# Patient Record
Sex: Male | Born: 1945
Health system: Southern US, Community
[De-identification: ages and names within clinical notes are randomized; demographics above are authoritative.]

## PROBLEM LIST (undated history)

## (undated) DIAGNOSIS — IMO0001 Reserved for inherently not codable concepts without codable children: Secondary | ICD-10-CM

## (undated) DIAGNOSIS — R740 Nonspecific elevation of levels of transaminase and lactic acid dehydrogenase [LDH]: Secondary | ICD-10-CM

## (undated) DIAGNOSIS — E119 Type 2 diabetes mellitus without complications: Secondary | ICD-10-CM

## (undated) DIAGNOSIS — N184 Chronic kidney disease, stage 4 (severe): Secondary | ICD-10-CM

## (undated) DIAGNOSIS — Z794 Long term (current) use of insulin: Secondary | ICD-10-CM

## (undated) DIAGNOSIS — E871 Hypo-osmolality and hyponatremia: Secondary | ICD-10-CM

## (undated) DIAGNOSIS — N179 Acute kidney failure, unspecified: Secondary | ICD-10-CM

## (undated) DIAGNOSIS — S42293A Other displaced fracture of upper end of unspecified humerus, initial encounter for closed fracture: Secondary | ICD-10-CM

## (undated) DIAGNOSIS — R4182 Altered mental status, unspecified: Secondary | ICD-10-CM

## (undated) DIAGNOSIS — I219 Acute myocardial infarction, unspecified: Secondary | ICD-10-CM

## (undated) DIAGNOSIS — E278 Other specified disorders of adrenal gland: Secondary | ICD-10-CM

## (undated) DIAGNOSIS — I255 Ischemic cardiomyopathy: Secondary | ICD-10-CM

## (undated) DIAGNOSIS — N189 Chronic kidney disease, unspecified: Secondary | ICD-10-CM

## (undated) DIAGNOSIS — L0390 Cellulitis, unspecified: Secondary | ICD-10-CM

## (undated) DIAGNOSIS — E1165 Type 2 diabetes mellitus with hyperglycemia: Secondary | ICD-10-CM

## (undated) DIAGNOSIS — Z9861 Coronary angioplasty status: Secondary | ICD-10-CM

## (undated) DIAGNOSIS — I2510 Atherosclerotic heart disease of native coronary artery without angina pectoris: Secondary | ICD-10-CM

## (undated) DIAGNOSIS — I10 Essential (primary) hypertension: Secondary | ICD-10-CM

## (undated) DIAGNOSIS — E1129 Type 2 diabetes mellitus with other diabetic kidney complication: Secondary | ICD-10-CM

## (undated) DIAGNOSIS — E785 Hyperlipidemia, unspecified: Secondary | ICD-10-CM

## (undated) DIAGNOSIS — C801 Malignant (primary) neoplasm, unspecified: Secondary | ICD-10-CM

## (undated) DIAGNOSIS — D638 Anemia in other chronic diseases classified elsewhere: Secondary | ICD-10-CM

## (undated) DIAGNOSIS — L03115 Cellulitis of right lower limb: Secondary | ICD-10-CM

## (undated) DIAGNOSIS — I639 Cerebral infarction, unspecified: Secondary | ICD-10-CM

## (undated) DIAGNOSIS — I5042 Chronic combined systolic (congestive) and diastolic (congestive) heart failure: Secondary | ICD-10-CM

## (undated) HISTORY — DX: Hypo-osmolality and hyponatremia: E87.1

## (undated) HISTORY — DX: Chronic kidney disease, stage 4 (severe): N18.4

## (undated) HISTORY — DX: Acute kidney failure, unspecified: N17.9

## (undated) HISTORY — DX: Acute myocardial infarction, unspecified: I21.9

## (undated) HISTORY — DX: Other displaced fracture of upper end of unspecified humerus, initial encounter for closed fracture: S42.293A

## (undated) HISTORY — DX: Ischemic cardiomyopathy: I25.5

## (undated) HISTORY — DX: Chronic combined systolic (congestive) and diastolic (congestive) heart failure: I50.42

## (undated) HISTORY — DX: Atherosclerotic heart disease of native coronary artery without angina pectoris: I25.10

## (undated) HISTORY — DX: Nonspecific elevation of levels of transaminase and lactic acid dehydrogenase (ldh): R74.0

## (undated) HISTORY — DX: Anemia in other chronic diseases classified elsewhere: D63.8

## (undated) HISTORY — DX: Coronary angioplasty status: Z98.61

## (undated) HISTORY — PX: SKIN CANCER EXCISION: SHX779

## (undated) HISTORY — DX: Type 2 diabetes mellitus with hyperglycemia: E11.65

## (undated) HISTORY — DX: Altered mental status, unspecified: R41.82

## (undated) HISTORY — DX: Chronic kidney disease, unspecified: N18.9

## (undated) HISTORY — DX: Essential (primary) hypertension: I10

---

## 2013-01-21 ENCOUNTER — Encounter (HOSPITAL_COMMUNITY): Payer: Self-pay | Admitting: Emergency Medicine

## 2013-01-21 ENCOUNTER — Emergency Department (HOSPITAL_COMMUNITY): Payer: Medicare Other

## 2013-01-21 ENCOUNTER — Emergency Department (HOSPITAL_COMMUNITY)
Admission: EM | Admit: 2013-01-21 | Discharge: 2013-01-21 | Disposition: A | Discharge status: Home / Self Care | Payer: Medicare Other | Attending: Emergency Medicine | Admitting: Emergency Medicine

## 2013-01-21 DIAGNOSIS — Z794 Long term (current) use of insulin: Secondary | ICD-10-CM | POA: Insufficient documentation

## 2013-01-21 DIAGNOSIS — R296 Repeated falls: Secondary | ICD-10-CM | POA: Insufficient documentation

## 2013-01-21 DIAGNOSIS — S42201A Unspecified fracture of upper end of right humerus, initial encounter for closed fracture: Secondary | ICD-10-CM

## 2013-01-21 DIAGNOSIS — Y929 Unspecified place or not applicable: Secondary | ICD-10-CM | POA: Insufficient documentation

## 2013-01-21 DIAGNOSIS — Z79899 Other long term (current) drug therapy: Secondary | ICD-10-CM | POA: Insufficient documentation

## 2013-01-21 DIAGNOSIS — I10 Essential (primary) hypertension: Secondary | ICD-10-CM | POA: Insufficient documentation

## 2013-01-21 DIAGNOSIS — I252 Old myocardial infarction: Secondary | ICD-10-CM | POA: Insufficient documentation

## 2013-01-21 DIAGNOSIS — Z859 Personal history of malignant neoplasm, unspecified: Secondary | ICD-10-CM | POA: Insufficient documentation

## 2013-01-21 DIAGNOSIS — Y939 Activity, unspecified: Secondary | ICD-10-CM | POA: Insufficient documentation

## 2013-01-21 DIAGNOSIS — Z7982 Long term (current) use of aspirin: Secondary | ICD-10-CM | POA: Insufficient documentation

## 2013-01-21 DIAGNOSIS — E119 Type 2 diabetes mellitus without complications: Secondary | ICD-10-CM | POA: Insufficient documentation

## 2013-01-21 DIAGNOSIS — Z8673 Personal history of transient ischemic attack (TIA), and cerebral infarction without residual deficits: Secondary | ICD-10-CM | POA: Insufficient documentation

## 2013-01-21 DIAGNOSIS — S42209A Unspecified fracture of upper end of unspecified humerus, initial encounter for closed fracture: Secondary | ICD-10-CM | POA: Insufficient documentation

## 2013-01-21 HISTORY — DX: Malignant (primary) neoplasm, unspecified: C80.1

## 2013-01-21 HISTORY — DX: Cerebral infarction, unspecified: I63.9

## 2013-01-21 LAB — GLUCOSE, CAPILLARY: Glucose-Capillary: 172 mg/dL — ABNORMAL HIGH (ref 70–99)

## 2013-01-21 MED ORDER — OXYCODONE-ACETAMINOPHEN 5-325 MG PO TABS: 1.0000 | ORAL_TABLET | Freq: Four times a day (QID) | ORAL | Status: DC | PRN

## 2013-01-21 MED ORDER — OXYCODONE-ACETAMINOPHEN 5-325 MG PO TABS
1.0000 | ORAL_TABLET | Freq: Once | ORAL | Status: AC
  Administered 2013-01-21: 1 via ORAL
  Filled 2013-01-21: qty 1

## 2013-01-21 NOTE — ED Notes (Signed)
Per pt, got knocked off porch by dog-landed on right side-right arm, shoulder pain

## 2013-01-21 NOTE — ED Provider Notes (Signed)
CSN: [id number]     Arrival date & time 01/21/13  4696 History   First MD Initiated Contact with Patient 01/21/13 0802     Chief Complaint  Patient presents with  . right shoulder pain    (Consider location/radiation/quality/duration/timing/severity/associated sxs/prior Treatment) HPI Pt presents with c/o right shoulder pain after fall.  He states he was outside on his porch, his dog ran up and was playing and knocked him over. He landed on the right side and now has pain with movement and palpation of anterior shoulder.  Pt states he has prior stroke on right sided and no new weakness.  Denies striking his head.  Denies neck or back pain.  Has had no treatment prior to arrival.  There are no other associated systemic symptoms, there are no other alleviating or modifying factors.   Past Medical History  Diagnosis Date  . Diabetes mellitus without complication   . Hypertension   . Stroke   . MI, old     years ago  . Cancer    History reviewed. No pertinent past surgical history. No family history on file. History  Substance Use Topics  . Smoking status: Never Smoker   . Smokeless tobacco: Not on file  . Alcohol Use: No    Review of Systems ROS reviewed and all otherwise negative except for mentioned in HPI  Allergies  Review of patient's allergies indicates no known allergies.  Home Medications   Current Outpatient Rx  Name  Route  Sig  Dispense  Refill  . ALPRAZolam (XANAX) 0.5 MG tablet   Oral   Take 0.5 mg by mouth 3 (three) times daily as needed for anxiety.         [name] amLODipine (NORVASC) 10 MG tablet   Oral   Take 10 mg by mouth daily.         [name] aspirin EC 81 MG tablet   Oral   Take 81 mg by mouth daily.         [name] atorvastatin (LIPITOR) 80 MG tablet   Oral   Take 80 mg by mouth at bedtime.         . gabapentin (NEURONTIN) 300 MG capsule   Oral   Take 300 mg by mouth 2 (two) times daily.         . hydrochlorothiazide (HYDRODIURIL) 25 MG  tablet   Oral   Take 25 mg by mouth daily.         . insulin aspart (NOVOLOG) 100 UNIT/ML injection   Subcutaneous   Inject 12-20 Units into the skin 3 (three) times daily before meals.         . insulin glargine (LANTUS) 100 UNIT/ML injection   Subcutaneous   Inject 50 Units into the skin 2 (two) times daily.         [name] lisinopril (PRINIVIL,ZESTRIL) 40 MG tablet   Oral   Take 40 mg by mouth daily.         . primidone (MYSOLINE) 50 MG tablet   Oral   Take 100 mg by mouth daily.         [name] oxyCODONE-acetaminophen (PERCOCET/ROXICET) 5-325 MG per tablet   Oral   Take 1-2 tablets by mouth every 6 (six) hours as needed for severe pain.   20 tablet   0    BP 139/53  Pulse 55  Temp(Src) 98.3 F (36.8 C) (Oral)  Resp 16  SpO2 95% Physical Exam Physical Examination: General appearance - alert,  well appearing, and in no distress Mental status - alert, oriented to person, place, and time Eyes - no conjunctival injection, no scleral icterus Mouth - mucous membranes moist, pharynx normal without lesions Chest - clear to auscultation, no wheezes, rales or rhonchi, symmetric air entry Heart - normal rate, regular rhythm, normal S1, S2, no murmurs, rubs, clicks or gallops Abdomen - soft, nontender, nondistended, no masses or organomegaly Neurological - alert, oriented x 3, cranial nerves grossly intact, strength 4/5 in right upper extremity- limited by pain Musculoskeletal - ttp over anterior right shoulder, pain with minimal range of motion, otherwise no joint tenderness, deformity or swelling Extremities - peripheral pulses normal, no pedal edema, no clubbing or cyanosis, right Upper extremity distally NVI Skin - normal coloration and turgor, no rashes  ED Course  Procedures (including critical care time) Labs Review Labs Reviewed  GLUCOSE, CAPILLARY - Abnormal; Notable for the following:    Glucose-Capillary 172 (*)    All other components within normal limits    Imaging Review Dg Shoulder Right  01/21/2013   CLINICAL DATA:  Fall.  Pain.  EXAM: RIGHT SHOULDER - 2+ VIEW  COMPARISON:  None.  FINDINGS: There is a comminuted fracture of the humeral head and neck. There are intra-articular fragments. No fracture of the glenoid, scapula or clavicle.  IMPRESSION: Comminuted fracture of the humeral head and neck with intra-articular fragments.   Electronically Signed   By: Paulina Fusi M.D.   On: 01/21/2013 09:00   Dg Elbow Complete Right  01/21/2013   CLINICAL DATA:  Fall.  Pain.  EXAM: RIGHT ELBOW - COMPLETE 3+ VIEW  COMPARISON:  None.  FINDINGS: No fluid in the joint. No evidence of fracture or other focal lesion.  IMPRESSION: Negative   Electronically Signed   By: Paulina Fusi M.D.   On: 01/21/2013 09:00    EKG Interpretation   None       MDM   1. Proximal humerus fracture, right, closed, initial encounter    Pt presenting with pain in right shoulder after mechanical fall today.  Xray shows proximal humerus fracture- xray images reviewed and interpreted by me as well.  Pt placed in sling, given po pain meds.  Given information for orthopedic f/u.  Discharged with strict return precautions.  Pt agreeable with plan.    Ethelda Chick, MD 01/21/13 [phone]

## 2013-01-24 ENCOUNTER — Inpatient Hospital Stay (HOSPITAL_COMMUNITY)
Admission: EM | Admit: 2013-01-24 | Discharge: 2013-02-01 | DRG: 682 | Disposition: A | Discharge status: Skilled Nursing Facility | Payer: Medicare Other | Attending: Internal Medicine | Admitting: Internal Medicine

## 2013-01-24 ENCOUNTER — Emergency Department (HOSPITAL_COMMUNITY): Payer: Medicare Other

## 2013-01-24 ENCOUNTER — Encounter (HOSPITAL_COMMUNITY): Payer: Self-pay | Admitting: Emergency Medicine

## 2013-01-24 DIAGNOSIS — I129 Hypertensive chronic kidney disease with stage 1 through stage 4 chronic kidney disease, or unspecified chronic kidney disease: Secondary | ICD-10-CM | POA: Diagnosis present

## 2013-01-24 DIAGNOSIS — R7402 Elevation of levels of lactic acid dehydrogenase (LDH): Secondary | ICD-10-CM | POA: Diagnosis present

## 2013-01-24 DIAGNOSIS — E1142 Type 2 diabetes mellitus with diabetic polyneuropathy: Secondary | ICD-10-CM | POA: Diagnosis present

## 2013-01-24 DIAGNOSIS — Z79899 Other long term (current) drug therapy: Secondary | ICD-10-CM

## 2013-01-24 DIAGNOSIS — R7401 Elevation of levels of liver transaminase levels: Secondary | ICD-10-CM | POA: Diagnosis present

## 2013-01-24 DIAGNOSIS — E871 Hypo-osmolality and hyponatremia: Secondary | ICD-10-CM | POA: Diagnosis present

## 2013-01-24 DIAGNOSIS — Z8673 Personal history of transient ischemic attack (TIA), and cerebral infarction without residual deficits: Secondary | ICD-10-CM

## 2013-01-24 DIAGNOSIS — K5900 Constipation, unspecified: Secondary | ICD-10-CM | POA: Diagnosis not present

## 2013-01-24 DIAGNOSIS — E1149 Type 2 diabetes mellitus with other diabetic neurological complication: Secondary | ICD-10-CM | POA: Diagnosis present

## 2013-01-24 DIAGNOSIS — I509 Heart failure, unspecified: Secondary | ICD-10-CM | POA: Diagnosis present

## 2013-01-24 DIAGNOSIS — S42293A Other displaced fracture of upper end of unspecified humerus, initial encounter for closed fracture: Secondary | ICD-10-CM

## 2013-01-24 DIAGNOSIS — I252 Old myocardial infarction: Secondary | ICD-10-CM

## 2013-01-24 DIAGNOSIS — I5033 Acute on chronic diastolic (congestive) heart failure: Secondary | ICD-10-CM | POA: Diagnosis present

## 2013-01-24 DIAGNOSIS — J9600 Acute respiratory failure, unspecified whether with hypoxia or hypercapnia: Secondary | ICD-10-CM | POA: Diagnosis present

## 2013-01-24 DIAGNOSIS — R748 Abnormal levels of other serum enzymes: Secondary | ICD-10-CM

## 2013-01-24 DIAGNOSIS — N184 Chronic kidney disease, stage 4 (severe): Secondary | ICD-10-CM | POA: Diagnosis present

## 2013-01-24 DIAGNOSIS — E669 Obesity, unspecified: Secondary | ICD-10-CM | POA: Diagnosis present

## 2013-01-24 DIAGNOSIS — D631 Anemia in chronic kidney disease: Secondary | ICD-10-CM | POA: Diagnosis present

## 2013-01-24 DIAGNOSIS — E875 Hyperkalemia: Secondary | ICD-10-CM | POA: Diagnosis not present

## 2013-01-24 DIAGNOSIS — R4182 Altered mental status, unspecified: Secondary | ICD-10-CM | POA: Diagnosis present

## 2013-01-24 DIAGNOSIS — D72829 Elevated white blood cell count, unspecified: Secondary | ICD-10-CM | POA: Diagnosis present

## 2013-01-24 DIAGNOSIS — N19 Unspecified kidney failure: Secondary | ICD-10-CM

## 2013-01-24 DIAGNOSIS — R739 Hyperglycemia, unspecified: Secondary | ICD-10-CM

## 2013-01-24 DIAGNOSIS — T4275XA Adverse effect of unspecified antiepileptic and sedative-hypnotic drugs, initial encounter: Secondary | ICD-10-CM | POA: Diagnosis present

## 2013-01-24 DIAGNOSIS — N179 Acute kidney failure, unspecified: Principal | ICD-10-CM | POA: Diagnosis present

## 2013-01-24 DIAGNOSIS — Z794 Long term (current) use of insulin: Secondary | ICD-10-CM

## 2013-01-24 DIAGNOSIS — IMO0001 Reserved for inherently not codable concepts without codable children: Secondary | ICD-10-CM

## 2013-01-24 DIAGNOSIS — D638 Anemia in other chronic diseases classified elsewhere: Secondary | ICD-10-CM

## 2013-01-24 HISTORY — DX: Type 2 diabetes mellitus with other diabetic kidney complication: E11.29

## 2013-01-24 MED ORDER — SODIUM CHLORIDE 0.9 % IV BOLUS (SEPSIS)
1000.0000 mL | Freq: Once | INTRAVENOUS | Status: AC
  Administered 2013-01-25: 1000 mL via INTRAVENOUS

## 2013-01-24 NOTE — ED Notes (Signed)
Pt is alert however pleasantly confused.  Per granddaughter pt is A&O x3 at baseline, at this time pt is disoriented x 3.  Pt has one had 1 5 mg percocet at 10:15an and slept until 1900 when granddaughter woke him up.  Per granddaughter pt was hard to arouse, lethargic and confused.

## 2013-01-24 NOTE — ED Notes (Signed)
Bed: WG95 Expected date: 01/24/13 Expected time: 8:43 PM Means of arrival: Ambulance Comments: Sick/s/p broken humerus

## 2013-01-24 NOTE — ED Notes (Signed)
Pt was treated here Sunday for a broken humerus, he was placed on percocet and family states that he's been lethargic and some altered mental status, He also has some congestion and possibly CHF, O2 sats in the high 80's, placed on 3liters [place] by EMS, sats increased to 96%

## 2013-01-25 ENCOUNTER — Emergency Department (HOSPITAL_COMMUNITY): Payer: Medicare Other

## 2013-01-25 ENCOUNTER — Encounter (HOSPITAL_COMMUNITY): Payer: Self-pay | Admitting: Internal Medicine

## 2013-01-25 ENCOUNTER — Inpatient Hospital Stay (HOSPITAL_COMMUNITY): Payer: Medicare Other

## 2013-01-25 DIAGNOSIS — N179 Acute kidney failure, unspecified: Principal | ICD-10-CM

## 2013-01-25 DIAGNOSIS — R740 Nonspecific elevation of levels of transaminase and lactic acid dehydrogenase [LDH]: Secondary | ICD-10-CM

## 2013-01-25 DIAGNOSIS — R609 Edema, unspecified: Secondary | ICD-10-CM

## 2013-01-25 DIAGNOSIS — R4182 Altered mental status, unspecified: Secondary | ICD-10-CM

## 2013-01-25 DIAGNOSIS — D638 Anemia in other chronic diseases classified elsewhere: Secondary | ICD-10-CM

## 2013-01-25 DIAGNOSIS — R7401 Elevation of levels of liver transaminase levels: Secondary | ICD-10-CM

## 2013-01-25 DIAGNOSIS — N189 Chronic kidney disease, unspecified: Secondary | ICD-10-CM

## 2013-01-25 DIAGNOSIS — R739 Hyperglycemia, unspecified: Secondary | ICD-10-CM | POA: Diagnosis present

## 2013-01-25 DIAGNOSIS — E871 Hypo-osmolality and hyponatremia: Secondary | ICD-10-CM | POA: Diagnosis present

## 2013-01-25 DIAGNOSIS — D72829 Elevated white blood cell count, unspecified: Secondary | ICD-10-CM | POA: Diagnosis present

## 2013-01-25 HISTORY — DX: Elevation of levels of liver transaminase levels: R74.01

## 2013-01-25 HISTORY — DX: Hypo-osmolality and hyponatremia: E87.1

## 2013-01-25 HISTORY — DX: Anemia in other chronic diseases classified elsewhere: D63.8

## 2013-01-25 HISTORY — DX: Altered mental status, unspecified: R41.82

## 2013-01-25 LAB — LIPID PANEL
CHOL/HDL RATIO: 2.8 ratio
Cholesterol: 123 mg/dL (ref 0–200)
HDL: 44 mg/dL (ref >39)
LDL Cholesterol: 63 mg/dL (ref 0–99)
Triglycerides: 80 mg/dL (ref <150)
VLDL: 16 mg/dL (ref 0–40)

## 2013-01-25 LAB — COMPREHENSIVE METABOLIC PANEL
ALBUMIN: 2.8 g/dL — AB (ref 3.5–5.2)
ALBUMIN: 2.8 g/dL — AB (ref 3.5–5.2)
ALK PHOS: 176 U/L — AB (ref 39–117)
ALT: 59 U/L — ABNORMAL HIGH (ref 0–53)
ALT: 81 U/L — AB (ref 0–53)
ALT: 81 U/L — ABNORMAL HIGH (ref 0–53)
AST: 66 U/L — ABNORMAL HIGH (ref 0–37)
AST: 84 U/L — AB (ref 0–37)
AST: 84 U/L — ABNORMAL HIGH (ref 0–37)
Albumin: 2.9 g/dL — ABNORMAL LOW (ref 3.5–5.2)
Alkaline Phosphatase: 145 U/L — ABNORMAL HIGH (ref 39–117)
Alkaline Phosphatase: 170 U/L — ABNORMAL HIGH (ref 39–117)
BILIRUBIN TOTAL: 1.2 mg/dL (ref 0.3–1.2)
BUN: 83 mg/dL — AB (ref 6–23)
BUN: 84 mg/dL — ABNORMAL HIGH (ref 6–23)
BUN: 85 mg/dL — ABNORMAL HIGH (ref 6–23)
CHLORIDE: 96 meq/L (ref 96–112)
CO2: 20 mEq/L (ref 19–32)
CO2: 20 meq/L (ref 19–32)
CO2: 21 mEq/L (ref 19–32)
CREATININE: 3.9 mg/dL — AB (ref 0.50–1.35)
CREATININE: 3.96 mg/dL — AB (ref 0.50–1.35)
Calcium: 8.4 mg/dL (ref 8.4–10.5)
Calcium: 8.6 mg/dL (ref 8.4–10.5)
Calcium: 8.6 mg/dL (ref 8.4–10.5)
Chloride: 91 mEq/L — ABNORMAL LOW (ref 96–112)
Chloride: 97 mEq/L (ref 96–112)
Creatinine, Ser: 4.31 mg/dL — ABNORMAL HIGH (ref 0.50–1.35)
GFR calc Af Amer: 15 mL/min — ABNORMAL LOW (ref >90)
GFR calc Af Amer: 16 mL/min — ABNORMAL LOW (ref >90)
GFR calc Af Amer: 17 mL/min — ABNORMAL LOW (ref >90)
GFR calc non Af Amer: 13 mL/min — ABNORMAL LOW (ref >90)
GFR calc non Af Amer: 14 mL/min — ABNORMAL LOW (ref >90)
GFR calc non Af Amer: 14 mL/min — ABNORMAL LOW (ref >90)
Glucose, Bld: 114 mg/dL — ABNORMAL HIGH (ref 70–99)
Glucose, Bld: 115 mg/dL — ABNORMAL HIGH (ref 70–99)
Glucose, Bld: 157 mg/dL — ABNORMAL HIGH (ref 70–99)
POTASSIUM: 5.1 meq/L (ref 3.7–5.3)
Potassium: 5.2 mEq/L (ref 3.7–5.3)
Potassium: 5.2 mEq/L (ref 3.7–5.3)
SODIUM: 131 meq/L — AB (ref 137–147)
Sodium: 127 mEq/L — ABNORMAL LOW (ref 137–147)
Sodium: 134 mEq/L — ABNORMAL LOW (ref 137–147)
TOTAL PROTEIN: 6.2 g/dL (ref 6.0–8.3)
Total Bilirubin: 0.9 mg/dL (ref 0.3–1.2)
Total Bilirubin: 1.2 mg/dL (ref 0.3–1.2)
Total Protein: 6.3 g/dL (ref 6.0–8.3)
Total Protein: 6.5 g/dL (ref 6.0–8.3)

## 2013-01-25 LAB — CBC
HCT: 28.5 % — ABNORMAL LOW (ref 39.0–52.0)
HCT: 31.9 % — ABNORMAL LOW (ref 39.0–52.0)
Hemoglobin: 10.4 g/dL — ABNORMAL LOW (ref 13.0–17.0)
Hemoglobin: 9.4 g/dL — ABNORMAL LOW (ref 13.0–17.0)
MCH: 28.9 pg (ref 26.0–34.0)
MCH: 29.3 pg (ref 26.0–34.0)
MCHC: 32.6 g/dL (ref 30.0–36.0)
MCHC: 33 g/dL (ref 30.0–36.0)
MCV: 88.6 fL (ref 78.0–100.0)
MCV: 88.8 fL (ref 78.0–100.0)
PLATELETS: 277 10*3/uL (ref 150–400)
Platelets: 272 10*3/uL (ref 150–400)
RBC: 3.21 MIL/uL — AB (ref 4.22–5.81)
RBC: 3.6 MIL/uL — ABNORMAL LOW (ref 4.22–5.81)
RDW: 13.9 % (ref 11.5–15.5)
RDW: 14 % (ref 11.5–15.5)
WBC: 12.3 10*3/uL — ABNORMAL HIGH (ref 4.0–10.5)
WBC: 12.7 10*3/uL — ABNORMAL HIGH (ref 4.0–10.5)

## 2013-01-25 LAB — HEMOGLOBIN A1C
Hgb A1c MFr Bld: 8.9 % — ABNORMAL HIGH (ref <5.7)
Mean Plasma Glucose: 209 mg/dL — ABNORMAL HIGH (ref <117)

## 2013-01-25 LAB — GLUCOSE, CAPILLARY
GLUCOSE-CAPILLARY: 106 mg/dL — AB (ref 70–99)
GLUCOSE-CAPILLARY: 265 mg/dL — AB (ref 70–99)
Glucose-Capillary: 156 mg/dL — ABNORMAL HIGH (ref 70–99)
Glucose-Capillary: 214 mg/dL — ABNORMAL HIGH (ref 70–99)

## 2013-01-25 LAB — URINALYSIS, ROUTINE W REFLEX MICROSCOPIC
Bilirubin Urine: NEGATIVE
Glucose, UA: NEGATIVE mg/dL
Ketones, ur: NEGATIVE mg/dL
Leukocytes, UA: NEGATIVE
Nitrite: NEGATIVE
Protein, ur: 100 mg/dL — AB
Specific Gravity, Urine: 1.011 (ref 1.005–1.030)
Urobilinogen, UA: 1 mg/dL (ref 0.0–1.0)
pH: 5 (ref 5.0–8.0)

## 2013-01-25 LAB — TROPONIN I

## 2013-01-25 LAB — PROTEIN / CREATININE RATIO, URINE
Creatinine, Urine: 74.95 mg/dL
Protein Creatinine Ratio: 1.05 — ABNORMAL HIGH (ref 0.00–0.15)
Total Protein, Urine: 78.4 mg/dL

## 2013-01-25 LAB — RAPID URINE DRUG SCREEN, HOSP PERFORMED
Amphetamines: NOT DETECTED
BENZODIAZEPINES: NOT DETECTED
Barbiturates: POSITIVE — AB
Cocaine: NOT DETECTED
OPIATES: NOT DETECTED
Tetrahydrocannabinol: NOT DETECTED

## 2013-01-25 LAB — T3, FREE: T3 FREE: 2 pg/mL — AB (ref 2.3–4.2)

## 2013-01-25 LAB — URINE MICROSCOPIC-ADD ON

## 2013-01-25 LAB — PRO B NATRIURETIC PEPTIDE
Pro B Natriuretic peptide (BNP): 655.6 pg/mL — ABNORMAL HIGH (ref 0–125)
Pro B Natriuretic peptide (BNP): 785 pg/mL — ABNORMAL HIGH (ref 0–125)

## 2013-01-25 LAB — SODIUM, URINE, RANDOM: SODIUM UR: 26 meq/L

## 2013-01-25 LAB — MAGNESIUM: MAGNESIUM: 2.4 mg/dL (ref 1.5–2.5)

## 2013-01-25 LAB — TSH: TSH: 0.305 u[IU]/mL — AB (ref 0.350–4.500)

## 2013-01-25 LAB — PHOSPHORUS: PHOSPHORUS: 6.7 mg/dL — AB (ref 2.3–4.6)

## 2013-01-25 LAB — CREATININE, URINE, RANDOM: CREATININE, URINE: 103.6 mg/dL

## 2013-01-25 LAB — ETHANOL

## 2013-01-25 LAB — T4, FREE: Free T4: 1.06 ng/dL (ref 0.80–1.80)

## 2013-01-25 LAB — D-DIMER, QUANTITATIVE (NOT AT ARMC): D-Dimer, Quant: 0.93 ug/mL-FEU — ABNORMAL HIGH (ref 0.00–0.48)

## 2013-01-25 MED ORDER — SODIUM CHLORIDE 0.9 % IV SOLN
INTRAVENOUS | Status: DC
Start: 2013-01-25 — End: 2013-01-31
  Administered 2013-01-31: 16:00:00 20 mL/h via INTRAVENOUS

## 2013-01-25 MED ORDER — AMLODIPINE BESYLATE 10 MG PO TABS
10.0000 mg | ORAL_TABLET | Freq: Every day | ORAL | Status: DC
  Administered 2013-01-25 – 2013-02-01 (×8): 10 mg via ORAL
  Filled 2013-01-25 (×8): qty 1

## 2013-01-25 MED ORDER — INSULIN ASPART 100 UNIT/ML ~~LOC~~ SOLN: 0.0000 [IU] | Freq: Three times a day (TID) | SUBCUTANEOUS | Status: DC

## 2013-01-25 MED ORDER — SODIUM CHLORIDE 0.9 % IJ SOLN
3.0000 mL | Freq: Two times a day (BID) | INTRAMUSCULAR | Status: DC
  Administered 2013-01-26 – 2013-02-01 (×8): 3 mL via INTRAVENOUS

## 2013-01-25 MED ORDER — SEVELAMER CARBONATE 800 MG PO TABS
800.0000 mg | ORAL_TABLET | Freq: Three times a day (TID) | ORAL | Status: DC
  Administered 2013-01-25 – 2013-02-01 (×20): 800 mg via ORAL
  Filled 2013-01-25 (×25): qty 1

## 2013-01-25 MED ORDER — ONDANSETRON HCL 4 MG PO TABS
4.0000 mg | ORAL_TABLET | Freq: Four times a day (QID) | ORAL | Status: DC | PRN
  Administered 2013-01-28 – 2013-02-01 (×2): 4 mg via ORAL
  Filled 2013-01-25 (×2): qty 1

## 2013-01-25 MED ORDER — TRAMADOL HCL 50 MG PO TABS: 100.0000 mg | ORAL_TABLET | Freq: Four times a day (QID) | ORAL | Status: DC | PRN

## 2013-01-25 MED ORDER — ASPIRIN EC 81 MG PO TBEC
81.0000 mg | DELAYED_RELEASE_TABLET | Freq: Every day | ORAL | Status: DC
  Administered 2013-01-25: 81 mg via ORAL
  Filled 2013-01-25: qty 1

## 2013-01-25 MED ORDER — ACETAMINOPHEN 325 MG PO TABS
650.0000 mg | ORAL_TABLET | Freq: Four times a day (QID) | ORAL | Status: DC | PRN
  Administered 2013-01-25 – 2013-02-01 (×6): 650 mg via ORAL
  Filled 2013-01-25 (×6): qty 2

## 2013-01-25 MED ORDER — INSULIN ASPART 100 UNIT/ML ~~LOC~~ SOLN
0.0000 [IU] | Freq: Every day | SUBCUTANEOUS | Status: DC
Start: 2013-01-25 — End: 2013-02-01
  Administered 2013-01-25 – 2013-01-29 (×2): 3 [IU] via SUBCUTANEOUS

## 2013-01-25 MED ORDER — INSULIN ASPART 100 UNIT/ML ~~LOC~~ SOLN: 12.0000 [IU] | Freq: Three times a day (TID) | SUBCUTANEOUS | Status: DC

## 2013-01-25 MED ORDER — HYDROCODONE-ACETAMINOPHEN 5-325 MG PO TABS: 1.0000 | ORAL_TABLET | ORAL | Status: DC | PRN

## 2013-01-25 MED ORDER — INSULIN GLARGINE 100 UNIT/ML ~~LOC~~ SOLN
50.0000 [IU] | Freq: Two times a day (BID) | SUBCUTANEOUS | Status: DC
  Filled 2013-01-25: qty 0.5

## 2013-01-25 MED ORDER — HEPARIN SODIUM (PORCINE) 5000 UNIT/ML IJ SOLN
5000.0000 [IU] | Freq: Three times a day (TID) | INTRAMUSCULAR | Status: DC
Start: 2013-01-25 — End: 2013-02-01
  Administered 2013-01-25 – 2013-02-01 (×22): 5000 [IU] via SUBCUTANEOUS
  Filled 2013-01-25 (×25): qty 1

## 2013-01-25 MED ORDER — ASPIRIN EC 325 MG PO TBEC
325.0000 mg | DELAYED_RELEASE_TABLET | Freq: Every day | ORAL | Status: DC
  Administered 2013-01-26 – 2013-02-01 (×7): 325 mg via ORAL
  Filled 2013-01-25 (×7): qty 1

## 2013-01-25 MED ORDER — TRAMADOL HCL 50 MG PO TABS
50.0000 mg | ORAL_TABLET | Freq: Two times a day (BID) | ORAL | Status: DC | PRN
  Administered 2013-01-26: 50 mg via ORAL
  Filled 2013-01-25: qty 1

## 2013-01-25 MED ORDER — SODIUM CHLORIDE 0.9 % IV SOLN
INTRAVENOUS | Status: DC
  Administered 2013-01-25: 06:00:00 via INTRAVENOUS

## 2013-01-25 MED ORDER — FUROSEMIDE 10 MG/ML IJ SOLN
100.0000 mg | Freq: Three times a day (TID) | INTRAVENOUS | Status: DC
  Administered 2013-01-25 – 2013-01-27 (×6): 100 mg via INTRAVENOUS
  Filled 2013-01-25 (×8): qty 10

## 2013-01-25 MED ORDER — INSULIN ASPART 100 UNIT/ML ~~LOC~~ SOLN
0.0000 [IU] | Freq: Three times a day (TID) | SUBCUTANEOUS | Status: DC
  Administered 2013-01-25: 5 [IU] via SUBCUTANEOUS
  Administered 2013-01-25 – 2013-01-27 (×6): 3 [IU] via SUBCUTANEOUS
  Administered 2013-01-27: 13:00:00 5 [IU] via SUBCUTANEOUS
  Administered 2013-01-28 (×2): 3 [IU] via SUBCUTANEOUS
  Administered 2013-01-28 – 2013-01-29 (×2): 5 [IU] via SUBCUTANEOUS
  Administered 2013-01-29: 10:00:00 3 [IU] via SUBCUTANEOUS
  Administered 2013-01-29: 17:00:00 5 [IU] via SUBCUTANEOUS
  Administered 2013-01-30 (×2): 3 [IU] via SUBCUTANEOUS
  Administered 2013-01-30: 08:00:00 2 [IU] via SUBCUTANEOUS
  Administered 2013-01-31 – 2013-02-01 (×2): 3 [IU] via SUBCUTANEOUS

## 2013-01-25 MED ORDER — PRIMIDONE 50 MG PO TABS
100.0000 mg | ORAL_TABLET | Freq: Every day | ORAL | Status: DC
  Administered 2013-01-25 – 2013-02-01 (×8): 100 mg via ORAL
  Filled 2013-01-25 (×9): qty 2

## 2013-01-25 MED ORDER — ONDANSETRON HCL 4 MG/2ML IJ SOLN
4.0000 mg | Freq: Four times a day (QID) | INTRAMUSCULAR | Status: DC | PRN
  Administered 2013-01-30: 09:00:00 4 mg via INTRAVENOUS
  Filled 2013-01-25 (×2): qty 2

## 2013-01-25 MED ORDER — LISINOPRIL 10 MG PO TABS
10.0000 mg | ORAL_TABLET | Freq: Every day | ORAL | Status: DC
  Administered 2013-01-25 – 2013-01-28 (×4): 10 mg via ORAL
  Filled 2013-01-25 (×5): qty 1

## 2013-01-25 MED ORDER — INSULIN GLARGINE 100 UNIT/ML ~~LOC~~ SOLN
35.0000 [IU] | Freq: Two times a day (BID) | SUBCUTANEOUS | Status: DC
  Administered 2013-01-25 (×2): 35 [IU] via SUBCUTANEOUS
  Filled 2013-01-25 (×4): qty 0.35

## 2013-01-25 MED ORDER — ATORVASTATIN CALCIUM 80 MG PO TABS
80.0000 mg | ORAL_TABLET | Freq: Every day | ORAL | Status: DC
  Administered 2013-01-25 – 2013-02-01 (×7): 80 mg via ORAL
  Filled 2013-01-25 (×10): qty 1

## 2013-01-25 NOTE — Progress Notes (Signed)
[facility] PROGRESS NOTE  Mitchell Garcia PZW:[id number] DOB: Jun 01, 1945 DOA: 01/24/2013 PCP: No primary provider on file.  Assessment/Plan  Altered mental status, likely secondary to lingering effects of narcotics and gabapentin and mostly resolved this morning.   -  UDS positive for barbiturates due to primidone -  EtOH neg -  TSH 0.305, adding fT4 and fT3 -  D/c narcotics -  Start renally dosed ultram for severe pain   Acute hypoxic respiratory failure, may be due to hypervolemia from CKD, however, CXR not interpreted as consistent with pulmonary edema or vascular congestion.  Per family, he has history of CHF also -  D-dimer elevated, VQ pending -  Pro-BNP mildly elevated (may be falsely low in this obese male) -  D/c IVF now that patient awake -  IS -  OOB -  Wean oxygen as tolerated -  Attempt to get ECHO and cardiology reports from [place] on chronic kidney disease with unknown baseline, but per family, has been told he may eventually need HD.  Likely secondary to diabetes.   -  Fax to get records from [place] about nephrology appointments, ultrasounds, and electrolytes from last year -  Nephrology consult given rising potassium and hypervolemic state -  Will hold on further fluids as patient has lower extremity edema already -  FENa:  0.75% -  RUS:  normal  CHF with lower extremity edema and elevated BNP -  ECHO -  Records from [place] -  Continue CCB -  Holding ACEI due AKI  Increased bilateral lower extremity edema, ddx includes CHF, AoCKD, DVT particularly after being sedentary last few days -  BLE venous Duplex   HTN, previously on norvasc, HCTZ, lisinopril -  Agree with holding ACEI due to CKD -  HCTZ not effective given CrCl -  Consider lasix -  Continue norvasc  Diabetes mellitus, uncontrolled with renal manifestations -  A1c  -  Reduce long acting insulin this AM due to low normal CBG and unclear if he will eat well today -  If CBG  rising, will resume home dose tomorrow -  D/c standing meal insulin for same reason and use SSI today  Hx of CVA -  Increase to FD ASA  -  Risk factor modification   Leukocytosis without fever.  UA neg, CXR without acute disease.  May be mildly elevated from recent humerus fracture. -  Trend WBC  Normocytic anemia, likely secondary to renal parenchymal disease -  Add iron panel, B12, folate, SPEP/UPEP/IFE -  F/u TFTs  Hyponatremia may be due to fluid retention  -  Consider trial of lasix but will defer to nephrology  Transaminitis, may be due to vascular congestion/hypervolemia or to NASH.  No history of EtOH abuse per family.  Generalized weakness in setting of right humerus fracture -  PT/OT consults  Diet:  renal Access:  PIV IVF:  off Proph:  heparin  Code Status: full Family Communication: patient and his wife and daughter Disposition Plan: possible transfer to [facility] vs. Ongoing care at [place], family discussing.     Consultants:  Nephrology, Dr. Jonnie Finner  Procedures:  RUS  VQ pending  CXR   CT head  Antibiotics:  none   HPI/Subjective:  Feels more alert today.  Right arm painful.  Does not remember the last several days.  States his wife and daughter were bringing him food and water and his medications.    Objective: Filed Vitals:   01/24/13 2106  01/25/13 0216 01/25/13 0400 01/25/13 0450  BP: 107/53 127/51 117/51 152/64  Pulse: 92 91 75 89  Temp: 99.6 F (37.6 C)   98.9 F (37.2 C)  TempSrc: Oral   Oral  Resp: 18 20 18 16   Height:    6' (1.829 m)  Weight:    124.8 kg (275 lb 2.2 oz)  SpO2: 85% 100% 99% 98%    Intake/Output Summary (Last 24 hours) at 01/25/13 1244 Last data filed at 01/25/13 0900  Gross per 24 hour  Intake    360 ml  Output    550 ml  Net   -190 ml   Filed Weights   01/25/13 0450  Weight: 124.8 kg (275 lb 2.2 oz)    Exam:   General:  Obese CM, No acute distress  HEENT:  NCAT, MMM  Cardiovascular:  Distant  heart sounds regular rhythm, no obvious murmurs, 2+ pulses, warm extremities  Respiratory:  Diminished bilateral breath sounds with some rales at deep bases that cleared somewhat, no wheezes, no increased WOB  Abdomen:   NABS, soft, NT/ND  MSK:   Normal tone and bulk, right arm ecchymotic from neck to hand, in sling, grip 5-/5, 2+ pulse, 1+ soft pitting bilateral LEE  Neuro:  Grossly intact  Data Reviewed: Basic Metabolic Panel:  Recent Labs Lab 01/24/13 2350 01/25/13 0615  NA 127* 134*  131*  K 5.2 5.2  5.1  CL 91* 97  96  CO2 20 21  20   GLUCOSE 157* 114*  115*  BUN 84* 83*  85*  CREATININE 4.31* 3.90*  3.96*  CALCIUM 8.6 8.6  8.4  MG  --  2.4  PHOS  --  6.7*   Liver Function Tests:  Recent Labs Lab 01/24/13 2350 01/25/13 0615  AST 66* 84*  84*  ALT 59* 81*  81*  ALKPHOS 145* 170*  176*  BILITOT 0.9 1.2  1.2  PROT 6.5 6.2  6.3  ALBUMIN 2.9* 2.8*  2.8*   No results found for this basename: LIPASE, AMYLASE,  in the last 168 hours No results found for this basename: AMMONIA,  in the last 168 hours CBC:  Recent Labs Lab 01/24/13 2350 01/25/13 0615  WBC 12.3* 12.7*  HGB 10.4* 9.4*  HCT 31.9* 28.5*  MCV 88.6 88.8  PLT 272 277   Cardiac Enzymes:  Recent Labs Lab 01/25/13 0615  TROPONINI <0.30   BNP (last 3 results)  Recent Labs  01/24/13 2350 01/25/13 0615  PROBNP 655.6* 785.0*   CBG:  Recent Labs Lab 01/21/13 0806 01/25/13 0742 01/25/13 1220  GLUCAP 172* 106* 156*    No results found for this or any previous visit (from the past 240 hour(s)).   Studies: Dg Chest 2 View  01/25/2013   CLINICAL DATA:  Altered mental status.  Shortness of breath.  EXAM: CHEST  2 VIEW  COMPARISON:  None.  FINDINGS: Shallow inspiration. The heart size and mediastinal contours are within normal limits. Both lungs are clear. The visualized skeletal structures are unremarkable.  IMPRESSION: No active cardiopulmonary disease.   Electronically Signed    By: Lucienne Capers M.D.   On: 01/25/2013 00:19   Ct Head Wo Contrast  01/25/2013   CLINICAL DATA:  Lethargic and confused. History of CVA. Hypertension and diabetes. Altered mental status.  EXAM: CT HEAD WITHOUT CONTRAST  TECHNIQUE: Contiguous axial images were obtained from the base of the skull through the vertex without intravenous contrast.  COMPARISON:  None.  FINDINGS: Sinuses/Soft  tissues: Mucosal thickening of the left maxillary sinus. Near complete opacification of the right maxillary sinus. Clear mastoid air cells.  Intracranial: No mass lesion, hemorrhage, hydrocephalus, acute infarct, intra-axial, or extra-axial fluid collection.  IMPRESSION: 1.  No acute intracranial abnormality. 2. Sinus disease.   Electronically Signed   By: Abigail Miyamoto M.D.   On: 01/25/2013 02:02   US Renal  01/25/2013   CLINICAL DATA:  Acute on chronic kidney failure  EXAM: RENAL/URINARY TRACT ULTRASOUND COMPLETE  COMPARISON:  None.  FINDINGS: Right Kidney:  Length: 12 cm. Echogenicity within normal limits. No mass or hydronephrosis visualized.  Left Kidney:  Length: 12.1 cm. Echogenicity within normal limits. No mass or hydronephrosis visualized.  Bladder:  Appears normal for degree of bladder distention.  IMPRESSION: Normal renal ultrasound.   Electronically Signed   By: Kathreen Devoid   On: 01/25/2013 10:19    Scheduled Meds: . amLODipine  10 mg Oral Daily  . aspirin EC  81 mg Oral Daily  . atorvastatin  80 mg Oral QHS  . heparin  5,000 Units Subcutaneous Q8H  . insulin aspart  0-15 Units Subcutaneous TID WC  . insulin aspart  0-5 Units Subcutaneous QHS  . insulin glargine  35 Units Subcutaneous BID  . primidone  100 mg Oral Daily  . sodium chloride  3 mL Intravenous Q12H   Continuous Infusions: . sodium chloride      Principal Problem:   Altered mental state Active Problems:   Acute on chronic kidney failure   Transaminitis   Leukocytosis   Anemia of chronic disease   Hyponatremia    Hyperglycemia    Time spent: 30 min    Aastha Dayley, [facility]  [facility] Pager [phone]. If 7PM-7AM, please contact night-coverage at [email or web address], password [facility] 01/25/2013, 12:44 PM  LOS: 1 day

## 2013-01-25 NOTE — ED Provider Notes (Signed)
CSN: [id number]     Arrival date & time 01/24/13  2057 History   First MD Initiated Contact with Patient 01/25/13 0007     Chief Complaint  Patient presents with  . Altered Mental Status   (Consider location/radiation/quality/duration/timing/severity/associated sxs/prior Treatment) Patient is a 68 y.o. male presenting with altered mental status. The history is provided by the patient and a relative.  Altered Mental Status He was in the ED 4 days ago following a fall with a.m. fracture of the right humerus and was discharged home with prescription for oxycodone-acetaminophen. Today, he was noted to be lethargic and confused. He has only been taking a few oxycodone acetaminophen tablets a day. There's been no fever or chills. There's been no vomiting or diarrhea. He has not been on any other new medications.  Past Medical History  Diagnosis Date  . Diabetes mellitus without complication   . Hypertension   . Stroke   . MI, old     years ago  . Cancer    History reviewed. No pertinent past surgical history. History reviewed. No pertinent family history. History  Substance Use Topics  . Smoking status: Never Smoker   . Smokeless tobacco: Not on file  . Alcohol Use: No    Review of Systems  All other systems reviewed and are negative.    Allergies  Review of patient's allergies indicates no known allergies.  Home Medications   Current Outpatient Rx  Name  Route  Sig  Dispense  Refill  . ALPRAZolam (XANAX) 0.5 MG tablet   Oral   Take 0.5 mg by mouth at bedtime as needed for anxiety.          [name] amLODipine (NORVASC) 10 MG tablet   Oral   Take 10 mg by mouth daily.         [name] aspirin EC 81 MG tablet   Oral   Take 81 mg by mouth daily.         [name] atorvastatin (LIPITOR) 80 MG tablet   Oral   Take 80 mg by mouth at bedtime.         . gabapentin (NEURONTIN) 300 MG capsule   Oral   Take 300 mg by mouth 2 (two) times daily.         . hydrochlorothiazide  (HYDRODIURIL) 25 MG tablet   Oral   Take 25 mg by mouth daily.         . insulin aspart (NOVOLOG) 100 UNIT/ML injection   Subcutaneous   Inject 12-20 Units into the skin 3 (three) times daily before meals. Patient used 4 units this morning         . insulin glargine (LANTUS) 100 UNIT/ML injection   Subcutaneous   Inject 50 Units into the skin 2 (two) times daily.         [name] lisinopril (PRINIVIL,ZESTRIL) 40 MG tablet   Oral   Take 40 mg by mouth daily.         [name] oxyCODONE-acetaminophen (PERCOCET/ROXICET) 5-325 MG per tablet   Oral   Take 1-2 tablets by mouth every 6 (six) hours as needed for severe pain.   20 tablet   0   . primidone (MYSOLINE) 50 MG tablet   Oral   Take 100 mg by mouth daily.          BP 107/53  Pulse 92  Temp(Src) 99.6 F (37.6 C) (Oral)  Resp 18  SpO2 85% Physical Exam  Nursing note and vitals reviewed.  68 year old male, resting comfortably and in no acute distress. Vital signs are normal. Oxygen saturation is 85%, which is hypoxic. Head is normocephalic and atraumatic. PERRLA, EOMI. Oropharynx is clear. Neck is nontender and supple without adenopathy or JVD. Back is nontender and there is no CVA tenderness. Lungs are clear without rales, wheezes, or rhonchi. Chest is nontender. Heart has regular rate and rhythm without murmur. Abdomen is soft, flat, nontender without masses or hepatosplenomegaly and peristalsis is normoactive. Extremities: Right arm is in a sling and there is ecchymosis and soft tissue swelling with a right upper arm extending to the right side of the chest. There is pain with any movement or palpation of the right upper arm. He as to 3+ pitting pretibial edema bilaterally.. Skin is warm and dry without rash. Neurologic: He is awake and alert. He is slow to answer questions but actually answers questions appropriately and is oriented x3, cranial nerves are intact, there are no motor or sensory deficits.  ED Course   Procedures (including critical care time) Labs Review Results for orders placed during the hospital encounter of 01/24/13  CBC      Result Value Range   WBC 12.3 (*) 4.0 - 10.5 K/uL   RBC 3.60 (*) 4.22 - 5.81 MIL/uL   Hemoglobin 10.4 (*) 13.0 - 17.0 g/dL   HCT 31.9 (*) 39.0 - 52.0 %   MCV 88.6  78.0 - 100.0 fL   MCH 28.9  26.0 - 34.0 pg   MCHC 32.6  30.0 - 36.0 g/dL   RDW 14.0  11.5 - 15.5 %   Platelets 272  150 - 400 K/uL  COMPREHENSIVE METABOLIC PANEL      Result Value Range   Sodium 127 (*) 137 - 147 mEq/L   Potassium 5.2  3.7 - 5.3 mEq/L   Chloride 91 (*) 96 - 112 mEq/L   CO2 20  19 - 32 mEq/L   Glucose, Bld 157 (*) 70 - 99 mg/dL   BUN 84 (*) 6 - 23 mg/dL   Creatinine, Ser 4.31 (*) 0.50 - 1.35 mg/dL   Calcium 8.6  8.4 - 10.5 mg/dL   Total Protein 6.5  6.0 - 8.3 g/dL   Albumin 2.9 (*) 3.5 - 5.2 g/dL   AST 66 (*) 0 - 37 U/L   ALT 59 (*) 0 - 53 U/L   Alkaline Phosphatase 145 (*) 39 - 117 U/L   Total Bilirubin 0.9  0.3 - 1.2 mg/dL   GFR calc non Af Amer 13 (*) >90 mL/min   GFR calc Af Amer 15 (*) >90 mL/min  PRO B NATRIURETIC PEPTIDE      Result Value Range   Pro B Natriuretic peptide (BNP) 655.6 (*) 0 - 125 pg/mL  D-DIMER, QUANTITATIVE      Result Value Range   D-Dimer, Quant 0.93 (*) 0.00 - 0.48 ug/mL-FEU   Imaging Review Dg Chest 2 View  01/25/2013   CLINICAL DATA:  Altered mental status.  Shortness of breath.  EXAM: CHEST  2 VIEW  COMPARISON:  None.  FINDINGS: Shallow inspiration. The heart size and mediastinal contours are within normal limits. Both lungs are clear. The visualized skeletal structures are unremarkable.  IMPRESSION: No active cardiopulmonary disease.   Electronically Signed   By: Lucienne Capers M.D.   On: 01/25/2013 00:19   Ct Head Wo Contrast  01/25/2013   CLINICAL DATA:  Lethargic and confused. History of CVA. Hypertension and diabetes. Altered mental status.  EXAM: CT  HEAD WITHOUT CONTRAST  TECHNIQUE: Contiguous axial images were obtained from the  base of the skull through the vertex without intravenous contrast.  COMPARISON:  None.  FINDINGS: Sinuses/Soft tissues: Mucosal thickening of the left maxillary sinus. Near complete opacification of the right maxillary sinus. Clear mastoid air cells.  Intracranial: No mass lesion, hemorrhage, hydrocephalus, acute infarct, intra-axial, or extra-axial fluid collection.  IMPRESSION: 1.  No acute intracranial abnormality. 2. Sinus disease.   Electronically Signed   By: Abigail Miyamoto M.D.   On: 01/25/2013 02:02     Date: 01/25/2013  Rate: 88  Rhythm: normal sinus rhythm  QRS Axis: normal  Intervals: normal  ST/T Wave abnormalities: normal  Conduction Disutrbances:right bundle branch block  Narrative Interpretation: Right bundle branch block. No previous ECG available for comparison.  Old EKG Reviewed: none available   MDM   1. Altered mental status   2. Renal failure   3. Hyponatremia   4. Elevated liver enzymes   5. Acute on chronic kidney failure   6. Altered mental state    Altered mentation. His daughter, and this seems to be improving over what it was at the home. She states that he did hit his head when he fell so he will be sent for CT of his head. Chest x-ray shows no acute process. Because of hypoxia, screening will be obtained with BNP and d-dimer. Old records are reviewed and he was seen 4 days ago for a fall with right humerus fracture.  Laboratory results show renal failure with BUN 84 creatinine of 4.3. I've asked the patient he has ever been treated for renal failure and he denies this. He also has hyponatremia with sodium of 127 and elevated liver enzymes with AST of 66, ALT of 59, and alkaline phosphatase of 145. Mild anemia is present with hemoglobin 10.4. RBC indices are normal and this may be indicative of underlying renal insufficiency but with no known prior history of renal failure, he will need to be admitted.  CT head is negative. BNP is slightly elevated as is a d-dimer  and this is probably related to his renal failure. His daughter does say that he has some degree of renal insufficiency but she does not know how bad it is but he has never been told that he is close to dialysis so it seems likely that there is a significant worsening of his renal failure. Case is discussed with Dr. Doyle Askew of [facility] who agrees to admit the patient.  Delora Fuel, MD 53/29/92 [id number]

## 2013-01-25 NOTE — H&P (Signed)
[facility] History and Physical  Mitchell Garcia LKG:[id number] DOB: 1945-02-17 DOA: 01/24/2013  Referring physician: ED physician PCP: No primary provider on file.   Chief Complaint: altered mental status   HPI:  Patient is a 68 y.o. male presenting with altered mental status.  He was in the ED 4 days ago following a fall with a.m. fracture of the right humerus and was discharged home with prescription for oxycodone-acetaminophen. Today, he was noted to be lethargic and confused. He has only been taking a few oxycodone acetaminophen tablets a day. There's been no fever or chills. There's been no vomiting or diarrhea. He has not been on any other new medications.   Assessment and Plan:  Principal Problem:   Altered mental state - most likely secondary to use of narcotics with questionable dehydration, ? Alcohol use, poor oral intake - will admit to telemetry bed - check 12 lead EKG, CE;s, TSH, UDS, alcohol level - d-dimer elevated on admission, V/Q scan pending  - holding any sedating medicines including his Neurontin and Xanax he takes at home  Active Problems:   Acute on chronic kidney failure - unclear baseline Cr - will place on IVF, check urine sodium and urine creatinine, renal US - consider nephrology consultation in AM   Transaminitis - unclear etiology - ? Alcohol use - will check UDS and alcohol level - place on CIWA in the meantime if needed    Leukocytosis - no clear infectious etiology noted - UA unremarkable and CXR with no specific cardiopulmonary etiology - will monitor closely, CBC in AM   Anemia of chronic disease - likely from DM, unclear baseline Hg - CBC in AM   Hyponatremia - likely pre renal in etiology - will place on IVF - BMP in AM   Diabetes mellitus  - check A1C, continue home medical regimen - add SSI for now    Code Status: Full Family Communication: Pt at bedside Disposition Plan: Admit to telemetry bed   Review of Systems:   Unable to obtain due to AMS.      Past Medical History  Diagnosis Date  . Diabetes mellitus without complication   . Hypertension   . Stroke   . MI, old     years ago  . Cancer     History reviewed. No pertinent past surgical history.  Social History:  reports that he has never smoked. He does not have any smokeless tobacco history on file. He reports that he does not drink alcohol or use illicit drugs.  No Known Allergies  Unable to obtain family history due to AMS   Prior to Admission medications   Medication Sig Start Date End Date Taking? Authorizing Provider  ALPRAZolam Duanne Moron) 0.5 MG tablet Take 0.5 mg by mouth at bedtime as needed for anxiety.    Yes Historical Provider, MD  amLODipine (NORVASC) 10 MG tablet Take 10 mg by mouth daily.   Yes Historical Provider, MD  aspirin EC 81 MG tablet Take 81 mg by mouth daily.   Yes Historical Provider, MD  atorvastatin (LIPITOR) 80 MG tablet Take 80 mg by mouth at bedtime.   Yes Historical Provider, MD  gabapentin (NEURONTIN) 300 MG capsule Take 300 mg by mouth 2 (two) times daily.   Yes Historical Provider, MD  hydrochlorothiazide (HYDRODIURIL) 25 MG tablet Take 25 mg by mouth daily.   Yes Historical Provider, MD  insulin aspart (NOVOLOG) 100 UNIT/ML injection Inject 12-20 Units into the skin 3 (three) times daily  before meals. Patient used 4 units this morning   Yes Historical Provider, MD  insulin glargine (LANTUS) 100 UNIT/ML injection Inject 50 Units into the skin 2 (two) times daily.   Yes Historical Provider, MD  lisinopril (PRINIVIL,ZESTRIL) 40 MG tablet Take 40 mg by mouth daily.   Yes Historical Provider, MD  oxyCODONE-acetaminophen (PERCOCET/ROXICET) 5-325 MG per tablet Take 1-2 tablets by mouth every 6 (six) hours as needed for severe pain. 01/21/13  Yes Threasa Beards, MD  primidone (MYSOLINE) 50 MG tablet Take 100 mg by mouth daily.   Yes Historical Provider, MD    Physical Exam: Filed Vitals:   01/24/13 2106  01/25/13 0216  BP: 107/53 127/51  Pulse: 92 91  Temp: 99.6 F (37.6 C)   TempSrc: Oral   Resp: 18 20  SpO2: 85% 100%    Physical Exam  Constitutional: Appears stable, confused but NAD HENT: Normocephalic. External right and left ear normal. Dry MM Eyes: Conjunctivae and EOM are normal. PERRLA, no scleral icterus.  Neck: Normal ROM. Neck supple. No JVD. No tracheal deviation. No thyromegaly.  CVS: RRR, S1/S2 +, no murmurs, no gallops, no carotid bruit.  Pulmonary: Effort and breath sounds normal, no stridor, rhonchi, wheezes, rales.  Abdominal: Soft. BS +,  no distension, tenderness, rebound or guarding.  Musculoskeletal: Normal range of motion. No edema and no tenderness.  Lymphadenopathy: No lymphadenopathy noted, cervical, inguinal. Neuro: Alert. Confused, oriented to name only, follows some commands  Skin: Skin is warm and dry. No rash noted. Not diaphoretic. No erythema. No pallor.  Psychiatric: Normal mood and affect. Behavior, judgment, thought content normal.   Labs on Admission:  Basic Metabolic Panel:  Recent Labs Lab 01/24/13 2350  NA 127*  K 5.2  CL 91*  CO2 20  GLUCOSE 157*  BUN 84*  CREATININE 4.31*  CALCIUM 8.6   Liver Function Tests:  Recent Labs Lab 01/24/13 2350  AST 66*  ALT 59*  ALKPHOS 145*  BILITOT 0.9  PROT 6.5  ALBUMIN 2.9*   CBC:  Recent Labs Lab 01/24/13 2350  WBC 12.3*  HGB 10.4*  HCT 31.9*  MCV 88.6  PLT 272   CBG:  Recent Labs Lab 01/21/13 0806  GLUCAP 172*    Radiological Exams on Admission: Dg Chest 2 View  01/25/2013    1.  No active cardiopulmonary disease.     Ct Head Wo Contrast  01/25/2013  1.  No acute intracranial abnormality.  2. Sinus disease.      EKG: Normal sinus rhythm, no ST/T wave changes  Faye Ramsay, MD  [facility] Pager [phone]  If 7PM-7AM, please contact night-coverage [email or web address] Password [email or web address] 01/25/2013, 3:15 AM

## 2013-01-25 NOTE — Progress Notes (Signed)
VASCULAR LAB PRELIMINARY  PRELIMINARY  PRELIMINARY  PRELIMINARY  Bilateral lower extremity venous Dopplers completed.    Preliminary report:  There is no DVT or SVT noted in the bilateral lower extremities.   Bryttany Tortorelli, RVT 01/25/2013, 1:30 PM

## 2013-01-25 NOTE — Consult Note (Signed)
Renal Service Consult Note [name]  Mitchell Garcia 01/25/2013 Roney Jaffe D Requesting Physician: Dr. Janece Canterbury  Reason for Consult:  Renal failure HPI: The patient is a 68 y.o. year-old with hx of DM, HTN, CVA, MI and CKD who recently had a fall with R humerus fracture seen in ED 4-5 days ago and d/c'd home on pain medications.  Pt presented last night with AMS with lethargy and confusion.  O2 sat was in the high 80's and oxygen was started.  Today he is more clear, still a little confused.    Family provides most of hx.  He has been seeing a kidney doctor at [facility] for a couple years.  They had told him he is close to needing dialysis. No access has been placed.    His family's main concerns are the fluid build up which was attributed to "CHF" by the [facility] doctors, and they would like to have CKD management with [facility] if possible, with the understanding that when he has to go on dialysis it will be through the [place] most likely.      ROS  no cough, fever, no sob  +leg swellling  no nsaids  no HA   no n/v/d, good appetite at home  no problems with ADL;'s he drives , etc  does not exercise due to neuropathy a/c to wife  Past Medical History  Past Medical History  Diagnosis Date  . Diabetes mellitus with renal complications     uncontrolled  . Hypertension   . Stroke   . MI, old     years ago  . Cancer   . CHF (congestive heart failure)   . Chronic kidney disease    Past Surgical History History reviewed. No pertinent past surgical history. Family History History reviewed. No pertinent family history. Social History  reports that he has never smoked. He does not have any smokeless tobacco history on file. He reports that he does not drink alcohol or use illicit drugs. Allergies No Known Allergies Home medications Prior to Admission medications   Medication Sig Start Date End Date Taking? Authorizing Provider  ALPRAZolam Duanne Moron) 0.5 MG tablet  Take 0.5 mg by mouth at bedtime as needed for anxiety.    Yes Historical Provider, MD  amLODipine (NORVASC) 10 MG tablet Take 10 mg by mouth daily.   Yes Historical Provider, MD  aspirin EC 81 MG tablet Take 81 mg by mouth daily.   Yes Historical Provider, MD  atorvastatin (LIPITOR) 80 MG tablet Take 80 mg by mouth at bedtime.   Yes Historical Provider, MD  gabapentin (NEURONTIN) 300 MG capsule Take 300 mg by mouth 2 (two) times daily.   Yes Historical Provider, MD  hydrochlorothiazide (HYDRODIURIL) 25 MG tablet Take 25 mg by mouth daily.   Yes Historical Provider, MD  insulin aspart (NOVOLOG) 100 UNIT/ML injection Inject 12-20 Units into the skin 3 (three) times daily before meals. Patient used 4 units this morning   Yes Historical Provider, MD  insulin glargine (LANTUS) 100 UNIT/ML injection Inject 50 Units into the skin 2 (two) times daily.   Yes Historical Provider, MD  lisinopril (PRINIVIL,ZESTRIL) 40 MG tablet Take 40 mg by mouth daily.   Yes Historical Provider, MD  oxyCODONE-acetaminophen (PERCOCET/ROXICET) 5-325 MG per tablet Take 1-2 tablets by mouth every 6 (six) hours as needed for severe pain. 01/21/13  Yes Threasa Beards, MD  primidone (MYSOLINE) 50 MG tablet Take 100 mg by mouth daily.   Yes  Historical Provider, MD   Liver Function Tests  Recent Labs Lab 01/24/13 2350 01/25/13 0615  AST 66* 84*  84*  ALT 59* 81*  81*  ALKPHOS 145* 170*  176*  BILITOT 0.9 1.2  1.2  PROT 6.5 6.2  6.3  ALBUMIN 2.9* 2.8*  2.8*   No results found for this basename: LIPASE, AMYLASE,  in the last 168 hours CBC  Recent Labs Lab 01/24/13 2350 01/25/13 0615  WBC 12.3* 12.7*  HGB 10.4* 9.4*  HCT 31.9* 28.5*  MCV 88.6 88.8  PLT 272 401   Basic Metabolic Panel  Recent Labs Lab 01/24/13 2350 01/25/13 0615  NA 127* 134*  131*  K 5.2 5.2  5.1  CL 91* 97  96  CO2 20 21  20   GLUCOSE 157* 114*  115*  BUN 84* 83*  85*  CREATININE 4.31* 3.90*  3.96*  CALCIUM 8.6 8.6  8.4   PHOS  --  6.7*    Exam  Blood pressure 152/64, pulse 89, temperature 98.9 F (37.2 C), temperature source Oral, resp. rate 16, height 6' (1.829 m), weight 124.8 kg (275 lb 2.2 oz), SpO2 98.00%. Obese older adult WM in no distress PERRL, EOMI, sclera anicteric No skin rash or cyanosis Throat clear JVP 12cm Crackles R > L bases bilat RRR no rub or gallop, no M Abd obese, distended, possible ascites, liver down 3 cm Condom cath in place 2+ pitting diffuse LE edema bilat, some edema of lower abd wall Neuro nonfocal no asterixis  UA 100 protein, 0-2 wbc and rbc Renal US 12 cm kidneys, no hydro, nl echo  Assessment/Plan: 1. CKD likely stage IVb- est GFR only 14-16 ml/min.  Needs diuresis primarily, does not need HD at this time.  Due to DM and HTN.  UA benign, [place] no hydro.  Needs a stronger diuretic than HCTZ on d/c.  Will start IV lasix , diurese in hospital for now.  Will arrange for him to f/u with [facility] also after d/c.  Save L arm. Vein mapping.  2. HTN/volume excess: will resume ACEi (lisinopril) at home dose, goal BP 130/80 or less, diuresis.  3. Anemia CKD: Hb 9.4, check fe tibc, ferritin 4. MBD/CKD: phos up 6.7, adjCa ok at 9.4, check PTH, start renvela as binder 5. DM with neuropathy 6. Hx CVA   Kelly Splinter MD (pgr) [phone]    [phone] 01/25/2013, 2:57 PM

## 2013-01-25 NOTE — Progress Notes (Signed)
MD on call texted regarding temp of 101.4. Awaiting response.

## 2013-01-25 NOTE — Progress Notes (Signed)
Pt is unable to answer all the questions for admission because he does not remember and he is hard of hearing and does not wear his hearing aid. Will have to talk with family to finish his admission.

## 2013-01-26 ENCOUNTER — Inpatient Hospital Stay (HOSPITAL_COMMUNITY): Payer: Medicare Other

## 2013-01-26 DIAGNOSIS — I517 Cardiomegaly: Secondary | ICD-10-CM

## 2013-01-26 DIAGNOSIS — E871 Hypo-osmolality and hyponatremia: Secondary | ICD-10-CM

## 2013-01-26 DIAGNOSIS — Z01810 Encounter for preprocedural cardiovascular examination: Secondary | ICD-10-CM

## 2013-01-26 LAB — URINE CULTURE: Colony Count: 3000

## 2013-01-26 LAB — IRON AND TIBC
IRON: 18 ug/dL — AB (ref 42–135)
Saturation Ratios: 11 % — ABNORMAL LOW (ref 20–55)
TIBC: 166 ug/dL — ABNORMAL LOW (ref 215–435)
UIBC: 148 ug/dL (ref 125–400)

## 2013-01-26 LAB — BASIC METABOLIC PANEL
BUN: 90 mg/dL — ABNORMAL HIGH (ref 6–23)
CALCIUM: 8.7 mg/dL (ref 8.4–10.5)
CO2: 21 mEq/L (ref 19–32)
Chloride: 97 mEq/L (ref 96–112)
Creatinine, Ser: 3.29 mg/dL — ABNORMAL HIGH (ref 0.50–1.35)
GFR calc Af Amer: 21 mL/min — ABNORMAL LOW (ref >90)
GFR, EST NON AFRICAN AMERICAN: 18 mL/min — AB (ref >90)
Glucose, Bld: 187 mg/dL — ABNORMAL HIGH (ref 70–99)
Potassium: 4.7 mEq/L (ref 3.7–5.3)
Sodium: 133 mEq/L — ABNORMAL LOW (ref 137–147)

## 2013-01-26 LAB — CBC
HCT: 27.2 % — ABNORMAL LOW (ref 39.0–52.0)
Hemoglobin: 9.2 g/dL — ABNORMAL LOW (ref 13.0–17.0)
MCH: 29.5 pg (ref 26.0–34.0)
MCHC: 33.8 g/dL (ref 30.0–36.0)
MCV: 87.2 fL (ref 78.0–100.0)
PLATELETS: 326 10*3/uL (ref 150–400)
RBC: 3.12 MIL/uL — ABNORMAL LOW (ref 4.22–5.81)
RDW: 13.8 % (ref 11.5–15.5)
WBC: 11 10*3/uL — AB (ref 4.0–10.5)

## 2013-01-26 LAB — FERRITIN: Ferritin: 168 ng/mL (ref 22–322)

## 2013-01-26 LAB — GLUCOSE, CAPILLARY
GLUCOSE-CAPILLARY: 193 mg/dL — AB (ref 70–99)
Glucose-Capillary: 170 mg/dL — ABNORMAL HIGH (ref 70–99)
Glucose-Capillary: 184 mg/dL — ABNORMAL HIGH (ref 70–99)
Glucose-Capillary: 140 mg/dL — ABNORMAL HIGH (ref 70–99)

## 2013-01-26 LAB — VITAMIN D 25 HYDROXY (VIT D DEFICIENCY, FRACTURES): VIT D 25 HYDROXY: 22 ng/mL — AB (ref 30–89)

## 2013-01-26 LAB — INFLUENZA PANEL BY PCR (TYPE A & B)
H1N1 flu by pcr: NOT DETECTED
Influenza A By PCR: NEGATIVE
Influenza B By PCR: NEGATIVE

## 2013-01-26 LAB — TRANSFERRIN: Transferrin: 140 mg/dL — ABNORMAL LOW (ref 200–360)

## 2013-01-26 LAB — VITAMIN B12: VITAMIN B 12: 347 pg/mL (ref 211–911)

## 2013-01-26 LAB — FOLATE RBC: RBC Folate: 734 ng/mL — ABNORMAL HIGH (ref >280)

## 2013-01-26 MED ORDER — MUPIROCIN CALCIUM 2 % EX CREA
TOPICAL_CREAM | Freq: Two times a day (BID) | CUTANEOUS | Status: DC
  Administered 2013-01-26 – 2013-01-27 (×2): 1 via TOPICAL
  Administered 2013-01-27 – 2013-02-01 (×10): via TOPICAL
  Filled 2013-01-26: qty 15

## 2013-01-26 MED ORDER — INSULIN GLARGINE 100 UNIT/ML ~~LOC~~ SOLN
42.0000 [IU] | Freq: Two times a day (BID) | SUBCUTANEOUS | Status: DC
  Administered 2013-01-26 – 2013-01-29 (×7): 42 [IU] via SUBCUTANEOUS
  Filled 2013-01-26 (×9): qty 0.42

## 2013-01-26 MED ORDER — HYDROMORPHONE HCL PF 1 MG/ML IJ SOLN
1.0000 mg | Freq: Once | INTRAMUSCULAR | Status: AC
  Administered 2013-01-26: 13:00:00 1 mg via INTRAVENOUS
  Filled 2013-01-26: qty 1

## 2013-01-26 MED ORDER — DOCUSATE SODIUM 100 MG PO CAPS
100.0000 mg | ORAL_CAPSULE | Freq: Two times a day (BID) | ORAL | Status: DC
  Administered 2013-01-26 – 2013-02-01 (×12): 100 mg via ORAL
  Filled 2013-01-26 (×13): qty 1

## 2013-01-26 MED ORDER — TECHNETIUM TO 99M ALBUMIN AGGREGATED
4.0000 | Freq: Once | INTRAVENOUS | Status: AC | PRN
  Administered 2013-01-26: 14:00:00 4 via INTRAVENOUS

## 2013-01-26 MED ORDER — VITAMINS A & D EX OINT
TOPICAL_OINTMENT | CUTANEOUS | Status: AC
  Administered 2013-01-26: 5
  Filled 2013-01-26: qty 5

## 2013-01-26 MED ORDER — TECHNETIUM TC 99M DIETHYLENETRIAME-PENTAACETIC ACID: 40.0000 | Freq: Once | INTRAVENOUS | Status: AC | PRN

## 2013-01-26 MED ORDER — INSULIN ASPART 100 UNIT/ML ~~LOC~~ SOLN
5.0000 [IU] | Freq: Three times a day (TID) | SUBCUTANEOUS | Status: DC
  Administered 2013-01-26 – 2013-01-28 (×9): 5 [IU] via SUBCUTANEOUS

## 2013-01-26 MED ORDER — OXYCODONE HCL 5 MG PO TABS
5.0000 mg | ORAL_TABLET | Freq: Four times a day (QID) | ORAL | Status: DC | PRN
  Administered 2013-01-26: 5 mg via ORAL
  Filled 2013-01-26: qty 1

## 2013-01-26 MED ORDER — PERFLUTREN LIPID MICROSPHERE
1.0000 mL | INTRAVENOUS | Status: AC | PRN
  Administered 2013-01-26: 13:00:00 2 mL via INTRAVENOUS
  Filled 2013-01-26: qty 10

## 2013-01-26 MED ORDER — OXYCODONE HCL 5 MG PO TABS
5.0000 mg | ORAL_TABLET | Freq: Four times a day (QID) | ORAL | Status: AC
  Administered 2013-01-26 – 2013-01-28 (×8): 5 mg via ORAL
  Filled 2013-01-26 (×8): qty 1

## 2013-01-26 MED ORDER — BISACODYL 10 MG RE SUPP: 10.0000 mg | Freq: Every day | RECTAL | Status: DC | PRN

## 2013-01-26 MED ORDER — SENNA 8.6 MG PO TABS
2.0000 | ORAL_TABLET | Freq: Every day | ORAL | Status: DC
  Administered 2013-01-26 – 2013-02-01 (×6): 17.2 mg via ORAL
  Filled 2013-01-26 (×6): qty 2

## 2013-01-26 MED ORDER — POLYETHYLENE GLYCOL 3350 17 G PO PACK
17.0000 g | PACK | Freq: Every day | ORAL | Status: DC
  Administered 2013-01-26 – 2013-02-01 (×6): 17 g via ORAL
  Filled 2013-01-26 (×7): qty 1

## 2013-01-26 NOTE — Progress Notes (Signed)
Echocardiogram 2D Echocardiogram with Definity has been performed.  Kaius Daino 01/26/2013, 2:23 PM

## 2013-01-26 NOTE — Progress Notes (Signed)
Orders received for blood cultures, lab here to draw. Will cont to monitor.

## 2013-01-26 NOTE — Progress Notes (Addendum)
Upper extremity vein mapping of the left arm has been completed. Could not map right arm due to patient's extreme pain to move arm and arm sling due to injuries.  Right cephalic vein Axilla -2.44WN Mid upper arm -5.74mm Above AC -5.74mm AC- 7.10mm Below AC -4.34mm Mid forearm -4.79mm Wrist-3.9mm      Landry Mellow, RDMS, RVT 01/26/2013

## 2013-01-26 NOTE — Progress Notes (Signed)
Clinical Social Work Department CLINICAL SOCIAL WORK PLACEMENT NOTE 01/26/2013  Patient:  Mitchell Garcia, Mitchell Garcia  Account Number:  [id number] Admit date:  01/24/2013  Clinical Social Worker:  Renold Genta  Date/time:  01/26/2013 03:46 PM  Clinical Social Work is seeking post-discharge placement for this patient at the following level of care:   SKILLED NURSING   (*CSW will update this form in Epic as items are completed)   01/26/2013  Patient/family provided with [place] Department of Clinical Social Work's list of facilities offering this level of care within the geographic area requested by the patient (or if unable, by the patient's family).  01/26/2013  Patient/family informed of their freedom to choose among providers that offer the needed level of care, that participate in Medicare, Medicaid or managed care program needed by the patient, have an available bed and are willing to accept the patient.  01/26/2013  Patient/family informed of [facility]' ownership interest in [facility], as well as of the fact that they are under no obligation to receive care at this facility.  PASARR submitted to EDS on 01/26/2013 PASARR number received from EDS on 01/26/2013  FL2 transmitted to all facilities in geographic area requested by pt/family on  01/26/2013 FL2 transmitted to all facilities within larger geographic area on   Patient informed that his/her managed care company has contracts with or will negotiate with  certain facilities, including the following:     Patient/family informed of bed offers received:   Patient chooses bed at  Physician recommends and patient chooses bed at    Patient to be transferred to  on   Patient to be transferred to facility by   The following physician request were entered in Epic:   Additional Comments:   Winfred Leeds, [place] Worker cell #: [phone]

## 2013-01-26 NOTE — Progress Notes (Signed)
[facility] PROGRESS NOTE  DRAGON THRUSH OFB:[id number] DOB: 02-18-1945 DOA: 01/24/2013 PCP: No primary provider on file.  Assessment/Plan  Altered mental status, likely secondary to lingering effects of narcotics and gabapentin, resolved. -  UDS positive for barbiturates due to primidone -  EtOH neg -  TSH 0.305, fT4 1.06 and fT3 2.  May be sick-euthyroid or mild central hypothyroidism (latter seems less likely)  Acute hypoxic respiratory failure, may be due to hypervolemia from CKD and acute diastolic heart failure.  Resolved with diuresis. -  D-dimer elevated, but VQ neg -  Pro-BNP mildly elevated (may be falsely low in this obese male) -  IS -  OOB  Acute on chronic kidney disease with unknown baseline, but per family, has been told he may eventually need HD.  Likely secondary to diabetes.  Creatinine trending down with diuresis -  Appreciate Nephrology assistance -  FENa:  0.75% -  RUS:  Normal -  Continue diuresis today and reevaluate in AM -  Vein mapping complete of left arm  CHF with lower extremity edema and elevated BNP -  ECHO with grade 2 diastolic heart failure, preserved EF -  Continue CCB -  Holding ACEI due AKI  Increased bilateral lower extremity edema, ddx includes CHF, AoCKD, DVT particularly after being sedentary last few days -  BLE venous Duplex neg for DVT  HTN, previously on norvasc, HCTZ, lisinopril.  BP wnl -  ACE restarted by nephrology -  HCTZ not effective given CrCl -  Continue lasix -  Continue norvasc  Diabetes mellitus, uncontrolled with renal manifestations -  A1c 8.9 -  Increase to 42 units lantus bid -  Add standing aspart with meals to SSI  Hx of CVA -  Increase to FD ASA  -  Risk factor modification   Leukocytosis without fever.  UA neg, CXR without acute disease.  May be mildly elevated from recent humerus fracture. -  Trend WBC  Normocytic anemia, likely secondary to renal parenchymal disease -  iron studies  consistent with chronic disease -  B12 < 400 so will start low dose B12 -  Folate wnl  -  SPEP/UPEP/IFE pending  Hyponatremia may be due to fluid retention, stable.    Transaminitis, may be due to vascular congestion/hypervolemia or to NASH.  No history of EtOH abuse per family.  Generalized weakness -  PT/OT consults  Right humerus fracture and family concerned that patient's pain is not controlled with ultram.  Family aware that he will likely have increased confusion on narcotic medications. -  D/c ultram -  Oxycodone 5mg  q6h, patient may refuse -  XR right shoulder repeated -  Orthopedics consultation  Diet:  renal Access:  PIV IVF:  off Proph:  heparin  Code Status: full Family Communication: patient and his wife and daughter Disposition Plan:  To SNF for rehab when medically stable   Consultants:  Nephrology, Dr. Jonnie Finner  Procedures:  RUS  VQ   CXR   CT head  Antibiotics:  none   HPI/Subjective:  Right arm painful and swollen.  Denies SOB, nausea, vomiting, diarrhea, constipation    Objective: Filed Vitals:   01/25/13 1522 01/25/13 2150 01/26/13 0100 01/26/13 0626  BP: 145/62 140/65  123/50  Pulse: 89 88  68  Temp: 99.5 F (37.5 C) 101.4 F (38.6 C) 98.7 F (37.1 C) 98.7 F (37.1 C)  TempSrc: Oral Oral Oral Oral  Resp: 16 18  18   Height:  Weight:    123.4 kg (272 lb 0.8 oz)  SpO2: 92% 93%  97%    Intake/Output Summary (Last 24 hours) at 01/26/13 2013 Last data filed at 01/26/13 1700  Gross per 24 hour  Intake    720 ml  Output   5050 ml  Net  -4330 ml   Filed Weights   01/25/13 0450 01/26/13 0626  Weight: 124.8 kg (275 lb 2.2 oz) 123.4 kg (272 lb 0.8 oz)    Exam:   General:  Obese CM, No acute distress  HEENT:  NCAT, MMM  Cardiovascular:  Distant heart sounds regular rhythm, no obvious murmurs, 2+ pulses, warm extremities  Respiratory:  CTAB, no increased WOB  Abdomen:   NABS, soft, NT/ND  MSK:   Normal tone and  bulk, right arm ecchymotic from neck to hand, in sling, grip 5-/5, 2+ pulse, 1+ soft pitting bilateral LEE  Neuro:  Grossly intact  Data Reviewed: Basic Metabolic Panel:  Recent Labs Lab 01/24/13 2350 01/25/13 0615 01/26/13 0506  NA 127* 134*  131* 133*  K 5.2 5.2  5.1 4.7  CL 91* 97  96 97  CO2 20 21  20 21   GLUCOSE 157* 114*  115* 187*  BUN 84* 83*  85* 90*  CREATININE 4.31* 3.90*  3.96* 3.29*  CALCIUM 8.6 8.6  8.4 8.7  MG  --  2.4  --   PHOS  --  6.7*  --    Liver Function Tests:  Recent Labs Lab 01/24/13 2350 01/25/13 0615  AST 66* 84*  84*  ALT 59* 81*  81*  ALKPHOS 145* 170*  176*  BILITOT 0.9 1.2  1.2  PROT 6.5 6.2  6.3  ALBUMIN 2.9* 2.8*  2.8*   No results found for this basename: LIPASE, AMYLASE,  in the last 168 hours No results found for this basename: AMMONIA,  in the last 168 hours CBC:  Recent Labs Lab 01/24/13 2350 01/25/13 0615 01/26/13 0506  WBC 12.3* 12.7* 11.0*  HGB 10.4* 9.4* 9.2*  HCT 31.9* 28.5* 27.2*  MCV 88.6 88.8 87.2  PLT 272 277 326   Cardiac Enzymes:  Recent Labs Lab 01/25/13 0615 01/25/13 1247 01/25/13 1815  TROPONINI <0.30 <0.30 <0.30   BNP (last 3 results)  Recent Labs  01/24/13 2350 01/25/13 0615  PROBNP 655.6* 785.0*   CBG:  Recent Labs Lab 01/25/13 1709 01/25/13 2151 01/26/13 0728 01/26/13 1139 01/26/13 1716  GLUCAP 214* 265* 170* 193* 184*    Recent Results (from the past 240 hour(s))  URINE CULTURE     Status: None   Collection Time    01/25/13  5:38 AM      Result Value Range Status   Specimen Description URINE, RANDOM   Final   Special Requests NONE   Final   Culture  Setup Time     Final   Value: 01/25/2013 12:04     Performed at [place]     Final   Value: 3,000 COLONIES/ML     Performed at [facility]   Culture     Final   Value: INSIGNIFICANT GROWTH     Performed at [facility]   Report Status 01/26/2013 FINAL   Final      Studies: Dg Chest 2 View  01/25/2013   CLINICAL DATA:  Altered mental status.  Shortness of breath.  EXAM: CHEST  2 VIEW  COMPARISON:  None.  FINDINGS: Shallow inspiration. The heart size  and mediastinal contours are within normal limits. Both lungs are clear. The visualized skeletal structures are unremarkable.  IMPRESSION: No active cardiopulmonary disease.   Electronically Signed   By: Lucienne Capers M.D.   On: 01/25/2013 00:19   Ct Head Wo Contrast  01/25/2013   CLINICAL DATA:  Lethargic and confused. History of CVA. Hypertension and diabetes. Altered mental status.  EXAM: CT HEAD WITHOUT CONTRAST  TECHNIQUE: Contiguous axial images were obtained from the base of the skull through the vertex without intravenous contrast.  COMPARISON:  None.  FINDINGS: Sinuses/Soft tissues: Mucosal thickening of the left maxillary sinus. Near complete opacification of the right maxillary sinus. Clear mastoid air cells.  Intracranial: No mass lesion, hemorrhage, hydrocephalus, acute infarct, intra-axial, or extra-axial fluid collection.  IMPRESSION: 1.  No acute intracranial abnormality. 2. Sinus disease.   Electronically Signed   By: Abigail Miyamoto M.D.   On: 01/25/2013 02:02   US Renal  01/25/2013   CLINICAL DATA:  Acute on chronic kidney failure  EXAM: RENAL/URINARY TRACT ULTRASOUND COMPLETE  COMPARISON:  None.  FINDINGS: Right Kidney:  Length: 12 cm. Echogenicity within normal limits. No mass or hydronephrosis visualized.  Left Kidney:  Length: 12.1 cm. Echogenicity within normal limits. No mass or hydronephrosis visualized.  Bladder:  Appears normal for degree of bladder distention.  IMPRESSION: Normal renal ultrasound.   Electronically Signed   By: Kathreen Devoid   On: 01/25/2013 10:19   Nm Pulmonary Perf And Vent  01/26/2013   CLINICAL DATA:  Elevated D-dimer, evaluate for pulmonary embolism  EXAM: NUCLEAR MEDICINE VENTILATION - PERFUSION LUNG SCAN  TECHNIQUE: Ventilation images were obtained in multiple  projections using inhaled aerosol technetium 99 M DTPA. Perfusion images were obtained in multiple projections after intravenous injection of Tc-35m MAA.  COMPARISON:  Chest radiograph -01/25/2013  RADIOPHARMACEUTICALS:  40 mCi Tc-39m DTPA aerosol and 4 mCi Tc-3m MAA  FINDINGS: Review of chest radiograph performed 01/25/2013 demonstrates a large patient body habitus. Enlarged cardiac silhouette and mediastinal contours. There is mild diffuse slightly nodular thickening of the pulmonary interstitium. No focal airspace opacities. No pleural effusion or pneumothorax. No definite evidence of edema.  Ventilation: There is clumping of inhaled radiotracer about the bilateral pulmonary hila, right greater than left. There is otherwise adequate ventilation of the pulmonary parenchyma bilaterally. Ingested radiotracer is seen within the hypopharynx, esophagus and stomach.  Perfusion: There is homogeneous distribution of injected radiotracer throughout the bilateral pulmonary parenchyma. No discrete mismatched segmental or subsegmental filling defects to suggest pulmonary embolism.  IMPRESSION: Pulmonary embolus absent (very low probability for pulmonary embolism).   Electronically Signed   By: Sandi Mariscal M.D.   On: 01/26/2013 15:26   Dg Shoulder Right Port  01/26/2013   CLINICAL DATA:  Increasing pain  EXAM: PORTABLE RIGHT SHOULDER - 2+ VIEW  COMPARISON:  01/21/2013  FINDINGS: Single view of the right shoulder reveals comminuted right humeral head/ neck fracture with slight separation of fracture fragments. Radiopaque material surrounding the fracture site may indicate early callus formation. Humeral head remains aligned with the glenoid on this single projection.  Visualized lungs clear.  Moderate acromioclavicular joint degenerative changes.  IMPRESSION: Single view of the right shoulder reveals comminuted right humeral head/ neck fracture with slight separation of fracture fragments. Radiopaque material surrounding the  fracture site may indicate early callus formation. Humeral head remains aligned with the glenoid on this single projection  Moderate acromioclavicular joint degenerative changes.   Electronically Signed   By: Mignon Pine.D.  On: 01/26/2013 16:47    Scheduled Meds: . amLODipine  10 mg Oral Daily  . aspirin EC  325 mg Oral Daily  . atorvastatin  80 mg Oral QHS  . furosemide  100 mg Intravenous Q8H  . heparin  5,000 Units Subcutaneous Q8H  . insulin aspart  0-15 Units Subcutaneous TID WC  . insulin aspart  0-5 Units Subcutaneous QHS  . insulin aspart  5 Units Subcutaneous TID WC  . insulin glargine  42 Units Subcutaneous BID  . lisinopril  10 mg Oral Daily  . mupirocin cream   Topical BID  . primidone  100 mg Oral Daily  . sevelamer carbonate  800 mg Oral TID WC  . sodium chloride  3 mL Intravenous Q12H   Continuous Infusions: . sodium chloride      Principal Problem:   Altered mental state Active Problems:   Acute on chronic kidney failure   Transaminitis   Leukocytosis   Anemia of chronic disease   Hyponatremia   Hyperglycemia    Time spent: 30 min    Marylene Masek, [facility]  [facility] Pager [phone]. If 7PM-7AM, please contact night-coverage at [email or web address], password [facility] 01/26/2013, 8:13 PM  LOS: 2 days

## 2013-01-26 NOTE — Progress Notes (Deleted)
Echocardiogram 2D Echocardiogram has been performed.  Mitchell Garcia 01/26/2013, 2:22 PM

## 2013-01-26 NOTE — Evaluation (Signed)
Physical Therapy Evaluation Patient Details Name: Mitchell Garcia MRN: [id number] DOB: May 13, 1945 Today's Date: 01/26/2013 Time: 1191-4782 PT Time Calculation (min): 32 min  PT Assessment / Plan / Recommendation History of Present Illness  pt was admitted with AMS.  He has a R humerus fx from fall, which is immobilized in a sling.  He has a h/o CVA, DM, HTN, MI  Clinical Impression  On eval, pt required +2 assist for bed mobility. Attempted to stand but pt was unable. Recommend ST rehab at SNF.     PT Assessment  Patient needs continued PT services    Follow Up Recommendations  SNF    Does the patient have the potential to tolerate intense rehabilitation      Barriers to Discharge        Equipment Recommendations   (to be determined)    Recommendations for Other Services OT consult   Frequency Min 3X/week    Precautions / Restrictions Precautions Precautions: Fall Required Braces or Orthoses: Sling Restrictions Weight Bearing Restrictions: Yes RUE Weight Bearing: Non weight bearing   Pertinent Vitals/Pain R UE-unrated. Made RN aware of need for pain meds      Mobility  Bed Mobility Bed Mobility: Supine to Sit;Sit to Supine;Scooting to HOB Supine to Sit: 1: +2 Total assist;HOB elevated Supine to Sit: Patient Percentage: 50% Sit to Supine: HOB elevated;3: Mod assist Scooting to [facility]: 4: Min assist Details for Bed Mobility Assistance: Assist for LEs and trunk.  Transfers Details for Transfer Assistance: unable to stand due to hip pain:  lifted hips off bed about 4 inches when scooting to [facility] Ambulation/Gait Ambulation/Gait Assistance: Not tested (comment)    Exercises     PT Diagnosis: Difficulty walking;Abnormality of gait;Generalized weakness;Acute pain  PT Problem List: Decreased strength;Decreased range of motion;Decreased activity tolerance;Decreased balance;Decreased mobility;Obesity;Decreased knowledge of use of DME PT Treatment Interventions: DME  instruction;Gait training;Functional mobility training;Therapeutic activities;Therapeutic exercise;Balance training;Patient/family education     PT Goals(Current goals can be found in the care plan section) Acute Rehab PT Goals Patient Stated Goal: home PT Goal Formulation: With patient Time For Goal Achievement: 02/09/13 Potential to Achieve Goals: Good  Visit Information  Last PT Received On: 01/26/13 Assistance Needed: +2 PT/OT/SLP Co-Evaluation/Treatment: Yes Reason for Co-Treatment: For patient/therapist safety PT goals addressed during session: Mobility/safety with mobility OT goals addressed during session: Strengthening/ROM;ADL's and self-care History of Present Illness: pt was admitted with AMS.  He has a R humerus fx from fall, which is immobilized in a sling.  He has a h/o CVA, DM, HTN, MI       Prior Functioning  Home Living Family/patient expects to be discharged to:: Private residence Living Arrangements: Spouse/significant other Type of Home: House Home Access: Stairs to enter [employer] of Steps: 2 Entrance Stairs-Rails: None Home Layout: One level Home Equipment: [place] - single point Additional Comments: has chair in building he can use for shower Prior Function Level of Independence: Independent Communication Communication: HOH    Cognition  Cognition Arousal/Alertness: Awake/alert Behavior During Therapy: WFL for tasks assessed/performed Overall Cognitive Status: Within Functional Limits for tasks assessed    Extremity/Trunk Assessment Upper Extremity Assessment Upper Extremity Assessment: Defer to OT evaluation RUE Deficits / Details: immobilzed; stroke on this side 20 years ago Lower Extremity Assessment Lower Extremity Assessment: Generalized weakness Cervical / Trunk Assessment Cervical / Trunk Assessment: Normal   Balance Balance Balance Assessed: Yes Static Sitting Balance Static Sitting - Balance Support: Left upper extremity  supported;Feet supported Static Sitting -  Level of Assistance: 5: Stand by assistance Dynamic Sitting Balance Dynamic Sitting - Balance Support: Left upper extremity supported;Feet supported Dynamic Sitting - Level of Assistance: 4: Min assist  End of Session PT - End of Session Equipment Utilized During Treatment: Gait belt Activity Tolerance: Patient limited by pain Patient left: in bed;with call bell/phone within reach  GP     Weston Anna, MPT Pager: [phone]

## 2013-01-26 NOTE — Progress Notes (Signed)
Clinical Social Work Department BRIEF PSYCHOSOCIAL ASSESSMENT 01/26/2013  Patient:  Mitchell Garcia, Mitchell Garcia     Account Number:  [id number]     Admit date:  01/24/2013  Clinical Social Worker:  Renold Genta  Date/Time:  01/26/2013 03:42 PM  Referred by:  Physician  Date Referred:  01/26/2013 Referred for  SNF Placement   Other Referral:   Interview type:  Family Other interview type:   patient's wife, [name] & grandaughter, Estill Bamberg at bedside    PSYCHOSOCIAL DATA Living Status:  WIFE Admitted from facility:   Level of care:   Primary support name:  Evertte Sones (wife) h#: [phone] Primary support relationship to patient:  SPOUSE Degree of support available:   good    CURRENT CONCERNS Current Concerns  Post-Acute Placement   Other Concerns:    SOCIAL WORK ASSESSMENT / PLAN CSW received call from RN that patient's family had questions re: SNF benefits. Note PT recommended SNF for patient at discharge.   Assessment/plan status:  Information/Referral to [employer] Other assessment/ plan:   Information/referral to community resources:   CSW completed FL2 and faxed information out to [facility] - provided list of facilities to wife/grandaughter. CSW began referral process to [place].    PATIENT'S/FAMILY'S RESPONSE TO PLAN OF CARE: Patient's wife & grandaughter were very knowledgable about patient's condition and have been preparing for SNF should he need it. Patient has been going to the [place] for medical services and grandaughter has touched base with transfer coordinator should patient need SNF at [facility].    Patient/family still unsure about decision for dialysis.       Winfred Leeds, [facility] Clinical Social Worker cell #: [phone]

## 2013-01-26 NOTE — Evaluation (Signed)
Occupational Therapy Evaluation Patient Details Name: Mitchell Garcia MRN: [id number] DOB: 03-11-1945 Today's Date: 01/26/2013 Time: 7829-5621 OT Time Calculation (min): 32 min  OT Assessment / Plan / Recommendation History of present illness pt was admitted with AMS.  He has a R humerus fx from fall, which is immobilized in a sling.  He has a h/o CVA, DM, HTN, MI   Clinical Impression   Pt was admitted for the above.  At baseline, he is independent with adls.  Pt's cognition was WFLs at time of eval, but he was limited by pain.  He will benefit from skilled OT to increase safety and independence with adls.  Goals in acute are for overall mod A level.      OT Assessment  Patient needs continued OT Services    Follow Up Recommendations  SNF    Barriers to Discharge      Equipment Recommendations  3 in 1 bedside comode    Recommendations for Other Services    Frequency  Min 2X/week    Precautions / Restrictions Precautions Precautions: Fall Restrictions Weight Bearing Restrictions: No   Pertinent Vitals/Pain Pain in RUE and hip:  Not rated but stated it hurt a lot.  Repositioned and ice applied to shoulder. Requested pain meds    ADL  Eating/Feeding: Set up Where Assessed - Eating/Feeding: Bed level Grooming:  (occasional min A for teeth/managing supplies) Where Assessed - Grooming: Unsupported sitting Upper Body Bathing: Moderate assistance Where Assessed - Upper Body Bathing: Unsupported sitting Lower Body Bathing: +2 Total assistance Lower Body Bathing: Patient Percentage: 30% Where Assessed - Lower Body Bathing: Lean right and/or left;Unsupported sitting Upper Body Dressing: Maximal assistance Where Assessed - Upper Body Dressing: Unsupported sitting Lower Body Dressing: +2 Total assistance Lower Body Dressing: Patient Percentage: 0% Where Assessed - Lower Body Dressing: Unsupported sitting;Lean right and/or left Transfers/Ambulation Related to ADLs: pt got to eob  with A x 2 , pt 50% for mobility with HOB raised.  He was able to scoot up towards [facility] with min A:  unable to stand due to pain in both hip and arm ADL Comments: pt limited with adls due to pain and immobilized RUE    OT Diagnosis: Generalized weakness  OT Problem List: Decreased strength;Decreased activity tolerance;Pain;Decreased knowledge of use of DME or AE (unable to assess standing balance) OT Treatment Interventions: Self-care/ADL training;DME and/or AE instruction;Patient/family education;Therapeutic activities   OT Goals(Current goals can be found in the care plan section) Acute Rehab OT Goals Patient Stated Goal: home OT Goal Formulation: With patient Time For Goal Achievement: 02/09/13 Potential to Achieve Goals: Good ADL Goals Pt Will Perform Lower Body Bathing: with mod assist;with adaptive equipment;sit to/from stand Pt Will Transfer to Toilet: with mod assist;stand pivot transfer;bedside commode Additional ADL Goal #1: pt will stand with quad cane/hemiwalker for 2 minutes with min guard for LB adls Additional ADL Goal #2: pt will go from supine to sit with min A from hospital bed in preparation for adls  Visit Information  Last OT Received On: 01/26/13 PT/OT/SLP Co-Evaluation/Treatment: Yes Reason for Co-Treatment: For patient/therapist safety OT goals addressed during session: Strengthening/ROM;ADL's and self-care History of Present Illness: pt was admitted with AMS.  He has a R humerus fx from fall, which is immobilized in a sling.  He has a h/o CVA, DM, HTN, MI       Prior Functioning     Home Living Family/patient expects to be discharged to:: Private residence Living Arrangements: Spouse/significant other  Type of Home: House Home Access: Stairs to enter [employer] of Steps: 2 Entrance Stairs-Rails: None Home Layout: One level Home Equipment: Cane - single point Additional Comments: has chair in building he can use for shower Prior  Function Level of Independence: Independent Communication Communication: HOH         Vision/Perception     Cognition       Extremity/Trunk Assessment Upper Extremity Assessment Upper Extremity Assessment: RUE deficits/detail RUE Deficits / Details: immobilzed; stroke on this side 20 years ago     Mobility Bed Mobility Bed Mobility: Supine to Sit;Sit to Supine Supine to Sit: 1: +2 Total assist;HOB elevated Supine to Sit: Patient Percentage: 50% Details for Bed Mobility Assistance: assist for RLE and trunk Transfers Details for Transfer Assistance: unable to stand due to hip pain:  lifted hips off bed about 4 inches when scooting to [facility]     Exercise     Balance     End of Session OT - End of Session Activity Tolerance: Patient limited by pain Patient left: in bed;with call bell/phone within reach;with bed alarm set;with family/visitor present  GO     Taleigha Pinson 01/26/2013, 12:04 PM Lesle Chris, OTR/L [phone] 01/26/2013

## 2013-01-27 DIAGNOSIS — S42293A Other displaced fracture of upper end of unspecified humerus, initial encounter for closed fracture: Secondary | ICD-10-CM

## 2013-01-27 DIAGNOSIS — D638 Anemia in other chronic diseases classified elsewhere: Secondary | ICD-10-CM

## 2013-01-27 DIAGNOSIS — I5033 Acute on chronic diastolic (congestive) heart failure: Secondary | ICD-10-CM

## 2013-01-27 HISTORY — DX: Other displaced fracture of upper end of unspecified humerus, initial encounter for closed fracture: S42.293A

## 2013-01-27 LAB — CBC
HCT: 28 % — ABNORMAL LOW (ref 39.0–52.0)
Hemoglobin: 9.4 g/dL — ABNORMAL LOW (ref 13.0–17.0)
MCH: 29.4 pg (ref 26.0–34.0)
MCHC: 33.6 g/dL (ref 30.0–36.0)
MCV: 87.5 fL (ref 78.0–100.0)
Platelets: 348 10*3/uL (ref 150–400)
RBC: 3.2 MIL/uL — AB (ref 4.22–5.81)
RDW: 13.8 % (ref 11.5–15.5)
WBC: 12.7 10*3/uL — AB (ref 4.0–10.5)

## 2013-01-27 LAB — BASIC METABOLIC PANEL
BUN: 100 mg/dL — ABNORMAL HIGH (ref 6–23)
CHLORIDE: 93 meq/L — AB (ref 96–112)
CO2: 21 meq/L (ref 19–32)
CREATININE: 3.04 mg/dL — AB (ref 0.50–1.35)
Calcium: 8.9 mg/dL (ref 8.4–10.5)
GFR calc Af Amer: 23 mL/min — ABNORMAL LOW (ref >90)
GFR calc non Af Amer: 19 mL/min — ABNORMAL LOW (ref >90)
GLUCOSE: 158 mg/dL — AB (ref 70–99)
Potassium: 4.8 mEq/L (ref 3.7–5.3)
Sodium: 131 mEq/L — ABNORMAL LOW (ref 137–147)

## 2013-01-27 LAB — GLUCOSE, CAPILLARY
GLUCOSE-CAPILLARY: 227 mg/dL — AB (ref 70–99)
GLUCOSE-CAPILLARY: 173 mg/dL — AB (ref 70–99)
Glucose-Capillary: 157 mg/dL — ABNORMAL HIGH (ref 70–99)

## 2013-01-27 MED ORDER — FUROSEMIDE 80 MG PO TABS
80.0000 mg | ORAL_TABLET | Freq: Two times a day (BID) | ORAL | Status: DC
  Administered 2013-01-27 – 2013-01-28 (×4): 80 mg via ORAL
  Filled 2013-01-27 (×8): qty 1

## 2013-01-27 NOTE — Progress Notes (Addendum)
[facility] Progress Note    Subjective: Down 4-5 kg with diuresis, no new problems   Filed Vitals:   01/26/13 0626 01/26/13 2148 01/27/13 0556 01/27/13 0622  BP: 123/50 111/58 126/60   Pulse: 68 68 76   Temp: 98.7 F (37.1 C) 98.4 F (36.9 C) 98 F (36.7 C)   TempSrc: Oral Oral Oral   Resp: 18 18 18    Height:      Weight: 123.4 kg (272 lb 0.8 oz)   120.1 kg (264 lb 12.4 oz)  SpO2: 97% 95% 92%   Exam Alert, no distress No JVD Clear bilat RRR no rub or M Abd soft, nt/nd LE edema improved, mostly resolved Alert, no asterixis  UA 100 protein, 0-2 wbc and rbc  Renal US 12 cm kidneys, no hydro, nl echo  Assessment/Plan:  1. CKD stage 4b: [facility], however family wants to f/u with [facility] for CKD management; will arrange appt and contact pt/family next week with details. Will sign off. Please call as needed. Will ask dietician to see for renal diet also.  2. HTN/volume excess: edema mostly resolved, will need relatively high dose po lasix at home, will start with 80 mg bid 3. Anemia CKD: Hb 9.4, no Rx at this time 4. MBD/CKD: phos up 6.7, adjCa ok at 9.4, check PTH, started renvela as binder 5. DM with neuropathy 6. Hx CVA 7. Diastolic dysfunction by echo, grade 2   Kelly Splinter MD  pager [phone]    cell [phone]  01/27/2013, 7:21 AM   Recent Labs Lab 01/24/13 2350 01/25/13 0615 01/26/13 0506 01/27/13 0540  NA 127* 134*  131* 133* 131*  K 5.2 5.2  5.1 4.7 4.8  CL 91* 97  96 97 93*  CO2 20 21  20 21 21   GLUCOSE 157* 114*  115* 187* 158*  BUN 84* 83*  85* 90* 100*  CREATININE 4.31* 3.90*  3.96* 3.29* 3.04*  CALCIUM 8.6 8.6  8.4 8.7 8.9  PHOS  --  6.7*  --   --     Recent Labs Lab 01/24/13 2350 01/25/13 0615  AST 66* 84*  84*  ALT 59* 81*  81*  ALKPHOS 145* 170*  176*  BILITOT 0.9 1.2  1.2  PROT 6.5 6.2  6.3  ALBUMIN 2.9* 2.8*  2.8*    Recent Labs Lab 01/25/13 0615 01/26/13 0506 01/27/13 0540  WBC 12.7* 11.0*  12.7*  HGB 9.4* 9.2* 9.4*  HCT 28.5* 27.2* 28.0*  MCV 88.8 87.2 87.5  PLT 277 326 348   . amLODipine  10 mg Oral Daily  . aspirin EC  325 mg Oral Daily  . atorvastatin  80 mg Oral QHS  . docusate sodium  100 mg Oral BID  . furosemide  100 mg Intravenous Q8H  . heparin  5,000 Units Subcutaneous Q8H  . insulin aspart  0-15 Units Subcutaneous TID WC  . insulin aspart  0-5 Units Subcutaneous QHS  . insulin aspart  5 Units Subcutaneous TID WC  . insulin glargine  42 Units Subcutaneous BID  . lisinopril  10 mg Oral Daily  . mupirocin cream   Topical BID  . oxyCODONE  5 mg Oral Q6H  . polyethylene glycol  17 g Oral Daily  . primidone  100 mg Oral Daily  . senna  2 tablet Oral QHS  . sevelamer carbonate  800 mg Oral TID WC  . sodium chloride  3 mL Intravenous Q12H   .  sodium chloride     acetaminophen, bisacodyl, ondansetron (ZOFRAN) IV, ondansetron

## 2013-01-27 NOTE — Progress Notes (Signed)
Orthopedics Progress Note  Subjective: My shoulder hurts  Objective:  Filed Vitals:   01/27/13 1434  BP: 124/82  Pulse: 64  Temp: 98.4 F (36.9 C)  Resp: 18    General: Awake and alert  Musculoskeletal: right shoulder bruised and swollen, unable to move it due to pain Neurovascularly intact  Lab Results  Component Value Date   WBC 12.7* 01/27/2013   HGB 9.4* 01/27/2013   HCT 28.0* 01/27/2013   MCV 87.5 01/27/2013   PLT 348 01/27/2013       Component Value Date/Time   NA 131* 01/27/2013 0540   K 4.8 01/27/2013 0540   CL 93* 01/27/2013 0540   CO2 21 01/27/2013 0540   GLUCOSE 158* 01/27/2013 0540   BUN 100* 01/27/2013 0540   CREATININE 3.04* 01/27/2013 0540   CALCIUM 8.9 01/27/2013 0540   GFRNONAA 19* 01/27/2013 0540   GFRAA 23* 01/27/2013 0540    No results found for this basename: INR, PROTIME    Assessment/Plan:  s/p Right proximal humerus fracture I discussed with Mr Lezcano that this fracture does not need surgery but will heal on its own.  He must keep the arm across his waist in an arm sling.  Also needs to keep a pillow or blankets propped behind the right elbow to keep the arm in proper position.  Follow up in ortho in two weeks. [phone]  Thanks!  [phone]  Doran Heater Veverly Fells, MD 01/27/2013 3:27 PM

## 2013-01-27 NOTE — Consult Note (Signed)
Reason for Consult:Right broken shoulder Referring Physician:   [name] is an 68 y.o. male.  HPI: Dr Sheran Fava  Past Medical History  Diagnosis Date  . Diabetes mellitus with renal complications     uncontrolled  . Hypertension   . Stroke   . MI, old     years ago  . Cancer   . CHF (congestive heart failure)   . Chronic kidney disease     History reviewed. No pertinent past surgical history.  History reviewed. No pertinent family history.  Social History:  reports that he has never smoked. He does not have any smokeless tobacco history on file. He reports that he does not drink alcohol or use illicit drugs.  Allergies: No Known Allergies  Medications: I have reviewed the patient's current medications.  Results for orders placed during the hospital encounter of 01/24/13 (from the past 48 hour(s))  GLUCOSE, CAPILLARY     Status: Abnormal   Collection Time    01/25/13  5:09 PM      Result Value Range   Glucose-Capillary 214 (*) 70 - 99 mg/dL   Comment 1 Notify RN    TROPONIN I     Status: None   Collection Time    01/25/13  6:15 PM      Result Value Range   Troponin I <0.30  <0.30 ng/mL   Comment:            Due to the release kinetics of cTnI,     a negative result within the first hours     of the onset of symptoms does not rule out     myocardial infarction with certainty.     If myocardial infarction is still suspected,     repeat the test at appropriate intervals.  GLUCOSE, CAPILLARY     Status: Abnormal   Collection Time    01/25/13  9:51 PM      Result Value Range   Glucose-Capillary 265 (*) 70 - 99 mg/dL   Comment 1 Documented in Chart     Comment 2 Notify RN    CULTURE, BLOOD (ROUTINE X 2)     Status: None   Collection Time    01/25/13 11:02 PM      Result Value Range   Specimen Description BLOOD LEFT HAND     Special Requests BOTTLES DRAWN AEROBIC AND ANAEROBIC 10CC     Culture  Setup Time       Value: 01/26/2013 09:03     Performed at  [facility]   Culture       Value:        BLOOD CULTURE RECEIVED NO GROWTH TO DATE CULTURE WILL BE HELD FOR 5 DAYS BEFORE ISSUING A FINAL NEGATIVE REPORT     Performed at [facility]   Report Status PENDING    CULTURE, BLOOD (ROUTINE X 2)     Status: None   Collection Time    01/25/13 11:06 PM      Result Value Range   Specimen Description BLOOD LEFT ARM     Special Requests BOTTLES DRAWN AEROBIC ONLY 4CC     Culture  Setup Time       Value: 01/26/2013 09:03     Performed at [facility]   Culture       Value:        BLOOD CULTURE RECEIVED NO GROWTH TO DATE CULTURE WILL BE HELD FOR 5 DAYS BEFORE ISSUING A FINAL  NEGATIVE REPORT     Performed at [facility]   Report Status PENDING    CBC     Status: Abnormal   Collection Time    01/26/13  5:06 AM      Result Value Range   WBC 11.0 (*) 4.0 - 10.5 K/uL   RBC 3.12 (*) 4.22 - 5.81 MIL/uL   Hemoglobin 9.2 (*) 13.0 - 17.0 g/dL   HCT 27.2 (*) 39.0 - 52.0 %   MCV 87.2  78.0 - 100.0 fL   MCH 29.5  26.0 - 34.0 pg   MCHC 33.8  30.0 - 36.0 g/dL   RDW 13.8  11.5 - 15.5 %   Platelets 326  150 - 400 K/uL  BASIC METABOLIC PANEL     Status: Abnormal   Collection Time    01/26/13  5:06 AM      Result Value Range   Sodium 133 (*) 137 - 147 mEq/L   Comment: Please note change in reference range.   Potassium 4.7  3.7 - 5.3 mEq/L   Comment: Please note change in reference range.   Chloride 97  96 - 112 mEq/L   CO2 21  19 - 32 mEq/L   Glucose, Bld 187 (*) 70 - 99 mg/dL   BUN 90 (*) 6 - 23 mg/dL   Creatinine, Ser 3.29 (*) 0.50 - 1.35 mg/dL   Calcium 8.7  8.4 - 10.5 mg/dL   GFR calc non Af Amer 18 (*) >90 mL/min   GFR calc Af Amer 21 (*) >90 mL/min   Comment: (NOTE)     The eGFR has been calculated using the CKD EPI equation.     This calculation has not been validated in all clinical situations.     eGFR's persistently <90 mL/min signify possible Chronic Kidney     Disease.  IRON AND TIBC     Status:  Abnormal   Collection Time    01/26/13  5:06 AM      Result Value Range   Iron 18 (*) 42 - 135 ug/dL   TIBC 166 (*) 215 - 435 ug/dL   Saturation Ratios 11 (*) 20 - 55 %   UIBC 148  125 - 400 ug/dL   Comment: Performed at [place]     Status: None   Collection Time    01/26/13  5:06 AM      Result Value Range   Ferritin 168  22 - 322 ng/mL   Comment: Performed at [place]     Status: Abnormal   Collection Time    01/26/13  5:06 AM      Result Value Range   Transferrin 140 (*) 200 - 360 mg/dL   Comment: Performed at [place] [facility]     Status: Abnormal   Collection Time    01/26/13  5:06 AM      Result Value Range   RBC Folate 734 (*) >280 ng/mL   Comment: Reference range not established for pediatric patients.     Performed at [place]     Status: None   Collection Time    01/26/13  5:06 AM      Result Value Range   Vitamin B-12 347  211 - 911 pg/mL   Comment: Performed at [place] D 25 HYDROXY     Status: Abnormal   Collection Time    01/26/13  5:06 AM      Result Value Range   Vit D, 25-Hydroxy 22 (*) 30 - 89 ng/mL   Comment: (NOTE)     This assay accurately quantifies Vitamin D, which is the sum of the     25-Hydroxy forms of Vitamin D2 and D3.  Studies have shown that the     optimum concentration of 25-Hydroxy Vitamin D is 30 ng/mL or higher.      Concentrations of Vitamin D between 20 and 29 ng/mL are considered to     be insufficient and concentrations less than 20 ng/mL are considered     to be deficient for Vitamin D.     Performed at [place], CAP[facility]     Status: Abnormal   Collection Time    01/26/13  7:28 AM      Result Value Range   Glucose-Capillary 170 (*) 70 - 99 mg/dL  INFLUENZA PANEL BY PCR     Status: None   Collection Time    01/26/13  9:08 AM      Result Value Range   Influenza A By PCR NEGATIVE  NEGATIVE    Influenza B By PCR NEGATIVE  NEGATIVE   H1N1 flu by pcr NOT DETECTED  NOT DETECTED   Comment:            The Xpert Flu assay (FDA approved for     nasal aspirates or washes and     nasopharyngeal swab specimens), is     intended as an aid in the diagnosis of     influenza and should not be used as     a sole basis for treatment.     Performed at [facility], [facility]     Status: Abnormal   Collection Time    01/26/13 11:39 AM      Result Value Range   Glucose-Capillary 193 (*) 70 - 99 mg/dL  GLUCOSE, CAPILLARY     Status: Abnormal   Collection Time    01/26/13  5:16 PM      Result Value Range   Glucose-Capillary 184 (*) 70 - 99 mg/dL  GLUCOSE, CAPILLARY     Status: Abnormal   Collection Time    01/26/13  9:47 PM      Result Value Range   Glucose-Capillary 140 (*) 70 - 99 mg/dL  CBC     Status: Abnormal   Collection Time    01/27/13  5:40 AM      Result Value Range   WBC 12.7 (*) 4.0 - 10.5 K/uL   RBC 3.20 (*) 4.22 - 5.81 MIL/uL   Hemoglobin 9.4 (*) 13.0 - 17.0 g/dL   HCT 28.0 (*) 39.0 - 52.0 %   MCV 87.5  78.0 - 100.0 fL   MCH 29.4  26.0 - 34.0 pg   MCHC 33.6  30.0 - 36.0 g/dL   RDW 13.8  11.5 - 15.5 %   Platelets 348  150 - 400 K/uL  BASIC METABOLIC PANEL     Status: Abnormal   Collection Time    01/27/13  5:40 AM      Result Value Range   Sodium 131 (*) 137 - 147 mEq/L   Comment: Please note change in reference range.   Potassium 4.8  3.7 - 5.3 mEq/L   Comment: Please note change in reference range.   Chloride 93 (*) 96 - 112 mEq/L   CO2 21  19 - 32 mEq/L  Glucose, Bld 158 (*) 70 - 99 mg/dL   BUN 100 (*) 6 - 23 mg/dL   Creatinine, Ser 3.04 (*) 0.50 - 1.35 mg/dL   Calcium 8.9  8.4 - 10.5 mg/dL   GFR calc non Af Amer 19 (*) >90 mL/min   GFR calc Af Amer 23 (*) >90 mL/min   Comment: (NOTE)     The eGFR has been calculated using the CKD EPI equation.     This calculation has not been validated in all clinical situations.     eGFR's  persistently <90 mL/min signify possible Chronic Kidney     Disease.  GLUCOSE, CAPILLARY     Status: Abnormal   Collection Time    01/27/13  7:23 AM      Result Value Range   Glucose-Capillary 157 (*) 70 - 99 mg/dL  GLUCOSE, CAPILLARY     Status: Abnormal   Collection Time    01/27/13 12:01 PM      Result Value Range   Glucose-Capillary 227 (*) 70 - 99 mg/dL    Nm Pulmonary Perf And Vent  01/26/2013   CLINICAL DATA:  Elevated D-dimer, evaluate for pulmonary embolism  EXAM: NUCLEAR MEDICINE VENTILATION - PERFUSION LUNG SCAN  TECHNIQUE: Ventilation images were obtained in multiple projections using inhaled aerosol technetium 99 M DTPA. Perfusion images were obtained in multiple projections after intravenous injection of Tc-62m MAA.  COMPARISON:  Chest radiograph -01/25/2013  RADIOPHARMACEUTICALS:  40 mCi Tc-16m DTPA aerosol and 4 mCi Tc-97m MAA  FINDINGS: Review of chest radiograph performed 01/25/2013 demonstrates a large patient body habitus. Enlarged cardiac silhouette and mediastinal contours. There is mild diffuse slightly nodular thickening of the pulmonary interstitium. No focal airspace opacities. No pleural effusion or pneumothorax. No definite evidence of edema.  Ventilation: There is clumping of inhaled radiotracer about the bilateral pulmonary hila, right greater than left. There is otherwise adequate ventilation of the pulmonary parenchyma bilaterally. Ingested radiotracer is seen within the hypopharynx, esophagus and stomach.  Perfusion: There is homogeneous distribution of injected radiotracer throughout the bilateral pulmonary parenchyma. No discrete mismatched segmental or subsegmental filling defects to suggest pulmonary embolism.  IMPRESSION: Pulmonary embolus absent (very low probability for pulmonary embolism).   Electronically Signed   By: Sandi Mariscal M.D.   On: 01/26/2013 15:26   Dg Shoulder Right Port  01/26/2013   CLINICAL DATA:  Increasing pain  EXAM: PORTABLE RIGHT SHOULDER -  2+ VIEW  COMPARISON:  01/21/2013  FINDINGS: Single view of the right shoulder reveals comminuted right humeral head/ neck fracture with slight separation of fracture fragments. Radiopaque material surrounding the fracture site may indicate early callus formation. Humeral head remains aligned with the glenoid on this single projection.  Visualized lungs clear.  Moderate acromioclavicular joint degenerative changes.  IMPRESSION: Single view of the right shoulder reveals comminuted right humeral head/ neck fracture with slight separation of fracture fragments. Radiopaque material surrounding the fracture site may indicate early callus formation. Humeral head remains aligned with the glenoid on this single projection  Moderate acromioclavicular joint degenerative changes.   Electronically Signed   By: Chauncey Cruel M.D.   On: 01/26/2013 16:47    ROS Blood pressure 124/82, pulse 64, temperature 98.4 F (36.9 C), temperature source Oral, resp. rate 18, height 6' (1.829 m), weight 120.1 kg (264 lb 12.4 oz), SpO2 95.00%. Physical Exam  Patient laying in bed with the right shoulder in a sling.  Bruising and swelling noted in the right shoulder area.  Skin otherwise intact,  pain free hand and wrist ROM. Shoulder ROM diminished due to pain. Left UE no pain with full AROM, Bilateral LEs with no pain with AROM.  Assessment/Plan: Right proximal humerus fracture with acceptable alignment.  Plan closed management with sling and positioning of the arm "hugging self - forearm across the waist". Ortho follow up with [facility] in two weeks, [phone] Analgesia.  No therapy for four weeks for the shoulder.  Aubriel Khanna,STEVEN R 01/27/2013, 3:30 PM

## 2013-01-27 NOTE — Progress Notes (Signed)
[facility] PROGRESS NOTE  KYI ROMANELLO IRS:[id number] DOB: [birth year] DOA: 01/24/2013 PCP: No primary provider on file.  Assessment/Plan  Altered mental status, likely secondary to lingering effects of narcotics and gabapentin, resolved. -  UDS positive for barbiturates due to primidone -  EtOH neg -  TSH 0.305, fT4 1.06 and fT3 2.  May be sick-euthyroid or mild central hypothyroidism (latter seems less likely)  Acute hypoxic respiratory failure, may be due to hypervolemia from CKD and acute diastolic heart failure.  Resolved with diuresis. -  D-dimer elevated, but VQ neg -  Pro-BNP mildly elevated (may be falsely low in this obese male) -  IS -  OOB  Acute on chronic kidney disease with unknown baseline, but per family, has been told he may eventually need HD.  Likely secondary to diabetes.  Creatinine trending down with diuresis, BUN rising -  Appreciate Nephrology assistance -  FENa:  0.75% -  RUS:  Normal -  Transitioned to oral lasix today -  Vein mapping complete of left arm  CHF with lower extremity edema and elevated BNP -  ECHO with grade 2 diastolic heart failure, preserved EF -  Continue CCB and ACEI  Increased bilateral lower extremity edema, ddx includes CHF, AoCKD, DVT particularly after being sedentary last few days -  BLE venous Duplex neg for DVT  HTN, previously on norvasc, HCTZ, lisinopril.  BP wnl -  ACE restarted by nephrology -  HCTZ not effective given CrCl -  Continue lasix -  Continue norvasc  Diabetes mellitus, uncontrolled with renal manifestations -  A1c 8.9 -  Increase to 42 units lantus bid -  Add standing aspart with meals to SSI  Hx of CVA -  Increased to FD ASA  -  Risk factor modification   Leukocytosis without fever.  UA neg, CXR without acute disease.  May be mildly elevated from recent humerus fracture. -  Trend WBC  Normocytic anemia, likely secondary to renal parenchymal disease -  iron studies consistent with chronic  disease -  B12 < 400 so will start low dose B12 -  Folate wnl  -  SPEP/UPEP/IFE pending  Hyponatremia may be due to fluid retention, stable.    Transaminitis, may be due to vascular congestion/hypervolemia or to NASH.  No history of EtOH abuse per family.  Generalized weakness -  PT/OT consults rec SNF  Right humerus fracture and family concerned that patient's pain is not controlled with ultram.  Family aware that he will likely have increased confusion on narcotic medications. -  Continue Oxycodone 5mg  q6h, patient may refuse -  Appreciate Orthopedics assistance -  Plan for follow up in 4-5 weeks.  Family to call for appointment  Diet:  renal Access:  PIV IVF:  off Proph:  heparin  Code Status: full Family Communication: patient and his wife and daughter Disposition Plan:  To SNF for rehab when medically stable   Consultants:  Nephrology, Dr. Jonnie Finner  Orthopedics, Dr. Veverly Fells  Procedures:  RUS  VQ   CXR   CT head  Antibiotics:  none   HPI/Subjective:  Right arm painful and swollen.  Denies SOB, nausea, vomiting, diarrhea, constipation    Objective: Filed Vitals:   01/26/13 2148 01/27/13 0556 01/27/13 0622 01/27/13 1434  BP: 111/58 126/60  124/82  Pulse: 68 76  64  Temp: 98.4 F (36.9 C) 98 F (36.7 C)  98.4 F (36.9 C)  TempSrc: Oral Oral  Oral  Resp: 18 18  18  Height:      Weight:   120.1 kg (264 lb 12.4 oz)   SpO2: 95% 92%  95%    Intake/Output Summary (Last 24 hours) at 01/27/13 1726 Last data filed at 01/27/13 1300  Gross per 24 hour  Intake    480 ml  Output   2400 ml  Net  -1920 ml   Filed Weights   01/25/13 0450 01/26/13 0626 01/27/13 0622  Weight: 124.8 kg (275 lb 2.2 oz) 123.4 kg (272 lb 0.8 oz) 120.1 kg (264 lb 12.4 oz)    Exam:   General:  Obese CM, No acute distress  HEENT:  NCAT, MMM  Cardiovascular:  Distant heart sounds regular rhythm, no obvious murmurs, 2+ pulses, warm extremities  Respiratory:  CTAB, no  increased WOB  Abdomen:   NABS, soft, NT/ND  MSK:   Normal tone and bulk, right arm ecchymotic from neck to elbow, in sling, grip 5-/5, 2+ pulse, trace pitting bilateral LEE  Neuro:  Grossly intact  Data Reviewed: Basic Metabolic Panel:  Recent Labs Lab 01/24/13 2350 01/25/13 0615 01/26/13 0506 01/27/13 0540  NA 127* 134*  131* 133* 131*  K 5.2 5.2  5.1 4.7 4.8  CL 91* 97  96 97 93*  CO2 20 21  20 21 21   GLUCOSE 157* 114*  115* 187* 158*  BUN 84* 83*  85* 90* 100*  CREATININE 4.31* 3.90*  3.96* 3.29* 3.04*  CALCIUM 8.6 8.6  8.4 8.7 8.9  MG  --  2.4  --   --   PHOS  --  6.7*  --   --    Liver Function Tests:  Recent Labs Lab 01/24/13 2350 01/25/13 0615  AST 66* 84*  84*  ALT 59* 81*  81*  ALKPHOS 145* 170*  176*  BILITOT 0.9 1.2  1.2  PROT 6.5 6.2  6.3  ALBUMIN 2.9* 2.8*  2.8*   No results found for this basename: LIPASE, AMYLASE,  in the last 168 hours No results found for this basename: AMMONIA,  in the last 168 hours CBC:  Recent Labs Lab 01/24/13 2350 01/25/13 0615 01/26/13 0506 01/27/13 0540  WBC 12.3* 12.7* 11.0* 12.7*  HGB 10.4* 9.4* 9.2* 9.4*  HCT 31.9* 28.5* 27.2* 28.0*  MCV 88.6 88.8 87.2 87.5  PLT 272 277 326 348   Cardiac Enzymes:  Recent Labs Lab 01/25/13 0615 01/25/13 1247 01/25/13 1815  TROPONINI <0.30 <0.30 <0.30   BNP (last 3 results)  Recent Labs  01/24/13 2350 01/25/13 0615  PROBNP 655.6* 785.0*   CBG:  Recent Labs Lab 01/26/13 1139 01/26/13 1716 01/26/13 2147 01/27/13 0723 01/27/13 1201  GLUCAP 193* 184* 140* 157* 227*    Recent Results (from the past 240 hour(s))  URINE CULTURE     Status: None   Collection Time    01/25/13  5:38 AM      Result Value Range Status   Specimen Description URINE, RANDOM   Final   Special Requests NONE   Final   Culture  Setup Time     Final   Value: 01/25/2013 12:04     Performed at [facility]     Final   Value: 3,000 COLONIES/ML      Performed at [facility]   Culture     Final   Value: INSIGNIFICANT GROWTH     Performed at [facility]   Report Status 01/26/2013 FINAL   Final  CULTURE, BLOOD (ROUTINE  X 2)     Status: None   Collection Time    01/25/13 11:02 PM      Result Value Range Status   Specimen Description BLOOD LEFT HAND   Final   Special Requests BOTTLES DRAWN AEROBIC AND ANAEROBIC 10CC   Final   Culture  Setup Time     Final   Value: 01/26/2013 09:03     Performed at [facility]   Culture     Final   Value:        BLOOD CULTURE RECEIVED NO GROWTH TO DATE CULTURE WILL BE HELD FOR 5 DAYS BEFORE ISSUING A FINAL NEGATIVE REPORT     Performed at [facility]   Report Status PENDING   Incomplete  CULTURE, BLOOD (ROUTINE X 2)     Status: None   Collection Time    01/25/13 11:06 PM      Result Value Range Status   Specimen Description BLOOD LEFT ARM   Final   Special Requests BOTTLES DRAWN AEROBIC ONLY 4CC   Final   Culture  Setup Time     Final   Value: 01/26/2013 09:03     Performed at [facility]   Culture     Final   Value:        BLOOD CULTURE RECEIVED NO GROWTH TO DATE CULTURE WILL BE HELD FOR 5 DAYS BEFORE ISSUING A FINAL NEGATIVE REPORT     Performed at [facility]   Report Status PENDING   Incomplete     Studies: Nm Pulmonary Perf And Vent  01/26/2013   CLINICAL DATA:  Elevated D-dimer, evaluate for pulmonary embolism  EXAM: NUCLEAR MEDICINE VENTILATION - PERFUSION LUNG SCAN  TECHNIQUE: Ventilation images were obtained in multiple projections using inhaled aerosol technetium 99 M DTPA. Perfusion images were obtained in multiple projections after intravenous injection of Tc-44m MAA.  COMPARISON:  Chest radiograph -01/25/2013  RADIOPHARMACEUTICALS:  40 mCi Tc-67m DTPA aerosol and 4 mCi Tc-79m MAA  FINDINGS: Review of chest radiograph performed 01/25/2013 demonstrates a large patient body habitus. Enlarged cardiac silhouette and mediastinal contours.  There is mild diffuse slightly nodular thickening of the pulmonary interstitium. No focal airspace opacities. No pleural effusion or pneumothorax. No definite evidence of edema.  Ventilation: There is clumping of inhaled radiotracer about the bilateral pulmonary hila, right greater than left. There is otherwise adequate ventilation of the pulmonary parenchyma bilaterally. Ingested radiotracer is seen within the hypopharynx, esophagus and stomach.  Perfusion: There is homogeneous distribution of injected radiotracer throughout the bilateral pulmonary parenchyma. No discrete mismatched segmental or subsegmental filling defects to suggest pulmonary embolism.  IMPRESSION: Pulmonary embolus absent (very low probability for pulmonary embolism).   Electronically Signed   By: Sandi Mariscal M.D.   On: 01/26/2013 15:26   Dg Shoulder Right Port  01/26/2013   CLINICAL DATA:  Increasing pain  EXAM: PORTABLE RIGHT SHOULDER - 2+ VIEW  COMPARISON:  01/21/2013  FINDINGS: Single view of the right shoulder reveals comminuted right humeral head/ neck fracture with slight separation of fracture fragments. Radiopaque material surrounding the fracture site may indicate early callus formation. Humeral head remains aligned with the glenoid on this single projection.  Visualized lungs clear.  Moderate acromioclavicular joint degenerative changes.  IMPRESSION: Single view of the right shoulder reveals comminuted right humeral head/ neck fracture with slight separation of fracture fragments. Radiopaque material surrounding the fracture site may indicate early callus formation. Humeral head remains aligned with the glenoid on this  single projection  Moderate acromioclavicular joint degenerative changes.   Electronically Signed   By: Chauncey Cruel M.D.   On: 01/26/2013 16:47    Scheduled Meds: . amLODipine  10 mg Oral Daily  . aspirin EC  325 mg Oral Daily  . atorvastatin  80 mg Oral QHS  . docusate sodium  100 mg Oral BID  . furosemide   80 mg Oral BID  . heparin  5,000 Units Subcutaneous Q8H  . insulin aspart  0-15 Units Subcutaneous TID WC  . insulin aspart  0-5 Units Subcutaneous QHS  . insulin aspart  5 Units Subcutaneous TID WC  . insulin glargine  42 Units Subcutaneous BID  . lisinopril  10 mg Oral Daily  . mupirocin cream   Topical BID  . oxyCODONE  5 mg Oral Q6H  . polyethylene glycol  17 g Oral Daily  . primidone  100 mg Oral Daily  . senna  2 tablet Oral QHS  . sevelamer carbonate  800 mg Oral TID WC  . sodium chloride  3 mL Intravenous Q12H   Continuous Infusions: . sodium chloride      Principal Problem:   Altered mental state Active Problems:   Acute on chronic kidney failure   Transaminitis   Leukocytosis   Anemia of chronic disease   Hyponatremia   Hyperglycemia    Time spent: 30 min    Avry Roedl, [facility]  [facility] Pager [phone]. If 7PM-7AM, please contact night-coverage at [email or web address], password [facility] 01/27/2013, 5:26 PM  LOS: 3 days

## 2013-01-28 DIAGNOSIS — D72829 Elevated white blood cell count, unspecified: Secondary | ICD-10-CM

## 2013-01-28 DIAGNOSIS — I5033 Acute on chronic diastolic (congestive) heart failure: Secondary | ICD-10-CM

## 2013-01-28 LAB — BASIC METABOLIC PANEL
BUN: 113 mg/dL — ABNORMAL HIGH (ref 6–23)
CO2: 21 meq/L (ref 19–32)
Calcium: 9.1 mg/dL (ref 8.4–10.5)
Chloride: 94 mEq/L — ABNORMAL LOW (ref 96–112)
Creatinine, Ser: 2.91 mg/dL — ABNORMAL HIGH (ref 0.50–1.35)
GFR calc Af Amer: 24 mL/min — ABNORMAL LOW (ref >90)
GFR, EST NON AFRICAN AMERICAN: 21 mL/min — AB (ref >90)
GLUCOSE: 234 mg/dL — AB (ref 70–99)
POTASSIUM: 4.8 meq/L (ref 3.7–5.3)
Sodium: 133 mEq/L — ABNORMAL LOW (ref 137–147)

## 2013-01-28 LAB — GLUCOSE, CAPILLARY
GLUCOSE-CAPILLARY: 232 mg/dL — AB (ref 70–99)
Glucose-Capillary: 137 mg/dL — ABNORMAL HIGH (ref 70–99)
Glucose-Capillary: 199 mg/dL — ABNORMAL HIGH (ref 70–99)
Glucose-Capillary: 165 mg/dL — ABNORMAL HIGH (ref 70–99)
Glucose-Capillary: 175 mg/dL — ABNORMAL HIGH (ref 70–99)

## 2013-01-28 MED ORDER — TRAMADOL HCL 50 MG PO TABS
100.0000 mg | ORAL_TABLET | Freq: Two times a day (BID) | ORAL | Status: DC
  Administered 2013-01-28 – 2013-01-31 (×6): 100 mg via ORAL
  Filled 2013-01-28 (×7): qty 2

## 2013-01-28 MED ORDER — DOCUSATE SODIUM 283 MG RE ENEM
1.0000 | ENEMA | Freq: Once | RECTAL | Status: AC
  Administered 2013-01-28: 283 mg via RECTAL
  Filled 2013-01-28: qty 1

## 2013-01-28 NOTE — Progress Notes (Signed)
[facility] PROGRESS NOTE  Mitchell Garcia [id number] DOB: 11-Oct-1945 DOA: 01/24/2013 PCP: No primary provider on file.  Assessment/Plan  Altered mental status, initially due to lingering effects of narcotics and gabapentin.  Improved, but now having increasing confusion, possibly because of narcotics or worsening uremia. -  UDS positive for barbiturates due to primidone -  EtOH neg -  TSH 0.305, fT4 1.06 and fT3 2.  May be sick-euthyroid or mild central hypothyroidism (latter seems less likely) -  Stop oxycodone and try tramadol again -  If BUN still rising and confusion not improved by holding narcotics, will reconsult nephrology for possible dialysis  Acute hypoxic respiratory failure, may be due to hypervolemia from CKD and acute diastolic heart failure.  Resolved with diuresis. -  D-dimer elevated, but VQ neg -  Pro-BNP mildly elevated (may be falsely low in this obese male) -  IS -  OOB  Acute on chronic kidney disease with unknown baseline, but per family, has been told he may eventually need HD.  Likely secondary to diabetes.  Creatinine trending down with diuresis, BUN rising -  Appreciate Nephrology assistance -  FENa:  0.75% -  RUS:  Normal -  Continue oral lasix  -  Vein mapping complete of left arm -  Concerned that he may need dialysis during this admission given rise in BUN.    CHF with lower extremity edema and elevated BNP -  ECHO with grade 2 diastolic heart failure, preserved EF -  Continue CCB and ACEI  Increased bilateral lower extremity edema, ddx includes CHF, AoCKD, DVT particularly after being sedentary last few days -  BLE venous Duplex neg for DVT  HTN, previously on norvasc, HCTZ, lisinopril.  BP wnl -  ACE restarted by nephrology -  HCTZ not effective given CrCl -  Continue lasix -  Continue norvasc  Diabetes mellitus, uncontrolled with renal manifestations -  A1c 8.9 -  Increase to 42 units lantus bid -  Continue standing aspart with  meals to SSI  Hx of CVA -  Increased to FD ASA  -  Risk factor modification   Leukocytosis without fever.  UA neg, CXR without acute disease.  May be mildly elevated from recent humerus fracture. -  Trend WBC  Normocytic anemia, likely secondary to renal parenchymal disease -  iron studies consistent with chronic disease -  B12 < 400 so will start low dose B12 -  Folate wnl  -  SPEP/UPEP/IFE pending  Hyponatremia may be due to fluid retention, stable.    Transaminitis, may be due to vascular congestion/hypervolemia or to NASH.  No history of EtOH abuse per family.  Generalized weakness -  PT/OT consults rec SNF  Right humerus fracture and family concerned that patient's pain is not controlled with ultram.  Family aware that he will likely have increased confusion on narcotic medications. -  Continue Oxycodone 5mg  q6h, patient may refuse -  Appreciate Orthopedics assistance -  Plan for follow up in 4-5 weeks.  Family to call for appointment  Diet:  renal Access:  PIV IVF:  off Proph:  heparin  Code Status: full Family Communication: patient and his wife and daughter Disposition Plan:  To SNF for rehab when medically stable   Consultants:  Nephrology, Dr. Jonnie Finner  Orthopedics, Dr. Veverly Fells  Procedures:  RUS  VQ   CXR   CT head  Antibiotics:  none   HPI/Subjective:  Right arm painful and swollen.  Denies SOB, nausea, vomiting, diarrhea, constipation.  More confused this morning.   Objective: Filed Vitals:   01/27/13 0622 01/27/13 1434 01/27/13 2046 01/28/13 0512  BP:  124/82 134/60 141/76  Pulse:  64 72 68  Temp:  98.4 F (36.9 C) 98.7 F (37.1 C) 98 F (36.7 C)  TempSrc:  Oral Oral Oral  Resp:  18 16 18   Height:      Weight: 120.1 kg (264 lb 12.4 oz)   118 kg (260 lb 2.3 oz)  SpO2:  95% 92% 91%    Intake/Output Summary (Last 24 hours) at 01/28/13 1448 Last data filed at 01/28/13 0830  Gross per 24 hour  Intake    720 ml  Output   1150 ml   Net   -430 ml   Filed Weights   01/26/13 0626 01/27/13 0622 01/28/13 0512  Weight: 123.4 kg (272 lb 0.8 oz) 120.1 kg (264 lb 12.4 oz) 118 kg (260 lb 2.3 oz)    Exam:   General:  Obese CM, No acute distress, increased confusion this morning.  Cannot hold train of thought  HEENT:  NCAT, MMM  Cardiovascular:  Distant heart sounds regular rhythm, no obvious murmurs, 2+ pulses, warm extremities  Respiratory:  CTAB, no increased WOB  Abdomen:   NABS, soft, NT/ND  MSK:   Normal tone and bulk, right arm ecchymotic from neck to elbow, in sling, grip 5-/5, 2+ pulse, trace pitting bilateral LEE  Neuro:  Grossly intact  Data Reviewed: Basic Metabolic Panel:  Recent Labs Lab 01/24/13 2350 01/25/13 0615 01/26/13 0506 01/27/13 0540 01/28/13 0524  NA 127* 134*  131* 133* 131* 133*  K 5.2 5.2  5.1 4.7 4.8 4.8  CL 91* 97  96 97 93* 94*  CO2 20 21  20 21 21 21   GLUCOSE 157* 114*  115* 187* 158* 234*  BUN 84* 83*  85* 90* 100* 113*  CREATININE 4.31* 3.90*  3.96* 3.29* 3.04* 2.91*  CALCIUM 8.6 8.6  8.4 8.7 8.9 9.1  MG  --  2.4  --   --   --   PHOS  --  6.7*  --   --   --    Liver Function Tests:  Recent Labs Lab 01/24/13 2350 01/25/13 0615  AST 66* 84*  84*  ALT 59* 81*  81*  ALKPHOS 145* 170*  176*  BILITOT 0.9 1.2  1.2  PROT 6.5 6.2  6.3  ALBUMIN 2.9* 2.8*  2.8*   No results found for this basename: LIPASE, AMYLASE,  in the last 168 hours No results found for this basename: AMMONIA,  in the last 168 hours CBC:  Recent Labs Lab 01/24/13 2350 01/25/13 0615 01/26/13 0506 01/27/13 0540  WBC 12.3* 12.7* 11.0* 12.7*  HGB 10.4* 9.4* 9.2* 9.4*  HCT 31.9* 28.5* 27.2* 28.0*  MCV 88.6 88.8 87.2 87.5  PLT 272 277 326 348   Cardiac Enzymes:  Recent Labs Lab 01/25/13 0615 01/25/13 1247 01/25/13 1815  TROPONINI <0.30 <0.30 <0.30   BNP (last 3 results)  Recent Labs  01/24/13 2350 01/25/13 0615  PROBNP 655.6* 785.0*   CBG:  Recent Labs Lab  01/27/13 1201 01/27/13 1748 01/27/13 2122 01/28/13 0734 01/28/13 1218  GLUCAP 227* 173* 137* 199* 232*    Recent Results (from the past 240 hour(s))  URINE CULTURE     Status: None   Collection Time    01/25/13  5:38 AM      Result Value Range Status   Specimen Description URINE, RANDOM   Final  Special Requests NONE   Final   Culture  Setup Time     Final   Value: 01/25/2013 12:04     Performed at [facility]     Final   Value: 3,000 COLONIES/ML     Performed at [facility]   Culture     Final   Value: INSIGNIFICANT GROWTH     Performed at [facility]   Report Status 01/26/2013 FINAL   Final  CULTURE, BLOOD (ROUTINE X 2)     Status: None   Collection Time    01/25/13 11:02 PM      Result Value Range Status   Specimen Description BLOOD LEFT HAND   Final   Special Requests BOTTLES DRAWN AEROBIC AND ANAEROBIC 10CC   Final   Culture  Setup Time     Final   Value: 01/26/2013 09:03     Performed at [facility]   Culture     Final   Value:        BLOOD CULTURE RECEIVED NO GROWTH TO DATE CULTURE WILL BE HELD FOR 5 DAYS BEFORE ISSUING A FINAL NEGATIVE REPORT     Performed at [facility]   Report Status PENDING   Incomplete  CULTURE, BLOOD (ROUTINE X 2)     Status: None   Collection Time    01/25/13 11:06 PM      Result Value Range Status   Specimen Description BLOOD LEFT ARM   Final   Special Requests BOTTLES DRAWN AEROBIC ONLY 4CC   Final   Culture  Setup Time     Final   Value: 01/26/2013 09:03     Performed at [facility]   Culture     Final   Value:        BLOOD CULTURE RECEIVED NO GROWTH TO DATE CULTURE WILL BE HELD FOR 5 DAYS BEFORE ISSUING A FINAL NEGATIVE REPORT     Performed at [facility]   Report Status PENDING   Incomplete     Studies: Nm Pulmonary Perf And Vent  01/26/2013   CLINICAL DATA:  Elevated D-dimer, evaluate for pulmonary embolism  EXAM: NUCLEAR MEDICINE VENTILATION -  PERFUSION LUNG SCAN  TECHNIQUE: Ventilation images were obtained in multiple projections using inhaled aerosol technetium 99 M DTPA. Perfusion images were obtained in multiple projections after intravenous injection of Tc-10m MAA.  COMPARISON:  Chest radiograph -01/25/2013  RADIOPHARMACEUTICALS:  40 mCi Tc-57m DTPA aerosol and 4 mCi Tc-42m MAA  FINDINGS: Review of chest radiograph performed 01/25/2013 demonstrates a large patient body habitus. Enlarged cardiac silhouette and mediastinal contours. There is mild diffuse slightly nodular thickening of the pulmonary interstitium. No focal airspace opacities. No pleural effusion or pneumothorax. No definite evidence of edema.  Ventilation: There is clumping of inhaled radiotracer about the bilateral pulmonary hila, right greater than left. There is otherwise adequate ventilation of the pulmonary parenchyma bilaterally. Ingested radiotracer is seen within the hypopharynx, esophagus and stomach.  Perfusion: There is homogeneous distribution of injected radiotracer throughout the bilateral pulmonary parenchyma. No discrete mismatched segmental or subsegmental filling defects to suggest pulmonary embolism.  IMPRESSION: Pulmonary embolus absent (very low probability for pulmonary embolism).   Electronically Signed   By: Sandi Mariscal M.D.   On: 01/26/2013 15:26   Dg Shoulder Right Port  01/26/2013   CLINICAL DATA:  Increasing pain  EXAM: PORTABLE RIGHT SHOULDER - 2+ VIEW  COMPARISON:  01/21/2013  FINDINGS: Single view of  the right shoulder reveals comminuted right humeral head/ neck fracture with slight separation of fracture fragments. Radiopaque material surrounding the fracture site may indicate early callus formation. Humeral head remains aligned with the glenoid on this single projection.  Visualized lungs clear.  Moderate acromioclavicular joint degenerative changes.  IMPRESSION: Single view of the right shoulder reveals comminuted right humeral head/ neck fracture with  slight separation of fracture fragments. Radiopaque material surrounding the fracture site may indicate early callus formation. Humeral head remains aligned with the glenoid on this single projection  Moderate acromioclavicular joint degenerative changes.   Electronically Signed   By: Chauncey Cruel M.D.   On: 01/26/2013 16:47    Scheduled Meds: . amLODipine  10 mg Oral Daily  . aspirin EC  325 mg Oral Daily  . atorvastatin  80 mg Oral QHS  . docusate sodium  100 mg Oral BID  . furosemide  80 mg Oral BID  . heparin  5,000 Units Subcutaneous Q8H  . insulin aspart  0-15 Units Subcutaneous TID WC  . insulin aspart  0-5 Units Subcutaneous QHS  . insulin aspart  5 Units Subcutaneous TID WC  . insulin glargine  42 Units Subcutaneous BID  . lisinopril  10 mg Oral Daily  . mupirocin cream   Topical BID  . oxyCODONE  5 mg Oral Q6H  . polyethylene glycol  17 g Oral Daily  . primidone  100 mg Oral Daily  . senna  2 tablet Oral QHS  . sevelamer carbonate  800 mg Oral TID WC  . sodium chloride  3 mL Intravenous Q12H   Continuous Infusions: . sodium chloride      Principal Problem:   Altered mental state Active Problems:   Acute on chronic kidney failure   Transaminitis   Leukocytosis   Anemia of chronic disease   Hyponatremia   Hyperglycemia   Acute on chronic diastolic heart failure   Humerus head fracture    Time spent: 30 min    Shannia Jacuinde, [facility]  [facility] Pager [phone]. If 7PM-7AM, please contact night-coverage at [email or web address], password [facility] 01/28/2013, 2:48 PM  LOS: 4 days

## 2013-01-28 NOTE — Plan of Care (Signed)
Problem: Food- and Nutrition-Related Knowledge Deficit (NB-1.1) Goal: Nutrition education Formal process to instruct or train a patient/client in a skill or to impart knowledge to help patients/clients voluntarily manage or modify food choices and eating behavior to maintain or improve health. Outcome: Completed/Met Date Met:  01/28/13 Nutrition Education Note  RD consulted for Renal Education. Provided Choose-A-Meal Booklet to patient/family. Reviewed food groups and provided written recommended serving sizes specifically determined for patient's current nutritional status.   Patient with CKD stage 4, elevated phosphorus. Potassium WNL. Patient has not received education prior to this.   Explained why diet restrictions are needed and provided lists of foods to limit/avoid that are high phosphorus, sodium, and potassium. Provided specific recommendations on safer alternatives of these foods. Strongly encouraged compliance of this diet.   Discussed importance of moderate portions of high quality protein intake of at each meal and snack. Discussed need for fluid restriction and strategies to limit fluid intake/control thirst..  Teach back method used.  Expect good compliance.  Body mass index is 35.27 kg/(m^2). Pt meets criteria for Obesity, Class 2 based on current BMI.  Current diet order is Renal 60/70, patient is consuming approximately 100% of meals at this time. Labs and medications reviewed. No further nutrition interventions warranted at this time. RD contact information provided. If additional nutrition issues arise, please re-consult RD.  Larey Seat, RD, LDN Pager #: [phone] After-Hours Pager #: [phone]

## 2013-01-29 LAB — BASIC METABOLIC PANEL
BUN: 132 mg/dL — AB (ref 6–23)
CO2: 20 mEq/L (ref 19–32)
CREATININE: 2.92 mg/dL — AB (ref 0.50–1.35)
Calcium: 9.3 mg/dL (ref 8.4–10.5)
Chloride: 93 mEq/L — ABNORMAL LOW (ref 96–112)
GFR calc non Af Amer: 20 mL/min — ABNORMAL LOW (ref >90)
GFR, EST AFRICAN AMERICAN: 24 mL/min — AB (ref >90)
Glucose, Bld: 184 mg/dL — ABNORMAL HIGH (ref 70–99)
Potassium: 4.8 mEq/L (ref 3.7–5.3)
Sodium: 132 mEq/L — ABNORMAL LOW (ref 137–147)

## 2013-01-29 LAB — UIFE/LIGHT CHAINS/TP QN, 24-HR UR
ALBUMIN, U: DETECTED
Alpha 1, Urine: DETECTED — AB
Alpha 2, Urine: DETECTED — AB
Beta, Urine: DETECTED — AB
FREE KAPPA/LAMBDA RATIO: 5.85 ratio (ref 2.04–10.37)
FREE LAMBDA LT CHAINS, UR: 2.41 mg/dL — AB (ref 0.02–0.67)
Free Kappa Lt Chains,Ur: 14.1 mg/dL — ABNORMAL HIGH (ref 0.14–2.42)
Gamma Globulin, Urine: DETECTED — AB
TOTAL PROTEIN, URINE-UPE24: 65.3 mg/dL

## 2013-01-29 LAB — CBC
HCT: 29.2 % — ABNORMAL LOW (ref 39.0–52.0)
HEMOGLOBIN: 9.8 g/dL — AB (ref 13.0–17.0)
MCH: 29.2 pg (ref 26.0–34.0)
MCHC: 33.6 g/dL (ref 30.0–36.0)
MCV: 86.9 fL (ref 78.0–100.0)
Platelets: 386 10*3/uL (ref 150–400)
RBC: 3.36 MIL/uL — ABNORMAL LOW (ref 4.22–5.81)
RDW: 13.4 % (ref 11.5–15.5)
WBC: 12.2 10*3/uL — ABNORMAL HIGH (ref 4.0–10.5)

## 2013-01-29 LAB — GLUCOSE, CAPILLARY
GLUCOSE-CAPILLARY: 217 mg/dL — AB (ref 70–99)
GLUCOSE-CAPILLARY: 266 mg/dL — AB (ref 70–99)
Glucose-Capillary: 184 mg/dL — ABNORMAL HIGH (ref 70–99)
Glucose-Capillary: 208 mg/dL — ABNORMAL HIGH (ref 70–99)

## 2013-01-29 LAB — PARATHYROID HORMONE, INTACT (NO CA): PTH: 219.8 pg/mL — AB (ref 14.0–72.0)

## 2013-01-29 MED ORDER — INSULIN GLARGINE 100 UNIT/ML ~~LOC~~ SOLN
45.0000 [IU] | Freq: Two times a day (BID) | SUBCUTANEOUS | Status: DC
  Administered 2013-01-29 – 2013-02-01 (×5): 45 [IU] via SUBCUTANEOUS
  Filled 2013-01-29 (×7): qty 0.45

## 2013-01-29 MED ORDER — LACTULOSE 10 GM/15ML PO SOLN
30.0000 g | Freq: Three times a day (TID) | ORAL | Status: DC
  Administered 2013-01-29 (×3): 30 g via ORAL
  Filled 2013-01-29 (×6): qty 45

## 2013-01-29 MED ORDER — CALCITRIOL 0.25 MCG PO CAPS
0.2500 ug | ORAL_CAPSULE | Freq: Every day | ORAL | Status: DC
  Administered 2013-01-29 – 2013-02-01 (×4): 0.25 ug via ORAL
  Filled 2013-01-29 (×4): qty 1

## 2013-01-29 MED ORDER — INSULIN ASPART 100 UNIT/ML ~~LOC~~ SOLN
7.0000 [IU] | Freq: Three times a day (TID) | SUBCUTANEOUS | Status: DC
  Administered 2013-01-31 – 2013-02-01 (×4): 7 [IU] via SUBCUTANEOUS

## 2013-01-29 MED ORDER — BISACODYL 5 MG PO TBEC
10.0000 mg | DELAYED_RELEASE_TABLET | Freq: Once | ORAL | Status: AC
  Administered 2013-01-29: 12:00:00 10 mg via ORAL
  Filled 2013-01-29: qty 2

## 2013-01-29 MED ORDER — DOCUSATE SODIUM 283 MG RE ENEM
1.0000 | ENEMA | Freq: Once | RECTAL | Status: AC
  Administered 2013-01-29: 283 mg via RECTAL
  Filled 2013-01-29: qty 1

## 2013-01-29 MED ORDER — SODIUM CHLORIDE 0.9 % IV SOLN
INTRAVENOUS | Status: AC
  Administered 2013-01-29 – 2013-01-31 (×4): via INTRAVENOUS

## 2013-01-29 NOTE — Progress Notes (Signed)
[facility] PROGRESS NOTE  Mitchell Garcia VOH:[id number] DOB: July 13, 1945 DOA: 01/24/2013 PCP: No primary provider on file.  Assessment/Plan  Altered mental status, initially due to lingering effects of narcotics and gabapentin which resolved.  Mentation improved today.    -  UDS positive for barbiturates due to primidone -  EtOH neg -  TSH 0.305, fT4 1.06 and fT3 2.  May be sick-euthyroid or mild central hypothyroidism (latter seems less likely) -  Continue ultram as needed for pain  -  BCx NGTD  Acute on chronic kidney disease stage IV.  Likely secondary to diabetes.  Vein mapping complete of left arm.  Creatinine trending down with diuresis but BUN rising with persistent confusion.  -  Appreciate Nephrology assistance -  FENa:  0.75% -  RUS:  Normal -  Hold lasix and ACEI and gentle hydration today -  Repeat BUN:Cr in AM -  Continue to follow mentation  Acute hypoxic respiratory failure, may be due to hypervolemia from CKD and acute diastolic heart failure.  Resolved with diuresis. -  D-dimer elevated, but VQ neg -  Pro-BNP mildly elevated (may be falsely low in this obese male) -  IS -  OOB  CHF with lower extremity edema and elevated BNP -  ECHO with grade 2 diastolic heart failure, preserved EF -  Continue CCB and ACEI  Bilateral lower extremity edema due to CKD and acute diastolic heart failure, resolved with diuresis  HTN, previously on norvasc, HCTZ, lisinopril.  BP wnl -  HCTZ not effective given CrCl -  Continue norvasc  Diabetes mellitus, uncontrolled with renal manifestations -  A1c 8.9 -  Increase to lantus 45 units bid -  Increase standing aspart with meals to 7 units and continue SSI  Hx of CVA -  Increased to FD ASA  -  Risk factor modification   Leukocytosis without fever.  UA neg, CXR without acute disease.  May be mildly elevated from recent humerus fracture. -  Trend WBC  Normocytic anemia, likely secondary to renal parenchymal disease -   iron studies consistent with chronic disease -  B12 < 400 so will start low dose B12 -  Folate wnl  -  SPEP pending.  IFE neg for monoclonal protein.    Hyponatremia may be due to fluid retention, stable.    Transaminitis, may be due to vascular congestion/hypervolemia or to NASH.  No history of EtOH abuse per family.  Generalized weakness -  PT/OT consults rec SNF  Right humerus fracture, improving -  Appreciate Orthopedics assistance -  Plan for follow up in 4-5 weeks.  Family to call for appointment  Constipation -  Repeat colace enema -  Continue colace, senna, miralax -  Add lactulose  Diet:  renal Access:  PIV IVF:  off Proph:  heparin  Code Status: full Family Communication: patient and his wife and daughter Disposition Plan:  To SNF for rehab when medically stable   Consultants:  Nephrology, Dr. Jonnie Finner  Orthopedics, Dr. Veverly Fells  Procedures:  RUS  VQ   CXR   CT head  Antibiotics:  none   HPI/Subjective:  Denies SOB, nausea, vomiting, diarrhea.  Constipated still and states his appetite is poor  Objective: Filed Vitals:   01/28/13 1455 01/28/13 2132 01/29/13 0600 01/29/13 1401  BP: 138/56 130/59 122/55 141/56  Pulse: 52 60 63 55  Temp: 98.6 F (37 C) 98.4 F (36.9 C) 98.4 F (36.9 C) 97.5 F (36.4 C)  TempSrc: Oral Oral Oral  Oral  Resp: 18 18 20 20   Height:      Weight:   117.8 kg (259 lb 11.2 oz)   SpO2: 100% 93% 91% 94%    Intake/Output Summary (Last 24 hours) at 01/29/13 1911 Last data filed at 01/29/13 1820  Gross per 24 hour  Intake 1017.5 ml  Output   2250 ml  Net -1232.5 ml   Filed Weights   01/27/13 0622 01/28/13 0512 01/29/13 0600  Weight: 120.1 kg (264 lb 12.4 oz) 118 kg (260 lb 2.3 oz) 117.8 kg (259 lb 11.2 oz)    Exam:   General:  Obese CM, No acute distress  HEENT:  NCAT, MMM  Cardiovascular:  Distant heart sounds regular rhythm, no obvious murmurs, 2+ pulses, warm extremities  Respiratory:  CTAB, no  increased WOB  Abdomen:   NABS, soft, NT/ND  MSK:   Normal tone and bulk, right arm ecchymotic from neck to elbow, in sling, grip 5-/5, 2+ pulse, no LEE   Neuro:  Grossly intact  Data Reviewed: Basic Metabolic Panel:  Recent Labs Lab 01/24/13 2350 01/25/13 0615 01/26/13 0506 01/27/13 0540 01/28/13 0524 01/29/13 0532  NA 127* 134*  131* 133* 131* 133* 132*  K 5.2 5.2  5.1 4.7 4.8 4.8 4.8  CL 91* 97  96 97 93* 94* 93*  CO2 20 21  20 21 21 21 20   GLUCOSE 157* 114*  115* 187* 158* 234* 184*  BUN 84* 83*  85* 90* 100* 113* 132*  CREATININE 4.31* 3.90*  3.96* 3.29* 3.04* 2.91* 2.92*  CALCIUM 8.6 8.6  8.4 8.7 8.9 9.1 9.3  MG  --  2.4  --   --   --   --   PHOS  --  6.7*  --   --   --   --    Liver Function Tests:  Recent Labs Lab 01/24/13 2350 01/25/13 0615  AST 66* 84*  84*  ALT 59* 81*  81*  ALKPHOS 145* 170*  176*  BILITOT 0.9 1.2  1.2  PROT 6.5 6.2  6.3  ALBUMIN 2.9* 2.8*  2.8*   No results found for this basename: LIPASE, AMYLASE,  in the last 168 hours No results found for this basename: AMMONIA,  in the last 168 hours CBC:  Recent Labs Lab 01/24/13 2350 01/25/13 0615 01/26/13 0506 01/27/13 0540 01/29/13 0532  WBC 12.3* 12.7* 11.0* 12.7* 12.2*  HGB 10.4* 9.4* 9.2* 9.4* 9.8*  HCT 31.9* 28.5* 27.2* 28.0* 29.2*  MCV 88.6 88.8 87.2 87.5 86.9  PLT 272 277 326 348 386   Cardiac Enzymes:  Recent Labs Lab 01/25/13 0615 01/25/13 1247 01/25/13 1815  TROPONINI <0.30 <0.30 <0.30   BNP (last 3 results)  Recent Labs  01/24/13 2350 01/25/13 0615  PROBNP 655.6* 785.0*   CBG:  Recent Labs Lab 01/28/13 1648 01/28/13 2143 01/29/13 0739 01/29/13 1222 01/29/13 1717  GLUCAP 165* 175* 184* 217* 208*    Recent Results (from the past 240 hour(s))  URINE CULTURE     Status: None   Collection Time    01/25/13  5:38 AM      Result Value Range Status   Specimen Description URINE, RANDOM   Final   Special Requests NONE   Final   Culture   Setup Time     Final   Value: 01/25/2013 12:04     Performed at [facility]     Final   Value: 3,000 COLONIES/ML  Performed at [facility]     Final   Value: INSIGNIFICANT GROWTH     Performed at [facility]   Report Status 01/26/2013 FINAL   Final  CULTURE, BLOOD (ROUTINE X 2)     Status: None   Collection Time    01/25/13 11:02 PM      Result Value Range Status   Specimen Description BLOOD LEFT HAND   Final   Special Requests BOTTLES DRAWN AEROBIC AND ANAEROBIC 10CC   Final   Culture  Setup Time     Final   Value: 01/26/2013 09:03     Performed at [facility]   Culture     Final   Value:        BLOOD CULTURE RECEIVED NO GROWTH TO DATE CULTURE WILL BE HELD FOR 5 DAYS BEFORE ISSUING A FINAL NEGATIVE REPORT     Performed at [facility]   Report Status PENDING   Incomplete  CULTURE, BLOOD (ROUTINE X 2)     Status: None   Collection Time    01/25/13 11:06 PM      Result Value Range Status   Specimen Description BLOOD LEFT ARM   Final   Special Requests BOTTLES DRAWN AEROBIC ONLY 4CC   Final   Culture  Setup Time     Final   Value: 01/26/2013 09:03     Performed at [facility]   Culture     Final   Value:        BLOOD CULTURE RECEIVED NO GROWTH TO DATE CULTURE WILL BE HELD FOR 5 DAYS BEFORE ISSUING A FINAL NEGATIVE REPORT     Performed at [facility]   Report Status PENDING   Incomplete     Studies: No results found.  Scheduled Meds: . amLODipine  10 mg Oral Daily  . aspirin EC  325 mg Oral Daily  . atorvastatin  80 mg Oral QHS  . calcitRIOL  0.25 mcg Oral Daily  . docusate sodium  100 mg Oral BID  . heparin  5,000 Units Subcutaneous Q8H  . insulin aspart  0-15 Units Subcutaneous TID WC  . insulin aspart  0-5 Units Subcutaneous QHS  . insulin aspart  5 Units Subcutaneous TID WC  . insulin glargine  42 Units Subcutaneous BID  . lactulose  30 g Oral TID  . mupirocin cream    Topical BID  . polyethylene glycol  17 g Oral Daily  . primidone  100 mg Oral Daily  . senna  2 tablet Oral QHS  . sevelamer carbonate  800 mg Oral TID WC  . sodium chloride  3 mL Intravenous Q12H  . traMADol  100 mg Oral BID   Continuous Infusions: . sodium chloride    . sodium chloride 75 mL/hr at 01/29/13 1238    Principal Problem:   Altered mental state Active Problems:   Acute on chronic kidney failure   Transaminitis   Leukocytosis   Anemia of chronic disease   Hyponatremia   Hyperglycemia   Acute on chronic diastolic heart failure   Humerus head fracture    Time spent: 30 min    Deionna Marcantonio, [facility]  [facility] Pager [phone]. If 7PM-7AM, please contact night-coverage at [email or web address], password [facility] 01/29/2013, 7:11 PM  LOS: 5 days

## 2013-01-29 NOTE — Progress Notes (Addendum)
[facility] Progress Note    Subjective:  Dr. Jonnie Finner signed off on 1/3. Asked to see pt again re possible dialysis indications Chart reviewed, pt seen and examined.  More azotemic but creatinine actually stable. My first time seeing him. He is awake, and knows where he is, oriented to situation. Wants to watch a ball game on TV. Denies nausea/vomiting or SOB Biggest complaint is shoulder pain. Wearing condom cath. Says urinating freely.    Filed Vitals:   01/28/13 1455 01/28/13 2132 01/29/13 0600 01/29/13 1401  BP: 138/56 130/59 122/55 141/56  Pulse: 52 60 63 55  Temp: 98.6 F (37 C) 98.4 F (36.9 C) 98.4 F (36.9 C) 97.5 F (36.4 C)  TempSrc: Oral Oral Oral Oral  Resp: _0 Height:      Weight:   117.8 kg (259 lb 11.2 oz)   SpO2: 100% 93% 91% 94%   Weight trending: 01/29/13 0600 117.8 kg (259 lb 11.2 oz) 01/28/13 0512 118 kg (260 lb 2.3 oz) 01/27/13 0622 120.1 kg (264 lb 12.4 oz) 01/26/13 0626 123.4 kg (272 lb 0.8 oz)  01/25/13 0450 124.8 kg (275 lb 2.2 oz)  Exam Large framed older WM Alert, no distress.  Speaks slowly and deliberately but not inappropriate at this time No JVD Clear bilat RRR no rub or M Large amt of ecchymotic change right shoulder/upper arm; in sling Abd soft, nt/nd Condom cath in place No LE edema No asterixis noted  UA 100 protein, 0-2 wbc and rbc  Renal US 12 cm kidneys, no hydro, nl echo  Assessment/Plan:  1.  CKD stage 4b: creatinine has improved from 4.3 to 2.9 and is stable there X 3 days.  BUN has risen - a likely effect of diuresis (weight down a total of 7 kg since 1/1).  I am not convinced however that this is symptomatic azotemia (My assessment is limited in that this is the first time I have met him)   As dicussed this AM with Dr. Sheran Fava, agree with holding diuretics, giving at least 24 hours of gentle IVF, and continue to reassess daily.  Would also hold lisinopril again, and see if further improvement in  renal function.  (BP looks OK at this point).  I do not see any hard indications for dialysis.  Will f/u tomorrow. 2.  Anemia CKD: Hb 9.4, no Rx at this time 3.  MBD/CKD: phos up 6.7, adjCa ok at 9.4, , started renvela as binder; start calcitriol 0.25 mcg/day for PTH of 219  4.  DM with neuropathy 5.  Hx CVA 6.  Diastolic dysfunction by echo, grade 2 7.  Right proximal humerus fracture d/t fall - plan is for "closed management"; narcotics being used sparingly with MS issues   Jaicion Laurie B, MD  Additional objective data:  Recent Labs Lab 01/24/13 2350 01/25/13 0615  01/27/13 0540 01/28/13 0524 01/29/13 0532  NA 127* 134*  131*  < > 131* 133* 132*  K 5.2 5.2  5.1  < > 4.8 4.8 4.8  CL 91* 97  96  < > 93* 94* 93*  CO2 _1 < > _2 GLUCOSE 157* 114*  115*  < > 158* 234* 184*  BUN 84* 83*  85*  < > 100* 113* 132*  CREATININE 4.31* 3.90*  3.96*  < > 3.04* 2.91* 2.92*  CALCIUM 8.6 8.6  8.4  < > 8.9 9.1 9.3  PHOS  --  6.7*  --   --   --   --   < > =  values in this interval not displayed.  Recent Labs Lab 01/24/13 2350 01/25/13 0615  AST 66* 84*  84*  ALT 59* 81*  81*  ALKPHOS 145* 170*  176*  BILITOT 0.9 1.2  1.2  PROT 6.5 6.2  6.3  ALBUMIN 2.9* 2.8*  2.8*    Results for CALIX, HEINBAUGH (MRN [id number]) as of 01/29/2013 17:29  Ref. Range 01/26/2013 05:06  PTH Latest Range: 14.0-72.0 pg/mL 219.8 (H)    Recent Labs Lab 01/26/13 0506 01/27/13 0540 01/29/13 0532  WBC 11.0* 12.7* 12.2*  HGB 9.2* 9.4* 9.8*  HCT 27.2* 28.0* 29.2*  MCV 87.2 87.5 86.9  PLT 326 348 386   Current Medications: . amLODipine  10 mg Oral Daily  . aspirin EC  325 mg Oral Daily  . atorvastatin  80 mg Oral QHS  . docusate sodium  100 mg Oral BID  . heparin  5,000 Units Subcutaneous Q8H  . insulin aspart  0-15 Units Subcutaneous TID WC  . insulin aspart  0-5 Units Subcutaneous QHS  . insulin aspart  5 Units Subcutaneous TID WC  . insulin glargine  42 Units  Subcutaneous BID  . lactulose  30 g Oral TID  . mupirocin cream   Topical BID  . polyethylene glycol  17 g Oral Daily  . primidone  100 mg Oral Daily  . senna  2 tablet Oral QHS  . sevelamer carbonate  800 mg Oral TID WC  . sodium chloride  3 mL Intravenous Q12H  . traMADol  100 mg Oral BID   . sodium chloride    . sodium chloride 75 mL/hr at 01/29/13 1238   acetaminophen, ondansetron (ZOFRAN) IV, ondansetron

## 2013-01-30 LAB — GLUCOSE, CAPILLARY
GLUCOSE-CAPILLARY: 162 mg/dL — AB (ref 70–99)
Glucose-Capillary: 149 mg/dL — ABNORMAL HIGH (ref 70–99)
Glucose-Capillary: 172 mg/dL — ABNORMAL HIGH (ref 70–99)
Glucose-Capillary: 200 mg/dL — ABNORMAL HIGH (ref 70–99)

## 2013-01-30 LAB — PROTEIN ELECTROPHORESIS, SERUM
ALPHA-2-GLOBULIN: 19.1 % — AB (ref 7.1–11.8)
Albumin ELP: 44.1 % — ABNORMAL LOW (ref 55.8–66.1)
Alpha-1-Globulin: 11.4 % — ABNORMAL HIGH (ref 2.9–4.9)
Beta 2: 7.8 % — ABNORMAL HIGH (ref 3.2–6.5)
Beta Globulin: 7.2 % (ref 4.7–7.2)
Gamma Globulin: 10.4 % — ABNORMAL LOW (ref 11.1–18.8)
M-Spike, %: NOT DETECTED g/dL
TOTAL PROTEIN ELP: 5.5 g/dL — AB (ref 6.0–8.3)

## 2013-01-30 LAB — RENAL FUNCTION PANEL
Albumin: 2.5 g/dL — ABNORMAL LOW (ref 3.5–5.2)
BUN: 134 mg/dL — AB (ref 6–23)
CHLORIDE: 95 meq/L — AB (ref 96–112)
CO2: 20 mEq/L (ref 19–32)
Calcium: 9.4 mg/dL (ref 8.4–10.5)
Creatinine, Ser: 2.57 mg/dL — ABNORMAL HIGH (ref 0.50–1.35)
GFR calc Af Amer: 28 mL/min — ABNORMAL LOW (ref >90)
GFR calc non Af Amer: 24 mL/min — ABNORMAL LOW (ref >90)
Glucose, Bld: 167 mg/dL — ABNORMAL HIGH (ref 70–99)
POTASSIUM: 5.7 meq/L — AB (ref 3.7–5.3)
Phosphorus: 6.2 mg/dL — ABNORMAL HIGH (ref 2.3–4.6)
Sodium: 132 mEq/L — ABNORMAL LOW (ref 137–147)

## 2013-01-30 LAB — POTASSIUM: POTASSIUM: 4.7 meq/L (ref 3.7–5.3)

## 2013-01-30 MED ORDER — LACTULOSE 10 GM/15ML PO SOLN
30.0000 g | Freq: Three times a day (TID) | ORAL | Status: DC
  Filled 2013-01-30 (×3): qty 45

## 2013-01-30 MED ORDER — SODIUM POLYSTYRENE SULFONATE 15 GM/60ML PO SUSP
45.0000 g | Freq: Once | ORAL | Status: AC
  Administered 2013-01-30: 45 g via ORAL
  Filled 2013-01-30: qty 180

## 2013-01-30 MED ORDER — BOOST / RESOURCE BREEZE PO LIQD
1.0000 | Freq: Every day | ORAL | Status: DC
  Administered 2013-01-30 – 2013-02-01 (×3): 1 via ORAL

## 2013-01-30 MED ORDER — LACTULOSE 10 GM/15ML PO SOLN
45.0000 g | Freq: Once | ORAL | Status: DC
  Filled 2013-01-30: qty 90

## 2013-01-30 NOTE — Progress Notes (Signed)
CSW spoke with patient's wife & granddaughter at bedside re: discharge planning. CSW had faxed referral to [facility], awaiting call back re: bed offer/availability. CSW also faxed patient out to [facility] - patient's family is leaning towards [facility] or a [place] contracted facility ([place] or [place]) as backups incase the [place] in [place] accept patient.   CSW will follow-up tomorrow.   Clinical Social Work Department CLINICAL SOCIAL WORK PLACEMENT NOTE 01/30/2013  Patient:  Mitchell Garcia, Mitchell Garcia  Account Number:  [id number] Admit date:  01/24/2013  Clinical Social Worker:  Renold Genta  Date/time:  01/26/2013 03:46 PM  Clinical Social Work is seeking post-discharge placement for this patient at the following level of care:   SKILLED NURSING   (*CSW will update this form in Epic as items are completed)   01/26/2013  Patient/family provided with [place] Department of Clinical Social Work's list of facilities offering this level of care within the geographic area requested by the patient (or if unable, by the patient's family).  01/26/2013  Patient/family informed of their freedom to choose among providers that offer the needed level of care, that participate in Medicare, Medicaid or managed care program needed by the patient, have an available bed and are willing to accept the patient.  01/26/2013  Patient/family informed of [facility]' ownership interest in [facility], as well as of the fact that they are under no obligation to receive care at this facility.  PASARR submitted to EDS on 01/26/2013 PASARR number received from EDS on 01/26/2013  FL2 transmitted to all facilities in geographic area requested by pt/family on  01/26/2013 FL2 transmitted to all facilities within larger geographic area on   Patient informed that his/her managed care company has contracts with or will negotiate with  certain facilities, including  the following:     Patient/family informed of bed offers received:  01/30/2013 Patient chooses bed at  Physician recommends and patient chooses bed at    Patient to be transferred to  on   Patient to be transferred to facility by   The following physician request were entered in Epic:   Additional Comments:   Winfred Leeds, [place] Worker cell #: [phone]

## 2013-01-30 NOTE — Progress Notes (Signed)
PT Cancellation Note  Patient Details Name: Mitchell Garcia MRN: [id number] DOB: 06/21/1945   Cancelled Treatment:    Reason Eval/Treat Not Completed: Medical issues which prohibited therapy Lab values exceeding levels safe for exertion with therapy. Will check back after 2:00 to see new lab values for this afternoon and hopefully be able to work with patient at that time.     Alta Corning, [place] Pager: [phone] 01/30/2013  01/30/2013, 11:28 AM

## 2013-01-30 NOTE — Progress Notes (Signed)
OT Cancellation Note  Patient Details Name: Mitchell Garcia MRN: [id number] DOB: 15-Mar-1945   Cancelled Treatment:    Reason Eval/Treat Not Completed: Medical issues which prohibited therapy.  Noted K+ is 5.7.  Will check back this afternoon if schedule permits.    Latasha Buczkowski \ 01/30/2013, 11:48 AM Lesle Chris, OTR/L [phone] 01/30/2013

## 2013-01-30 NOTE — Progress Notes (Signed)
Noted K+level this morning 5.7 from 4.8 yesterday. On call NP K schorr notified and new order received lactulose 45 gm po this am and to recheck  K+ level at 1500. Will carry out MD order.

## 2013-01-30 NOTE — Progress Notes (Signed)
PT Cancellation Note  Patient Details Name: Mitchell Garcia MRN: [id number] DOB: Nov 26, 1945   Cancelled Treatment:      Checked on pt again this afternoon. Potassium back to normal level, however pt has been trying to eat and finally working on dinner at this time. Will check on pt tomorrow. Sat at bedside and encouraged pt to work with PT and need for increasing his mobility. Pt agreed to work tomorrow.     Clide Dales 01/30/2013, 5:12 PM

## 2013-01-30 NOTE — Progress Notes (Addendum)
[facility] Progress Note    Subjective:  K was up this AM requiring kayexelate (said it made him sick) Wasn't nauseous until he took it Denies SOB Says shoulder is "rough"   Filed Vitals:   01/29/13 0600 01/29/13 1401 01/29/13 2059 01/30/13 0625  BP: 122/55 141/56 131/55 139/51  Pulse: 63 55 65 62  Temp: 98.4 F (36.9 C) 97.5 F (36.4 C) 97.9 F (36.6 C) 97.3 F (36.3 C)  TempSrc: Oral Oral Oral Oral  Resp: 20 20 19 18   Height:      Weight: 117.8 kg (259 lb 11.2 oz)   115.3 kg (254 lb 3.1 oz)  SpO2: 91% 94% 95% 96%   Weight trending: 01/30/13 0625 115.3 kg (254 lb 3.1 oz) 01/29/13 0600 117.8 kg (259 lb 11.2 oz) 01/28/13 0512 118 kg (260 lb 2.3 oz) 01/27/13 0622 120.1 kg (264 lb 12.4 oz) 01/26/13 0626 123.4 kg (272 lb 0.8 oz)  01/25/13 0450 124.8 kg (275 lb 2.2 oz)  Exam Large framed older WM Alert, no distress. Doesn't know what potassium is but doesn't want "any more of that medicine" No JVD Clear bilat RRR no rub or M Large amt of ecchymotic change right shoulder/upper arm; in sling Abd soft, nt/nd Condom cath in place No LE edema No asterixis   Additional objective data:  Recent Labs Lab 01/24/13 2350 01/25/13 0615  01/28/13 0524 01/29/13 0532 01/30/13 0530  NA 127* 134*  131*  < > 133* 132* 132*  K 5.2 5.2  5.1  < > 4.8 4.8 5.7*  CL 91* 97  96  < > 94* 93* 95*  CO2 20 21  20   < > 21 20 20   GLUCOSE 157* 114*  115*  < > 234* 184* 167*  BUN 84* 83*  85*  < > 113* 132* 134*  CREATININE 4.31* 3.90*  3.96*  < > 2.91* 2.92* 2.57*  CALCIUM 8.6 8.6  8.4  < > 9.1 9.3 9.4  PHOS  --  6.7*  --   --   --  6.2*  < > = values in this interval not displayed.  Recent Labs Lab 01/24/13 2350 01/25/13 0615 01/30/13 0530  AST 66* 84*  84*  --   ALT 59* 81*  81*  --   ALKPHOS 145* 170*  176*  --   BILITOT 0.9 1.2  1.2  --   PROT 6.5 6.2  6.3  --   ALBUMIN 2.9* 2.8*  2.8* 2.5*    Results for UDAY, JANTZ (MRN [id number]) as  of 01/29/2013 17:29  Ref. Range 01/26/2013 05:06  PTH Latest Range: 14.0-72.0 pg/mL 219.8 (H)    Recent Labs Lab 01/26/13 0506 01/27/13 0540 01/29/13 0532  WBC 11.0* 12.7* 12.2*  HGB 9.2* 9.4* 9.8*  HCT 27.2* 28.0* 29.2*  MCV 87.2 87.5 86.9  PLT 326 348 386   Current Medications: . amLODipine  10 mg Oral Daily  . aspirin EC  325 mg Oral Daily  . atorvastatin  80 mg Oral QHS  . calcitRIOL  0.25 mcg Oral Daily  . docusate sodium  100 mg Oral BID  . feeding supplement (RESOURCE BREEZE)  1 Container Oral Daily  . heparin  5,000 Units Subcutaneous Q8H  . insulin aspart  0-15 Units Subcutaneous TID WC  . insulin aspart  0-5 Units Subcutaneous QHS  . insulin aspart  7 Units Subcutaneous TID WC  . insulin glargine  45 Units Subcutaneous BID  . lactulose  30  g Oral TID  . mupirocin cream   Topical BID  . polyethylene glycol  17 g Oral Daily  . primidone  100 mg Oral Daily  . senna  2 tablet Oral QHS  . sevelamer carbonate  800 mg Oral TID WC  . sodium chloride  3 mL Intravenous Q12H  . traMADol  100 mg Oral BID   . sodium chloride    . sodium chloride 75 mL/hr at 01/30/13 1347   acetaminophen, ondansetron (ZOFRAN) IV, ondansetron   Assessment/Plan:  1.  CKD stage 4b: creatinine has improved from 4.3 to 2.57 and is improved past 24 hours.  BUN stable high 130's.  K up to 5.7 today - rec'd kayexelate - needs followup.  Weight has continued to drop (if weight correct) despite holding furosemide and giving IVF at 75/hour. Does not look overloaded at this time.  Mentally seems brighter today.  Remains without dialysis indications.  Should be able to manage K issues conservatively.  (Hyperkalemia would in my mind be contraindication to any future use of ACE or ARB) . Continue IVF through tomorrow AM then discontinue.  Probably restart lasix at a lower dose of 80 QD at that point (that will help with urinary K excretion).  Followup renal fx in AM 2.  Anemia CKD: Hb 9.8  no Rx at this  time 3.  MBD/CKD: , started renvela as binder; started calcitriol 0.25 mcg/day for PTH of 219  4.  DM with neuropathy 5.  Hx CVA 6.  Diastolic dysfunction by echo, grade 2 - no clinical CHF at this time 7.  Right proximal humerus fracture d/t fall - plan is for "closed management"; narcotics being used sparingly with MS issues 8.  Decreased health literacy - poor illness understanding; and says "not much education"  May affect in some ways assessment of mental status   Shourya Macpherson B, MD

## 2013-01-30 NOTE — Progress Notes (Signed)
INITIAL NUTRITION ASSESSMENT  DOCUMENTATION CODES Per approved criteria  -Obesity Unspecified   INTERVENTION: Recommend Resource Breeze once daily Renal diet education -1/04  NUTRITION DIAGNOSIS: Inadequate oral intake related to AMS/renal diet as evidenced by PO intake <75% est nutritional needs.   Goal: Pt to meet >/= 90% of their estimated nutrition needs   Monitor:  Total protein/energy intake, renal profile, glucose profile, labs, weights  Reason for Assessment: RD Discretion/OT Consult  68 y.o. male  Admitting Dx: Altered mental state  ASSESSMENT: Patient is a 68 y.o. male presenting with altered mental status. He was in the ED 4 days ago following a fall with a.m. fracture of the right humerus and was discharged home with prescription for oxycodone-acetaminophen. Today, he was noted to be lethargic and confused. He has only been taking a few oxycodone acetaminophen tablets a day. There's been no fever or chills. There's been no vomiting or diarrhea. He has not been on any other new medications.   -Pt has been educated on renal diet on 1/04 -Was consulted by OT for diet education/assessment d/t family's concern for pt's declining PO intake -Denied any changes in appetite or weight prior to admission. Family believes he has lost some weight, unable to quantify amount, but has evident loss around jaw and facial region. -Appetite significantly declining during 7 days of admission, eating approx 25%. MD requested family not bring in outside food sources d/t abnormal renal profile -Phos and K elevated -Pt may benefit from [employer] as this is a lower K/phos supplement alternative. Pt is DM with elevated blood glucose, will recommend supplement once daily   Height: Ht Readings from Last 1 Encounters:  01/25/13 6' (1.829 m)    Weight: Wt Readings from Last 1 Encounters:  01/30/13 254 lb 3.1 oz (115.3 kg)    Ideal Body Weight: 178 lbs  % Ideal Body Weight:  144%  Wt Readings from Last 10 Encounters:  01/30/13 254 lb 3.1 oz (115.3 kg)    Usual Body Weight: unable to determine  % Usual Body Weight: unable to determine  BMI:  Body mass index is 34.47 kg/(m^2). Obesity II  Estimated Nutritional Needs: Kcal: 2400-2600 Protein: 65-80 gram Fluid:1200 ml fluid restriction per MD  Skin: WDL  Diet Order: Renal  EDUCATION NEEDS: -Education needs addressed    Intake/Output Summary (Last 24 hours) at 01/30/13 1355 Last data filed at 01/30/13 0900  Gross per 24 hour  Intake 1811.25 ml  Output   2000 ml  Net -188.75 ml    Last BM: 1/05   Labs:   Recent Labs Lab 01/24/13 2350 01/25/13 0615  01/28/13 0524 01/29/13 0532 01/30/13 0530  NA 127* 134*  131*  < > 133* 132* 132*  K 5.2 5.2  5.1  < > 4.8 4.8 5.7*  CL 91* 97  96  < > 94* 93* 95*  CO2 20 21  20   < > 21 20 20   BUN 84* 83*  85*  < > 113* 132* 134*  CREATININE 4.31* 3.90*  3.96*  < > 2.91* 2.92* 2.57*  CALCIUM 8.6 8.6  8.4  < > 9.1 9.3 9.4  MG  --  2.4  --   --   --   --   PHOS  --  6.7*  --   --   --  6.2*  GLUCOSE 157* 114*  115*  < > 234* 184* 167*  < > = values in this interval not displayed.  CBG (last 3)  Recent Labs  01/29/13 2132 01/30/13 0750 01/30/13 1206  GLUCAP 266* 149* 162*    Scheduled Meds: . amLODipine  10 mg Oral Daily  . aspirin EC  325 mg Oral Daily  . atorvastatin  80 mg Oral QHS  . calcitRIOL  0.25 mcg Oral Daily  . docusate sodium  100 mg Oral BID  . heparin  5,000 Units Subcutaneous Q8H  . insulin aspart  0-15 Units Subcutaneous TID WC  . insulin aspart  0-5 Units Subcutaneous QHS  . insulin aspart  7 Units Subcutaneous TID WC  . insulin glargine  45 Units Subcutaneous BID  . lactulose  30 g Oral TID  . mupirocin cream   Topical BID  . polyethylene glycol  17 g Oral Daily  . primidone  100 mg Oral Daily  . senna  2 tablet Oral QHS  . sevelamer carbonate  800 mg Oral TID WC  . sodium chloride  3 mL Intravenous Q12H   . traMADol  100 mg Oral BID    Continuous Infusions: . sodium chloride    . sodium chloride 75 mL/hr at 01/30/13 1347    Past Medical History  Diagnosis Date  . Diabetes mellitus with renal complications     uncontrolled  . Hypertension   . Stroke   . MI, old     years ago  . Cancer   . CHF (congestive heart failure)   . Chronic kidney disease     History reviewed. No pertinent past surgical history.  Atlee Abide MS RD LDN Clinical Dietitian [phone]

## 2013-01-30 NOTE — Progress Notes (Addendum)
[facility] PROGRESS NOTE  Mitchell Garcia [id number] DOB: 1945-04-28 DOA: 01/24/2013 PCP: No primary provider on file.  68 yo M with diabetes, CKD, diastolic heart failure who had a recent right humeral head fracture presented to with lethargy.  He was found to have acute diastolic heart failure and diuresed, however, his BUN has trended up.  Nephrology is consulting to determine if he will need hemodialysis during this admission.  Mentation improving, but appetite decreasing and starting to have nausea.    Assessment/Plan  Altered mental status, initially due to lingering effects of narcotics and gabapentin which resolved.  Mentation improving.  -  UDS positive for barbiturates due to primidone -  EtOH neg -  TSH 0.305, fT4 1.06 and fT3 2.  May be sick-euthyroid or mild central hypothyroidism (latter seems less likely) -  Continue ultram as needed for pain  -  BCx NGTD  Acute on chronic kidney disease stage IV.  Likely secondary to diabetes.  Vein mapping complete of left arm.  Creatinine trending down with diuresis but BUN rising with persistent confusion.  -  Appreciate Nephrology assistance -  FENa:  0.75% -  RUS:  Normal -  Hold lasix and ACEI  -  Continue gentle hydration today -  Repeat BUN:Cr in AM  Hyperkalemia today -  Added ACEI/ARB to intolerance list -  Kayexalate made him vomit, but potassium trended down -  Repeat in AM  Acute hypoxic respiratory failure, may be due to hypervolemia from CKD and acute diastolic heart failure.  Resolved with diuresis. -  D-dimer elevated, but VQ neg -  Pro-BNP mildly elevated (may be falsely low in this obese male) -  IS and OOB  CHF with lower extremity edema and elevated BNP -  ECHO with grade 2 diastolic heart failure, preserved EF -  Continue CCB and ACEI  Bilateral lower extremity edema due to CKD and acute diastolic heart failure, resolved with diuresis  HTN, previously on norvasc, HCTZ, lisinopril.  BP wnl -   HCTZ not effective given CrCl -  Continue norvasc  Diabetes mellitus, uncontrolled with renal manifestations -  A1c 8.9 -  Increase to lantus 45 units bid -  Increase standing aspart with meals to 7 units and continue SSI  Hx of CVA -  Increased to FD ASA  -  Risk factor modification   Leukocytosis without fever.  UA neg, CXR without acute disease.  May be mildly elevated from recent humerus fracture. -  Trend WBC  Normocytic anemia, likely secondary to renal parenchymal disease -  iron studies consistent with chronic disease -  B12 < 400 so will start low dose B12 -  Folate wnl  -  SPEP pending.  IFE neg for monoclonal protein.    Hyponatremia may be due to fluid retention, stable.    Transaminitis, may be due to vascular congestion/hypervolemia or to NASH.  No history of EtOH abuse per family.  Generalized weakness -  PT/OT consults rec SNF  Right humerus fracture, improving -  Appreciate Orthopedics assistance -  Plan for follow up in 4-5 weeks.  Family to call for appointment  Constipation, resolving  -  Repeat colace enema -  Continue colace, senna, miralax -  D/c lactulose  Diet:  renal Access:  PIV IVF:  off Proph:  heparin  Code Status: full Family Communication: patient and his wife and daughter Disposition Plan:  To SNF for rehab when medically stable   Consultants:  Nephrology, Dr. Jonnie Finner  Orthopedics, Dr. Veverly Fells  Procedures:  RUS  VQ   CXR   CT head  Antibiotics:  none   HPI/Subjective:  Had two BMs overnight.  Vomited with kayexelate this AM.    Objective: Filed Vitals:   01/29/13 2059 01/30/13 0625 01/30/13 1457 01/30/13 2017  BP: 131/55 139/51 134/60 150/62  Pulse: 65 62 51 60  Temp: 97.9 F (36.6 C) 97.3 F (36.3 C) 97.9 F (36.6 C) 98.4 F (36.9 C)  TempSrc: Oral Oral Oral Oral  Resp: 19 18 18 18   Height:      Weight:  115.3 kg (254 lb 3.1 oz)    SpO2: 95% 96% 95% 97%    Intake/Output Summary (Last 24 hours) at  01/30/13 2048 Last data filed at 01/30/13 2019  Gross per 24 hour  Intake   2100 ml  Output   2300 ml  Net   -200 ml   Filed Weights   01/28/13 0512 01/29/13 0600 01/30/13 0625  Weight: 118 kg (260 lb 2.3 oz) 117.8 kg (259 lb 11.2 oz) 115.3 kg (254 lb 3.1 oz)    Exam:   General:  Obese CM, No acute distress, more alert today  HEENT:  NCAT, MMM  Cardiovascular:  Distant heart sounds regular rhythm, no obvious murmurs, 2+ pulses, warm extremities  Respiratory:  CTAB, no increased WOB  Abdomen:   NABS, soft, NT/ND  MSK:   Normal tone and bulk, right arm ecchymotic from neck to elbow, in sling, grip 5-/5, 2+ pulse, no LEE   Neuro:  Grossly intact  Data Reviewed: Basic Metabolic Panel:  Recent Labs Lab 01/24/13 2350 01/25/13 0615 01/26/13 0506 01/27/13 0540 01/28/13 0524 01/29/13 0532 01/30/13 0530 01/30/13 1444  NA 127* 134*  131* 133* 131* 133* 132* 132*  --   K 5.2 5.2  5.1 4.7 4.8 4.8 4.8 5.7* 4.7  CL 91* 97  96 97 93* 94* 93* 95*  --   CO2 20 21  20 21 21 21 20 20   --   GLUCOSE 157* 114*  115* 187* 158* 234* 184* 167*  --   BUN 84* 83*  85* 90* 100* 113* 132* 134*  --   CREATININE 4.31* 3.90*  3.96* 3.29* 3.04* 2.91* 2.92* 2.57*  --   CALCIUM 8.6 8.6  8.4 8.7 8.9 9.1 9.3 9.4  --   MG  --  2.4  --   --   --   --   --   --   PHOS  --  6.7*  --   --   --   --  6.2*  --    Liver Function Tests:  Recent Labs Lab 01/24/13 2350 01/25/13 0615 01/30/13 0530  AST 66* 84*  84*  --   ALT 59* 81*  81*  --   ALKPHOS 145* 170*  176*  --   BILITOT 0.9 1.2  1.2  --   PROT 6.5 6.2  6.3  --   ALBUMIN 2.9* 2.8*  2.8* 2.5*   No results found for this basename: LIPASE, AMYLASE,  in the last 168 hours No results found for this basename: AMMONIA,  in the last 168 hours CBC:  Recent Labs Lab 01/24/13 2350 01/25/13 0615 01/26/13 0506 01/27/13 0540 01/29/13 0532  WBC 12.3* 12.7* 11.0* 12.7* 12.2*  HGB 10.4* 9.4* 9.2* 9.4* 9.8*  HCT 31.9* 28.5* 27.2*  28.0* 29.2*  MCV 88.6 88.8 87.2 87.5 86.9  PLT 272 277 326 348 386   Cardiac Enzymes:  Recent Labs Lab 01/25/13 0615 01/25/13 1247 01/25/13 1815  TROPONINI <0.30 <0.30 <0.30   BNP (last 3 results)  Recent Labs  01/24/13 2350 01/25/13 0615  PROBNP 655.6* 785.0*   CBG:  Recent Labs Lab 01/29/13 1717 01/29/13 2132 01/30/13 0750 01/30/13 1206 01/30/13 1708  GLUCAP 208* 266* 149* 162* 172*    Recent Results (from the past 240 hour(s))  URINE CULTURE     Status: None   Collection Time    01/25/13  5:38 AM      Result Value Range Status   Specimen Description URINE, RANDOM   Final   Special Requests NONE   Final   Culture  Setup Time     Final   Value: 01/25/2013 12:04     Performed at [facility]     Final   Value: 3,000 COLONIES/ML     Performed at [facility]   Culture     Final   Value: INSIGNIFICANT GROWTH     Performed at [facility]   Report Status 01/26/2013 FINAL   Final  CULTURE, BLOOD (ROUTINE X 2)     Status: None   Collection Time    01/25/13 11:02 PM      Result Value Range Status   Specimen Description BLOOD LEFT HAND   Final   Special Requests BOTTLES DRAWN AEROBIC AND ANAEROBIC 10CC   Final   Culture  Setup Time     Final   Value: 01/26/2013 09:03     Performed at [facility]   Culture     Final   Value:        BLOOD CULTURE RECEIVED NO GROWTH TO DATE CULTURE WILL BE HELD FOR 5 DAYS BEFORE ISSUING A FINAL NEGATIVE REPORT     Performed at [facility]   Report Status PENDING   Incomplete  CULTURE, BLOOD (ROUTINE X 2)     Status: None   Collection Time    01/25/13 11:06 PM      Result Value Range Status   Specimen Description BLOOD LEFT ARM   Final   Special Requests BOTTLES DRAWN AEROBIC ONLY 4CC   Final   Culture  Setup Time     Final   Value: 01/26/2013 09:03     Performed at [facility]   Culture     Final   Value:        BLOOD CULTURE RECEIVED NO GROWTH TO DATE  CULTURE WILL BE HELD FOR 5 DAYS BEFORE ISSUING A FINAL NEGATIVE REPORT     Performed at [facility]   Report Status PENDING   Incomplete     Studies: No results found.  Scheduled Meds: . amLODipine  10 mg Oral Daily  . aspirin EC  325 mg Oral Daily  . atorvastatin  80 mg Oral QHS  . calcitRIOL  0.25 mcg Oral Daily  . docusate sodium  100 mg Oral BID  . feeding supplement (RESOURCE BREEZE)  1 Container Oral Daily  . heparin  5,000 Units Subcutaneous Q8H  . insulin aspart  0-15 Units Subcutaneous TID WC  . insulin aspart  0-5 Units Subcutaneous QHS  . insulin aspart  7 Units Subcutaneous TID WC  . insulin glargine  45 Units Subcutaneous BID  . lactulose  30 g Oral TID  . mupirocin cream   Topical BID  . polyethylene glycol  17 g Oral Daily  . primidone  100 mg Oral  Daily  . senna  2 tablet Oral QHS  . sevelamer carbonate  800 mg Oral TID WC  . sodium chloride  3 mL Intravenous Q12H  . traMADol  100 mg Oral BID   Continuous Infusions: . sodium chloride    . sodium chloride 75 mL/hr at 01/30/13 1347    Principal Problem:   Altered mental state Active Problems:   Acute on chronic kidney failure   Transaminitis   Leukocytosis   Anemia of chronic disease   Hyponatremia   Hyperglycemia   Acute on chronic diastolic heart failure   Humerus head fracture    Time spent: 30 min    Tulsi Crossett, [facility]  [facility] Pager [phone]. If 7PM-7AM, please contact night-coverage at [email or web address], password [facility] 01/30/2013, 8:48 PM  LOS: 6 days

## 2013-01-31 LAB — RENAL FUNCTION PANEL
ALBUMIN: 2.3 g/dL — AB (ref 3.5–5.2)
BUN: 117 mg/dL — ABNORMAL HIGH (ref 6–23)
CALCIUM: 9 mg/dL (ref 8.4–10.5)
CO2: 23 mEq/L (ref 19–32)
CREATININE: 2.17 mg/dL — AB (ref 0.50–1.35)
Chloride: 98 mEq/L (ref 96–112)
GFR calc non Af Amer: 29 mL/min — ABNORMAL LOW (ref >90)
GFR, EST AFRICAN AMERICAN: 34 mL/min — AB (ref >90)
Glucose, Bld: 133 mg/dL — ABNORMAL HIGH (ref 70–99)
PHOSPHORUS: 4.5 mg/dL (ref 2.3–4.6)
Potassium: 4.3 mEq/L (ref 3.7–5.3)
Sodium: 136 mEq/L — ABNORMAL LOW (ref 137–147)

## 2013-01-31 LAB — GLUCOSE, CAPILLARY
GLUCOSE-CAPILLARY: 82 mg/dL (ref 70–99)
Glucose-Capillary: 120 mg/dL — ABNORMAL HIGH (ref 70–99)
Glucose-Capillary: 164 mg/dL — ABNORMAL HIGH (ref 70–99)
Glucose-Capillary: 90 mg/dL (ref 70–99)

## 2013-01-31 MED ORDER — FUROSEMIDE 80 MG PO TABS
80.0000 mg | ORAL_TABLET | Freq: Every day | ORAL | Status: DC
  Administered 2013-02-01 (×2): 80 mg via ORAL
  Filled 2013-01-31 (×2): qty 1

## 2013-01-31 MED ORDER — PROMETHAZINE HCL 25 MG/ML IJ SOLN
12.5000 mg | Freq: Four times a day (QID) | INTRAMUSCULAR | Status: DC | PRN
  Administered 2013-01-31: 12.5 mg via INTRAVENOUS
  Filled 2013-01-31: qty 1

## 2013-01-31 NOTE — Progress Notes (Signed)
01/31/13 1611  OT Visit Information  Last OT Received On 01/31/13  Assistance Needed +2  History of Present Illness pt was admitted with AMS.  He has a R humerus fx from fall, which is immobilized in a sling.  He has a h/o CVA, DM, HTN, MI  OT Time Calculation  OT Start Time 1510  OT Stop Time 1542  OT Time Calculation (min) 32 min  Precautions  Precautions Fall  Required Braces or Orthoses Sling  Restrictions  RUE Weight Bearing NWB  ADL  Toilet Transfer Performed;Moderate assistance  Toilet Transfer Method Stand pivot  Toilet Transfer Equipment Bedside commode  Transfers/Ambulation Related to ADLs SPT to 3:1 commode  ADL Comments earlier this am, I placed sling back on pt when he was in bed.  When therapy got him out of bed, adjusted for better support.  Pt is unable to tolerate arm at 90 degree angle. at approximately 120 flexion.  Added pad for neck to decrease pressure, trimmed down waist extension strap and made an extension for thumb loop.  Educated pt/NT to remove if it hurts and to check skin.  Also added sign to white board to keep small pillow positioned under shoulder to elbow when in bed for more support.  Educated pt on directing others to wash under arm with seesaw movement as sling is on at all times (non-surgical management).    Cognition  Arousal/Alertness Awake/alert  Behavior During Therapy WFL for tasks assessed/performed  Overall Cognitive Status Within Functional Limits for tasks assessed  Transfers  Sit to Stand 3: Mod assist  Details for Transfer Assistance cues for hand placement and LE placement  OT - End of Session  Activity Tolerance Patient tolerated treatment well  Patient left (on commode with call light; RN/NT aware; pt will call)  Nurse Communication Mobility status (pt on commode)  OT Assessment/Plan  OT Frequency Min 2X/week  Follow Up Recommendations SNF  OT Equipment 3 in 1 bedside comode  OT Goal Progression  Progress towards OT goals  Progressing toward goals  OT Treatments  $Self Care/Home Management  8-22 mins  $Therapeutic Activity 8-22 mins  [place], OTR/L [phone] 01/31/2013

## 2013-01-31 NOTE — Progress Notes (Signed)
Physical Therapy Treatment Patient Details Name: Mitchell Garcia MRN: [id number] DOB: May 30, 1945 Today's Date: 01/31/2013 Time: 4098-1191 PT Time Calculation (min): 36 min  PT Assessment / Plan / Recommendation  History of Present Illness  68 yo male admitted with AMS, fall, R shoulder fx.    PT Comments   Progressing with mobility. Able to take a few steps in room today.   Follow Up Recommendations  SNF     Does the patient have the potential to tolerate intense rehabilitation     Barriers to Discharge        Equipment Recommendations   (to be determined)    Recommendations for Other Services OT consult  Frequency Min 3X/week   Progress towards PT Goals Progress towards PT goals: Progressing toward goals (slowly)  Plan Current plan remains appropriate    Precautions / Restrictions Precautions Precautions: Fall Required Braces or Orthoses: Sling (R UE) Restrictions Weight Bearing Restrictions: Yes RUE Weight Bearing: Non weight bearing   Pertinent Vitals/Pain R UE 5/10    Mobility  Bed Mobility Overal bed mobility: Needs Assistance Bed Mobility: Supine to Sit Supine to sit: Mod assist;+2 for safety/equipment;+2 for physical assistance General bed mobility comments: Assist for trunk to upright. Increased time. Utilized bedpad for scooting, positioning. Multimodal cues for safety, technique, hand placement.  Transfers Overall transfer level: Needs assistance Transfers: Sit to/from Stand Sit to Stand: Mod assist;+2 physical assistance;+2 safety/equipment General transfer comment: x 2. Assist to rise, stabilize, control descent. Multimodal cues for safety, technique, hand placement Ambulation/Gait Ambulation/Gait assistance: Mod assist;+2 physical assistance;+2 safety/equipment Assistive device: Quad cane;Hemi-walker Gait Pattern/deviations: Step-to pattern;Step-through pattern;Decreased stride length;Wide base of support General Gait Details: Assist to stabilize and  maneuver with devices. Used hemi-walker with 1st walk ~5 feet. Progressed to quad cane with 2nd walk ~10 feet. Still may be able to progress to ambulating with [facility] next visit. Seated rest break between walks.     Exercises     PT Diagnosis:    PT Problem List:   PT Treatment Interventions:     PT Goals (current goals can now be found in the care plan section)    Visit Information  Last PT Received On: 01/31/13 Assistance Needed: +2 PT/OT/SLP Co-Evaluation/Treatment: Yes Reason for Co-Treatment: For patient/therapist safety PT goals addressed during session: Mobility/safety with mobility;Balance;Proper use of DME    Subjective Data      Cognition  Cognition Arousal/Alertness: Awake/alert Behavior During Therapy: WFL for tasks assessed/performed Overall Cognitive Status: Within Functional Limits for tasks assessed    Balance  Balance Overall balance assessment: History of Falls;Needs assistance Sitting-balance support: Single extremity supported;Feet supported Sitting balance-Leahy Scale: Good Standing balance support: Single extremity supported;During functional activity Standing balance-Leahy Scale: Poor  End of Session PT - End of Session Equipment Utilized During Treatment: Gait belt Activity Tolerance: Patient tolerated treatment well Patient left: in chair;with call bell/phone within reach;with chair alarm set (with OT in room)   GP     Weston Anna, MPT Pager: [phone]

## 2013-01-31 NOTE — Progress Notes (Signed)
[name], male   DOB: 1945-09-10, 68 y.o.   MRN: [id number]  [facility] PROGRESS NOTE  Mitchell Garcia [id number] DOB: Dec 06, 1945 DOA: 01/24/2013 PCP: No primary provider on file.  Brief narrative: 68 yo male with diabetes, CKD stage IV, diastolic heart failure, recent right humeral head fracture, who presented to [facility] ED with altered mental status. He was found to have acute diastolic heart failure and has required diureses. Nephrology team was consulted.   Assessment/Plan  Altered mental status - initially due to lingering effects of narcotics and gabapentin - pt more alert this am and follows commands appropriately  - UDS positive for barbiturates due to primidone  - EtOH neg  - Continue ultram as needed for pain  - BC x 2 NGTD  Acute on chronic kidney disease stage IV - was on IVF over the past 48 hours and Cr trending down - d/c IVF now and resume home dose of Lasix, continue to hold ACEI due to hyperkalemia  - obtain BMP in AM Hyperkalemia today  - Added ACEI/ARB to intolerance list  - K is WNL this AM Acute hypoxic respiratory failure - secondary to acute on chronic diastolic heart failure, stage II (per 2 D ECHO) - clinically compensated at this time, weight this AM 258 lbs - place on home dose Lasix and monitor response  HTN - previously on norvasc, HCTZ, lisinopril - HCTZ not effective given CrCl and lisinopril contraindicated due to hyperkalemia - reasonable inpatient control on Norvasc  Diabetes mellitus, uncontrolled with renal manifestations  - A1c 8.9  - Increased to lantus 45 units bid with addition on insulin with meal coverage Leukocytosis  - without fever. UA neg, CXR without acute disease.  - May be mildly elevated from recent humerus fracture.  - Trend WBC  Normocytic anemia - likely secondary to renal parenchymal disease  - iron studies consistent with chronic disease  - B12 < 400 so will start low dose B12  - Folate wnl   Hyponatremia - may be due to fluid retention, stable.  Transaminitis - may be due to vascular congestion/hypervolemia or to NASH - No history of EtOH abuse per family.  Generalized weakness  - PT/OT consults rec SNF  - d/c once bed available  Right humerus fracture - Appreciate Orthopedics assistance  - Plan for follow up in 4-5 weeks. Family to call for appointment   Diet: renal  Proph: heparin   Code Status: full  Family Communication: patient, his wife and daughter  Disposition Plan: To SNF when bed available   Consultants:  Nephrology, Dr. Jonnie Finner  Orthopedics, Dr. Veverly Fells Procedures:  RUS  VQ  CXR  CT head Antibiotics:  None   HPI/Subjective: No events overnight.   Objective: Filed Vitals:   01/30/13 2017 01/31/13 0520 01/31/13 1017 01/31/13 1432  BP: 150/62 149/56 145/56 142/63  Pulse: 60 60  52  Temp: 98.4 F (36.9 C) 98.5 F (36.9 C)  98 F (36.7 C)  TempSrc: Oral Oral  Oral  Resp: 18 18  20   Height:      Weight:  117.2 kg (258 lb 6.1 oz)    SpO2: 97% 95%  96%    Intake/Output Summary (Last 24 hours) at 01/31/13 1836 Last data filed at 01/31/13 1737  Gross per 24 hour  Intake   1365 ml  Output   3125 ml  Net  -1760 ml    Exam:   General:  Pt is alert, follows commands appropriately,  not in acute distress  Cardiovascular: Regular rate and rhythm, S1/S2, no murmurs, no rubs, no gallops  Respiratory: Clear to auscultation bilaterally, no wheezing, no crackles, no rhonchi  Abdomen: Soft, non tender, non distended, bowel sounds present, no guarding  Neuro: Grossly nonfocal  Data Reviewed: Basic Metabolic Panel:  Recent Labs Lab 01/24/13 2350 01/25/13 0615 01/27/13 0540 01/28/13 0524 01/29/13 0532 01/30/13 0530 01/31/13 0400  NA 127* 134*  131* 131* 133* 132* 132* 136*  K 5.2 5.2  5.1 4.8 4.8 4.8 5.7* 4.3  CL 91* 97  96 93* 94* 93* 95* 98  CO2 20 21  20 21 21 20 20 23   GLUCOSE 157* 114*  115* 158* 234* 184* 167* 133*  BUN  84* 83*  85* 100* 113* 132* 134* 117*  CREATININE 4.31* 3.9*  3.96* 3.04* 2.91* 2.92* 2.57* 2.17*  CALCIUM 8.6 8.6  8.4 8.9 9.1 9.3 9.4 9.0  MG  --  2.4  --   --   --   --   --   PHOS  --  6.7*  --   --   --  6.2* 4.5  < > = values in this interval not displayed. Liver Function Tests:  Recent Labs Lab 01/24/13 2350 01/25/13 0615 01/30/13 0530 01/31/13 0400  AST 66* 84*  84*  --   --   ALT 59* 81*  81*  --   --   ALKPHOS 145* 170*  176*  --   --   BILITOT 0.9 1.2  1.2  --   --   PROT 6.5 6.2  6.3  --   --   ALBUMIN 2.9* 2.8*  2.8* 2.5* 2.3*   CBC:  Recent Labs Lab 01/24/13 2350 01/25/13 0615 01/26/13 0506 01/27/13 0540 01/29/13 0532  WBC 12.3* 12.7* 11.0* 12.7* 12.2*  HGB 10.4* 9.4* 9.2* 9.4* 9.8*  HCT 31.9* 28.5* 27.2* 28.0* 29.2*  MCV 88.6 88.8 87.2 87.5 86.9  PLT 272 277 326 348 386   Cardiac Enzymes:  Recent Labs Lab 01/25/13 0615 01/25/13 1247 01/25/13 1815  TROPONINI <0.30 <0.30 <0.30   CBG:  Recent Labs Lab 01/30/13 1708 01/30/13 2255 01/31/13 0739 01/31/13 1205 01/31/13 1707  GLUCAP 172* 200* 120* 164* 90    Recent Results (from the past 240 hour(s))  URINE CULTURE     Status: None   Collection Time    01/25/13  5:38 AM      Result Value Range Status   Specimen Description URINE, RANDOM   Final   Special Requests NONE   Final   Culture  Setup Time     Final   Value: 01/25/2013 12:04     Performed at [facility]     Final   Value: 3,000 COLONIES/ML     Performed at [facility]   Culture     Final   Value: INSIGNIFICANT GROWTH     Performed at [facility]   Report Status 01/26/2013 FINAL   Final  CULTURE, BLOOD (ROUTINE X 2)     Status: None   Collection Time    01/25/13 11:02 PM      Result Value Range Status   Specimen Description BLOOD LEFT HAND   Final   Special Requests BOTTLES DRAWN AEROBIC AND ANAEROBIC 10CC   Final   Culture  Setup Time     Final   Value: 01/26/2013 09:03      Performed at [facility]  Culture     Final   Value:        BLOOD CULTURE RECEIVED NO GROWTH TO DATE CULTURE WILL BE HELD FOR 5 DAYS BEFORE ISSUING A FINAL NEGATIVE REPORT     Performed at [facility]   Report Status PENDING   Incomplete  CULTURE, BLOOD (ROUTINE X 2)     Status: None   Collection Time    01/25/13 11:06 PM      Result Value Range Status   Specimen Description BLOOD LEFT ARM   Final   Special Requests BOTTLES DRAWN AEROBIC ONLY 4CC   Final   Culture  Setup Time     Final   Value: 01/26/2013 09:03     Performed at [facility]   Culture     Final   Value:        BLOOD CULTURE RECEIVED NO GROWTH TO DATE CULTURE WILL BE HELD FOR 5 DAYS BEFORE ISSUING A FINAL NEGATIVE REPORT     Performed at [facility]   Report Status PENDING   Incomplete     Scheduled Meds: . amLODipine  10 mg Oral Daily  . aspirin EC  325 mg Oral Daily  . atorvastatin  80 mg Oral QHS  . calcitRIOL  0.25 mcg Oral Daily  . docusate sodium  100 mg Oral BID  . feeding supplement (RESOURCE BREEZE)  1 Container Oral Daily  . furosemide  80 mg Oral Daily  . heparin  5,000 Units Subcutaneous Q8H  . insulin aspart  0-15 Units Subcutaneous TID WC  . insulin aspart  0-5 Units Subcutaneous QHS  . insulin aspart  7 Units Subcutaneous TID WC  . insulin glargine  45 Units Subcutaneous BID  . mupirocin cream   Topical BID  . polyethylene glycol  17 g Oral Daily  . primidone  100 mg Oral Daily  . senna  2 tablet Oral QHS  . sevelamer carbonate  800 mg Oral TID WC  . sodium chloride  3 mL Intravenous Q12H  . traMADol  100 mg Oral BID   Continuous Infusions:    Faye Ramsay, MD  [facility] Pager [phone]  If 7PM-7AM, please contact night-coverage [email or web address] Password [facility] 01/31/2013, 6:36 PM   LOS: 7 days

## 2013-01-31 NOTE — Progress Notes (Signed)
Pt very sleepy after IV Phenergan. Arousable. Will cont to monitor.

## 2013-01-31 NOTE — Progress Notes (Signed)
[facility] Progress Note   Subjective:  Looks even more alert today Ate better Still getting IVF at 87 (were to have stopped at 7AM)   Filed Vitals:   01/30/13 2017 01/31/13 0520 01/31/13 1017 01/31/13 1432  BP: 150/62 149/56 145/56 142/63  Pulse: 60 60  52  Temp: 98.4 F (36.9 C) 98.5 F (36.9 C)  98 F (36.7 C)  TempSrc: Oral Oral  Oral  Resp: 18 18  20   Height:      Weight:  117.2 kg (258 lb 6.1 oz)    SpO2: 97% 95%  96%   Weight trending: 01/31/13 0520 117.2 kg  01/30/13 0625 115.3 kg (254 lb 3.1 oz) 01/29/13 0600 117.8 kg (259 lb 11.2 oz) 01/28/13 0512 118 kg (260 lb 2.3 oz) 01/27/13 0622 120.1 kg (264 lb 12.4 oz) 01/26/13 0626 123.4 kg (272 lb 0.8 oz)  01/25/13 0450 124.8 kg (275 lb 2.2 oz) I/O last 3 completed shifts: In: 2625 [P.O.:600; I.V.:2025] Out: 3725 [[id number]] Total I/O In: 600 [P.O.:600] Out: 1100 [Urine:1100]  Exam Large framed older WM Alert, no distress. More animated than I have seen on previous days No JVD Clear bilat RRR no rub or M Large amt of ecchymotic change right shoulder/upper arm; in sling Abd soft, nt/nd Condom cath in place  Clear urine in bag No LE edema No asterixis   Additional objective data:  Recent Labs Lab 01/25/13 0615  01/29/13 0532 01/30/13 0530 01/30/13 1444 01/31/13 0400  NA 134*  131*  < > 132* 132*  --  136*  K 5.2  5.1  < > 4.8 5.7* 4.7 4.3  CL 97  96  < > 93* 95*  --  98  CO2 21  20  < > 20 20  --  23  GLUCOSE 114*  115*  < > 184* 167*  --  133*  BUN 83*  85*  < > 132* 134*  --  117*  CREATININE 3.90*  3.96*  < > 2.92* 2.57*  --  2.17*  CALCIUM 8.6  8.4  < > 9.3 9.4  --  9.0  PHOS 6.7*  --   --  6.2*  --  4.5  < > = values in this interval not displayed.  Recent Labs Lab 01/24/13 2350 01/25/13 0615 01/30/13 0530 01/31/13 0400  AST 66* 84*  84*  --   --   ALT 59* 81*  81*  --   --   ALKPHOS 145* 170*  176*  --   --   BILITOT 0.9 1.2  1.2  --   --   PROT 6.5  6.2  6.3  --   --   ALBUMIN 2.9* 2.8*  2.8* 2.5* 2.3*    Results for Mitchell Garcia, Mitchell Garcia (MRN [id number]) as of 01/29/2013 17:29  Ref. Range 01/26/2013 05:06  PTH Latest Range: 14.0-72.0 pg/mL 219.8 (H)    Recent Labs Lab 01/26/13 0506 01/27/13 0540 01/29/13 0532  WBC 11.0* 12.7* 12.2*  HGB 9.2* 9.4* 9.8*  HCT 27.2* 28.0* 29.2*  MCV 87.2 87.5 86.9  PLT 326 348 386   Current Medications: . amLODipine  10 mg Oral Daily  . aspirin EC  325 mg Oral Daily  . atorvastatin  80 mg Oral QHS  . calcitRIOL  0.25 mcg Oral Daily  . docusate sodium  100 mg Oral BID  . feeding supplement (RESOURCE BREEZE)  1 Container Oral Daily  . heparin  5,000 Units Subcutaneous Q8H  .  insulin aspart  0-15 Units Subcutaneous TID WC  . insulin aspart  0-5 Units Subcutaneous QHS  . insulin aspart  7 Units Subcutaneous TID WC  . insulin glargine  45 Units Subcutaneous BID  . mupirocin cream   Topical BID  . polyethylene glycol  17 g Oral Daily  . primidone  100 mg Oral Daily  . senna  2 tablet Oral QHS  . sevelamer carbonate  800 mg Oral TID WC  . sodium chloride  3 mL Intravenous Q12H  . traMADol  100 mg Oral BID   . sodium chloride 20 mL/hr (01/31/13 1611)   acetaminophen, ondansetron (ZOFRAN) IV, ondansetron   Assessment/Plan:  1.  CKD: s/p AKI on CKD in the setting of heart failure, aggressive diuresis, ACEi.  Creatinine has improved from 4.3 to 2.19 today and has shown continued improvement over the past 3 days,  BUN also dropping.  Stage 3 by current GFR estimates.  Lasix has been on hold X 3 days. IVF at 95 should have been stopped at 7AM but are still running.  Will D/C.  Then restart lasix at a dose of 80 mg/day tomorrow. (Was on HCTZ on admission - needs a stronger diuretic with current GFR of around 35))  Potassium fine today.  Tendency toward hyperkalemia this admission would in my mind be contraindication to any future use of ACE or ARB) . Not much else for me to add at this time.  Family  expressed interest to Dr. Jonnie Finner in having pt f/u with [facility] for renal issues - but is [facility] and I am unsure if [facility] has to authorize non-VA outpt subspecialty F/U since they provide Nephrology services in [place] and he has been seen there. 2.  Anemia CKD: Hb 9.8  no Rx at this time 3.  MBD/CKD: , started renvela as binder with meals; started calcitriol 0.25 mcg/day for PTH of 219. Continue both. 4.  DM with neuropathy 5.  Hx CVA 6.  Diastolic dysfunction by echo, grade 2 - no clinical CHF at this time 7.  Right proximal humerus fracture d/t fall - plan is for "closed management"; narcotics being used sparingly with MS issues 8.  Decreased health literacy - poor illness understanding; and says "not much education"  May cloud in some ways assessment of mental status but he seems quite clear to me today.    My recommendations and thoughts are outlined above.  Will sign off. Please call if you have additional management questions.  Lucrezia Starch, MD

## 2013-01-31 NOTE — Progress Notes (Signed)
Occupational Therapy Treatment Patient Details Name: Mitchell Garcia MRN: [id number] DOB: 1945/11/08 Today's Date: 01/31/2013 Time: 0175-1025 OT Time Calculation (min): 29 min  OT Assessment / Plan / Recommendation  History of present illness pt was admitted with AMS.  He has a R humerus fx from fall, which is immobilized in a sling.  He has a h/o CVA, DM, HTN, MI   OT comments  Pt is making good progress with OT  Follow Up Recommendations  SNF    Barriers to Discharge       Equipment Recommendations  3 in 1 bedside comode    Recommendations for Other Services    Frequency Min 2X/week   Progress towards OT Goals Progress towards OT goals: Progressing toward goals  Plan      Precautions / Restrictions Precautions Precautions: Fall Required Braces or Orthoses: Sling Restrictions Weight Bearing Restrictions: Yes RUE Weight Bearing: Non weight bearing   Pertinent Vitals/Pain Pain in R shoulder, not rated    ADL  Toilet Transfer: Simulated;+2 Total assistance Toilet Transfer: Patient Percentage: 70% Toilet Transfer Method: Sit to stand (ambulated to chair about 10 feet) Transfers/Ambulation Related to ADLs: Pt was able to maintain static standing for 2 minutes with min A for LB adls.  He needs mod A for bed mobility    OT Diagnosis:    OT Problem List:   OT Treatment Interventions:     OT Goals(current goals can now be found in the care plan section) Acute Rehab OT Goals OT Goal Formulation: With patient Time For Goal Achievement: 02/09/13 Potential to Achieve Goals: Good  Visit Information  Last OT Received On: 01/31/13 Assistance Needed: +2 PT/OT/SLP Co-Evaluation/Treatment: Yes Reason for Co-Treatment: For patient/therapist safety PT goals addressed during session: Mobility/safety with mobility;Balance;Proper use of DME OT goals addressed during session: ADL's and self-care History of Present Illness: pt was admitted with AMS.  He has a R humerus fx from fall,  which is immobilized in a sling.  He has a h/o CVA, DM, HTN, MI    Subjective Data      Prior Functioning       Cognition  Cognition Arousal/Alertness: Awake/alert Behavior During Therapy: WFL for tasks assessed/performed Overall Cognitive Status: Within Functional Limits for tasks assessed    Mobility  Bed Mobility Overal bed mobility: Needs Assistance Bed Mobility: Supine to Sit Supine to sit: Mod assist;+2 for safety/equipment;+2 for physical assistance General bed mobility comments: Assist for trunk to upright. Increased time. Utilized bedpad for scooting, positioning. Multimodal cues for safety, technique, hand placement.  Transfers Overall transfer level: Needs assistance Transfers: Sit to/from Stand Sit to Stand: Mod assist;+2 physical assistance;+2 safety/equipment General transfer comment: x 2. Assist to rise, stabilize, control descent. Multimodal cues for safety, technique, hand placement    Exercises      Balance Balance Overall balance assessment: History of Falls;Needs assistance Sitting-balance support: Single extremity supported;Feet supported Sitting balance-Leahy Scale: Good Standing balance support: Single extremity supported;During functional activity Standing balance-Leahy Scale: Poor  End of Session OT - End of Session Activity Tolerance: Patient tolerated treatment well Patient left: in chair;with call bell/phone within reach;with chair alarm set  [place] 01/31/2013, 4:08 PM Lesle Chris, OTR/L [phone] 01/31/2013

## 2013-01-31 NOTE — Progress Notes (Signed)
PT Cancellation Note  Patient Details Name: Mitchell Garcia MRN: [id number] DOB: 1946-01-26   Cancelled Treatment:    Reason Eval/Treat Not Completed: attempted PT tx-family/pt declined participation at this time. Requested PT check back later. Will check back as schedule permits.    Weston Anna, MPT Pager: [phone]

## 2013-01-31 NOTE — Progress Notes (Signed)
OT Cancellation Note  Patient Details Name: MICKAEL MCNUTT MRN: [id number] DOB: [birth year]   Cancelled Treatment:    Reason Eval/Treat Not Completed: Other (comment)  Pt did not sleep well/  When OT arrived, sling was not on elbow completely.  Placed back on elbow and pt needs further adjustment, however pain was too great.  Also could not tolerate slight roll to place small pillow under arm--which he needs.  Will try to check back later today.    Dawnell Bryant 01/31/2013, 11:07 AM Lesle Chris, OTR/L [phone] 01/31/2013

## 2013-02-01 LAB — RENAL FUNCTION PANEL
Albumin: 2.4 g/dL — ABNORMAL LOW (ref 3.5–5.2)
BUN: 96 mg/dL — AB (ref 6–23)
CALCIUM: 9.4 mg/dL (ref 8.4–10.5)
CHLORIDE: 101 meq/L (ref 96–112)
CO2: 23 meq/L (ref 19–32)
CREATININE: 1.96 mg/dL — AB (ref 0.50–1.35)
GFR calc Af Amer: 38 mL/min — ABNORMAL LOW (ref >90)
GFR calc non Af Amer: 33 mL/min — ABNORMAL LOW (ref >90)
GLUCOSE: 107 mg/dL — AB (ref 70–99)
Phosphorus: 3.9 mg/dL (ref 2.3–4.6)
Potassium: 4.4 mEq/L (ref 3.7–5.3)
Sodium: 139 mEq/L (ref 137–147)

## 2013-02-01 LAB — CULTURE, BLOOD (ROUTINE X 2)
Culture: NO GROWTH
Culture: NO GROWTH

## 2013-02-01 LAB — GLUCOSE, CAPILLARY
GLUCOSE-CAPILLARY: 123 mg/dL — AB (ref 70–99)
GLUCOSE-CAPILLARY: 73 mg/dL (ref 70–99)
GLUCOSE-CAPILLARY: 170 mg/dL — AB (ref 70–99)
GLUCOSE-CAPILLARY: 113 mg/dL — AB (ref 70–99)

## 2013-02-01 MED ORDER — TRAMADOL HCL 50 MG PO TABS: 100.0000 mg | ORAL_TABLET | Freq: Two times a day (BID) | ORAL | Status: DC

## 2013-02-01 MED ORDER — SEVELAMER CARBONATE 800 MG PO TABS: 800.0000 mg | ORAL_TABLET | Freq: Three times a day (TID) | ORAL | Status: DC

## 2013-02-01 MED ORDER — OXYCODONE-ACETAMINOPHEN 5-325 MG PO TABS: 1.0000 | ORAL_TABLET | Freq: Four times a day (QID) | ORAL | Status: DC | PRN

## 2013-02-01 MED ORDER — DSS 100 MG PO CAPS: 100.0000 mg | ORAL_CAPSULE | Freq: Two times a day (BID) | ORAL | Status: AC

## 2013-02-01 MED ORDER — FUROSEMIDE 80 MG PO TABS: 80.0000 mg | ORAL_TABLET | Freq: Every day | ORAL | Status: DC

## 2013-02-01 MED ORDER — ALPRAZOLAM 0.5 MG PO TABS: 0.5000 mg | ORAL_TABLET | Freq: Every evening | ORAL | Status: DC | PRN

## 2013-02-01 MED ORDER — CALCITRIOL 0.25 MCG PO CAPS: 0.2500 ug | ORAL_CAPSULE | Freq: Every day | ORAL | Status: DC

## 2013-02-01 NOTE — Progress Notes (Signed)
Patient is set to discharge to [facility] today. Patient, wife & granddaughter aware. Discharge packet given to RN, Marissa. [facility] scheduled for transport pickup (Service Request Id: [phone]).   Clinical Social Work Department CLINICAL SOCIAL WORK PLACEMENT NOTE 02/01/2013  Patient:  GLEN, KESINGER  Account Number:  [id number] Admit date:  01/24/2013  Clinical Social Worker:  Renold Genta  Date/time:  01/26/2013 03:46 PM  Clinical Social Work is seeking post-discharge placement for this patient at the following level of care:   SKILLED NURSING   (*CSW will update this form in Epic as items are completed)   01/26/2013  Patient/family provided with [place] Department of Clinical Social Work's list of facilities offering this level of care within the geographic area requested by the patient (or if unable, by the patient's family).  01/26/2013  Patient/family informed of their freedom to choose among providers that offer the needed level of care, that participate in Medicare, Medicaid or managed care program needed by the patient, have an available bed and are willing to accept the patient.  01/26/2013  Patient/family informed of [facility]' ownership interest in [facility], as well as of the fact that they are under no obligation to receive care at this facility.  PASARR submitted to EDS on 01/26/2013 PASARR number received from EDS on 01/26/2013  FL2 transmitted to all facilities in geographic area requested by pt/family on  01/26/2013 FL2 transmitted to all facilities within larger geographic area on   Patient informed that his/her managed care company has contracts with or will negotiate with  certain facilities, including the following:     Patient/family informed of bed offers received:  01/30/2013 Patient chooses bed at [place], [facility] Physician recommends and patient chooses bed at    Patient to be transferred to [place],  [facility] on  02/01/2013 Patient to be transferred to facility by [facility]  The following physician request were entered in Epic:   Additional Comments:   Winfred Leeds, [place] Worker cell #: [phone]

## 2013-02-01 NOTE — Discharge Summary (Signed)
Physician Discharge Summary  Mitchell Garcia [id number] DOB: Nov 11, 1945 DOA: 01/24/2013  PCP: No primary provider on file.  Admit date: 01/24/2013 Discharge date: 02/01/2013  Recommendations for Outpatient Follow-up:  1. Pt will need to follow up with PCP in 2-3 weeks post discharge 2. Please obtain BMP to evaluate electrolytes and kidney function, check potassium level and supplement if indicated 3. Please also check CBC to evaluate Hg and Hct levels 4. Please note that patient was advised to call [facility], patient was to followup with Dr. Jonnie Finner, appointment will be scheduled for the patient 5. Please note the following changes were made to medical regimen: Lisinopril and HCTZ were discontinued due to hyperkalemia and low GFR respectively 6. Patient was also started on Lasix 80 mg tablet by mouth daily, awake upon discharge 257 pounds 7. Please note that patient was not discharge on potassium supplements but this may be needed since patient is now on Lasix 80 mg tablet by mouth daily  Discharge Diagnoses: Altered mental status secondary to narcotic effect, acute on chronic renal failure  Principal Problem:   Altered mental state Active Problems:   Acute on chronic kidney failure   Transaminitis   Leukocytosis   Anemia of chronic disease   Hyponatremia   Hyperglycemia   Acute on chronic diastolic heart failure   Humerus head fracture   Discharge Condition: Stable  Diet recommendation: Heart healthy diet discussed in details   Brief narrative:  68 yo male with diabetes, CKD stage IV, diastolic heart failure, recent right humeral head fracture, who presented to [facility] ED with altered mental status. He was found to have acute diastolic heart failure and has required diureses. Nephrology team was consulted.   Assessment/Plan  Altered mental status  - initially due to lingering effects of narcotics and gabapentin  - pt more alert this am and follows commands  appropriately  - UDS positive for barbiturates due to primidone  - EtOH neg  - Continue ultram as needed for pain  - BC x 2 NGTD  Acute on chronic kidney disease stage IV  - was on IVF and has responded well, IV fluids discontinued 01/31/2013 - Patient was started on Lasix 80 mg tablet by mouth daily, creatinine continues trending down Hyperkalemia today  - Added ACEI/ARB to intolerance list  - Please note that lisinopril and HCTZ were both discontinued during this hospital stay - Potassium is within normal limits this morning Acute hypoxic respiratory failure  - secondary to acute on chronic diastolic heart failure, stage II (per 2 D ECHO)  - clinically compensated at this time, weight this AM 258 lbs  - place on home dose Lasix and monitor response  HTN  - previously on norvasc, HCTZ, lisinopril  - HCTZ not effective given CrCl and lisinopril contraindicated due to hyperkalemia  - reasonable inpatient control on Norvasc  Diabetes mellitus, uncontrolled with renal manifestations  - A1c 8.9  - Increased to lantus 45 units bid with addition on insulin with meal coverage  Leukocytosis  - without fever. UA neg, CXR without acute disease.  - May be mildly elevated from recent humerus fracture.  Normocytic anemia  - likely secondary to renal parenchymal disease  - iron studies consistent with chronic disease  - B12 < 400 so will start low dose B12  - Folate wnl  Hyponatremia  - may be due to fluid retention, stable.  Transaminitis  - may be due to vascular congestion/hypervolemia or to NASH  - No history  of EtOH abuse per family.  Generalized weakness  - PT/OT consults rec SNF  - d/c once bed available  Right humerus fracture  - Appreciate Orthopedics assistance  - Plan for follow up in 4-5 weeks. Family to call for appointment   Code Status: full  Family Communication: patient, his wife and daughter at bedside Disposition Plan: To SNF when bed available, possibly discharge  later this evening   Consultants:  Nephrology, Dr. Jonnie Finner  Orthopedics, Dr. Veverly Fells Procedures:  RUS  VQ  CXR  CT head Antibiotics:  None   Discharge Exam: Filed Vitals:   02/01/13 0537  BP: 141/48  Pulse: 63  Temp: 98.2 F (36.8 C)  Resp: 18   Filed Vitals:   01/31/13 1432 01/31/13 2155 01/31/13 2234 02/01/13 0537  BP: 142/63 133/51  141/48  Pulse: 52 45 56 63  Temp: 98 F (36.7 C) 98.2 F (36.8 C)  98.2 F (36.8 C)  TempSrc: Oral Axillary  Oral  Resp: 20 18  18   Height:      Weight:    116.8 kg (257 lb 8 oz)  SpO2: 96% 94%  95%    General: Pt is alert, follows commands appropriately, not in acute distress Cardiovascular: Regular rate and rhythm, S1/S2 +, no murmurs, no rubs, no gallops Respiratory: Clear to auscultation bilaterally, no wheezing, no crackles, no rhonchi Abdominal: Soft, non tender, non distended, bowel sounds +, no guarding Extremities: no edema, no cyanosis, pulses palpable bilaterally DP and PT Neuro: Grossly nonfocal  Discharge Instructions  Discharge Orders   Future Orders Complete By Expires   Diet - low sodium heart healthy  As directed    Increase activity slowly  As directed        Medication List    STOP taking these medications       hydrochlorothiazide 25 MG tablet  Commonly known as:  HYDRODIURIL     lisinopril 40 MG tablet  Commonly known as:  PRINIVIL,ZESTRIL      TAKE these medications       ALPRAZolam 0.5 MG tablet  Commonly known as:  XANAX  Take 1 tablet (0.5 mg total) by mouth at bedtime as needed for anxiety.     amLODipine 10 MG tablet  Commonly known as:  NORVASC  Take 10 mg by mouth daily.     aspirin EC 81 MG tablet  Take 81 mg by mouth daily.     atorvastatin 80 MG tablet  Commonly known as:  LIPITOR  Take 80 mg by mouth at bedtime.     calcitRIOL 0.25 MCG capsule  Commonly known as:  ROCALTROL  Take 1 capsule (0.25 mcg total) by mouth daily.     DSS 100 MG Caps  Take 100 mg by mouth 2  (two) times daily.     furosemide 80 MG tablet  Commonly known as:  LASIX  Take 1 tablet (80 mg total) by mouth daily.     gabapentin 300 MG capsule  Commonly known as:  NEURONTIN  Take 300 mg by mouth 2 (two) times daily.     insulin aspart 100 UNIT/ML injection  Commonly known as:  novoLOG  Inject 12-20 Units into the skin 3 (three) times daily before meals. Patient used 4 units this morning     insulin glargine 100 UNIT/ML injection  Commonly known as:  LANTUS  Inject 50 Units into the skin 2 (two) times daily.     oxyCODONE-acetaminophen 5-325 MG per tablet  Commonly known as:  PERCOCET/ROXICET  Take 1-2 tablets by mouth every 6 (six) hours as needed for severe pain.     primidone 50 MG tablet  Commonly known as:  MYSOLINE  Take 100 mg by mouth daily.     sevelamer carbonate 800 MG tablet  Commonly known as:  RENVELA  Take 1 tablet (800 mg total) by mouth 3 (three) times daily with meals.     traMADol 50 MG tablet  Commonly known as:  ULTRAM  Take 2 tablets (100 mg total) by mouth 2 (two) times daily.           Follow-up Information   Follow up with Faye Ramsay, MD. (As neededca ll my cell phone [phone])    Specialty:  Internal Medicine   Contact information:   [address] [place] [address] [phone]        The results of significant diagnostics from this hospitalization (including imaging, microbiology, ancillary and laboratory) are listed below for reference.     Microbiology: Recent Results (from the past 240 hour(s))  URINE CULTURE     Status: None   Collection Time    01/25/13  5:38 AM      Result Value Range Status   Specimen Description URINE, RANDOM   Final   Special Requests NONE   Final   Culture  Setup Time     Final   Value: 01/25/2013 12:04     Performed at [place] [facility]     Final   Value: 3,000 COLONIES/ML     Performed at [facility]   Culture     Final   Value:  INSIGNIFICANT GROWTH     Performed at [facility]   Report Status 01/26/2013 FINAL   Final  CULTURE, BLOOD (ROUTINE X 2)     Status: None   Collection Time    01/25/13 11:02 PM      Result Value Range Status   Specimen Description BLOOD LEFT HAND   Final   Special Requests BOTTLES DRAWN AEROBIC AND ANAEROBIC 10CC   Final   Culture  Setup Time     Final   Value: 01/26/2013 09:03     Performed at [facility]   Culture     Final   Value: NO GROWTH 5 DAYS     Performed at [facility]   Report Status 02/01/2013 FINAL   Final  CULTURE, BLOOD (ROUTINE X 2)     Status: None   Collection Time    01/25/13 11:06 PM      Result Value Range Status   Specimen Description BLOOD LEFT ARM   Final   Special Requests BOTTLES DRAWN AEROBIC ONLY 4CC   Final   Culture  Setup Time     Final   Value: 01/26/2013 09:03     Performed at [facility]   Culture     Final   Value: NO GROWTH 5 DAYS     Performed at [facility]   Report Status 02/01/2013 FINAL   Final     Labs: Basic Metabolic Panel:  Recent Labs Lab 01/28/13 0524 01/29/13 0532 01/30/13 0530 01/30/13 1444 01/31/13 0400 02/01/13 0500  NA 133* 132* 132*  --  136* 139  K 4.8 4.8 5.7* 4.7 4.3 4.4  CL 94* 93* 95*  --  98 101  CO2 21 20 20   --  23 23  GLUCOSE 234* 184* 167*  --  133*  107*  BUN 113* 132* 134*  --  117* 96*  CREATININE 2.91* 2.92* 2.57*  --  2.17* 1.96*  CALCIUM 9.1 9.3 9.4  --  9.0 9.4  PHOS  --   --  6.2*  --  4.5 3.9   Liver Function Tests:  Recent Labs Lab 01/30/13 0530 01/31/13 0400 02/01/13 0500  ALBUMIN 2.5* 2.3* 2.4*   CBC:  Recent Labs Lab 01/26/13 0506 01/27/13 0540 01/29/13 0532  WBC 11.0* 12.7* 12.2*  HGB 9.2* 9.4* 9.8*  HCT 27.2* 28.0* 29.2*  MCV 87.2 87.5 86.9  PLT 326 348 386   Cardiac Enzymes:  Recent Labs Lab 01/25/13 1247 01/25/13 1815  TROPONINI <0.30 <0.30   BNP: BNP (last 3 results)  Recent Labs  01/24/13 2350  01/25/13 0615  PROBNP 655.6* 785.0*   CBG:  Recent Labs Lab 01/31/13 1707 01/31/13 2153 02/01/13 0252 02/01/13 0754 02/01/13 1148  GLUCAP 90 82 123* 73 170*     SIGNED: Time coordinating discharge: Over 30 minutes  Faye Ramsay, MD  [facility] 02/01/2013, 12:26 PM Pager [phone]   If 7PM-7AM, please contact night-coverage [email or web address] Password [email or web address]

## 2013-02-01 NOTE — Progress Notes (Signed)
More awake, able to eat and take hs meds. Will cont to monitor.

## 2013-02-01 NOTE — Progress Notes (Signed)
CBG at 3am was 123, cont to hold Lantus. Pt without distress.

## 2013-02-01 NOTE — Progress Notes (Signed)
PT Cancellation Note  Patient Details Name: Mitchell Garcia MRN: [id number] DOB: 1945-08-17   Cancelled Treatment:    Reason Eval/Treat Not Completed: pt declined to participate at this time. Will check back another day if pt does not d/c later.    Weston Anna, MPT Pager: [phone]

## 2013-02-01 NOTE — Progress Notes (Signed)
Spoke to [employer] at [facility] and gave handoff report. Kelcy Baeten A

## 2013-02-01 NOTE — Discharge Instructions (Signed)
Kidney Disease, Adult The kidneys are two organs that lie on either side of the spine between the middle of the back and the front of the abdomen. The kidneys:   Remove wastes and extra water from the blood.   Produce important hormones. These regulate blood pressure, help keep bones strong, and help create red blood cells.   Balance the fluids and chemicals in the blood and tissues. Kidney disease occurs when the kidneys are damaged. Kidney damage may be sudden (acute) or develop over a long period (chronic). A small amount of damage may not cause problems, but a large amount of damage may make it difficult or impossible for the kidneys to work the way they should. Early detection and treatment of kidney disease may prevent kidney damage from becoming permanent or getting worse. Some kidney diseases are curable, but most are not. Many people with kidney disease are able to control the disease and live a normal life.  TYPES OF KIDNEY DISEASE  Acute kidney injury.Acute kidney injury occurs when there is sudden damage to the kidneys.  Chronic kidney disease. Chronic kidney disease occurs when the kidneys are damaged over a long period.  End-stage kidney disease. End-stage kidney disease occurs when the kidneys are so damaged that they stop working. In end-stage kidney disease, the kidneys cannot get better. CAUSES Any condition, disease, or event that damages the kidneys may cause kidney disease. Acute kidney injury.  A problem with blood flow to the kidneys. This may be caused by:   Blood loss.   Heart disease.   Severe burns.   Liver disease.  Direct damage to the kidneys. This may be caused by:  Some medicines.   A kidney infection.   Poisoning or consuming toxic substances.   A surgical wound.   A blow to the kidney area.   A problem with urine flow. This may be caused by:   Cancer.   Kidney stones.   An enlarged prostate. Chronic kidney disease. The  most common causes of chronic kidney disease are diabetes and high blood pressure (hypertension). Chronic kidney disease may also be caused by:   Diseases that cause the filtering units of the kidneys to become inflamed.   Diseases that affect the immune system.   Genetic diseases.   Medicines that damage the kidneys, such as anti-inflammatory medicines.  Poisoning or exposure to toxic substances.   A reoccurring kidney or urinary infection.   A problem with urine flow. This may be caused by:  Cancer.   Kidney stones.   An enlarged prostate in males. End-stage kidney disease. This kidney disease usually occurs when a chronic kidney disease gets worse. It may also occur after acute kidney injury.  SYMPTOMS   Swelling (edema) of the legs, ankles, or feet.   Tiredness (lethargy).   Nausea or vomiting.   Confusion.   Problems with urination, such as:   Painful or burning feeling during urination.   Decreased urine production.  Bloody urine.   Frequent urination, especially at night.  Hypertension.  Muscle twitches and cramps.   Shortness of breath.   Persistent itchiness.   Loss of appetite.  Metallic taste in the mouth.   Weakness.   Seizures.   Chest pain or pressure.   Trouble sleeping.   Headaches.   Abnormally dark or light skin.   Numbness in the hands or feet.   Easy bruising.   Frequent hiccups.   Menstruation stops. Sometimes, no symptoms are present. DIAGNOSIS  Kidney  mouth.    Weakness.    Seizures.    Chest pain or pressure.    Trouble sleeping.    Headaches.    Abnormally dark or light skin.    Numbness in the hands or feet.    Easy bruising.    Frequent hiccups.    Menstruation stops.  Sometimes, no symptoms are present.  DIAGNOSIS   Kidney disease may be detected and diagnosed by tests, including blood, urine, imaging, or kidney biopsy tests.   TREATMENT   Acute kidney injury. Treatment of acute kidney injury varies depending on the cause and severity of the kidney damage. In mild cases, no treatment may be needed. The kidneys may heal on their own. If acute kidney injury is more severe, your caregiver will treat the cause of the kidney damage, help the kidneys heal, and  prevent complications from occurring. Severe cases may require a procedure to remove toxic wastes from the body (dialysis) or surgery to repair kidney damage. Surgery may involve:    Repair of a torn kidney.    Removal of an obstruction.   Most of the time, you will need to stay overnight at the hospital.   Chronic kidney disease. Most chronic kidney diseases cannot be cured. Treatment usually involves relieving symptoms and preventing or slowing the progression of the disease. Treatment may include:    A special diet. You may need to avoid alcohol and foods that:    Have added salt.    Are high in potassium.    Are high in protein.    Medicines. These may:    Lower blood pressure.    Relieve anemia.    Relieve swelling.    Protect the bones.   End-stage kidney disease. End-stage kidney disease is life-threatening and must be treated immediately. There are two treatments for end-stage kidney disease:    Dialysis.    Receiving a new kidney (kidney transplant).  Both of these treatments have serious risks and consequences. In addition to having dialysis or a kidney transplant, you may need to take medicines to control hypertension and cholesterol and to decrease phosphorus levels in your blood.  LENGTH OF ILLNESS   Acute kidney injury.The length of this disease varies greatly from person to person. Exactly how long it lasts depends on the cause of the kidney damage. Acute kidney injury may develop into chronic kidney disease or end-stage kidney disease.   Chronic kidney disease. This disease usually lasts a lifetime. Chronic kidney disease may worsen over time to become end-stage kidney disease. The time it takes for end-stage kidney disease to develop varies from person to person.   End-stage kidney disease. This disease lasts until a kidney transplant is performed.  PREVENTION   Kidney disease can sometimes be prevented. If you have diabetes, hypertension, or any other condition that may  lead to kidney disease, you should try to prevent kidney disease with:    An appropriate diet.   Medicine.   Lifestyle changes.  FOR MORE INFORMATION   American Association of Kidney Patients: www.aakp.org   National Kidney Foundation: www.kidney.org   American Kidney Fund: www.akfinc.org   Life Options Rehabilitation Program: www.lifeoptions.org and www.kidneyschool.org   Document Released: 01/11/2005 Document Revised: 12/29/2011 Document Reviewed: 09/10/2011  [employer] Patient Information 2014 [employer].

## 2016-10-04 ENCOUNTER — Inpatient Hospital Stay (HOSPITAL_COMMUNITY): Payer: Non-veteran care

## 2016-10-04 ENCOUNTER — Inpatient Hospital Stay (HOSPITAL_COMMUNITY)
Admission: EM | Admit: 2016-10-04 | Discharge: 2016-10-18 | DRG: 246 | Disposition: A | Discharge status: Home / Self Care | Payer: Non-veteran care | Attending: Cardiovascular Disease | Admitting: Cardiovascular Disease

## 2016-10-04 ENCOUNTER — Encounter (HOSPITAL_COMMUNITY): Payer: Self-pay | Admitting: *Deleted

## 2016-10-04 DIAGNOSIS — I509 Heart failure, unspecified: Secondary | ICD-10-CM

## 2016-10-04 DIAGNOSIS — N184 Chronic kidney disease, stage 4 (severe): Secondary | ICD-10-CM | POA: Diagnosis present

## 2016-10-04 DIAGNOSIS — I5031 Acute diastolic (congestive) heart failure: Secondary | ICD-10-CM

## 2016-10-04 DIAGNOSIS — Z7982 Long term (current) use of aspirin: Secondary | ICD-10-CM

## 2016-10-04 DIAGNOSIS — T8089XA Other complications following infusion, transfusion and therapeutic injection, initial encounter: Secondary | ICD-10-CM | POA: Diagnosis not present

## 2016-10-04 DIAGNOSIS — I471 Supraventricular tachycardia: Secondary | ICD-10-CM | POA: Diagnosis not present

## 2016-10-04 DIAGNOSIS — I2510 Atherosclerotic heart disease of native coronary artery without angina pectoris: Secondary | ICD-10-CM

## 2016-10-04 DIAGNOSIS — I5023 Acute on chronic systolic (congestive) heart failure: Secondary | ICD-10-CM | POA: Diagnosis not present

## 2016-10-04 DIAGNOSIS — I10 Essential (primary) hypertension: Secondary | ICD-10-CM

## 2016-10-04 DIAGNOSIS — Y92239 Unspecified place in hospital as the place of occurrence of the external cause: Secondary | ICD-10-CM | POA: Diagnosis not present

## 2016-10-04 DIAGNOSIS — I5041 Acute combined systolic (congestive) and diastolic (congestive) heart failure: Secondary | ICD-10-CM | POA: Diagnosis not present

## 2016-10-04 DIAGNOSIS — E1129 Type 2 diabetes mellitus with other diabetic kidney complication: Secondary | ICD-10-CM

## 2016-10-04 DIAGNOSIS — I252 Old myocardial infarction: Secondary | ICD-10-CM | POA: Diagnosis not present

## 2016-10-04 DIAGNOSIS — I132 Hypertensive heart and chronic kidney disease with heart failure and with stage 5 chronic kidney disease, or end stage renal disease: Secondary | ICD-10-CM | POA: Diagnosis not present

## 2016-10-04 DIAGNOSIS — Z794 Long term (current) use of insulin: Secondary | ICD-10-CM | POA: Diagnosis not present

## 2016-10-04 DIAGNOSIS — N189 Chronic kidney disease, unspecified: Secondary | ICD-10-CM

## 2016-10-04 DIAGNOSIS — R001 Bradycardia, unspecified: Secondary | ICD-10-CM | POA: Diagnosis not present

## 2016-10-04 DIAGNOSIS — I25119 Atherosclerotic heart disease of native coronary artery with unspecified angina pectoris: Secondary | ICD-10-CM | POA: Diagnosis not present

## 2016-10-04 DIAGNOSIS — I214 Non-ST elevation (NSTEMI) myocardial infarction: Principal | ICD-10-CM | POA: Diagnosis present

## 2016-10-04 DIAGNOSIS — E669 Obesity, unspecified: Secondary | ICD-10-CM | POA: Diagnosis not present

## 2016-10-04 DIAGNOSIS — Z6836 Body mass index (BMI) 36.0-36.9, adult: Secondary | ICD-10-CM | POA: Diagnosis not present

## 2016-10-04 DIAGNOSIS — E1122 Type 2 diabetes mellitus with diabetic chronic kidney disease: Secondary | ICD-10-CM | POA: Diagnosis not present

## 2016-10-04 DIAGNOSIS — I69351 Hemiplegia and hemiparesis following cerebral infarction affecting right dominant side: Secondary | ICD-10-CM | POA: Diagnosis not present

## 2016-10-04 DIAGNOSIS — Z85828 Personal history of other malignant neoplasm of skin: Secondary | ICD-10-CM

## 2016-10-04 DIAGNOSIS — I5021 Acute systolic (congestive) heart failure: Secondary | ICD-10-CM | POA: Diagnosis not present

## 2016-10-04 DIAGNOSIS — I110 Hypertensive heart disease with heart failure: Secondary | ICD-10-CM | POA: Diagnosis not present

## 2016-10-04 DIAGNOSIS — I440 Atrioventricular block, first degree: Secondary | ICD-10-CM | POA: Diagnosis not present

## 2016-10-04 DIAGNOSIS — D638 Anemia in other chronic diseases classified elsewhere: Secondary | ICD-10-CM | POA: Diagnosis not present

## 2016-10-04 DIAGNOSIS — Z9861 Coronary angioplasty status: Secondary | ICD-10-CM

## 2016-10-04 DIAGNOSIS — E1165 Type 2 diabetes mellitus with hyperglycemia: Secondary | ICD-10-CM | POA: Diagnosis present

## 2016-10-04 DIAGNOSIS — I255 Ischemic cardiomyopathy: Secondary | ICD-10-CM | POA: Diagnosis not present

## 2016-10-04 DIAGNOSIS — I4510 Unspecified right bundle-branch block: Secondary | ICD-10-CM | POA: Diagnosis not present

## 2016-10-04 DIAGNOSIS — M7989 Other specified soft tissue disorders: Secondary | ICD-10-CM | POA: Diagnosis present

## 2016-10-04 DIAGNOSIS — D631 Anemia in chronic kidney disease: Secondary | ICD-10-CM | POA: Diagnosis present

## 2016-10-04 DIAGNOSIS — Z8349 Family history of other endocrine, nutritional and metabolic diseases: Secondary | ICD-10-CM

## 2016-10-04 DIAGNOSIS — I5033 Acute on chronic diastolic (congestive) heart failure: Secondary | ICD-10-CM | POA: Diagnosis present

## 2016-10-04 DIAGNOSIS — Z955 Presence of coronary angioplasty implant and graft: Secondary | ICD-10-CM

## 2016-10-04 DIAGNOSIS — N179 Acute kidney failure, unspecified: Secondary | ICD-10-CM

## 2016-10-04 DIAGNOSIS — Y848 Other medical procedures as the cause of abnormal reaction of the patient, or of later complication, without mention of misadventure at the time of the procedure: Secondary | ICD-10-CM | POA: Diagnosis not present

## 2016-10-04 DIAGNOSIS — R0600 Dyspnea, unspecified: Secondary | ICD-10-CM | POA: Diagnosis not present

## 2016-10-04 DIAGNOSIS — Z79899 Other long term (current) drug therapy: Secondary | ICD-10-CM | POA: Diagnosis not present

## 2016-10-04 DIAGNOSIS — R0602 Shortness of breath: Secondary | ICD-10-CM | POA: Diagnosis not present

## 2016-10-04 DIAGNOSIS — Z23 Encounter for immunization: Secondary | ICD-10-CM | POA: Diagnosis not present

## 2016-10-04 DIAGNOSIS — I219 Acute myocardial infarction, unspecified: Secondary | ICD-10-CM | POA: Insufficient documentation

## 2016-10-04 DIAGNOSIS — N183 Chronic kidney disease, stage 3 (moderate): Secondary | ICD-10-CM | POA: Diagnosis not present

## 2016-10-04 DIAGNOSIS — I5043 Acute on chronic combined systolic (congestive) and diastolic (congestive) heart failure: Secondary | ICD-10-CM | POA: Diagnosis not present

## 2016-10-04 DIAGNOSIS — I25110 Atherosclerotic heart disease of native coronary artery with unstable angina pectoris: Secondary | ICD-10-CM | POA: Diagnosis not present

## 2016-10-04 HISTORY — DX: Type 2 diabetes mellitus without complications: E11.9

## 2016-10-04 HISTORY — DX: Hyperlipidemia, unspecified: E78.5

## 2016-10-04 HISTORY — DX: Reserved for inherently not codable concepts without codable children: IMO0001

## 2016-10-04 HISTORY — DX: Long term (current) use of insulin: Z79.4

## 2016-10-04 LAB — TROPONIN I
TROPONIN I: 22.55 ng/mL — AB (ref <0.03)
Troponin I: 30.58 ng/mL (ref <0.03)

## 2016-10-04 LAB — CBC
HEMATOCRIT: 36 % — AB (ref 39.0–52.0)
HEMOGLOBIN: 11.3 g/dL — AB (ref 13.0–17.0)
MCH: 28.8 pg (ref 26.0–34.0)
MCHC: 31.4 g/dL (ref 30.0–36.0)
MCV: 91.8 fL (ref 78.0–100.0)
Platelets: 294 10*3/uL (ref 150–400)
RBC: 3.92 MIL/uL — ABNORMAL LOW (ref 4.22–5.81)
RDW: 14.2 % (ref 11.5–15.5)
WBC: 12.5 10*3/uL — ABNORMAL HIGH (ref 4.0–10.5)

## 2016-10-04 LAB — BASIC METABOLIC PANEL
ANION GAP: 8 (ref 5–15)
BUN: 50 mg/dL — ABNORMAL HIGH (ref 6–20)
CO2: 23 mmol/L (ref 22–32)
Calcium: 8.9 mg/dL (ref 8.9–10.3)
Chloride: 108 mmol/L (ref 101–111)
Creatinine, Ser: 2.93 mg/dL — ABNORMAL HIGH (ref 0.61–1.24)
GFR calc Af Amer: 23 mL/min — ABNORMAL LOW (ref >60)
GFR, EST NON AFRICAN AMERICAN: 20 mL/min — AB (ref >60)
Glucose, Bld: 140 mg/dL — ABNORMAL HIGH (ref 65–99)
Potassium: 3.9 mmol/L (ref 3.5–5.1)
SODIUM: 139 mmol/L (ref 135–145)

## 2016-10-04 LAB — I-STAT TROPONIN, ED: Troponin i, poc: 13.35 ng/mL (ref 0.00–0.08)

## 2016-10-04 LAB — HEPARIN LEVEL (UNFRACTIONATED): HEPARIN UNFRACTIONATED: 1.58 [IU]/mL — AB (ref 0.30–0.70)

## 2016-10-04 LAB — ECHOCARDIOGRAM COMPLETE
HEIGHTINCHES: 71 in
Weight: 4000 oz

## 2016-10-04 LAB — GLUCOSE, CAPILLARY: Glucose-Capillary: 250 mg/dL — ABNORMAL HIGH (ref 65–99)

## 2016-10-04 LAB — BRAIN NATRIURETIC PEPTIDE: B Natriuretic Peptide: 943.6 pg/mL — ABNORMAL HIGH (ref 0.0–100.0)

## 2016-10-04 MED ORDER — NITROGLYCERIN 2 % TD OINT
1.0000 [in_us] | TOPICAL_OINTMENT | Freq: Four times a day (QID) | TRANSDERMAL | Status: DC
  Administered 2016-10-04: 1 [in_us] via TOPICAL
  Filled 2016-10-04: qty 1

## 2016-10-04 MED ORDER — PRIMIDONE 50 MG PO TABS
100.0000 mg | ORAL_TABLET | Freq: Every day | ORAL | Status: DC
  Administered 2016-10-05 – 2016-10-18 (×14): 100 mg via ORAL
  Filled 2016-10-04 (×14): qty 2

## 2016-10-04 MED ORDER — INSULIN ASPART 100 UNIT/ML ~~LOC~~ SOLN
0.0000 [IU] | Freq: Every day | SUBCUTANEOUS | Status: DC
  Administered 2016-10-05: 2 [IU] via SUBCUTANEOUS
  Administered 2016-10-05: 5 [IU] via SUBCUTANEOUS
  Administered 2016-10-06 – 2016-10-07 (×2): 3 [IU] via SUBCUTANEOUS
  Administered 2016-10-08 – 2016-10-09 (×2): 4 [IU] via SUBCUTANEOUS
  Administered 2016-10-11 (×2): 5 [IU] via SUBCUTANEOUS
  Administered 2016-10-12: 4 [IU] via SUBCUTANEOUS
  Administered 2016-10-13: 3 [IU] via SUBCUTANEOUS
  Administered 2016-10-17: 4 [IU] via SUBCUTANEOUS

## 2016-10-04 MED ORDER — INSULIN ASPART 100 UNIT/ML ~~LOC~~ SOLN
0.0000 [IU] | Freq: Three times a day (TID) | SUBCUTANEOUS | Status: DC
  Administered 2016-10-05: 2 [IU] via SUBCUTANEOUS
  Administered 2016-10-05: 7 [IU] via SUBCUTANEOUS
  Administered 2016-10-05: 5 [IU] via SUBCUTANEOUS
  Administered 2016-10-06: 9 [IU] via SUBCUTANEOUS
  Administered 2016-10-06: 7 [IU] via SUBCUTANEOUS
  Administered 2016-10-06 (×2): 3 [IU] via SUBCUTANEOUS
  Administered 2016-10-07: 7 [IU] via SUBCUTANEOUS
  Administered 2016-10-07: 5 [IU] via SUBCUTANEOUS
  Administered 2016-10-07: 3 [IU] via SUBCUTANEOUS
  Administered 2016-10-08: 1 [IU] via SUBCUTANEOUS
  Administered 2016-10-08: 2 [IU] via SUBCUTANEOUS
  Administered 2016-10-08: 3 [IU] via SUBCUTANEOUS
  Administered 2016-10-09: 7 [IU] via SUBCUTANEOUS
  Administered 2016-10-09: 3 [IU] via SUBCUTANEOUS
  Administered 2016-10-10: 5 [IU] via SUBCUTANEOUS
  Administered 2016-10-10: 3 [IU] via SUBCUTANEOUS
  Administered 2016-10-10: 5 [IU] via SUBCUTANEOUS
  Administered 2016-10-11 (×2): 9 [IU] via SUBCUTANEOUS
  Administered 2016-10-12 (×2): 5 [IU] via SUBCUTANEOUS
  Administered 2016-10-12: 3 [IU] via SUBCUTANEOUS
  Administered 2016-10-13 – 2016-10-14 (×2): 2 [IU] via SUBCUTANEOUS
  Administered 2016-10-14: 1 [IU] via SUBCUTANEOUS
  Administered 2016-10-14 – 2016-10-15 (×2): 2 [IU] via SUBCUTANEOUS
  Administered 2016-10-15: 1 [IU] via SUBCUTANEOUS
  Administered 2016-10-15: 3 [IU] via SUBCUTANEOUS
  Administered 2016-10-16: 2 [IU] via SUBCUTANEOUS
  Administered 2016-10-16 – 2016-10-17 (×2): 1 [IU] via SUBCUTANEOUS
  Administered 2016-10-17: 5 [IU] via SUBCUTANEOUS
  Administered 2016-10-18: 2 [IU] via SUBCUTANEOUS

## 2016-10-04 MED ORDER — ASPIRIN 81 MG PO CHEW
324.0000 mg | CHEWABLE_TABLET | Freq: Once | ORAL | Status: AC
  Administered 2016-10-04: 324 mg via ORAL
  Filled 2016-10-04: qty 4

## 2016-10-04 MED ORDER — GABAPENTIN 300 MG PO CAPS
300.0000 mg | ORAL_CAPSULE | Freq: Two times a day (BID) | ORAL | Status: DC
  Administered 2016-10-05 – 2016-10-18 (×28): 300 mg via ORAL
  Filled 2016-10-04 (×28): qty 1

## 2016-10-04 MED ORDER — NITROGLYCERIN 0.4 MG SL SUBL: 0.4000 mg | SUBLINGUAL_TABLET | SUBLINGUAL | Status: DC | PRN

## 2016-10-04 MED ORDER — HEPARIN (PORCINE) IN NACL 100-0.45 UNIT/ML-% IJ SOLN
1200.0000 [IU]/h | INTRAMUSCULAR | Status: DC
  Administered 2016-10-05: 1200 [IU]/h via INTRAVENOUS
  Administered 2016-10-05: 1000 [IU]/h via INTRAVENOUS
  Filled 2016-10-04: qty 250

## 2016-10-04 MED ORDER — ACETAMINOPHEN 325 MG PO TABS
650.0000 mg | ORAL_TABLET | ORAL | Status: DC | PRN
  Administered 2016-10-17: 650 mg via ORAL
  Filled 2016-10-04: qty 2

## 2016-10-04 MED ORDER — HEPARIN BOLUS VIA INFUSION
4000.0000 [IU] | Freq: Once | INTRAVENOUS | Status: AC
  Administered 2016-10-04: 4000 [IU] via INTRAVENOUS
  Filled 2016-10-04: qty 4000

## 2016-10-04 MED ORDER — INSULIN GLARGINE 100 UNIT/ML ~~LOC~~ SOLN: 50.0000 [IU] | Freq: Two times a day (BID) | SUBCUTANEOUS | Status: DC

## 2016-10-04 MED ORDER — HEPARIN (PORCINE) IN NACL 100-0.45 UNIT/ML-% IJ SOLN
1200.0000 [IU]/h | INTRAMUSCULAR | Status: DC
  Administered 2016-10-04: 1200 [IU]/h via INTRAVENOUS
  Filled 2016-10-04: qty 250

## 2016-10-04 MED ORDER — FUROSEMIDE 10 MG/ML IJ SOLN
80.0000 mg | INTRAMUSCULAR | Status: AC
  Administered 2016-10-04: 80 mg via INTRAVENOUS
  Filled 2016-10-04: qty 8

## 2016-10-04 MED ORDER — ONDANSETRON HCL 4 MG/2ML IJ SOLN: 4.0000 mg | Freq: Four times a day (QID) | INTRAMUSCULAR | Status: DC | PRN

## 2016-10-04 MED ORDER — ASPIRIN 81 MG PO CHEW: 324.0000 mg | CHEWABLE_TABLET | ORAL | Status: AC

## 2016-10-04 MED ORDER — PERFLUTREN LIPID MICROSPHERE
1.0000 mL | INTRAVENOUS | Status: AC | PRN
  Administered 2016-10-04: 2 mL via INTRAVENOUS
  Filled 2016-10-04: qty 10

## 2016-10-04 MED ORDER — ASPIRIN 300 MG RE SUPP: 300.0000 mg | RECTAL | Status: AC

## 2016-10-04 MED ORDER — ASPIRIN EC 81 MG PO TBEC
81.0000 mg | DELAYED_RELEASE_TABLET | Freq: Every day | ORAL | Status: DC
  Administered 2016-10-05 – 2016-10-18 (×14): 81 mg via ORAL
  Filled 2016-10-04 (×13): qty 1

## 2016-10-04 MED ORDER — ATORVASTATIN CALCIUM 80 MG PO TABS
80.0000 mg | ORAL_TABLET | Freq: Every day | ORAL | Status: DC
  Administered 2016-10-05 – 2016-10-17 (×14): 80 mg via ORAL
  Filled 2016-10-04 (×14): qty 1

## 2016-10-04 MED ORDER — NITROGLYCERIN IN D5W 200-5 MCG/ML-% IV SOLN
10.0000 ug/min | INTRAVENOUS | Status: DC
  Administered 2016-10-04: 10 ug/min via INTRAVENOUS
  Administered 2016-10-05: 35 ug/min via INTRAVENOUS
  Administered 2016-10-05: 10 ug/min via INTRAVENOUS
  Administered 2016-10-06 – 2016-10-08 (×4): 35 ug/min via INTRAVENOUS
  Administered 2016-10-09 – 2016-10-13 (×3): 10 ug/min via INTRAVENOUS
  Filled 2016-10-04 (×7): qty 250

## 2016-10-04 MED ORDER — FUROSEMIDE 10 MG/ML IJ SOLN
80.0000 mg | Freq: Every day | INTRAMUSCULAR | Status: DC
  Administered 2016-10-05: 80 mg via INTRAVENOUS
  Filled 2016-10-04: qty 8

## 2016-10-04 NOTE — ED Notes (Signed)
Paged admitting to [place], [place]

## 2016-10-04 NOTE — Progress Notes (Addendum)
ANTICOAGULATION CONSULT NOTE - F/u Consult  Pharmacy Consult for heparin Indication: chest pain/ACS  Allergies  Allergen Reactions  . Lisinopril     Hyperkalemia   . Losartan     Hyperkalemia     Patient Measurements: Height: 5\' 11"  (180.3 cm) Weight: 259 lb 8 oz (117.7 kg) IBW/kg (Calculated) : 75.3 Heparin Dosing Weight: 100kg  Vital Signs: Temp: 97.8 F (36.6 C) (09/10 2259) Temp Source: Oral (09/10 2259) BP: 168/79 (09/10 2300) Pulse Rate: 86 (09/10 2215)  Labs:  Recent Labs  10/04/16 1210 10/04/16 1625 10/04/16 2155  HGB 11.3*  --   --   HCT 36.0*  --   --   PLT 294  --   --   HEPARINUNFRC  --   --  1.58*  CREATININE 2.93*  --   --   TROPONINI  --  22.55* 30.58*    Estimated Creatinine Clearance: 29.3 mL/min (A) (by C-G formula based on SCr of 2.93 mg/dL (H)).   Medical History: Past Medical History:  Diagnosis Date  . Cancer ([place])   . CHF (congestive heart failure) ([place])   . Chronic kidney disease   . Diabetes mellitus with renal complications ([facility])    uncontrolled  . Hypertension   . MI, old    years ago  . Stroke [facility])     Medications:  Infusions:  . heparin 1,200 Units/hr (10/04/16 1409)  . nitroGLYCERIN 15 mcg/min (10/04/16 2221)    Assessment: 48 yom presented to the ED with CP x 1 week and SOB. Troponin significantly elevated so now starting IV heparin.   Heparin level supratherapeutic at 1.58 - RN unable to tell where heparin was drawn from and lab was drawn in ED prior to transfer. CBC stable and no bleeding noted per RN.   Goal of Therapy:  Heparin level 0.3-0.7 units/ml Monitor platelets by anticoagulation protocol: Yes   Plan:  Hold heparin gtt x1 hr, then resume heparin gtt at 1000 units/hr Heparin level in 8 hrs after resumed Daily heparin level and CBC Monitor for s/s bleeding   ADDENDUM:  Heparin level drawn ~4 hrs since last, is low as expected, at 0.13 Drastic drop from 1.58>0.13 from previous level suggests  previous level likely drawn too close to the gtt In setting of marked elevated troponin, will increase heparin gtt back to original rate of 1200 units/hr Heparin level in 8 hrs and daily with CBC  Monitor for s/s bleeding  Argie Ramming, PharmD Clinical Pharmacist 10/04/16 11:26 PM

## 2016-10-04 NOTE — H&P (Signed)
Cardiology Admission History and Physical:   Patient ID: RENALD HAITHCOCK; MRN: [id number]; DOB: [birth year]   Admission date: 10/04/2016  Primary Care Provider: No primary care provider on file. Primary Cardiologist: New Primary Electrophysiologist:  N/a  Chief Complaint:  Dyspnea  Patient Profile:   Mitchell Garcia is a 71 y.o. male with a history of NIDDM, HTN, Stroke, CKD, HTN, chronic diastolic CHF who presented from the [facility] office with progressive dyspnea.   History of Present Illness:   Mitchell Garcia is a 71 yo male with PMH of NIDDM, HTN, Stroke, CKD, HTN, chronic diastolic CHF who is followed by the [place] in [place]. Denies any known history of CAD and [facility] Never had a cath or stress test to his knowledge. Had an echo in 1/15 that showed normal EF with G2DD.   Reports he was in his usual state of health up until about 3 weeks ago. Began to experience orthopnea and PND with abdominal and lower extremity edema. Also reports a nonproductive cough. States he was attempting to treat with over-the-counter cough medicine and cough drops without much relief. Also reports intermittent episodes of chest burning not associated with rest or activity. Presented to the [facility] for an appointment today, and was transferred to [facility] for further workup.   In the ED his labs showed stable electrolytes, creatinine 2.9 (baseline appears 2.0~2.9), hemoglobin 11.3, POC troponin 13.35. EKG showed possible atrial fibrillation, but P waves are apparent in several leads. Chest x-ray, and BNP are pending at the time of evaluation. Patient currently reports no chest pain/burning. Breathing comfortably on room air at this time.  Past Medical History:  Diagnosis Date  . Cancer ([place])   . CHF (congestive heart failure) ([place])   . Chronic kidney disease   . Diabetes mellitus with renal complications (HCC)    uncontrolled  . Hypertension    . MI, old    years ago  . Stroke [facility])     Past Surgical History:  Procedure Laterality Date  . SKIN CANCER EXCISION       Medications Prior to Admission: Prior to Admission medications   Medication Sig Start Date End Date Taking? Authorizing Provider  ALPRAZolam Duanne Moron) 0.5 MG tablet Take 1 tablet (0.5 mg total) by mouth at bedtime as needed for anxiety. 02/01/13   Theodis Blaze, MD  amLODipine (NORVASC) 10 MG tablet Take 10 mg by mouth daily.    [provider]  aspirin EC 81 MG tablet Take 81 mg by mouth daily.    [provider]  atorvastatin (LIPITOR) 80 MG tablet Take 80 mg by mouth at bedtime.    [provider]  calcitRIOL (ROCALTROL) 0.25 MCG capsule Take 1 capsule (0.25 mcg total) by mouth daily. 02/01/13   Theodis Blaze, MD  docusate sodium 100 MG CAPS Take 100 mg by mouth 2 (two) times daily. 02/01/13   Theodis Blaze, MD  furosemide (LASIX) 80 MG tablet Take 1 tablet (80 mg total) by mouth daily. 02/01/13   Theodis Blaze, MD  gabapentin (NEURONTIN) 300 MG capsule Take 300 mg by mouth 2 (two) times daily.    [provider]  insulin aspart (NOVOLOG) 100 UNIT/ML injection Inject 12-20 Units into the skin 3 (three) times daily before meals. Patient used 4 units this morning    [provider]  insulin glargine (LANTUS) 100 UNIT/ML injection Inject 50 Units into the skin 2 (  two) times daily.    [provider]  oxyCODONE-acetaminophen (PERCOCET/ROXICET) 5-325 MG per tablet Take 1-2 tablets by mouth every 6 (six) hours as needed for severe pain. 02/01/13   Theodis Blaze, MD  primidone (MYSOLINE) 50 MG tablet Take 100 mg by mouth daily.    [provider]  sevelamer carbonate (RENVELA) 800 MG tablet Take 1 tablet (800 mg total) by mouth 3 (three) times daily with meals. 02/01/13   Theodis Blaze, MD  traMADol (ULTRAM) 50 MG tablet Take 2 tablets (100 mg total) by mouth 2 (two) times daily. 02/01/13   Theodis Blaze, MD      Allergies:    Allergies  Allergen Reactions  . Lisinopril     Hyperkalemia   . Losartan     Hyperkalemia     Social History:   Social History   Social History  . Marital status: Married    Spouse name: N/A  . Number of children: N/A  . Years of education: N/A   Occupational History  . Not on file.   Social History Main Topics  . Smoking status: Never Smoker  . Smokeless tobacco: Never Used  . Alcohol use No  . Drug use: No  . Sexual activity: Not on file   Other Topics Concern  . Not on file   Social History Narrative  . No narrative on file    Family History:   The patient's family history includes Hyperlipidemia in his father.    ROS:  Please see the history of present illness.  All other ROS reviewed and negative.     Physical Exam/Data:   Vitals:   10/04/16 1206 10/04/16 1300  BP: 140/64   Pulse: (!) 50   Resp: 16   Temp: 98.2 F (36.8 C)   TempSrc: Oral   SpO2: 97%   Weight:  250 lb (113.4 kg)  Height:  5\' 11"  (1.803 m)   No intake or output data in the 24 hours ending 10/04/16 1358 Filed Weights   10/04/16 1300  Weight: 250 lb (113.4 kg)   Body mass index is 34.87 kg/m.  General:  Well nourished, well developed, in no acute distress HEENT: normal Lymph: no adenopathy Neck: + JVD Endocrine:  No thryomegaly Vascular: No carotid bruits; FA pulses 2+ bilaterally without bruits  Cardiac:  normal S1, S2; RRR; no murmur  Lungs:  clear to auscultation bilaterally, no wheezing, rhonchi or rales  Abd: soft, nontender, no hepatomegaly  Ext: 2+ Pitting edema to bilateral LE Musculoskeletal:  No deformities, BUE and BLE strength normal and equal Skin: warm and dry  Neuro:  CNs 2-12 intact, no focal abnormalities noted Psych:  Normal affect    EKG:  The ECG that was done was personally reviewed and demonstrates possible atrial fibrillation, but p waves are noted in several leads   Relevant CV Studies: Echo: 1/15  Study Conclusions  -  Left ventricle: The cavity size was normal. There was mild concentric hypertrophy. Systolic function was normal. The estimated ejection fraction was in the range of 55% to 60%. Wall motion was normal; there were no regional wall motion abnormalities. Features are consistent with a pseudonormal left ventricular filling pattern, with concomitant abnormal relaxation and increased filling pressure (grade 2 diastolic dysfunction). Doppler parameters are consistent with elevated ventricular end-diastolic filling pressure. - Aortic valve: Mildly to moderately calcified annulus. Trileaflet; mildly thickened, mildly calcified leaflets. - Right ventricle: The cavity size was mildly dilated. Wall thickness was normal.  Laboratory Data:  Chemistry  Recent Labs Lab 10/04/16 1210  NA 139  K 3.9  CL 108  CO2 23  GLUCOSE 140*  BUN 50*  CREATININE 2.93*  CALCIUM 8.9  GFRNONAA 20*  GFRAA 23*  ANIONGAP 8    No results for input(s): PROT, ALBUMIN, AST, ALT, ALKPHOS, BILITOT in the last 168 hours. Hematology  Recent Labs Lab 10/04/16 1210  WBC 12.5*  RBC 3.92*  HGB 11.3*  HCT 36.0*  MCV 91.8  MCH 28.8  MCHC 31.4  RDW 14.2  PLT 294   Cardiac EnzymesNo results for input(s): TROPONINI in the last 168 hours.   Recent Labs Lab 10/04/16 1229  TROPIPOC 13.35*    BNPNo results for input(s): BNP, PROBNP in the last 168 hours.  DDimer No results for input(s): DDIMER in the last 168 hours.  Radiology/Studies:  No results found.  Assessment and Plan:   1. NSTEMI: Patient reports intermittent episodes of chest burning over the past several days. Denies any known history of CAD, or previous cardiac testing. Of note does have an echo on file from several years ago that shows normal EF with grade 2 diastolic dysfunction, and no wall motion abnormality. In the ED initial POC troponin was 13. Will ultimately need further ischemic workup, but creatinine 2.9 on  admission, and significantly volume overloaded.  -- start IV heparin, check echo, cycle troponins 2. Acute systolic HF: Suspect systolic HF with reported symptoms and findings on physical exam. Given 80mg  IV lasix in the ED. BNP and CXR pending in the ED.       -- follow UOP today. Plan for 80mg  daily with close monitoring of renal function       -- echo 3.    CKD: appears baseline renal function is around 2.5 in the past. 2.9 on admission. Will need to             follow closely with diuresis.      -- Daily BMET 4.    HTN: Will hold on adding BB at this time. No ACEi/ARB given CKD. Will add IV nitro given new               onset HF to afterload reduction.  5.   DM: Check Hgb A1c       -- SSI while inpatient 6.   Hx of Stroke: on asa and statin  Severity of Illness: The appropriate patient status for this patient is INPATIENT. Inpatient status is judged to be reasonable and necessary in order to provide the required intensity of service to ensure the patient's safety. The patient's presenting symptoms, physical exam findings, and initial radiographic and laboratory data in the context of their chronic comorbidities is felt to place them at high risk for further clinical deterioration. Furthermore, it is not anticipated that the patient will be medically stable for discharge from the hospital within 2 midnights of admission. The following factors support the patient status of inpatient.   " The patient's presenting symptoms include Dyspnea, chest pain, orthopnea, PND. " The worrisome physical exam findings include LE edema. " The initial radiographic and laboratory data are worrisome because of elevated Trop. " The chronic co-morbidities include HTN, DM, CHF.   * I certify that at the point of admission it is my clinical judgment that the patient will require inpatient hospital care spanning beyond 2 midnights from the point of admission due to high intensity of service, high risk for further  deterioration and high  frequency of surveillance required.*    For questions or updates, please contact [place] Please consult [email or web address] for contact info under Cardiology/STEMI. Daytime calls, contact the Day Call APP (6a-8a) or assigned team (Teams A-D) provider (7:30a - 5p). All other daytime calls (7:30-5p), contact the Card Master @ [phone].   Nighttime calls, contact the assigned APP (5p-8p) or MD (6:30p-8p). Overnight calls (8p-6a), contact the on call Fellow @ [phone].    Signed, [name], NP  10/04/2016 1:58 PM   I have personally seen and examined this patient with [name], NP. I agree with the assessment and plan as outlined above. He is a pleasant 71 yo male veteran with h/o DM, HTN, stage 3 CKD, prior CVA and chronic diastolic CHF who is admitted with dyspnea and chest pain. He has had no previous invasive cardiac workup. He is followed by cardiology at the [facility] for diastolic CHF. Last echo in 2015 in our system showed normal LV systolic function with grade 2 diastolic dysfunction. He reports worsening dyspnea over the last several weeks along with burning across his chest. He has orthopnea and LE edema as well as weight gain. He thought he may have lung cancer so he sought help today at the [place] and was promptly sent to our ED without any records. My exam shows an elderly male, dyspneic with talking. CV:RRR. No loud murmurs. Pulm: basilar crackles. Abd: distended, NT. Ext: 2-3+ pitting bilateral LE edema.  Labs reviewed by me and shows creatinine 2.93 (last creatinine in our system 1.96 three years ago). Troponin 13.  EKG shows sinus rhythm with 1st degree AV block, RBBB with short run of atrial tachycardia. No ischemic changes.  1. NSTEMI: He has elevated troponin. This may be due to his acute CHF exacerbation but given risk factors for CAD, will have to treat as ACS. Will start IV heparin, IV NTG. Will cycle troponin. Since his EKG shows no injury  current, will delay cath. Given his renal insufficiency, will have to follow renal function closely before planning cath. Continue ASA and statin.  2. Acute CHF: He is known to have diastolic dysfunction. Will begin diuresis with IV Lasix. Follow renal function closely. Echo today to assess LVEF.  Admit to stepdown ICU.  Attempt to get records from [place] 10/04/2016 2:38 PM

## 2016-10-04 NOTE — ED Triage Notes (Signed)
Patient was brought to the ED from [facility] c/o chest pain x 1 week with increased sob at night. States his ankles have been swelling, Patient states he wants to be checked for lung cancer.

## 2016-10-04 NOTE — ED Notes (Signed)
Ordered pt a dinner tray 

## 2016-10-04 NOTE — Progress Notes (Signed)
ANTICOAGULATION CONSULT NOTE - Initial Consult  Pharmacy Consult for heparin Indication: chest pain/ACS  Allergies  Allergen Reactions  . Lisinopril     Hyperkalemia   . Losartan     Hyperkalemia     Patient Measurements: Height: 5\' 11"  (180.3 cm) Weight: 250 lb (113.4 kg) (patient reported) IBW/kg (Calculated) : 75.3 Heparin Dosing Weight: 100kg  Vital Signs: Temp: 98.2 F (36.8 C) (09/10 1206) Temp Source: Oral (09/10 1206) BP: 140/64 (09/10 1206) Pulse Rate: 50 (09/10 1206)  Labs:  Recent Labs  10/04/16 1210  HGB 11.3*  HCT 36.0*  PLT 294  CREATININE 2.93*    Estimated Creatinine Clearance: 28.7 mL/min (A) (by C-G formula based on SCr of 2.93 mg/dL (H)).   Medical History: Past Medical History:  Diagnosis Date  . Cancer ([place])   . CHF (congestive heart failure) ([place])   . Chronic kidney disease   . Diabetes mellitus with renal complications (HCC)    uncontrolled  . Hypertension   . MI, old    years ago  . Stroke [facility])     Medications:  Infusions:  . heparin      Assessment: 27 yom presented to the ED with CP x 1 week and SOB. Troponin significantly elevated so now starting IV heparin. Baseline H/H is slightly low and platelets are WNL. He is not on anticoagulation PTA.   Goal of Therapy:  Heparin level 0.3-0.7 units/ml Monitor platelets by anticoagulation protocol: Yes   Plan:  Heparin bolus 4000 units IV x 1 Heparin gtt 1200 units/hr Check an 8 hr heparin level Daily heparin level and CBC  Margie Brink, Rande Lawman 10/04/2016,1:45 PM

## 2016-10-04 NOTE — ED Notes (Signed)
Heparin verified with Loma Sousa RN

## 2016-10-04 NOTE — ED Notes (Signed)
Spoke with cardiology on call to report trop 22.55

## 2016-10-04 NOTE — ED Notes (Signed)
Paged admitting to [place], [place]

## 2016-10-04 NOTE — ED Notes (Signed)
Pt's granddaughter asking multiple questions at different times.  Paged cardiology several times to answer granddaughters questions. Michela Pitcher granddaughter is now requesting to speak with cardiology.  Cardiology aware.

## 2016-10-04 NOTE — ED Provider Notes (Signed)
[facility] DEPT Provider Note   CSN: [id number] Arrival date & time: 10/04/16  1139     History   Chief Complaint Chief Complaint  Patient presents with  . Chest Pain  . Shortness of Breath    HPI Mitchell Garcia is a 71 y.o. male.  71yo M w/ PMH including CHF, HTN, T2DM, CVA, CKD who p/w chest pain. The patient reports a one-week history of intermittent chest pain associated with shortness of breath that is worse at night, worse when he tries to lay flat. He has had associated nausea. He does report a cough productive of phlegm. He has had bilateral ankle swelling and a 10 pound weight gain recently; family members also noticed that his abdomen is more distended. He has tried Vicks vapor rub on his chest without relief. Initially the pain has been in his upper belly radiating to his upper chest and he thought that it was indigestion.   The history is provided by the patient.  Chest Pain   Associated symptoms include shortness of breath.  Shortness of Breath  Associated symptoms include chest pain.    Past Medical History:  Diagnosis Date  . Cancer ([place])   . CHF (congestive heart failure) ([place])   . Chronic kidney disease   . Diabetes mellitus with renal complications (HCC)    uncontrolled  . Hypertension   . MI, old    years ago  . Stroke [facility])     Patient Active Problem List   Diagnosis Date Noted  . NSTEMI (non-ST elevated myocardial infarction) ([place]) 10/04/2016  . Acute on chronic diastolic heart failure ([place]) 01/27/2013  . Humerus head fracture 01/27/2013  . Altered mental state 01/25/2013  . Acute on chronic kidney failure ([place]) 01/25/2013  . Transaminitis 01/25/2013  . Leukocytosis 01/25/2013  . Anemia of chronic disease 01/25/2013  . Hyponatremia 01/25/2013  . Hyperglycemia 01/25/2013    Past Surgical History:  Procedure Laterality Date  . SKIN CANCER EXCISION         Home Medications    Prior to Admission medications   Medication Sig  Start Date End Date Taking? Authorizing Provider  ALPRAZolam Duanne Moron) 0.5 MG tablet Take 1 tablet (0.5 mg total) by mouth at bedtime as needed for anxiety. 02/01/13   Theodis Blaze, MD  amLODipine (NORVASC) 10 MG tablet Take 10 mg by mouth daily.    [provider]  aspirin EC 81 MG tablet Take 81 mg by mouth daily.    [provider]  atorvastatin (LIPITOR) 80 MG tablet Take 80 mg by mouth at bedtime.    [provider]  calcitRIOL (ROCALTROL) 0.25 MCG capsule Take 1 capsule (0.25 mcg total) by mouth daily. 02/01/13   Theodis Blaze, MD  docusate sodium 100 MG CAPS Take 100 mg by mouth 2 (two) times daily. 02/01/13   Theodis Blaze, MD  furosemide (LASIX) 80 MG tablet Take 1 tablet (80 mg total) by mouth daily. 02/01/13   Theodis Blaze, MD  gabapentin (NEURONTIN) 300 MG capsule Take 300 mg by mouth 2 (two) times daily.    [provider]  insulin aspart (NOVOLOG) 100 UNIT/ML injection Inject 12-20 Units into the skin 3 (three) times daily before meals. Patient used 4 units this morning    [provider]  insulin glargine (LANTUS) 100 UNIT/ML injection Inject 50 Units into the skin 2 (two) times daily.    [provider]  oxyCODONE-acetaminophen (PERCOCET/ROXICET) 5-325 MG per tablet Take 1-2  tablets by mouth every 6 (six) hours as needed for severe pain. 02/01/13   Theodis Blaze, MD  primidone (MYSOLINE) 50 MG tablet Take 100 mg by mouth daily.    [provider]  sevelamer carbonate (RENVELA) 800 MG tablet Take 1 tablet (800 mg total) by mouth 3 (three) times daily with meals. 02/01/13   Theodis Blaze, MD  traMADol (ULTRAM) 50 MG tablet Take 2 tablets (100 mg total) by mouth 2 (two) times daily. 02/01/13   Theodis Blaze, MD    Family History Family History  Problem Relation Age of Onset  . Hyperlipidemia Father     Social History Social History  Substance Use Topics  . Smoking status: Never Smoker  . Smokeless tobacco: Never Used  .  Alcohol use No     Allergies   Lisinopril and Losartan   Review of Systems Review of Systems  Respiratory: Positive for shortness of breath.   Cardiovascular: Positive for chest pain.   All other systems reviewed and are negative except that which was mentioned in HPI   Physical Exam Updated Vital Signs BP (!) 142/78   Pulse 64   Temp 98.2 F (36.8 C) (Oral)   Resp (!) 27   Ht 5\' 11"  (1.803 m)   Wt 113.4 kg (250 lb) Comment: patient reported  SpO2 96%   BMI 34.87 kg/m   Physical Exam  Constitutional: He is oriented to person, place, and time. He appears well-developed and well-nourished. No distress.  HENT:  Head: Normocephalic and atraumatic.  Eyes: Pupils are equal, round, and reactive to light. Conjunctivae are normal.  Neck: Neck supple.  Cardiovascular: Normal rate and regular rhythm.   Murmur heard. Pulmonary/Chest:  Dyspnea with mildly increased work of breathing, no respiratory distress; diminished breath sounds bilaterally  Abdominal: Soft. Bowel sounds are normal. He exhibits distension (mild). There is no tenderness.  Musculoskeletal: He exhibits edema (2+ pitting to knees).  Neurological: He is alert and oriented to person, place, and time.  Fluent speech  Skin: Skin is warm and dry.  Psychiatric: He has a normal mood and affect. Judgment normal.  Nursing note and vitals reviewed.    ED Treatments / Results  Labs (all labs ordered are listed, but only abnormal results are displayed) Labs Reviewed  BASIC METABOLIC PANEL - Abnormal; Notable for the following:       Result Value   Glucose, Bld 140 (*)    BUN 50 (*)    Creatinine, Ser 2.93 (*)    GFR calc non Af Amer 20 (*)    GFR calc Af Amer 23 (*)    All other components within normal limits  CBC - Abnormal; Notable for the following:    WBC 12.5 (*)    RBC 3.92 (*)    Hemoglobin 11.3 (*)    HCT 36.0 (*)    All other components within normal limits  I-STAT TROPONIN, ED - Abnormal; Notable  for the following:    Troponin i, poc 13.35 (*)    All other components within normal limits  BRAIN NATRIURETIC PEPTIDE  HEPARIN LEVEL (UNFRACTIONATED)  TROPONIN I  TROPONIN I  TROPONIN I    EKG  EKG Interpretation  Date/Time:  Monday October 04 2016 12:01:13 EDT Ventricular Rate:  71 PR Interval:    QRS Duration: 144 QT Interval:  426 QTC Calculation: 462 R Axis:   48 Text Interpretation:  Atrial fibrillation with a competing junctional pacemaker with premature ventricular or aberrantly  conducted complexes Right bundle branch block Abnormal ECG No previous ECGs available Confirmed by Theotis Burrow [phone]) on 10/04/2016 12:56:22 PM       Radiology No results found.  Procedures .Critical Care Performed by: Sharlett Iles Authorized by: Sharlett Iles   Critical care provider statement:    Critical care time (minutes):  40   Critical care time was exclusive of:  Separately billable procedures and treating other patients   Critical care was necessary to treat or prevent imminent or life-threatening deterioration of the following conditions:  Cardiac failure   Critical care was time spent personally by me on the following activities:  Development of treatment plan with patient or surrogate, examination of patient, obtaining history from patient or surrogate, ordering and performing treatments and interventions, ordering and review of laboratory studies, ordering and review of radiographic studies and re-evaluation of patient's condition   (including critical care time)  Medications Ordered in ED Medications  heparin ADULT infusion 100 units/mL (25000 units/244mL sodium chloride 0.45%) (1,200 Units/hr Intravenous New Bag/Given 10/04/16 1409)  nitroGLYCERIN 50 mg in dextrose 5 % 250 mL (0.2 mg/mL) infusion (not administered)  furosemide (LASIX) injection 80 mg (80 mg Intravenous Given 10/04/16 1406)  aspirin chewable tablet 324 mg (324 mg Oral Given 10/04/16 1406)   heparin bolus via infusion 4,000 Units (4,000 Units Intravenous Bolus from Bag 10/04/16 1410)     Initial Impression / Assessment and Plan / ED Course  I have reviewed the triage vital signs and the nursing notes.  Pertinent labs & imaging results that were available during my care of the patient were reviewed by me and considered in my medical decision making (see chart for details).     PT w/ 1 week intermittent chest pain associated w/ SOB, leg swelling, weight gain, orthopnea. No respiratory distress on exam, vital signs stable. He did have evidence of volume overload. His EKG did have a right bundle-branch block without any recent to compare to, he gets his care usually from the [facility]. His initial troponin came back elevated at 13.his creatinine is elevated at 2.93 with BNP 943. Chest x-ray shows left pleural effusion and vascular congestion. I suspect that he is having an acute CHF exacerbation and thus have given him Lasix as well as nitroglycerin. Because of the extremely elevated troponin and his ongoing intermittent symptoms, I did also start a nitroglycerin drip. I considered PE but feel this is less likely given his evidence of volume overload. Contacted cardiology service and they will admit for further care.  Final Clinical Impressions(s) / ED Diagnoses   Final diagnoses:  None    New Prescriptions New Prescriptions   No medications on file     Siddalee Vanderheiden, Wenda Overland, MD 10/04/16 1723

## 2016-10-04 NOTE — Progress Notes (Signed)
  Echocardiogram 2D Echocardiogram with Definity has been performed.  Tresa Res 10/04/2016, 4:09 PM

## 2016-10-05 ENCOUNTER — Encounter (HOSPITAL_COMMUNITY): Payer: Self-pay | Admitting: General Practice

## 2016-10-05 DIAGNOSIS — N179 Acute kidney failure, unspecified: Secondary | ICD-10-CM

## 2016-10-05 DIAGNOSIS — N183 Chronic kidney disease, stage 3 (moderate): Secondary | ICD-10-CM

## 2016-10-05 DIAGNOSIS — I255 Ischemic cardiomyopathy: Secondary | ICD-10-CM

## 2016-10-05 DIAGNOSIS — I5021 Acute systolic (congestive) heart failure: Secondary | ICD-10-CM

## 2016-10-05 LAB — GLUCOSE, CAPILLARY
GLUCOSE-CAPILLARY: 166 mg/dL — AB (ref 65–99)
GLUCOSE-CAPILLARY: 354 mg/dL — AB (ref 65–99)
Glucose-Capillary: 296 mg/dL — ABNORMAL HIGH (ref 65–99)
Glucose-Capillary: 347 mg/dL — ABNORMAL HIGH (ref 65–99)

## 2016-10-05 LAB — BASIC METABOLIC PANEL
Anion gap: 8 (ref 5–15)
BUN: 58 mg/dL — AB (ref 6–20)
CHLORIDE: 106 mmol/L (ref 101–111)
CO2: 23 mmol/L (ref 22–32)
Calcium: 8.6 mg/dL — ABNORMAL LOW (ref 8.9–10.3)
Creatinine, Ser: 3.06 mg/dL — ABNORMAL HIGH (ref 0.61–1.24)
GFR calc Af Amer: 22 mL/min — ABNORMAL LOW (ref >60)
GFR calc non Af Amer: 19 mL/min — ABNORMAL LOW (ref >60)
GLUCOSE: 194 mg/dL — AB (ref 65–99)
Potassium: 3.7 mmol/L (ref 3.5–5.1)
Sodium: 137 mmol/L (ref 135–145)

## 2016-10-05 LAB — TROPONIN I: Troponin I: 21.47 ng/mL (ref <0.03)

## 2016-10-05 LAB — CBC
HCT: 32.6 % — ABNORMAL LOW (ref 39.0–52.0)
HEMOGLOBIN: 10.2 g/dL — AB (ref 13.0–17.0)
MCH: 28.7 pg (ref 26.0–34.0)
MCHC: 31.3 g/dL (ref 30.0–36.0)
MCV: 91.6 fL (ref 78.0–100.0)
Platelets: 265 10*3/uL (ref 150–400)
RBC: 3.56 MIL/uL — ABNORMAL LOW (ref 4.22–5.81)
RDW: 14.1 % (ref 11.5–15.5)
WBC: 10.4 10*3/uL (ref 4.0–10.5)

## 2016-10-05 LAB — HEMOGLOBIN A1C
Hgb A1c MFr Bld: 7.6 % — ABNORMAL HIGH (ref 4.8–5.6)
Mean Plasma Glucose: 171.42 mg/dL

## 2016-10-05 LAB — HEPARIN LEVEL (UNFRACTIONATED)
HEPARIN UNFRACTIONATED: 0.13 [IU]/mL — AB (ref 0.30–0.70)
HEPARIN UNFRACTIONATED: 0.32 [IU]/mL (ref 0.30–0.70)

## 2016-10-05 LAB — LIPID PANEL
CHOL/HDL RATIO: 3 ratio
Cholesterol: 144 mg/dL (ref 0–200)
HDL: 48 mg/dL (ref >40)
LDL CALC: 73 mg/dL (ref 0–99)
Triglycerides: 117 mg/dL (ref <150)
VLDL: 23 mg/dL (ref 0–40)

## 2016-10-05 MED ORDER — CARVEDILOL 3.125 MG PO TABS
3.1250 mg | ORAL_TABLET | Freq: Two times a day (BID) | ORAL | Status: DC
  Administered 2016-10-05 – 2016-10-18 (×25): 3.125 mg via ORAL
  Filled 2016-10-05 (×25): qty 1

## 2016-10-05 MED ORDER — FUROSEMIDE 10 MG/ML IJ SOLN
80.0000 mg | Freq: Three times a day (TID) | INTRAMUSCULAR | Status: DC
  Administered 2016-10-05 – 2016-10-06 (×5): 80 mg via INTRAVENOUS
  Filled 2016-10-05 (×5): qty 8

## 2016-10-05 NOTE — Progress Notes (Signed)
ANTICOAGULATION CONSULT NOTE - Follow Up Consult  Pharmacy Consult for Heparin Indication: chest pain/ACS  Allergies  Allergen Reactions  . Lisinopril     Hyperkalemia   . Losartan     Hyperkalemia     Patient Measurements: Height: 5\' 11"  (180.3 cm) Weight: 259 lb 8 oz (117.7 kg) IBW/kg (Calculated) : 75.3 Heparin Dosing Weight: 100kg  Vital Signs: Temp: 98.2 F (36.8 C) (09/11 0800) Temp Source: Oral (09/11 0800) BP: 134/83 (09/11 0800) Pulse Rate: 75 (09/11 0800)  Labs:  Recent Labs  10/04/16 1210 10/04/16 1625 10/04/16 2155 10/05/16 0218 10/05/16 1121  HGB 11.3*  --   --  10.2*  --   HCT 36.0*  --   --  32.6*  --   PLT 294  --   --  265  --   HEPARINUNFRC  --   --  1.58* 0.13* 0.32  CREATININE 2.93*  --   --  3.06*  --   TROPONINI  --  22.55* 30.58* 21.47*  --     Estimated Creatinine Clearance: 28.1 mL/min (A) (by C-G formula based on SCr of 3.06 mg/dL (H)).   Medications:  Heparin @ 1200 units/hr  Assessment: 71yom continues on heparin for NSTEMI. Heparin level is therapeutic at 0.32. CBC stable. No bleeding.  Goal of Therapy:  Heparin level 0.3-0.7 units/ml Monitor platelets by anticoagulation protocol: Yes   Plan:  1) Continue heparin at 1200 units/hr 2) Daily heparin level and CBC  Deboraha Sprang 10/05/2016,12:23 PM

## 2016-10-05 NOTE — Consult Note (Signed)
[facility] CM Primary Care Navigator  10/05/2016  Mitchell Garcia [birth year] [id number]   Met with patient and wife [name]) at the bedsideto identify possible discharge needs.  Wife reports that patient was sluggish, weak with complaints of shortness of breath (especially when lying down) and has generalized edema that had led to this admission.  Patient's wife confirmed thathis primary care provider is Dr. Romero Liner with [facility]- which is not under [facility] network. Wife states that patient has not seen Dr. Bluford Kaufmann with [place] [facility] [place] for several years now.   Anticipated discharge plan is home according to patient and wife. Still awaiting PT/ OT recommendations and physician order.  Patient was encouraged to follow-up with primary care providerafter hospital discharge or when he returns home and he was agreeable with it.   For questions, please contact:  Dannielle Huh, BSN, RN- [facility] Primary Care Navigator  Telephone: [phone] [place]

## 2016-10-05 NOTE — Progress Notes (Signed)
Progress Note  Patient Name: Mitchell Garcia Date of Encounter: 10/05/2016  Primary Cardiologist: Angelena Form  Subjective   No chest pain. Dyspnea improved.   Inpatient Medications    Scheduled Meds: . aspirin  324 mg Oral NOW   Or  . aspirin  300 mg Rectal NOW  . aspirin EC  81 mg Oral Daily  . atorvastatin  80 mg Oral QHS  . furosemide  80 mg Intravenous Daily  . gabapentin  300 mg Oral BID  . insulin aspart  0-5 Units Subcutaneous QHS  . insulin aspart  0-9 Units Subcutaneous TID WC  . primidone  100 mg Oral Daily   Continuous Infusions: . heparin 1,200 Units/hr (10/05/16 1035)  . nitroGLYCERIN 35 mcg/min (10/05/16 1035)   PRN Meds: acetaminophen, nitroGLYCERIN, ondansetron (ZOFRAN) IV   Vital Signs    Vitals:   10/05/16 0451 10/05/16 0600 10/05/16 0800 10/05/16 1230  BP:  (!) 113/55 134/83 (!) 151/72  Pulse:  (!) 59 75 90  Resp:  (!) 22 (!) 25 (!) 22  Temp: 98.3 F (36.8 C)  98.2 F (36.8 C) 97.9 F (36.6 C)  TempSrc:   Oral Oral  SpO2:  93% 94% 96%  Weight:      Height:        Intake/Output Summary (Last 24 hours) at 10/05/16 1422 Last data filed at 10/05/16 1306  Gross per 24 hour  Intake              600 ml  Output             1200 ml  Net             -600 ml   Filed Weights   10/04/16 1300 10/04/16 2259  Weight: 250 lb (113.4 kg) 259 lb 8 oz (117.7 kg)    Telemetry    sinus - Personally Reviewed  ECG      Physical Exam    General: Well developed, well nourished, NAD  HEENT: OP clear, mucus membranes moist  SKIN: warm, dry. No rashes. Neuro: No focal deficits  Musculoskeletal: Muscle strength 5/5 all ext  Psychiatric: Mood and affect normal  Neck: No JVD, no carotid bruits, no thyromegaly, no lymphadenopathy.  Lungs:basilar crackles,  Cardiovascular: Regular rate and rhythm. No murmurs, gallops or rubs. Abdomen:Soft. Bowel sounds present. Non-tender.  Extremities: 2+ bilateral lower extremity edema.     Labs     Chemistry Recent Labs Lab 10/04/16 1210 10/05/16 0218  NA 139 137  K 3.9 3.7  CL 108 106  CO2 23 23  GLUCOSE 140* 194*  BUN 50* 58*  CREATININE 2.93* 3.06*  CALCIUM 8.9 8.6*  GFRNONAA 20* 19*  GFRAA 23* 22*  ANIONGAP 8 8     Hematology Recent Labs Lab 10/04/16 1210 10/05/16 0218  WBC 12.5* 10.4  RBC 3.92* 3.56*  HGB 11.3* 10.2*  HCT 36.0* 32.6*  MCV 91.8 91.6  MCH 28.8 28.7  MCHC 31.4 31.3  RDW 14.2 14.1  PLT 294 265    Cardiac Enzymes Recent Labs Lab 10/04/16 1625 10/04/16 2155 10/05/16 0218  TROPONINI 22.55* 30.58* 21.47*    Recent Labs Lab 10/04/16 1229  TROPIPOC 13.35*     BNP Recent Labs Lab 10/04/16 1210  BNP 943.6*     DDimer No results for input(s): DDIMER in the last 168 hours.   Radiology    Dg Chest 2 View  Result Date: 10/04/2016 CLINICAL DATA:  Shortness of Breath EXAM: CHEST  2  VIEW COMPARISON:  January 25, 2013 FINDINGS: There is a small left pleural effusion. There is no edema or consolidation. Heart is borderline enlarged with pulmonary venous hypertension. No adenopathy. There is aortic atherosclerosis. No bone lesions. IMPRESSION: Pulmonary vascular congestion. Small left pleural effusion. No edema or consolidation. There is aortic atherosclerosis. Aortic Atherosclerosis (ICD10-I70.0). Electronically Signed   By: Lowella Grip III M.D.   On: 10/04/2016 15:16    Cardiac Studies   Echo 10/04/16: ventricle: Poor acoustic windows limit studly Defiinity   used. LVEF is approximately 35% with diffuse hypokinesis. The   cavity size was mildly dilated. Wall thickness was increased in a   pattern of mild LVH. Doppler parameters are consistent with   abnormal left ventricular relaxation (grade 1 diastolic   dysfunction). Doppler parameters are consistent with high   ventricular filling pressure.  Patient Profile     71 y.o. male with history of DM, HTN, CVA, HTN, chronic diastolic CHF admitted with dyspnea, CHF.   Assessment  & Plan    1. NSTEMI: Pt admitted with mild chest pain in setting of massive volume overload. Troponin peak 30.58. LV systolic function is reduced. Cardiac cath is indicated but I have delayed given his renal insufficiency. The family is aware of this. He is currently on IV heparin, IV NTG and ASA and is comfortable.   2. Ischemic cardiomyopathy: LVEF=35% by echo 10/04/16. Will add low dose beta blocker. Will continue IV NTG. Will not start ARB/Ace-inh given renal insufficiency.   3. Acute systolic CHF: He is not diuresing well with the current dose of IV Lasix. Will increase Lasix to 80 mg IV TID. I am hopeful that his creatinine will improve some with diuresis. Baseline creatinine was around 2.0 last year (records in paper chart). May need to involved Nephrology tomorrow to assist with renal failure in this patient who will need a cardiac cath.   4. Acute on chronic renal failure: Will continue diuresis today and follow renal function closely.   For questions or updates, please contact [place] Please consult [email or web address] for contact info under Cardiology/STEMI.      Signed, Lauree Chandler, MD  10/05/2016, 2:22 PM

## 2016-10-05 NOTE — Progress Notes (Signed)
Inpatient Diabetes Program Recommendations  AACE/ADA: New Consensus Statement on Inpatient Glycemic Control (2015)  Target Ranges:  Prepandial:   less than 140 mg/dL      Peak postprandial:   less than 180 mg/dL (1-2 hours)      Critically ill patients:  140 - 180 mg/dL   Lab Results  Component Value Date   GLUCAP 296 (H) 10/05/2016   HGBA1C 7.6 (H) 10/05/2016   Review of Glycemic Control  Diabetes history: DM 2 Outpatient Diabetes medications: 70/30 95 units BID Current orders for Inpatient glycemic control: Novolog Sensitive 0-9 units tid + Novolog HS scale  A1c 7.6% on 9/11  Inpatient Diabetes Program Recommendations:    Patient has basal component in insulin at home. Glucose trends elevated. If glucose remains elevated may consider low dose basal insulin while inpatient, Lantus 12 units.  Thanks,  Tama Headings RN, MSN, [facility] Inpatient Diabetes Coordinator Team Pager [phone] (8a-5p)

## 2016-10-05 NOTE — Care Management Note (Signed)
Case Management Note [name] RN, BSN Unit 4E-Case Manager [phone]  Patient Details  Name: Mitchell Garcia MRN: [id number] Date of Birth: [birth year]  Subjective/Objective:    Pt admitted with NSTEMI                Action/Plan: PTA pt lived at home- is followed by the [place] [facility] in [place].- CM will follow for d/c needs- pt pending cardiac cath  Expected Discharge Date:                  Expected Discharge Plan:     In-House Referral:     Discharge planning Services  CM Consult  Post Acute Care Choice:    Choice offered to:     DME Arranged:    DME Agency:     HH Arranged:    HH Agency:     Status of Service:  In process, will continue to follow  If discussed at Long Length of Stay Meetings, dates discussed:    Discharge Disposition:   Additional Comments:  Dawayne Patricia, RN 10/05/2016, 4:02 PM

## 2016-10-06 LAB — CBC
HCT: 31.3 % — ABNORMAL LOW (ref 39.0–52.0)
HCT: 30.5 % — ABNORMAL LOW (ref 39.0–52.0)
HEMOGLOBIN: 9.3 g/dL — AB (ref 13.0–17.0)
Hemoglobin: 10 g/dL — ABNORMAL LOW (ref 13.0–17.0)
MCH: 29.2 pg (ref 26.0–34.0)
MCH: 27.9 pg (ref 26.0–34.0)
MCHC: 31.9 g/dL (ref 30.0–36.0)
MCHC: 30.5 g/dL (ref 30.0–36.0)
MCV: 91.6 fL (ref 78.0–100.0)
MCV: 91.3 fL (ref 78.0–100.0)
PLATELETS: 270 10*3/uL (ref 150–400)
PLATELETS: 259 10*3/uL (ref 150–400)
RBC: 3.43 MIL/uL — ABNORMAL LOW (ref 4.22–5.81)
RBC: 3.33 MIL/uL — ABNORMAL LOW (ref 4.22–5.81)
RDW: 14.2 % (ref 11.5–15.5)
RDW: 14.1 % (ref 11.5–15.5)
WBC: 9 10*3/uL (ref 4.0–10.5)
WBC: 9.3 10*3/uL (ref 4.0–10.5)

## 2016-10-06 LAB — BASIC METABOLIC PANEL
Anion gap: 9 (ref 5–15)
BUN: 61 mg/dL — ABNORMAL HIGH (ref 6–20)
CALCIUM: 8.2 mg/dL — AB (ref 8.9–10.3)
CO2: 20 mmol/L — ABNORMAL LOW (ref 22–32)
CREATININE: 3.15 mg/dL — AB (ref 0.61–1.24)
Chloride: 106 mmol/L (ref 101–111)
GFR calc Af Amer: 21 mL/min — ABNORMAL LOW (ref >60)
GFR, EST NON AFRICAN AMERICAN: 18 mL/min — AB (ref >60)
Glucose, Bld: 211 mg/dL — ABNORMAL HIGH (ref 65–99)
POTASSIUM: 4.1 mmol/L (ref 3.5–5.1)
SODIUM: 135 mmol/L (ref 135–145)

## 2016-10-06 LAB — HEPARIN LEVEL (UNFRACTIONATED)
HEPARIN UNFRACTIONATED: 0.29 [IU]/mL — AB (ref 0.30–0.70)
HEPARIN UNFRACTIONATED: 0.26 [IU]/mL — AB (ref 0.30–0.70)
HEPARIN UNFRACTIONATED: 0.25 [IU]/mL — AB (ref 0.30–0.70)

## 2016-10-06 LAB — GLUCOSE, CAPILLARY: Glucose-Capillary: 358 mg/dL — ABNORMAL HIGH (ref 65–99)

## 2016-10-06 MED ORDER — HEPARIN (PORCINE) IN NACL 100-0.45 UNIT/ML-% IJ SOLN
1350.0000 [IU]/h | INTRAMUSCULAR | Status: DC
  Filled 2016-10-06: qty 250

## 2016-10-06 MED ORDER — HEPARIN (PORCINE) IN NACL 100-0.45 UNIT/ML-% IJ SOLN
1450.0000 [IU]/h | INTRAMUSCULAR | Status: DC
  Administered 2016-10-06 – 2016-10-08 (×2): 1450 [IU]/h via INTRAVENOUS
  Filled 2016-10-06 (×2): qty 250

## 2016-10-06 MED ORDER — INFLUENZA VAC SPLIT HIGH-DOSE 0.5 ML IM SUSY
0.5000 mL | PREFILLED_SYRINGE | INTRAMUSCULAR | Status: AC
  Administered 2016-10-09: 0.5 mL via INTRAMUSCULAR
  Filled 2016-10-06: qty 0.5

## 2016-10-06 MED ORDER — INSULIN GLARGINE 100 UNIT/ML ~~LOC~~ SOLN
12.0000 [IU] | Freq: Every day | SUBCUTANEOUS | Status: DC
  Administered 2016-10-06 – 2016-10-07 (×2): 12 [IU] via SUBCUTANEOUS
  Filled 2016-10-06 (×2): qty 0.12

## 2016-10-06 NOTE — Progress Notes (Addendum)
Progress Note  Patient Name: Mitchell Garcia Date of Encounter: 10/06/2016  Primary Cardiologist: Angelena Form  Subjective   Feeling much better. Breathing significantly improved.   Inpatient Medications    Scheduled Meds: . aspirin EC  81 mg Oral Daily  . atorvastatin  80 mg Oral QHS  . carvedilol  3.125 mg Oral BID WC  . furosemide  80 mg Intravenous TID  . gabapentin  300 mg Oral BID  . insulin aspart  0-5 Units Subcutaneous QHS  . insulin aspart  0-9 Units Subcutaneous TID WC  . primidone  100 mg Oral Daily   Continuous Infusions: . heparin 1,350 Units/hr (10/06/16 0405)  . nitroGLYCERIN 10 mcg/min (10/05/16 1718)   PRN Meds: acetaminophen, nitroGLYCERIN, ondansetron (ZOFRAN) IV   Vital Signs    Vitals:   10/05/16 2004 10/06/16 0017 10/06/16 0442 10/06/16 0753  BP:  130/62 (!) 112/59 129/69  Pulse: 73 (!) 58 67 80  Resp: (!) 21 (!) 25 20 20   Temp:  97.8 F (36.6 C) 97.9 F (36.6 C) 98.1 F (36.7 C)  TempSrc:  Oral Oral Oral  SpO2: 95% 99% 95% 98%  Weight:    258 lb 11.2 oz (117.3 kg)  Height:        Intake/Output Summary (Last 24 hours) at 10/06/16 0820 Last data filed at 10/06/16 0530  Gross per 24 hour  Intake              960 ml  Output             2100 ml  Net            -1140 ml   Filed Weights   10/04/16 1300 10/04/16 2259 10/06/16 0753  Weight: 250 lb (113.4 kg) 259 lb 8 oz (117.7 kg) 258 lb 11.2 oz (117.3 kg)    Telemetry    SR with PVCs - Personally Reviewed  ECG    SR, with 1st degree AVB, RBBB - Personally Reviewed  Physical Exam   General: Well developed, well nourished, male appearing in no acute distress. Head: Normocephalic, atraumatic.  Neck: Supple without bruits, JVD. Lungs:  Resp regular and unlabored, Diminished with expiratory wheezing. Heart: RRR, S1, S2, no S3, S4, or murmur; no rub. Abdomen: Soft, non-tender, non-distended with normoactive bowel sounds. No hepatomegaly. No rebound/guarding. No obvious abdominal  masses. Extremities: No clubbing, cyanosis, 2+ pitting LE edema (improving). Distal pedal pulses are 2+ bilaterally. Neuro: Alert and oriented X 3. Moves all extremities spontaneously. Psych: Normal affect.  Labs    Chemistry Recent Labs Lab 10/04/16 1210 10/05/16 0218 10/06/16 0242  NA 139 137 135  K 3.9 3.7 4.1  CL 108 106 106  CO2 23 23 20*  GLUCOSE 140* 194* 211*  BUN 50* 58* 61*  CREATININE 2.93* 3.06* 3.15*  CALCIUM 8.9 8.6* 8.2*  GFRNONAA 20* 19* 18*  GFRAA 23* 22* 21*  ANIONGAP 8 8 9      Hematology Recent Labs Lab 10/05/16 0218 10/06/16 0025 10/06/16 0242  WBC 10.4 9.3 9.0  RBC 3.56* 3.43* 3.33*  HGB 10.2* 10.0* 9.3*  HCT 32.6* 31.3* 30.5*  MCV 91.6 91.3 91.6  MCH 28.7 29.2 27.9  MCHC 31.3 31.9 30.5  RDW 14.1 14.1 14.2  PLT 265 270 259    Cardiac Enzymes Recent Labs Lab 10/04/16 1625 10/04/16 2155 10/05/16 0218  TROPONINI 22.55* 30.58* 21.47*    Recent Labs Lab 10/04/16 1229  TROPIPOC 13.35*     BNP Recent Labs Lab 10/04/16  1210  BNP 943.6*     DDimer No results for input(s): DDIMER in the last 168 hours.    Radiology    Dg Chest 2 View  Result Date: 10/04/2016 CLINICAL DATA:  Shortness of Breath EXAM: CHEST  2 VIEW COMPARISON:  January 25, 2013 FINDINGS: There is a small left pleural effusion. There is no edema or consolidation. Heart is borderline enlarged with pulmonary venous hypertension. No adenopathy. There is aortic atherosclerosis. No bone lesions. IMPRESSION: Pulmonary vascular congestion. Small left pleural effusion. No edema or consolidation. There is aortic atherosclerosis. Aortic Atherosclerosis (ICD10-I70.0). Electronically Signed   By: Lowella Grip III M.D.   On: 10/04/2016 15:16    Cardiac Studies   Echo: 10/04/16  Study Conclusions  - Left ventricle: Poor acoustic windows limit studly Defiinity   used. LVEF is approximately 35% with diffuse hypokinesis. The   cavity size was mildly dilated. Wall thickness  was increased in a   pattern of mild LVH. Doppler parameters are consistent with   abnormal left ventricular relaxation (grade 1 diastolic   dysfunction). Doppler parameters are consistent with high   ventricular filling pressure.  Patient Profile     71 y.o. male with history of DM, HTN, CVA, HTN, chronic diastolic CHF admitted with dyspnea, CHF.    Assessment & Plan    1. NSTEMI: Pt admitted with mild chest pain in setting of massive volume overload. Troponin peak 30.58. LV systolic function is reduced at 35%. Cardiac cath is indicated but deferred at this time given his renal insufficiency.  -- remains on IV heparin, IV NTG and ASA. No further chest pain this admission.   2. Ischemic cardiomyopathy: LVEF=35% by echo 10/04/16. Low dose beta blocker added yesterday and tolerating. -- Will continue IV NTG. No ARB/Ace-inh given renal insufficiency.   3. Acute systolic CHF: Improved diuresis with increase in lasix yesterday. Noted increase in Cr 3.0>>3.15 as expected. Baseline creatinine was around 2.0 last year (records in paper chart).  -- follow BMET -- consider nephrology consult  4. Acute on chronic renal failure: Improved UOP yesterday. Net neg 1L, with weight beginning to trend down.   5. Chronic Anemia: Unknown etiology. This may be due to his renal insufficency. Hgb 10>>9.3. No reports of bleeding -- follow CBC as he is on IV heparin  6. DM: Hgb A1c 7.6 -- SSI   Signed, [name], NP  10/06/2016, 8:20 AM  Pager # [phone]   For questions or updates, please contact [place] Please consult [email or web address] for contact info under Cardiology/STEMI. Daytime calls, contact the Day Call APP (6a-8a) or assigned team (Teams A-D) provider (7:30a - 5p). All other daytime calls (7:30-5p), contact the Card Master @ [phone].   Nighttime calls, contact the assigned APP (5p-8p) or MD (6:30p-8p). Overnight calls (8p-6a), contact the on call Fellow @ [phone].  I  have personally seen and examined this patient with [name], NP. I agree with the assessment and plan as outlined above. He is admitted with acute CHF, NSTEMI with chest pain and dyspnea. Dyspnea resolved with diuresis. He still has volume overload. Troponin peak of 30 and trending down. No further chest pain. Cardiac cath is indicated but delayed due to renal dysfunction.  My exam shows a WDWN male in NAD. CV:RRR. Lungs: clear bilaterally. Ext: 1 + bilateral LE edema.  Labs reviewed. Creatinine is trending up slightly but overall stable.  Cardiac cath is indicated given finding of new LV systolic dysfunction in the setting of elevated  troponin and acute CHF. He is feeling better symptomatically with mild diuresis. I think he is still volume overloaded. Will continue IV Lasix. Will ask Nephrology to see him today to help guide management of his CKD with need for diuresis and cardiac cath.   Lauree Chandler 10/06/2016 10:16 AM

## 2016-10-06 NOTE — Progress Notes (Addendum)
ANTICOAGULATION CONSULT NOTE - FOLLOW UP    HL = 0.29 (goal 0.3 - 0.7 units/mL) Heparin dosing weight = 100 kg   Assessment: 71 YOF continues on IV heparin for NSTEMI.  Heparin level slightly sub-therapeutic.  No issue with heparin infusion nor bleeding per RN.   Plan: Increase heparin gtt to 1350 units/hr Check confirmatory heparin level    Mitchell Garcia D. Mina Marble, PharmD, BCPS 10/06/2016, 3:55 AM

## 2016-10-06 NOTE — Progress Notes (Addendum)
ANTICOAGULATION CONSULT NOTE - FOLLOW UP    HL = 0.25 (goal 0.3 - 0.7 units/mL) Heparin dosing weight = 100 kg   Assessment: 71 YOM on IV heparin for NSTEMI.  Heparin was off around 1845 d/t loss of IV access and heparin was then resumed at 1927 per charting.  Heparin level is sub-therapeutic as expected given interruption.   Plan: Continue heparin gtt at 1450 units/hr F/U AM labs   Mitchell Garcia D. Mina Marble, PharmD, BCPS 10/06/2016, 11:10 PM    ============================   Addendum: AM labs:  HL = 0.35, therapeutic No change to heparin rate    Mitchell Garcia D. Mina Marble, PharmD, BCPS Pager:  [phone] 10/07/2016, 4:38 AM

## 2016-10-06 NOTE — Progress Notes (Signed)
ANTICOAGULATION CONSULT NOTE - Follow Up Consult  Pharmacy Consult for Heparin Indication: chest pain/ACS  Allergies  Allergen Reactions  . Lisinopril     Hyperkalemia   . Losartan     Hyperkalemia     Patient Measurements: Height: 5\' 11"  (180.3 cm) Weight: 258 lb 11.2 oz (117.3 kg) IBW/kg (Calculated) : 75.3 Heparin Dosing Weight: 100kg  Vital Signs: Temp: 97.8 F (36.6 C) (09/12 1112) Temp Source: Oral (09/12 1112) BP: 132/62 (09/12 1112) Pulse Rate: 56 (09/12 1112)  Labs:  Recent Labs  10/04/16 1210 10/04/16 1625  10/04/16 2155 10/05/16 0218 10/05/16 1121 10/06/16 0025 10/06/16 0242 10/06/16 1141  HGB 11.3*  --   --   --  10.2*  --  10.0* 9.3*  --   HCT 36.0*  --   --   --  32.6*  --  31.3* 30.5*  --   PLT 294  --   --   --  265  --  270 259  --   HEPARINUNFRC  --   --   < > 1.58* 0.13* 0.32  --  0.29* 0.26*  CREATININE 2.93*  --   --   --  3.06*  --   --  3.15*  --   TROPONINI  --  22.55*  --  30.58* 21.47*  --   --   --   --   < > = values in this interval not displayed.  Estimated Creatinine Clearance: 27.2 mL/min (A) (by C-G formula based on SCr of 3.15 mg/dL (H)).   Medications:  Heparin @ 1350 units/hr  Assessment: Mitchell Garcia continues on heparin for NSTEMI with plan for cath once renal function improved. Heparin level is slightly below goal at 0.26. RN says that the IV may have been disconnected for a few minutes which probably explains the low level despite rate increase earlier today. CBC remains stable. No bleeding.   Goal of Therapy:  Heparin level 0.3-0.7 units/ml Monitor platelets by anticoagulation protocol: Yes   Plan:  1) Increase heparin to 1450 units/hr 2) Check 8 hour heparin level  Deboraha Sprang 10/06/2016,1:54 PM

## 2016-10-06 NOTE — Consult Note (Signed)
[facility] Consult Note     Date: 10/06/2016                  Patient Name:  Mitchell Garcia  MRN: [id number]  DOB: 1945-09-02  Age / Sex: 71 y.o., male         PCP: Clinic, Mitchell [facility]                 Service Requesting Consult: Cardiology, Dr. Angelena Garcia                 Reason for Consult: Acute on chronic kidney disease             Chief Complaint: SOB  HPI: Pt is a 43M with a PMH significant for HTN, HLD, DM II, CKD, and dCHF who presented as a transfer from [facility] for evaluation of progressive dyspnea.  Briefly, pt was experiencing progressive dyspnea for 3 weeks which didn't respond to OTC treatments for colds.  He went to his PCP who transferred him to [facility] for workup.    He has been found to have new systolic dysfunction with EF 35% with diffuse hypokinesis.  Never has had a cath or stress test.    He is being diuresed with Lasix.  Baseline creatinine appears to be somewhere around 2.  PCP is Dr. Romero Garcia at [facility].  Pt thinks he is being followed by a nephrologist but can't recall name  Cr has trended upwards since admission with diuresis and is now 3.06.  Making good urine.  Plans for cath.  C/s for minimizing the risk of CIN for cath.  Past Medical History:  Diagnosis Date  . Cancer ([place])   . CHF (congestive heart failure) ([place])   . Chronic kidney disease   . Diabetes mellitus with renal complications (HCC)    uncontrolled  . Hypertension   . MI, old    years ago  . Stroke [facility])     Past Surgical History:  Procedure Laterality Date  . SKIN CANCER EXCISION      Family History  Problem Relation Age of Onset  . Hyperlipidemia Father    Social History:  reports that he has never smoked. He has never used smokeless tobacco. He reports that he does not drink alcohol or use drugs.  Allergies:  Allergies  Allergen Reactions  . Lisinopril     Hyperkalemia   . Losartan     Hyperkalemia     Medications Prior  to Admission  Medication Sig Dispense Refill  . ALPRAZolam (XANAX) 0.5 MG tablet Take 1 tablet (0.5 mg total) by mouth at bedtime as needed for anxiety. 30 tablet 0  . amLODipine (NORVASC) 10 MG tablet Take 10 mg by mouth daily.    [name] aspirin EC 81 MG tablet Take 81 mg by mouth daily.    [name] atorvastatin (LIPITOR) 80 MG tablet Take 80 mg by mouth at bedtime.    . docusate sodium 100 MG CAPS Take 100 mg by mouth 2 (two) times daily. 10 capsule 0  . furosemide (LASIX) 80 MG tablet Take 1 tablet (80 mg total) by mouth daily. 30 tablet 1  . gabapentin (NEURONTIN) 300 MG capsule Take 300 mg by mouth 2 (two) times daily.    . insulin aspart protamine - aspart (NOVOLOG 70/30 MIX) (70-30) 100 UNIT/ML FlexPen Inject 95 Units into the skin 2 (two) times daily.    [name] lisinopril (PRINIVIL,ZESTRIL) 40 MG tablet Take 40 mg by mouth  daily.     . primidone (MYSOLINE) 50 MG tablet Take 100 mg by mouth daily.    . vitamin B-12 (CYANOCOBALAMIN) 500 MCG tablet Take 1,000 mcg by mouth daily.    . calcitRIOL (ROCALTROL) 0.25 MCG capsule Take 1 capsule (0.25 mcg total) by mouth daily. (Patient not taking: Reported on 10/04/2016) 30 capsule 1  . oxyCODONE-acetaminophen (PERCOCET/ROXICET) 5-325 MG per tablet Take 1-2 tablets by mouth every 6 (six) hours as needed for severe pain. (Patient not taking: Reported on 10/04/2016) 65 tablet 0  . sevelamer carbonate (RENVELA) 800 MG tablet Take 1 tablet (800 mg total) by mouth 3 (three) times daily with meals. (Patient not taking: Reported on 10/04/2016) 90 tablet 1  . traMADol (ULTRAM) 50 MG tablet Take 2 tablets (100 mg total) by mouth 2 (two) times daily. (Patient not taking: Reported on 10/04/2016) 30 tablet 1    Results for orders placed or performed during the hospital encounter of 10/04/16 (from the past 48 hour(s))  Basic metabolic panel     Status: Abnormal   Collection Time: 10/04/16 12:10 PM  Result Value Ref Range   Sodium 139 135 - 145 mmol/L   Potassium 3.9 3.5 - 5.1  mmol/L   Chloride 108 101 - 111 mmol/L   CO2 23 22 - 32 mmol/L   Glucose, Bld 140 (H) 65 - 99 mg/dL   BUN 50 (H) 6 - 20 mg/dL   Creatinine, Ser 2.93 (H) 0.61 - 1.24 mg/dL   Calcium 8.9 8.9 - 10.3 mg/dL   GFR calc non Af Amer 20 (L) >60 mL/min   GFR calc Af Amer 23 (L) >60 mL/min    Comment: (NOTE) The eGFR has been calculated using the CKD EPI equation. This calculation has not been validated in all clinical situations. eGFR's persistently <60 mL/min signify possible Chronic Kidney Disease.    Anion gap 8 5 - 15  CBC     Status: Abnormal   Collection Time: 10/04/16 12:10 PM  Result Value Ref Range   WBC 12.5 (H) 4.0 - 10.5 K/uL   RBC 3.92 (L) 4.22 - 5.81 MIL/uL   Hemoglobin 11.3 (L) 13.0 - 17.0 g/dL   HCT 36.0 (L) 39.0 - 52.0 %   MCV 91.8 78.0 - 100.0 fL   MCH 28.8 26.0 - 34.0 pg   MCHC 31.4 30.0 - 36.0 g/dL   RDW 14.2 11.5 - 15.5 %   Platelets 294 150 - 400 K/uL  Brain natriuretic peptide     Status: Abnormal   Collection Time: 10/04/16 12:10 PM  Result Value Ref Range   B Natriuretic Peptide 943.6 (H) 0.0 - 100.0 pg/mL  I-stat troponin, ED     Status: Abnormal   Collection Time: 10/04/16 12:29 PM  Result Value Ref Range   Troponin i, poc 13.35 (HH) 0.00 - 0.08 ng/mL   Comment NOTIFIED PHYSICIAN    Comment 3            Comment: Due to the release kinetics of cTnI, a negative result within the first hours of the onset of symptoms does not rule out myocardial infarction with certainty. If myocardial infarction is still suspected, repeat the test at appropriate intervals.   Troponin I     Status: Abnormal   Collection Time: 10/04/16  4:25 PM  Result Value Ref Range   Troponin I 22.55 (HH) <0.03 ng/mL    Comment: CRITICAL RESULT CALLED TO, READ BACK BY AND VERIFIED WITH: Berlinda Last RN [phone]  R   Heparin level (unfractionated)     Status: Abnormal   Collection Time: 10/04/16  9:55 PM  Result Value Ref Range   Heparin Unfractionated 1.58 (H) 0.30 - 0.70  IU/mL    Comment: RESULTS CONFIRMED BY MANUAL DILUTION        IF HEPARIN RESULTS ARE BELOW EXPECTED VALUES, AND PATIENT DOSAGE HAS BEEN CONFIRMED, SUGGEST FOLLOW UP TESTING OF ANTITHROMBIN III LEVELS.   Troponin I     Status: Abnormal   Collection Time: 10/04/16  9:55 PM  Result Value Ref Range   Troponin I 30.58 (HH) <0.03 ng/mL    Comment: CRITICAL VALUE NOTED.  VALUE IS CONSISTENT WITH PREVIOUSLY REPORTED AND CALLED VALUE.  Glucose, capillary     Status: Abnormal   Collection Time: 10/04/16 10:50 PM  Result Value Ref Range   Glucose-Capillary 250 (H) 65 - 99 mg/dL   Comment 1 Notify RN    Comment 2 Document in Chart   Troponin I     Status: Abnormal   Collection Time: 10/05/16  2:18 AM  Result Value Ref Range   Troponin I 21.47 (HH) <0.03 ng/mL    Comment: CRITICAL VALUE NOTED.  VALUE IS CONSISTENT WITH PREVIOUSLY REPORTED AND CALLED VALUE.  CBC     Status: Abnormal   Collection Time: 10/05/16  2:18 AM  Result Value Ref Range   WBC 10.4 4.0 - 10.5 K/uL   RBC 3.56 (L) 4.22 - 5.81 MIL/uL   Hemoglobin 10.2 (L) 13.0 - 17.0 g/dL   HCT 85.4 (L) 18.2 - 58.2 %   MCV 91.6 78.0 - 100.0 fL   MCH 28.7 26.0 - 34.0 pg   MCHC 31.3 30.0 - 36.0 g/dL   RDW 71.4 71.5 - 78.7 %   Platelets 265 150 - 400 K/uL  Heparin level (unfractionated)     Status: Abnormal   Collection Time: 10/05/16  2:18 AM  Result Value Ref Range   Heparin Unfractionated 0.13 (L) 0.30 - 0.70 IU/mL    Comment:        IF HEPARIN RESULTS ARE BELOW EXPECTED VALUES, AND PATIENT DOSAGE HAS BEEN CONFIRMED, SUGGEST FOLLOW UP TESTING OF ANTITHROMBIN III LEVELS.   Basic metabolic panel     Status: Abnormal   Collection Time: 10/05/16  2:18 AM  Result Value Ref Range   Sodium 137 135 - 145 mmol/L   Potassium 3.7 3.5 - 5.1 mmol/L   Chloride 106 101 - 111 mmol/L   CO2 23 22 - 32 mmol/L   Glucose, Bld 194 (H) 65 - 99 mg/dL   BUN 58 (H) 6 - 20 mg/dL   Creatinine, Ser 3.94 (H) 0.61 - 1.24 mg/dL   Calcium 8.6 (L) 8.9 -  10.3 mg/dL   GFR calc non Af Amer 19 (L) >60 mL/min   GFR calc Af Amer 22 (L) >60 mL/min    Comment: (NOTE) The eGFR has been calculated using the CKD EPI equation. This calculation has not been validated in all clinical situations. eGFR's persistently <60 mL/min signify possible Chronic Kidney Disease.    Anion gap 8 5 - 15  Hemoglobin A1c     Status: Abnormal   Collection Time: 10/05/16  2:18 AM  Result Value Ref Range   Hgb A1c MFr Bld 7.6 (H) 4.8 - 5.6 %    Comment: (NOTE) Pre diabetes:          5.7%-6.4% Diabetes:              >6.4% Glycemic  control for   <7.0% adults with diabetes    Mean Plasma Glucose 171.42 mg/dL  Lipid panel     Status: None   Collection Time: 10/05/16  2:18 AM  Result Value Ref Range   Cholesterol 144 0 - 200 mg/dL   Triglycerides 088 <685 mg/dL   HDL 48 >52 mg/dL   Total CHOL/HDL Ratio 3.0 RATIO   VLDL 23 0 - 40 mg/dL   LDL Cholesterol 73 0 - 99 mg/dL    Comment:        Total Cholesterol/HDL:CHD Risk Coronary Heart Disease Risk Table                     Men   Women  1/2 Average Risk   3.4   3.3  Average Risk       5.0   4.4  2 X Average Risk   9.6   7.1  3 X Average Risk  23.4   11.0        Use the calculated Patient Ratio above and the CHD Risk Table to determine the patient's CHD Risk.        ATP III CLASSIFICATION (LDL):  <100     mg/dL   Optimal  506-049  mg/dL   Near or Above                    Optimal  130-159  mg/dL   Borderline  331-991  mg/dL   High  >906     mg/dL   Very High   Glucose, capillary     Status: Abnormal   Collection Time: 10/05/16  6:25 AM  Result Value Ref Range   Glucose-Capillary 166 (H) 65 - 99 mg/dL   Comment 1 Notify RN    Comment 2 Document in Chart   Heparin level (unfractionated)     Status: None   Collection Time: 10/05/16 11:21 AM  Result Value Ref Range   Heparin Unfractionated 0.32 0.30 - 0.70 IU/mL    Comment:        IF HEPARIN RESULTS ARE BELOW EXPECTED VALUES, AND PATIENT DOSAGE HAS  BEEN CONFIRMED, SUGGEST FOLLOW UP TESTING OF ANTITHROMBIN III LEVELS.   Glucose, capillary     Status: Abnormal   Collection Time: 10/05/16 11:54 AM  Result Value Ref Range   Glucose-Capillary 296 (H) 65 - 99 mg/dL   Comment 1 Notify RN    Comment 2 Document in Chart   Glucose, capillary     Status: Abnormal   Collection Time: 10/05/16  5:02 PM  Result Value Ref Range   Glucose-Capillary 347 (H) 65 - 99 mg/dL   Comment 1 Notify RN   Glucose, capillary     Status: Abnormal   Collection Time: 10/05/16  8:42 PM  Result Value Ref Range   Glucose-Capillary 354 (H) 65 - 99 mg/dL  CBC     Status: Abnormal   Collection Time: 10/06/16 12:25 AM  Result Value Ref Range   WBC 9.3 4.0 - 10.5 K/uL   RBC 3.43 (L) 4.22 - 5.81 MIL/uL   Hemoglobin 10.0 (L) 13.0 - 17.0 g/dL   HCT 07.7 (L) 91.4 - 49.7 %   MCV 91.3 78.0 - 100.0 fL   MCH 29.2 26.0 - 34.0 pg   MCHC 31.9 30.0 - 36.0 g/dL   RDW 30.2 88.5 - 56.3 %   Platelets 270 150 - 400 K/uL  CBC     Status: Abnormal   Collection  Time: 10/06/16  2:42 AM  Result Value Ref Range   WBC 9.0 4.0 - 10.5 K/uL   RBC 3.33 (L) 4.22 - 5.81 MIL/uL   Hemoglobin 9.3 (L) 13.0 - 17.0 g/dL   HCT 24.6 (L) 99.7 - 80.2 %   MCV 91.6 78.0 - 100.0 fL   MCH 27.9 26.0 - 34.0 pg   MCHC 30.5 30.0 - 36.0 g/dL   RDW 08.9 10.0 - 26.2 %   Platelets 259 150 - 400 K/uL  Heparin level (unfractionated)     Status: Abnormal   Collection Time: 10/06/16  2:42 AM  Result Value Ref Range   Heparin Unfractionated 0.29 (L) 0.30 - 0.70 IU/mL    Comment:        IF HEPARIN RESULTS ARE BELOW EXPECTED VALUES, AND PATIENT DOSAGE HAS BEEN CONFIRMED, SUGGEST FOLLOW UP TESTING OF ANTITHROMBIN III LEVELS.   Basic metabolic panel     Status: Abnormal   Collection Time: 10/06/16  2:42 AM  Result Value Ref Range   Sodium 135 135 - 145 mmol/L   Potassium 4.1 3.5 - 5.1 mmol/L   Chloride 106 101 - 111 mmol/L   CO2 20 (L) 22 - 32 mmol/L   Glucose, Bld 211 (H) 65 - 99 mg/dL   BUN 61 (H)  6 - 20 mg/dL   Creatinine, Ser 8.54 (H) 0.61 - 1.24 mg/dL   Calcium 8.2 (L) 8.9 - 10.3 mg/dL   GFR calc non Af Amer 18 (L) >60 mL/min   GFR calc Af Amer 21 (L) >60 mL/min    Comment: (NOTE) The eGFR has been calculated using the CKD EPI equation. This calculation has not been validated in all clinical situations. eGFR's persistently <60 mL/min signify possible Chronic Kidney Disease.    Anion gap 9 5 - 15   Dg Chest 2 View  Result Date: 10/04/2016 CLINICAL DATA:  Shortness of Breath EXAM: CHEST  2 VIEW COMPARISON:  January 25, 2013 FINDINGS: There is a small left pleural effusion. There is no edema or consolidation. Heart is borderline enlarged with pulmonary venous hypertension. No adenopathy. There is aortic atherosclerosis. No bone lesions. IMPRESSION: Pulmonary vascular congestion. Small left pleural effusion. No edema or consolidation. There is aortic atherosclerosis. Aortic Atherosclerosis (ICD10-I70.0). Electronically Signed   By: Bretta Bang III M.D.   On: 10/04/2016 15:16    ROS: all other systems reviewed and are negative  Blood pressure 129/69, pulse 68, temperature 98.1 F (36.7 C), temperature source Oral, resp. rate 20, height 5\' 11"  (1.803 m), weight 117.3 kg (258 lb 11.2 oz), SpO2 98 %. Physical Exam   GEN obese, no distress HEENT EOMI, PERRL NECK + JVD PULM distant lung sounds, bibasilar crackles CV RRR no m/r/g ABD large pannus EXT 2-3+ pitting edema bilaterally NEURO AAO x 3, R-sided deficits  Assessment/Plan  1. Acute kidney injury superimposed on CKD Stage 3b-4: hemodynamically mediated in the setting of new sCHF and NSTEMI likely from coronary ischemia.  He needs a cath.  Pt, wife, and I discussed risks of CIN and worsening of AKI from contrast for heart cath.  We also discussed possibly needing dialysis, either temporarily or permanently, from the procedure.  Pt has accepted these risks and is willing to proceed.  Therefore, I recommend holding lasix the  evening before and the day of the heart cath.  He would likely not be able to tolerate 12 hours of pre and 6 hours of post-cath hydration so would recommend the 6 hours of post-cath  hydration (gently at no more than 75 mL/ hr) if possible.    2. Acute systolic CHF/NSTEMI: troponins peaked in the 30s.  On hep gtt and nitro gtt.  Needs cath, discussion as above in #1  3. DM II: A1c 7.6  4.  Ischemic cardiomyopathy: on BB, no ACEi/ARB due to h/o hyperkalemia and renal insufficiency.  Madelon Lips MD [facility] pgr [phone] 10/06/2016, 11:04 AM

## 2016-10-07 LAB — CBC
HEMATOCRIT: 29.3 % — AB (ref 39.0–52.0)
HEMOGLOBIN: 9.3 g/dL — AB (ref 13.0–17.0)
MCH: 28.9 pg (ref 26.0–34.0)
MCHC: 31.7 g/dL (ref 30.0–36.0)
MCV: 91 fL (ref 78.0–100.0)
Platelets: 261 10*3/uL (ref 150–400)
RBC: 3.22 MIL/uL — AB (ref 4.22–5.81)
RDW: 14 % (ref 11.5–15.5)
WBC: 10.7 10*3/uL — AB (ref 4.0–10.5)

## 2016-10-07 LAB — GLUCOSE, CAPILLARY
GLUCOSE-CAPILLARY: 241 mg/dL — AB (ref 65–99)
Glucose-Capillary: 347 mg/dL — ABNORMAL HIGH (ref 65–99)
Glucose-Capillary: 287 mg/dL — ABNORMAL HIGH (ref 65–99)

## 2016-10-07 LAB — BASIC METABOLIC PANEL
ANION GAP: 8 (ref 5–15)
BUN: 67 mg/dL — ABNORMAL HIGH (ref 6–20)
CO2: 23 mmol/L (ref 22–32)
CREATININE: 3.01 mg/dL — AB (ref 0.61–1.24)
Calcium: 8.3 mg/dL — ABNORMAL LOW (ref 8.9–10.3)
Chloride: 102 mmol/L (ref 101–111)
GFR calc non Af Amer: 19 mL/min — ABNORMAL LOW (ref >60)
GFR, EST AFRICAN AMERICAN: 22 mL/min — AB (ref >60)
Glucose, Bld: 238 mg/dL — ABNORMAL HIGH (ref 65–99)
Potassium: 4.2 mmol/L (ref 3.5–5.1)
SODIUM: 133 mmol/L — AB (ref 135–145)

## 2016-10-07 LAB — HEPARIN LEVEL (UNFRACTIONATED): Heparin Unfractionated: 0.35 IU/mL (ref 0.30–0.70)

## 2016-10-07 LAB — PROTIME-INR
INR: 1.03
PROTHROMBIN TIME: 13.4 s (ref 11.4–15.2)

## 2016-10-07 MED ORDER — FUROSEMIDE 10 MG/ML IJ SOLN
80.0000 mg | Freq: Once | INTRAMUSCULAR | Status: AC
  Administered 2016-10-07: 80 mg via INTRAVENOUS
  Filled 2016-10-07: qty 8

## 2016-10-07 MED ORDER — SODIUM CHLORIDE 0.9 % IV SOLN: 250.0000 mL | INTRAVENOUS | Status: DC | PRN

## 2016-10-07 MED ORDER — SODIUM CHLORIDE 0.9 % IV SOLN
INTRAVENOUS | Status: DC
  Administered 2016-10-08: 07:00:00 via INTRAVENOUS

## 2016-10-07 MED ORDER — SODIUM CHLORIDE 0.9% FLUSH: 3.0000 mL | INTRAVENOUS | Status: DC | PRN

## 2016-10-07 MED ORDER — SODIUM CHLORIDE 0.9% FLUSH
3.0000 mL | Freq: Two times a day (BID) | INTRAVENOUS | Status: DC
  Administered 2016-10-08: 3 mL via INTRAVENOUS

## 2016-10-07 NOTE — Progress Notes (Signed)
  [facility] Progress Note    Assessment/ Plan:   1. Acute kidney injury superimposed on CKD Stage 3b-4: hemodynamically mediated in the setting of new sCHF and NSTEMI likely from coronary ischemia.  He needs a cath.  Pt, wife, and I discussed risks of CIN and worsening of AKI from contrast for heart cath.  We also discussed possibly needing dialysis, either temporarily or permanently, from the procedure.  Pt has accepted these risks and is willing to proceed.  Therefore, I recommend holding lasix the evening before and the day of the heart cath.  He would likely not be able to tolerate 12 hours of pre and 6 hours of post-cath hydration so would recommend the 6 hours of post-cath hydration (gently at no more than 75 mL/ hr) if vol status allows.  We will follow closely.       2. Acute systolic CHF/NSTEMI: troponins peaked in the 30s.  On hep gtt and nitro gtt.  Needs cath, discussion as above in #1  3. DM II: A1c 7.6  4.  Ischemic cardiomyopathy: on BB, no ACEi/ARB due to h/o hyperkalemia and renal insufficiency.  Subjective:    Breathing improved.  For cath tomorrow.     Objective:   BP 137/66   Pulse 78   Temp 98.1 F (36.7 C) (Oral)   Resp (!) 24   Ht 5\' 11"  (1.803 m)   Wt 116.8 kg (257 lb 9.6 oz)   SpO2 97%   BMI 35.93 kg/m   Intake/Output Summary (Last 24 hours) at 10/07/16 1132 Last data filed at 10/07/16 0900  Gross per 24 hour  Intake           1032.5 ml  Output             1890 ml  Net           -857.5 ml   Weight change:   Physical Exam: GEN obese, no distress HEENT EOMI, PERRL NECK + JVD PULM distant lung sounds, bibasilar crackles still present, somewhat improved CV RRR no m/r/g ABD large pannus EXT 1-2+ pitting edema bilaterally, improved NEURO AAO x 3, R-sided deficits  Imaging: No results found.  Labs: BMET  Recent Labs Lab 10/04/16 1210 10/05/16 0218 10/06/16 0242 10/07/16 0350  NA 139 137 135 133*  K 3.9 3.7 4.1 4.2  CL  108 106 106 102  CO2 23 23 20* 23  GLUCOSE 140* 194* 211* 238*  BUN 50* 58* 61* 67*  CREATININE 2.93* 3.06* 3.15* 3.01*  CALCIUM 8.9 8.6* 8.2* 8.3*   CBC  Recent Labs Lab 10/05/16 0218 10/06/16 0025 10/06/16 0242 10/07/16 0350  WBC 10.4 9.3 9.0 10.7*  HGB 10.2* 10.0* 9.3* 9.3*  HCT 32.6* 31.3* 30.5* 29.3*  MCV 91.6 91.3 91.6 91.0  PLT 265 270 259 261    Medications:    . aspirin EC  81 mg Oral Daily  . atorvastatin  80 mg Oral QHS  . carvedilol  3.125 mg Oral BID WC  . gabapentin  300 mg Oral BID  . Influenza vac split quadrivalent PF  0.5 mL Intramuscular Tomorrow-1000  . insulin aspart  0-5 Units Subcutaneous QHS  . insulin aspart  0-9 Units Subcutaneous TID WC  . insulin glargine  12 Units Subcutaneous QHS  . primidone  100 mg Oral Daily      Madelon Lips MD [facility] pgr [phone] 10/07/2016, 11:32 AM

## 2016-10-07 NOTE — Progress Notes (Signed)
Inpatient Diabetes Program Recommendations  AACE/ADA: New Consensus Statement on Inpatient Glycemic Control (2015)  Target Ranges:  Prepandial:   less than 140 mg/dL      Peak postprandial:   less than 180 mg/dL (1-2 hours)      Critically ill patients:  140 - 180 mg/dL   Lab Results  Component Value Date   GLUCAP 241 (H) 10/07/2016   HGBA1C 7.6 (H) 10/05/2016   Review of Glycemic Control  Diabetes history: DM 2 Outpatient Diabetes medications: 70/30 95 units BID Current orders for Inpatient glycemic control: Lantus 12 units, Novolog Sensitive 0-9 units tid + Novolog HS scale  Inpatient Diabetes Program Recommendations:    Glucose trends elevated still after Lantus 12 units. May consider increasing Lantus to 20 units.  Thanks,  Tama Headings RN, MSN, [facility] Inpatient Diabetes Coordinator Team Pager [phone] (8a-5p)

## 2016-10-07 NOTE — Progress Notes (Signed)
Progress Note  Patient Name: Mitchell Garcia Date of Encounter: 10/07/2016  Primary Cardiologist: Angelena Form  Subjective   No chest pain or dyspnea.   Inpatient Medications    Scheduled Meds: . aspirin EC  81 mg Oral Daily  . atorvastatin  80 mg Oral QHS  . carvedilol  3.125 mg Oral BID WC  . furosemide  80 mg Intravenous TID  . gabapentin  300 mg Oral BID  . Influenza vac split quadrivalent PF  0.5 mL Intramuscular Tomorrow-1000  . insulin aspart  0-5 Units Subcutaneous QHS  . insulin aspart  0-9 Units Subcutaneous TID WC  . insulin glargine  12 Units Subcutaneous QHS  . primidone  100 mg Oral Daily   Continuous Infusions: . heparin 1,450 Units/hr (10/06/16 1927)  . nitroGLYCERIN 35 mcg/min (10/06/16 2239)   PRN Meds: acetaminophen, nitroGLYCERIN, ondansetron (ZOFRAN) IV   Vital Signs    Vitals:   10/06/16 2014 10/06/16 2356 10/07/16 0500 10/07/16 0506  BP: 138/69 (!) 150/77 128/61   Pulse: 70 74 (!) 53   Resp: 20 (!) 28 17   Temp: 98.7 F (37.1 C) 98.2 F (36.8 C) 98.2 F (36.8 C)   TempSrc: Oral Oral Oral   SpO2: 97% 93% 95%   Weight:    257 lb 9.6 oz (116.8 kg)  Height:        Intake/Output Summary (Last 24 hours) at 10/07/16 0734 Last data filed at 10/07/16 0500  Gross per 24 hour  Intake           1032.5 ml  Output             1890 ml  Net           -857.5 ml   Filed Weights   10/04/16 2259 10/06/16 0753 10/07/16 0506  Weight: 259 lb 8 oz (117.7 kg) 258 lb 11.2 oz (117.3 kg) 257 lb 9.6 oz (116.8 kg)    Telemetry    Sinus- Personally Reviewed  ECG      Physical Exam    General: Well developed, well nourished, NAD  HEENT: OP clear, mucus membranes moist  SKIN: warm, dry. No rashes. Neuro: No focal deficits  Musculoskeletal: Muscle strength 5/5 all ext  Psychiatric: Mood and affect normal  Neck: No JVD, no carotid bruits, no thyromegaly, no lymphadenopathy.  Lungs:Clear bilaterally, no wheezes, rhonci, crackles Cardiovascular:  Regular rate and rhythm. No murmurs, gallops or rubs. Abdomen:Soft. Bowel sounds present. Non-tender.  Extremities: 1+ pitting bilateral lower extremity edema.    Labs    Chemistry  Recent Labs Lab 10/05/16 0218 10/06/16 0242 10/07/16 0350  NA 137 135 133*  K 3.7 4.1 4.2  CL 106 106 102  CO2 23 20* 23  GLUCOSE 194* 211* 238*  BUN 58* 61* 67*  CREATININE 3.06* 3.15* 3.01*  CALCIUM 8.6* 8.2* 8.3*  GFRNONAA 19* 18* 19*  GFRAA 22* 21* 22*  ANIONGAP 8 9 8      Hematology  Recent Labs Lab 10/06/16 0025 10/06/16 0242 10/07/16 0350  WBC 9.3 9.0 10.7*  RBC 3.43* 3.33* 3.22*  HGB 10.0* 9.3* 9.3*  HCT 31.3* 30.5* 29.3*  MCV 91.3 91.6 91.0  MCH 29.2 27.9 28.9  MCHC 31.9 30.5 31.7  RDW 14.1 14.2 14.0  PLT 270 259 261    Cardiac Enzymes  Recent Labs Lab 10/04/16 1625 10/04/16 2155 10/05/16 0218  TROPONINI 22.55* 30.58* 21.47*     Recent Labs Lab 10/04/16 1229  TROPIPOC 13.35*  BNP  Recent Labs Lab 10/04/16 1210  BNP 943.6*     DDimer No results for input(s): DDIMER in the last 168 hours.    Radiology    No results found.  Cardiac Studies   Echo: 10/04/16  Study Conclusions  - Left ventricle: Poor acoustic windows limit studly Defiinity   used. LVEF is approximately 35% with diffuse hypokinesis. The   cavity size was mildly dilated. Wall thickness was increased in a   pattern of mild LVH. Doppler parameters are consistent with   abnormal left ventricular relaxation (grade 1 diastolic   dysfunction). Doppler parameters are consistent with high   ventricular filling pressure.  Patient Profile     71 y.o. male with history of DM, HTN, CVA, HTN, chronic diastolic CHF admitted with dyspnea, CHF.    Assessment & Plan    1. NSTEMI: Admitted with chest pain and dyspnea in setting of volume overload with peak troponin of 30.58. LVEF=35% on echo. I think cardiac cath is indicated. This has been delayed due to volume overload and renal  insufficiency. He has been symptom free on IV NTG and IV heparin. Nephrology has seen him and recommended that we proceed with cardiac cath knowing the risk of worsened renal function post cath is a real possibility. Will hold Lasix tonight and in am and proceed with cath tomorrow. Continue ASA, heparin, IV NTG, statin and beta blocker.    2. Ischemic cardiomyopathy: LEVF 35% by echo. He is on low dose beta blocker. No Ace-inh/ARB with CKD.   3. Acute systolic CHF: Volume status much improved with diuresis. IV Lasix this am then hold dose tonight for cath.   4. Acute on chronic renal failure: Appreciated Nephrology input. Will proceed with cardiac cath tomorrow.   5. Chronic Anemia: Unknown etiology. No active bleeding.   6. DM:  Continue SSI  Signed, Lauree Chandler, MD  10/07/2016, 7:34 AM

## 2016-10-08 ENCOUNTER — Encounter (HOSPITAL_COMMUNITY)
Admission: EM | Disposition: A | Discharge status: Home / Self Care | Payer: Self-pay | Source: Home / Self Care | Attending: Cardiovascular Disease

## 2016-10-08 ENCOUNTER — Encounter (HOSPITAL_COMMUNITY): Payer: Self-pay | Admitting: Internal Medicine

## 2016-10-08 ENCOUNTER — Other Ambulatory Visit: Payer: Self-pay | Admitting: *Deleted

## 2016-10-08 DIAGNOSIS — I2510 Atherosclerotic heart disease of native coronary artery without angina pectoris: Secondary | ICD-10-CM

## 2016-10-08 DIAGNOSIS — I25110 Atherosclerotic heart disease of native coronary artery with unstable angina pectoris: Secondary | ICD-10-CM

## 2016-10-08 DIAGNOSIS — I5041 Acute combined systolic (congestive) and diastolic (congestive) heart failure: Secondary | ICD-10-CM | POA: Insufficient documentation

## 2016-10-08 HISTORY — PX: LEFT HEART CATH AND CORONARY ANGIOGRAPHY: CATH118249

## 2016-10-08 LAB — BASIC METABOLIC PANEL
Anion gap: 7 (ref 5–15)
BUN: 69 mg/dL — ABNORMAL HIGH (ref 6–20)
CO2: 24 mmol/L (ref 22–32)
CREATININE: 2.97 mg/dL — AB (ref 0.61–1.24)
Calcium: 8.4 mg/dL — ABNORMAL LOW (ref 8.9–10.3)
Chloride: 102 mmol/L (ref 101–111)
GFR calc non Af Amer: 19 mL/min — ABNORMAL LOW (ref >60)
GFR, EST AFRICAN AMERICAN: 23 mL/min — AB (ref >60)
Glucose, Bld: 219 mg/dL — ABNORMAL HIGH (ref 65–99)
Potassium: 4.3 mmol/L (ref 3.5–5.1)
SODIUM: 133 mmol/L — AB (ref 135–145)

## 2016-10-08 LAB — GLUCOSE, CAPILLARY
Glucose-Capillary: 203 mg/dL — ABNORMAL HIGH (ref 65–99)
Glucose-Capillary: 167 mg/dL — ABNORMAL HIGH (ref 65–99)
Glucose-Capillary: 126 mg/dL — ABNORMAL HIGH (ref 65–99)
Glucose-Capillary: 307 mg/dL — ABNORMAL HIGH (ref 65–99)

## 2016-10-08 LAB — CBC
HEMATOCRIT: 28.6 % — AB (ref 39.0–52.0)
HEMOGLOBIN: 9.1 g/dL — AB (ref 13.0–17.0)
MCH: 28.7 pg (ref 26.0–34.0)
MCHC: 31.8 g/dL (ref 30.0–36.0)
MCV: 90.2 fL (ref 78.0–100.0)
Platelets: 278 10*3/uL (ref 150–400)
RBC: 3.17 MIL/uL — AB (ref 4.22–5.81)
RDW: 13.9 % (ref 11.5–15.5)
WBC: 10.1 10*3/uL (ref 4.0–10.5)

## 2016-10-08 LAB — HEPARIN LEVEL (UNFRACTIONATED): Heparin Unfractionated: 0.51 IU/mL (ref 0.30–0.70)

## 2016-10-08 SURGERY — LEFT HEART CATH AND CORONARY ANGIOGRAPHY
Anesthesia: LOCAL

## 2016-10-08 MED ORDER — LIDOCAINE HCL (PF) 1 % IJ SOLN
INTRAMUSCULAR | Status: AC
  Filled 2016-10-08: qty 30

## 2016-10-08 MED ORDER — FENTANYL CITRATE (PF) 100 MCG/2ML IJ SOLN
INTRAMUSCULAR | Status: DC | PRN
  Administered 2016-10-08: 25 ug via INTRAVENOUS

## 2016-10-08 MED ORDER — FENTANYL CITRATE (PF) 100 MCG/2ML IJ SOLN
INTRAMUSCULAR | Status: AC
  Filled 2016-10-08: qty 2

## 2016-10-08 MED ORDER — SODIUM CHLORIDE 0.9% FLUSH
3.0000 mL | Freq: Two times a day (BID) | INTRAVENOUS | Status: DC
  Administered 2016-10-08 – 2016-10-17 (×10): 3 mL via INTRAVENOUS

## 2016-10-08 MED ORDER — VERAPAMIL HCL 2.5 MG/ML IV SOLN
INTRAVENOUS | Status: DC | PRN
  Administered 2016-10-08: 10 mL via INTRA_ARTERIAL

## 2016-10-08 MED ORDER — MIDAZOLAM HCL 2 MG/2ML IJ SOLN
INTRAMUSCULAR | Status: DC | PRN
  Administered 2016-10-08: 1 mg via INTRAVENOUS

## 2016-10-08 MED ORDER — SODIUM CHLORIDE 0.9 % IV SOLN: 250.0000 mL | INTRAVENOUS | Status: DC | PRN

## 2016-10-08 MED ORDER — HEPARIN SODIUM (PORCINE) 1000 UNIT/ML IJ SOLN
INTRAMUSCULAR | Status: DC | PRN
  Administered 2016-10-08: 5000 [IU] via INTRAVENOUS

## 2016-10-08 MED ORDER — HEPARIN (PORCINE) IN NACL 2-0.9 UNIT/ML-% IJ SOLN
INTRAMUSCULAR | Status: AC
Start: 2016-10-08 — End: ?
  Filled 2016-10-08: qty 1000

## 2016-10-08 MED ORDER — IOPAMIDOL (ISOVUE-370) INJECTION 76%
INTRAVENOUS | Status: AC
  Filled 2016-10-08: qty 100

## 2016-10-08 MED ORDER — HEPARIN SODIUM (PORCINE) 1000 UNIT/ML IJ SOLN
INTRAMUSCULAR | Status: AC
  Filled 2016-10-08: qty 1

## 2016-10-08 MED ORDER — IOPAMIDOL (ISOVUE-370) INJECTION 76%
INTRAVENOUS | Status: DC | PRN
  Administered 2016-10-08: 40 mL via INTRA_ARTERIAL

## 2016-10-08 MED ORDER — LIDOCAINE HCL (PF) 1 % IJ SOLN
INTRAMUSCULAR | Status: DC | PRN
  Administered 2016-10-08: 2 mL

## 2016-10-08 MED ORDER — MIDAZOLAM HCL 2 MG/2ML IJ SOLN
INTRAMUSCULAR | Status: AC
  Filled 2016-10-08: qty 2

## 2016-10-08 MED ORDER — HEPARIN (PORCINE) IN NACL 100-0.45 UNIT/ML-% IJ SOLN
1450.0000 [IU]/h | INTRAMUSCULAR | Status: DC
  Administered 2016-10-08 – 2016-10-13 (×6): 1450 [IU]/h via INTRAVENOUS
  Filled 2016-10-08 (×6): qty 250

## 2016-10-08 MED ORDER — INSULIN GLARGINE 100 UNIT/ML ~~LOC~~ SOLN
20.0000 [IU] | Freq: Every day | SUBCUTANEOUS | Status: DC
  Administered 2016-10-08 – 2016-10-10 (×3): 20 [IU] via SUBCUTANEOUS
  Filled 2016-10-08 (×3): qty 0.2

## 2016-10-08 MED ORDER — HEPARIN (PORCINE) IN NACL 2-0.9 UNIT/ML-% IJ SOLN
INTRAMUSCULAR | Status: AC | PRN
  Administered 2016-10-08: 1000 mL

## 2016-10-08 MED ORDER — VERAPAMIL HCL 2.5 MG/ML IV SOLN
INTRAVENOUS | Status: AC
  Filled 2016-10-08: qty 2

## 2016-10-08 MED ORDER — SODIUM CHLORIDE 0.9% FLUSH
3.0000 mL | INTRAVENOUS | Status: DC | PRN
  Administered 2016-10-10: 3 mL via INTRAVENOUS
  Filled 2016-10-08: qty 3

## 2016-10-08 MED ORDER — FUROSEMIDE 10 MG/ML IJ SOLN
80.0000 mg | Freq: Three times a day (TID) | INTRAMUSCULAR | Status: AC
  Administered 2016-10-08 – 2016-10-12 (×14): 80 mg via INTRAVENOUS
  Filled 2016-10-08 (×15): qty 8

## 2016-10-08 SURGICAL SUPPLY — 10 items
CATH INFINITI 5FR MULTPACK ANG (CATHETERS) ×1 IMPLANT
DEVICE RAD COMP TR BAND LRG (VASCULAR PRODUCTS) ×1 IMPLANT
GLIDESHEATH SLEND SS 6F .021 (SHEATH) ×1 IMPLANT
GUIDEWIRE INQWIRE 1.5J.035X260 (WIRE) IMPLANT
HOVERMATT SINGLE USE (MISCELLANEOUS) ×1 IMPLANT
INQWIRE 1.5J .035X260CM (WIRE) ×2
KIT HEART LEFT (KITS) ×2 IMPLANT
PACK CARDIAC CATHETERIZATION (CUSTOM PROCEDURE TRAY) ×2 IMPLANT
TRANSDUCER W/STOPCOCK (MISCELLANEOUS) ×2 IMPLANT
TUBING CIL FLEX 10 FLL-RA (TUBING) ×2 IMPLANT

## 2016-10-08 NOTE — Progress Notes (Signed)
[place] for Heparin Indication: chest pain/ACS  Allergies  Allergen Reactions  . Lisinopril     Hyperkalemia   . Losartan     Hyperkalemia     Patient Measurements: Height: 5\' 11"  (180.3 cm) Weight: 256 lb 12.8 oz (116.5 kg) IBW/kg (Calculated) : 75.3 Heparin Dosing Weight: 100kg  Vital Signs: Temp Source: Oral (09/14 1245) BP: 137/66 (09/14 1630) Pulse Rate: 54 (09/14 1630)  Labs:  Recent Labs  10/06/16 0242  10/06/16 2203 10/07/16 0350 10/07/16 1635 10/08/16 0259  HGB 9.3*  --   --  9.3*  --  9.1*  HCT 30.5*  --   --  29.3*  --  28.6*  PLT 259  --   --  261  --  278  LABPROT  --   --   --   --  13.4  --   INR  --   --   --   --  1.03  --   HEPARINUNFRC 0.29*  < > 0.25* 0.35  --  0.51  CREATININE 3.15*  --   --  3.01*  --  2.97*  < > = values in this interval not displayed.  Estimated Creatinine Clearance: 28.8 mL/min (A) (by C-G formula based on SCr of 2.97 mg/dL (H)).   Medications:  Heparin @ 1450 units/hr  Assessment: 71 yo male to continue heparin s/p cath today. Cath showed multivessel disease, cardiology planning to get CABG consult. TR band was off today at 1700, will resume heparin at 1900.    Goal of Therapy:  Heparin level 0.3-0.7 units/ml Monitor platelets by anticoagulation protocol: Yes   Plan:  1) Resume heparin at 1450 units/hr at 1900 2) Check daily CBC and heparin levels while on heparin infusion    Hughes Better, PharmD, BCPS Clinical Pharmacist 10/08/2016 6:32 PM

## 2016-10-08 NOTE — Progress Notes (Signed)
[facility] Progress Note    Assessment/ Plan:   1. Acute kidney injury superimposed on CKD Stage 3b-4: hemodynamically mediated in the setting of new sCHF and NSTEMI likely from coronary ischemia.  He needs a cath.  Pt, wife, and I discussed risks of CIN and worsening of AKI from contrast for heart cath.  We also discussed possibly needing dialysis, either temporarily or permanently, from the procedure.  Pt has accepted these risks and is willing to proceed.  Lasix has been held since yesterday afternoon.  He will likely not be able to tolerate 12 hours of pre and 6 hours of post-cath hydration so would recommend the 6 hours of post-cath hydration (gently at no more than 75 mL/ hr) which I think his vol status would allow today as wt is stable.     2. Acute systolic CHF/NSTEMI: troponins peaked in the 30s.  On hep gtt and nitro gtt.  Cath today.  3. DM II: A1c 7.6  4.  Ischemic cardiomyopathy: on BB, no ACEi/ARB due to h/o hyperkalemia and renal insufficiency.  Subjective:    NPO for cath today.  Reports pain in L arm at site of heparin infiltration.   Objective:   BP 126/82 (BP Location: Right Arm)   Pulse 65   Temp 98.6 F (37 C) (Oral)   Resp (!) 22   Ht 5\' 11"  (1.803 m)   Wt 116.5 kg (256 lb 12.8 oz)   SpO2 93%   BMI 35.82 kg/m   Intake/Output Summary (Last 24 hours) at 10/08/16 1029 Last data filed at 10/08/16 0658  Gross per 24 hour  Intake          1450.85 ml  Output             1901 ml  Net          -450.15 ml   Weight change: -0.862 kg (-1 lb 14.4 oz)  Physical Exam: GEN obese, no distress HEENT EOMI, PERRL NECK JVD improved PULM distant lung sounds, bibasilar crackles still present, somewhat improved CV RRR no m/r/g ABD large pannus EXT 1-2+ pitting edema bilaterally, improved NEURO AAO x 3, R arm deficits MSK: L arm with antecubital infiltration extending up the medial aspect of the arm- no frank hematoma  Imaging: No results  found.  Labs: BMET  Recent Labs Lab 10/04/16 1210 10/05/16 0218 10/06/16 0242 10/07/16 0350 10/08/16 0259  NA 139 137 135 133* 133*  K 3.9 3.7 4.1 4.2 4.3  CL 108 106 106 102 102  CO2 23 23 20* 23 24  GLUCOSE 140* 194* 211* 238* 219*  BUN 50* 58* 61* 67* 69*  CREATININE 2.93* 3.06* 3.15* 3.01* 2.97*  CALCIUM 8.9 8.6* 8.2* 8.3* 8.4*   CBC  Recent Labs Lab 10/06/16 0025 10/06/16 0242 10/07/16 0350 10/08/16 0259  WBC 9.3 9.0 10.7* 10.1  HGB 10.0* 9.3* 9.3* 9.1*  HCT 31.3* 30.5* 29.3* 28.6*  MCV 91.3 91.6 91.0 90.2  PLT 270 259 261 278    Medications:    . aspirin EC  81 mg Oral Daily  . atorvastatin  80 mg Oral QHS  . carvedilol  3.125 mg Oral BID WC  . gabapentin  300 mg Oral BID  . Influenza vac split quadrivalent PF  0.5 mL Intramuscular Tomorrow-1000  . insulin aspart  0-5 Units Subcutaneous QHS  . insulin aspart  0-9 Units Subcutaneous TID WC  . insulin glargine  20 Units Subcutaneous QHS  . primidone  100  mg Oral Daily  . sodium chloride flush  3 mL Intravenous Q12H      Madelon Lips MD [facility] pgr [phone] 10/08/2016, 10:29 AM

## 2016-10-08 NOTE — Progress Notes (Signed)
Day of Surgery Procedure(s) (LRB): LEFT HEART CATH AND CORONARY ANGIOGRAPHY (N/A) Subjective: Patient examined, coronary angiogram and echocardiogram images personally reviewed 71 year old obese diabetic male chronically ill-appearing and fragile admitted with symptoms of CHF, troponin 35.2, non-ST elevation MI. He has noncritical LAD disease, chronic occlusion of the distal circumflex and high-grade ostial RCA stenosis. LVEDP is 34. Echo shows LVH, moderate to severe LV dysfunction, LV end-diastolic diameter 5.6 cm, no significant MR.  The patient has had a stroke with right-sided weakness. The patient has chronic renal failure with a creatinine 3. 7-4 0.2 over the past 3 years.  Objective: Vital signs in last 24 hours: Temp:  [97.8 F (36.6 C)-98.6 F (37 C)] 98.6 F (37 C) (09/14 0400) Pulse Rate:  [0-66] 54 (09/14 1630) Cardiac Rhythm: Heart block;Sinus bradycardia (09/14 0700) Resp:  [7-22] 16 (09/14 1500) BP: (101-162)/(58-82) 137/66 (09/14 1630) SpO2:  [0 %-100 %] 93 % (09/14 1630) Weight:  [256 lb 12.8 oz (116.5 kg)] 256 lb 12.8 oz (116.5 kg) (09/14 0500)  Hemodynamic parameters for last 24 hours:  sinus rhythm  Intake/Output from previous day: 09/13 0701 - 09/14 0700 In: 1690.9 [P.O.:720; I.V.:970.9] Out: 2151 [Urine:2150; Stool:1] Intake/Output this shift: No intake/output data recorded.      Physical Exam  General: Elderly obese Caucasian male short of breath at rest with some wheezing HEENT: Normocephalic pupils equal , dentition adequate Neck: Supple without JVD, adenopathy, or bruit Chest: Diminished breath sounds bilaterally with scattered rhonchi, no tenderness             or deformity Cardiovascular: Regular rate and rhythm, no murmur, no gallop, peripheral pulses             palpable in all extremities Abdomen:  Soft, obese, nontender, no palpable mass or organomegaly Extremities: Warm, well-perfused, no clubbing cyanosis  or tenderness, 2+ edema      2+ venous stasis changes of the legs Rectal/GU: Deferred Neuro: Grossly non--focal and symmetrical throughout Skin: Clean and dry without rash or ulceration   Lab Results:  Recent Labs  10/07/16 0350 10/08/16 0259  WBC 10.7* 10.1  HGB 9.3* 9.1*  HCT 29.3* 28.6*  PLT 261 278   BMET:  Recent Labs  10/07/16 0350 10/08/16 0259  NA 133* 133*  K 4.2 4.3  CL 102 102  CO2 23 24  GLUCOSE 238* 219*  BUN 67* 69*  CREATININE 3.01* 2.97*  CALCIUM 8.3* 8.4*    PT/INR:  Recent Labs  10/07/16 1635  LABPROT 13.4  INR 1.03   ABG No results found for: PHART, HCO3, TCO2, ACIDBASEDEF, O2SAT CBG (last 3)   Recent Labs  10/08/16 0646 10/08/16 1210 10/08/16 1617  GLUCAP 203* 167* 126*    Assessment/Plan: S/P Procedure(s) (LRB): LEFT HEART CATH AND CORONARY ANGIOGRAPHY (N/A) Fragile 71 year old obese male presents with symptoms of CHF after non-STEMI. He has 2 vessel CAD with moderate-severe LV dysfunction He has chronic renal failure and I am concerned regarding his functional status inability to tolerate and benefit from CABG.  We'll check ABG, PFTs and have cardiac rehabilitation phase 1 assess his ability to ambulate. Patient would probably benefit more from PCI.   LOS: 4 days    Tharon Aquas Trigt III 10/08/2016

## 2016-10-08 NOTE — Progress Notes (Signed)
Inpatient Diabetes Program Recommendations  AACE/ADA: New Consensus Statement on Inpatient Glycemic Control (2015)  Target Ranges:  Prepandial:   less than 140 mg/dL      Peak postprandial:   less than 180 mg/dL (1-2 hours)      Critically ill patients:  140 - 180 mg/dL   Lab Results  Component Value Date   GLUCAP 203 (H) 10/08/2016   HGBA1C 7.6 (H) 10/05/2016    Review of Glycemic Control Results for Mitchell Garcia, Mitchell Garcia (MRN [id number]) as of 10/08/2016 11:59  Ref. Range 10/06/2016 11:16 10/07/2016 06:22 10/07/2016 16:25 10/07/2016 20:34 10/08/2016 06:46  Glucose-Capillary Latest Ref Range: 65 - 99 mg/dL 358 (H) 241 (H) 347 (H) 287 (H) 203 (H)   Diabetes history: Type 2 diabetes Outpatient Diabetes medications: 70/30 95 units bid Current orders for Inpatient glycemic control:  Novolog sensitive tid with meals and HS, Lantus 20 units q HS  Inpatient Diabetes Program Recommendations:    Note patient NPO for cath.  Once eating, please consider adding Novolog meal coverage 4 units tid with meals (hold if patient eats less than 50%). Lantus increased to 20 units q HS.  If fasting CBG's continue to be greater than 150 mg/dL, consider further increase of Lantus to 30 units q HS.   Thanks,  Adah Perl, RN, BC-ADM Inpatient Diabetes Coordinator Pager [phone] (8a-5p)

## 2016-10-08 NOTE — Interval H&P Note (Signed)
History and Physical Interval Note:  10/08/2016 10:36 AM  Mitchell Garcia  has presented today for cardiac catheterization, with the diagnosis of NSTEMI and acute systolic heart failure. The various methods of treatment have been discussed with the patient and family. After consideration of risks, benefits and other options for treatment, the patient has consented to  Procedure(s): LEFT HEART CATH AND CORONARY ANGIOGRAPHY (N/A) as a surgical intervention .  The patient's history has been reviewed, patient examined, no change in status, stable for surgery.  I have reviewed the patient's chart and labs.  Questions were answered to the patient's satisfaction.    Cath Lab Visit (complete for each Cath Lab visit)  Clinical Evaluation Leading to the Procedure:   ACS: Yes.    Non-ACS: N/A   [name]

## 2016-10-08 NOTE — Progress Notes (Signed)
.cdm   Progress Note  Patient Name: Mitchell Garcia Date of Encounter: 10/08/2016  Primary Cardiologist: Angelena Form  Subjective   No chest pain or dyspnea   Inpatient Medications    Scheduled Meds: . aspirin EC  81 mg Oral Daily  . atorvastatin  80 mg Oral QHS  . carvedilol  3.125 mg Oral BID WC  . gabapentin  300 mg Oral BID  . Influenza vac split quadrivalent PF  0.5 mL Intramuscular Tomorrow-1000  . insulin aspart  0-5 Units Subcutaneous QHS  . insulin aspart  0-9 Units Subcutaneous TID WC  . insulin glargine  20 Units Subcutaneous QHS  . primidone  100 mg Oral Daily  . sodium chloride flush  3 mL Intravenous Q12H   Continuous Infusions: . sodium chloride    . sodium chloride 10 mL/hr at 10/08/16 0639  . heparin 1,450 Units/hr (10/08/16 0345)  . nitroGLYCERIN 35 mcg/min (10/07/16 1950)   PRN Meds: sodium chloride, acetaminophen, nitroGLYCERIN, ondansetron (ZOFRAN) IV, sodium chloride flush   Vital Signs    Vitals:   10/07/16 1955 10/08/16 0043 10/08/16 0400 10/08/16 0500  BP: (!) 162/75 (!) 145/70 126/82   Pulse: (!) 59 (!) 55 65   Resp: (!) 22  (!) 22   Temp: 97.8 F (36.6 C) 98.6 F (37 C) 98.6 F (37 C)   TempSrc: Oral Oral Oral   SpO2: 96% 94% 93%   Weight:    256 lb 12.8 oz (116.5 kg)  Height:        Intake/Output Summary (Last 24 hours) at 10/08/16 0733 Last data filed at 10/08/16 0658  Gross per 24 hour  Intake          1690.85 ml  Output             2151 ml  Net          -460.15 ml   Filed Weights   10/06/16 0753 10/07/16 0506 10/08/16 0500  Weight: 258 lb 11.2 oz (117.3 kg) 257 lb 9.6 oz (116.8 kg) 256 lb 12.8 oz (116.5 kg)    Telemetry    Sinus with 1st degree AV block, PVCs - Personally Reviewed  ECG      Physical Exam   General: Well developed, well nourished, NAD  HEENT: OP clear, mucus membranes moist  SKIN: warm, dry. No rashes. Neuro: No focal deficits  Musculoskeletal: Muscle strength 5/5 all ext  Psychiatric: Mood and  affect normal  Neck: No JVD, no carotid bruits, no thyromegaly, no lymphadenopathy.  Lungs:Clear bilaterally, no wheezes, rhonci, crackles Cardiovascular: Regular rate and rhythm. No murmurs, gallops or rubs. Abdomen:Soft. Bowel sounds present. Non-tender.  Extremities: 1+ bilaterral lower extremity edema. Pulses are 2 + in the bilateral DP/PT.   Labs    Chemistry  Recent Labs Lab 10/06/16 0242 10/07/16 0350 10/08/16 0259  NA 135 133* 133*  K 4.1 4.2 4.3  CL 106 102 102  CO2 20* 23 24  GLUCOSE 211* 238* 219*  BUN 61* 67* 69*  CREATININE 3.15* 3.01* 2.97*  CALCIUM 8.2* 8.3* 8.4*  GFRNONAA 18* 19* 19*  GFRAA 21* 22* 23*  ANIONGAP 9 8 7      Hematology  Recent Labs Lab 10/06/16 0242 10/07/16 0350 10/08/16 0259  WBC 9.0 10.7* 10.1  RBC 3.33* 3.22* 3.17*  HGB 9.3* 9.3* 9.1*  HCT 30.5* 29.3* 28.6*  MCV 91.6 91.0 90.2  MCH 27.9 28.9 28.7  MCHC 30.5 31.7 31.8  RDW 14.2 14.0 13.9  PLT 259 261  278    Cardiac Enzymes  Recent Labs Lab 10/04/16 1625 10/04/16 2155 10/05/16 0218  TROPONINI 22.55* 30.58* 21.47*     Recent Labs Lab 10/04/16 1229  TROPIPOC 13.35*     BNP  Recent Labs Lab 10/04/16 1210  BNP 943.6*     DDimer No results for input(s): DDIMER in the last 168 hours.    Radiology    No results found.  Cardiac Studies   Echo: 10/04/16  Study Conclusions  - Left ventricle: Poor acoustic windows limit studly Defiinity   used. LVEF is approximately 35% with diffuse hypokinesis. The   cavity size was mildly dilated. Wall thickness was increased in a   pattern of mild LVH. Doppler parameters are consistent with   abnormal left ventricular relaxation (grade 1 diastolic   dysfunction). Doppler parameters are consistent with high   ventricular filling pressure.  Patient Profile     71 y.o. male with history of DM, HTN, CVA, HTN, chronic diastolic CHF admitted with dyspnea, CHF.    Assessment & Plan    1. NSTEMI: Admitted with chest  pain and dyspnea in setting of volume overload with peak troponin of 30.58. LVEF=35% on echo. I think cardiac cath is indicated. This has been delayed due to volume overload and renal insufficiency. He has been symptom free on IV NTG and IV heparin. Nephrology has seen him and recommended that we proceed with cardiac cath knowing the risk of worsened renal function post cath is a real possibility.  -Lasix held last night.  -Cath today. If complex disease, will stage PCI due to renal insufficiency -Continue ASA, IV heparin, IV NTG, statin and beta blocker.    2. Ischemic cardiomyopathy: LEVF 35% by echo. He is on low dose beta blocker. No Ace-inh/ARB with CKD.  -Continue beta blocker. No other changes today  3. Acute systolic CHF: Volume status is much better after diuresis. He is negative 1.9 liters since admission. He may need more diuresis following cath, before discharge.   4. Acute on chronic renal failure: Appreciate Nephrology input.  -Creatinine is reviewed this am. Stable. Proceed with cath.   5. Chronic Anemia: H/H stable. Not clear why he is anemic. No bleeding. Likely chronic due to iron deficiency and chronic renal disease.   6. DM:  Increasing Lantus  Signed, Lauree Chandler, MD  10/08/2016, 7:33 AM

## 2016-10-08 NOTE — Progress Notes (Addendum)
ANTICOAGULATION CONSULT NOTE - Follow Up Consult  Pharmacy Consult for Heparin Indication: chest pain/ACS  Allergies  Allergen Reactions  . Lisinopril     Hyperkalemia   . Losartan     Hyperkalemia     Patient Measurements: Height: 5\' 11"  (180.3 cm) Weight: 256 lb 12.8 oz (116.5 kg) IBW/kg (Calculated) : 75.3 Heparin Dosing Weight: 100kg  Vital Signs: Temp: 98.6 F (37 C) (09/14 0400) Temp Source: Oral (09/14 0400) BP: 126/82 (09/14 0400) Pulse Rate: 65 (09/14 0400)  Labs:  Recent Labs  10/06/16 0242  10/06/16 2203 10/07/16 0350 10/07/16 1635 10/08/16 0259  HGB 9.3*  --   --  9.3*  --  9.1*  HCT 30.5*  --   --  29.3*  --  28.6*  PLT 259  --   --  261  --  278  LABPROT  --   --   --   --  13.4  --   INR  --   --   --   --  1.03  --   HEPARINUNFRC 0.29*  < > 0.25* 0.35  --  0.51  CREATININE 3.15*  --   --  3.01*  --  2.97*  < > = values in this interval not displayed.  Estimated Creatinine Clearance: 28.8 mL/min (A) (by C-G formula based on SCr of 2.97 mg/dL (H)).   Medications:  Heparin @ 1450 units/hr  Assessment: 71yom continues on heparin for NSTEMI with plan for cath today.   Heparin level is within goal range at 0.51 today after rate increase to 1450 units/hr. H/H and platelets remain stable. No signs/symptoms of bleeding noted in the chart.    Goal of Therapy:  Heparin level 0.3-0.7 units/ml Monitor platelets by anticoagulation protocol: Yes   Plan:  1) Continue heparin at 1450 units/hr 2) Check daily CBC and heparin levels while on heparin infusion 3) Follow up with heparin plans following cath  Doylene Canard, PharmD Clinical Pharmacist  Phone: [phone] 10/08/2016,9:45 AM

## 2016-10-08 NOTE — H&P (View-Only) (Signed)
.cdm   Progress Note  Patient Name: Mitchell Garcia Date of Encounter: 10/08/2016  Primary Cardiologist: Angelena Form  Subjective   No chest pain or dyspnea   Inpatient Medications    Scheduled Meds: . aspirin EC  81 mg Oral Daily  . atorvastatin  80 mg Oral QHS  . carvedilol  3.125 mg Oral BID WC  . gabapentin  300 mg Oral BID  . Influenza vac split quadrivalent PF  0.5 mL Intramuscular Tomorrow-1000  . insulin aspart  0-5 Units Subcutaneous QHS  . insulin aspart  0-9 Units Subcutaneous TID WC  . insulin glargine  20 Units Subcutaneous QHS  . primidone  100 mg Oral Daily  . sodium chloride flush  3 mL Intravenous Q12H   Continuous Infusions: . sodium chloride    . sodium chloride 10 mL/hr at 10/08/16 0639  . heparin 1,450 Units/hr (10/08/16 0345)  . nitroGLYCERIN 35 mcg/min (10/07/16 1950)   PRN Meds: sodium chloride, acetaminophen, nitroGLYCERIN, ondansetron (ZOFRAN) IV, sodium chloride flush   Vital Signs    Vitals:   10/07/16 1955 10/08/16 0043 10/08/16 0400 10/08/16 0500  BP: (!) 162/75 (!) 145/70 126/82   Pulse: (!) 59 (!) 55 65   Resp: (!) 22  (!) 22   Temp: 97.8 F (36.6 C) 98.6 F (37 C) 98.6 F (37 C)   TempSrc: Oral Oral Oral   SpO2: 96% 94% 93%   Weight:    256 lb 12.8 oz (116.5 kg)  Height:        Intake/Output Summary (Last 24 hours) at 10/08/16 0733 Last data filed at 10/08/16 0658  Gross per 24 hour  Intake          1690.85 ml  Output             2151 ml  Net          -460.15 ml   Filed Weights   10/06/16 0753 10/07/16 0506 10/08/16 0500  Weight: 258 lb 11.2 oz (117.3 kg) 257 lb 9.6 oz (116.8 kg) 256 lb 12.8 oz (116.5 kg)    Telemetry    Sinus with 1st degree AV block, PVCs - Personally Reviewed  ECG      Physical Exam   General: Well developed, well nourished, NAD  HEENT: OP clear, mucus membranes moist  SKIN: warm, dry. No rashes. Neuro: No focal deficits  Musculoskeletal: Muscle strength 5/5 all ext  Psychiatric: Mood and  affect normal  Neck: No JVD, no carotid bruits, no thyromegaly, no lymphadenopathy.  Lungs:Clear bilaterally, no wheezes, rhonci, crackles Cardiovascular: Regular rate and rhythm. No murmurs, gallops or rubs. Abdomen:Soft. Bowel sounds present. Non-tender.  Extremities: 1+ bilaterral lower extremity edema. Pulses are 2 + in the bilateral DP/PT.   Labs    Chemistry  Recent Labs Lab 10/06/16 0242 10/07/16 0350 10/08/16 0259  NA 135 133* 133*  K 4.1 4.2 4.3  CL 106 102 102  CO2 20* 23 24  GLUCOSE 211* 238* 219*  BUN 61* 67* 69*  CREATININE 3.15* 3.01* 2.97*  CALCIUM 8.2* 8.3* 8.4*  GFRNONAA 18* 19* 19*  GFRAA 21* 22* 23*  ANIONGAP 9 8 7      Hematology  Recent Labs Lab 10/06/16 0242 10/07/16 0350 10/08/16 0259  WBC 9.0 10.7* 10.1  RBC 3.33* 3.22* 3.17*  HGB 9.3* 9.3* 9.1*  HCT 30.5* 29.3* 28.6*  MCV 91.6 91.0 90.2  MCH 27.9 28.9 28.7  MCHC 30.5 31.7 31.8  RDW 14.2 14.0 13.9  PLT 259 261  278    Cardiac Enzymes  Recent Labs Lab 10/04/16 1625 10/04/16 2155 10/05/16 0218  TROPONINI 22.55* 30.58* 21.47*     Recent Labs Lab 10/04/16 1229  TROPIPOC 13.35*     BNP  Recent Labs Lab 10/04/16 1210  BNP 943.6*     DDimer No results for input(s): DDIMER in the last 168 hours.    Radiology    No results found.  Cardiac Studies   Echo: 10/04/16  Study Conclusions  - Left ventricle: Poor acoustic windows limit studly Defiinity   used. LVEF is approximately 35% with diffuse hypokinesis. The   cavity size was mildly dilated. Wall thickness was increased in a   pattern of mild LVH. Doppler parameters are consistent with   abnormal left ventricular relaxation (grade 1 diastolic   dysfunction). Doppler parameters are consistent with high   ventricular filling pressure.  Patient Profile     71 y.o. male with history of DM, HTN, CVA, HTN, chronic diastolic CHF admitted with dyspnea, CHF.    Assessment & Plan    1. NSTEMI: Admitted with chest  pain and dyspnea in setting of volume overload with peak troponin of 30.58. LVEF=35% on echo. I think cardiac cath is indicated. This has been delayed due to volume overload and renal insufficiency. He has been symptom free on IV NTG and IV heparin. Nephrology has seen him and recommended that we proceed with cardiac cath knowing the risk of worsened renal function post cath is a real possibility.  -Lasix held last night.  -Cath today. If complex disease, will stage PCI due to renal insufficiency -Continue ASA, IV heparin, IV NTG, statin and beta blocker.    2. Ischemic cardiomyopathy: LEVF 35% by echo. He is on low dose beta blocker. No Ace-inh/ARB with CKD.  -Continue beta blocker. No other changes today  3. Acute systolic CHF: Volume status is much better after diuresis. He is negative 1.9 liters since admission. He may need more diuresis following cath, before discharge.   4. Acute on chronic renal failure: Appreciate Nephrology input.  -Creatinine is reviewed this am. Stable. Proceed with cath.   5. Chronic Anemia: H/H stable. Not clear why he is anemic. No bleeding. Likely chronic due to iron deficiency and chronic renal disease.   6. DM:  Increasing Lantus  Signed, Lauree Chandler, MD  10/08/2016, 7:33 AM

## 2016-10-08 NOTE — Brief Op Note (Signed)
Repeat Cardiac Catheterization Note (Full report to follow)  Date: 10/08/2016  11:41 AM  PATIENT:  Mitchell Garcia  71 y.o. male  PRE-OPERATIVE DIAGNOSIS:   NSTEMI and acute systolic heart failure   POST-OPERATIVE DIAGNOSIS:  same  PROCEDURE:  Procedure(s): LEFT HEART CATH AND CORONARY ANGIOGRAPHY (N/A)  SURGEON:  Surgeon(s) and Role:    Nelva Bush, MD - Primary  FINDINGS: 1. Significant multivessel coronary artery disease, including 50% mid LAD stenosis, 70% proximal D1 lesion, chronic total occlusion of mid LCx, and sequential 80-90 and 95% ostial and mid/distal RCA stenoses. 2. Moderate to severely elevated left ventricular filling pressure.   RECOMMENDATIONS: 1. Cardiac surgery consultation for CABG, given complex multivessel coronary artery disease, diabetes mellitus, and reduced LVEF. The patient is deemed a poor surgical candidate, consider PCI to RCA (this would likely require atherectomy of the ostial lesion). 2. Restart heparin infusion for hours following a TR band deflation.  3. Continue diuresis and optimization of heart failure therapy.   Nelva Bush, MD [facility] Pager: [phone]

## 2016-10-08 NOTE — Progress Notes (Signed)
Patient had 2.10 second pause on tele. PA notified and aware. Patient resting comfortably. Nursing will continue to monitor.

## 2016-10-09 DIAGNOSIS — I214 Non-ST elevation (NSTEMI) myocardial infarction: Secondary | ICD-10-CM | POA: Diagnosis not present

## 2016-10-09 LAB — BASIC METABOLIC PANEL
ANION GAP: 8 (ref 5–15)
BUN: 69 mg/dL — ABNORMAL HIGH (ref 6–20)
CALCIUM: 8.5 mg/dL — AB (ref 8.9–10.3)
CO2: 23 mmol/L (ref 22–32)
CREATININE: 2.92 mg/dL — AB (ref 0.61–1.24)
Chloride: 103 mmol/L (ref 101–111)
GFR, EST AFRICAN AMERICAN: 23 mL/min — AB (ref >60)
GFR, EST NON AFRICAN AMERICAN: 20 mL/min — AB (ref >60)
Glucose, Bld: 203 mg/dL — ABNORMAL HIGH (ref 65–99)
Potassium: 4.3 mmol/L (ref 3.5–5.1)
SODIUM: 134 mmol/L — AB (ref 135–145)

## 2016-10-09 LAB — BLOOD GAS, ARTERIAL
Acid-base deficit: 1.3 mmol/L (ref 0.0–2.0)
Bicarbonate: 23.9 mmol/L (ref 20.0–28.0)
Drawn by: 418751
O2 Content: 21 L/min
O2 Saturation: 90.3 %
Patient temperature: 98.6
pCO2 arterial: 47.2 mmHg (ref 32.0–48.0)
pH, Arterial: 7.324 — ABNORMAL LOW (ref 7.350–7.450)
pO2, Arterial: 61.8 mmHg — ABNORMAL LOW (ref 83.0–108.0)

## 2016-10-09 LAB — CBC
HCT: 29.1 % — ABNORMAL LOW (ref 39.0–52.0)
Hemoglobin: 9.3 g/dL — ABNORMAL LOW (ref 13.0–17.0)
MCH: 29 pg (ref 26.0–34.0)
MCHC: 32 g/dL (ref 30.0–36.0)
MCV: 90.7 fL (ref 78.0–100.0)
PLATELETS: 279 10*3/uL (ref 150–400)
RBC: 3.21 MIL/uL — AB (ref 4.22–5.81)
RDW: 14 % (ref 11.5–15.5)
WBC: 9 10*3/uL (ref 4.0–10.5)

## 2016-10-09 LAB — HEPARIN LEVEL (UNFRACTIONATED): HEPARIN UNFRACTIONATED: 0.33 [IU]/mL (ref 0.30–0.70)

## 2016-10-09 LAB — GLUCOSE, CAPILLARY
GLUCOSE-CAPILLARY: 273 mg/dL — AB (ref 65–99)
GLUCOSE-CAPILLARY: 310 mg/dL — AB (ref 65–99)
Glucose-Capillary: 217 mg/dL — ABNORMAL HIGH (ref 65–99)
Glucose-Capillary: 306 mg/dL — ABNORMAL HIGH (ref 65–99)

## 2016-10-09 LAB — BRAIN NATRIURETIC PEPTIDE: B Natriuretic Peptide: 546.7 pg/mL — ABNORMAL HIGH (ref 0.0–100.0)

## 2016-10-09 NOTE — Progress Notes (Signed)
[facility] Progress Note    Assessment/ Plan:   1. Acute kidney injury superimposed on CKD Stage 3b-4: hemodynamically mediated in the setting of new sCHF and NSTEMI likely from coronary ischemia.  He needs a cath.  Pt, wife, and I discussed risks of CIN and worsening of AKI from contrast for heart cath.  We also discussed possibly needing dialysis, either temporarily or permanently, from the procedure.  Pt has accepted these risks and is willing to proceed. Cr is a little better; however watch for CIN 48-72 hr after procedure  2. Acute systolic CHF/NSTEMI: troponins peaked in the 30s.  On hep gtt and nitro gtt.  Cath with 3 vessel disease, being worked up for CABG.  Lasix 80 IV TID  3. DM II: A1c 7.6  4.  Ischemic cardiomyopathy: on BB, no ACEi/ARB due to h/o hyperkalemia and renal insufficiency.  Subjective:    S/p CABG with 3 vessel disease, being worked up for CABG.  Diuresis resumed    Objective:   BP 129/66 (BP Location: Right Arm)   Pulse 72   Temp 97.9 F (36.6 C) (Oral)   Resp (!) 21   Ht 5\' 11"  (1.803 m)   Wt 117.2 kg (258 lb 6.1 oz)   SpO2 97%   BMI 36.04 kg/m   Intake/Output Summary (Last 24 hours) at 10/09/16 1118 Last data filed at 10/09/16 0827  Gross per 24 hour  Intake           1127.5 ml  Output             2121 ml  Net           -993.5 ml   Weight change: 0.716 kg (1 lb 9.3 oz)  Physical Exam: GEN obese, sleeping HEENT EOMI, PERRL NECK JVD improved PULM distant lung sounds, bibasilar crackles still present, somewhat improved CV RRR no m/r/g ABD large pannus EXT 1-2+ pitting edema bilaterally NEURO AAO x 3, R arm deficits MSK: L arm with antecubital infiltration extending up the medial aspect of the arm- no frank hematoma  Imaging: No results found.  Labs: BMET  Recent Labs Lab 10/04/16 1210 10/05/16 0218 10/06/16 0242 10/07/16 0350 10/08/16 0259 10/09/16 0346  NA 139 137 135 133* 133* 134*  K 3.9 3.7 4.1 4.2 4.3  4.3  CL 108 106 106 102 102 103  CO2 23 23 20* 23 24 23   GLUCOSE 140* 194* 211* 238* 219* 203*  BUN 50* 58* 61* 67* 69* 69*  CREATININE 2.93* 3.06* 3.15* 3.01* 2.97* 2.92*  CALCIUM 8.9 8.6* 8.2* 8.3* 8.4* 8.5*   CBC  Recent Labs Lab 10/06/16 0242 10/07/16 0350 10/08/16 0259 10/09/16 0346  WBC 9.0 10.7* 10.1 9.0  HGB 9.3* 9.3* 9.1* 9.3*  HCT 30.5* 29.3* 28.6* 29.1*  MCV 91.6 91.0 90.2 90.7  PLT 259 261 278 279    Medications:    . aspirin EC  81 mg Oral Daily  . atorvastatin  80 mg Oral QHS  . carvedilol  3.125 mg Oral BID WC  . furosemide  80 mg Intravenous TID  . gabapentin  300 mg Oral BID  . insulin aspart  0-5 Units Subcutaneous QHS  . insulin aspart  0-9 Units Subcutaneous TID WC  . insulin glargine  20 Units Subcutaneous QHS  . primidone  100 mg Oral Daily  . sodium chloride flush  3 mL Intravenous Q12H      Madelon Lips MD [facility] pgr [phone] 10/09/2016, 11:18  AM

## 2016-10-09 NOTE — Progress Notes (Signed)
Progress Note  Patient Name: Mitchell Garcia Date of Encounter: 10/09/2016    Subjective   No chest pain, no SOB  Inpatient Medications    Scheduled Meds: . aspirin EC  81 mg Oral Daily  . atorvastatin  80 mg Oral QHS  . carvedilol  3.125 mg Oral BID WC  . furosemide  80 mg Intravenous TID  . gabapentin  300 mg Oral BID  . insulin aspart  0-5 Units Subcutaneous QHS  . insulin aspart  0-9 Units Subcutaneous TID WC  . insulin glargine  20 Units Subcutaneous QHS  . primidone  100 mg Oral Daily  . sodium chloride flush  3 mL Intravenous Q12H   Continuous Infusions: . sodium chloride    . heparin 1,450 Units/hr (10/09/16 0910)  . nitroGLYCERIN 35 mcg/min (10/09/16 0600)   PRN Meds: sodium chloride, acetaminophen, nitroGLYCERIN, ondansetron (ZOFRAN) IV, sodium chloride flush   Vital Signs    Vitals:   10/08/16 2000 10/09/16 0000 10/09/16 0400 10/09/16 0820  BP: (!) 144/69 (!) 150/61 135/68 129/66  Pulse: (!) 57 (!) 55 66 72  Resp: 17 (!) 21 15 (!) 21  Temp: 97.7 F (36.5 C) 98.2 F (36.8 C) 98.3 F (36.8 C) 97.9 F (36.6 C)  TempSrc: Oral Oral Oral Oral  SpO2: 98% 94% 93% 97%  Weight:   258 lb 6.1 oz (117.2 kg)   Height:        Intake/Output Summary (Last 24 hours) at 10/09/16 1055 Last data filed at 10/09/16 0827  Gross per 24 hour  Intake           1127.5 ml  Output             2121 ml  Net           -993.5 ml   Filed Weights   10/07/16 0506 10/08/16 0500 10/09/16 0400  Weight: 257 lb 9.6 oz (116.8 kg) 256 lb 12.8 oz (116.5 kg) 258 lb 6.1 oz (117.2 kg)    Telemetry    SR and PACs- Personally Reviewed  ECG    n/a  Physical Exam   GEN: No acute distress.   Neck: No JVD Cardiac: RRR, no murmurs, rubs, or gallops.  Respiratory: Clear to auscultation bilaterally. GI: Soft, nontender, non-distended  MS: 1+ bilateral LE edema. Neuro:  Nonfocal  Psych: Normal affect   Labs    Chemistry Recent Labs Lab 10/07/16 0350 10/08/16 0259  10/09/16 0346  NA 133* 133* 134*  K 4.2 4.3 4.3  CL 102 102 103  CO2 23 24 23   GLUCOSE 238* 219* 203*  BUN 67* 69* 69*  CREATININE 3.01* 2.97* 2.92*  CALCIUM 8.3* 8.4* 8.5*  GFRNONAA 19* 19* 20*  GFRAA 22* 23* 23*  ANIONGAP 8 7 8      Hematology Recent Labs Lab 10/07/16 0350 10/08/16 0259 10/09/16 0346  WBC 10.7* 10.1 9.0  RBC 3.22* 3.17* 3.21*  HGB 9.3* 9.1* 9.3*  HCT 29.3* 28.6* 29.1*  MCV 91.0 90.2 90.7  MCH 28.9 28.7 29.0  MCHC 31.7 31.8 32.0  RDW 14.0 13.9 14.0  PLT 261 278 279    Cardiac Enzymes Recent Labs Lab 10/04/16 1625 10/04/16 2155 10/05/16 0218  TROPONINI 22.55* 30.58* 21.47*    Recent Labs Lab 10/04/16 1229  TROPIPOC 13.35*     BNP Recent Labs Lab 10/04/16 1210 10/09/16 0346  BNP 943.6* 546.7*     DDimer No results for input(s): DDIMER in the last 168 hours.   Radiology  No results found.  Cardiac Studies     Patient Profile     71 y.o. male with history of DM, HTN, CVA, HTN, chronic diastolic CHF admitted with dyspnea, CHF.    Assessment & Plan    1. NSTEMI - Admitted with chest pain and dyspnea in setting of volume overload with peak troponin of 30.58. LVEF=35% on echo which is new drop in LVEF - cath yesterday with multivessel CAD with 50% prox LAD, 70% D1, CTO LCX, 80% ostial RCA and 95% distal RCA - CT surgery consulted, if not canidate consider PCI to RCA - LVEDP was 33, will need continued diuresis - on ASA, atorva, coreg, hep gtt, nitro gtt. No ACE/ARB due to poor renal function  - CT surgery evaluating, final decision on surgical options pending. If not consider PCI once volume status better optimized.   2. Acute systolic HF - negative 1.5 liters yesterday, negative 4.1 liters since admission. Mild downtrend in Cr with diuresis, continue lasix 80mg  IV tid - medical therapy with coreg. No ACE-I/ARB/ARNI/aldactone given poor renal function. May consider hydral/nitrates in the near future.   3. AKI on CKD -  followed with assistance of nephrology - fairly stable after cath  4. Left arm swelling - small hematoma at site of left brachial IV that infiltrated, continue to monitor   For questions or updates, please contact [place] Please consult [email or web address] for contact info under Cardiology/STEMI.      Merrily Pew, MD  10/09/2016, 10:55 AM

## 2016-10-09 NOTE — Progress Notes (Signed)
[place] for Heparin Indication: chest pain/ACS  Allergies  Allergen Reactions  . Lisinopril     Hyperkalemia   . Losartan     Hyperkalemia     Patient Measurements: Height: 5\' 11"  (180.3 cm) Weight: 258 lb 6.1 oz (117.2 kg) IBW/kg (Calculated) : 75.3 Heparin Dosing Weight: 100kg  Vital Signs: Temp: 97.9 F (36.6 C) (09/15 0820) Temp Source: Oral (09/15 0820) BP: 129/66 (09/15 0820) Pulse Rate: 72 (09/15 0820)  Labs:  Recent Labs  10/07/16 0350 10/07/16 1635 10/08/16 0259 10/09/16 0346  HGB 9.3*  --  9.1* 9.3*  HCT 29.3*  --  28.6* 29.1*  PLT 261  --  278 279  LABPROT  --  13.4  --   --   INR  --  1.03  --   --   HEPARINUNFRC 0.35  --  0.51 0.33  CREATININE 3.01*  --  2.97* 2.92*    Estimated Creatinine Clearance: 29.4 mL/min (A) (by C-G formula based on SCr of 2.92 mg/dL (H)).   Medications:  Heparin @ 1450 units/hr  Assessment: 71 yo male to continue heparin s/p cath today. Cath showed multivessel disease, cardiology planning to get CABG consult. Was previously therapeutic on 1450 units/hr. Infusion was restarted 2 hours after TR band was off yesterday at previous rate.  Heparin level came back within goal range at 0.33. H/H and platelets remain stable. No signs/symptoms of bleeding noted.     Goal of Therapy:  Heparin level 0.3-0.7 units/ml Monitor platelets by anticoagulation protocol: Yes   Plan:  1) Continue heparin at 1450 units/hr 2) Check daily CBC and heparin levels while on heparin infusion  Doylene Canard, PharmD Clinical Pharmacist  Phone: [phone]

## 2016-10-10 DIAGNOSIS — I5043 Acute on chronic combined systolic (congestive) and diastolic (congestive) heart failure: Secondary | ICD-10-CM

## 2016-10-10 LAB — BASIC METABOLIC PANEL
ANION GAP: 10 (ref 5–15)
BUN: 70 mg/dL — AB (ref 6–20)
CALCIUM: 8.8 mg/dL — AB (ref 8.9–10.3)
CO2: 24 mmol/L (ref 22–32)
Chloride: 101 mmol/L (ref 101–111)
Creatinine, Ser: 2.94 mg/dL — ABNORMAL HIGH (ref 0.61–1.24)
GFR calc Af Amer: 23 mL/min — ABNORMAL LOW (ref >60)
GFR, EST NON AFRICAN AMERICAN: 20 mL/min — AB (ref >60)
GLUCOSE: 243 mg/dL — AB (ref 65–99)
Potassium: 4.4 mmol/L (ref 3.5–5.1)
Sodium: 135 mmol/L (ref 135–145)

## 2016-10-10 LAB — CBC
HEMATOCRIT: 32.1 % — AB (ref 39.0–52.0)
Hemoglobin: 10.2 g/dL — ABNORMAL LOW (ref 13.0–17.0)
MCH: 29 pg (ref 26.0–34.0)
MCHC: 31.8 g/dL (ref 30.0–36.0)
MCV: 91.2 fL (ref 78.0–100.0)
PLATELETS: 300 10*3/uL (ref 150–400)
RBC: 3.52 MIL/uL — ABNORMAL LOW (ref 4.22–5.81)
RDW: 14 % (ref 11.5–15.5)
WBC: 9.9 10*3/uL (ref 4.0–10.5)

## 2016-10-10 LAB — GLUCOSE, CAPILLARY
Glucose-Capillary: 205 mg/dL — ABNORMAL HIGH (ref 65–99)
Glucose-Capillary: 296 mg/dL — ABNORMAL HIGH (ref 65–99)

## 2016-10-10 LAB — HEPARIN LEVEL (UNFRACTIONATED): Heparin Unfractionated: 0.5 IU/mL (ref 0.30–0.70)

## 2016-10-10 LAB — GLUCOSE, RANDOM: GLUCOSE: 400 mg/dL — AB (ref 65–99)

## 2016-10-10 MED ORDER — ISOSORB DINITRATE-HYDRALAZINE 20-37.5 MG PO TABS
1.0000 | ORAL_TABLET | Freq: Two times a day (BID) | ORAL | Status: DC
  Administered 2016-10-10 – 2016-10-18 (×17): 1 via ORAL
  Filled 2016-10-10 (×18): qty 1

## 2016-10-10 MED ORDER — ONDANSETRON HCL 4 MG/2ML IJ SOLN: 4.0000 mg | Freq: Four times a day (QID) | INTRAMUSCULAR | Status: DC | PRN

## 2016-10-10 NOTE — Progress Notes (Signed)
Progress Note  Patient Name: Mitchell Garcia Date of Encounter: 10/10/2016    Subjective   Some mild SOB this AM. Feels leg swelling has increased some.   Inpatient Medications    Scheduled Meds: . aspirin EC  81 mg Oral Daily  . atorvastatin  80 mg Oral QHS  . carvedilol  3.125 mg Oral BID WC  . furosemide  80 mg Intravenous TID  . gabapentin  300 mg Oral BID  . insulin aspart  0-5 Units Subcutaneous QHS  . insulin aspart  0-9 Units Subcutaneous TID WC  . insulin glargine  20 Units Subcutaneous QHS  . primidone  100 mg Oral Daily  . sodium chloride flush  3 mL Intravenous Q12H   Continuous Infusions: . sodium chloride    . heparin 1,450 Units/hr (10/10/16 0559)  . nitroGLYCERIN 10 mcg/min (10/09/16 2013)   PRN Meds: sodium chloride, acetaminophen, nitroGLYCERIN, ondansetron (ZOFRAN) IV, sodium chloride flush   Vital Signs    Vitals:   10/09/16 1953 10/10/16 0006 10/10/16 0415 10/10/16 0835  BP: (!) 120/57 126/64 (!) 115/56 (!) 133/58  Pulse:   (!) 55 78  Resp: 18 18 16 18   Temp:  98 F (36.7 C) 98.1 F (36.7 C) 98 F (36.7 C)  TempSrc: Oral Oral Oral Oral  SpO2:  96% 93% 95%  Weight:   256 lb (116.1 kg)   Height:        Intake/Output Summary (Last 24 hours) at 10/10/16 1113 Last data filed at 10/10/16 0830  Gross per 24 hour  Intake             1440 ml  Output             2550 ml  Net            -1110 ml   Filed Weights   10/08/16 0500 10/09/16 0400 10/10/16 0415  Weight: 256 lb 12.8 oz (116.5 kg) 258 lb 6.1 oz (117.2 kg) 256 lb (116.1 kg)    Telemetry    SR and sinus tach - Personally Reviewed  ECG     Physical Exam   GEN: No acute distress.   Neck: No JVD Cardiac: RRR, no murmurs, rubs, or gallops.  Respiratory: decreased breath sounds bilateral bases GI: Soft, nontender, non-distended  MS: 1+ bilateral LE edema No deformity. Neuro:  Nonfocal  Psych: Normal affect   Labs    Chemistry Recent Labs Lab 10/08/16 0259  10/09/16 0346 10/10/16 0241  NA 133* 134* 135  K 4.3 4.3 4.4  CL 102 103 101  CO2 24 23 24   GLUCOSE 219* 203* 243*  BUN 69* 69* 70*  CREATININE 2.97* 2.92* 2.94*  CALCIUM 8.4* 8.5* 8.8*  GFRNONAA 19* 20* 20*  GFRAA 23* 23* 23*  ANIONGAP 7 8 10      Hematology Recent Labs Lab 10/08/16 0259 10/09/16 0346 10/10/16 0241  WBC 10.1 9.0 9.9  RBC 3.17* 3.21* 3.52*  HGB 9.1* 9.3* 10.2*  HCT 28.6* 29.1* 32.1*  MCV 90.2 90.7 91.2  MCH 28.7 29.0 29.0  MCHC 31.8 32.0 31.8  RDW 13.9 14.0 14.0  PLT 278 279 300    Cardiac Enzymes Recent Labs Lab 10/04/16 1625 10/04/16 2155 10/05/16 0218  TROPONINI 22.55* 30.58* 21.47*    Recent Labs Lab 10/04/16 1229  TROPIPOC 13.35*     BNP Recent Labs Lab 10/04/16 1210 10/09/16 0346  BNP 943.6* 546.7*     DDimer No results for input(s): DDIMER in the last  168 hours.   Radiology    No results found.  Cardiac Studies     Patient Profile        71 y.o.malewith history of DM, HTN, CVA, HTN, chronic diastolic CHF admitted with dyspnea, CHF.   Assessment & Plan    1. NSTEMI - Admitted with chest pain and dyspnea in setting of volume overload with peak troponin of 30.58. LVEF=35% on echo which is new drop in LVEF - cath with multivessel CAD with 50% prox LAD, 70% D1, CTO LCX, 80% ostial RCA and 95% distal RCA - CT surgery consulted, if not canidate consider PCI to RCA - LVEDP was 33, will need continued diuresis - on ASA, atorva, coreg, hep gtt, nitro gtt. No ACE/ARB due to poor renal function  - CT surgery evaluating, final decision on surgical options pending. If not consider PCI once volume status better optimized. He has PFTs, cardiac rehab eval, and carotid US ordered   2. Acute systolic HF - negative 1.2 liters yesterday, negative 5.2 liters since admission. Fairly stable renal function with diuresis, continue lasix 80mg  IV tid. LVEDP was 33 by cath.  - medical therapy with coreg. No ACE-I/ARB/ARNI/aldactone  given poor renal function. He also has an allergy listed to ACE-I and ARBs. - Start low dose bidil bid initiaally - may titrate up diuretics over next day or so as time increases since cath dye load.   3. AKI on CKD - followed with assistance of nephrology - fairly stable after cath  4. Left arm swelling - small hematoma at site of left brachial IV that infiltrated, continue to monitor - overall appears stable today, mild decrease in swelling    For questions or updates, please contact [place] Please consult [email or web address] for contact info under Cardiology/STEMI.      Merrily Pew, MD  10/10/2016, 11:13 AM

## 2016-10-10 NOTE — Progress Notes (Signed)
[place] for Heparin Indication: chest pain/ACS  Allergies  Allergen Reactions  . Lisinopril     Hyperkalemia   . Losartan     Hyperkalemia     Patient Measurements: Height: 5\' 11"  (180.3 cm) Weight: 256 lb (116.1 kg) IBW/kg (Calculated) : 75.3 Heparin Dosing Weight: 100kg  Vital Signs: Temp: 98 F (36.7 C) (09/16 0835) Temp Source: Oral (09/16 0835) BP: 133/58 (09/16 0835) Pulse Rate: 78 (09/16 0835)  Labs:  Recent Labs  10/07/16 1635  10/08/16 0259 10/09/16 0346 10/10/16 0241  HGB  --   < > 9.1* 9.3* 10.2*  HCT  --   --  28.6* 29.1* 32.1*  PLT  --   --  278 279 300  LABPROT 13.4  --   --   --   --   INR 1.03  --   --   --   --   HEPARINUNFRC  --   --  0.51 0.33 0.50  CREATININE  --   --  2.97* 2.92* 2.94*  < > = values in this interval not displayed.  Estimated Creatinine Clearance: 29 mL/min (A) (by C-G formula based on SCr of 2.94 mg/dL (H)).   Medications:  Heparin @ 1450 units/hr  Assessment: 71 yo male to continue heparin s/p cath today. Cath showed multivessel disease, awaiting decision from CT surgery consult. Was previously therapeutic on 1450 units/hr. Infusion was restarted 2 hours after TR band was off yesterday at previous rate.  Heparin level continues to remain within goal range at 0.5. H/H and platelets remain stable. No signs/symptoms of bleeding noted.     Goal of Therapy:  Heparin level 0.3-0.7 units/ml Monitor platelets by anticoagulation protocol: Yes   Plan:  1) Continue heparin at 1450 units/hr 2) Check daily CBC and heparin levels while on heparin infusion  Doylene Canard, PharmD Clinical Pharmacist  Phone: [phone]

## 2016-10-10 NOTE — Evaluation (Signed)
Physical Therapy Evaluation Patient Details Name: Mitchell Garcia MRN: [id number] DOB: 01-27-1945 Today's Date: 10/10/2016   History of Present Illness  Mitchell Garcia is a 71 y.o. male with a history of NIDDM, HTN, Stroke, CKD, HTN, chronic diastolic CHF who presented from the [facility] office with progressive dyspnea. NSTEMI; plan for CABG vs PCI  Clinical Impression  Pt admitted with above diagnosis. Pt currently with functional limitations due to the deficits listed below (see PT Problem List). Overall walking well; VSS on Room Air; Placed OT consult for ADLs per protocol;  Pt will benefit from skilled PT to increase their independence and safety with mobility to allow discharge to the venue listed below.       Follow Up Recommendations Outpatient PT;Other (comment) (Cardiac Rehab)    Equipment Recommendations  Cane    Recommendations for Other Services OT consult (for ADLs)     Precautions / Restrictions Precautions Precautions: Fall      Mobility  Bed Mobility Overal bed mobility: Needs Assistance             General bed mobility comments: Bed mobility not observed by PT, however very inefficient and taxing per Dr. Prescott Gum  Transfers Overall transfer level: Needs assistance Equipment used: 1 person hand held assist Transfers: Sit to/from Stand Sit to Stand: Min assist         General transfer comment: Min assist to steady  Ambulation/Gait Ambulation/Gait assistance: Min guard (without physical contact) Ambulation Distance (Feet): 150 Feet Assistive device: None Gait Pattern/deviations: Step-through pattern;Decreased step length - right;Decreased step length - left     General Gait Details: Cues to self-monitor for activity tolerance  Stairs            Wheelchair Mobility    Modified Rankin (Stroke Patients Only)       Balance Overall balance assessment: Needs assistance           Standing balance-Leahy Scale: Fair  (approacign good)                               Pertinent Vitals/Pain Pain Assessment: No/denies pain    Home Living Family/patient expects to be discharged to:: Private residence Living Arrangements: Spouse/significant other Available Help at Discharge: Family;Available 24 hours/day Type of Home: House Home Access: Stairs to enter Entrance Stairs-Rails: None Entrance Stairs-Number of Steps: 2 Home Layout: One level Home Equipment: Cane - single point Additional Comments: Sleeps in recliner chair    Prior Function Level of Independence: Independent               Hand Dominance        Extremity/Trunk Assessment   Upper Extremity Assessment Upper Extremity Assessment: Defer to OT evaluation (R arm with impairments post fall with fractures a few yrs ag)    Lower Extremity Assessment Lower Extremity Assessment: Generalized weakness       Communication   Communication: HOH (speak to R ear)  Cognition Arousal/Alertness: Awake/alert Behavior During Therapy: WFL for tasks assessed/performed Overall Cognitive Status: Within Functional Limits for tasks assessed                                        General Comments      Exercises     Assessment/Plan    PT Assessment Patient needs continued PT services  PT  Problem List Decreased strength;Decreased range of motion;Decreased activity tolerance;Decreased balance;Decreased mobility;Decreased knowledge of use of DME;Cardiopulmonary status limiting activity       PT Treatment Interventions DME instruction;Gait training;Stair training;Functional mobility training;Therapeutic activities;Therapeutic exercise;Balance training;Patient/family education    PT Goals (Current goals can be found in the Care Plan section)  Acute Rehab PT Goals Patient Stated Goal: get better and get home PT Goal Formulation: With patient Time For Goal Achievement: 10/24/16 Potential to Achieve Goals: Good     Frequency Min 3X/week   Barriers to discharge        Co-evaluation               AM-PAC PT "6 Clicks" Daily Activity  Outcome Measure Difficulty turning over in bed (including adjusting bedclothes, sheets and blankets)?: A Lot Difficulty moving from lying on back to sitting on the side of the bed? : Unable Difficulty sitting down on and standing up from a chair with arms (e.g., wheelchair, bedside commode, etc,.)?: A Little Help needed moving to and from a bed to chair (including a wheelchair)?: A Little Help needed walking in hospital room?: A Little Help needed climbing 3-5 steps with a railing? : A Little 6 Click Score: 15    End of Session Equipment Utilized During Treatment: Gait belt Activity Tolerance: Patient tolerated treatment well Patient left: in bed;with call bell/phone within reach;Other (comment) (sittign EOB) Nurse Communication: Mobility status PT Visit Diagnosis: Other abnormalities of gait and mobility (R26.89)    Time: 6503-5465 PT Time Calculation (min) (ACUTE ONLY): 22 min   Charges:   PT Evaluation $PT Eval Moderate Complexity: 1 Mod     PT G Codes:        Roney Marion, PT  Acute Rehabilitation Services Pager [phone] Office [phone]   Colletta [place] 10/10/2016, 5:30 PM

## 2016-10-10 NOTE — Progress Notes (Signed)
[facility] Progress Note    Assessment/ Plan:   1. Acute kidney injury superimposed on CKD Stage 3b-4: Baseline creatninine 2.0-2.5.  Hemodynamically mediated in the setting of new sCHF and NSTEMI likely from coronary ischemia.  Pt, wife, and I discussed risks of CIN and worsening of AKI from contrast for heart cath.  We also discussed possibly needing dialysis, either temporarily or permanently, from the procedure.  Fortunately pt's cr seems to be stable after cath 9/14, will continue to watch.   2. Acute systolic CHF/NSTEMI: troponins peaked in the 30s.  On hep gtt and nitro gtt.  Cath with 3 vessel disease, being worked up for CABG vs PCI.  Lasix 80 IV TID with good effect.   3. DM II: A1c 7.6  4.  Ischemic cardiomyopathy: on BB, no ACEi/ARB due to h/o hyperkalemia and renal insufficiency.  BiDil started.  Subjective:    S/p CABG with 3 vessel disease, being worked up for CABG.  Diuresis resumed    Objective:   BP (!) 133/58 (BP Location: Left Arm)   Pulse 78   Temp 98 F (36.7 C) (Oral)   Resp 18   Ht 5\' 11"  (1.803 m)   Wt 116.1 kg (256 lb)   SpO2 95%   BMI 35.70 kg/m   Intake/Output Summary (Last 24 hours) at 10/10/16 1123 Last data filed at 10/10/16 0830  Gross per 24 hour  Intake             1440 ml  Output             2550 ml  Net            -1110 ml   Weight change: -1.079 kg (-2 lb 6.1 oz)  Physical Exam: GEN obese, sleeping HEENT EOMI, PERRL NECK JVD improved PULM distant lung sounds, bibasilar crackles still present, somewhat improved CV RRR no m/r/g ABD large pannus EXT 1-2+ pitting edema bilaterally NEURO AAO x 3, R arm deficits MSK: L arm with antecubital infiltration extending up the medial aspect of the arm- no frank hematoma  Imaging: No results found.  Labs: BMET  Recent Labs Lab 10/04/16 1210 10/05/16 0218 10/06/16 0242 10/07/16 0350 10/08/16 0259 10/09/16 0346 10/10/16 0241  NA 139 137 135 133* 133* 134* 135  K  3.9 3.7 4.1 4.2 4.3 4.3 4.4  CL 108 106 106 102 102 103 101  CO2 23 23 20* 23 24 23 24   GLUCOSE 140* 194* 211* 238* 219* 203* 243*  BUN 50* 58* 61* 67* 69* 69* 70*  CREATININE 2.93* 3.06* 3.15* 3.01* 2.97* 2.92* 2.94*  CALCIUM 8.9 8.6* 8.2* 8.3* 8.4* 8.5* 8.8*   CBC  Recent Labs Lab 10/07/16 0350 10/08/16 0259 10/09/16 0346 10/10/16 0241  WBC 10.7* 10.1 9.0 9.9  HGB 9.3* 9.1* 9.3* 10.2*  HCT 29.3* 28.6* 29.1* 32.1*  MCV 91.0 90.2 90.7 91.2  PLT 261 278 279 300    Medications:    . aspirin EC  81 mg Oral Daily  . atorvastatin  80 mg Oral QHS  . carvedilol  3.125 mg Oral BID WC  . furosemide  80 mg Intravenous TID  . gabapentin  300 mg Oral BID  . insulin aspart  0-5 Units Subcutaneous QHS  . insulin aspart  0-9 Units Subcutaneous TID WC  . insulin glargine  20 Units Subcutaneous QHS  . isosorbide-hydrALAZINE  1 tablet Oral BID  . primidone  100 mg Oral Daily  . sodium chloride flush  3 mL Intravenous Q12H      Madelon Lips MD [facility] pgr [phone] 10/10/2016, 11:23 AM

## 2016-10-11 ENCOUNTER — Encounter (HOSPITAL_COMMUNITY): Payer: Medicare Other

## 2016-10-11 LAB — CBC
HCT: 29.5 % — ABNORMAL LOW (ref 39.0–52.0)
HEMOGLOBIN: 9.4 g/dL — AB (ref 13.0–17.0)
MCH: 29.3 pg (ref 26.0–34.0)
MCHC: 31.9 g/dL (ref 30.0–36.0)
MCV: 91.9 fL (ref 78.0–100.0)
PLATELETS: 301 10*3/uL (ref 150–400)
RBC: 3.21 MIL/uL — AB (ref 4.22–5.81)
RDW: 14.2 % (ref 11.5–15.5)
WBC: 10.8 10*3/uL — AB (ref 4.0–10.5)

## 2016-10-11 LAB — BASIC METABOLIC PANEL
ANION GAP: 3 — AB (ref 5–15)
BUN: 68 mg/dL — AB (ref 6–20)
CHLORIDE: 104 mmol/L (ref 101–111)
CO2: 31 mmol/L (ref 22–32)
Calcium: 8.6 mg/dL — ABNORMAL LOW (ref 8.9–10.3)
Creatinine, Ser: 2.86 mg/dL — ABNORMAL HIGH (ref 0.61–1.24)
GFR, EST AFRICAN AMERICAN: 24 mL/min — AB (ref >60)
GFR, EST NON AFRICAN AMERICAN: 20 mL/min — AB (ref >60)
Glucose, Bld: 265 mg/dL — ABNORMAL HIGH (ref 65–99)
POTASSIUM: 4.4 mmol/L (ref 3.5–5.1)
SODIUM: 138 mmol/L (ref 135–145)

## 2016-10-11 LAB — HEPARIN LEVEL (UNFRACTIONATED): Heparin Unfractionated: 0.48 IU/mL (ref 0.30–0.70)

## 2016-10-11 LAB — GLUCOSE, CAPILLARY
GLUCOSE-CAPILLARY: 430 mg/dL — AB (ref 65–99)
GLUCOSE-CAPILLARY: 374 mg/dL — AB (ref 65–99)
Glucose-Capillary: 190 mg/dL — ABNORMAL HIGH (ref 65–99)

## 2016-10-11 MED ORDER — CLOPIDOGREL BISULFATE 75 MG PO TABS
300.0000 mg | ORAL_TABLET | Freq: Once | ORAL | Status: AC
  Administered 2016-10-11: 300 mg via ORAL
  Filled 2016-10-11: qty 4

## 2016-10-11 MED ORDER — CLOPIDOGREL BISULFATE 75 MG PO TABS
75.0000 mg | ORAL_TABLET | Freq: Every day | ORAL | Status: DC
  Administered 2016-10-12 – 2016-10-18 (×7): 75 mg via ORAL
  Filled 2016-10-11 (×7): qty 1

## 2016-10-11 MED ORDER — METOLAZONE 5 MG PO TABS
2.5000 mg | ORAL_TABLET | Freq: Every day | ORAL | Status: AC
  Administered 2016-10-11 – 2016-10-12 (×2): 2.5 mg via ORAL
  Filled 2016-10-11 (×2): qty 1

## 2016-10-11 MED ORDER — INSULIN ASPART 100 UNIT/ML ~~LOC~~ SOLN
4.0000 [IU] | Freq: Three times a day (TID) | SUBCUTANEOUS | Status: DC
  Administered 2016-10-11 – 2016-10-18 (×18): 4 [IU] via SUBCUTANEOUS

## 2016-10-11 MED ORDER — INSULIN GLARGINE 100 UNIT/ML ~~LOC~~ SOLN
30.0000 [IU] | Freq: Every day | SUBCUTANEOUS | Status: DC
  Administered 2016-10-11 – 2016-10-12 (×2): 30 [IU] via SUBCUTANEOUS
  Filled 2016-10-11 (×2): qty 0.3

## 2016-10-11 NOTE — Progress Notes (Signed)
CT Surgery  Functional eval by PT noted With renal failure, R side weakness from CVA, fragility he would notn benefit from CABG rec PCI , medical therapy

## 2016-10-11 NOTE — Progress Notes (Signed)
Patient has developed wheezing and dry mouth. Lasix was administered as scheduled, and mouth care performed. Pt had had 17 beats of wide and complex QRS, two pauses of 1.85 and 1.94 respectively at 0526 and 0535. While sleeping, pt brady to the low 40s/ when awake, pt is  Oriented to place, person, time and situation.No sign of distress noted. Pt answers question approprietly. We will continue to monitor.

## 2016-10-11 NOTE — Care Management Important Message (Signed)
Important Message  Patient Details  Name: Mitchell Garcia MRN: [id number] Date of Birth: [birth year]   Medicare Important Message Given:  Yes    Nathen May 10/11/2016, 10:51 AM

## 2016-10-11 NOTE — Progress Notes (Signed)
[place] to see pt to walk. Has visitors and ask that I come later. Will follow up after lunch if time allows. Graylon Good RN BSN 10/11/2016 10:49 AM

## 2016-10-11 NOTE — Progress Notes (Signed)
[place] for Heparin Indication: chest pain/ACS  Allergies  Allergen Reactions  . Lisinopril     Hyperkalemia   . Losartan     Hyperkalemia     Patient Measurements: Height: 5\' 11"  (180.3 cm) Weight: 251 lb 8.7 oz (114.1 kg) IBW/kg (Calculated) : 75.3 Heparin Dosing Weight: 100kg  Vital Signs: Temp: 97.9 F (36.6 C) (09/17 0920) Temp Source: Oral (09/17 0920) BP: 150/83 (09/17 0920) Pulse Rate: 89 (09/17 0920)  Labs:  Recent Labs  10/09/16 0346 10/10/16 0241 10/11/16 0237 10/11/16 0240  HGB 9.3* 10.2* 9.4*  --   HCT 29.1* 32.1* 29.5*  --   PLT 279 300 301  --   HEPARINUNFRC 0.33 0.50  --  0.48  CREATININE 2.92* 2.94* 2.86*  --    Estimated Creatinine Clearance: 29.5 mL/min (A) (by C-G formula based on SCr of 2.86 mg/dL (H)).  Medications:  Heparin @ 1450 units/hr  Assessment: 71 yo male to continue heparin s/p cardiac cath 10/08/16. Cath showed multivessel disease, CT surgery recommending PCI. Was previously therapeutic on 1450 units/hr. Infusion was restarted 2 hours after TR band was off yesterday at previous rate. CBC stable, HgB 9.4, PLT WNL  Heparin level therapeutic: 0.48 - No signs/symptoms of bleeding noted.     Goal of Therapy:  Heparin level 0.3-0.7 units/ml Monitor platelets by anticoagulation protocol: Yes   Plan:  1) Continue heparin gtt at 1450 units/hr 2) Check daily CBC and heparin levels while on heparin infusion 3) Monitor for s/sx of bleeding  Georga Bora, PharmD Clinical Pharmacist 10/11/2016 9:49 AM

## 2016-10-11 NOTE — Progress Notes (Signed)
[facility] Progress Note    Assessment/ Plan:   1. Acute kidney injury superimposed on CKD Stage 3b-4: Baseline creatninine 2.0-2.5.  Hemodynamically mediated in the setting of new sCHF and NSTEMI likely from coronary ischemia.  Slowly improving over past several days.  Will need to be vigilant moving forward with diuresis and potential repeat contrast exposure for PCI.    2. Acute systolic CHF/NSTEMI: troponins peaked in the 30s.  On hep gtt and nitro gtt.  Cath with 3 vessel disease, not a CABG candidate, might rec PCI.  Lasix 80 IV TID with good effect. Add 2.5mg  metolazone daily to augment diuresis.    3. DM II: A1c 7.6  4.  Ischemic cardiomyopathy: on BB, no ACEi/ARB due to h/o hyperkalemia and renal insufficiency.  BiDil started.  Subjective:    Not a CABG candidate. Still with sig LEE and orthopnea Good UOP on 80 TID IV lasix    Objective:   BP (!) 150/83   Pulse 89   Temp 97.9 F (36.6 C) (Oral)   Resp 18   Ht 5\' 11"  (1.803 m)   Wt 114.1 kg (251 lb 8.7 oz)   SpO2 95%   BMI 35.08 kg/m   Intake/Output Summary (Last 24 hours) at 10/11/16 1133 Last data filed at 10/11/16 3016  Gross per 24 hour  Intake             1781 ml  Output             3150 ml  Net            -1369 ml   Weight change: -2.021 kg (-4 lb 7.3 oz)  Physical Exam: GEN obese, sleeping HEENT EOMI, PERRL NECK JVD improved PULM distant lung sounds, bibasilar crackles still present, somewhat improved CV RRR no m/r/g ABD large pannus EXT 1-2+ pitting edema bilaterally NEURO AAO x 3, R arm deficits MSK: L arm with antecubital infiltration extending up the medial aspect of the arm- no frank hematoma  Imaging: No results found.  Labs: BMET  Recent Labs Lab 10/05/16 0218 10/06/16 0242 10/07/16 0350 10/08/16 0259 10/09/16 0346 10/10/16 0241 10/10/16 2209 10/11/16 0237  NA 137 135 133* 133* 134* 135  --  138  K 3.7 4.1 4.2 4.3 4.3 4.4  --  4.4  CL 106 106 102 102 103  101  --  104  CO2 23 20* 23 24 23 24   --  31  GLUCOSE 194* 211* 238* 219* 203* 243* 400* 265*  BUN 58* 61* 67* 69* 69* 70*  --  68*  CREATININE 3.06* 3.15* 3.01* 2.97* 2.92* 2.94*  --  2.86*  CALCIUM 8.6* 8.2* 8.3* 8.4* 8.5* 8.8*  --  8.6*   CBC  Recent Labs Lab 10/08/16 0259 10/09/16 0346 10/10/16 0241 10/11/16 0237  WBC 10.1 9.0 9.9 10.8*  HGB 9.1* 9.3* 10.2* 9.4*  HCT 28.6* 29.1* 32.1* 29.5*  MCV 90.2 90.7 91.2 91.9  PLT 278 279 300 301    Medications:    . aspirin EC  81 mg Oral Daily  . atorvastatin  80 mg Oral QHS  . carvedilol  3.125 mg Oral BID WC  . furosemide  80 mg Intravenous TID  . gabapentin  300 mg Oral BID  . insulin aspart  0-5 Units Subcutaneous QHS  . insulin aspart  0-9 Units Subcutaneous TID WC  . insulin glargine  30 Units Subcutaneous QHS  . isosorbide-hydrALAZINE  1 tablet Oral BID  . primidone  100 mg Oral Daily  . sodium chloride flush  3 mL Intravenous Q12H      [facility] 10/11/2016, 11:33 AM

## 2016-10-11 NOTE — Progress Notes (Signed)
Progress Note  Patient Name: [facility] Date of Encounter: 10/11/2016  Primary Cardiologist: Angelena Form  Subjective   No chest pain, breathing is stable.   Inpatient Medications    Scheduled Meds: . aspirin EC  81 mg Oral Daily  . atorvastatin  80 mg Oral QHS  . carvedilol  3.125 mg Oral BID WC  . furosemide  80 mg Intravenous TID  . gabapentin  300 mg Oral BID  . insulin aspart  0-5 Units Subcutaneous QHS  . insulin aspart  0-9 Units Subcutaneous TID WC  . insulin glargine  20 Units Subcutaneous QHS  . isosorbide-hydrALAZINE  1 tablet Oral BID  . primidone  100 mg Oral Daily  . sodium chloride flush  3 mL Intravenous Q12H   Continuous Infusions: . sodium chloride    . heparin 1,450 Units/hr (10/11/16 0349)  . nitroGLYCERIN 10 mcg/min (10/11/16 0922)   PRN Meds: sodium chloride, acetaminophen, nitroGLYCERIN, ondansetron (ZOFRAN) IV, sodium chloride flush   Vital Signs    Vitals:   10/10/16 2028 10/10/16 2350 10/11/16 0355 10/11/16 0920  BP: (!) 159/77 137/63 140/64 (!) 150/83  Pulse: 83 (!) 59 (!) 51 89  Resp: 18 18 18    Temp: 98.3 F (36.8 C) 98.1 F (36.7 C) 98.2 F (36.8 C) 97.9 F (36.6 C)  TempSrc: Oral Oral Oral Oral  SpO2: 95% 96% 95% 95%  Weight:   251 lb 8.7 oz (114.1 kg)   Height:        Intake/Output Summary (Last 24 hours) at 10/11/16 1009 Last data filed at 10/11/16 0160  Gross per 24 hour  Intake             1781 ml  Output             3150 ml  Net            -1369 ml   Filed Weights   10/09/16 0400 10/10/16 0415 10/11/16 0355  Weight: 258 lb 6.1 oz (117.2 kg) 256 lb (116.1 kg) 251 lb 8.7 oz (114.1 kg)    Telemetry    SR, with brief episodes of ST - Personally Reviewed  ECG    N/a - Personally Reviewed  Physical Exam   General: Well developed, well nourished, male appearing in no acute distress. Head: Normocephalic, atraumatic.  Neck: Supple, no JVD. Lungs:  Resp regular and unlabored, CTA. Heart: RRR, S1, S2, no S3,  S4, or murmur; no rub. Abdomen: Soft, non-tender, non-distended with normoactive bowel sounds. No hepatomegaly. No rebound/guarding. No obvious abdominal masses. Extremities: No clubbing, cyanosis, 1+ LE edema. Distal pedal pulses are 2+ bilaterally. Neuro: Alert and oriented X 3. Moves all extremities spontaneously. Psych: Normal affect.  Labs    Chemistry Recent Labs Lab 10/09/16 0346 10/10/16 0241 10/10/16 2209 10/11/16 0237  NA 134* 135  --  138  K 4.3 4.4  --  4.4  CL 103 101  --  104  CO2 23 24  --  31  GLUCOSE 203* 243* 400* 265*  BUN 69* 70*  --  68*  CREATININE 2.92* 2.94*  --  2.86*  CALCIUM 8.5* 8.8*  --  8.6*  GFRNONAA 20* 20*  --  20*  GFRAA 23* 23*  --  24*  ANIONGAP 8 10  --  3*     Hematology Recent Labs Lab 10/09/16 0346 10/10/16 0241 10/11/16 0237  WBC 9.0 9.9 10.8*  RBC 3.21* 3.52* 3.21*  HGB 9.3* 10.2* 9.4*  HCT 29.1* 32.1*  29.5*  MCV 90.7 91.2 91.9  MCH 29.0 29.0 29.3  MCHC 32.0 31.8 31.9  RDW 14.0 14.0 14.2  PLT 279 300 301    Cardiac Enzymes Recent Labs Lab 10/04/16 1625 10/04/16 2155 10/05/16 0218  TROPONINI 22.55* 30.58* 21.47*    Recent Labs Lab 10/04/16 1229  TROPIPOC 13.35*     BNP Recent Labs Lab 10/04/16 1210 10/09/16 0346  BNP 943.6* 546.7*     DDimer No results for input(s): DDIMER in the last 168 hours.    Radiology    No results found.  Cardiac Studies   TTE: 10/04/16  Study Conclusions  - Left ventricle: Poor acoustic windows limit studly Defiinity   used. LVEF is approximately 35% with diffuse hypokinesis. The   cavity size was mildly dilated. Wall thickness was increased in a   pattern of mild LVH. Doppler parameters are consistent with   abnormal left ventricular relaxation (grade 1 diastolic   dysfunction). Doppler parameters are consistent with high   ventricular filling pressure.  Cath: 10/08/16  Conclusion   Conclusions: 1. Multivessel coronary artery disease, including 50% proximal  LAD stenosis, 70% proximal D1 lesion, chronic total occlusion of mid LCx, and sequential 80% ostial and 95% distal RCA stenoses. 2. Moderate to severely elevated left ventricular filling pressure.  Recommendations: 1. Cardiac surgery consultation for CABG, given complex multivessel coronary artery disease, diabetes mellitus, and reduced LVEF. If the patient is deemed a poor surgical candidate, consider PCI to RCA (this would likely require atherectomy of the ostial lesion). 2. Restart heparin infusion 4 hours following TR band deflation.  3. Continue diuresis and optimization of heart failure therapy.   Nelva Bush, MD   Patient Profile     71 y.o. male with history of DM, HTN, CVA, HTN, chronic diastolic CHF admitted with dyspnea, CHF.   Assessment & Plan    1. NSTEMI: Admitted with chest pain and dyspnea in setting of volume overload with peak troponin of 30.58. LVEF=35% on echo which is new drop in LVEF - cath with multivessel CAD with 50% prox LAD, 70% D1, CTO LCX, 80% ostial RCA and 95% distal RCA - CT surgery consulted but felt not to be a candidate for CABG. Consider PCI options? - on ASA, atorva, coreg, hep gtt, nitro gtt. No ACE/ARB due to poor renal functio  2. Acute systolic HF: negative 6.3 liters since admission. Fairly stable renal function with diuresis, continue lasix 80mg  IV tid. LVEDP was 33 by cath.  - medical therapy with coreg. No ACE-I/ARB/ARNI/aldactone given poor renal function. Allergy listed to ACE-I and ARBs. - low dose bidil bid started yesterday.  3. AKI on CKD: followed with assistance of nephrology - fairly stable after cath  4. Anemia: Baseline appears around 9-10. Stable currently.  5. IDDM: Hgb A1c 7.6 this admission. CBGs remain elevated. Will further increase Lantus to 30mg  daily.  -- SSI while inpatient  Signed, [name], NP  10/11/2016, 10:09 AM  Pager # [phone]   Patient seen and examined and history reviewed. Agree with  above findings and plan. Patient reports significant orthopnea last night- got down in bed and couldn't breathe. Denies chest pain. On exam he has bilateral pulmonary rales. 2+ edema. No gallop.  I personally reviewed his cardiac cath films. He has 80% ostial RCA stenosis with 95% mid RCA disease. The RCA is a large vessel. There is CTO of the LCx with left to left collaterals. The diagonal is relatively small with 70% stenosis and there  is a 50% mid LAD stenosis. He has been declined for CABG. I think the RCA is amenable to PCI and is critical. Given his CKD I would not attempt to treat the LCx or diagonal disease at this point. He is still volume overloaded and I would like to tune up his CHF a little more before attempting PCI. Will discuss with Nephrology adding low dose metolazone. Will need to watch renal function closely. Once CHF improved and renal function stable will plan on proceeding with PCI. Will need atherectomy of ostial RCA. Continue aggressive medical therapy for now with IV Ntg and heparin. Since surgery is not an option will add P2Y12 inhibitor now.   Gwenith Tschida [place], [place] 10/11/2016 11:43 AM     For questions or updates, please contact [place] Please consult [email or web address] for contact info under Cardiology/STEMI. Daytime calls, contact the Day Call APP (6a-8a) or assigned team (Teams A-D) provider (7:30a - 5p). All other daytime calls (7:30-5p), contact the Card Master @ [phone].   Nighttime calls, contact the assigned APP (5p-8p) or MD (6:30p-8p). Overnight calls (8p-6a), contact the on call Fellow @ [phone].

## 2016-10-11 NOTE — Evaluation (Signed)
Occupational Therapy Evaluation Patient Details Name: [facility] MRN: [id number] DOB: 18-Dec-1945 Today's Date: 10/11/2016    History of Present Illness Mitchell Garcia is a 71 y.o. male with a history of NIDDM, HTN, Stroke, CKD, HTN, chronic diastolic CHF who presented from the [facility] office with progressive dyspnea. NSTEMI   Clinical Impression   Pt admitted with above. He demonstrates the below listed deficits and will benefit from continued OT to maximize safety and independence with BADLs.  Pt presents to OT with generalized weakness, decreased balance, decreased activity tolerance.  He requires min guard assist to complete ADLs and requires rest breaks.  DOE 3/4 with activity, VSS.   Wife is very supportive.  Will follow acutely.  Recommend Cardiac rehab at discharge.       Follow Up Recommendations  Cardiac Rehab    Equipment Recommendations  Tub/shower seat    Recommendations for Other Services       Precautions / Restrictions Precautions Precautions: Fall      Mobility Bed Mobility               General bed mobility comments: Pt sitting EOB   Transfers Overall transfer level: Needs assistance Equipment used: None Transfers: Sit to/from Stand;Stand Pivot Transfers Sit to Stand: Supervision Stand pivot transfers: Supervision            Balance Overall balance assessment: Needs assistance Sitting-balance support: Feet supported Sitting balance-Leahy Scale: Good     Standing balance support: During functional activity Standing balance-Leahy Scale: Fair                             ADL either performed or assessed with clinical judgement   ADL Overall ADL's : Needs assistance/impaired Eating/Feeding: Independent   Grooming: Wash/dry hands;Wash/dry face;Oral care;Brushing hair;Set up;Supervision/safety;Standing   Upper Body Bathing: Set up;Supervision/ safety;Sitting   Lower Body Bathing: Min guard;Sit to/from stand    Upper Body Dressing : Set up;Sitting   Lower Body Dressing: Min guard;Sit to/from stand Lower Body Dressing Details (indicate cue type and reason): DOE 3/4 noted  Toilet Transfer: Min guard;Ambulation;Comfort height toilet;Grab bars   Toileting- Clothing Manipulation and Hygiene: Min guard;Sit to/from stand       Functional mobility during ADLs: Min guard General ADL Comments: Discussed use of shower seat      Vision         Perception     Praxis      Pertinent Vitals/Pain Pain Assessment: No/denies pain     Hand Dominance     Extremity/Trunk Assessment Upper Extremity Assessment Upper Extremity Assessment: LUE deficits/detail LUE Deficits / Details: Pt with limited shoulder flexion due to h/o CVA and fractures of Lt UE in 2014.  AAROM at least to ~90* shoulder flexion  LUE Coordination: decreased gross motor   Lower Extremity Assessment Lower Extremity Assessment: Defer to PT evaluation   Cervical / Trunk Assessment Cervical / Trunk Assessment: Normal   Communication Communication Communication: HOH (speak to R ear)   Cognition Arousal/Alertness: Awake/alert Behavior During Therapy: WFL for tasks assessed/performed Overall Cognitive Status: Within Functional Limits for tasks assessed                                     General Comments  VSS     Exercises     Shoulder Instructions      Home  Living Family/patient expects to be discharged to:: Private residence Living Arrangements: Spouse/significant other Available Help at Discharge: Family;Available 24 hours/day Type of Home: House Home Access: Stairs to enter [employer] of Steps: 2 Entrance Stairs-Rails: None Home Layout: One level     Bathroom Shower/Tub: Occupational psychologist: Handicapped height     Home Equipment: Cane - single point   Additional Comments: Sleeps in recliner chair      Prior Functioning/Environment Level of Independence:  Independent                 OT Problem List: Decreased activity tolerance;Impaired balance (sitting and/or standing);Decreased knowledge of use of DME or AE;Obesity;Impaired UE functional use      OT Treatment/Interventions: Self-care/ADL training;Therapeutic exercise;DME and/or AE instruction;Therapeutic activities;Patient/family education;Balance training;Energy conservation    OT Goals(Current goals can be found in the care plan section) Acute Rehab OT Goals Patient Stated Goal: get back to normal  OT Goal Formulation: With patient/family Time For Goal Achievement: 10/25/16 Potential to Achieve Goals: Good ADL Goals Pt Will Perform Grooming: with modified independence;standing Pt Will Perform Upper Body Bathing: with modified independence;standing Pt Will Perform Lower Body Bathing: with modified independence;sit to/from stand Pt Will Perform Upper Body Dressing: with modified independence;standing;sitting Pt Will Perform Lower Body Dressing: with modified independence;sit to/from stand Pt Will Transfer to Toilet: with modified independence;ambulating;regular height toilet;grab bars;bedside commode Pt Will Perform Toileting - Clothing Manipulation and hygiene: with modified independence;sit to/from stand Pt Will Perform Tub/Shower Transfer: Shower transfer;ambulating;shower seat  OT Frequency: Min 2X/week   Barriers to D/C:            Co-evaluation              AM-PAC PT "6 Clicks" Daily Activity     Outcome Measure Help from another person eating meals?: None Help from another person taking care of personal grooming?: A Little Help from another person toileting, which includes using toliet, bedpan, or urinal?: A Little Help from another person bathing (including washing, rinsing, drying)?: A Little Help from another person to put on and taking off regular upper body clothing?: A Little Help from another person to put on and taking off regular lower body clothing?:  A Little 6 Click Score: 19   End of Session Equipment Utilized During Treatment: Gait belt Nurse Communication: Mobility status  Activity Tolerance: Patient tolerated treatment well Patient left: in bed;with call bell/phone within reach;with family/visitor present (Pt sitting EOB )  OT Visit Diagnosis: Unsteadiness on feet (R26.81)                Time: 7096-2836 OT Time Calculation (min): 35 min Charges:  OT General Charges $OT Visit: 1 Visit OT Evaluation $OT Eval Moderate Complexity: 1 Mod OT Treatments $Self Care/Home Management : 8-22 mins G-Codes:     [facility], OTR/L [phone]   Teyanna Thielman M 10/11/2016, 1:09 PM

## 2016-10-11 NOTE — Progress Notes (Signed)
[place] to see to walk. Pt stated he just walked with rehab and is tired now. Encouraged him to walk with staff later. Will follow up tomorrow. Graylon Good RN BSN 10/11/2016 1:47 PM

## 2016-10-12 DIAGNOSIS — I5023 Acute on chronic systolic (congestive) heart failure: Secondary | ICD-10-CM

## 2016-10-12 LAB — CBC
HEMATOCRIT: 29 % — AB (ref 39.0–52.0)
Hemoglobin: 9.1 g/dL — ABNORMAL LOW (ref 13.0–17.0)
MCH: 28.5 pg (ref 26.0–34.0)
MCHC: 31.4 g/dL (ref 30.0–36.0)
MCV: 90.9 fL (ref 78.0–100.0)
PLATELETS: 323 10*3/uL (ref 150–400)
RBC: 3.19 MIL/uL — AB (ref 4.22–5.81)
RDW: 14.2 % (ref 11.5–15.5)
WBC: 12.2 10*3/uL — AB (ref 4.0–10.5)

## 2016-10-12 LAB — BASIC METABOLIC PANEL
ANION GAP: 9 (ref 5–15)
BUN: 67 mg/dL — AB (ref 6–20)
CO2: 26 mmol/L (ref 22–32)
Calcium: 8.7 mg/dL — ABNORMAL LOW (ref 8.9–10.3)
Chloride: 102 mmol/L (ref 101–111)
Creatinine, Ser: 2.64 mg/dL — ABNORMAL HIGH (ref 0.61–1.24)
GFR calc Af Amer: 26 mL/min — ABNORMAL LOW (ref >60)
GFR calc non Af Amer: 22 mL/min — ABNORMAL LOW (ref >60)
GLUCOSE: 232 mg/dL — AB (ref 65–99)
POTASSIUM: 4 mmol/L (ref 3.5–5.1)
Sodium: 137 mmol/L (ref 135–145)

## 2016-10-12 LAB — HEPARIN LEVEL (UNFRACTIONATED): Heparin Unfractionated: 0.47 IU/mL (ref 0.30–0.70)

## 2016-10-12 LAB — GLUCOSE, CAPILLARY: GLUCOSE-CAPILLARY: 203 mg/dL — AB (ref 65–99)

## 2016-10-12 MED ORDER — SODIUM CHLORIDE 0.9 % IV SOLN: Freq: Once | INTRAVENOUS | Status: AC

## 2016-10-12 MED ORDER — SODIUM CHLORIDE 0.9 % IV SOLN
250.0000 mL | INTRAVENOUS | Status: DC | PRN
  Administered 2016-10-13: 250 mL via INTRAVENOUS

## 2016-10-12 MED ORDER — SODIUM CHLORIDE 0.9% FLUSH: 3.0000 mL | INTRAVENOUS | Status: DC | PRN

## 2016-10-12 MED ORDER — SODIUM CHLORIDE 0.9% FLUSH
3.0000 mL | Freq: Two times a day (BID) | INTRAVENOUS | Status: DC
  Administered 2016-10-12 – 2016-10-13 (×2): 3 mL via INTRAVENOUS

## 2016-10-12 MED ORDER — ASPIRIN 81 MG PO CHEW
81.0000 mg | CHEWABLE_TABLET | ORAL | Status: AC
  Administered 2016-10-13: 81 mg via ORAL
  Filled 2016-10-12: qty 1

## 2016-10-12 NOTE — Progress Notes (Signed)
Progress Note  Patient Name: Mitchell Garcia Date of Encounter: 10/12/2016  Primary Cardiologist: Angelena Form  Subjective   Up ready to walk with PT in the hallway. Feeling well.   Inpatient Medications    Scheduled Meds: . aspirin EC  81 mg Oral Daily  . atorvastatin  80 mg Oral QHS  . carvedilol  3.125 mg Oral BID WC  . clopidogrel  75 mg Oral Daily  . furosemide  80 mg Intravenous TID  . gabapentin  300 mg Oral BID  . insulin aspart  0-5 Units Subcutaneous QHS  . insulin aspart  0-9 Units Subcutaneous TID WC  . insulin aspart  4 Units Subcutaneous TID WC  . insulin glargine  30 Units Subcutaneous QHS  . isosorbide-hydrALAZINE  1 tablet Oral BID  . metolazone  2.5 mg Oral Daily  . primidone  100 mg Oral Daily  . sodium chloride flush  3 mL Intravenous Q12H   Continuous Infusions: . sodium chloride    . heparin 1,450 Units/hr (10/11/16 1937)  . nitroGLYCERIN 10 mcg/min (10/11/16 0922)   PRN Meds: sodium chloride, acetaminophen, nitroGLYCERIN, ondansetron (ZOFRAN) IV, sodium chloride flush   Vital Signs    Vitals:   10/12/16 0015 10/12/16 0455 10/12/16 0457 10/12/16 0929  BP: 139/74  133/69 (!) 153/87  Pulse: (!) 55  (!) 52 67  Resp:      Temp:  98.3 F (36.8 C)    TempSrc:  Oral    SpO2: 97%  94% 97%  Weight:  251 lb 12.3 oz (114.2 kg)    Height:        Intake/Output Summary (Last 24 hours) at 10/12/16 1123 Last data filed at 10/12/16 1029  Gross per 24 hour  Intake          1659.96 ml  Output             4800 ml  Net         -3140.04 ml   Filed Weights   10/10/16 0415 10/11/16 0355 10/12/16 0455  Weight: 256 lb (116.1 kg) 251 lb 8.7 oz (114.1 kg) 251 lb 12.3 oz (114.2 kg)    Telemetry    SR with PVCs - Personally Reviewed  ECG    N/a - Personally Reviewed  Physical Exam   General: Older, very pleasant W male appearing in no acute distress. Head: Normocephalic, atraumatic.  Neck: Supple without bruits, JVD. Lungs:  Resp regular and  unlabored, diminished with crackles in lower lobes. Heart: RRR, S1, S2, no S3, S4, or murmur; no rub. Abdomen: Soft, non-tender, non-distended with normoactive bowel sounds. No hepatomegaly. No rebound/guarding. No obvious abdominal masses. Extremities: No clubbing, cyanosis, 1+ edema. Distal pedal pulses are 2+ bilaterally. Neuro: Alert and oriented X 3. Moves all extremities spontaneously. Psych: Normal affect.  Labs    Chemistry Recent Labs Lab 10/10/16 0241 10/10/16 2209 10/11/16 0237 10/12/16 0320  NA 135  --  138 137  K 4.4  --  4.4 4.0  CL 101  --  104 102  CO2 24  --  31 26  GLUCOSE 243* 400* 265* 232*  BUN 70*  --  68* 67*  CREATININE 2.94*  --  2.86* 2.64*  CALCIUM 8.8*  --  8.6* 8.7*  GFRNONAA 20*  --  20* 22*  GFRAA 23*  --  24* 26*  ANIONGAP 10  --  3* 9     Hematology Recent Labs Lab 10/10/16 0241 10/11/16 0237 10/12/16 0320  WBC  9.9 10.8* 12.2*  RBC 3.52* 3.21* 3.19*  HGB 10.2* 9.4* 9.1*  HCT 32.1* 29.5* 29.0*  MCV 91.2 91.9 90.9  MCH 29.0 29.3 28.5  MCHC 31.8 31.9 31.4  RDW 14.0 14.2 14.2  PLT 300 301 323    Cardiac EnzymesNo results for input(s): TROPONINI in the last 168 hours. No results for input(s): TROPIPOC in the last 168 hours.   BNP Recent Labs Lab 10/09/16 0346  BNP 546.7*     DDimer No results for input(s): DDIMER in the last 168 hours.    Radiology    No results found.  Cardiac Studies   TTE: 10/04/16  Study Conclusions  - Left ventricle: Poor acoustic windows limit studly Defiinity used. LVEF is approximately 35% with diffuse hypokinesis. The cavity size was mildly dilated. Wall thickness was increased in a pattern of mild LVH. Doppler parameters are consistent with abnormal left ventricular relaxation (grade 1 diastolic dysfunction). Doppler parameters are consistent with high ventricular filling pressure.  Cath: 10/08/16  Conclusion   Conclusions: 1. Multivessel coronary artery disease,  including 50% proximal LAD stenosis, 70% proximal D1 lesion, chronic total occlusion of mid LCx, and sequential 80% ostial and 95% distal RCA stenoses. 2. Moderate to severely elevated left ventricular filling pressure.  Recommendations: 1. Cardiac surgery consultation for CABG, given complex multivessel coronary artery disease, diabetes mellitus, and reduced LVEF. If the patient is deemed a poor surgical candidate, consider PCI to RCA (this would likely require atherectomy of the ostial lesion). 2. Restart heparin infusion 4 hours following TR band deflation.  3. Continue diuresis and optimization of heart failure therapy.   Nelva Bush, MD    Patient Profile     71 y.o. male with history of DM, HTN, CVA, HTN, chronic diastolic CHF admitted with dyspnea, and CHF. Underwent cath with multivessel disease. Not a surgical candidate, planned for PCI.    Assessment & Plan    1. NSTEMI: Admitted with chest pain and dyspnea in setting of volume overload with peak troponin of 30.58. LVEF=35% on echo which is new drop in LVEF - cath with multivessel CAD with 50% prox LAD, 70% D1, CTO LCX, 80% ostial RCA and 95% distal RCA - CT surgery consulted but felt not to be a candidate for CABG. Films reviewed by interventionalists and planned for atherectomy of RCA tomorrow with Dr. Angelena Form given labs are stable.  - on ASA, atorva, coreg, hep gtt, nitro gtt. No ACE/ARB due to poor renal functio  2. Acute systolic HF: negative 2.5ENIDPO since admission. Fairly stable renal function with diuresis, actually mildly improved today. Responded well to metolazone.  - medical therapy with coreg and Isordil. No ACE-I/ARB/ARNI/aldactone given poor renal function. Allergy listed to ACE-I and ARBs. - will hold diuretic in the am with plans for cath.   3. AKI on CKD: followed with assistance of nephrology - fairly stable after cath, mildly improved today.   4. Anemia: Baseline appears around 9-10. Stable  currently.  5. IDDM: Hgb A1c 7.6 this admission. CBGs have been elevated this admission. Increased Lantus to 30mg  daily. Added 4 units TID with meals to SSI yesterday. CBG improved this morning.  - SSI while inpatient   Signed, [name], NP  10/12/2016, 11:23 AM  Pager # [phone]   Patient seen and examined and history reviewed. Agree with above findings and plan. He is feeling better. Less dyspnea. Good diuretic response to metolazone and creatinine is coming down. Still volume overloaded. Will continue current diuresis today. Plan  to proceed with PCI tomorrow with Dr. Angelena Form. Will hold diuretics in am for procedure.   Peter Martinique, [place] 10/12/2016 1:01 PM     For questions or updates, please contact [facility] Please consult [email or web address] for contact info under Cardiology/STEMI. Daytime calls, contact the Day Call APP (6a-8a) or assigned team (Teams A-D) provider (7:30a - 5p). All other daytime calls (7:30-5p), contact the Card Master @ [phone].   Nighttime calls, contact the assigned APP (5p-8p) or MD (6:30p-8p). Overnight calls (8p-6a), contact the on call Fellow @ [phone].

## 2016-10-12 NOTE — Progress Notes (Signed)
[facility] Progress Note    Assessment/ Plan:   1. Acute kidney injury superimposed on CKD Stage 3b-4: Baseline creatninine 2.0-2.5.  Slowly improving over past several days.  Will need to be vigilant moving forward with diuresis and potential repeat contrast exposure for PCI.  I would hold AM diuretics day of IV contrast exposure for PCI.    2. Acute systolic CHF/NSTEMI: troponins peaked in the 30s.  On hep gtt and nitro gtt.  Cath with 3 vessel disease, not a CABG candidate, might rec PCI.  Lasix 80 IV TID with good effect. Added  2.5mg  metolazone daily to augment diuresis and is effective thus far.  .    3. DM II: A1c 7.6  4.  Ischemic cardiomyopathy: on BB, no ACEi/ARB due to h/o hyperkalemia and renal insufficiency.  BiDil started.  Subjective:    Stable GFR and increased UOP with addition of metolazone. Less PND / Orthopnea this AM, was 2L Net negative    Objective:   BP 133/69 (BP Location: Left Arm)   Pulse (!) 52   Temp 98.3 F (36.8 C) (Oral)   Resp 18   Ht 5\' 11"  (1.803 m)   Wt 114.2 kg (251 lb 12.3 oz)   SpO2 94%   BMI 35.11 kg/m   Intake/Output Summary (Last 24 hours) at 10/12/16 0902 Last data filed at 10/12/16 0600  Gross per 24 hour  Intake          1299.96 ml  Output             3750 ml  Net         -2450.04 ml   Weight change: 0.1 kg (3.5 oz)  Physical Exam: GEN NAD HEENT EOMI PULM diminishedi n bases CV RRR no m/r/g ABD large pannus EXT 1-2+ pitting edema bilaterally NEURO AAO x 3, R arm deficits  Labs: BMET  Recent Labs Lab 10/06/16 0242 10/07/16 0350 10/08/16 0259 10/09/16 0346 10/10/16 0241 10/10/16 2209 10/11/16 0237 10/12/16 0320  NA 135 133* 133* 134* 135  --  138 137  K 4.1 4.2 4.3 4.3 4.4  --  4.4 4.0  CL 106 102 102 103 101  --  104 102  CO2 20* 23 24 23 24   --  31 26  GLUCOSE 211* 238* 219* 203* 243* 400* 265* 232*  BUN 61* 67* 69* 69* 70*  --  68* 67*  CREATININE 3.15* 3.01* 2.97* 2.92* 2.94*  --   2.86* 2.64*  CALCIUM 8.2* 8.3* 8.4* 8.5* 8.8*  --  8.6* 8.7*   CBC  Recent Labs Lab 10/09/16 0346 10/10/16 0241 10/11/16 0237 10/12/16 0320  WBC 9.0 9.9 10.8* 12.2*  HGB 9.3* 10.2* 9.4* 9.1*  HCT 29.1* 32.1* 29.5* 29.0*  MCV 90.7 91.2 91.9 90.9  PLT 279 300 301 323    Medications:    . aspirin EC  81 mg Oral Daily  . atorvastatin  80 mg Oral QHS  . carvedilol  3.125 mg Oral BID WC  . clopidogrel  75 mg Oral Daily  . furosemide  80 mg Intravenous TID  . gabapentin  300 mg Oral BID  . insulin aspart  0-5 Units Subcutaneous QHS  . insulin aspart  0-9 Units Subcutaneous TID WC  . insulin aspart  4 Units Subcutaneous TID WC  . insulin glargine  30 Units Subcutaneous QHS  . isosorbide-hydrALAZINE  1 tablet Oral BID  . metolazone  2.5 mg Oral Daily  . primidone  100 mg  Oral Daily  . sodium chloride flush  3 mL Intravenous Q12H      [facility] 10/12/2016, 9:02 AM

## 2016-10-12 NOTE — Progress Notes (Signed)
[place] for Heparin Indication: chest pain/ACS  Allergies  Allergen Reactions  . Lisinopril     Hyperkalemia   . Losartan     Hyperkalemia    Patient Measurements: Height: 5\' 11"  (180.3 cm) Weight: 251 lb 12.3 oz (114.2 kg) IBW/kg (Calculated) : 75.3 Heparin Dosing Weight: 100kg  Vital Signs: Temp: 98.3 F (36.8 C) (09/18 0455) Temp Source: Oral (09/18 0455) BP: 153/87 (09/18 0929) Pulse Rate: 67 (09/18 0929)  Labs:  Recent Labs  10/10/16 0241 10/11/16 0237 10/11/16 0240 10/12/16 0320  HGB 10.2* 9.4*  --  9.1*  HCT 32.1* 29.5*  --  29.0*  PLT 300 301  --  323  HEPARINUNFRC 0.50  --  0.48 0.47  CREATININE 2.94* 2.86*  --  2.64*   Estimated Creatinine Clearance: 32 mL/min (A) (by C-G formula based on SCr of 2.64 mg/dL (H)).  Medications:  Heparin @ 1450 units/hr  Assessment: 71 yo male to continue heparin s/p cardiac cath 10/08/16. Cath showed multivessel disease, CT surgery recommending PCI.  CBC stable, HgB 9.1, PLT WNL  Heparin level therapeutic: 0.47 - No signs/symptoms of bleeding noted.     Goal of Therapy:  Heparin level 0.3-0.7 units/ml Monitor platelets by anticoagulation protocol: Yes   Plan:  1) Continue heparin gtt at 1450 units/hr 2) Check daily CBC and heparin levels while on heparin infusion 3) Monitor for s/sx of bleeding  Georga Bora, PharmD Clinical Pharmacist 10/12/2016 10:34 AM

## 2016-10-12 NOTE — Progress Notes (Addendum)
Physical Therapy Treatment Patient Details Name: Mitchell Garcia MRN: [id number] DOB: 24-Oct-1945 Today's Date: 10/12/2016    History of Present Illness Mitchell Garcia is a 71 y.o. male with a history of NIDDM, HTN, Stroke, CKD, HTN, chronic diastolic CHF who presented from the [facility] office with progressive dyspnea. NSTEMI    PT Comments    Patient tolerated increased gait distance and stair negotiation well with c/o feeling "a little winded". VSS throughout session on RA. Pt will need to practice stairs again prior to d/c home. Current plan remains appropriate.    Follow Up Recommendations  Outpatient PT;Other (comment) (Cardiac Rehab)     Equipment Recommendations  Cane    Recommendations for Other Services OT consult (for ADLs)     Precautions / Restrictions Precautions Precautions: Fall    Mobility  Bed Mobility Overal bed mobility: Modified Independent             General bed mobility comments: increased time and effort; use of rail and HOB elevated; toward L side due to R UE weakness and limited ROM; educated on technique  Transfers Overall transfer level: Needs assistance Equipment used: None Transfers: Sit to/from [facility] Sit to Stand: Supervision         General transfer comment: supervision for safety  Ambulation/Gait Ambulation/Gait assistance: Min guard Ambulation Distance (Feet): 250 Feet Assistive device: None Gait Pattern/deviations: Step-through pattern;Decreased step length - right;Decreased step length - left;Wide base of support Gait velocity: decreased   General Gait Details: pt with grossly steady gait but with guarded movements; pt was able to tolerate horizontal head turns without LOB    Stairs Stairs: Yes   Stair Management: One rail Left;Step to pattern;Forwards Number of Stairs: 3 General stair comments: min guard for safety  Wheelchair Mobility    Modified Rankin (Stroke Patients Only)        Balance Overall balance assessment: Needs assistance Sitting-balance support: Feet supported Sitting balance-Leahy Scale: Good     Standing balance support: During functional activity Standing balance-Leahy Scale: Fair                              Cognition Arousal/Alertness: Awake/alert Behavior During Therapy: WFL for tasks assessed/performed Overall Cognitive Status: Within Functional Limits for tasks assessed                                        Exercises      General Comments General comments (skin integrity, edema, etc.): pt reported feeling "a little winded" post stair negotiation; VSS throughout session      Pertinent Vitals/Pain Pain Assessment: No/denies pain    Home Living                      Prior Function            PT Goals (current goals can now be found in the care plan section) Acute Rehab PT Goals PT Goal Formulation: With patient Time For Goal Achievement: 10/24/16 Potential to Achieve Goals: Good Progress towards PT goals: Progressing toward goals    Frequency    Min 3X/week      PT Plan Current plan remains appropriate    Co-evaluation              AM-PAC PT "6 Clicks" Daily Activity  Outcome Measure  Difficulty turning over in bed (including adjusting bedclothes, sheets and blankets)?: A Lot Difficulty moving from lying on back to sitting on the side of the bed? : A Lot Difficulty sitting down on and standing up from a chair with arms (e.g., wheelchair, bedside commode, etc,.)?: A Little Help needed moving to and from a bed to chair (including a wheelchair)?: A Little Help needed walking in hospital room?: A Little Help needed climbing 3-5 steps with a railing? : A Little 6 Click Score: 16    End of Session Equipment Utilized During Treatment: Gait belt Activity Tolerance: Patient tolerated treatment well Patient left: with call bell/phone within reach;Other (comment);with  family/visitor present (sittign EOB) Nurse Communication: Mobility status PT Visit Diagnosis: Other abnormalities of gait and mobility (R26.89)     Time: 7482-7078 PT Time Calculation (min) (ACUTE ONLY): 27 min  Charges:  $Gait Training: 23-37 mins                    G Codes:       Earney Navy, PTA Pager: [phone]     Darliss Cheney 10/12/2016, 11:42 AM

## 2016-10-12 NOTE — Progress Notes (Signed)
Inpatient Diabetes Program Recommendations  AACE/ADA: New Consensus Statement on Inpatient Glycemic Control (2015)  Target Ranges:  Prepandial:   less than 140 mg/dL      Peak postprandial:   less than 180 mg/dL (1-2 hours)      Critically ill patients:  140 - 180 mg/dL   Results for Mitchell Garcia, Mitchell Garcia (MRN [id number]) as of 10/12/2016 09:38  Ref. Range 10/11/2016 06:16 10/11/2016 11:52 10/11/2016 16:20 10/12/2016 06:22  Glucose-Capillary Latest Ref Range: 65 - 99 mg/dL 190 (H) 430 (H) 374 (H) 203 (H)   Review of Glycemic Control  Diabetes history: DM 2 Outpatient Diabetes medications: 70/30 95 units BID Current orders for Inpatient glycemic control: Lantus 30 units QHS, Novolog Sensitive 0-9 units tid + Novolog 0-5 units QHS, Novolog 4 units tid  Inpatient Diabetes Program Recommendations:    Glucose levels trending downward, still elevated in the low 200 range. If patient remains inpatient, consider increasing Lantus to 36 units.   Hgb A1c 7.6%  Thanks,  Tama Headings RN, MSN, [facility] Inpatient Diabetes Coordinator Team Pager [phone] (8a-5p)

## 2016-10-13 ENCOUNTER — Encounter (HOSPITAL_COMMUNITY): Payer: Self-pay | Admitting: Cardiovascular Disease

## 2016-10-13 ENCOUNTER — Encounter (HOSPITAL_COMMUNITY)
Admission: EM | Disposition: A | Discharge status: Home / Self Care | Payer: Self-pay | Source: Home / Self Care | Attending: Cardiovascular Disease

## 2016-10-13 DIAGNOSIS — N189 Chronic kidney disease, unspecified: Secondary | ICD-10-CM

## 2016-10-13 DIAGNOSIS — D638 Anemia in other chronic diseases classified elsewhere: Secondary | ICD-10-CM

## 2016-10-13 HISTORY — PX: CORONARY STENT INTERVENTION: CATH118234

## 2016-10-13 HISTORY — PX: TEMPORARY PACEMAKER: CATH118268

## 2016-10-13 LAB — BASIC METABOLIC PANEL
ANION GAP: 10 (ref 5–15)
BUN: 69 mg/dL — ABNORMAL HIGH (ref 6–20)
CO2: 28 mmol/L (ref 22–32)
Calcium: 8.8 mg/dL — ABNORMAL LOW (ref 8.9–10.3)
Chloride: 98 mmol/L — ABNORMAL LOW (ref 101–111)
Creatinine, Ser: 2.65 mg/dL — ABNORMAL HIGH (ref 0.61–1.24)
GFR, EST AFRICAN AMERICAN: 26 mL/min — AB (ref >60)
GFR, EST NON AFRICAN AMERICAN: 22 mL/min — AB (ref >60)
GLUCOSE: 203 mg/dL — AB (ref 65–99)
POTASSIUM: 3.9 mmol/L (ref 3.5–5.1)
Sodium: 136 mmol/L (ref 135–145)

## 2016-10-13 LAB — CBC
HCT: 29.8 % — ABNORMAL LOW (ref 39.0–52.0)
Hemoglobin: 9.4 g/dL — ABNORMAL LOW (ref 13.0–17.0)
MCH: 28.5 pg (ref 26.0–34.0)
MCHC: 31.5 g/dL (ref 30.0–36.0)
MCV: 90.3 fL (ref 78.0–100.0)
PLATELETS: 316 10*3/uL (ref 150–400)
RBC: 3.3 MIL/uL — AB (ref 4.22–5.81)
RDW: 14.1 % (ref 11.5–15.5)
WBC: 11 10*3/uL — AB (ref 4.0–10.5)

## 2016-10-13 LAB — GLUCOSE, CAPILLARY
GLUCOSE-CAPILLARY: 119 mg/dL — AB (ref 65–99)
Glucose-Capillary: 294 mg/dL — ABNORMAL HIGH (ref 65–99)

## 2016-10-13 LAB — HEPARIN LEVEL (UNFRACTIONATED): Heparin Unfractionated: 0.45 IU/mL (ref 0.30–0.70)

## 2016-10-13 LAB — POCT ACTIVATED CLOTTING TIME: Activated Clotting Time: 621 seconds

## 2016-10-13 SURGERY — CORONARY STENT INTERVENTION
Anesthesia: LOCAL

## 2016-10-13 MED ORDER — BIVALIRUDIN BOLUS VIA INFUSION - CUPID
INTRAVENOUS | Status: DC | PRN
  Administered 2016-10-13: 84.825 mg via INTRAVENOUS

## 2016-10-13 MED ORDER — NITROGLYCERIN 1 MG/10 ML FOR IR/CATH LAB
INTRA_ARTERIAL | Status: AC
  Filled 2016-10-13: qty 10

## 2016-10-13 MED ORDER — FENTANYL CITRATE (PF) 100 MCG/2ML IJ SOLN
INTRAMUSCULAR | Status: AC
  Filled 2016-10-13: qty 2

## 2016-10-13 MED ORDER — SODIUM CHLORIDE 0.9% FLUSH: 3.0000 mL | INTRAVENOUS | Status: DC | PRN

## 2016-10-13 MED ORDER — HEART ATTACK BOUNCING BOOK
Freq: Once | Status: AC
  Administered 2016-10-13: 21:00:00
  Filled 2016-10-13: qty 1

## 2016-10-13 MED ORDER — MIDAZOLAM HCL 2 MG/2ML IJ SOLN
INTRAMUSCULAR | Status: AC
  Filled 2016-10-13: qty 2

## 2016-10-13 MED ORDER — IOPAMIDOL (ISOVUE-370) INJECTION 76%
INTRAVENOUS | Status: AC
  Filled 2016-10-13: qty 100

## 2016-10-13 MED ORDER — HYDRALAZINE HCL 20 MG/ML IJ SOLN
5.0000 mg | INTRAMUSCULAR | Status: AC | PRN
  Administered 2016-10-13: 17:00:00 5 mg via INTRAVENOUS
  Filled 2016-10-13: qty 1

## 2016-10-13 MED ORDER — HEPARIN (PORCINE) IN NACL 2-0.9 UNIT/ML-% IJ SOLN
INTRAMUSCULAR | Status: AC | PRN
  Administered 2016-10-13: 1000 mL

## 2016-10-13 MED ORDER — SODIUM CHLORIDE 0.9 % IV SOLN
INTRAVENOUS | Status: AC
  Administered 2016-10-13: 13:00:00 via INTRAVENOUS

## 2016-10-13 MED ORDER — SODIUM CHLORIDE 0.9 % IV SOLN
INTRAVENOUS | Status: AC | PRN
  Administered 2016-10-13: 10 mL/h via INTRAVENOUS

## 2016-10-13 MED ORDER — ANGIOPLASTY BOOK
Freq: Once | Status: AC
  Administered 2016-10-13: 21:00:00
  Filled 2016-10-13: qty 1

## 2016-10-13 MED ORDER — IOPAMIDOL (ISOVUE-370) INJECTION 76%
INTRAVENOUS | Status: AC
  Filled 2016-10-13: qty 125

## 2016-10-13 MED ORDER — NITROGLYCERIN 1 MG/10 ML FOR IR/CATH LAB
INTRA_ARTERIAL | Status: DC | PRN
  Administered 2016-10-13 (×2): 200 ug via INTRACORONARY

## 2016-10-13 MED ORDER — LABETALOL HCL 5 MG/ML IV SOLN
10.0000 mg | INTRAVENOUS | Status: AC | PRN
Start: 2016-10-13 — End: 2016-10-13

## 2016-10-13 MED ORDER — LIDOCAINE HCL (PF) 1 % IJ SOLN
INTRAMUSCULAR | Status: DC | PRN
  Administered 2016-10-13: 15 mL

## 2016-10-13 MED ORDER — SODIUM CHLORIDE 0.9 % IV SOLN
INTRAVENOUS | Status: DC | PRN
  Administered 2016-10-13: 12:00:00 via SURGICAL_CAVITY

## 2016-10-13 MED ORDER — LIDOCAINE HCL 2 % IJ SOLN
INTRAMUSCULAR | Status: AC
  Filled 2016-10-13: qty 10

## 2016-10-13 MED ORDER — MIDAZOLAM HCL 2 MG/2ML IJ SOLN
INTRAMUSCULAR | Status: DC | PRN
  Administered 2016-10-13 (×2): 1 mg via INTRAVENOUS

## 2016-10-13 MED ORDER — FENTANYL CITRATE (PF) 100 MCG/2ML IJ SOLN
INTRAMUSCULAR | Status: DC | PRN
  Administered 2016-10-13 (×2): 25 ug via INTRAVENOUS

## 2016-10-13 MED ORDER — BIVALIRUDIN TRIFLUOROACETATE 250 MG IV SOLR
INTRAVENOUS | Status: AC
  Filled 2016-10-13: qty 250

## 2016-10-13 MED ORDER — SODIUM CHLORIDE 0.9% FLUSH
3.0000 mL | Freq: Two times a day (BID) | INTRAVENOUS | Status: DC
  Administered 2016-10-13 – 2016-10-17 (×7): 3 mL via INTRAVENOUS

## 2016-10-13 MED ORDER — SODIUM CHLORIDE 0.9 % IV SOLN
INTRAVENOUS | Status: DC | PRN
  Administered 2016-10-13: 1.75 mg/kg/h via INTRAVENOUS
  Administered 2016-10-13: 1.75 mg/kg/h

## 2016-10-13 MED ORDER — SODIUM CHLORIDE 0.9 % IV SOLN: 250.0000 mL | INTRAVENOUS | Status: DC | PRN

## 2016-10-13 MED ORDER — INSULIN GLARGINE 100 UNIT/ML ~~LOC~~ SOLN
35.0000 [IU] | Freq: Every day | SUBCUTANEOUS | Status: DC
  Administered 2016-10-13 – 2016-10-17 (×4): 35 [IU] via SUBCUTANEOUS
  Filled 2016-10-13 (×6): qty 0.35

## 2016-10-13 MED ORDER — HEPARIN (PORCINE) IN NACL 2-0.9 UNIT/ML-% IJ SOLN
INTRAMUSCULAR | Status: AC
  Filled 2016-10-13: qty 1000

## 2016-10-13 MED ORDER — IOPAMIDOL (ISOVUE-370) INJECTION 76%
INTRAVENOUS | Status: DC | PRN
  Administered 2016-10-13: 155 mL via INTRA_ARTERIAL

## 2016-10-13 SURGICAL SUPPLY — 33 items
BALLN MINITREK OTW 1.5X12 (BALLOONS) ×2
BALLN SAPPHIRE 2.5X15 (BALLOONS) ×2
BALLN SAPPHIRE 3.5X15 (BALLOONS) ×2
BALLN SAPPHIRE ~~LOC~~ 3.25X15 (BALLOONS) ×1 IMPLANT
BALLN ~~LOC~~ EMERGE MR 4.5X12 (BALLOONS) ×2
BALLOON MINITREK OTW 1.5X12 (BALLOONS) IMPLANT
BALLOON SAPPHIRE 2.5X15 (BALLOONS) IMPLANT
BALLOON SAPPHIRE 3.5X15 (BALLOONS) IMPLANT
BALLOON ~~LOC~~ EMERGE MR 4.5X12 (BALLOONS) IMPLANT
CABLE ADAPT CONN TEMP 6FT (ADAPTER) ×1 IMPLANT
CATH GUIDEZILLA II 6F (CATHETERS) IMPLANT
CATH MICROGUIDE FINCRSS 150 CM (MICROCATHETER) IMPLANT
CATH S G BIP PACING (SET/KITS/TRAYS/PACK) ×1 IMPLANT
CATH VISTA GUIDE 6FR JR4 (CATHETERS) ×1 IMPLANT
CATHETER GUIDEZILLA II 6F (CATHETERS) ×2
CROWN DIAMONDBACK CLASSIC 1.25 (BURR) ×1 IMPLANT
DEVICE CONTINUOUS FLUSH (MISCELLANEOUS) ×1 IMPLANT
ELECT DEFIB PAD ADLT CADENCE (PAD) ×1 IMPLANT
KIT ENCORE 26 ADVANTAGE (KITS) ×2 IMPLANT
KIT HEART LEFT (KITS) ×2 IMPLANT
LUBRICANT VIPERSLIDE CORONARY (MISCELLANEOUS) ×1 IMPLANT
MICROGUIDE FINECROSS 150 CM (MICROCATHETER) ×2
PACK CARDIAC CATHETERIZATION (CUSTOM PROCEDURE TRAY) ×2 IMPLANT
SHEATH PINNACLE 6F 10CM (SHEATH) ×2 IMPLANT
STENT SYNERGY DES 3X20 (Permanent Stent) ×1 IMPLANT
STENT SYNERGY DES 4X16 (Permanent Stent) ×1 IMPLANT
TRANSDUCER W/STOPCOCK (MISCELLANEOUS) ×2 IMPLANT
TUBING CIL FLEX 10 FLL-RA (TUBING) ×2 IMPLANT
WIRE COUGAR XT STRL 300CM (WIRE) ×1 IMPLANT
WIRE DOC EXTENSION .014X145CM (WIRE) ×1 IMPLANT
WIRE EMERALD 3MM-J .035X150CM (WIRE) ×1 IMPLANT
WIRE MAILMAN 300CM (WIRE) ×1 IMPLANT
WIRE VIPER ADVANCE COR .012TIP (WIRE) ×1 IMPLANT

## 2016-10-13 NOTE — Progress Notes (Signed)
[place] for Heparin Indication: chest pain/ACS  Allergies  Allergen Reactions  . Lisinopril     Hyperkalemia   . Losartan     Hyperkalemia    Patient Measurements: Height: 5\' 11"  (180.3 cm) Weight: 249 lb 5.4 oz (113.1 kg) IBW/kg (Calculated) : 75.3 Heparin Dosing Weight: 100kg  Vital Signs: Temp: 97.8 F (36.6 C) (09/19 0806) Temp Source: Oral (09/19 0806) BP: 138/70 (09/19 0806) Pulse Rate: 65 (09/19 0806)  Labs:  Recent Labs  10/11/16 0237 10/11/16 0240 10/12/16 0320 10/13/16 0248  HGB 9.4*  --  9.1* 9.4*  HCT 29.5*  --  29.0* 29.8*  PLT 301  --  323 316  HEPARINUNFRC  --  0.48 0.47 0.45  CREATININE 2.86*  --  2.64* 2.65*   Estimated Creatinine Clearance: 31.7 mL/min (A) (by C-G formula based on SCr of 2.65 mg/dL (H)).  Medications:  Heparin @ 1450 units/hr  Assessment: 71 yo male to continue heparin s/p cardiac cath 10/08/16. Cath showed multivessel disease, CT surgery preforming atherectomy of RCA at this time.  CBC stable, HgB 9.1, PLT WNL.   Heparin level therapeutic with morning labs: 0.45 - No signs/symptoms of bleeding noted.     Goal of Therapy:  Heparin level 0.3-0.7 units/ml Monitor platelets by anticoagulation protocol: Yes   Plan:  1) Continue heparin gtt at 1450 units/hr 2) Check daily CBC and heparin levels while on heparin infusion 3) Monitor for s/sx of bleeding 4) F/U s/p atherectomy  Georga Bora, PharmD Clinical Pharmacist 10/13/2016 10:52 AM

## 2016-10-13 NOTE — H&P (View-Only) (Signed)
Progress Note  Patient Name: Mitchell Garcia Date of Encounter: 10/12/2016  Primary Cardiologist: Angelena Form  Subjective   Up ready to walk with PT in the hallway. Feeling well.   Inpatient Medications    Scheduled Meds: . aspirin EC  81 mg Oral Daily  . atorvastatin  80 mg Oral QHS  . carvedilol  3.125 mg Oral BID WC  . clopidogrel  75 mg Oral Daily  . furosemide  80 mg Intravenous TID  . gabapentin  300 mg Oral BID  . insulin aspart  0-5 Units Subcutaneous QHS  . insulin aspart  0-9 Units Subcutaneous TID WC  . insulin aspart  4 Units Subcutaneous TID WC  . insulin glargine  30 Units Subcutaneous QHS  . isosorbide-hydrALAZINE  1 tablet Oral BID  . metolazone  2.5 mg Oral Daily  . primidone  100 mg Oral Daily  . sodium chloride flush  3 mL Intravenous Q12H   Continuous Infusions: . sodium chloride    . heparin 1,450 Units/hr (10/11/16 1937)  . nitroGLYCERIN 10 mcg/min (10/11/16 0922)   PRN Meds: sodium chloride, acetaminophen, nitroGLYCERIN, ondansetron (ZOFRAN) IV, sodium chloride flush   Vital Signs    Vitals:   10/12/16 0015 10/12/16 0455 10/12/16 0457 10/12/16 0929  BP: 139/74  133/69 (!) 153/87  Pulse: (!) 55  (!) 52 67  Resp:      Temp:  98.3 F (36.8 C)    TempSrc:  Oral    SpO2: 97%  94% 97%  Weight:  251 lb 12.3 oz (114.2 kg)    Height:        Intake/Output Summary (Last 24 hours) at 10/12/16 1123 Last data filed at 10/12/16 1029  Gross per 24 hour  Intake          1659.96 ml  Output             4800 ml  Net         -3140.04 ml   Filed Weights   10/10/16 0415 10/11/16 0355 10/12/16 0455  Weight: 256 lb (116.1 kg) 251 lb 8.7 oz (114.1 kg) 251 lb 12.3 oz (114.2 kg)    Telemetry    SR with PVCs - Personally Reviewed  ECG    N/a - Personally Reviewed  Physical Exam   General: Older, very pleasant W male appearing in no acute distress. Head: Normocephalic, atraumatic.  Neck: Supple without bruits, JVD. Lungs:  Resp regular and  unlabored, diminished with crackles in lower lobes. Heart: RRR, S1, S2, no S3, S4, or murmur; no rub. Abdomen: Soft, non-tender, non-distended with normoactive bowel sounds. No hepatomegaly. No rebound/guarding. No obvious abdominal masses. Extremities: No clubbing, cyanosis, 1+ edema. Distal pedal pulses are 2+ bilaterally. Neuro: Alert and oriented X 3. Moves all extremities spontaneously. Psych: Normal affect.  Labs    Chemistry Recent Labs Lab 10/10/16 0241 10/10/16 2209 10/11/16 0237 10/12/16 0320  NA 135  --  138 137  K 4.4  --  4.4 4.0  CL 101  --  104 102  CO2 24  --  31 26  GLUCOSE 243* 400* 265* 232*  BUN 70*  --  68* 67*  CREATININE 2.94*  --  2.86* 2.64*  CALCIUM 8.8*  --  8.6* 8.7*  GFRNONAA 20*  --  20* 22*  GFRAA 23*  --  24* 26*  ANIONGAP 10  --  3* 9     Hematology Recent Labs Lab 10/10/16 0241 10/11/16 0237 10/12/16 0320  WBC  9.9 10.8* 12.2*  RBC 3.52* 3.21* 3.19*  HGB 10.2* 9.4* 9.1*  HCT 32.1* 29.5* 29.0*  MCV 91.2 91.9 90.9  MCH 29.0 29.3 28.5  MCHC 31.8 31.9 31.4  RDW 14.0 14.2 14.2  PLT 300 301 323    Cardiac EnzymesNo results for input(s): TROPONINI in the last 168 hours. No results for input(s): TROPIPOC in the last 168 hours.   BNP Recent Labs Lab 10/09/16 0346  BNP 546.7*     DDimer No results for input(s): DDIMER in the last 168 hours.    Radiology    No results found.  Cardiac Studies   TTE: 10/04/16  Study Conclusions  - Left ventricle: Poor acoustic windows limit studly Defiinity used. LVEF is approximately 35% with diffuse hypokinesis. The cavity size was mildly dilated. Wall thickness was increased in a pattern of mild LVH. Doppler parameters are consistent with abnormal left ventricular relaxation (grade 1 diastolic dysfunction). Doppler parameters are consistent with high ventricular filling pressure.  Cath: 10/08/16  Conclusion   Conclusions: 1. Multivessel coronary artery disease,  including 50% proximal LAD stenosis, 70% proximal D1 lesion, chronic total occlusion of mid LCx, and sequential 80% ostial and 95% distal RCA stenoses. 2. Moderate to severely elevated left ventricular filling pressure.  Recommendations: 1. Cardiac surgery consultation for CABG, given complex multivessel coronary artery disease, diabetes mellitus, and reduced LVEF. If the patient is deemed a poor surgical candidate, consider PCI to RCA (this would likely require atherectomy of the ostial lesion). 2. Restart heparin infusion 4 hours following TR band deflation.  3. Continue diuresis and optimization of heart failure therapy.   Nelva Bush, MD    Patient Profile     71 y.o. male with history of DM, HTN, CVA, HTN, chronic diastolic CHF admitted with dyspnea, and CHF. Underwent cath with multivessel disease. Not a surgical candidate, planned for PCI.    Assessment & Plan    1. NSTEMI: Admitted with chest pain and dyspnea in setting of volume overload with peak troponin of 30.58. LVEF=35% on echo which is new drop in LVEF - cath with multivessel CAD with 50% prox LAD, 70% D1, CTO LCX, 80% ostial RCA and 95% distal RCA - CT surgery consulted but felt not to be a candidate for CABG. Films reviewed by interventionalists and planned for atherectomy of RCA tomorrow with Dr. Angelena Form given labs are stable.  - on ASA, atorva, coreg, hep gtt, nitro gtt. No ACE/ARB due to poor renal functio  2. Acute systolic HF: negative 6.4PPIRJJ since admission. Fairly stable renal function with diuresis, actually mildly improved today. Responded well to metolazone.  - medical therapy with coreg and Isordil. No ACE-I/ARB/ARNI/aldactone given poor renal function. Allergy listed to ACE-I and ARBs. - will hold diuretic in the am with plans for cath.   3. AKI on CKD: followed with assistance of nephrology - fairly stable after cath, mildly improved today.   4. Anemia: Baseline appears around 9-10. Stable  currently.  5. IDDM: Hgb A1c 7.6 this admission. CBGs have been elevated this admission. Increased Lantus to 30mg  daily. Added 4 units TID with meals to SSI yesterday. CBG improved this morning.  - SSI while inpatient   Signed, [name], NP  10/12/2016, 11:23 AM  Pager # [phone]   Patient seen and examined and history reviewed. Agree with above findings and plan. He is feeling better. Less dyspnea. Good diuretic response to metolazone and creatinine is coming down. Still volume overloaded. Will continue current diuresis today. Plan  to proceed with PCI tomorrow with Dr. Angelena Form. Will hold diuretics in am for procedure.   Peter Martinique, [place] 10/12/2016 1:01 PM     For questions or updates, please contact [facility] Please consult [email or web address] for contact info under Cardiology/STEMI. Daytime calls, contact the Day Call APP (6a-8a) or assigned team (Teams A-D) provider (7:30a - 5p). All other daytime calls (7:30-5p), contact the Card Master @ [phone].   Nighttime calls, contact the assigned APP (5p-8p) or MD (6:30p-8p). Overnight calls (8p-6a), contact the on call Fellow @ [phone].

## 2016-10-13 NOTE — Progress Notes (Signed)
OT Cancellation Note  Patient Details Name: Mitchell Garcia MRN: [id number] DOB: Feb 27, 1945   Cancelled Treatment:    Reason Eval/Treat Not Completed: Patient at procedure or test/ unavailable. Pt off unit for procedure. Will check back as able to continue with OT plan of care.   Norman Herrlich, MS OTR/L  Pager: [place] [name] 10/13/2016, 11:32 AM

## 2016-10-13 NOTE — Progress Notes (Signed)
[facility] Progress Note    Assessment/ Plan:   1. Acute kidney injury superimposed on CKD Stage 3b-4: Baseline creatninine 2.0-2.5.  Slowly Resolving over past several days.  Patient aware of risk for worsening kidney function including need for dialysis following cardiac catheterization. He accepts these risks.  2. Acute systolic CHF/NSTEMI: troponins peaked in the 30s.  On hep gtt and nitro gtt.  Cath with 3 vessel disease, not a CABG candidate, for PCI today. Diuretics held, was diuresing nicely with 80 IV 3 times a day Lasix and 2.5 mg a daily metolazone. Hold diuretics, reassess tomorrow.  3. DM II: A1c 7.6  4.  Ischemic cardiomyopathy: on BB, no ACEi/ARB due to h/o hyperkalemia and renal insufficiency.  BiDil.  Subjective:    For cardiac catheterization today Diuretics held this morning Excellent urine output yesterday, stable renal function Patient with improved orthopnea and PND, no complaints this morning    Objective:   BP 138/70 (BP Location: Left Arm)   Pulse 65   Temp 97.8 F (36.6 C) (Oral)   Resp 18   Ht 5\' 11"  (1.803 m)   Wt 113.1 kg (249 lb 5.4 oz)   SpO2 96%   BMI 34.78 kg/m   Intake/Output Summary (Last 24 hours) at 10/13/16 1113 Last data filed at 10/13/16 0900  Gross per 24 hour  Intake              360 ml  Output             3375 ml  Net            -3015 ml   Weight change: -1.1 kg (-2 lb 6.8 oz)  Physical Exam: GEN NAD HEENT EOMI PULM diminishedi n bases CV RRR no m/r/g ABD large pannus EXT 1+ pitting edema bilaterally NEURO AAO x 3, R arm deficits  Labs: BMET  Recent Labs Lab 10/07/16 0350 10/08/16 0259 10/09/16 0346 10/10/16 0241 10/10/16 2209 10/11/16 0237 10/12/16 0320 10/13/16 0248  NA 133* 133* 134* 135  --  138 137 136  K 4.2 4.3 4.3 4.4  --  4.4 4.0 3.9  CL 102 102 103 101  --  104 102 98*  CO2 23 24 23 24   --  31 26 28   GLUCOSE 238* 219* 203* 243* 400* 265* 232* 203*  BUN 67* 69* 69* 70*  --  68*  67* 69*  CREATININE 3.01* 2.97* 2.92* 2.94*  --  2.86* 2.64* 2.65*  CALCIUM 8.3* 8.4* 8.5* 8.8*  --  8.6* 8.7* 8.8*   CBC  Recent Labs Lab 10/10/16 0241 10/11/16 0237 10/12/16 0320 10/13/16 0248  WBC 9.9 10.8* 12.2* 11.0*  HGB 10.2* 9.4* 9.1* 9.4*  HCT 32.1* 29.5* 29.0* 29.8*  MCV 91.2 91.9 90.9 90.3  PLT 300 301 323 316    Medications:    . aspirin EC  81 mg Oral Daily  . atorvastatin  80 mg Oral QHS  . carvedilol  3.125 mg Oral BID WC  . clopidogrel  75 mg Oral Daily  . gabapentin  300 mg Oral BID  . insulin aspart  0-5 Units Subcutaneous QHS  . insulin aspart  0-9 Units Subcutaneous TID WC  . insulin aspart  4 Units Subcutaneous TID WC  . insulin glargine  35 Units Subcutaneous QHS  . isosorbide-hydrALAZINE  1 tablet Oral BID  . primidone  100 mg Oral Daily  . sodium chloride flush  3 mL Intravenous Q12H  . sodium chloride flush  3 mL Intravenous Q12H      [facility] 10/13/2016, 11:13 AM

## 2016-10-13 NOTE — Progress Notes (Signed)
Progress Note  Patient Name: Mitchell Garcia Date of Encounter: 10/13/2016  Primary Cardiologist: Angelena Form   Subjective   Feeling well, no chest pain, and breathing is stable.   Inpatient Medications    Scheduled Meds: . aspirin EC  81 mg Oral Daily  . atorvastatin  80 mg Oral QHS  . carvedilol  3.125 mg Oral BID WC  . clopidogrel  75 mg Oral Daily  . gabapentin  300 mg Oral BID  . insulin aspart  0-5 Units Subcutaneous QHS  . insulin aspart  0-9 Units Subcutaneous TID WC  . insulin aspart  4 Units Subcutaneous TID WC  . insulin glargine  30 Units Subcutaneous QHS  . isosorbide-hydrALAZINE  1 tablet Oral BID  . primidone  100 mg Oral Daily  . sodium chloride flush  3 mL Intravenous Q12H  . sodium chloride flush  3 mL Intravenous Q12H   Continuous Infusions: . sodium chloride    . sodium chloride 250 mL (10/13/16 0811)  . heparin 1,450 Units/hr (10/13/16 0813)  . nitroGLYCERIN 10 mcg/min (10/13/16 0923)   PRN Meds: sodium chloride, sodium chloride, acetaminophen, nitroGLYCERIN, ondansetron (ZOFRAN) IV, sodium chloride flush, sodium chloride flush   Vital Signs    Vitals:   10/13/16 0013 10/13/16 0519 10/13/16 0534 10/13/16 0806  BP: (!) 150/80  (!) 121/57 138/70  Pulse: (!) 57 69 60 65  Resp: 18  18 18   Temp: 98.6 F (37 C)  98.1 F (36.7 C) 97.8 F (36.6 C)  TempSrc: Oral  Oral Oral  SpO2: 96% 94% 93% 96%  Weight:  249 lb 5.4 oz (113.1 kg)    Height:        Intake/Output Summary (Last 24 hours) at 10/13/16 1055 Last data filed at 10/13/16 0900  Gross per 24 hour  Intake              360 ml  Output             3375 ml  Net            -3015 ml   Filed Weights   10/11/16 0355 10/12/16 0455 10/13/16 0519  Weight: 251 lb 8.7 oz (114.1 kg) 251 lb 12.3 oz (114.2 kg) 249 lb 5.4 oz (113.1 kg)    Telemetry    SR with PVCs - Personally Reviewed   Physical Exam   General: Older W male appearing in no acute distress. Head: Normocephalic,  atraumatic.  Neck: Supple without bruits, JVD. Lungs:  Resp regular and unlabored, diminished bilaterally, mild crackles in lower lobes. Heart: RRR, S1, S2, no S3, S4, or murmur; no rub. Abdomen: Soft, non-tender, non-distended with normoactive bowel sounds. No hepatomegaly. No rebound/guarding. No obvious abdominal masses. Extremities: No clubbing, cyanosis, 1+ edema. Distal pedal pulses are 2+ bilaterally. Neuro: Alert and oriented X 3. Moves all extremities spontaneously. Psych: Normal affect.  Labs    Chemistry Recent Labs Lab 10/11/16 0237 10/12/16 0320 10/13/16 0248  NA 138 137 136  K 4.4 4.0 3.9  CL 104 102 98*  CO2 31 26 28   GLUCOSE 265* 232* 203*  BUN 68* 67* 69*  CREATININE 2.86* 2.64* 2.65*  CALCIUM 8.6* 8.7* 8.8*  GFRNONAA 20* 22* 22*  GFRAA 24* 26* 26*  ANIONGAP 3* 9 10     Hematology Recent Labs Lab 10/11/16 0237 10/12/16 0320 10/13/16 0248  WBC 10.8* 12.2* 11.0*  RBC 3.21* 3.19* 3.30*  HGB 9.4* 9.1* 9.4*  HCT 29.5* 29.0* 29.8*  MCV 91.9  90.9 90.3  MCH 29.3 28.5 28.5  MCHC 31.9 31.4 31.5  RDW 14.2 14.2 14.1  PLT 301 323 316    Cardiac EnzymesNo results for input(s): TROPONINI in the last 168 hours. No results for input(s): TROPIPOC in the last 168 hours.   BNP Recent Labs Lab 10/09/16 0346  BNP 546.7*     DDimer No results for input(s): DDIMER in the last 168 hours.    Radiology    No results found.  Cardiac Studies   TTE: 10/04/16  Study Conclusions  - Left ventricle: Poor acoustic windows limit studly Defiinity used. LVEF is approximately 35% with diffuse hypokinesis. The cavity size was mildly dilated. Wall thickness was increased in a pattern of mild LVH. Doppler parameters are consistent with abnormal left ventricular relaxation (grade 1 diastolic dysfunction). Doppler parameters are consistent with high ventricular filling pressure.  Cath: 10/08/16  Conclusion   Conclusions: 1. Multivessel coronary  artery disease, including 50% proximal LAD stenosis, 70% proximal D1 lesion, chronic total occlusion of mid LCx, and sequential 80% ostial and 95% distal RCA stenoses. 2. Moderate to severely elevated left ventricular filling pressure.  Recommendations: 1. Cardiac surgery consultation for CABG, given complex multivessel coronary artery disease, diabetes mellitus, and reduced LVEF. If the patient is deemed a poor surgical candidate, consider PCI to RCA (this would likely require atherectomy of the ostial lesion). 2. Restart heparin infusion 4 hours following TR band deflation.  3. Continue diuresis and optimization of heart failure therapy.   Nelva Bush, MD    Patient Profile     71 y.o. male with history of DM, HTN, CVA, HTN, chronic diastolic CHF admitted with dyspnea, and CHF. Underwent cath with multivessel disease. Not a surgical candidate, planned for PCI.  Assessment & Plan    1. NSTEMI: Admitted with chest pain and dyspnea in setting of volume overload with peak troponin of 30.58. LVEF=35% on echo which is new drop in LVEF - cath with multivessel CAD with 50% prox LAD, 70% D1, CTO LCX, 80% ostial RCA and 95% distal RCA - CT surgery consulted but felt not to be a candidate for CABG. Films reviewed by interventionalists and planned for atherectomy of RCA today with Dr. Angelena Form given labs are stable.  - on ASA, atorva, coreg, hep gtt, nitro gtt. No ACE/ARB due to poor renal functio  2. Acute systolic ZJ:IRCVELFY 10.1BPZWCH since admission, with 4.5 L yesterday. Fairly stable renal function with diuresis, actually mildly improved today.  - lasix held with plans for cath today - medical therapy with coreg and Isordil. No ACE-I/ARB/ARNI/aldactone given poor renal function. Allergy listed to ACE-I and ARBs.  3. AKI on ENI:DPOEUMPN with assistance of nephrology - stable  4. Anemia:Baseline appears around 9-10. Stable currently.  5. IDDM:Hgb A1c 7.6 this admission. CBGs  have been elevated this admission, improved but still not controlled. Will increase Lantus to 35 units today.  - SSI, with 4 additional units while inpatient   Signed, [name], NP  10/13/2016, 10:55 AM  Pager # [phone]   Patient seen and examined and history reviewed. Agree with above findings and plan. Patient seen post PCI. Feels well. No chest pain or SOB. Groin OK. Diuretics held today for contrast procedure. Will check BMET in am. Plan to resume diuretics tomorrow. Reassess LV function in 3 months.  Peter Martinique, [place] 10/13/2016 3:01 PM     For questions or updates, please contact [facility] Please consult [email or web address] for contact info under Cardiology/STEMI. Daytime calls, contact  the Day Call APP (6a-8a) or assigned team (Teams A-D) provider (7:30a - 5p). All other daytime calls (7:30-5p), contact the Card Master @ [phone].   Nighttime calls, contact the assigned APP (5p-8p) or MD (6:30p-8p). Overnight calls (8p-6a), contact the on call Fellow @ [phone].

## 2016-10-13 NOTE — Interval H&P Note (Signed)
History and Physical Interval Note:  10/13/2016 11:03 AM  Mitchell Garcia  has presented today for PCI of the RCA with the diagnosis of CAD. The various methods of treatment have been discussed with the patient and family. After consideration of risks, benefits and other options for treatment, the patient has consented to  Procedure(s): CORONARY STENT INTERVENTION (N/A) as a surgical intervention .  The patient's history has been reviewed, patient examined, no change in status, stable for surgery.  I have reviewed the patient's chart and labs.  Questions were answered to the patient's satisfaction.    Cath Lab Visit (complete for each Cath Lab visit)  Clinical Evaluation Leading to the Procedure:   ACS: Yes.    Non-ACS:    Anginal Classification: CCS III  Anti-ischemic medical therapy: Maximal Therapy (2 or more classes of medications)  Non-Invasive Test Results: No non-invasive testing performed  Prior CABG: No previous CABG         Mitchell Garcia

## 2016-10-14 LAB — BASIC METABOLIC PANEL
ANION GAP: 9 (ref 5–15)
BUN: 67 mg/dL — ABNORMAL HIGH (ref 6–20)
CHLORIDE: 101 mmol/L (ref 101–111)
CO2: 26 mmol/L (ref 22–32)
Calcium: 8.6 mg/dL — ABNORMAL LOW (ref 8.9–10.3)
Creatinine, Ser: 2.63 mg/dL — ABNORMAL HIGH (ref 0.61–1.24)
GFR calc non Af Amer: 23 mL/min — ABNORMAL LOW (ref >60)
GFR, EST AFRICAN AMERICAN: 26 mL/min — AB (ref >60)
Glucose, Bld: 179 mg/dL — ABNORMAL HIGH (ref 65–99)
POTASSIUM: 4.2 mmol/L (ref 3.5–5.1)
SODIUM: 136 mmol/L (ref 135–145)

## 2016-10-14 LAB — GLUCOSE, CAPILLARY
GLUCOSE-CAPILLARY: 147 mg/dL — AB (ref 65–99)
GLUCOSE-CAPILLARY: 199 mg/dL — AB (ref 65–99)
Glucose-Capillary: 195 mg/dL — ABNORMAL HIGH (ref 65–99)
Glucose-Capillary: 167 mg/dL — ABNORMAL HIGH (ref 65–99)

## 2016-10-14 LAB — CBC
HCT: 30.6 % — ABNORMAL LOW (ref 39.0–52.0)
HEMOGLOBIN: 9.6 g/dL — AB (ref 13.0–17.0)
MCH: 28.7 pg (ref 26.0–34.0)
MCHC: 31.4 g/dL (ref 30.0–36.0)
MCV: 91.6 fL (ref 78.0–100.0)
Platelets: 338 10*3/uL (ref 150–400)
RBC: 3.34 MIL/uL — AB (ref 4.22–5.81)
RDW: 14.4 % (ref 11.5–15.5)
WBC: 9.3 10*3/uL (ref 4.0–10.5)

## 2016-10-14 NOTE — Progress Notes (Signed)
CARDIAC REHAB PHASE I   PRE:  Rate/Rhythm: 76 SR  BP:  Supine: 156/64  Sitting:   Standing:    SaO2: 94%RA  MODE:  Ambulation: 300 ft   POST:  Rate/Rhythm: 103 ST  BP:  Supine:   Sitting: 168/73  Standing:    SaO2: 96%RA 0950-1050 Pt walked 300 ft on RA with hand held asst with fairly steady gait. Stated it takes a while for his knees to start working.  No CP. Stressed importance of plavix with stent. Discussed MI restrictions, NTG use, modified ex ed and CRP 2. Referred to CRP 2 [place] but pt stated he will not attend. Left diabetic, heart healthy and sodium restriction diets. Discussed with pt the need to watch sodium with low EF. Will follow up tomorrow.   Graylon Good, RN BSN  10/14/2016 10:39 AM

## 2016-10-14 NOTE — Care Management Note (Signed)
Case Management Note  Patient Details  Name: Mitchell Garcia MRN: [id number] Date of Birth: [birth year]  Subjective/Objective:  From home , NSTEMI, s/p coronary stent intervention, will be on plavix.  Per pt eval rec cardiac rehab.                      Action/Plan: NCM will follow for dc needs.   Expected Discharge Date:                  Expected Discharge Plan:  Home/Self Care  In-House Referral:     Discharge planning Services  CM Consult  Post Acute Care Choice:    Choice offered to:     DME Arranged:    DME Agency:     HH Arranged:    [place] Agency:     Status of Service:  Completed, signed off  If discussed at [employer] of Stay Meetings, dates discussed:    Additional Comments:  Zenon Mayo, RN 10/14/2016, 9:20 AM

## 2016-10-14 NOTE — Progress Notes (Signed)
Physical Therapy Treatment Patient Details Name: Mitchell Garcia MRN: [id number] DOB: 1945/11/27 Today's Date: 10/14/2016    History of Present Illness Mitchell Garcia is a 71 y.o. male with a history of NIDDM, HTN, Stroke, CKD, HTN, chronic diastolic CHF who presented from the [facility] office with progressive dyspnea. NSTEMI    PT Comments    Patient progressing well towards PT goals. Performed gait training and stair training with Min guard-supervision for safety. VSS throughout with 2/4 DOE during activity. Pt eager to return home. Encouraged ambulation a few times per day to maintain strength/mobility. Will continue to follow.   Follow Up Recommendations  Other (comment) (cardiac rehab)     Equipment Recommendations  Cane    Recommendations for Other Services       Precautions / Restrictions Precautions Precautions: Fall Restrictions Weight Bearing Restrictions: No    Mobility  Bed Mobility Overal bed mobility: Modified Independent             General bed mobility comments: increased time and effort; use of rail and HOB elevated; toward L side due to R UE weakness and limited ROM; educated on technique  Transfers Overall transfer level: Needs assistance Equipment used: None Transfers: Sit to/from Stand Sit to Stand: Supervision         General transfer comment: supervision for safety  Ambulation/Gait Ambulation/Gait assistance: Min guard Ambulation Distance (Feet): 275 Feet Assistive device: None Gait Pattern/deviations: Step-through pattern;Decreased step length - right;Decreased step length - left;Wide base of support Gait velocity: decreased   General Gait Details: pt with grossly steady gait with wide BoS and waddling like pattern; Able to turn head with only minor deviations in gait but no LOB. 2/4 DOE.   Stairs Stairs: Yes   Stair Management: One rail Left;Step to pattern;Forwards Number of Stairs: 7 General stair comments: Cues for  technique and safety due to SOB.  Wheelchair Mobility    Modified Rankin (Stroke Patients Only)       Balance Overall balance assessment: Needs assistance Sitting-balance support: Feet supported;No upper extremity supported Sitting balance-Leahy Scale: Good     Standing balance support: During functional activity Standing balance-Leahy Scale: Fair                              Cognition Arousal/Alertness: Awake/alert Behavior During Therapy: WFL for tasks assessed/performed Overall Cognitive Status: Within Functional Limits for tasks assessed                                        Exercises      General Comments General comments (skin integrity, edema, etc.): VSS throughout.      Pertinent Vitals/Pain Pain Assessment: No/denies pain    Home Living                      Prior Function            PT Goals (current goals can now be found in the care plan section) Progress towards PT goals: Progressing toward goals    Frequency    Min 3X/week      PT Plan Current plan remains appropriate    Co-evaluation              AM-PAC PT "6 Clicks" Daily Activity  Outcome Measure  Difficulty turning over in bed (including adjusting  bedclothes, sheets and blankets)?: None Difficulty moving from lying on back to sitting on the side of the bed? : None Difficulty sitting down on and standing up from a chair with arms (e.g., wheelchair, bedside commode, etc,.)?: None Help needed moving to and from a bed to chair (including a wheelchair)?: A Little Help needed walking in hospital room?: A Little Help needed climbing 3-5 steps with a railing? : A Little 6 Click Score: 21    End of Session Equipment Utilized During Treatment: Gait belt Activity Tolerance: Patient tolerated treatment well Patient left: in bed;with call bell/phone within reach;with bed alarm set (sitting EOB.) Nurse Communication: Mobility status PT Visit  Diagnosis: Other abnormalities of gait and mobility (R26.89)     Time: 1251-1320 PT Time Calculation (min) (ACUTE ONLY): 29 min  Charges:  $Gait Training: 23-37 mins                    G Codes:       [name], PT, DPT [place] 10/14/2016, 1:31 PM

## 2016-10-14 NOTE — Progress Notes (Signed)
Occupational Therapy Treatment Patient Details Name: Mitchell Garcia MRN: [id number] DOB: 1945-06-22 Today's Date: 10/14/2016    History of present illness SHLOIMA Garcia is a 71 y.o. male with a history of NIDDM, HTN, Stroke, CKD, HTN, chronic diastolic CHF who presented from the [facility] office with progressive dyspnea. NSTEMI   OT comments  Pt demonstrating ability to perform 5 minutes of grooming standing at sink, toileting and dressing with supervision. Pt has a shower seat at home should he need it for energy conservation. Progressing well.  Follow Up Recommendations  Other (comment) (cardiac rehab)    Equipment Recommendations  None recommended by OT    Recommendations for Other Services      Precautions / Restrictions Precautions Precautions: Fall Restrictions Weight Bearing Restrictions: No       Mobility Bed Mobility Overal bed mobility: Modified Independent             General bed mobility comments: increased time, to L side of bed, cues for efficient technique  Transfers Overall transfer level: Needs assistance Equipment used: None Transfers: Sit to/from Stand Sit to Stand: Supervision         General transfer comment: supervision for safety, assist for lines    Balance Overall balance assessment: Needs assistance Sitting-balance support: Feet supported;No upper extremity supported Sitting balance-Leahy Scale: Good Sitting balance - Comments: no LOB with donning socks   Standing balance support: During functional activity Standing balance-Leahy Scale: Fair                             ADL either performed or assessed with clinical judgement   ADL Overall ADL's : Needs assistance/impaired     Grooming: Wash/dry hands;Wash/dry face;Oral care;Supervision/safety;Standing (stood x 5 minutes)           Upper Body Dressing : Set up;Sitting Upper Body Dressing Details (indicate cue type and reason): pt dresses R UE first  due to limited shoulder Lower Body Dressing: Set up;Sit to/from stand Lower Body Dressing Details (indicate cue type and reason): socks Toilet Transfer: Supervision/safety;Ambulation;Comfort height toilet   Toileting- Clothing Manipulation and Hygiene: Supervision/safety;Sit to/from stand       Functional mobility during ADLs: Min guard General ADL Comments: Pt has a shower seat he can use as needed when he fatigues.     Vision       Perception     Praxis      Cognition Arousal/Alertness: Awake/alert Behavior During Therapy: WFL for tasks assessed/performed Overall Cognitive Status: Within Functional Limits for tasks assessed                                          Exercises     Shoulder Instructions       General Comments VSS throughout.    Pertinent Vitals/ Pain       Pain Assessment: No/denies pain  Home Living                                          Prior Functioning/Environment              Frequency  Min 2X/week        Progress Toward Goals  OT Goals(current goals can now be found in  the care plan section)  Progress towards OT goals: Progressing toward goals  Acute Rehab OT Goals Patient Stated Goal: get back to normal  OT Goal Formulation: With patient/family Time For Goal Achievement: 10/25/16 Potential to Achieve Goals: Good  Plan Discharge plan remains appropriate    Co-evaluation                 AM-PAC PT "6 Clicks" Daily Activity     Outcome Measure   Help from another person eating meals?: None Help from another person taking care of personal grooming?: A Little Help from another person toileting, which includes using toliet, bedpan, or urinal?: A Little Help from another person bathing (including washing, rinsing, drying)?: A Little Help from another person to put on and taking off regular upper body clothing?: None Help from another person to put on and taking off regular lower  body clothing?: A Little 6 Click Score: 20    End of Session    OT Visit Diagnosis: Unsteadiness on feet (R26.81)   Activity Tolerance Patient tolerated treatment well   Patient Left in bed;with call bell/phone within reach;with bed alarm set   Nurse Communication          Time: 4193-7902 OT Time Calculation (min): 31 min  Charges: OT General Charges $OT Visit: 1 Visit OT Treatments $Self Care/Home Management : 23-37 mins  10/14/2016 Nestor Lewandowsky, OTR/L Pager: [place], [place] 10/14/2016, 2:28 PM

## 2016-10-14 NOTE — Progress Notes (Signed)
Progress Note  Patient Name: Mitchell Garcia Date of Encounter: 10/14/2016  Primary Cardiologist: Angelena Form   Subjective   Feeling well, no chest pain, breathing is better.  Inpatient Medications    Scheduled Meds: . aspirin EC  81 mg Oral Daily  . atorvastatin  80 mg Oral QHS  . carvedilol  3.125 mg Oral BID WC  . clopidogrel  75 mg Oral Daily  . gabapentin  300 mg Oral BID  . insulin aspart  0-5 Units Subcutaneous QHS  . insulin aspart  0-9 Units Subcutaneous TID WC  . insulin aspart  4 Units Subcutaneous TID WC  . insulin glargine  35 Units Subcutaneous QHS  . isosorbide-hydrALAZINE  1 tablet Oral BID  . primidone  100 mg Oral Daily  . sodium chloride flush  3 mL Intravenous Q12H  . sodium chloride flush  3 mL Intravenous Q12H   Continuous Infusions: . sodium chloride    . sodium chloride    . nitroGLYCERIN Stopped (10/13/16 2300)   PRN Meds: sodium chloride, sodium chloride, acetaminophen, nitroGLYCERIN, ondansetron (ZOFRAN) IV, sodium chloride flush, sodium chloride flush   Vital Signs    Vitals:   10/13/16 2000 10/13/16 2025 10/14/16 0533 10/14/16 0751  BP:  (!) 141/66 (!) 148/68 (!) 150/63  Pulse:  76 85 (!) 59  Resp: 17 19 19 19   Temp:  98.5 F (36.9 C) 97.8 F (36.6 C) 98.1 F (36.7 C)  TempSrc:  Oral Oral Oral  SpO2:    95%  Weight:   246 lb 14.6 oz (112 kg)   Height:        Intake/Output Summary (Last 24 hours) at 10/14/16 1031 Last data filed at 10/14/16 0533  Gross per 24 hour  Intake              613 ml  Output             1275 ml  Net             -662 ml   Filed Weights   10/12/16 0455 10/13/16 0519 10/14/16 0533  Weight: 251 lb 12.3 oz (114.2 kg) 249 lb 5.4 oz (113.1 kg) 246 lb 14.6 oz (112 kg)    Telemetry    SR with PVCs - Personally Reviewed   Physical Exam   General: Older W male appearing in no acute distress. Head: Normocephalic, atraumatic.  Neck: Supple without bruits, JVD. Lungs:  Resp regular and unlabored, no  rales or rhonchi Heart: RRR, S1, S2, no S3, S4, or murmur; no rub. Abdomen: Soft, non-tender, non-distended with normoactive bowel sounds. No hepatomegaly. No rebound/guarding. No obvious abdominal masses. Extremities: No clubbing, cyanosis, tr-1+ edema. Distal pedal pulses are 2+ bilaterally. Neuro: Alert and oriented X 3. Moves all extremities spontaneously. Psych: Normal affect.  Labs    Chemistry  Recent Labs Lab 10/12/16 0320 10/13/16 0248 10/14/16 0235  NA 137 136 136  K 4.0 3.9 4.2  CL 102 98* 101  CO2 26 28 26   GLUCOSE 232* 203* 179*  BUN 67* 69* 67*  CREATININE 2.64* 2.65* 2.63*  CALCIUM 8.7* 8.8* 8.6*  GFRNONAA 22* 22* 23*  GFRAA 26* 26* 26*  ANIONGAP 9 10 9      Hematology  Recent Labs Lab 10/12/16 0320 10/13/16 0248 10/14/16 0235  WBC 12.2* 11.0* 9.3  RBC 3.19* 3.30* 3.34*  HGB 9.1* 9.4* 9.6*  HCT 29.0* 29.8* 30.6*  MCV 90.9 90.3 91.6  MCH 28.5 28.5 28.7  MCHC 31.4 31.5  31.4  RDW 14.2 14.1 14.4  PLT 323 316 338    Cardiac EnzymesNo results for input(s): TROPONINI in the last 168 hours. No results for input(s): TROPIPOC in the last 168 hours.   BNP  Recent Labs Lab 10/09/16 0346  BNP 546.7*     DDimer No results for input(s): DDIMER in the last 168 hours.    Radiology    No results found.  Cardiac Studies   TTE: 10/04/16  Study Conclusions  - Left ventricle: Poor acoustic windows limit studly Defiinity used. LVEF is approximately 35% with diffuse hypokinesis. The cavity size was mildly dilated. Wall thickness was increased in a pattern of mild LVH. Doppler parameters are consistent with abnormal left ventricular relaxation (grade 1 diastolic dysfunction). Doppler parameters are consistent with high ventricular filling pressure.  Cath: 10/08/16  Conclusion   Conclusions: 1. Multivessel coronary artery disease, including 50% proximal LAD stenosis, 70% proximal D1 lesion, chronic total occlusion of mid LCx, and  sequential 80% ostial and 95% distal RCA stenoses. 2. Moderate to severely elevated left ventricular filling pressure.  Recommendations: 1. Cardiac surgery consultation for CABG, given complex multivessel coronary artery disease, diabetes mellitus, and reduced LVEF. If the patient is deemed a poor surgical candidate, consider PCI to RCA (this would likely require atherectomy of the ostial lesion). 2. Restart heparin infusion 4 hours following TR band deflation.  3. Continue diuresis and optimization of heart failure therapy.   Nelva Bush, MD   Procedures   CORONARY STENT INTERVENTION  Temporary Pacemaker  Conclusion   1. Severe, calcified stenosis in the ostium of the RCA.  2. Successful orbital atherectomy of the ostial RCA with placement of a Synergy drug eluting stent 3. Severe distal RCA stenosis.  4. Successful PTCA/DES x 1 distal RCA (Synergy DES).   Recommendations: Continue DAPT with ASA and Plavix for at least one year.     Patient Profile     71 y.o. male with history of DM, HTN, CVA, HTN, chronic diastolic CHF admitted with dyspnea, and CHF. Underwent cath with multivessel disease. Not a surgical candidate, planned for PCI.  Assessment & Plan    1. NSTEMI: Admitted with chest pain and dyspnea in setting of volume overload with peak troponin of 30.58. LVEF=35% on echo which is new drop in LVEF - cath with multivessel CAD with 50% prox LAD, 70% D1, CTO LCX, 80% ostial RCA and 95% distal RCA - CT surgery consulted but felt not to be a candidate for CABG. Now s/p atherectomy and stenting of the ostial and mid RCA.  - on ASA, Plavix, atorva, coreg.  No ACE/ARB due to poor renal function - will need DAPT for one year.  2. Acute systolic DT:OIZTIWPY [id number] since admission, with good urine output yesterday despite holding diuretics.  - renal function stable today. Weight and swelling are down.  - continue to hold diuretics today per Nephrology. Probably resume  tomorrow. - medical therapy with coreg and Isordil. No ACE-I/ARB/ARNI/aldactone given poor renal function. Allergy listed to ACE-I and ARBs. - if renal function stable I would anticipate DC tomorrow.  3. AKI on NLZ:JQBHALPF with assistance of nephrology - stable  4. Anemia:Baseline appears around 9-10. Stable currently.  5. IDDM:Hgb A1c 7.6 this admission. On Lantus with SSI.   Signed,  Janeice Stegall [place], [place] 10/14/2016 10:31 AM     For questions or updates, please contact [place] Please consult [email or web address] for contact info under Cardiology/STEMI. Daytime calls, contact the Day Call APP (6a-8a) or  assigned team (Teams A-D) provider (7:30a - 5p). All other daytime calls (7:30-5p), contact the Card Master @ [phone].   Nighttime calls, contact the assigned APP (5p-8p) or MD (6:30p-8p). Overnight calls (8p-6a), contact the on call Fellow @ [phone].

## 2016-10-14 NOTE — Progress Notes (Signed)
[facility] Progress Note    Assessment/ Plan:   1. Acute kidney injury superimposed on CKD Stage 3b-4: Baseline creatninine 2.0-2.5.  Has been slowly Resolving over past several days.  S/p LHC with PCI 9/19, need to closely follow for risk of CIN.  Do not restart diuretics today, consder tomorrow. Need to monitor renal function another 24h, advise against DC today.    2. Acute systolic CHF/NSTEMI: troponins peaked in the 30s.  Cath with 3 vessel disease, not a CABG candidate, for PCI today. Diuretics held starting 9/19, was diuresing nicely with 80 IV 3 times a day Lasix and 2.5 mg a daily metolazone. Hold diuretics, reassess tomorrow.  3. DM II: A1c 7.6  4.  Ischemic cardiomyopathy: on BB, no ACEi/ARB due to h/o hyperkalemia and renal insufficiency.  BiDil.  Subjective:    PCI and atherectomy of RCA yesterday Renal function stable this AM, remains off diuretics UOP > 1L yesterday Wants to go home, stronglh In great spirits    Objective:   BP (!) 150/63 (BP Location: Left Arm)   Pulse (!) 59   Temp 98.1 F (36.7 C) (Oral)   Resp 19   Ht 5\' 11"  (1.803 m)   Wt 112 kg (246 lb 14.6 oz)   SpO2 95%   BMI 34.44 kg/m   Intake/Output Summary (Last 24 hours) at 10/14/16 0857 Last data filed at 10/14/16 0533  Gross per 24 hour  Intake              613 ml  Output             1375 ml  Net             -762 ml   Weight change: -1.1 kg (-2 lb 6.8 oz)  Physical Exam: GEN NAD HEENT EOMI PULM diminishedi n bases CV RRR no m/r/g ABD large pannus EXT 1+ pitting edema bilaterally NEURO AAO x 3, R arm deficits  Labs: BMET  Recent Labs Lab 10/08/16 0259 10/09/16 0346 10/10/16 0241 10/10/16 2209 10/11/16 0237 10/12/16 0320 10/13/16 0248 10/14/16 0235  NA 133* 134* 135  --  138 137 136 136  K 4.3 4.3 4.4  --  4.4 4.0 3.9 4.2  CL 102 103 101  --  104 102 98* 101  CO2 24 23 24   --  31 26 28 26   GLUCOSE 219* 203* 243* 400* 265* 232* 203* 179*  BUN 69* 69*  70*  --  68* 67* 69* 67*  CREATININE 2.97* 2.92* 2.94*  --  2.86* 2.64* 2.65* 2.63*  CALCIUM 8.4* 8.5* 8.8*  --  8.6* 8.7* 8.8* 8.6*   CBC  Recent Labs Lab 10/11/16 0237 10/12/16 0320 10/13/16 0248 10/14/16 0235  WBC 10.8* 12.2* 11.0* 9.3  HGB 9.4* 9.1* 9.4* 9.6*  HCT 29.5* 29.0* 29.8* 30.6*  MCV 91.9 90.9 90.3 91.6  PLT 301 323 316 338    Medications:    . aspirin EC  81 mg Oral Daily  . atorvastatin  80 mg Oral QHS  . carvedilol  3.125 mg Oral BID WC  . clopidogrel  75 mg Oral Daily  . gabapentin  300 mg Oral BID  . insulin aspart  0-5 Units Subcutaneous QHS  . insulin aspart  0-9 Units Subcutaneous TID WC  . insulin aspart  4 Units Subcutaneous TID WC  . insulin glargine  35 Units Subcutaneous QHS  . isosorbide-hydrALAZINE  1 tablet Oral BID  . primidone  100 mg Oral  Daily  . sodium chloride flush  3 mL Intravenous Q12H  . sodium chloride flush  3 mL Intravenous Q12H      [facility] 10/14/2016, 8:57 AM

## 2016-10-15 DIAGNOSIS — I255 Ischemic cardiomyopathy: Secondary | ICD-10-CM

## 2016-10-15 DIAGNOSIS — E1165 Type 2 diabetes mellitus with hyperglycemia: Secondary | ICD-10-CM

## 2016-10-15 DIAGNOSIS — I10 Essential (primary) hypertension: Secondary | ICD-10-CM

## 2016-10-15 DIAGNOSIS — N189 Chronic kidney disease, unspecified: Secondary | ICD-10-CM

## 2016-10-15 DIAGNOSIS — N179 Acute kidney failure, unspecified: Secondary | ICD-10-CM

## 2016-10-15 DIAGNOSIS — E1129 Type 2 diabetes mellitus with other diabetic kidney complication: Secondary | ICD-10-CM

## 2016-10-15 DIAGNOSIS — Z9861 Coronary angioplasty status: Secondary | ICD-10-CM

## 2016-10-15 DIAGNOSIS — IMO0002 Reserved for concepts with insufficient information to code with codable children: Secondary | ICD-10-CM

## 2016-10-15 DIAGNOSIS — I2510 Atherosclerotic heart disease of native coronary artery without angina pectoris: Secondary | ICD-10-CM

## 2016-10-15 DIAGNOSIS — I5041 Acute combined systolic (congestive) and diastolic (congestive) heart failure: Secondary | ICD-10-CM

## 2016-10-15 HISTORY — DX: Acute kidney failure, unspecified: N18.9

## 2016-10-15 HISTORY — DX: Ischemic cardiomyopathy: I25.5

## 2016-10-15 HISTORY — DX: Coronary angioplasty status: Z98.61

## 2016-10-15 HISTORY — DX: Essential (primary) hypertension: I10

## 2016-10-15 HISTORY — DX: Acute kidney failure, unspecified: N17.9

## 2016-10-15 HISTORY — DX: Atherosclerotic heart disease of native coronary artery without angina pectoris: I25.10

## 2016-10-15 HISTORY — DX: Type 2 diabetes mellitus with hyperglycemia: E11.65

## 2016-10-15 HISTORY — DX: Reserved for concepts with insufficient information to code with codable children: IMO0002

## 2016-10-15 LAB — GLUCOSE, CAPILLARY
GLUCOSE-CAPILLARY: 203 mg/dL — AB (ref 65–99)
Glucose-Capillary: 132 mg/dL — ABNORMAL HIGH (ref 65–99)
Glucose-Capillary: 165 mg/dL — ABNORMAL HIGH (ref 65–99)
Glucose-Capillary: 224 mg/dL — ABNORMAL HIGH (ref 65–99)
Glucose-Capillary: 191 mg/dL — ABNORMAL HIGH (ref 65–99)

## 2016-10-15 LAB — BASIC METABOLIC PANEL
ANION GAP: 11 (ref 5–15)
BUN: 70 mg/dL — ABNORMAL HIGH (ref 6–20)
CHLORIDE: 100 mmol/L — AB (ref 101–111)
CO2: 25 mmol/L (ref 22–32)
Calcium: 8.4 mg/dL — ABNORMAL LOW (ref 8.9–10.3)
Creatinine, Ser: 3.26 mg/dL — ABNORMAL HIGH (ref 0.61–1.24)
GFR calc Af Amer: 20 mL/min — ABNORMAL LOW (ref >60)
GFR, EST NON AFRICAN AMERICAN: 17 mL/min — AB (ref >60)
GLUCOSE: 134 mg/dL — AB (ref 65–99)
POTASSIUM: 4.1 mmol/L (ref 3.5–5.1)
Sodium: 136 mmol/L (ref 135–145)

## 2016-10-15 LAB — CBC
HEMATOCRIT: 28.8 % — AB (ref 39.0–52.0)
HEMOGLOBIN: 9 g/dL — AB (ref 13.0–17.0)
MCH: 28.8 pg (ref 26.0–34.0)
MCHC: 31.3 g/dL (ref 30.0–36.0)
MCV: 92.3 fL (ref 78.0–100.0)
PLATELETS: 316 10*3/uL (ref 150–400)
RBC: 3.12 MIL/uL — AB (ref 4.22–5.81)
RDW: 14.6 % (ref 11.5–15.5)
WBC: 10 10*3/uL (ref 4.0–10.5)

## 2016-10-15 MED ORDER — LIVING BETTER WITH HEART FAILURE BOOK
Freq: Once | Status: DC
  Filled 2016-10-15: qty 1

## 2016-10-15 NOTE — Progress Notes (Signed)
Physical Therapy Treatment/ Discharge Patient Details Name: Mitchell Garcia MRN: [id number] DOB: 1946-01-02 Today's Date: 10/15/2016    History of Present Illness Mitchell Garcia is a 71 y.o. male with a history of NIDDM, HTN, Stroke, CKD, HTN, chronic diastolic CHF who presented from the [facility] office with progressive dyspnea. NSTEMI    PT Comments    Pt moving well with baseline RUE deficits who sleeps in a recliner due to difficulty with right side. Pt with slow controlled gait with good balance and limited neck rotation. Pt currently at baseline functional status, able to complete long hall ambulation and transfers without assist. No further acute therapy needs at this time and will defer to cardiac rehab with pt aware and agreeable.  Encouraged walking daily with nursing staff.   HR 66-88 BP 126/54  Follow Up Recommendations  No PT follow up     Equipment Recommendations  None recommended by PT    Recommendations for Other Services       Precautions / Restrictions Precautions Precautions: Fall Restrictions Weight Bearing Restrictions: No    Mobility  Bed Mobility Overal bed mobility: Modified Independent             General bed mobility comments: increased time, to L side of bed, cues for efficient technique  Transfers Overall transfer level: Modified independent   Transfers: Sit to/from Stand           General transfer comment: pt able to transfer without assist or cues  Ambulation/Gait Ambulation/Gait assistance: Modified independent (Device/Increase time) Ambulation Distance (Feet): 600 Feet Assistive device: None Gait Pattern/deviations: Step-through pattern;Decreased stride length;Trunk flexed;Wide base of support   Gait velocity interpretation: Below normal speed for age/gender General Gait Details: pt with grossly steady gait with wide BOS, pt maintains roundede shoulder and forward head which is baseline for pt. No SOB or deficits and  pt states he is at baseline function   Financial trader Rankin (Stroke Patients Only)       Balance                                            Cognition Arousal/Alertness: Awake/alert Behavior During Therapy: WFL for tasks assessed/performed Overall Cognitive Status: Within Functional Limits for tasks assessed                                        Exercises      General Comments        Pertinent Vitals/Pain Pain Assessment: No/denies pain    Home Living                      Prior Function            PT Goals (current goals can now be found in the care plan section) Progress towards PT goals: Goals met/education completed, patient discharged from PT    Frequency           PT Plan Current plan remains appropriate    Co-evaluation              AM-PAC PT "6 Clicks" Daily Activity  Outcome Measure  Difficulty turning over in bed (including adjusting  bedclothes, sheets and blankets)?: A Little Difficulty moving from lying on back to sitting on the side of the bed? : A Little Difficulty sitting down on and standing up from a chair with arms (e.g., wheelchair, bedside commode, etc,.)?: None Help needed moving to and from a bed to chair (including a wheelchair)?: None Help needed walking in hospital room?: None Help needed climbing 3-5 steps with a railing? : A Little 6 Click Score: 21    End of Session Equipment Utilized During Treatment: Gait belt Activity Tolerance: Patient tolerated treatment well Patient left: in bed;with call bell/phone within reach;with family/visitor present Nurse Communication: Mobility status       Time: 3524-8185 PT Time Calculation (min) (ACUTE ONLY): 18 min  Charges:  $Gait Training: 8-22 mins                    G Codes:       Elwyn Reach, [place]    [place] 10/15/2016, 2:00 PM

## 2016-10-15 NOTE — Progress Notes (Signed)
Progress Note  Patient Name: Mitchell Garcia Date of Encounter: 10/15/2016  Primary Cardiologist: New to Dr. Angelena Form this adm  Subjective   Feels fine. No CP or SOB. No edema. Weight stable. States he's tired of being here.  Inpatient Medications    Scheduled Meds: . aspirin EC  81 mg Oral Daily  . atorvastatin  80 mg Oral QHS  . carvedilol  3.125 mg Oral BID WC  . clopidogrel  75 mg Oral Daily  . gabapentin  300 mg Oral BID  . insulin aspart  0-5 Units Subcutaneous QHS  . insulin aspart  0-9 Units Subcutaneous TID WC  . insulin aspart  4 Units Subcutaneous TID WC  . insulin glargine  35 Units Subcutaneous QHS  . isosorbide-hydrALAZINE  1 tablet Oral BID  . primidone  100 mg Oral Daily  . sodium chloride flush  3 mL Intravenous Q12H  . sodium chloride flush  3 mL Intravenous Q12H   Continuous Infusions: . sodium chloride    . sodium chloride    . nitroGLYCERIN Stopped (10/13/16 2300)   PRN Meds: sodium chloride, sodium chloride, acetaminophen, nitroGLYCERIN, ondansetron (ZOFRAN) IV, sodium chloride flush, sodium chloride flush   Vital Signs    Vitals:   10/14/16 1900 10/14/16 2000 10/14/16 2032 10/15/16 0613  BP:   131/63 (!) 135/57  Pulse: (!) 56 61 (!) 58 (!) 58  Resp: 17 (!) 22 14 (!) 21  Temp:   97.8 F (36.6 C) 98.4 F (36.9 C)  TempSrc:   Oral Oral  SpO2: 100% 96% 97% 95%  Weight:    246 lb 7.6 oz (111.8 kg)  Height:        Intake/Output Summary (Last 24 hours) at 10/15/16 0820 Last data filed at 10/15/16 0811  Gross per 24 hour  Intake              960 ml  Output             1650 ml  Net             -690 ml   Filed Weights   10/13/16 0519 10/14/16 0533 10/15/16 0613  Weight: 249 lb 5.4 oz (113.1 kg) 246 lb 14.6 oz (112 kg) 246 lb 7.6 oz (111.8 kg)    Telemetry    NSR occ PVCs- Personally Reviewed   Physical Exam   GEN: No acute distress. Morbidly obese HEENT: Normocephalic, atraumatic, sclera non-icteric. Neck: No JVD or  bruits. Cardiac: RRR no murmurs, rubs, or gallops.  Radials/DP/PT 1+ and equal bilaterally.  Respiratory: Clear to auscultation bilaterally. Breathing is unlabored. GI: Soft, nontender, non-distended, BS +x 4. MS: no deformity. Extremities: No clubbing or cyanosis. No edema. Distal pedal pulses are 2+ and equal bilaterally. Left radial cath site without hematoma or ecchymosis; good pulse. Neuro:  AAOx3. Follows commands. Psych:  Responds to questions appropriately with a normal affect.  Labs    Chemistry Recent Labs Lab 10/13/16 0248 10/14/16 0235 10/15/16 0238  NA 136 136 136  K 3.9 4.2 4.1  CL 98* 101 100*  CO2 28 26 25   GLUCOSE 203* 179* 134*  BUN 69* 67* 70*  CREATININE 2.65* 2.63* 3.26*  CALCIUM 8.8* 8.6* 8.4*  GFRNONAA 22* 23* 17*  GFRAA 26* 26* 20*  ANIONGAP 10 9 11      Hematology Recent Labs Lab 10/13/16 0248 10/14/16 0235 10/15/16 0238  WBC 11.0* 9.3 10.0  RBC 3.30* 3.34* 3.12*  HGB 9.4* 9.6* 9.0*  HCT 29.8*  30.6* 28.8*  MCV 90.3 91.6 92.3  MCH 28.5 28.7 28.8  MCHC 31.5 31.4 31.3  RDW 14.1 14.4 14.6  PLT 316 338 316    Cardiac EnzymesNo results for input(s): TROPONINI in the last 168 hours. No results for input(s): TROPIPOC in the last 168 hours.   BNP Recent Labs Lab 10/09/16 0346  BNP 546.7*     DDimer No results for input(s): DDIMER in the last 168 hours.   Radiology    No results found.  Cardiac Studies   2D echo 10/04/16 Study Conclusions - Left ventricle: Poor acoustic windows limit studly Defiinity   used. LVEF is approximately 35% with diffuse hypokinesis. The   cavity size was mildly dilated. Wall thickness was increased in a   pattern of mild LVH. Doppler parameters are consistent with   abnormal left ventricular relaxation (grade 1 diastolic   dysfunction). Doppler parameters are consistent with high   ventricular filling pressure.   Patient Profile     71 y.o. male with DM, HTN, CVA, prior chronic diastolic CHF, CKD  stage IV, chronic anemia admitted with NSTEMI and acute systolic CHF. Underwent cath with multivessel disease. Not a surgical candidate, planned for PCI.  Assessment & Plan    1. NSTEMI/newly diagnosed CAD - peak troponin 30. CT surgery consulted but felt not to be a candidate for CABG.. On 9/19, went back to cath lab for PCI s/p orbital atherectomy with DES to ostial RCA, and PTCA/DES to distal RCA. Residual 50% prox LAD, 70% D1, CTO mLCx noted by initial diagnostic cath 10/08/16 to be treated medically. Continue ASA, Plavix, BB, statin.  2. Acute combined CHF with new systolic dysfunction - EF 35% with grade 1 DD this admission (previously normal). Diuresed with IV Lasix/metolazone this admission with assistance from nephrology, weight 259->246. Not on ACEI/ARB/ARNI/Spiro due to advanced kidney disease. Now on Bidil and Coreg. Diuretics remain on hold given #3. Will rx CHF book and ask nutrition to see re: more indepth diet education. Change diet to renal diet with 2L fluid/2g sodium restriction.  3. AKI on CKD stage IV-V - Cr 2.63->3.26 today with elevated BUN. Appreciate renal input. Question whether he needs some gentle fluids back. Would also appreciate input from renal on patient's home sevelamer (not reordered this admission).  4. Essential HTN - controlled on present regimen.  5. Anemia, suspect of chronic disease - mild downtrend today but has been variable from 9.1-11.3 this admission. No acute bleeding. Follow. Suspect r/t CKD. Appreciate renal input regarding this. Place order for hemoccult given elevated BUN.  6. DM - A1C Hgb A1c 7.6 this admission. On Lantus here with SSI. Was on Novolog as OP. Will need close OP f/u.  Signed, Melina Copa PA-C (pager [phone]) 10/15/2016, 8:20 AM   Patient seen and examined and history reviewed. Agree with above findings and plan. Creatinine increased today post intervention to 3.26. Still has good urine output. Potassium OK. Renal to resume sevelamer  as outpatient on follow up. Will need to keep here until renal function stabilizes.  Maitri Schnoebelen [place], [place] 10/15/2016 9:53 AM

## 2016-10-15 NOTE — Progress Notes (Signed)
  [facility] Progress Note    Assessment/ Plan:   1. Acute kidney injury superimposed on CKD Stage 3b-4: now with CIN post LHC 9/19 1. Was improving prior to 2nd LHC and PCI 2. Now with CIN post LHC, good UOP, K Ok 3. Cont hold diuretics again today 4. COnt to daily monitor 5. No RRT needed 2. NSTEMI s/p LHC with PCI 10/13/16 3. SCHF 2/2 ICM, was diuresing well and improving, diuretics on hold now 4. DM2  Subjective:    SCr up to 3.3 today, UOP > 1L Feels great, no c/o Ambulatory around halls   Objective:   BP (!) 135/57   Pulse (!) 58   Temp 98.4 F (36.9 C) (Oral)   Resp (!) 21   Ht 5\' 11"  (1.803 m)   Wt 111.8 kg (246 lb 7.6 oz)   SpO2 95%   BMI 34.38 kg/m   Intake/Output Summary (Last 24 hours) at 10/15/16 0948 Last data filed at 10/15/16 0811  Gross per 24 hour  Intake              960 ml  Output             1650 ml  Net             -690 ml   Weight change: -0.2 kg (-7.1 oz)  Physical Exam: GEN NAD HEENT EOMI PULM diminishedi n bases CV RRR no m/r/g ABD large pannus EXT trace pitting edema bilaterally NEURO AAO x 3, R arm deficits  Labs: BMET  Recent Labs Lab 10/09/16 0346 10/10/16 0241 10/10/16 2209 10/11/16 0237 10/12/16 0320 10/13/16 0248 10/14/16 0235 10/15/16 0238  NA 134* 135  --  138 137 136 136 136  K 4.3 4.4  --  4.4 4.0 3.9 4.2 4.1  CL 103 101  --  104 102 98* 101 100*  CO2 23 24  --  31 26 28 26 25   GLUCOSE 203* 243* 400* 265* 232* 203* 179* 134*  BUN 69* 70*  --  68* 67* 69* 67* 70*  CREATININE 2.92* 2.94*  --  2.86* 2.64* 2.65* 2.63* 3.26*  CALCIUM 8.5* 8.8*  --  8.6* 8.7* 8.8* 8.6* 8.4*   CBC  Recent Labs Lab 10/12/16 0320 10/13/16 0248 10/14/16 0235 10/15/16 0238  WBC 12.2* 11.0* 9.3 10.0  HGB 9.1* 9.4* 9.6* 9.0*  HCT 29.0* 29.8* 30.6* 28.8*  MCV 90.9 90.3 91.6 92.3  PLT 323 316 338 316    Medications:    . aspirin EC  81 mg Oral Daily  . atorvastatin  80 mg Oral QHS  . carvedilol  3.125 mg  Oral BID WC  . clopidogrel  75 mg Oral Daily  . gabapentin  300 mg Oral BID  . insulin aspart  0-5 Units Subcutaneous QHS  . insulin aspart  0-9 Units Subcutaneous TID WC  . insulin aspart  4 Units Subcutaneous TID WC  . insulin glargine  35 Units Subcutaneous QHS  . isosorbide-hydrALAZINE  1 tablet Oral BID  . Living Better with Heart Failure Book   Does not apply Once  . primidone  100 mg Oral Daily  . sodium chloride flush  3 mL Intravenous Q12H  . sodium chloride flush  3 mL Intravenous Q12H      [facility] 10/15/2016, 9:48 AM

## 2016-10-15 NOTE — Progress Notes (Signed)
CARDIAC REHAB PHASE I   PRE:  Rate/Rhythm: 58 SR  BP:  Supine:   Sitting: 155/81  Standing:    SaO2: 96%RA  MODE:  Ambulation: 350 ft   POST:  Rate/Rhythm: 99 SR  BP:  Supine:   Sitting: 173/69  Standing:    SaO2: 97%RA 0845-0915 Pt walked 350 ft on RA with hand held asst with steady gait. Tolerated well. Dicussed briefly with pt watching sodium. Wife not present at this time. Noted that dietician to see pt for renal diet instruction. Would be good if wife here also as pt HOH.   Graylon Good, RN BSN  10/15/2016 9:11 AM

## 2016-10-16 LAB — CBC
HCT: 29.5 % — ABNORMAL LOW (ref 39.0–52.0)
Hemoglobin: 9.1 g/dL — ABNORMAL LOW (ref 13.0–17.0)
MCH: 28.3 pg (ref 26.0–34.0)
MCHC: 30.8 g/dL (ref 30.0–36.0)
MCV: 91.9 fL (ref 78.0–100.0)
PLATELETS: 327 10*3/uL (ref 150–400)
RBC: 3.21 MIL/uL — ABNORMAL LOW (ref 4.22–5.81)
RDW: 14.2 % (ref 11.5–15.5)
WBC: 9.8 10*3/uL (ref 4.0–10.5)

## 2016-10-16 LAB — BASIC METABOLIC PANEL
Anion gap: 10 (ref 5–15)
BUN: 81 mg/dL — AB (ref 6–20)
CALCIUM: 8.3 mg/dL — AB (ref 8.9–10.3)
CO2: 24 mmol/L (ref 22–32)
CREATININE: 3.51 mg/dL — AB (ref 0.61–1.24)
Chloride: 99 mmol/L — ABNORMAL LOW (ref 101–111)
GFR calc non Af Amer: 16 mL/min — ABNORMAL LOW (ref >60)
GFR, EST AFRICAN AMERICAN: 18 mL/min — AB (ref >60)
Glucose, Bld: 150 mg/dL — ABNORMAL HIGH (ref 65–99)
Potassium: 4.4 mmol/L (ref 3.5–5.1)
SODIUM: 133 mmol/L — AB (ref 135–145)

## 2016-10-16 LAB — GLUCOSE, CAPILLARY
GLUCOSE-CAPILLARY: 90 mg/dL (ref 65–99)
GLUCOSE-CAPILLARY: 147 mg/dL — AB (ref 65–99)
GLUCOSE-CAPILLARY: 183 mg/dL — AB (ref 65–99)
Glucose-Capillary: 111 mg/dL — ABNORMAL HIGH (ref 65–99)

## 2016-10-16 NOTE — Progress Notes (Signed)
  [facility] Progress Note    Assessment/ Plan:   1. Acute kidney injury superimposed on CKD Stage 3b-4; BL SCr around 2.5: now with CIN post LHC 9/19 1. Was improving prior to 2nd LHC and PCI 2. Now with CIN post LHC, good UOP, K Ok; GFR still declining 3. Cont hold diuretics again today 4. COnt to daily monitor 5. No RRT needed 2. NSTEMI s/p LHC with PCI 10/13/16 3. SCHF 2/2 ICM, was diuresing well and improving, diuretics on hold now 4. DM2  Subjective:    SCr up to 3.5 today, UOP 2.1L Feels great, no c/o Ambulatory around halls; singing Frederick Peers   Objective:   BP 139/74 (BP Location: Left Arm)   Pulse 88   Temp 97.9 F (36.6 C) (Oral)   Resp 17   Ht 5\' 11"  (1.803 m)   Wt 112.4 kg (247 lb 12.8 oz)   SpO2 98%   BMI 34.56 kg/m   Intake/Output Summary (Last 24 hours) at 10/16/16 0924 Last data filed at 10/16/16 0905  Gross per 24 hour  Intake              720 ml  Output             2300 ml  Net            -1580 ml   Weight change: 0.6 kg (1 lb 5.2 oz)  Physical Exam: GEN NAD HEENT EOMI PULM diminishedi n bases CV RRR no m/r/g ABD large pannus EXT trace pitting edema bilaterally NEURO AAO x 3, R arm deficits  Labs: BMET  Recent Labs Lab 10/10/16 0241 10/10/16 2209 10/11/16 0237 10/12/16 0320 10/13/16 0248 10/14/16 0235 10/15/16 0238 10/16/16 0210  NA 135  --  138 137 136 136 136 133*  K 4.4  --  4.4 4.0 3.9 4.2 4.1 4.4  CL 101  --  104 102 98* 101 100* 99*  CO2 24  --  31 26 28 26 25 24   GLUCOSE 243* 400* 265* 232* 203* 179* 134* 150*  BUN 70*  --  68* 67* 69* 67* 70* 81*  CREATININE 2.94*  --  2.86* 2.64* 2.65* 2.63* 3.26* 3.51*  CALCIUM 8.8*  --  8.6* 8.7* 8.8* 8.6* 8.4* 8.3*   CBC  Recent Labs Lab 10/13/16 0248 10/14/16 0235 10/15/16 0238 10/16/16 0210  WBC 11.0* 9.3 10.0 9.8  HGB 9.4* 9.6* 9.0* 9.1*  HCT 29.8* 30.6* 28.8* 29.5*  MCV 90.3 91.6 92.3 91.9  PLT 316 338 316 327    Medications:    . aspirin EC   81 mg Oral Daily  . atorvastatin  80 mg Oral QHS  . carvedilol  3.125 mg Oral BID WC  . clopidogrel  75 mg Oral Daily  . gabapentin  300 mg Oral BID  . insulin aspart  0-5 Units Subcutaneous QHS  . insulin aspart  0-9 Units Subcutaneous TID WC  . insulin aspart  4 Units Subcutaneous TID WC  . insulin glargine  35 Units Subcutaneous QHS  . isosorbide-hydrALAZINE  1 tablet Oral BID  . Living Better with Heart Failure Book   Does not apply Once  . primidone  100 mg Oral Daily  . sodium chloride flush  3 mL Intravenous Q12H  . sodium chloride flush  3 mL Intravenous Q12H      [facility] 10/16/2016, 9:24 AM

## 2016-10-16 NOTE — Progress Notes (Signed)
Progress Note  Patient Name: Mitchell Garcia Date of Encounter: 10/16/2016  Primary Cardiologist:  Gwinda Maine    Subjective   No CP  Breathing is OK    Inpatient Medications    Scheduled Meds: . aspirin EC  81 mg Oral Daily  . atorvastatin  80 mg Oral QHS  . carvedilol  3.125 mg Oral BID WC  . clopidogrel  75 mg Oral Daily  . gabapentin  300 mg Oral BID  . insulin aspart  0-5 Units Subcutaneous QHS  . insulin aspart  0-9 Units Subcutaneous TID WC  . insulin aspart  4 Units Subcutaneous TID WC  . insulin glargine  35 Units Subcutaneous QHS  . isosorbide-hydrALAZINE  1 tablet Oral BID  . Living Better with Heart Failure Book   Does not apply Once  . primidone  100 mg Oral Daily  . sodium chloride flush  3 mL Intravenous Q12H  . sodium chloride flush  3 mL Intravenous Q12H   Continuous Infusions: . sodium chloride    . sodium chloride    . nitroGLYCERIN Stopped (10/13/16 2300)   PRN Meds: sodium chloride, sodium chloride, acetaminophen, nitroGLYCERIN, ondansetron (ZOFRAN) IV, sodium chloride flush, sodium chloride flush   Vital Signs    Vitals:   10/15/16 1356 10/15/16 2000 10/15/16 2002 10/16/16 0345  BP: (!) 126/54  135/60 139/74  Pulse: 88     Resp: 19 20 16 17   Temp: 98.4 F (36.9 C)  98.2 F (36.8 C) 97.9 F (36.6 C)  TempSrc: Oral  Oral Oral  SpO2: 98%     Weight:    247 lb 12.8 oz (112.4 kg)  Height:        Intake/Output Summary (Last 24 hours) at 10/16/16 0940 Last data filed at 10/16/16 0905  Gross per 24 hour  Intake              720 ml  Output             2300 ml  Net            -1580 ml   Filed Weights   10/14/16 0533 10/15/16 0613 10/16/16 0345  Weight: 246 lb 14.6 oz (112 kg) 246 lb 7.6 oz (111.8 kg) 247 lb 12.8 oz (112.4 kg)    Telemetry    SR  - Personally Reviewed  ECG      Physical Exam   GEN: No acute distress.   Neck: JVP is normal  Cardiac: RRR, no murmurs, rubs, or gallops.  Respiratory: Clear to auscultation  bilaterally. GI: Soft, nontender, non-distended  MS: No edema; No deformity. Neuro:  Nonfocal  Psych: Normal affect   Labs    Chemistry Recent Labs Lab 10/14/16 0235 10/15/16 0238 10/16/16 0210  NA 136 136 133*  K 4.2 4.1 4.4  CL 101 100* 99*  CO2 26 25 24   GLUCOSE 179* 134* 150*  BUN 67* 70* 81*  CREATININE 2.63* 3.26* 3.51*  CALCIUM 8.6* 8.4* 8.3*  GFRNONAA 23* 17* 16*  GFRAA 26* 20* 18*  ANIONGAP 9 11 10      Hematology Recent Labs Lab 10/14/16 0235 10/15/16 0238 10/16/16 0210  WBC 9.3 10.0 9.8  RBC 3.34* 3.12* 3.21*  HGB 9.6* 9.0* 9.1*  HCT 30.6* 28.8* 29.5*  MCV 91.6 92.3 91.9  MCH 28.7 28.8 28.3  MCHC 31.4 31.3 30.8  RDW 14.4 14.6 14.2  PLT 338 316 327    Cardiac EnzymesNo results for input(s): TROPONINI in the last  168 hours. No results for input(s): TROPIPOC in the last 168 hours.   BNPNo results for input(s): BNP, PROBNP in the last 168 hours.   DDimer No results for input(s): DDIMER in the last 168 hours.   Radiology    No results found.  Cardiac Studies     Patient Profile     71 y.o. male with DM, HTN, CVA, prior chronic diastolic CHF, CKD stage IV, chronic anemia admitted with NSTEMI and acute systolic CHF. Underwent cath with multivessel disease. Not a surgical candidate, planned for PCI.  Assessment & Plan   1. NSTEMI/newly diagnosed CAD - peak troponin 30. CT surgery consulted but felt not to be a candidate for CABG.. On 9/19, went back to cath lab for PCI s/p orbital atherectomy with DES to ostial RCA, and PTCA/DES to distal RCA. Residual 50% prox LAD, 70% D1, CTO mLCx noted by initial diagnostic cath 10/08/16 to be treated medically. Continue ASA, Plavix, BB, statin.  2. Acute combined CHF with new systolic dysfunction - EF 35% with grade 1 DD this admission (previously normal).  Not on ACEI/ARB/ARNI/Spiro due to advanced kidney disease. Now on Bidil and Coreg. Diuretics remain on hold given #3  Net neg 15.3 L    Follow   3. AKI on  CKD stage IV-V - REnal has seen today   Good urine output  Continue to monitor    4. Essential HTN - controlled on present regimen.  5. Anemia, suspect of chronic disease -  Stable    6. DM - A1C Hgb A1c 7.6 this admission. On Lantus here with SSI. Was on Novolog as OP. Will need close OP f/u.   For questions or updates, please contact [place] Please consult [email or web address] for contact info under Cardiology/STEMI.      Signed, Dorris Carnes, MD  10/16/2016, 9:40 AM

## 2016-10-16 NOTE — Progress Notes (Signed)
Report to [address] [place] RN and patient transferred by wheelchair to [facility] with NT monitored on telemetry. Patients wife called and notified of the room the patient was sent to as she had per discussion with her this morning.

## 2016-10-16 NOTE — Progress Notes (Signed)
CARDIAC REHAB PHASE I   PRE:  Rate/Rhythm: Sr 89  BP:  Supine:   Sitting: 144/72  Standing:    SaO2: 96 RA  MODE:  Ambulation: 350 ft   POST:  Rate/Rhythm: 105  BP:  Sitting: 145/95 nurse aware administered am meds   SaO2: 97 RA  Pt up to ambulate with rehab staff 350 feet with assist on the right side.  Pt tolerated well with no complaints. Pt to side of bed.  Heart failure booklet reviewed and left with pt. Review with wife if present in room on next encounter would be appreciated by patient.  Call bell in place, pt denies any needs. [id number] Maurice Small RN, BSN Cardiac and Pulmonary Rehab Nurse Navigator

## 2016-10-16 NOTE — Plan of Care (Signed)
Problem: Food- and Nutrition-Related Knowledge Deficit (NB-1.1) Goal: Nutrition education Formal process to instruct or train a patient/client in a skill or to impart knowledge to help patients/clients voluntarily manage or modify food choices and eating behavior to maintain or improve health.  RD consulted for nutrition education regarding diabetes/CHF.  Per pt he lives with wife. His wife goes each day to stay with daughter who has ALS and he is left home alone. He does not cook much so he goes to town to eat from [employer]. We discussed easy foods to keep on hand that are appropriate for his DM and are also low in sodium.   Lab Results  Component Value Date   HGBA1C 7.6 (H) 10/05/2016    RD provided "Carbohydrate Counting for People with Diabetes" and "Low Sodium" handout from the Academy of Nutrition and Dietetics. Discussed different food groups and their effects on blood sugar, emphasizing carbohydrate-containing foods. Provided list of carbohydrates and recommended serving sizes of common foods.  Discussed importance of controlled and consistent carbohydrate intake throughout the day. Provided examples of ways to balance meals/snacks and encouraged intake of high-fiber, whole grain complex carbohydrates.  Discussed guidelines for low sodium.   Teach back method used.  Expect fair compliance.  Body mass index is 34.56 kg/m. Pt meets criteria for obesity class I based on current BMI.  Current diet order is Renal/CHO modified , patient is consuming approximately 100% of meals at this time. Labs and medications reviewed. No further nutrition interventions warranted at this time. RD contact information provided. If additional nutrition issues arise, please re-consult RD.  [place], [place], [place] Pager [phone] After Hours Pager

## 2016-10-17 DIAGNOSIS — I25119 Atherosclerotic heart disease of native coronary artery with unspecified angina pectoris: Secondary | ICD-10-CM

## 2016-10-17 LAB — GLUCOSE, CAPILLARY
GLUCOSE-CAPILLARY: 133 mg/dL — AB (ref 65–99)
Glucose-Capillary: 133 mg/dL — ABNORMAL HIGH (ref 65–99)
Glucose-Capillary: 294 mg/dL — ABNORMAL HIGH (ref 65–99)
Glucose-Capillary: 342 mg/dL — ABNORMAL HIGH (ref 65–99)

## 2016-10-17 LAB — CBC
HCT: 31.1 % — ABNORMAL LOW (ref 39.0–52.0)
HEMOGLOBIN: 9.8 g/dL — AB (ref 13.0–17.0)
MCH: 29.2 pg (ref 26.0–34.0)
MCHC: 31.5 g/dL (ref 30.0–36.0)
MCV: 92.6 fL (ref 78.0–100.0)
Platelets: 370 10*3/uL (ref 150–400)
RBC: 3.36 MIL/uL — ABNORMAL LOW (ref 4.22–5.81)
RDW: 14.3 % (ref 11.5–15.5)
WBC: 10.2 10*3/uL (ref 4.0–10.5)

## 2016-10-17 LAB — RENAL FUNCTION PANEL
ANION GAP: 9 (ref 5–15)
Albumin: 2.5 g/dL — ABNORMAL LOW (ref 3.5–5.0)
BUN: 78 mg/dL — ABNORMAL HIGH (ref 6–20)
CO2: 22 mmol/L (ref 22–32)
CREATININE: 3.11 mg/dL — AB (ref 0.61–1.24)
Calcium: 8.4 mg/dL — ABNORMAL LOW (ref 8.9–10.3)
Chloride: 102 mmol/L (ref 101–111)
GFR calc non Af Amer: 18 mL/min — ABNORMAL LOW (ref >60)
GFR, EST AFRICAN AMERICAN: 21 mL/min — AB (ref >60)
GLUCOSE: 166 mg/dL — AB (ref 65–99)
Phosphorus: 5.5 mg/dL — ABNORMAL HIGH (ref 2.5–4.6)
Potassium: 4.5 mmol/L (ref 3.5–5.1)
SODIUM: 133 mmol/L — AB (ref 135–145)

## 2016-10-17 MED ORDER — MORPHINE SULFATE (PF) 2 MG/ML IV SOLN: 2.0000 mg | INTRAVENOUS | Status: DC | PRN

## 2016-10-17 NOTE — Progress Notes (Addendum)
  [facility] Progress Note    Assessment/ Plan:   1. Acute kidney injury superimposed on CKD Stage 3b-4; BL SCr around 2.5: now with CIN post LHC 9/19 1. Was improving prior to 2nd LHC and PCI 2. Now with CIN post LHC, good UOP, K Ok; GFR still declining -- AM labs pending 3. Cont hold diuretics again today 4. No RRT needed 2. NSTEMI s/p LHC with PCI 10/13/16 3. SCHF 2/2 ICM, was diuresing well and improving, diuretics on hold now 4. DM2  Subjective:    AM RFP pending, UOP 2.4L Feels great, no c/o Ready to go home   Objective:   BP (!) 163/91   Pulse 73   Temp 98.5 F (36.9 C) (Oral)   Resp 19   Ht 5\' 11"  (1.803 m)   Wt 111.9 kg (246 lb 9.6 oz)   SpO2 99%   BMI 34.39 kg/m   Intake/Output Summary (Last 24 hours) at 10/17/16 1104 Last data filed at 10/17/16 1033  Gross per 24 hour  Intake              723 ml  Output             2000 ml  Net            -1277 ml   Weight change: -0.543 kg (-1 lb 3.2 oz)  Physical Exam: GEN NAD HEENT EOMI PULM diminishedi n bases CV RRR no m/r/g ABD large pannus EXT trace pitting edema bilaterally NEURO AAO x 3, R arm deficits  Labs: BMET  Recent Labs Lab 10/10/16 2209 10/11/16 0237 10/12/16 0320 10/13/16 0248 10/14/16 0235 10/15/16 0238 10/16/16 0210  NA  --  138 137 136 136 136 133*  K  --  4.4 4.0 3.9 4.2 4.1 4.4  CL  --  104 102 98* 101 100* 99*  CO2  --  31 26 28 26 25 24   GLUCOSE 400* 265* 232* 203* 179* 134* 150*  BUN  --  68* 67* 69* 67* 70* 81*  CREATININE  --  2.86* 2.64* 2.65* 2.63* 3.26* 3.51*  CALCIUM  --  8.6* 8.7* 8.8* 8.6* 8.4* 8.3*   CBC  Recent Labs Lab 10/14/16 0235 10/15/16 0238 10/16/16 0210 10/17/16 0702  WBC 9.3 10.0 9.8 10.2  HGB 9.6* 9.0* 9.1* 9.8*  HCT 30.6* 28.8* 29.5* 31.1*  MCV 91.6 92.3 91.9 92.6  PLT 338 316 327 370    Medications:    . aspirin EC  81 mg Oral Daily  . atorvastatin  80 mg Oral QHS  . carvedilol  3.125 mg Oral BID WC  . clopidogrel  75 mg  Oral Daily  . gabapentin  300 mg Oral BID  . insulin aspart  0-5 Units Subcutaneous QHS  . insulin aspart  0-9 Units Subcutaneous TID WC  . insulin aspart  4 Units Subcutaneous TID WC  . insulin glargine  35 Units Subcutaneous QHS  . isosorbide-hydrALAZINE  1 tablet Oral BID  . Living Better with Heart Failure Book   Does not apply Once  . primidone  100 mg Oral Daily  . sodium chloride flush  3 mL Intravenous Q12H  . sodium chloride flush  3 mL Intravenous Q12H      [facility] 10/17/2016, 11:04 AM

## 2016-10-17 NOTE — Progress Notes (Signed)
Progress Note  Patient Name: Mitchell Garcia Date of Encounter: 10/17/2016  Primary Cardiologist: Dr. Darlina Guys  Subjective   No chest pain or shortness of breath. States that his arms are sore from blood draws and prior IV insertion. Eager to go home.  Inpatient Medications    Scheduled Meds: . aspirin EC  81 mg Oral Daily  . atorvastatin  80 mg Oral QHS  . carvedilol  3.125 mg Oral BID WC  . clopidogrel  75 mg Oral Daily  . gabapentin  300 mg Oral BID  . insulin aspart  0-5 Units Subcutaneous QHS  . insulin aspart  0-9 Units Subcutaneous TID WC  . insulin aspart  4 Units Subcutaneous TID WC  . insulin glargine  35 Units Subcutaneous QHS  . isosorbide-hydrALAZINE  1 tablet Oral BID  . Living Better with Heart Failure Book   Does not apply Once  . primidone  100 mg Oral Daily  . sodium chloride flush  3 mL Intravenous Q12H  . sodium chloride flush  3 mL Intravenous Q12H   Continuous Infusions: . sodium chloride    . sodium chloride    . nitroGLYCERIN Stopped (10/13/16 2300)   PRN Meds: sodium chloride, sodium chloride, acetaminophen, nitroGLYCERIN, ondansetron (ZOFRAN) IV, sodium chloride flush, sodium chloride flush   Vital Signs    Vitals:   10/16/16 1729 10/16/16 2050 10/17/16 0554 10/17/16 0852  BP: 138/63 (!) 143/73 (!) 153/83 (!) 163/91  Pulse: 79 (!) 52 61 73  Resp:  (!) 22 19   Temp:  (!) 97.3 F (36.3 C) 98.5 F (36.9 C)   TempSrc:  Oral Oral   SpO2:  98% 99%   Weight:   246 lb 9.6 oz (111.9 kg)   Height:        Intake/Output Summary (Last 24 hours) at 10/17/16 1132 Last data filed at 10/17/16 1033  Gross per 24 hour  Intake              723 ml  Output             2000 ml  Net            -1277 ml   Filed Weights   10/15/16 0613 10/16/16 0345 10/17/16 0554  Weight: 246 lb 7.6 oz (111.8 kg) 247 lb 12.8 oz (112.4 kg) 246 lb 9.6 oz (111.9 kg)    Telemetry    Sinus rhythm. Brief burst of NSVT. Personally reviewed.  Physical Exam    GEN: Obese elderly male.No acute distress.   Neck: No JVD. Cardiac: RRR, soft systolic murmur, no gallop.  Respiratory: Nonlabored. Decreased breath sounds without wheezing. GI: Soft, nontender, bowel sounds present. MS: Ecchymosis left forearm, no pitting edema.; No deformity. Neuro:  Nonfocal. Psych: Alert and oriented x 3. Normal affect.  Labs    Chemistry  Recent Labs Lab 10/15/16 0238 10/16/16 0210 10/17/16 1006  NA 136 133* 133*  K 4.1 4.4 4.5  CL 100* 99* 102  CO2 25 24 22   GLUCOSE 134* 150* 166*  BUN 70* 81* 78*  CREATININE 3.26* 3.51* 3.11*  CALCIUM 8.4* 8.3* 8.4*  ALBUMIN  --   --  2.5*  GFRNONAA 17* 16* 18*  GFRAA 20* 18* 21*  ANIONGAP 11 10 9      Hematology  Recent Labs Lab 10/15/16 0238 10/16/16 0210 10/17/16 0702  WBC 10.0 9.8 10.2  RBC 3.12* 3.21* 3.36*  HGB 9.0* 9.1* 9.8*  HCT 28.8* 29.5* 31.1*  MCV 92.3  91.9 92.6  MCH 28.8 28.3 29.2  MCHC 31.3 30.8 31.5  RDW 14.6 14.2 14.3  PLT 316 327 370    Cardiac Studies   Cardiac catheterization 10/08/2016: Conclusions: 1. Multivessel coronary artery disease, including 50% proximal LAD stenosis, 70% proximal D1 lesion, chronic total occlusion of mid LCx, and sequential 80% ostial and 95% distal RCA stenoses. 2. Moderate to severely elevated left ventricular filling pressure.  Recommendations: 1. Cardiac surgery consultation for CABG, given complex multivessel coronary artery disease, diabetes mellitus, and reduced LVEF. If the patient is deemed a poor surgical candidate, consider PCI to RCA (this would likely require atherectomy of the ostial lesion). 2. Restart heparin infusion 4 hours following TR band deflation.  3. Continue diuresis and optimization of heart failure therapy.   Cardiac catheterization and PCI 10/13/2016: 1. Severe, calcified stenosis in the ostium of the RCA.  2. Successful orbital atherectomy of the ostial RCA with placement of a Synergy drug eluting stent 3. Severe distal  RCA stenosis.  4. Successful PTCA/DES x 1 distal RCA (Synergy DES).   Echocardiogram 10/04/2016: Study Conclusions  - Left ventricle: Poor acoustic windows limit studly Defiinity   used. LVEF is approximately 35% with diffuse hypokinesis. The   cavity size was mildly dilated. Wall thickness was increased in a   pattern of mild LVH. Doppler parameters are consistent with   abnormal left ventricular relaxation (grade 1 diastolic   dysfunction). Doppler parameters are consistent with high   ventricular filling pressure.  Patient Profile     71 y.o. male with a history of type 2 diabetes mellitus, hypertension, stroke, CKD stage 3-4, hypertension, and chronic diastolic heart failure, presenting with NSTEMI and status post cardiac catheterization with PCI as detailed above. He has acute on chronic renal failure and is been followed by nephrology at this point.  Assessment & Plan    1. NSTEMI, peak troponin 30. Patient found to have multivessel CAD but felt to be poor candidate for CABG per [facility]CTS consultation. He is now status post PCI with orbital atherectomy and DES to the ostial RCA as well as DES to the distal RCA on September 19 with residual disease to be managed medically including CTO of the mid circumflex.  2. Acute on chronic renal failure, baseline CKD stage 3-4. Creatinine has come down to 3.1 and he has good urine output.  3. Acute on chronic combined heart failure, LVEF 35% by recent echocardiogram. Diuretics are on hold in light of acute on chronic renal failure. Still with net output greater than intake.  4. Type 2 diabetes mellitus with hemoglobin A1c 7.6%. He continues on insulin.  5. Essential hypertension, systolic blood pressure in the 160s.  I reviewed the chart and interval plan. Discussed with patient and family. Creatinine is just starting to come down today. He was seen by Nephrology with plan to continue to hold off on diuretics. Cardiac regimen includes aspirin,  Plavix, Lipitor, Coreg, and BiDil. Would watch today, continue to work with cardiac rehabilitation. Expect discharge tomorrow if renal function stable or improving and urine output remains good. He will need to stay off diuretics in the short-term, treatment of his cardiomyopathy will be somewhat more difficult in the face of CKD. Will need to have follow close follow-up with Dr. Angelena Form or APP after discharge.  Signed, Rozann Lesches, MD  10/17/2016, 11:32 AM

## 2016-10-17 NOTE — Plan of Care (Signed)
Problem: Education: Goal: Understanding of medication regimen will improve Outcome: Progressing Educated patient on importance of new medications and compliance with prescribed treatment regimen. Patient verbalizes understanding

## 2016-10-18 ENCOUNTER — Encounter (HOSPITAL_COMMUNITY): Payer: Self-pay | Admitting: Cardiology

## 2016-10-18 ENCOUNTER — Other Ambulatory Visit: Payer: Self-pay | Admitting: Cardiology

## 2016-10-18 DIAGNOSIS — I10 Essential (primary) hypertension: Secondary | ICD-10-CM

## 2016-10-18 DIAGNOSIS — Z79899 Other long term (current) drug therapy: Secondary | ICD-10-CM

## 2016-10-18 DIAGNOSIS — I219 Acute myocardial infarction, unspecified: Secondary | ICD-10-CM | POA: Insufficient documentation

## 2016-10-18 DIAGNOSIS — Z23 Encounter for immunization: Secondary | ICD-10-CM | POA: Diagnosis not present

## 2016-10-18 LAB — GLUCOSE, CAPILLARY
Glucose-Capillary: 154 mg/dL — ABNORMAL HIGH (ref 65–99)
Glucose-Capillary: 132 mg/dL — ABNORMAL HIGH (ref 65–99)

## 2016-10-18 LAB — CBC
HCT: 29 % — ABNORMAL LOW (ref 39.0–52.0)
Hemoglobin: 9 g/dL — ABNORMAL LOW (ref 13.0–17.0)
MCH: 28.6 pg (ref 26.0–34.0)
MCHC: 31 g/dL (ref 30.0–36.0)
MCV: 92.1 fL (ref 78.0–100.0)
PLATELETS: 328 10*3/uL (ref 150–400)
RBC: 3.15 MIL/uL — ABNORMAL LOW (ref 4.22–5.81)
RDW: 14.3 % (ref 11.5–15.5)
WBC: 9.2 10*3/uL (ref 4.0–10.5)

## 2016-10-18 LAB — BASIC METABOLIC PANEL
Anion gap: 8 (ref 5–15)
BUN: 80 mg/dL — AB (ref 6–20)
CO2: 25 mmol/L (ref 22–32)
CREATININE: 3.07 mg/dL — AB (ref 0.61–1.24)
Calcium: 8.4 mg/dL — ABNORMAL LOW (ref 8.9–10.3)
Chloride: 101 mmol/L (ref 101–111)
GFR calc Af Amer: 22 mL/min — ABNORMAL LOW (ref >60)
GFR, EST NON AFRICAN AMERICAN: 19 mL/min — AB (ref >60)
Glucose, Bld: 187 mg/dL — ABNORMAL HIGH (ref 65–99)
Potassium: 4.7 mmol/L (ref 3.5–5.1)
SODIUM: 134 mmol/L — AB (ref 135–145)

## 2016-10-18 LAB — MAGNESIUM: MAGNESIUM: 2.3 mg/dL (ref 1.7–2.4)

## 2016-10-18 MED ORDER — CARVEDILOL 3.125 MG PO TABS: 3.1250 mg | ORAL_TABLET | Freq: Two times a day (BID) | ORAL | 0 refills | Status: DC

## 2016-10-18 MED ORDER — CLOPIDOGREL BISULFATE 75 MG PO TABS: 75.0000 mg | ORAL_TABLET | Freq: Every day | ORAL | 2 refills | Status: DC

## 2016-10-18 MED ORDER — FUROSEMIDE 40 MG PO TABS
40.0000 mg | ORAL_TABLET | Freq: Every day | ORAL | Status: DC
  Administered 2016-10-18: 40 mg via ORAL
  Filled 2016-10-18: qty 1

## 2016-10-18 MED ORDER — CLOPIDOGREL BISULFATE 75 MG PO TABS
75.0000 mg | ORAL_TABLET | Freq: Every day | ORAL | 0 refills | Status: DC
Start: 2016-10-19 — End: 2016-10-18

## 2016-10-18 MED ORDER — NITROGLYCERIN 0.4 MG SL SUBL: 0.4000 mg | SUBLINGUAL_TABLET | SUBLINGUAL | 2 refills | Status: AC | PRN

## 2016-10-18 MED ORDER — FUROSEMIDE 40 MG PO TABS: 40.0000 mg | ORAL_TABLET | Freq: Every day | ORAL | 0 refills | Status: DC

## 2016-10-18 MED ORDER — ISOSORB DINITRATE-HYDRALAZINE 20-37.5 MG PO TABS: 1.0000 | ORAL_TABLET | Freq: Two times a day (BID) | ORAL | 0 refills | Status: DC

## 2016-10-18 NOTE — Discharge Summary (Addendum)
The patient has been seen in conjunction with [name], NP-C. All aspects of care have been considered and discussed. The patient has been personally interviewed, examined, and all clinical data has been reviewed.   Complicated situation in this gentleman who had staged multi- lesion PCI of RCA for non-ST elevation MI.  Acute on chronic kidney disease improving at the time of discharge with reinstitution of diuretic therapy at a lower dose than usual per nephrology.  Acute on chronic combined systolic and diastolic heart failure with reinstitution of diuretic therapy of the time of discharge.  Will be discharged on dual antiplatelet therapy.  Will need follow-up basic metabolic panel in 99-37 hours. Will also need nephrology follow-up in that time frame.       Discharge Summary    [name],  MRN: [id number], DOB/AGE: 05/15/1945 71 y.o.  Admit date: 10/04/2016 Discharge date: 10/18/2016  Primary Care Provider: [facility] Primary Cardiologist: [facility]  Discharge Diagnoses    Active Problems:   NSTEMI (non-ST elevated myocardial infarction) [facility])   Ischemic cardiomyopathy   Anemia of chronic disease   Acute combined systolic (congestive) and diastolic (congestive) heart failure (HCC)   CAD S/P percutaneous coronary angioplasty   Acute kidney injury superimposed on chronic kidney disease ([place])   Uncontrolled diabetes mellitus ([place])   Essential hypertension   MI (myocardial infarction) ([place])   Allergies Allergies  Allergen Reactions  . Lisinopril     Hyperkalemia   . Losartan     Hyperkalemia     Diagnostic Studies/Procedures    Cardiac catheterization 10/08/2016: Conclusions: 1. Multivessel coronary artery disease, including 50% proximal LAD stenosis, 70% proximal D1 lesion, chronic total occlusion of mid LCx, and sequential 80% ostial and 95% distal RCA stenoses. 2. Moderate to severely elevated left ventricular filling  pressure.  Recommendations: 6. Cardiac surgery consultation for CABG, given complex multivessel coronary artery disease, diabetes mellitus, and reduced LVEF. If the patient is deemed a poor surgical candidate, consider PCI to RCA (this would likely require atherectomy of the ostial lesion). 7. Restart heparin infusion 4 hours following TR band deflation.  8. Continue diuresis and optimization of heart failure therapy.   Cardiac catheterization and PCI 10/13/2016: 1. Severe, calcified stenosis in the ostium of the RCA.  2. Successful orbital atherectomy of the ostial RCA with placement of a Synergy drug eluting stent 3. Severe distal RCA stenosis.  4. Successful PTCA/DES x 1 distal RCA (Synergy DES).   Echocardiogram 10/04/2016: Study Conclusions  - Left ventricle: Poor acoustic windows limit studly Defiinity used. LVEF is approximately 35% with diffuse hypokinesis. The cavity size was mildly dilated. Wall thickness was increased in a pattern of mild LVH. Doppler parameters are consistent with abnormal left ventricular relaxation (grade 1 diastolic dysfunction). Doppler parameters are consistent with high ventricular filling pressure. _____________   History of Present Illness     Mitchell Garcia is a 71 yo male with PMH of NIDDM, HTN, Stroke, CKD, HTN, chronic diastolic CHF who is followed by the [place] in [place]. Denied any known history of CAD and [facility] Never had a cath or stress test to his knowledge. Had an echo in 1/15 that showed normal EF with G2DD.   Reported he was in his usual state of health up until about 3 weeks prior to admission. Began to experience orthopnea and PND with abdominal and lower extremity edema. Also reported a nonproductive cough. Stateed he was attempting  to treat with over-the-counter cough medicine and cough drops without much relief. Also reported intermittent episodes of chest burning not  associated with rest or activity. Presented to the [facility] for an appointment the day of admission, and was transferred to [facility] for further workup.   In the ED his labs showed stable electrolytes, creatinine 2.9 (baseline appears 2.0~2.9), hemoglobin 11.3, POC troponin 13.35. EKG showed possible atrial fibrillation, but P waves are apparent in several leads. Chest x-ray with small left pleural effusion, and BNP 943. Patient reported no chest pain/burning. Breathing comfortably on room air at the time of admission.   Hospital Course     Consultants: Nephrology, TCTS   He was admitted and placed on IV nitro and heparin. He was started on IV lasix with some diuresis, but significantly improved with the addition of metolazone. Echo showed severely reduced EF of 35% with diffuse hypokinesis. Nephrology was consulted in regards to need for possible cath. Troponin peaked at 30.58. He underwent diagnostic cath noting significant 3v disease and TCTS was consulted. He was not considered a surgical candidate, and plans were made to attempt PCI/artherecomy of the RCA. Post cath he was transitioned to BiDil. Diuretics were held prior to repeat cath. Underwent successful orbital atherectomy/DES to the osital/distal RCA. Plan for medical therapy for residual CAD. Post cath Cr rose to 3.5 and dieretics were held by nephrology. He continued to have good UOP without the use of lasix. Hgb A1c 7.6 with SSI/lantus adjusted as needed. Worked well with cardiac rehab and PT with improvement in his strength. Cr trended back down to 3.07, ad Hgb remained stable. He diuresis a total of -17.6 L this admission with weight trending down to 246lbs and maintaining. Nephrology recommended to resume Lasix at 40mg  daily prior to discharge. Tolerated new medications started this admission which included Coreg 3.125mg  BID, ASA, Plavix, BiDil and Lipitor.   General: Well developed, well nourished, male appearing in no acute  distress. Head: Normocephalic, atraumatic.  Neck: Supple without bruits, JVD. Lungs:  Resp regular and unlabored, CTA. Heart: RRR, S1, S2, no S3, S4, or murmur; no rub. Abdomen: Soft, non-tender, non-distended with normoactive bowel sounds. No hepatomegaly. No rebound/guarding. No obvious abdominal masses. Extremities: No clubbing, cyanosis, 1+ bilateral LE edema. Distal pedal pulses are 2+ bilaterally. Neuro: Alert and oriented X 3. Moves all extremities spontaneously. Psych: Normal affect.  [name] was seen by Dr. Tamala Julian and determined stable for discharge home. Follow up in the office has been arranged. Medications are listed below.   _____________  Discharge Vitals Blood pressure 133/60, pulse (!) 57, temperature (!) 97.1 F (36.2 C), temperature source Oral, resp. rate 16, height 5\' 11"  (1.803 m), weight 246 lb 0.5 oz (111.6 kg), SpO2 93 %.  Filed Weights   10/16/16 0345 10/17/16 0554 10/18/16 0417  Weight: 247 lb 12.8 oz (112.4 kg) 246 lb 9.6 oz (111.9 kg) 246 lb 0.5 oz (111.6 kg)    Labs & Radiologic Studies    CBC  Recent Labs  10/17/16 0702 10/18/16 0304  WBC 10.2 9.2  HGB 9.8* 9.0*  HCT 31.1* 29.0*  MCV 92.6 92.1  PLT 370 725   Basic Metabolic Panel  Recent Labs  10/17/16 1006 10/18/16 0304  NA 133* 134*  K 4.5 4.7  CL 102 101  CO2 22 25  GLUCOSE 166* 187*  BUN 78* 80*  CREATININE 3.11* 3.07*  CALCIUM 8.4* 8.4*  MG  --  2.3  PHOS 5.5*  --  Liver Function Tests  Recent Labs  10/17/16 1006  ALBUMIN 2.5*   No results for input(s): LIPASE, AMYLASE in the last 72 hours. Cardiac Enzymes No results for input(s): CKTOTAL, CKMB, CKMBINDEX, TROPONINI in the last 72 hours. BNP Invalid input(s): POCBNP D-Dimer No results for input(s): DDIMER in the last 72 hours. Hemoglobin A1C No results for input(s): HGBA1C in the last 72 hours. Fasting Lipid Panel No results for input(s): CHOL, HDL, LDLCALC, TRIG, CHOLHDL, LDLDIRECT in the last 72  hours. Thyroid Function Tests No results for input(s): TSH, T4TOTAL, T3FREE, THYROIDAB in the last 72 hours.  Invalid input(s): FREET3 _____________  Dg Chest 2 View  Result Date: 10/04/2016 CLINICAL DATA:  Shortness of Breath EXAM: CHEST  2 VIEW COMPARISON:  January 25, 2013 FINDINGS: There is a small left pleural effusion. There is no edema or consolidation. Heart is borderline enlarged with pulmonary venous hypertension. No adenopathy. There is aortic atherosclerosis. No bone lesions. IMPRESSION: Pulmonary vascular congestion. Small left pleural effusion. No edema or consolidation. There is aortic atherosclerosis. Aortic Atherosclerosis (ICD10-I70.0). Electronically Signed   By: Lowella Grip III M.D.   On: 10/04/2016 15:16   Disposition   Pt is being discharged home today in good condition.  Follow-up Plans & Appointments    Follow-up Information    Charlie Pitter, PA-C Follow up on 10/25/2016.   Specialties:  Cardiology, Radiology Why:  at 10:30am for your follow up appt.  Contact information: [address] [place] [address] [phone]        [facility] Follow up.   Specialty:  Nephrology Why:  The office will cal you with a follow up appt.  Contact information: [address] [place] [address] [phone]        [place] [facility] Follow up on 10/21/2016.   Specialty:  Cardiology Why:  please come in for labs Contact information: [address], [place] [phone]         Discharge Instructions    ([place]) Call MD:  Anytime you have any of the following symptoms: 1) 3 pound weight gain in 24 hours or 5 pounds in 1 week 2) shortness of breath, with or without a dry hacking cough 3) swelling in the hands, feet or stomach 4) if you have to sleep on extra pillows at night in order to breathe.    Complete by:  As  directed    Amb Referral to Cardiac Rehabilitation    Complete by:  As directed    Diagnosis:   Coronary Stents NSTEMI     Call MD for:  redness, tenderness, or signs of infection (pain, swelling, redness, odor or green/yellow discharge around incision site)    Complete by:  As directed    Diet - low sodium heart healthy    Complete by:  As directed    Discharge instructions    Complete by:  As directed    Groin Site Care Refer to this sheet in the next few weeks. These instructions provide you with information on caring for yourself after your procedure. Your caregiver may also give you more specific instructions. Your treatment has been planned according to current medical practices, but problems sometimes occur. Call your caregiver if you have any problems or questions after your procedure. HOME CARE INSTRUCTIONS You may shower 24 hours after the procedure. Remove the bandage (dressing) and gently wash the site with  plain soap and water. Gently pat the site dry.  Do not apply powder or lotion to the site.  Do not sit in a bathtub, swimming pool, or whirlpool for 5 to 7 days.  No bending, squatting, or lifting anything over 10 pounds (4.5 kg) as directed by your caregiver.  Inspect the site at least twice daily.  Do not drive home if you are discharged the same day of the procedure. Have someone else drive you.  You may drive 24 hours after the procedure unless otherwise instructed by your caregiver.  What to expect: Any bruising will usually fade within 1 to 2 weeks.  Blood that collects in the tissue (hematoma) may be painful to the touch. It should usually decrease in size and tenderness within 1 to 2 weeks.  SEEK IMMEDIATE MEDICAL CARE IF: You have unusual pain at the groin site or down the affected leg.  You have redness, warmth, swelling, or pain at the groin site.  You have drainage (other than a small amount of blood on the dressing).  You have chills.  You have a fever or  persistent symptoms for more than 72 hours.  You have a fever and your symptoms suddenly get worse.  Your leg becomes pale, cool, tingly, or numb.  You have heavy bleeding from the site. Hold pressure on the site. .  For patients with congestive heart failure, we give them these special instructions:  1. Follow a low-salt diet and watch your fluid intake. In general, you should not be taking in more than 2 liters of fluid per day (no more than 8 glasses per day). Some patients are restricted to less than 1.5 liters of fluid per day (no more than 6 glasses per day). This includes sources of water in foods like soup, coffee, tea, milk, etc. 2. Weigh yourself on the same scale at same time of day and keep a log. 3. Call your doctor: (Anytime you feel any of the following symptoms)  - 3-4 pound weight gain in 1-2 days or 2 pounds overnight  - Shortness of breath, with or without a dry hacking cough  - Swelling in the hands, feet or stomach  - If you have to sleep on extra pillows at night in order to breathe   IT IS IMPORTANT TO LET YOUR DOCTOR KNOW EARLY ON IF YOU ARE HAVING SYMPTOMS SO WE CAN HELP YOU!  PLEASE DO NOT MISS ANY DOSES OF YOUR PLAVIX!!!!! Also keep a log of you blood pressures and bring back to your follow up appt. Please call the office with any questions.   Patients taking blood thinners should generally stay away from medicines like ibuprofen, Advil, Motrin, naproxen, and Aleve due to risk of stomach bleeding. You may take Tylenol as directed or talk to your primary doctor about alternatives.   Increase activity slowly    Complete by:  As directed       Discharge Medications     Medication List    STOP taking these medications   amLODipine 10 MG tablet Commonly known as:  NORVASC   calcitRIOL 0.25 MCG capsule Commonly known as:  ROCALTROL   lisinopril 40 MG tablet Commonly known as:  PRINIVIL,ZESTRIL   oxyCODONE-acetaminophen 5-325 MG tablet Commonly known as:   PERCOCET/ROXICET   sevelamer carbonate 800 MG tablet Commonly known as:  RENVELA     TAKE these medications   ALPRAZolam 0.5 MG tablet Commonly known as:  XANAX Take 1 tablet (0.5 mg total)  by mouth at bedtime as needed for anxiety.   aspirin EC 81 MG tablet Take 81 mg by mouth daily.   atorvastatin 80 MG tablet Commonly known as:  LIPITOR Take 80 mg by mouth at bedtime.   carvedilol 3.125 MG tablet Commonly known as:  COREG Take 1 tablet (3.125 mg total) by mouth 2 (two) times daily with a meal.   clopidogrel 75 MG tablet Commonly known as:  PLAVIX Take 1 tablet (75 mg total) by mouth daily.   DSS 100 MG Caps Take 100 mg by mouth 2 (two) times daily.   furosemide 40 MG tablet Commonly known as:  LASIX Take 1 tablet (40 mg total) by mouth daily. What changed:  medication strength  how much to take   gabapentin 300 MG capsule Commonly known as:  NEURONTIN Take 300 mg by mouth 2 (two) times daily.   insulin aspart protamine - aspart (70-30) 100 UNIT/ML FlexPen Commonly known as:  NOVOLOG 70/30 MIX Inject 95 Units into the skin 2 (two) times daily.   isosorbide-hydrALAZINE 20-37.5 MG tablet Commonly known as:  BIDIL Take 1 tablet by mouth 2 (two) times daily.   nitroGLYCERIN 0.4 MG SL tablet Commonly known as:  NITROSTAT Place 1 tablet (0.4 mg total) under the tongue every 5 (five) minutes x 3 doses as needed for chest pain.   primidone 50 MG tablet Commonly known as:  MYSOLINE Take 100 mg by mouth daily.   traMADol 50 MG tablet Commonly known as:  ULTRAM Take 2 tablets (100 mg total) by mouth 2 (two) times daily.   vitamin B-12 500 MCG tablet Commonly known as:  CYANOCOBALAMIN Take 1,000 mcg by mouth daily.        Aspirin prescribed at discharge?  Yes High Intensity Statin Prescribed? (Lipitor 40-80mg  or Crestor 20-40mg ): Yes Beta Blocker Prescribed? Yes For EF <40%, was ACEI/ARB Prescribed? No: CKD ADP Receptor Inhibitor Prescribed? (i.e. Plavix  etc.-Includes Medically Managed Patients): Yes For EF <40%, Aldosterone Inhibitor Prescribed? No: CKD Was EF assessed during THIS hospitalization? Yes Was Cardiac Rehab II ordered? (Included Medically managed Patients): Yes   Outstanding Labs/Studies   BMET at follow up appt. Consider echo in 6-8 weeks to reassess EF function.   Duration of Discharge Encounter   Greater than 30 minutes including physician time.  Signed, [name] NP-C 10/18/2016, 2:13 PM

## 2016-10-18 NOTE — Care Management Important Message (Signed)
Important Message  Patient Details  Name: Mitchell Garcia MRN: [id number] Date of Birth: Sep 24, 1945   Medicare Important Message Given:  Yes    Nathen May 10/18/2016, 10:44 AM

## 2016-10-18 NOTE — Progress Notes (Signed)
Patient and family received discharge information and acknowledged understanding of it. RN answered questions that were asked by family. Patient IV was removed.

## 2016-10-18 NOTE — Progress Notes (Signed)
[facility] Progress Note    Assessment/ Plan:   1. Acute kidney injury superimposed on CKD Stage 3b-4; BL SCr around 2.5: now with CIN post LHC 9/19 1. Was improving prior to 2nd LHC and PCI 2. Now with CIN post LHC, good UOP, K Ok; GFR has turned the corner- improving again 3. holding diuretics but now with edema- would add lasix on at 40 q day- (1/2 previous dose) 4. No RRT needed 2. NSTEMI s/p LHC times 2 (9/14 and 9/19)  with PCI 10/13/16 3. SCHF 2/2 ICM, was diuresing well and improving, diuretics on hold now 4. DM2  Subjective:    crt improving last 2 days but still not great, UOP 1.7L Feels great, no c/o Ready to go home- according to family member will follow up at [place] a couple times before turning care over to [place] ?  I will arrange his follow up    Objective:   BP 140/71   Pulse (!) 57   Temp 97.7 F (36.5 C) (Oral)   Resp 16   Ht 5\' 11"  (1.803 m)   Wt 111.6 kg (246 lb 0.5 oz)   SpO2 93%   BMI 34.31 kg/m   Intake/Output Summary (Last 24 hours) at 10/18/16 1047 Last data filed at 10/18/16 0800  Gross per 24 hour  Intake              720 ml  Output             1750 ml  Net            -1030 ml   Weight change: -0.257 kg (-9.1 oz)  Physical Exam: GEN NAD HEENT EOMI PULM diminishedi n bases CV RRR no m/r/g ABD large pannus EXT 1+ pitting edema bilaterally NEURO AAO x 3, R arm deficits  Labs: BMET  Recent Labs Lab 10/12/16 0320 10/13/16 0248 10/14/16 0235 10/15/16 0238 10/16/16 0210 10/17/16 1006 10/18/16 0304  NA 137 136 136 136 133* 133* 134*  K 4.0 3.9 4.2 4.1 4.4 4.5 4.7  CL 102 98* 101 100* 99* 102 101  CO2 26 28 26 25 24 22 25   GLUCOSE 232* 203* 179* 134* 150* 166* 187*  BUN 67* 69* 67* 70* 81* 78* 80*  CREATININE 2.64* 2.65* 2.63* 3.26* 3.51* 3.11* 3.07*  CALCIUM 8.7* 8.8* 8.6* 8.4* 8.3* 8.4* 8.4*  PHOS  --   --   --   --   --  5.5*  --    CBC  Recent Labs Lab 10/15/16 0238 10/16/16 0210 10/17/16 0702  10/18/16 0304  WBC 10.0 9.8 10.2 9.2  HGB 9.0* 9.1* 9.8* 9.0*  HCT 28.8* 29.5* 31.1* 29.0*  MCV 92.3 91.9 92.6 92.1  PLT 316 327 370 328    Medications:    . aspirin EC  81 mg Oral Daily  . atorvastatin  80 mg Oral QHS  . carvedilol  3.125 mg Oral BID WC  . clopidogrel  75 mg Oral Daily  . gabapentin  300 mg Oral BID  . insulin aspart  0-5 Units Subcutaneous QHS  . insulin aspart  0-9 Units Subcutaneous TID WC  . insulin aspart  4 Units Subcutaneous TID WC  . insulin glargine  35 Units Subcutaneous QHS  . isosorbide-hydrALAZINE  1 tablet Oral BID  . Living Better with Heart Failure Book   Does not apply Once  . primidone  100 mg Oral Daily  . sodium chloride flush  3 mL Intravenous Q12H  .  sodium chloride flush  3 mL Intravenous Q12H      Leata Dominy A  [facility] 10/18/2016, 10:47 AM

## 2016-10-18 NOTE — Care Management Note (Signed)
Case Management Note  Patient Details  Name: KWAN SHELLHAMMER MRN: [id number] Date of Birth: 23-Jun-1945  Subjective/Objective: Pt presented for Nstemi. Plan for d/c home today with wife. Pt has PCP Romero Liner at the [facility]. Pt has a local pharmacy to get medications in for home.                     Action/Plan: CM to fax Rx's to [facility]. Plans will be for home today. CM did speak with pt/family in regards to [place]. No HH Services needed at this time. Pt was only concerned with getting the patient in the home. CM did ask and she has family that will be able to assist them once she gets home. No further needs from CM at this time.   Expected Discharge Date:  10/18/16               Expected Discharge Plan:  Home/Self Care  In-House Referral:  NA  Discharge planning Services  CM Consult  Post Acute Care Choice:  NA Choice offered to:  NA  DME Arranged:  N/A DME Agency:  NA  HH Arranged:  NA HH Agency:  NA  Status of Service:  Completed, signed off  If discussed at [place] of Stay Meetings, dates discussed:    Additional Comments:  Bethena Roys, RN 10/18/2016, 2:56 PM

## 2016-10-18 NOTE — Progress Notes (Signed)
CARDIAC REHAB PHASE I   PRE:  Rate/Rhythm: 42 SB  BP:  Supine:   Sitting: 145/72  Standing:    SaO2: 93%RA  MODE:  Ambulation: 470 ft   POST:  Rate/Rhythm: 79 SR  BP:  Supine:   Sitting: 155/64  Standing:    SaO2: 97%RA 1050-1125 Pt walked 470 ft on RA with rolling walker with steady gait. Pt's wife requested that we use walker as he did not walk yesterday and she was concerned he may be unsteady. Tolerated well. To recliner. Asked wife about the CHF booklet and if she had read it yet. She was looking at it when we returned from walk. Reinforced the daily weights and when to call MD. Also signs/symptoms of CHF. Reminded pt to watch sodium also. Noted he has seen dietitian re diabetes and sodium.   Graylon Good, RN BSN  10/18/2016 11:17 AM

## 2016-10-18 NOTE — Progress Notes (Signed)
Occupational Therapy Treatment Patient Details Name: Mitchell Garcia MRN: [id number] DOB: 14-Jul-1945 Today's Date: 10/18/2016    History of present illness Mitchell Garcia is a 71 y.o. male with a history of NIDDM, HTN, Stroke, CKD, HTN, chronic diastolic CHF who presented from the [facility] office with progressive dyspnea. NSTEMI   OT comments  Pt reporting he needs assist of staff and wife for ADL due to his L arm being painful. RN reports wife has even been feeding him. Pt requiring supervision for OOB mobility, slightly unsteady upon initially standing. Recommending walker use when he first returns home. Pt prefers to rely on his wife to bathe and dress him. Educated on benefits of pt participating to increase endurance and decrease burden on his wife. Pt and wife not very receptive.   Follow Up Recommendations  Other (comment) (cardiac rehab)    Equipment Recommendations  None recommended by OT    Recommendations for Other Services      Precautions / Restrictions Precautions Precautions: Fall Restrictions Weight Bearing Restrictions: No       Mobility Bed Mobility Overal bed mobility: Modified Independent                Transfers   Equipment used: None Transfers: Sit to/from Stand Sit to Stand: Supervision         General transfer comment: pt able to transfer without assist or cues    Balance Overall balance assessment: Needs assistance   Sitting balance-Leahy Scale: Good     Standing balance support: During functional activity Standing balance-Leahy Scale: Fair Standing balance comment: unsteady upon initially standing, but did not require assist                           ADL either performed or assessed with clinical judgement   ADL Overall ADL's : Needs assistance/impaired                 Upper Body Dressing : Set up;Sitting       Toilet Transfer: Supervision/safety;Ambulation;Comfort height toilet Toilet Transfer  Details (indicate cue type and reason): instructed pt to call nursing to assist to bathroom, avoid use of BSC Toileting- Clothing Manipulation and Hygiene: Supervision/safety;Sit to/from stand       Functional mobility during ADLs: Min guard General ADL Comments: Pt and wife educated in importance of pt participating maximally in ADL to decrease burden of care and increase activity tolerance.      Vision       Perception     Praxis      Cognition Arousal/Alertness: Awake/alert Behavior During Therapy: WFL for tasks assessed/performed Overall Cognitive Status: Within Functional Limits for tasks assessed                                          Exercises     Shoulder Instructions       General Comments      Pertinent Vitals/ Pain       Pain Assessment: No/denies pain  Home Living                                          Prior Functioning/Environment              Frequency  Min 2X/week        Progress Toward Goals  OT Goals(current goals can now be found in the care plan section)  Progress towards OT goals: Progressing toward goals  Acute Rehab OT Goals Patient Stated Goal: get back to normal  OT Goal Formulation: With patient/family Time For Goal Achievement: 10/25/16 Potential to Achieve Goals: Good  Plan Discharge plan remains appropriate    Co-evaluation                 AM-PAC PT "6 Clicks" Daily Activity     Outcome Measure   Help from another person eating meals?: None Help from another person taking care of personal grooming?: A Little Help from another person toileting, which includes using toliet, bedpan, or urinal?: A Little Help from another person bathing (including washing, rinsing, drying)?: A Little Help from another person to put on and taking off regular upper body clothing?: None Help from another person to put on and taking off regular lower body clothing?: A Little 6 Click Score:  20    End of Session Equipment Utilized During Treatment: Gait belt  OT Visit Diagnosis: Unsteadiness on feet (R26.81)   Activity Tolerance Patient tolerated treatment well   Patient Left in chair;with call bell/phone within reach;with family/visitor present   Nurse Communication          Time: 5726-2035 OT Time Calculation (min): 18 min  Charges: OT General Charges $OT Visit: 1 Visit OT Treatments $Self Care/Home Management : 8-22 mins  10/18/2016 Nestor Lewandowsky, OTR/L Pager: [phone]   Mitchell Garcia 10/18/2016, 11:04 AM

## 2016-10-18 NOTE — Progress Notes (Signed)
Progress Note  Patient Name: Mitchell Garcia Date of Encounter: 10/18/2016  Primary Cardiologist: Angelena Form  Subjective   Feels okay. Asking to go home. Some dyspnea.  Inpatient Medications    Scheduled Meds: . aspirin EC  81 mg Oral Daily  . atorvastatin  80 mg Oral QHS  . carvedilol  3.125 mg Oral BID WC  . clopidogrel  75 mg Oral Daily  . gabapentin  300 mg Oral BID  . insulin aspart  0-5 Units Subcutaneous QHS  . insulin aspart  0-9 Units Subcutaneous TID WC  . insulin aspart  4 Units Subcutaneous TID WC  . insulin glargine  35 Units Subcutaneous QHS  . isosorbide-hydrALAZINE  1 tablet Oral BID  . Living Better with Heart Failure Book   Does not apply Once  . primidone  100 mg Oral Daily  . sodium chloride flush  3 mL Intravenous Q12H  . sodium chloride flush  3 mL Intravenous Q12H   Continuous Infusions: . sodium chloride    . sodium chloride     PRN Meds: sodium chloride, sodium chloride, acetaminophen, morphine injection, nitroGLYCERIN, ondansetron (ZOFRAN) IV, sodium chloride flush, sodium chloride flush   Vital Signs    Vitals:   10/17/16 2022 10/18/16 0000 10/18/16 0417 10/18/16 0847  BP: 133/63  140/67 140/71  Pulse: (!) 57  60 (!) 57  Resp: (!) 25 20 16    Temp: 98.7 F (37.1 C)  97.7 F (36.5 C)   TempSrc: Oral  Oral   SpO2: 99%  93%   Weight:   246 lb 0.5 oz (111.6 kg)   Height:        Intake/Output Summary (Last 24 hours) at 10/18/16 1002 Last data filed at 10/18/16 0800  Gross per 24 hour  Intake              723 ml  Output             1750 ml  Net            -1027 ml   Filed Weights   10/16/16 0345 10/17/16 0554 10/18/16 0417  Weight: 247 lb 12.8 oz (112.4 kg) 246 lb 9.6 oz (111.9 kg) 246 lb 0.5 oz (111.6 kg)    Telemetry    NSR/sinus bradycardia with bundle branch block. - Personally Reviewed  ECG    No new tracing - Personally Reviewed  Physical Exam  Elderly and chronically ill appearing General: Well developed, well  nourished, male appearing in no acute distress. Head: Normocephalic, atraumatic.  Neck: Supple without bruits, JVD absent while sitting. Lungs:  Rales are heard at the bases Heart: RRR, S1, S2, no S3, S4, or murmur; no rub. Abdomen: Soft, non-tender, non-distended with normoactive bowel sounds. No hepatomegaly. No rebound/guarding. No obvious abdominal masses. Extremities: No clubbing, cyanosis, bilateral trace to 1+ ankle edema.  Neuro: Alert and oriented X 3. Moves all extremities spontaneously. Psych: Normal affect.  Labs    Chemistry Recent Labs Lab 10/16/16 0210 10/17/16 1006 10/18/16 0304  NA 133* 133* 134*  K 4.4 4.5 4.7  CL 99* 102 101  CO2 24 22 25   GLUCOSE 150* 166* 187*  BUN 81* 78* 80*  CREATININE 3.51* 3.11* 3.07*  CALCIUM 8.3* 8.4* 8.4*  ALBUMIN  --  2.5*  --   GFRNONAA 16* 18* 19*  GFRAA 18* 21* 22*  ANIONGAP 10 9 8      Hematology Recent Labs Lab 10/16/16 0210 10/17/16 0702 10/18/16 0304  WBC 9.8 10.2  9.2  RBC 3.21* 3.36* 3.15*  HGB 9.1* 9.8* 9.0*  HCT 29.5* 31.1* 29.0*  MCV 91.9 92.6 92.1  MCH 28.3 29.2 28.6  MCHC 30.8 31.5 31.0  RDW 14.2 14.3 14.3  PLT 327 370 328    Cardiac EnzymesNo results for input(s): TROPONINI in the last 168 hours. No results for input(s): TROPIPOC in the last 168 hours.   BNPNo results for input(s): BNP, PROBNP in the last 168 hours.   DDimer No results for input(s): DDIMER in the last 168 hours.    Radiology    No results found.  Cardiac Studies   Cardiac catheterization 10/08/2016: Conclusions: 1. Multivessel coronary artery disease, including 50% proximal LAD stenosis, 70% proximal D1 lesion, chronic total occlusion of mid LCx, and sequential 80% ostial and 95% distal RCA stenoses. 2. Moderate to severely elevated left ventricular filling pressure.  Recommendations: 1. Cardiac surgery consultation for CABG, given complex multivessel coronary artery disease, diabetes mellitus, and reduced LVEF. If the  patient is deemed a poor surgical candidate, consider PCI to RCA (this would likely require atherectomy of the ostial lesion). 2. Restart heparin infusion 4 hours following TR band deflation.  3. Continue diuresis and optimization of heart failure therapy.   Cardiac catheterization and PCI 10/13/2016: 1. Severe, calcified stenosis in the ostium of the RCA.  2. Successful orbital atherectomy of the ostial RCA with placement of a Synergy drug eluting stent 3. Severe distal RCA stenosis.  4. Successful PTCA/DES x 1 distal RCA (Synergy DES).   Echocardiogram 10/04/2016: Study Conclusions  - Left ventricle: Poor acoustic windows limit studly Defiinity used. LVEF is approximately 35% with diffuse hypokinesis. The cavity size was mildly dilated. Wall thickness was increased in a pattern of mild LVH. Doppler parameters are consistent with abnormal left ventricular relaxation (grade 1 diastolic dysfunction). Doppler parameters are consistent with high ventricular filling pressure.  Patient Profile     71 y.o. male with a history of type 2 diabetes mellitus, hypertension, stroke, CKD stage 3-4, hypertension, and chronic diastolic heart failure, presenting with NSTEMI and status post cardiac catheterization with PCI as detailed above. He has acute on chronic renal failure and is been followed by nephrology at this point.  Assessment & Plan    1. NSTEMI: peak troponin 30. Patient found to have multivessel CAD but felt to be poor candidate for CABG per TCTS consultation. S/p PCI with orbital atherectomy and DES to the ostial RCA as well as DES to the distal RCA on 9/19 with residual disease to be managed medically including CTO of the mid circumflex. On DAPT with ASA/plavix.   2. Acute on chronic renal failure, CKD stage 3-4: Creatinine has come down to 3.1 and he has good urine output.needs follow-up blood work in 48-72 hours.  3. Acute on chronic combined heart failure, LVEF  35%:resume diuretic therapy today at 40 mg for 2 days then if kidney function stable,return to typical 80 mg daily dose.We will need [facility] follow-up as well as basic metabolic panel in 24-26 hours.  4. Type 2 diabetes mellitus: hemoglobin A1c 7.6%. He continues on insulin.  5. Essential hypertension: stable with current therapy\  6. HL: on high dose statin   Signed, [name], NP  10/18/2016, 10:02 AM  Pager # [phone]      The patient has been seen in conjunction with [name] NP-C. All aspects of care have been considered and discussed. The patient has been personally interviewed, examined, and all clinical data has  been reviewed.   Resume diuretic therapy as outlined above.  Plan discharge today but will need to have blood work within next 72 hours.   For questions or updates, please contact [place] Please consult [email or web address] for contact info under Cardiology/STEMI. Daytime calls, contact the Day Call APP (6a-8a) or assigned team (Teams A-D) provider (7:30a - 5p). All other daytime calls (7:30-5p), contact the Card Master @ [phone].   Nighttime calls, contact the assigned APP (5p-8p) or MD (6:30p-8p). Overnight calls (8p-6a), contact the on call Fellow @ [phone].

## 2016-10-21 ENCOUNTER — Other Ambulatory Visit: Payer: Medicare Other

## 2016-10-22 ENCOUNTER — Other Ambulatory Visit: Payer: Medicare Other | Admitting: *Deleted

## 2016-10-22 DIAGNOSIS — Z79899 Other long term (current) drug therapy: Secondary | ICD-10-CM

## 2016-10-22 LAB — BASIC METABOLIC PANEL
BUN / CREAT RATIO: 25 — AB (ref 10–24)
BUN: 66 mg/dL — ABNORMAL HIGH (ref 8–27)
CHLORIDE: 105 mmol/L (ref 96–106)
CO2: 20 mmol/L (ref 20–29)
Calcium: 8.8 mg/dL (ref 8.6–10.2)
Creatinine, Ser: 2.61 mg/dL — ABNORMAL HIGH (ref 0.76–1.27)
GFR calc Af Amer: 27 mL/min/{1.73_m2} — ABNORMAL LOW (ref >59)
GFR calc non Af Amer: 23 mL/min/{1.73_m2} — ABNORMAL LOW (ref >59)
GLUCOSE: 234 mg/dL — AB (ref 65–99)
POTASSIUM: 5.5 mmol/L — AB (ref 3.5–5.2)
Sodium: 141 mmol/L (ref 134–144)

## 2016-10-23 ENCOUNTER — Telehealth: Payer: Self-pay | Admitting: Physician Assistant

## 2016-10-23 ENCOUNTER — Encounter: Payer: Self-pay | Admitting: Physician Assistant

## 2016-10-23 NOTE — Progress Notes (Addendum)
Cardiology Office Note    Date:  10/25/2016  ID:  Mitchell Garcia, DOB Jul 05, 1945, MRN [id number] PCP:  Clinic, [facility]  Cardiologist:  Dr. Angelena Form   Chief Complaint: f/u CHF, NSTEMI  History of Present Illness:  Mitchell Garcia is a 71 y.o. male with history of DM, HTN, stroke, HTN, prior chronic diastolic CHF (more recently combined), CKD IV, chronic anemia, CAD/NSTEMI who presents for post-hospital follow-up. He is historically followed by the [place]. He was recently admitted to [facility] 09/2016 with NSTEMI with troponin of 30, CT surgery consulted but felt not to be a candidate for CABG. On 9/19, went back to cath lab for PCI s/p orbital atherectomy with DES to ostial RCA, and PTCA/DES to distal RCA. Residual 50% prox LAD, 70% D1, CTO mLCx noted by initial diagnostic cath 10/08/16 to be treated medically. He also had acute on chronic combined CHF with EF 35% with grade 1 DD. (previously normal). Post-cath course complicated by AKI on CKD stage IV-V, followed by nephrology. He also had anemia which was suspected due to CKD. Discharge Cr was 3.07, LDL 74, Hgb 9.0. F/u BMET 9/28 showed Cr 2.61, potassium 5.5 -> was instructed over the weekend to go to the ER for recheck but he declined.   He returns for follow-up today with his wife. Over the weekend (2 days ago), he called the nephrology office due to increased SOB, abdominal swelling and LEE. He was advised to go back to Lasix 80mg  daily (was discharged on 40mg  daily) and was advised to f/u here. He has noticed increased UOP and improvement in symptoms since that time. He states it's been a rough road since discharge from the hospital but today he feels the best he's felt since discharge. He is not weighing himself every day. His wife has changed the way they eat completely (for the better. He drinks about a liter of fluid daily. He does not wish to participate in cardiac rehab due to the distance from his home. He plans on getting his activity by  walking to the mailbox. No issues with cath site. Has f/u with Dr. Joelyn Oms (nephrology) 10/24. He expresses concern over having to come to appointments outside the [place] system, but he and his wife also feel it is very difficult to get an expeditious appointment there.    Past Medical History:  Diagnosis Date  . Acute kidney injury superimposed on chronic kidney disease ([place]) 10/15/2016  . Altered mental state 01/25/2013  . Anemia of chronic disease 01/25/2013  . CAD S/P percutaneous coronary angioplasty    a.not to be a candidate for CABG. On 9/19, went back to cath lab for PCI s/p orbital atherectomy with DES to ostial RCA, and PTCA/DES to distal RCA. Residual 50% prox LAD, 70% D1, CTO mLCx noted by initial diagnostic cath 10/08/16 to be treated medically.  . Cancer ([place])   . Chronic combined systolic and diastolic CHF (congestive heart failure) ([place])   . CKD (chronic kidney disease), stage IV ([place])   . Diabetes mellitus with renal complications (H[facility])    uncontrolled  . Essential hypertension 10/15/2016  . Humerus head fracture 01/27/2013  . Hyperlipidemia   . Hyponatremia 01/25/2013  . Insulin dependent diabetes mellitus ([place])   . Ischemic cardiomyopathy 10/15/2016  . MI (myocardial infarction) ([place])    years ago  . Stroke ([place])   . Transaminitis 01/25/2013  . Uncontrolled diabetes mellitus ([place]) 10/15/2016    Past Surgical History:  Procedure Laterality Date  .  CORONARY STENT INTERVENTION N/A 10/13/2016   Procedure: CORONARY STENT INTERVENTION;  Surgeon: Burnell Blanks, MD;  Location: [facility];  Service: Cardiovascular;  Laterality: N/A;  . LEFT HEART CATH AND CORONARY ANGIOGRAPHY N/A 10/08/2016   Procedure: LEFT HEART CATH AND CORONARY ANGIOGRAPHY;  Surgeon: Nelva Bush, MD;  Location: [facility];  Service: Cardiovascular;  Laterality: N/A;  . SKIN CANCER EXCISION    . TEMPORARY PACEMAKER N/A 10/13/2016   Procedure: Temporary Pacemaker;  Surgeon: Burnell Blanks, MD;  Location: [facility];  Service: Cardiovascular;  Laterality: N/A;    Current Medications: Current Meds  Medication Sig  . ALPRAZolam (XANAX) 0.5 MG tablet Take 1 tablet (0.5 mg total) by mouth at bedtime as needed for anxiety.  [name] aspirin EC 81 MG tablet Take 81 mg by mouth daily.  [name] atorvastatin (LIPITOR) 80 MG tablet Take 80 mg by mouth at bedtime.  . carvedilol (COREG) 3.125 MG tablet Take 1 tablet (3.125 mg total) by mouth 2 (two) times daily with a meal.  . clopidogrel (PLAVIX) 75 MG tablet Take 1 tablet (75 mg total) by mouth daily.  [name] docusate sodium 100 MG CAPS Take 100 mg by mouth 2 (two) times daily.  . furosemide (LASIX) 40 MG tablet Take 1 tablet (40 mg total) by mouth daily. (Patient taking differently: Take 80 mg by mouth daily. )  . gabapentin (NEURONTIN) 300 MG capsule Take 300 mg by mouth 2 (two) times daily.  . insulin aspart protamine - aspart (NOVOLOG 70/30 MIX) (70-30) 100 UNIT/ML FlexPen Inject 95 Units into the skin 2 (two) times daily.  . isosorbide-hydrALAZINE (BIDIL) 20-37.5 MG tablet Take 1 tablet by mouth 2 (two) times daily.  . nitroGLYCERIN (NITROSTAT) 0.4 MG SL tablet Place 1 tablet (0.4 mg total) under the tongue every 5 (five) minutes x 3 doses as needed for chest pain.  . primidone (MYSOLINE) 50 MG tablet Take 100 mg by mouth daily.  . traMADol (ULTRAM) 50 MG tablet Take 2 tablets (100 mg total) by mouth 2 (two) times daily.  . vitamin B-12 (CYANOCOBALAMIN) 500 MCG tablet Take 1,000 mcg by mouth daily.     Allergies:   Lisinopril and Losartan   Social History   Social History  . Marital status: Married    Spouse name: N/A  . Number of children: N/A  . Years of education: N/A   Social History Main Topics  . Smoking status: Never Smoker  . Smokeless tobacco: Never Used  . Alcohol use No  . Drug use: No  . Sexual activity: Not Asked   Other Topics Concern  . None   Social History Narrative  . None     Family  History:  Family History  Problem Relation Age of Onset  . Hyperlipidemia Father      ROS:   Please see the history of present illness.   All other systems are reviewed and otherwise negative.    PHYSICAL EXAM:   VS:  BP (!) 142/68   Pulse 73   Ht 5\' 11"  (1.803 m)   Wt 253 lb 12.8 oz (115.1 kg)   SpO2 95%   BMI 35.40 kg/m   BMI: Body mass index is 35.4 kg/m. GEN: Well nourished, well developed WM, in no acute distress  HEENT: normocephalic, atraumatic Neck: no JVD, carotid bruits, or masses Cardiac: RRR; no murmurs, rubs, or gallops, 1+ pale pitting bilateral LE edema  Respiratory:  clear to auscultation bilaterally, normal work  of breathing GI: soft, rounded, nontender, nondistended, + BS MS: no deformity or atrophy  Skin: warm and dry, no rash. Left radial cath site without hematoma or ecchymosis; good pulse. Right groin cath site without hematoma, ecchymosis, or bruit.  Neuro:  Alert and Oriented x 3, Strength and sensation are intact, follows commands Psych: euthymic mood, full affect  Wt Readings from Last 3 Encounters:  10/25/16 253 lb 12.8 oz (115.1 kg)  10/18/16 246 lb 0.5 oz (111.6 kg)  02/01/13 257 lb 8 oz (116.8 kg)      Studies/Labs Reviewed:   EKG:  EKG was ordered today and personally reviewed by me and demonstrates NSR 73bpm, sinus arrhythmia, 1st degree AVB, occ PVCs  Recent Labs: 10/09/2016: B Natriuretic Peptide 546.7 10/18/2016: Hemoglobin 9.0; Magnesium 2.3; Platelets 328 10/22/2016: BUN 66; Creatinine, Ser 2.61; Potassium 5.5; Sodium 141   Lipid Panel    Component Value Date/Time   CHOL 144 10/05/2016 0218   TRIG 117 10/05/2016 0218   HDL 48 10/05/2016 0218   CHOLHDL 3.0 10/05/2016 0218   VLDL 23 10/05/2016 0218   LDLCALC 73 10/05/2016 0218    Additional studies/ records that were reviewed today include: Summarized above.  ASSESSMENT & PLAN:   1. CAD - stable. Continue aspirin, statin, beta blocker, Plavix. Discussed cardiac rehab but  he declines. Encouraged long-term commitment to regular physical activity with gradual increase as tolerated. 2. Acute on chronic combined CHF - he appears mildly volume overloaded today. Lasix was increased to 80mg  daily just 2 days ago - I favor continuing this higher dose. I do suspect a component of his edema is also related to low albumin as well. Compression may help along with elevation of legs. Check BMET and BNP today. Discussed 2g sodium diet, 2L fluid restriction, daily weights. He anticipates long-term f/u at the [place] but would recommend a f/u visit here in 7-10 days if unable to get in there to make sure his volume is back to baseline. 3. CKD stage IV with recent hyperkalemia - recheck BMET today. Given his dyspnea, will also f/u CBC (given anemia of chronic disease). Discussed avoidance of excess potassium. Not a candidate for ACEI/ARB/spiro due to this. 4. Essential HTN - BP upper limits of normal. He does feel like he's "slowed down" ever since being put on his heart medicine. I am not sure he would tolerate further med titration at this time so would continue to follow on increased diuretic dose for now.  Disposition: F/u with Dr. McAlhany/care team APP in 7-10 days.   Medication Adjustments/Labs and Tests Ordered: Current medicines are reviewed at length with the patient today.  Concerns regarding medicines are outlined above. Medication changes, Labs and Tests ordered today are summarized above and listed in the Patient Instructions accessible in Encounters.   Signed, Charlie Pitter, PA-C  10/25/2016 11:15 AM    [address], [place], [place]  [address] Phone: [phone]; Fax: [phone]

## 2016-10-23 NOTE — Telephone Encounter (Signed)
    I was alerted by Melina Copa PA-C that the patient had a K of 5.5. I called to talk to the patient who was out but his wife was available to talk. She reported that they has spoken this his nephrologist about worsening LE edema and was instructed to take 80mg  lasix instead of 40mg  daily. I told them that this would help bring down his potassium. We discussed coming to the hospital for repeat labs but wife said he would refuse this. We discussed ER precautions. If he develops symptoms he will head to ER. Otherwise they are okay to wait to recheck BMET at appointment with Melina Copa next Monday.    Angelena Form PA-C  [facility]

## 2016-10-25 ENCOUNTER — Telehealth: Payer: Self-pay | Admitting: Physician Assistant

## 2016-10-25 ENCOUNTER — Ambulatory Visit (INDEPENDENT_AMBULATORY_CARE_PROVIDER_SITE_OTHER): Payer: Non-veteran care | Admitting: Physician Assistant

## 2016-10-25 ENCOUNTER — Encounter: Payer: Self-pay | Admitting: Physician Assistant

## 2016-10-25 VITALS — BP 142/68 | HR 73 | Ht 71.0 in | Wt 253.8 lb

## 2016-10-25 DIAGNOSIS — I2510 Atherosclerotic heart disease of native coronary artery without angina pectoris: Secondary | ICD-10-CM | POA: Diagnosis not present

## 2016-10-25 DIAGNOSIS — R0602 Shortness of breath: Secondary | ICD-10-CM

## 2016-10-25 DIAGNOSIS — I5043 Acute on chronic combined systolic (congestive) and diastolic (congestive) heart failure: Secondary | ICD-10-CM

## 2016-10-25 DIAGNOSIS — N184 Chronic kidney disease, stage 4 (severe): Secondary | ICD-10-CM | POA: Diagnosis not present

## 2016-10-25 DIAGNOSIS — I10 Essential (primary) hypertension: Secondary | ICD-10-CM | POA: Diagnosis not present

## 2016-10-25 DIAGNOSIS — Z79899 Other long term (current) drug therapy: Secondary | ICD-10-CM

## 2016-10-25 LAB — CBC
HEMATOCRIT: 27.4 % — AB (ref 37.5–51.0)
HEMOGLOBIN: 8.8 g/dL — AB (ref 13.0–17.7)
MCH: 28.8 pg (ref 26.6–33.0)
MCHC: 32.1 g/dL (ref 31.5–35.7)
MCV: 90 fL (ref 79–97)
Platelets: 407 10*3/uL — ABNORMAL HIGH (ref 150–379)
RBC: 3.06 x10E6/uL — AB (ref 4.14–5.80)
RDW: 14.3 % (ref 12.3–15.4)
WBC: 9.5 10*3/uL (ref 3.4–10.8)

## 2016-10-25 LAB — BASIC METABOLIC PANEL
BUN/Creatinine Ratio: 22 (ref 10–24)
BUN: 58 mg/dL — AB (ref 8–27)
CALCIUM: 9.2 mg/dL (ref 8.6–10.2)
CHLORIDE: 108 mmol/L — AB (ref 96–106)
CO2: 21 mmol/L (ref 20–29)
Creatinine, Ser: 2.59 mg/dL — ABNORMAL HIGH (ref 0.76–1.27)
GFR, EST AFRICAN AMERICAN: 27 mL/min/{1.73_m2} — AB (ref >59)
GFR, EST NON AFRICAN AMERICAN: 24 mL/min/{1.73_m2} — AB (ref >59)
Glucose: 180 mg/dL — ABNORMAL HIGH (ref 65–99)
Potassium: 5.6 mmol/L — ABNORMAL HIGH (ref 3.5–5.2)
Sodium: 141 mmol/L (ref 134–144)

## 2016-10-25 LAB — PRO B NATRIURETIC PEPTIDE: NT-PRO BNP: 10341 pg/mL — AB (ref 0–376)

## 2016-10-25 MED ORDER — FUROSEMIDE 80 MG PO TABS: 80.0000 mg | ORAL_TABLET | Freq: Every day | ORAL | 3 refills | Status: DC

## 2016-10-25 MED ORDER — SODIUM POLYSTYRENE SULFONATE 15 GM/60ML PO SUSP: 30.0000 g | Freq: Once | ORAL | 0 refills | Status: AC

## 2016-10-25 NOTE — Patient Instructions (Addendum)
Medication Instructions:  Your physician has recommended you make the following change in your medication:  1.  INCREASE the Lasix to 80 mg daily Labwork: TODAY:  BMET, CBC, & PRO BNP  Testing/Procedures: None ordered  Follow-Up: Your physician recommends that you schedule a follow-up appointment in: [place] APP  Any Other Special Instructions Will Be Listed Below (If Applicable).     If you need a refill on your cardiac medications before your next appointment, please call your pharmacy.

## 2016-10-25 NOTE — Telephone Encounter (Signed)
Labs reviewed. Potassium remains elevated at 5.6, Cr stable at 2.59. Pt is not on any medications which could contribute to hyperkalemia and denies any recent supplements or vitamins with excess potassium. Called on-call for nephrology to review (spoke to Dr. Posey Pronto) who recommended we go ahead and treat this with a one-time dose of kayexalate 30g with f/u labwork on Thursday. I called the patient and spoke with both him and his wife regarding these instructions. [facility] was [facility]. I emphasized importance of picking this up today. I told him to return in Thursday during business hours to have labwork drawn. Will forward to nurse to help put in this appointment (he is already aware to come between 7:30-4:30). Also discussed decreasing potassium in diet and reviewed high-potassium foods. Both wife and pt verablized understanding and gratitude.  Dayna Dunn PA-C

## 2016-10-27 ENCOUNTER — Telehealth (HOSPITAL_COMMUNITY): Payer: Self-pay | Admitting: *Deleted

## 2016-10-27 NOTE — Telephone Encounter (Signed)
Received [facility] Referral for pt to attend cardiac rehab s/p NSTEMI and stent placement.  Pt seen during his hospitalization by phase I staff. Pt indicated that he was not interested in participating in cardiac rehab.  Spoke to pt and wife who both stated he did not want to come because it was too far from his home in [place] and he could not afford the gas nor had the time. Reminded pt that he would need to get lab work completed due to elevated Potassium level.  Pt and wife stated he was going tomorrow to the [place] and have it checked.   Pt and wife thanked me for the call. Cherre Huger, BSN Cardiac and Training and development officer

## 2016-10-28 ENCOUNTER — Other Ambulatory Visit: Payer: Non-veteran care

## 2016-10-28 DIAGNOSIS — Z79899 Other long term (current) drug therapy: Secondary | ICD-10-CM | POA: Diagnosis not present

## 2016-10-28 LAB — BASIC METABOLIC PANEL
BUN/Creatinine Ratio: 22 (ref 10–24)
BUN: 55 mg/dL — ABNORMAL HIGH (ref 8–27)
CO2: 20 mmol/L (ref 20–29)
CREATININE: 2.46 mg/dL — AB (ref 0.76–1.27)
Calcium: 8.9 mg/dL (ref 8.6–10.2)
Chloride: 108 mmol/L — ABNORMAL HIGH (ref 96–106)
GFR, EST AFRICAN AMERICAN: 29 mL/min/{1.73_m2} — AB (ref >59)
GFR, EST NON AFRICAN AMERICAN: 25 mL/min/{1.73_m2} — AB (ref >59)
Glucose: 136 mg/dL — ABNORMAL HIGH (ref 65–99)
POTASSIUM: 5 mmol/L (ref 3.5–5.2)
SODIUM: 143 mmol/L (ref 134–144)

## 2016-11-01 ENCOUNTER — Ambulatory Visit (INDEPENDENT_AMBULATORY_CARE_PROVIDER_SITE_OTHER): Payer: Non-veteran care | Admitting: Cardiology

## 2016-11-01 ENCOUNTER — Encounter: Payer: Self-pay | Admitting: Cardiology

## 2016-11-01 VITALS — BP 130/66 | HR 65 | Ht 71.0 in | Wt 256.0 lb

## 2016-11-01 DIAGNOSIS — D649 Anemia, unspecified: Secondary | ICD-10-CM

## 2016-11-01 DIAGNOSIS — I10 Essential (primary) hypertension: Secondary | ICD-10-CM | POA: Diagnosis not present

## 2016-11-01 DIAGNOSIS — N184 Chronic kidney disease, stage 4 (severe): Secondary | ICD-10-CM | POA: Diagnosis not present

## 2016-11-01 DIAGNOSIS — D508 Other iron deficiency anemias: Secondary | ICD-10-CM | POA: Diagnosis not present

## 2016-11-01 DIAGNOSIS — R0602 Shortness of breath: Secondary | ICD-10-CM

## 2016-11-01 DIAGNOSIS — I5023 Acute on chronic systolic (congestive) heart failure: Secondary | ICD-10-CM

## 2016-11-01 DIAGNOSIS — I2510 Atherosclerotic heart disease of native coronary artery without angina pectoris: Secondary | ICD-10-CM | POA: Diagnosis not present

## 2016-11-01 MED ORDER — CLOPIDOGREL BISULFATE 75 MG PO TABS: 75.0000 mg | ORAL_TABLET | Freq: Every day | ORAL | 2 refills | Status: DC

## 2016-11-01 MED ORDER — FUROSEMIDE 80 MG PO TABS: 80.0000 mg | ORAL_TABLET | Freq: Every day | ORAL | 3 refills | Status: DC

## 2016-11-01 MED ORDER — FUROSEMIDE 80 MG PO TABS: 80.0000 mg | ORAL_TABLET | Freq: Two times a day (BID) | ORAL | 2 refills | Status: DC

## 2016-11-01 MED ORDER — CARVEDILOL 3.125 MG PO TABS: 3.1250 mg | ORAL_TABLET | Freq: Two times a day (BID) | ORAL | 2 refills | Status: DC

## 2016-11-01 MED ORDER — FUROSEMIDE 80 MG PO TABS: 80.0000 mg | ORAL_TABLET | Freq: Two times a day (BID) | ORAL | 3 refills | Status: DC

## 2016-11-01 MED ORDER — ISOSORB DINITRATE-HYDRALAZINE 20-37.5 MG PO TABS: 1.0000 | ORAL_TABLET | Freq: Two times a day (BID) | ORAL | 0 refills | Status: DC

## 2016-11-01 NOTE — Patient Instructions (Addendum)
Medication Instructions:   START TAKING LASIX  80 MG  TWICE A DAY FOR THREE DAYS   THEN GO TO TAKING  LASIX 80 MG IN THE AM AND 40 MG IN THE  PM   If you need a refill on your cardiac medications before your next appointment, please call your pharmacy.  Labwork:  BMET  AND CBC  TODAY    Testing/Procedures: NONE ORDERED  TODAY    Follow-Up: CONTACT OFFICE BACK IF  HAVE REOCCURRING SYMPTOMS..   Any Other Special Instructions Will Be Listed Below (If Applicable).

## 2016-11-01 NOTE — Progress Notes (Signed)
Cardiology Office Note   Date:  11/01/2016   ID:  Mitchell Garcia, DOB 20-Aug-1945, MRN [id number]  PCP:  [name], MD  Cardiologist:  Dr. Angelena Form    Chief Complaint  Patient presents with  . Congestive Heart Failure      History of Present Illness: Mitchell Garcia is a 71 y.o. male who presents for CAD and HF.    He has a history of DM, HTN, stroke, HTN, prior chronic diastolic CHF (more recently combined), CKD IV, chronic anemia, CAD/NSTEMI who presents for post-hospital follow-up. He is historically followed by the [place]. He was recently admitted to [facility] 09/2016 with NSTEMI with troponin of 30, CT surgery consulted but felt not to be a candidate for CABG. On 9/19, went back to cath lab for PCI s/p orbital atherectomy with DES to ostial RCA, and PTCA/DES to distal RCA. Residual 50% prox LAD, 70% D1, CTO mLCx noted by initial diagnostic cath 10/08/16 to be treated medically. He also had acute on chronic combined CHF with EF 35% with grade 1 DD. (previously normal). Post-cath course complicated by AKI on CKD stage IV-V, followed by nephrology. He also had anemia which was suspected due to CKD. Discharge Cr was 3.07, LDL 74, Hgb 9.0. F/u BMET 9/28 showed Cr 2.61, potassium 5.5 -> was instructed over the weekend to go to the ER for recheck but he declined.   On last visit he had increasing SOB, SOB and LEE and lasix was increased to 80 mg daily.  He felt better with higher dose.    He plans to go back to [place], but is to be seen here today if cannot get in .  Not a candidate for ACEI/ARB/spiro due to CKD IV.    Today his most recent Cr on 10/28/16 is 2.46 K+ 5.0   He is on lasix 80 mg daily.  He is monitoring his salt which is difficult and decreasing K+ rich foods.   He continues with DOE, and he has to sleep in his living room recliner to keep his head and feet up.  His wife is concerned about his color today as well.  No chest pain.      Past Medical History:  Diagnosis Date  . Acute  kidney injury superimposed on chronic kidney disease ([place]) 10/15/2016  . Altered mental state 01/25/2013  . Anemia of chronic disease 01/25/2013  . CAD S/P percutaneous coronary angioplasty    a.not to be a candidate for CABG. On 9/19, went back to cath lab for PCI s/p orbital atherectomy with DES to ostial RCA, and PTCA/DES to distal RCA. Residual 50% prox LAD, 70% D1, CTO mLCx noted by initial diagnostic cath 10/08/16 to be treated medically.  . Cancer ([place])   . Chronic combined systolic and diastolic CHF (congestive heart failure) ([place])   . CKD (chronic kidney disease), stage IV ([place])   . Diabetes mellitus with renal complications (HCC)    uncontrolled  . Essential hypertension 10/15/2016  . Humerus head fracture 01/27/2013  . Hyperlipidemia   . Hyponatremia 01/25/2013  . Insulin dependent diabetes mellitus ([place])   . Ischemic cardiomyopathy 10/15/2016  . MI (myocardial infarction) ([place])    years ago  . Stroke ([place])   . Transaminitis 01/25/2013  . Uncontrolled diabetes mellitus ([place]) 10/15/2016    Past Surgical History:  Procedure Laterality Date  . CORONARY STENT INTERVENTION N/A 10/13/2016   Procedure: CORONARY STENT INTERVENTION;  Surgeon: Burnell Blanks, MD;  Location: [place]  CV [facility];  Service: Cardiovascular;  Laterality: N/A;  . LEFT HEART CATH AND CORONARY ANGIOGRAPHY N/A 10/08/2016   Procedure: LEFT HEART CATH AND CORONARY ANGIOGRAPHY;  Surgeon: Nelva Bush, MD;  Location: [place] [facility];  Service: Cardiovascular;  Laterality: N/A;  . SKIN CANCER EXCISION    . TEMPORARY PACEMAKER N/A 10/13/2016   Procedure: Temporary Pacemaker;  Surgeon: Burnell Blanks, MD;  Location: [place] [facility];  Service: Cardiovascular;  Laterality: N/A;     Current Outpatient Prescriptions  Medication Sig Dispense Refill  . ALPRAZolam (XANAX) 0.5 MG tablet Take 1 tablet (0.5 mg total) by mouth at bedtime as needed for anxiety. 30 tablet 0  . aspirin EC 81 MG tablet Take 81 mg by  mouth daily.    [name] atorvastatin (LIPITOR) 80 MG tablet Take 80 mg by mouth at bedtime.    . carvedilol (COREG) 3.125 MG tablet Take 1 tablet (3.125 mg total) by mouth 2 (two) times daily with a meal. 180 tablet 0  . clopidogrel (PLAVIX) 75 MG tablet Take 1 tablet (75 mg total) by mouth daily. 90 tablet 2  . docusate sodium 100 MG CAPS Take 100 mg by mouth 2 (two) times daily. 10 capsule 0  . furosemide (LASIX) 80 MG tablet Take 1 tablet (80 mg total) by mouth daily. 90 tablet 3  . gabapentin (NEURONTIN) 300 MG capsule Take 300 mg by mouth 2 (two) times daily.    . insulin aspart protamine - aspart (NOVOLOG 70/30 MIX) (70-30) 100 UNIT/ML FlexPen Inject 95 Units into the skin 2 (two) times daily.    . isosorbide-hydrALAZINE (BIDIL) 20-37.5 MG tablet Take 1 tablet by mouth 2 (two) times daily. 180 tablet 0  . nitroGLYCERIN (NITROSTAT) 0.4 MG SL tablet Place 1 tablet (0.4 mg total) under the tongue every 5 (five) minutes x 3 doses as needed for chest pain. 25 tablet 2  . primidone (MYSOLINE) 50 MG tablet Take 100 mg by mouth daily.    . traMADol (ULTRAM) 50 MG tablet Take 2 tablets (100 mg total) by mouth 2 (two) times daily. 30 tablet 1  . vitamin B-12 (CYANOCOBALAMIN) 500 MCG tablet Take 1,000 mcg by mouth daily.     No current facility-administered medications for this visit.     Allergies:   Lisinopril and Losartan    Social History:  The patient  reports that he has never smoked. He has never used smokeless tobacco. He reports that he does not drink alcohol or use drugs.   Family History:  The patient's family history includes Hyperlipidemia in his father.    ROS:  General:no colds or fevers, + weight increase Skin:no rashes or ulcers HEENT:no blurred vision, no congestion CV:see HPI PUL:see HPI GI:no diarrhea constipation or melena, no indigestion GU:no hematuria, no dysuria MS:no joint pain, no claudication Neuro:no syncope, no lightheadedness Endo:+ diabetes has been running  high, no thyroid disease  Wt Readings from Last 3 Encounters:  11/01/16 256 lb (116.1 kg)  10/25/16 253 lb 12.8 oz (115.1 kg)  10/18/16 246 lb 0.5 oz (111.6 kg)     PHYSICAL EXAM: VS:  BP 130/66   Pulse 65   Ht 5\' 11"  (1.803 m)   Wt 256 lb (116.1 kg)   SpO2 97%   BMI 35.70 kg/m  , BMI Body mass index is 35.7 kg/m. General:Pleasant affect, NAD Skin:Warm and dry, brisk capillary refill HEENT:normocephalic, sclera clear, mucus membranes moist, Hard of hearing Neck:supple, no JVD sitting upright, no bruits  Heart:S1S2  RRR without murmur, gallup, rub or click Lungs:diminished throughout  With scattered rales, no rhonchi, or wheezes PIR:JJOA, non tender, + BS, do not palpate liver spleen or masses Ext:2-3+ lower ext edema to above knees , 2+ radial pulses Neuro:alert and oriented X 3, MAE, follows commands, + facial symmetry    EKG:  EKG is Not ordered today.   Recent Labs: 10/09/2016: B Natriuretic Peptide 546.7 10/18/2016: Magnesium 2.3 10/25/2016: Hemoglobin 8.8; NT-Pro BNP 10,341; Platelets 407 10/28/2016: BUN 55; Creatinine, Ser 2.46; Potassium 5.0; Sodium 143    Lipid Panel    Component Value Date/Time   CHOL 144 10/05/2016 0218   TRIG 117 10/05/2016 0218   HDL 48 10/05/2016 0218   CHOLHDL 3.0 10/05/2016 0218   VLDL 23 10/05/2016 0218   LDLCALC 73 10/05/2016 0218       Other studies Reviewed: Additional studies/ records that were reviewed today include: . Echo 10/04/16 Study Conclusions  - Left ventricle: Poor acoustic windows limit studly Defiinity   used. LVEF is approximately 35% with diffuse hypokinesis. The   cavity size was mildly dilated. Wall thickness was increased in a   pattern of mild LVH. Doppler parameters are consistent with   abnormal left ventricular relaxation (grade 1 diastolic   dysfunction). Doppler parameters are consistent with high   ventricular filling pressure.  10/08/16 LEFT HEART CATH AND CORONARY ANGIOGRAPHY  Conclusion    Conclusions: 1. Multivessel coronary artery disease, including 50% proximal LAD stenosis, 70% proximal D1 lesion, chronic total occlusion of mid LCx, and sequential 80% ostial and 95% distal RCA stenoses. 2. Moderate to severely elevated left ventricular filling pressure.  Recommendations: 1. Cardiac surgery consultation for CABG, given complex multivessel coronary artery disease, diabetes mellitus, and reduced LVEF. If the patient is deemed a poor surgical candidate, consider PCI to RCA (this would likely require atherectomy of the ostial lesion). 2. Restart heparin infusion 4 hours following TR band deflation.  3. Continue diuresis and optimization of heart failure therapy.     10/13/16 Procedures   CORONARY STENT INTERVENTION  Temporary Pacemaker  Conclusion   1. Severe, calcified stenosis in the ostium of the RCA.  2. Successful orbital atherectomy of the ostial RCA with placement of a Synergy drug eluting stent 3. Severe distal RCA stenosis.  4. Successful PTCA/DES x 1 distal RCA (Synergy DES).   Recommendations: Continue DAPT with ASA and Plavix for at least one year.   Indications   Non-ST elevation (NSTEMI) myocardial infarction ([place]) [I21.4 (ICD-10-CM)]    ASSESSMENT AND PLAN:  1.  Acute on chronic systolic and diastolic HF.has been getting up at night with SOB and sitting in lounge chair that keeps head and legs up.  Still with edema and rales,  Discussed with Dr. Rayann Heman increased lasix to 80 in AM and 80 in early pm for 3 days then 80 in AM and 40 in early pm.  Will check BMP today.  2.  CAD recent NSTEMI and stents on plavix.  He was seen by CV surgery but not felt candidate for CABG,  Will continue ASA and Plavix for at least 1 year.  3.  CKD  IV to see nephrology could not get in any earlier.   4.  HTN controlled today   5.  Hyperkalemia now on low K+ food.  Will check labs today  6.  Anemia due to chronic disease. - color is pale, will check CBC  Pt has  no more appts to [place] through the [place] he  is making arrangements to see them.  He and wife will call if any problems in the meantime.     Current medicines are reviewed with the patient today.  The patient Has no concerns regarding medicines.  The following changes have been made:  See above Labs/ tests ordered today include:see above  Disposition:   FU:  see above  Signed, Cecilie Kicks, NP  11/01/2016 2:41 PM    [address], [place], [place], [place] Phone: [phone]; Fax: [phone]

## 2016-11-02 LAB — BASIC METABOLIC PANEL
BUN/Creatinine Ratio: 25 — ABNORMAL HIGH (ref 10–24)
BUN: 66 mg/dL — AB (ref 8–27)
CO2: 20 mmol/L (ref 20–29)
CREATININE: 2.69 mg/dL — AB (ref 0.76–1.27)
Calcium: 8.5 mg/dL — ABNORMAL LOW (ref 8.6–10.2)
Chloride: 108 mmol/L — ABNORMAL HIGH (ref 96–106)
GFR calc Af Amer: 26 mL/min/{1.73_m2} — ABNORMAL LOW (ref >59)
GFR, EST NON AFRICAN AMERICAN: 22 mL/min/{1.73_m2} — AB (ref >59)
GLUCOSE: 145 mg/dL — AB (ref 65–99)
Potassium: 4.9 mmol/L (ref 3.5–5.2)
Sodium: 144 mmol/L (ref 134–144)

## 2016-11-02 LAB — CBC
HEMATOCRIT: 26.6 % — AB (ref 37.5–51.0)
Hemoglobin: 8.7 g/dL — ABNORMAL LOW (ref 13.0–17.7)
MCH: 30.2 pg (ref 26.6–33.0)
MCHC: 32.7 g/dL (ref 31.5–35.7)
MCV: 92 fL (ref 79–97)
Platelets: 328 10*3/uL (ref 150–379)
RBC: 2.88 x10E6/uL — AB (ref 4.14–5.80)
RDW: 15.3 % (ref 12.3–15.4)
WBC: 8.1 10*3/uL (ref 3.4–10.8)

## 2016-11-08 ENCOUNTER — Telehealth: Payer: Self-pay | Admitting: Cardiology

## 2016-11-08 MED ORDER — CARVEDILOL 3.125 MG PO TABS: 3.1250 mg | ORAL_TABLET | Freq: Two times a day (BID) | ORAL | 3 refills | Status: DC

## 2016-11-08 MED ORDER — ISOSORB DINITRATE-HYDRALAZINE 20-37.5 MG PO TABS: 1.0000 | ORAL_TABLET | Freq: Two times a day (BID) | ORAL | 3 refills | Status: DC

## 2016-11-08 MED ORDER — FUROSEMIDE 80 MG PO TABS: ORAL_TABLET | ORAL | 3 refills | Status: DC

## 2016-11-08 MED ORDER — CLOPIDOGREL BISULFATE 75 MG PO TABS: 75.0000 mg | ORAL_TABLET | Freq: Every day | ORAL | 3 refills | Status: DC

## 2016-11-08 NOTE — Telephone Encounter (Signed)
Pt's medication was sent to pt's pharmacy. Confirmation received °

## 2016-11-08 NOTE — Telephone Encounter (Signed)
°*  STAT* If patient is at the pharmacy, call can be transferred to refill team.   1. Which medications need to be refilled? (please list name of each medication and dose if known) plavix 75mg , vidil 20-37.5mg , carvedilol 3.125mg  lasix 80mg  2. Which pharmacy/location (including street and city if local pharmacy) is medication to be sent to? [facility]  3. Do they need a 30 day or 90 day supply? [place]

## 2017-05-17 ENCOUNTER — Emergency Department (HOSPITAL_COMMUNITY): Payer: Non-veteran care

## 2017-05-17 ENCOUNTER — Encounter (HOSPITAL_COMMUNITY): Payer: Self-pay

## 2017-05-17 ENCOUNTER — Inpatient Hospital Stay (HOSPITAL_COMMUNITY)
Admission: EM | Admit: 2017-05-17 | Discharge: 2017-05-24 | DRG: 280 | Disposition: A | Discharge status: Home / Self Care | Payer: Non-veteran care | Attending: Family Medicine | Admitting: Family Medicine

## 2017-05-17 ENCOUNTER — Other Ambulatory Visit: Payer: Self-pay

## 2017-05-17 DIAGNOSIS — I132 Hypertensive heart and chronic kidney disease with heart failure and with stage 5 chronic kidney disease, or end stage renal disease: Principal | ICD-10-CM | POA: Diagnosis present

## 2017-05-17 DIAGNOSIS — D62 Acute posthemorrhagic anemia: Secondary | ICD-10-CM | POA: Diagnosis not present

## 2017-05-17 DIAGNOSIS — N2581 Secondary hyperparathyroidism of renal origin: Secondary | ICD-10-CM | POA: Diagnosis not present

## 2017-05-17 DIAGNOSIS — N186 End stage renal disease: Secondary | ICD-10-CM | POA: Diagnosis present

## 2017-05-17 DIAGNOSIS — E872 Acidosis: Secondary | ICD-10-CM | POA: Diagnosis not present

## 2017-05-17 DIAGNOSIS — R600 Localized edema: Secondary | ICD-10-CM | POA: Diagnosis not present

## 2017-05-17 DIAGNOSIS — Z7902 Long term (current) use of antithrombotics/antiplatelets: Secondary | ICD-10-CM

## 2017-05-17 DIAGNOSIS — E1165 Type 2 diabetes mellitus with hyperglycemia: Secondary | ICD-10-CM | POA: Diagnosis present

## 2017-05-17 DIAGNOSIS — Z9861 Coronary angioplasty status: Secondary | ICD-10-CM

## 2017-05-17 DIAGNOSIS — M7989 Other specified soft tissue disorders: Secondary | ICD-10-CM | POA: Diagnosis not present

## 2017-05-17 DIAGNOSIS — M79609 Pain in unspecified limb: Secondary | ICD-10-CM

## 2017-05-17 DIAGNOSIS — I21A1 Myocardial infarction type 2: Secondary | ICD-10-CM | POA: Diagnosis not present

## 2017-05-17 DIAGNOSIS — Z85828 Personal history of other malignant neoplasm of skin: Secondary | ICD-10-CM | POA: Diagnosis not present

## 2017-05-17 DIAGNOSIS — F419 Anxiety disorder, unspecified: Secondary | ICD-10-CM | POA: Diagnosis present

## 2017-05-17 DIAGNOSIS — Z794 Long term (current) use of insulin: Secondary | ICD-10-CM

## 2017-05-17 DIAGNOSIS — Z7982 Long term (current) use of aspirin: Secondary | ICD-10-CM

## 2017-05-17 DIAGNOSIS — I255 Ischemic cardiomyopathy: Secondary | ICD-10-CM | POA: Diagnosis present

## 2017-05-17 DIAGNOSIS — E1122 Type 2 diabetes mellitus with diabetic chronic kidney disease: Secondary | ICD-10-CM | POA: Diagnosis present

## 2017-05-17 DIAGNOSIS — I214 Non-ST elevation (NSTEMI) myocardial infarction: Secondary | ICD-10-CM | POA: Diagnosis not present

## 2017-05-17 DIAGNOSIS — N179 Acute kidney failure, unspecified: Secondary | ICD-10-CM

## 2017-05-17 DIAGNOSIS — R072 Precordial pain: Secondary | ICD-10-CM | POA: Diagnosis not present

## 2017-05-17 DIAGNOSIS — G8191 Hemiplegia, unspecified affecting right dominant side: Secondary | ICD-10-CM | POA: Diagnosis not present

## 2017-05-17 DIAGNOSIS — Z79899 Other long term (current) drug therapy: Secondary | ICD-10-CM

## 2017-05-17 DIAGNOSIS — M79604 Pain in right leg: Secondary | ICD-10-CM | POA: Diagnosis not present

## 2017-05-17 DIAGNOSIS — Z992 Dependence on renal dialysis: Secondary | ICD-10-CM | POA: Diagnosis not present

## 2017-05-17 DIAGNOSIS — D631 Anemia in chronic kidney disease: Secondary | ICD-10-CM | POA: Diagnosis present

## 2017-05-17 DIAGNOSIS — E785 Hyperlipidemia, unspecified: Secondary | ICD-10-CM | POA: Diagnosis present

## 2017-05-17 DIAGNOSIS — E1129 Type 2 diabetes mellitus with other diabetic kidney complication: Secondary | ICD-10-CM | POA: Diagnosis present

## 2017-05-17 DIAGNOSIS — R748 Abnormal levels of other serum enzymes: Secondary | ICD-10-CM

## 2017-05-17 DIAGNOSIS — I5043 Acute on chronic combined systolic (congestive) and diastolic (congestive) heart failure: Secondary | ICD-10-CM | POA: Diagnosis not present

## 2017-05-17 DIAGNOSIS — N184 Chronic kidney disease, stage 4 (severe): Secondary | ICD-10-CM | POA: Diagnosis not present

## 2017-05-17 DIAGNOSIS — I252 Old myocardial infarction: Secondary | ICD-10-CM

## 2017-05-17 DIAGNOSIS — N189 Chronic kidney disease, unspecified: Secondary | ICD-10-CM

## 2017-05-17 DIAGNOSIS — I130 Hypertensive heart and chronic kidney disease with heart failure and stage 1 through stage 4 chronic kidney disease, or unspecified chronic kidney disease: Secondary | ICD-10-CM | POA: Diagnosis not present

## 2017-05-17 DIAGNOSIS — Z955 Presence of coronary angioplasty implant and graft: Secondary | ICD-10-CM | POA: Diagnosis not present

## 2017-05-17 DIAGNOSIS — E8770 Fluid overload, unspecified: Secondary | ICD-10-CM | POA: Diagnosis not present

## 2017-05-17 DIAGNOSIS — N171 Acute kidney failure with acute cortical necrosis: Secondary | ICD-10-CM | POA: Diagnosis not present

## 2017-05-17 DIAGNOSIS — K649 Unspecified hemorrhoids: Secondary | ICD-10-CM | POA: Diagnosis present

## 2017-05-17 DIAGNOSIS — I2510 Atherosclerotic heart disease of native coronary artery without angina pectoris: Secondary | ICD-10-CM | POA: Diagnosis not present

## 2017-05-17 DIAGNOSIS — I953 Hypotension of hemodialysis: Secondary | ICD-10-CM | POA: Diagnosis not present

## 2017-05-17 LAB — BASIC METABOLIC PANEL
Anion gap: 19 — ABNORMAL HIGH (ref 5–15)
BUN: 127 mg/dL — ABNORMAL HIGH (ref 6–20)
CALCIUM: 8.1 mg/dL — AB (ref 8.9–10.3)
CO2: 15 mmol/L — AB (ref 22–32)
CREATININE: 4.48 mg/dL — AB (ref 0.61–1.24)
Chloride: 104 mmol/L (ref 101–111)
GFR, EST AFRICAN AMERICAN: 14 mL/min — AB (ref >60)
GFR, EST NON AFRICAN AMERICAN: 12 mL/min — AB (ref >60)
Glucose, Bld: 338 mg/dL — ABNORMAL HIGH (ref 65–99)
Potassium: 4.4 mmol/L (ref 3.5–5.1)
Sodium: 138 mmol/L (ref 135–145)

## 2017-05-17 LAB — URINALYSIS, ROUTINE W REFLEX MICROSCOPIC
Bilirubin Urine: NEGATIVE
Glucose, UA: 50 mg/dL — AB
KETONES UR: NEGATIVE mg/dL
LEUKOCYTES UA: NEGATIVE
Nitrite: NEGATIVE
PROTEIN: 100 mg/dL — AB
Specific Gravity, Urine: 1.01 (ref 1.005–1.030)
pH: 5 (ref 5.0–8.0)

## 2017-05-17 LAB — I-STAT TROPONIN, ED: Troponin i, poc: 1.33 ng/mL (ref 0.00–0.08)

## 2017-05-17 LAB — CBG MONITORING, ED: Glucose-Capillary: 230 mg/dL — ABNORMAL HIGH (ref 65–99)

## 2017-05-17 LAB — CBC
HCT: 25 % — ABNORMAL LOW (ref 39.0–52.0)
Hemoglobin: 7.9 g/dL — ABNORMAL LOW (ref 13.0–17.0)
MCH: 29.7 pg (ref 26.0–34.0)
MCHC: 31.6 g/dL (ref 30.0–36.0)
MCV: 94 fL (ref 78.0–100.0)
PLATELETS: 244 10*3/uL (ref 150–400)
RBC: 2.66 MIL/uL — AB (ref 4.22–5.81)
RDW: 15.1 % (ref 11.5–15.5)
WBC: 9.5 10*3/uL (ref 4.0–10.5)

## 2017-05-17 LAB — BRAIN NATRIURETIC PEPTIDE: B NATRIURETIC PEPTIDE 5: 754.3 pg/mL — AB (ref 0.0–100.0)

## 2017-05-17 LAB — TROPONIN I: Troponin I: 0.97 ng/mL (ref <0.03)

## 2017-05-17 MED ORDER — FERROUS SULFATE 325 (65 FE) MG PO TABS
325.0000 mg | ORAL_TABLET | Freq: Two times a day (BID) | ORAL | Status: DC
  Administered 2017-05-18 – 2017-05-23 (×11): 325 mg via ORAL
  Filled 2017-05-17 (×12): qty 1

## 2017-05-17 MED ORDER — ATORVASTATIN CALCIUM 80 MG PO TABS
80.0000 mg | ORAL_TABLET | Freq: Every day | ORAL | Status: DC
  Administered 2017-05-17 – 2017-05-23 (×7): 80 mg via ORAL
  Filled 2017-05-17 (×7): qty 1

## 2017-05-17 MED ORDER — INSULIN ASPART PROT & ASPART (70-30 MIX) 100 UNIT/ML ~~LOC~~ SUSP: 95.0000 [IU] | Freq: Two times a day (BID) | SUBCUTANEOUS | Status: DC

## 2017-05-17 MED ORDER — HEPARIN BOLUS VIA INFUSION
2000.0000 [IU] | Freq: Once | INTRAVENOUS | Status: AC
  Administered 2017-05-17: 2000 [IU] via INTRAVENOUS
  Filled 2017-05-17: qty 2000

## 2017-05-17 MED ORDER — SODIUM CHLORIDE 0.9% FLUSH
3.0000 mL | Freq: Two times a day (BID) | INTRAVENOUS | Status: DC
  Administered 2017-05-17 – 2017-05-23 (×11): 3 mL via INTRAVENOUS

## 2017-05-17 MED ORDER — ONDANSETRON HCL 4 MG/2ML IJ SOLN: 4.0000 mg | Freq: Four times a day (QID) | INTRAMUSCULAR | Status: DC | PRN

## 2017-05-17 MED ORDER — SODIUM CHLORIDE 0.9% FLUSH: 3.0000 mL | INTRAVENOUS | Status: DC | PRN

## 2017-05-17 MED ORDER — ISOSORB DINITRATE-HYDRALAZINE 20-37.5 MG PO TABS
1.0000 | ORAL_TABLET | Freq: Two times a day (BID) | ORAL | Status: DC
  Administered 2017-05-17 – 2017-05-23 (×13): 1 via ORAL
  Filled 2017-05-17 (×14): qty 1

## 2017-05-17 MED ORDER — CARVEDILOL 3.125 MG PO TABS
3.1250 mg | ORAL_TABLET | Freq: Two times a day (BID) | ORAL | Status: DC
  Administered 2017-05-18 – 2017-05-22 (×10): 3.125 mg via ORAL
  Filled 2017-05-17 (×11): qty 1

## 2017-05-17 MED ORDER — ALPRAZOLAM 0.5 MG PO TABS
0.5000 mg | ORAL_TABLET | Freq: Every evening | ORAL | Status: DC | PRN
  Administered 2017-05-23: 0.5 mg via ORAL
  Filled 2017-05-17: qty 1

## 2017-05-17 MED ORDER — ACETAMINOPHEN 325 MG PO TABS
650.0000 mg | ORAL_TABLET | ORAL | Status: DC | PRN
  Administered 2017-05-18: 650 mg via ORAL
  Filled 2017-05-17: qty 2

## 2017-05-17 MED ORDER — NITROGLYCERIN 0.4 MG SL SUBL: 0.4000 mg | SUBLINGUAL_TABLET | SUBLINGUAL | Status: DC | PRN

## 2017-05-17 MED ORDER — HEPARIN (PORCINE) IN NACL 100-0.45 UNIT/ML-% IJ SOLN
1350.0000 [IU]/h | INTRAMUSCULAR | Status: DC
  Administered 2017-05-17: 1350 [IU]/h via INTRAVENOUS
  Filled 2017-05-17: qty 250

## 2017-05-17 MED ORDER — CLOPIDOGREL BISULFATE 75 MG PO TABS
75.0000 mg | ORAL_TABLET | Freq: Every day | ORAL | Status: DC
  Administered 2017-05-18: 75 mg via ORAL
  Filled 2017-05-17: qty 1

## 2017-05-17 MED ORDER — SODIUM CHLORIDE 0.9 % IV SOLN: 250.0000 mL | INTRAVENOUS | Status: DC | PRN

## 2017-05-17 MED ORDER — DOCUSATE SODIUM 100 MG PO CAPS
100.0000 mg | ORAL_CAPSULE | Freq: Two times a day (BID) | ORAL | Status: DC
  Administered 2017-05-17 – 2017-05-24 (×12): 100 mg via ORAL
  Filled 2017-05-17 (×13): qty 1

## 2017-05-17 MED ORDER — FUROSEMIDE 10 MG/ML IJ SOLN
120.0000 mg | Freq: Once | INTRAMUSCULAR | Status: AC
  Administered 2017-05-17: 120 mg via INTRAVENOUS
  Filled 2017-05-17: qty 10

## 2017-05-17 MED ORDER — GABAPENTIN 300 MG PO CAPS
300.0000 mg | ORAL_CAPSULE | Freq: Every day | ORAL | Status: DC
Start: 2017-05-17 — End: 2017-05-24
  Administered 2017-05-17 – 2017-05-23 (×7): 300 mg via ORAL
  Filled 2017-05-17 (×7): qty 1

## 2017-05-17 NOTE — Progress Notes (Signed)
Preliminary result--Right lower extremity venous study completed. Limited study due to patient has severe pain, pitting edema, intolerance of pain. Negative for where approachable, but not completely exclude the DVT due to patient condition.  Distal femoral vein dilated, compressible with poor flow. No popliteal fossa cyst seen.    Hongying Landry Mellow (RDMS RVT) 05/17/17 7:19 PM

## 2017-05-17 NOTE — ED Notes (Signed)
Vascular tech aware

## 2017-05-17 NOTE — Consult Note (Signed)
Cardiology Consultation Note    [name], MRN: [id number], DOB/AGE: 1945-09-30 72 y.o. Admit date: 05/17/2017   Date of Consult: 05/17/2017 Primary Physician: [name], MD  Reason for Consultation: Elevated troponin Requesting MD: Dr. Laverta Baltimore  HPI: Mitchell Garcia is a 72 y.o. male with a history of coronary artery disease, ischemic cardiomyopathy, CKD, hypertension, hyperlipidemia, diabetes, who presents with worsening fatigue, generalized weakness, shortness of breath, lower extremity edema.  The patient gets most of his medical care through the [place] system, where his renal function has been closely followed recently, as it is deteriorating.  3 weeks ago he had a right upper extremity AV fistula placed in anticipation of dialysis need in the relatively near future.  Over the last week he was instructed to decrease the frequency of his metolazone dosing, which is resulted in significant weight gain and bilateral lower extremity edema.  Otherwise, he also feels significant generalized weakness and has had some near syncopal episodes.  He is dyspneic with exertion.  Yesterday while at the [place], he did have a 20-30-minute episode of chest discomfort which has resolved and has not recurred since.  Given these numerous complaints and lab work from the [place] showing worsening renal function, the patient presented to the emergency department for evaluation.  In the ED, vital signs were notable for mild hypertension but were otherwise within normal limits.  EKG shows normal sinus rhythm with first-degree AV block and right bundle branch block, with associated repolarization abnormalities.  There are no acute changes relative to priors.  Labs were notable for a creatinine of 4.5, initial troponin of 1.33, which then down trended to 0.97.  The patient has been chest pain-free while in the emergency department.  He was started on heparin drip, and given high-dose IV Lasix in an attempt to start  diuresis.    Past Medical History:  Diagnosis Date  . Acute kidney injury superimposed on chronic kidney disease ([place]) 10/15/2016  . Altered mental state 01/25/2013  . Anemia of chronic disease 01/25/2013  . CAD S/P percutaneous coronary angioplasty    a.not to be a candidate for CABG. On 9/19, went back to cath lab for PCI s/p orbital atherectomy with DES to ostial RCA, and PTCA/DES to distal RCA. Residual 50% prox LAD, 70% D1, CTO mLCx noted by initial diagnostic cath 10/08/16 to be treated medically.  . Cancer ([place])   . Chronic combined systolic and diastolic CHF (congestive heart failure) ([place])   . CKD (chronic kidney disease), stage IV ([place])   . Diabetes mellitus with renal complications (HCC)    uncontrolled  . Essential hypertension 10/15/2016  . Humerus head fracture 01/27/2013  . Hyperlipidemia   . Hyponatremia 01/25/2013  . Insulin dependent diabetes mellitus ([place])   . Ischemic cardiomyopathy 10/15/2016  . MI (myocardial infarction) ([place])    years ago  . Stroke ([place])   . Transaminitis 01/25/2013  . Uncontrolled diabetes mellitus ([place]) 10/15/2016      Surgical History:  Past Surgical History:  Procedure Laterality Date  . CORONARY STENT INTERVENTION N/A 10/13/2016   Procedure: CORONARY STENT INTERVENTION;  Surgeon: Burnell Blanks, MD;  Location: [place] [facility];  Service: Cardiovascular;  Laterality: N/A;  . LEFT HEART CATH AND CORONARY ANGIOGRAPHY N/A 10/08/2016   Procedure: LEFT HEART CATH AND CORONARY ANGIOGRAPHY;  Surgeon: Nelva Bush, MD;  Location: [place] [facility];  Service: Cardiovascular;  Laterality: N/A;  . SKIN CANCER EXCISION    .  TEMPORARY PACEMAKER N/A 10/13/2016   Procedure: Temporary Pacemaker;  Surgeon: Burnell Blanks, MD;  Location: [place] [facility];  Service: Cardiovascular;  Laterality: N/A;     Home Meds: Prior to Admission medications   Medication Sig Start Date End Date Taking? Authorizing Provider  ALPRAZolam Duanne Moron) 0.5 MG  tablet Take 1 tablet (0.5 mg total) by mouth at bedtime as needed for anxiety. 02/01/13  Yes Theodis Blaze, MD  aspirin EC 81 MG tablet Take 81 mg by mouth daily.   Yes [provider]  atorvastatin (LIPITOR) 80 MG tablet Take 80 mg by mouth at bedtime.   Yes [provider]  bumetanide (BUMEX) 2 MG tablet Take 2 mg by mouth 2 (two) times daily.   Yes [provider]  carvedilol (COREG) 3.125 MG tablet Take 1 tablet (3.125 mg total) by mouth 2 (two) times daily with a meal. 11/08/16  Yes Isaiah Serge, NP  clopidogrel (PLAVIX) 75 MG tablet Take 1 tablet (75 mg total) by mouth daily. 11/08/16  Yes Isaiah Serge, NP  docusate sodium 100 MG CAPS Take 100 mg by mouth 2 (two) times daily. 02/01/13  Yes Theodis Blaze, MD  ferrous sulfate 325 (65 FE) MG tablet Take 325 mg by mouth 2 (two) times daily.   Yes [provider]  ferumoxytol 510 mg in sodium chloride 0.9 % 100 mL Inject 510 mg into the vein once.   Yes [provider]  gabapentin (NEURONTIN) 300 MG capsule Take 300 mg by mouth 2 (two) times daily.   Yes [provider]  insulin aspart protamine - aspart (NOVOLOG 70/30 MIX) (70-30) 100 UNIT/ML FlexPen Inject 95 Units into the skin 2 (two) times daily.   Yes [provider]  isosorbide-hydrALAZINE (BIDIL) 20-37.5 MG tablet Take 1 tablet by mouth 2 (two) times daily. 11/08/16  Yes Isaiah Serge, NP  metolazone (ZAROXOLYN) 5 MG tablet Take 5 mg by mouth 3 (three) times a week. Mon, Wed, Fri   Yes [provider]  nitroGLYCERIN (NITROSTAT) 0.4 MG SL tablet Place 1 tablet (0.4 mg total) under the tongue every 5 (five) minutes x 3 doses as needed for chest pain. 10/18/16  Yes Cheryln Manly, NP  primidone (MYSOLINE) 50 MG tablet Take 100 mg by mouth daily.   Yes [provider]  vitamin B-12 (CYANOCOBALAMIN) 500 MCG tablet Take 1,000 mcg by mouth daily.   Yes [provider]  furosemide (LASIX) 80 MG tablet  Take 80 MG by mouth TWICE DAY FOR THREE DAYS....THEN 80 MG IN AM 40 MG IN PM - Oral Patient not taking: Reported on 05/17/2017 11/08/16   Isaiah Serge, NP  traMADol (ULTRAM) 50 MG tablet Take 2 tablets (100 mg total) by mouth 2 (two) times daily. Patient not taking: Reported on 05/17/2017 02/01/13   Theodis Blaze, MD    Inpatient Medications:  . atorvastatin  80 mg Oral QHS  . [START ON 05/18/2017] carvedilol  3.125 mg Oral BID WC  . [START ON 05/18/2017] clopidogrel  75 mg Oral Daily  . docusate sodium  100 mg Oral BID  . [START ON 05/18/2017] ferrous sulfate  325 mg Oral BID WC  . gabapentin  300 mg Oral QHS  . insulin aspart protamine - aspart  95 Units Subcutaneous BID  . isosorbide-hydrALAZINE  1 tablet Oral BID  . sodium chloride flush  3 mL Intravenous Q12H   . sodium chloride    . furosemide 120  mg (05/17/17 2227)  . heparin 1,350 Units/hr (05/17/17 2232)    Allergies:  Allergies  Allergen Reactions  . Oxycodone Other (See Comments)    DIFFICULT TO WAKE UP  . Tramadol Nausea And Vomiting  . Lisinopril     Hyperkalemia   . Losartan     Hyperkalemia     Social History   Socioeconomic History  . Marital status: Married    Spouse name: Not on file  . Number of children: Not on file  . Years of education: Not on file  . Highest education level: Not on file  Occupational History  . Not on file  Social Needs  . Financial resource strain: Not on file  . Food insecurity:    Worry: Not on file    Inability: Not on file  . Transportation needs:    Medical: Not on file    Non-medical: Not on file  Tobacco Use  . Smoking status: Never Smoker  . Smokeless tobacco: Never Used  Substance and Sexual Activity  . Alcohol use: No  . Drug use: No  . Sexual activity: Not on file  Lifestyle  . Physical activity:    Days per week: Not on file    Minutes per session: Not on file  . Stress: Not on file  Relationships  . Social connections:    Talks on phone: Not on file     Gets together: Not on file    Attends religious service: Not on file    Active member of club or organization: Not on file    Attends meetings of clubs or organizations: Not on file    Relationship status: Not on file  . Intimate partner violence:    Fear of current or ex partner: Not on file    Emotionally abused: Not on file    Physically abused: Not on file    Forced sexual activity: Not on file  Other Topics Concern  . Not on file  Social History Narrative  . Not on file     Family History  Problem Relation Age of Onset  . Hyperlipidemia Father      Review of Systems: All other systems reviewed and are otherwise negative except as noted above.  Labs: Recent Labs    05/17/17 2052  TROPONINI 0.97*   Lab Results  Component Value Date   WBC 9.5 05/17/2017   HGB 7.9 (L) 05/17/2017   HCT 25.0 (L) 05/17/2017   MCV 94.0 05/17/2017   PLT 244 05/17/2017    Recent Labs  Lab 05/17/17 1550  NA 138  K 4.4  CL 104  CO2 15*  BUN 127*  CREATININE 4.48*  CALCIUM 8.1*  GLUCOSE 338*   Lab Results  Component Value Date   CHOL 144 10/05/2016   HDL 48 10/05/2016   LDLCALC 73 10/05/2016   TRIG 117 10/05/2016   Lab Results  Component Value Date   DDIMER 0.93 (H) 01/25/2013    Radiology/Studies:  Dg Chest 2 View  Result Date: 05/17/2017 CLINICAL DATA:  Chest pain.  Renal failure. EXAM: CHEST - 2 VIEW COMPARISON:  October 04, 2016 FINDINGS: There is no edema or consolidation. Heart is mildly enlarged with pulmonary vascularity within normal limits. No adenopathy. There is aortic atherosclerosis. There is an old healed fracture of the proximal right humerus, nonunited. IMPRESSION: Cardiomegaly. No edema or consolidation. There is aortic atherosclerosis. There is a nonunited old fracture of the right proximal humerus. Aortic Atherosclerosis (ICD10-I70.0). Electronically Signed  By: Lowella Grip III M.D.   On: 05/17/2017 14:42    Wt Readings from Last 3  Encounters:  05/17/17 104.3 kg (230 lb)  11/01/16 116.1 kg (256 lb)  10/25/16 115.1 kg (253 lb 12.8 oz)    EKG: Normal sinus rhythm with first-degree AV block and right bundle branch block, with associated repolarization abnormalities   Physical Exam: Blood pressure (!) 145/90, pulse 84, temperature 98.6 F (37 C), temperature source Oral, resp. rate 17, height 5\' 11"  (1.803 m), weight 104.3 kg (230 lb), SpO2 98 %. Body mass index is 32.08 kg/m. General: Elderly man, chronically ill-appearing Head: Normocephalic, atraumatic, sclera non-icteric, no xanthomas, nares are without discharge.  Neck: Negative for carotid bruits. JVP 13-14. Lungs: Clear bilaterally to auscultation without wheezes, rales, or rhonchi. Breathing is unlabored. Heart: RRR with distant S1 S2. No murmurs, rubs, or gallops appreciated. Abdomen: Soft, non-tender, non-distended with normoactive bowel sounds. No hepatomegaly. No rebound/guarding. No obvious abdominal masses. Msk:  Strength and tone appear normal for age. Extremities: WWP, 2+ lower extremity edema bilaterally.  Distal pedal pulses are 2+ and equal bilaterally. Psych:  Responds to questions appropriately with a normal affect.     Assessment and Plan  72 y.o. male with a history of coronary artery disease, ischemic cardiomyopathy, CKD, hypertension, hyperlipidemia, diabetes, who presents with worsening fatigue, generalized weakness, shortness of breath, and lower extremity edema, found to have acute on chronic kidney injury and elevated troponin.  I suspect that his deteriorating renal function is responsible for his volume overload, in combination with reduction in metolazone frequency.  Would aggressively diurese with Lasix infusion and possibly Diuril if urine output is marginal.  Nephrology has been consulted and will see the patient tomorrow.  It is possible that he will require hemodialysis during this admission.  While his troponin is elevated, it is not  uptrending, and as such is reassuring that this is most likely a type II event with impaired renal clearance of troponin.  Reasonable to medically manage with heparin infusion for now.  Would not plan on cardiac catheterization given worsening renal dysfunction and unlikely ACS.  Plan to repeat an echo in the morning.  Cardiology will follow with you.  Signed, Doylene Canning, MD 05/17/2017, 11:00 PM

## 2017-05-17 NOTE — H&P (Signed)
History and Physical    Mitchell Garcia WNI:[id number] DOB: 09-22-1945 DOA: 05/17/2017  Referring MD/NP/PA:Dr. Vonna Kotyk long PCP: [name], MD  Patient coming from: home  Chief Complaint: abnormal labs  I have personally briefly reviewed patient's old medical records in [place]   HPI: Mitchell Garcia is a 72 y.o. male with medical history significant of CAD s/p stents, combined CHF with EF noted to be around 35%, CKD stage IV, DM type II, CVA with right-sided weakness, and anemia; who presents after being advised to come in by the [place] for abnormal lab work.  Patient cites history but is somewhat of a poor historian.  He normally receives all of his care at the [facility].  Patient complains of having progressively worsening lower extremity swelling over the last week after medications were decreased due to kidney function.  Patient had been still taking his Bumex as advise.  D he complained of having some mild left-sided stabbing chest pain yesterday that resolved on its own.  Patient notes that he is previously been evaluated by his cardiologist and told that the backside of his heart is stopped up, and his health is not good enough to have open heart surgery.  Associated symptoms include complaints of right leg discomfort and shortness of breath with exertion. He had gone to his nephrologist today and had received an infusion of iron.  Blood work was also obtained and his kidney function was noted to be significantly decreased.  His wife had been notified for him to come to the hospital for further evaluation.  Patient reports that he has had a fistula placed 3 weeks ago in the right upper extremity in preparation for hemodialysis.  He had also been recently placed on antibiotics for lower extremity redness, but denies any change in symptoms.  Denies any significant fever, chills, nausea, vomiting, or diarrhea.   ED Course: Admission into the emergency department patient was noted  to be afebrile, pulse 73-80, respirations 1728, blood pressure 106/58-144/78, and O2 saturations 94-100% on RA.  Labs revealed WBC 9.5, hemoglobin 7.9, sodium 138, chloride 104, CO2 15, BUN 127, creatinine 4.48, glucose 338, anion gap 19, troponin I 1.33, and BNP 754.3.  Chest x-ray showed cardiomegaly without edema or consolidation.  Doppler ultrasound of lower extremity showed no signs of DVT although poor quality.  Cardiology was consulted and was started on a heparin drip.  Review of Systems  Constitutional: Positive for malaise/fatigue. Negative for chills and fever.  HENT: Negative for congestion and nosebleeds.   Eyes: Negative for double vision and photophobia.  Respiratory: Positive for shortness of breath.   Cardiovascular: Positive for chest pain and leg swelling.  Gastrointestinal: Negative for abdominal pain, blood in stool, diarrhea, nausea and vomiting.  Genitourinary: Negative for dysuria and hematuria.  Musculoskeletal: Positive for myalgias. Negative for falls.  Skin: Positive for rash. Negative for itching.  Neurological: Negative for focal weakness and loss of consciousness.  Endo/Heme/Allergies: Negative for polydipsia.  Psychiatric/Behavioral: Negative for hallucinations and substance abuse.    Past Medical History:  Diagnosis Date  . Acute kidney injury superimposed on chronic kidney disease ([place]) 10/15/2016  . Altered mental state 01/25/2013  . Anemia of chronic disease 01/25/2013  . CAD S/P percutaneous coronary angioplasty    a.not to be a candidate for CABG. On 9/19, went back to cath lab for PCI s/p orbital atherectomy with DES to ostial RCA, and PTCA/DES to distal RCA. Residual 50% prox LAD, 70% D1, CTO mLCx  noted by initial diagnostic cath 10/08/16 to be treated medically.  . Cancer ([place])   . Chronic combined systolic and diastolic CHF (congestive heart failure) ([place])   . CKD (chronic kidney disease), stage IV ([place])   . Diabetes mellitus with renal complications  (HCC)    uncontrolled  . Essential hypertension 10/15/2016  . Humerus head fracture 01/27/2013  . Hyperlipidemia   . Hyponatremia 01/25/2013  . Insulin dependent diabetes mellitus ([place])   . Ischemic cardiomyopathy 10/15/2016  . MI (myocardial infarction) ([place])    years ago  . Stroke ([place])   . Transaminitis 01/25/2013  . Uncontrolled diabetes mellitus ([place]) 10/15/2016    Past Surgical History:  Procedure Laterality Date  . CORONARY STENT INTERVENTION N/A 10/13/2016   Procedure: CORONARY STENT INTERVENTION;  Surgeon: Burnell Blanks, MD;  Location: [place] [facility];  Service: Cardiovascular;  Laterality: N/A;  . LEFT HEART CATH AND CORONARY ANGIOGRAPHY N/A 10/08/2016   Procedure: LEFT HEART CATH AND CORONARY ANGIOGRAPHY;  Surgeon: Nelva Bush, MD;  Location: [place] [facility];  Service: Cardiovascular;  Laterality: N/A;  . SKIN CANCER EXCISION    . TEMPORARY PACEMAKER N/A 10/13/2016   Procedure: Temporary Pacemaker;  Surgeon: Burnell Blanks, MD;  Location: [place] [facility];  Service: Cardiovascular;  Laterality: N/A;     reports that he has never smoked. He has never used smokeless tobacco. He reports that he does not drink alcohol or use drugs.  Allergies  Allergen Reactions  . Oxycodone Other (See Comments)    DIFFICULT TO WAKE UP  . Tramadol Nausea And Vomiting  . Lisinopril     Hyperkalemia   . Losartan     Hyperkalemia     Family History  Problem Relation Age of Onset  . Hyperlipidemia Father     Prior to Admission medications   Medication Sig Start Date End Date Taking? Authorizing Provider  ALPRAZolam Duanne Moron) 0.5 MG tablet Take 1 tablet (0.5 mg total) by mouth at bedtime as needed for anxiety. 02/01/13  Yes Theodis Blaze, MD  aspirin EC 81 MG tablet Take 81 mg by mouth daily.   Yes [provider]  atorvastatin (LIPITOR) 80 MG tablet Take 80 mg by mouth at bedtime.   Yes [provider]  bumetanide (BUMEX) 2 MG tablet Take 2 mg  by mouth 2 (two) times daily.   Yes [provider]  carvedilol (COREG) 3.125 MG tablet Take 1 tablet (3.125 mg total) by mouth 2 (two) times daily with a meal. 11/08/16  Yes Isaiah Serge, NP  clopidogrel (PLAVIX) 75 MG tablet Take 1 tablet (75 mg total) by mouth daily. 11/08/16  Yes Isaiah Serge, NP  docusate sodium 100 MG CAPS Take 100 mg by mouth 2 (two) times daily. 02/01/13  Yes Theodis Blaze, MD  ferrous sulfate 325 (65 FE) MG tablet Take 325 mg by mouth 2 (two) times daily.   Yes [provider]  ferumoxytol 510 mg in sodium chloride 0.9 % 100 mL Inject 510 mg into the vein once.   Yes [provider]  gabapentin (NEURONTIN) 300 MG capsule Take 300 mg by mouth 2 (two) times daily.   Yes [provider]  insulin aspart protamine - aspart (NOVOLOG 70/30 MIX) (70-30) 100 UNIT/ML FlexPen Inject 95 Units into the skin 2 (two) times daily.   Yes [provider]  isosorbide-hydrALAZINE (BIDIL) 20-37.5 MG tablet Take 1 tablet by mouth 2 (two) times daily. 11/08/16  Yes Dorene Ar,  Otilio Carpen, NP  metolazone (ZAROXOLYN) 5 MG tablet Take 5 mg by mouth 3 (three) times a week. Mon, Wed, Fri   Yes [provider]  nitroGLYCERIN (NITROSTAT) 0.4 MG SL tablet Place 1 tablet (0.4 mg total) under the tongue every 5 (five) minutes x 3 doses as needed for chest pain. 10/18/16  Yes Cheryln Manly, NP  primidone (MYSOLINE) 50 MG tablet Take 100 mg by mouth daily.   Yes [provider]  vitamin B-12 (CYANOCOBALAMIN) 500 MCG tablet Take 1,000 mcg by mouth daily.   Yes [provider]  furosemide (LASIX) 80 MG tablet Take 80 MG by mouth TWICE DAY FOR THREE DAYS....THEN 80 MG IN AM 40 MG IN PM - Oral Patient not taking: Reported on 05/17/2017 11/08/16   Isaiah Serge, NP  traMADol (ULTRAM) 50 MG tablet Take 2 tablets (100 mg total) by mouth 2 (two) times daily. Patient not taking: Reported on 05/17/2017 02/01/13   Theodis Blaze, MD    Physical  Exam:  Constitutional: Elderly male in NAD, calm, comfortable Vitals:   05/17/17 1800 05/17/17 1830 05/17/17 1845 05/17/17 1900  BP: 138/79 139/76 (!) 139/92 (!) 143/74  Pulse: 73 74 80 78  Resp: 17 (!) 22 (!) 28 (!) 25  Temp:      TempSrc:      SpO2: 96% 94% 97% 97%  Weight:      Height:       Eyes: PERRL, lids and conjunctivae normal ENMT: Mucous membranes are moist. Posterior pharynx clear of any exudate or lesions.  Neck: normal, supple, no masses, no thyromegaly Respiratory: Decreased overall aeration, no wheezing, no crackles. Normal respiratory effort. No accessory muscle use.  Cardiovascular: Regular rate and rhythm, no murmurs / rubs / gallops.  +2 pitting lower  extremity edema. 2+ pedal pulses. No carotid bruits.  Abdomen: no tenderness, no masses palpated. No hepatosplenomegaly. Bowel sounds positive.  Musculoskeletal: no clubbing / cyanosis. No joint deformity upper and lower extremities. Good ROM, no contractures. Normal muscle tone.  Skin: Mild signs of erythema noted on the right calf. Neurologic: CN 2-12 grossly intact. Sensation intact, DTR normal. Strength 5/5 in all 4.  Psychiatric: Normal judgment and insight. Alert and oriented x 3. Normal mood.     Labs on Admission: I have personally reviewed following labs and imaging studies  CBC: Recent Labs  Lab 05/17/17 1550  WBC 9.5  HGB 7.9*  HCT 25.0*  MCV 94.0  PLT 630   Basic Metabolic Panel: Recent Labs  Lab 05/17/17 1550  NA 138  K 4.4  CL 104  CO2 15*  GLUCOSE 338*  BUN 127*  CREATININE 4.48*  CALCIUM 8.1*   GFR: Estimated Creatinine Clearance: 18.1 mL/min (A) (by C-G formula based on SCr of 4.48 mg/dL (H)). Liver Function Tests: No results for input(s): AST, ALT, ALKPHOS, BILITOT, PROT, ALBUMIN in the last 168 hours. No results for input(s): LIPASE, AMYLASE in the last 168 hours. No results for input(s): AMMONIA in the last 168 hours. Coagulation Profile: No results for input(s): INR,  PROTIME in the last 168 hours. Cardiac Enzymes: No results for input(s): CKTOTAL, CKMB, CKMBINDEX, TROPONINI in the last 168 hours. BNP (last 3 results) Recent Labs    10/25/16 1157  PROBNP 10,341*   HbA1C: No results for input(s): HGBA1C in the last 72 hours. CBG: No results for input(s): GLUCAP in the last 168 hours. Lipid Profile: No results for input(s): CHOL, HDL, LDLCALC, TRIG, CHOLHDL, LDLDIRECT in the  last 72 hours. Thyroid Function Tests: No results for input(s): TSH, T4TOTAL, FREET4, T3FREE, THYROIDAB in the last 72 hours. Anemia Panel: No results for input(s): VITAMINB12, FOLATE, FERRITIN, TIBC, IRON, RETICCTPCT in the last 72 hours. Urine analysis:    Component Value Date/Time   COLORURINE YELLOW 01/25/2013 0208   APPEARANCEUR CLEAR 01/25/2013 0208   LABSPEC 1.011 01/25/2013 0208   PHURINE 5.0 01/25/2013 0208   GLUCOSEU NEGATIVE 01/25/2013 0208   HGBUR TRACE (A) 01/25/2013 0208   BILIRUBINUR NEGATIVE 01/25/2013 0208   KETONESUR NEGATIVE 01/25/2013 0208   PROTEINUR 100 (A) 01/25/2013 0208   UROBILINOGEN 1.0 01/25/2013 0208   NITRITE NEGATIVE 01/25/2013 0208   LEUKOCYTESUR NEGATIVE 01/25/2013 0208   Sepsis Labs: No results found for this or any previous visit (from the past 240 hour(s)).   Radiological Exams on Admission: Dg Chest 2 View  Result Date: 05/17/2017 CLINICAL DATA:  Chest pain.  Renal failure. EXAM: CHEST - 2 VIEW COMPARISON:  October 04, 2016 FINDINGS: There is no edema or consolidation. Heart is mildly enlarged with pulmonary vascularity within normal limits. No adenopathy. There is aortic atherosclerosis. There is an old healed fracture of the proximal right humerus, nonunited. IMPRESSION: Cardiomegaly. No edema or consolidation. There is aortic atherosclerosis. There is a nonunited old fracture of the right proximal humerus. Aortic Atherosclerosis (ICD10-I70.0). Electronically Signed   By: Lowella Grip III M.D.   On: 05/17/2017 14:42     EKG: Independently reviewed.  Normal EKG with ectopic rhythm at 81 bpm, RBBB, and T wave changes suggestive of inferior ischemia.  Assessment/Plan Combined systolic and diastolic CHF exacerbation: Acute on chronic.  Patient presents with progressive weight gain after being recently cut back on metolazone due to worsening kidney function.  On admission chest x-ray shows cardiomegaly with no significant pulmonary edema.  BNP elevated at 754.3.  Patient last EF noted to be around 35%. Gven 125 mg of Lasix IV in the emergency department. - Admit to a telemetry bed - Heart failure orders set  initiated  - Continuous pulse oximetry with nasal cannula oxygen as needed to keep O2 saturations >92% - Strict I&Os and daily weights - Elevate lower extremities - Check TSH - Reassess in a.m. and adjust diuresis as needed. - Check echocardiogram - Optimize medical management - Held metolazone and Bumex  NSTEMI, H/O CAD: Acute.  Patient complaint of chest pain yesterday.  Initial troponin elevated at 1.33 on admission.  Patient with known multivessel coronary artery disease.  Suspect exacerbated by CHF. - Continue heparin per pharmacy, but had to be discontinued after signs of acute blood loss. - Trend cardiac troponin - Appreciate cardiology consultative service, follow-up for further recommendations  Acute renal failure on chronic kidney disease stage IV: Last available creatinine noted to be around 2.6-3 in 2018, but patient presents with a creatinine of 3.48 and BUN 127.  Patient has recently had a fistula graft placed approximately 3 weeks ago in preparation for likely need of dialysis.  Nephrology Dr. Augustin Coupe consulted.  Will follow and see in a.m. - Follow-up urinalysis  Metabolic acidosis with elevated anion gap: Acute initial anion gap noted to be 19.  Suspect symptoms likely from uremia with elevated BUN vs DKA in type 2 diabetes - Check venous blood gas and urinalysis  Possible acute blood  loss anemia: Patient not to have a hemoglobin of 7.9 on admission which is slightly less than baseline which appears to be around 8-9.  Patient denies any reports of bleeding. - Recheck  CBC in a.m. - Type and screen for possible need of blood products - Check stool guaiac and transfuse blood if needed  Essential hypertension - Continue Coreg and Bidil as tolerated  Diabetes mellitus type 2: Uncontrolled.  Patient presents with elevated blood glucose of 338 with signs suggestive possibly of DKA.  Last hemoglobin A1c available noted to be 7.6 on 10/05/2017. - Hypoglycemic protocols - Continue NovoLog 70/30 insulin regimen    DVT prophylaxis: heparin Code Status: Full  Family Communication: none present at bedside. Disposition Plan: To be determined Consults called: Cardiology and nephrology Admission status: Inpatient  Norval Morton MD [facility] Pager [phone]   If 7PM-7AM, please contact night-coverage [email or web address] Password [facility]  05/17/2017, 8:56 PM

## 2017-05-17 NOTE — ED Provider Notes (Signed)
Emergency Department Provider Note   I have reviewed the triage vital signs and the nursing notes.   HISTORY  Chief Complaint Abnormal Lab and Chest Pain   HPI Mitchell Garcia is a 72 y.o. male with PMH of CAD s/p stenting and multivessel disease, CHF w/ EF 35%, CKD, DM, and HLD presents to the emergency department for evaluation of worsening generalized weakness with near syncope, dyspnea, and increasing weight gain in the setting of decreasing his metolazone.  The patient is followed by cardiology at the [facility]. They have been following his worsening kidney function and anemia.  He presented there today to be seen for possible iron infusion but the patient's wife states that he reviewed his labs from last week showing worsening kidney function and referred him to the emergency department.  Patient did have some left-sided chest pain yesterday that resolved without intervention.  He has felt more exertional dyspnea.  Today on the way to the [place] appointment he had 2 episodes of near syncope with exertion and needed to be transported into the [place] by wheelchair.  No chest pain during these episodes.  He has noticed some diffuse lower extremity swelling but has had worsening pain in the right lower extremity with some redness.  He completed a course of antibiotics several days ago but the redness persists.  He has no history of DVT.  Last week the patient's metolazone was changed from daily to 3 times per week reportedly because of worsening renal function.  Patient continues to take Bumex daily.   Past Medical History:  Diagnosis Date  . Acute kidney injury superimposed on chronic kidney disease ([place]) 10/15/2016  . Altered mental state 01/25/2013  . Anemia of chronic disease 01/25/2013  . CAD S/P percutaneous coronary angioplasty    a.not to be a candidate for CABG. On 9/19, went back to cath lab for PCI s/p orbital atherectomy with DES to ostial RCA, and PTCA/DES to distal RCA. Residual 50% prox LAD,  70% D1, CTO mLCx noted by initial diagnostic cath 10/08/16 to be treated medically.  . Cancer ([place])   . Chronic combined systolic and diastolic CHF (congestive heart failure) ([place])   . CKD (chronic kidney disease), stage IV ([place])   . Diabetes mellitus with renal complications (HCC)    uncontrolled  . Essential hypertension 10/15/2016  . Humerus head fracture 01/27/2013  . Hyperlipidemia   . Hyponatremia 01/25/2013  . Insulin dependent diabetes mellitus ([place])   . Ischemic cardiomyopathy 10/15/2016  . MI (myocardial infarction) ([place])    years ago  . Stroke ([place])   . Transaminitis 01/25/2013  . Uncontrolled diabetes mellitus ([place]) 10/15/2016    Patient Active Problem List   Diagnosis Date Noted  . Acute blood loss anemia 05/18/2017  . Acute on chronic combined systolic and diastolic CHF (congestive heart failure) ([place]) 05/17/2017  . MI (myocardial infarction) ([place])   . Ischemic cardiomyopathy 10/15/2016  . CAD S/P percutaneous coronary angioplasty 10/15/2016  . Acute kidney injury superimposed on chronic kidney disease ([place]) 10/15/2016  . Uncontrolled diabetes mellitus ([place]) 10/15/2016  . Essential hypertension 10/15/2016  . Acute combined systolic (congestive) and diastolic (congestive) heart failure ([place])   . NSTEMI (non-ST elevated myocardial infarction) ([place]) 10/04/2016  . Acute on chronic diastolic heart failure ([place]) 01/27/2013  . Humerus head fracture 01/27/2013  . Altered mental state 01/25/2013  . Acute renal failure superimposed on chronic kidney disease ([place]) 01/25/2013  . Transaminitis 01/25/2013  . Leukocytosis 01/25/2013  . Anemia  of chronic disease 01/25/2013  . Hyponatremia 01/25/2013  . Hyperglycemia 01/25/2013    Past Surgical History:  Procedure Laterality Date  . CORONARY STENT INTERVENTION N/A 10/13/2016   Procedure: CORONARY STENT INTERVENTION;  Surgeon: Burnell Blanks, MD;  Location: [facility];  Service: Cardiovascular;  Laterality: N/A;  .  LEFT HEART CATH AND CORONARY ANGIOGRAPHY N/A 10/08/2016   Procedure: LEFT HEART CATH AND CORONARY ANGIOGRAPHY;  Surgeon: Nelva Bush, MD;  Location: [facility];  Service: Cardiovascular;  Laterality: N/A;  . SKIN CANCER EXCISION    . TEMPORARY PACEMAKER N/A 10/13/2016   Procedure: Temporary Pacemaker;  Surgeon: Burnell Blanks, MD;  Location: [place] [facility];  Service: Cardiovascular;  Laterality: N/A;      Allergies Oxycodone; Tramadol; Lisinopril; and Losartan  Family History  Problem Relation Age of Onset  . Hyperlipidemia Father     Social History Social History   Tobacco Use  . Smoking status: Never Smoker  . Smokeless tobacco: Never Used  Substance Use Topics  . Alcohol use: No  . Drug use: No    Review of Systems  Constitutional: No fever/chills. Positive generalized weakness.  Eyes: No visual changes. ENT: No sore throat. Cardiovascular: Positive chest pain (yesterday). Positive near syncope. Positive B/L LE swelling worse on the right with some calf redness.  Respiratory: Positive shortness of breath. Gastrointestinal: No abdominal pain.  No nausea, no vomiting.  No diarrhea.  No constipation. Genitourinary: Negative for dysuria. Musculoskeletal: Negative for back pain. Skin: Negative for rash. Neurological: Negative for headaches, focal weakness or numbness.  10-point ROS otherwise negative.  ____________________________________________   PHYSICAL EXAM:  VITAL SIGNS: ED Triage Vitals  Enc Vitals Group     BP 05/17/17 1348 (!) 138/58     Pulse Rate 05/17/17 1348 80     Resp 05/17/17 1348 20     Temp 05/17/17 1348 98.2 F (36.8 C)     Temp Source 05/17/17 1348 Oral     SpO2 05/17/17 1348 100 %     Weight 05/17/17 1347 230 lb (104.3 kg)     Height 05/17/17 1347 5\' 11"  (1.803 m)     Pain Score 05/17/17 1346 4   Constitutional: Alert and oriented. Well appearing and in no acute distress. Eyes: Conjunctivae are normal.  Head:  Atraumatic. Nose: No congestion/rhinnorhea. Mouth/Throat: Mucous membranes are slightly dry.  Neck: No stridor.   Cardiovascular: Normal rate, regular rhythm. Good peripheral circulation. Grossly normal heart sounds.   Respiratory: Normal respiratory effort.  No retractions. Lungs with rales at the bases bilaterally.  Gastrointestinal: Soft and nontender. No distention.  Musculoskeletal: B/L LE pitting edema (3+) with right calf redness and some tenderness to palpation. No gross deformities of extremities. Neurologic:  Normal speech and language. No gross focal neurologic deficits are appreciated.  Skin:  Skin is warm, dry and intact. Mild right calf redness as above.    ____________________________________________   LABS (all labs ordered are listed, but only abnormal results are displayed)  Labs Reviewed  BASIC METABOLIC PANEL - Abnormal; Notable for the following components:      Result Value   CO2 15 (*)    Glucose, Bld 338 (*)    BUN 127 (*)    Creatinine, Ser 4.48 (*)    Calcium 8.1 (*)    GFR calc non Af Amer 12 (*)    GFR calc Af Amer 14 (*)    Anion gap 19 (*)    All other  components within normal limits  CBC - Abnormal; Notable for the following components:   RBC 2.66 (*)    Hemoglobin 7.9 (*)    HCT 25.0 (*)    All other components within normal limits  BRAIN NATRIURETIC PEPTIDE - Abnormal; Notable for the following components:   B Natriuretic Peptide 754.3 (*)    All other components within normal limits  TROPONIN I - Abnormal; Notable for the following components:   Troponin I 0.97 (*)    All other components within normal limits  URINALYSIS, ROUTINE W REFLEX MICROSCOPIC - Abnormal; Notable for the following components:   Color, Urine STRAW (*)    Glucose, UA 50 (*)    Hgb urine dipstick SMALL (*)    Protein, ur 100 (*)    Bacteria, UA RARE (*)    All other components within normal limits  BASIC METABOLIC PANEL - Abnormal; Notable for the following  components:   CO2 16 (*)    Glucose, Bld 320 (*)    BUN 133 (*)    Creatinine, Ser 4.49 (*)    Calcium 7.8 (*)    GFR calc non Af Amer 12 (*)    GFR calc Af Amer 14 (*)    Anion gap 17 (*)    All other components within normal limits  CBC WITH DIFFERENTIAL/PLATELET - Abnormal; Notable for the following components:   RBC 2.34 (*)    Hemoglobin 6.9 (*)    HCT 21.7 (*)    Monocytes Absolute 1.5 (*)    All other components within normal limits  TROPONIN I - Abnormal; Notable for the following components:   Troponin I 1.05 (*)    All other components within normal limits  GLUCOSE, CAPILLARY - Abnormal; Notable for the following components:   Glucose-Capillary 259 (*)    All other components within normal limits  I-STAT TROPONIN, ED - Abnormal; Notable for the following components:   Troponin i, poc 1.33 (*)    All other components within normal limits  CBG MONITORING, ED - Abnormal; Notable for the following components:   Glucose-Capillary 230 (*)    All other components within normal limits  POC OCCULT BLOOD, ED - Abnormal; Notable for the following components:   Fecal Occult Bld POSITIVE (*)    All other components within normal limits  TSH  TROPONIN I  OCCULT BLOOD X 1 CARD TO LAB, STOOL  TYPE AND SCREEN  PREPARE RBC (CROSSMATCH)  ABO/RH   ____________________________________________  EKG   EKG Interpretation  Date/Time:  Tuesday May 17 2017 13:46:01 EDT Ventricular Rate:  81 PR Interval:  208 QRS Duration: 162 QT Interval:  438 QTC Calculation: 508 R Axis:   93 Text Interpretation:  Unusual P axis, possible ectopic atrial rhythm Right bundle branch block T wave abnormality, consider inferior ischemia Abnormal ECG No STEMI. Similar to prior.  Confirmed by Nanda Quinton [phone]) on 05/17/2017 4:58:16 PM       ____________________________________________  RADIOLOGY  Dg Chest 2 View  Result Date: 05/17/2017 CLINICAL DATA:  Chest pain.  Renal failure. EXAM:  CHEST - 2 VIEW COMPARISON:  October 04, 2016 FINDINGS: There is no edema or consolidation. Heart is mildly enlarged with pulmonary vascularity within normal limits. No adenopathy. There is aortic atherosclerosis. There is an old healed fracture of the proximal right humerus, nonunited. IMPRESSION: Cardiomegaly. No edema or consolidation. There is aortic atherosclerosis. There is a nonunited old fracture of the right proximal humerus. Aortic Atherosclerosis (ICD10-I70.0). Electronically Signed  By: Lowella Grip III M.D.   On: 05/17/2017 14:42    ____________________________________________   PROCEDURES  Procedure(s) performed:   .Critical Care Performed by: Margette Fast, MD Authorized by: Margette Fast, MD   Critical care provider statement:    Critical care time (minutes):  35   Critical care time was exclusive of:  Teaching time and separately billable procedures and treating other patients   Critical care was necessary to treat or prevent imminent or life-threatening deterioration of the following conditions:  Cardiac failure and circulatory failure   Critical care was time spent personally by me on the following activities:  Blood draw for specimens, development of treatment plan with patient or surrogate, discussions with consultants, evaluation of patient's response to treatment, examination of patient, obtaining history from patient or surrogate, ordering and performing treatments and interventions, ordering and review of laboratory studies, ordering and review of radiographic studies, pulse oximetry, re-evaluation of patient's condition and review of old charts   I assumed direction of critical care for this patient from another provider in my specialty: no       ____________________________________________   INITIAL IMPRESSION / [place] / ED COURSE  Pertinent labs & imaging results that were available during my care of the patient were reviewed by me and  considered in my medical decision making (see chart for details).  Patient presents to the emergency department for evaluation of lower extremity edema, near syncope, chest pain yesterday in the setting of decreasing his metolazone and concern for acute on chronic renal failure.  Patient has slightly elevated troponin today but no chest pain today.  Doubt primary ACS the patient does have extensive coronary artery disease.  Had evaluation during last admission for possible CABG but patient is a poor operative candidate.  No active chest pain or dyspnea.  Plan for additional lab work, DVT ultrasound of the right lower extremity with some redness and pain in the calf, and reassess.  06:08 PM Poke with nephrology on-call.  They will consult.  No emergent indication for hemodialysis at this time.   08:27 PM With cardiology.  Recommends heparin and 1 time Lasix 120 mg. Will evaluate in the ED but suspect more of a renal etiology rather than acutely worsening CHF. Recommends troponin trending and hospitalist admit.   Discussed patient's case with Hospitalist to request admission. Patient and family (if present) updated with plan. Care transferred to Hospitalist service.  I reviewed all nursing notes, vitals, pertinent old records, EKGs, labs, imaging (as available).  ____________________________________________  FINAL CLINICAL IMPRESSION(S) / ED DIAGNOSES  Final diagnoses:  AKI (acute kidney injury) ([place])  Lower extremity edema  Right leg pain  Precordial chest pain     MEDICATIONS GIVEN DURING THIS VISIT:  Medications  atorvastatin (LIPITOR) tablet 80 mg (80 mg Oral Given 05/17/17 2328)  carvedilol (COREG) tablet 3.125 mg (3.125 mg Oral Given 05/18/17 0851)  ALPRAZolam (XANAX) tablet 0.5 mg (has no administration in time range)  clopidogrel (PLAVIX) tablet 75 mg (75 mg Oral Given 05/18/17 0851)  docusate sodium (COLACE) capsule 100 mg (100 mg Oral Not Given 05/18/17 0852)    isosorbide-hydrALAZINE (BIDIL) 20-37.5 MG per tablet 1 tablet (1 tablet Oral Given 05/18/17 0850)  ferrous sulfate tablet 325 mg (325 mg Oral Given 05/18/17 0851)  nitroGLYCERIN (NITROSTAT) SL tablet 0.4 mg (has no administration in time range)  insulin aspart protamine- aspart (NOVOLOG MIX 70/30) injection 95 Units (has no administration in time range)  gabapentin (NEURONTIN)  capsule 300 mg (300 mg Oral Given 05/17/17 2329)  sodium chloride flush (NS) 0.9 % injection 3 mL (0 mLs Intravenous Duplicate 3/61/44 3154)  sodium chloride flush (NS) 0.9 % injection 3 mL (has no administration in time range)  0.9 %  sodium chloride infusion (has no administration in time range)  acetaminophen (TYLENOL) tablet 650 mg (has no administration in time range)  ondansetron (ZOFRAN) injection 4 mg (has no administration in time range)  furosemide (LASIX) injection 80 mg (has no administration in time range)  furosemide (LASIX) 120 mg in dextrose 5 % 50 mL IVPB (0 mg Intravenous Stopped 05/17/17 2329)  heparin bolus via infusion 2,000 Units (2,000 Units Intravenous Bolus from Bag 05/17/17 2228)  0.9 %  sodium chloride infusion ( Intravenous New Bag/Given 05/18/17 0853)    Note:  This document was prepared using Dragon voice recognition software and may include unintentional dictation errors.  Nanda Quinton, MD Emergency Medicine    Loan [place], [facility], MD 05/18/17 [id number]

## 2017-05-17 NOTE — Progress Notes (Signed)
ANTICOAGULATION CONSULT NOTE - Initial Consult  Pharmacy Consult for Heparin Indication: chest pain/ACS  Allergies  Allergen Reactions  . Oxycodone Other (See Comments)    DIFFICULT TO WAKE UP  . Tramadol Nausea And Vomiting  . Lisinopril     Hyperkalemia   . Losartan     Hyperkalemia     Patient Measurements: Height: 5\' 11"  (180.3 cm) Weight: 230 lb (104.3 kg) IBW/kg (Calculated) : 75.3 Heparin Dosing Weight: 97 kg  Vital Signs: Temp: 98.6 F (37 C) (04/23 1443) Temp Source: Oral (04/23 1443) BP: 143/74 (04/23 1900) Pulse Rate: 78 (04/23 1900)  Labs: Recent Labs    05/17/17 1550  HGB 7.9*  HCT 25.0*  PLT 244  CREATININE 4.48*    Estimated Creatinine Clearance: 18.1 mL/min (A) (by C-G formula based on SCr of 4.48 mg/dL (H)).   Medical History: Past Medical History:  Diagnosis Date  . Acute kidney injury superimposed on chronic kidney disease ([place]) 10/15/2016  . Altered mental state 01/25/2013  . Anemia of chronic disease 01/25/2013  . CAD S/P percutaneous coronary angioplasty    a.not to be a candidate for CABG. On 9/19, went back to cath lab for PCI s/p orbital atherectomy with DES to ostial RCA, and PTCA/DES to distal RCA. Residual 50% prox LAD, 70% D1, CTO mLCx noted by initial diagnostic cath 10/08/16 to be treated medically.  . Cancer ([place])   . Chronic combined systolic and diastolic CHF (congestive heart failure) ([place])   . CKD (chronic kidney disease), stage IV ([place])   . Diabetes mellitus with renal complications (HCC)    uncontrolled  . Essential hypertension 10/15/2016  . Humerus head fracture 01/27/2013  . Hyperlipidemia   . Hyponatremia 01/25/2013  . Insulin dependent diabetes mellitus ([place])   . Ischemic cardiomyopathy 10/15/2016  . MI (myocardial infarction) ([place])    years ago  . Stroke ([place])   . Transaminitis 01/25/2013  . Uncontrolled diabetes mellitus ([place]) 10/15/2016    Assessment: Mitchell Garcia who presented on 4/23 with CP - noted extensive history of  CAD. Pharmacy consulted to start Heparin for ACS.   The patient was noted to have a recent R-AVF placed on 4/1. Will only give a half-bolus due to this recent procedure. Hgb 7.9, plts 244  Goal of Therapy:  Heparin level 0.3-0.7 units/ml Monitor platelets by anticoagulation protocol: Yes   Plan:  1. Heparin 2000 unit bolus x 1 2. Start Heparin at 1350 units/hr (13.5 ml/hr) 3. Daily HL/CBC 4. Will continue to monitor for any signs/symptoms of bleeding and will follow up with heparin level in 8 hours   Thank you for allowing pharmacy to be a part of this patient's care.  Alycia Rossetti, PharmD, BCPS Clinical Pharmacist Pager: [phone] Clinical phone for 05/17/2017: [phone] If after 3:30p, please call main pharmacy at: [phone] 05/17/2017 8:44 PM

## 2017-05-17 NOTE — ED Notes (Signed)
CBG 230, RN notified

## 2017-05-17 NOTE — ED Triage Notes (Signed)
Pt states he was seen at [facility] today for iron infusion and has been having chest pain since yesterday. Pt is restricted on left, has not started dialysis yet but has fistula placed.

## 2017-05-17 NOTE — ED Notes (Signed)
Pt difficult stick, IV team ordered. Multiple IV start attempts.

## 2017-05-18 ENCOUNTER — Other Ambulatory Visit (HOSPITAL_COMMUNITY): Payer: Non-veteran care

## 2017-05-18 DIAGNOSIS — N189 Chronic kidney disease, unspecified: Secondary | ICD-10-CM

## 2017-05-18 DIAGNOSIS — I5043 Acute on chronic combined systolic (congestive) and diastolic (congestive) heart failure: Secondary | ICD-10-CM

## 2017-05-18 DIAGNOSIS — D62 Acute posthemorrhagic anemia: Secondary | ICD-10-CM | POA: Diagnosis present

## 2017-05-18 LAB — CBC WITH DIFFERENTIAL/PLATELET
BASOS ABS: 0 10*3/uL (ref 0.0–0.1)
Basophils Absolute: 0 10*3/uL (ref 0.0–0.1)
Basophils Relative: 0 %
Basophils Relative: 0 %
EOS ABS: 0.2 10*3/uL (ref 0.0–0.7)
Eosinophils Absolute: 0.2 10*3/uL (ref 0.0–0.7)
Eosinophils Relative: 2 %
Eosinophils Relative: 2 %
HCT: 21.7 % — ABNORMAL LOW (ref 39.0–52.0)
HCT: 24.9 % — ABNORMAL LOW (ref 39.0–52.0)
HEMOGLOBIN: 6.9 g/dL — AB (ref 13.0–17.0)
Hemoglobin: 7.9 g/dL — ABNORMAL LOW (ref 13.0–17.0)
LYMPHS PCT: 9 %
LYMPHS PCT: 9 %
Lymphs Abs: 0.9 10*3/uL (ref 0.7–4.0)
Lymphs Abs: 0.8 10*3/uL (ref 0.7–4.0)
MCH: 29.5 pg (ref 26.0–34.0)
MCH: 29.6 pg (ref 26.0–34.0)
MCHC: 31.8 g/dL (ref 30.0–36.0)
MCHC: 31.7 g/dL (ref 30.0–36.0)
MCV: 92.7 fL (ref 78.0–100.0)
MCV: 93.3 fL (ref 78.0–100.0)
MONO ABS: 1.6 10*3/uL — AB (ref 0.1–1.0)
MONOS PCT: 16 %
Monocytes Absolute: 1.5 10*3/uL — ABNORMAL HIGH (ref 0.1–1.0)
Monocytes Relative: 15 %
NEUTROS ABS: 7.4 10*3/uL (ref 1.7–7.7)
NEUTROS PCT: 74 %
Neutro Abs: 7.2 10*3/uL (ref 1.7–7.7)
Neutrophils Relative %: 73 %
PLATELETS: 238 10*3/uL (ref 150–400)
Platelets: 246 10*3/uL (ref 150–400)
RBC: 2.34 MIL/uL — AB (ref 4.22–5.81)
RBC: 2.67 MIL/uL — ABNORMAL LOW (ref 4.22–5.81)
RDW: 15 % (ref 11.5–15.5)
RDW: 15.1 % (ref 11.5–15.5)
WBC: 10 10*3/uL (ref 4.0–10.5)
WBC: 9.8 10*3/uL (ref 4.0–10.5)

## 2017-05-18 LAB — PREPARE RBC (CROSSMATCH)

## 2017-05-18 LAB — GLUCOSE, CAPILLARY
GLUCOSE-CAPILLARY: 335 mg/dL — AB (ref 65–99)
Glucose-Capillary: 259 mg/dL — ABNORMAL HIGH (ref 65–99)
Glucose-Capillary: 301 mg/dL — ABNORMAL HIGH (ref 65–99)
Glucose-Capillary: 289 mg/dL — ABNORMAL HIGH (ref 65–99)

## 2017-05-18 LAB — TROPONIN I
TROPONIN I: 1.05 ng/mL — AB (ref <0.03)
Troponin I: 0.86 ng/mL (ref <0.03)

## 2017-05-18 LAB — BASIC METABOLIC PANEL
ANION GAP: 17 — AB (ref 5–15)
BUN: 133 mg/dL — ABNORMAL HIGH (ref 6–20)
CALCIUM: 7.8 mg/dL — AB (ref 8.9–10.3)
CHLORIDE: 102 mmol/L (ref 101–111)
CO2: 16 mmol/L — AB (ref 22–32)
Creatinine, Ser: 4.49 mg/dL — ABNORMAL HIGH (ref 0.61–1.24)
GFR calc Af Amer: 14 mL/min — ABNORMAL LOW (ref >60)
GFR calc non Af Amer: 12 mL/min — ABNORMAL LOW (ref >60)
Glucose, Bld: 320 mg/dL — ABNORMAL HIGH (ref 65–99)
POTASSIUM: 3.9 mmol/L (ref 3.5–5.1)
Sodium: 135 mmol/L (ref 135–145)

## 2017-05-18 LAB — ABO/RH: ABO/RH(D): A POS

## 2017-05-18 LAB — POC OCCULT BLOOD, ED: FECAL OCCULT BLD: POSITIVE — AB

## 2017-05-18 LAB — TSH: TSH: 0.722 u[IU]/mL (ref 0.350–4.500)

## 2017-05-18 MED ORDER — INSULIN DETEMIR 100 UNIT/ML ~~LOC~~ SOLN
20.0000 [IU] | Freq: Two times a day (BID) | SUBCUTANEOUS | Status: DC
  Administered 2017-05-18 – 2017-05-20 (×4): 20 [IU] via SUBCUTANEOUS
  Filled 2017-05-18 (×4): qty 0.2

## 2017-05-18 MED ORDER — INSULIN ASPART 100 UNIT/ML ~~LOC~~ SOLN
0.0000 [IU] | Freq: Three times a day (TID) | SUBCUTANEOUS | Status: DC
  Administered 2017-05-19: 2 [IU] via SUBCUTANEOUS

## 2017-05-18 MED ORDER — ASPIRIN EC 81 MG PO TBEC
81.0000 mg | DELAYED_RELEASE_TABLET | Freq: Every day | ORAL | Status: DC
Start: 2017-05-18 — End: 2017-05-24
  Administered 2017-05-18 – 2017-05-24 (×7): 81 mg via ORAL
  Filled 2017-05-18 (×6): qty 1

## 2017-05-18 MED ORDER — FUROSEMIDE 10 MG/ML IJ SOLN
80.0000 mg | Freq: Three times a day (TID) | INTRAMUSCULAR | Status: DC
  Administered 2017-05-18 – 2017-05-19 (×4): 80 mg via INTRAVENOUS
  Filled 2017-05-18 (×4): qty 8

## 2017-05-18 MED ORDER — CLOPIDOGREL BISULFATE 75 MG PO TABS
75.0000 mg | ORAL_TABLET | Freq: Every day | ORAL | Status: DC
  Administered 2017-05-19 – 2017-05-24 (×6): 75 mg via ORAL
  Filled 2017-05-18 (×6): qty 1

## 2017-05-18 MED ORDER — SODIUM CHLORIDE 0.9 % IV SOLN
Freq: Once | INTRAVENOUS | Status: AC
  Administered 2017-05-18: 09:00:00 via INTRAVENOUS

## 2017-05-18 NOTE — Progress Notes (Signed)
Pt arrived from ED. Blood is transfusing. Pt cleaned with CHG wipes and placed on telemetry. Vital signs stable. Plan of care reviewed with patient and pt's wife via telephone per pt's request. Pt has no complaints at this time. Will report off to dayshift RN.

## 2017-05-18 NOTE — Consult Note (Addendum)
Chief Complaint: Patient was seen in consultation today for tunneled dialysis catheter placement Chief Complaint  Patient presents with  . Abnormal Lab  . Chest Pain   at the request of Dr Katheran James   Supervising Physician: Marybelle Killings  Patient Status: [facility] - In-pt  History of Present Illness: Mitchell Garcia is a 72 y.o. male   CKD 5 Follows with a Nephrologist in [place] fistula placed 3 weeks ago -- still maturing Developed SOB and lower extr edema Came to ED and admitted  Now worsening renal function Will need dialysis this admission Plan for intermittent dialysis now per Nephrology  Now scheduled for tunneled dialysis catheter placement In IR 4/25  LD Plavix today Cardiac stent 9/18--- needs Plavix 1 yr per notes  1. Severe, calcified stenosis in the ostium of the RCA.  2. Successful orbital atherectomy of the ostial RCA with placement of a Synergy drug eluting stent 3. Severe distal RCA stenosis.  4. Successful PTCA/DES x 1 distal RCA (Synergy DES).  Recommendations: Continue DAPT with ASA and Plavix for at least one year.  Discussed with Dr Barbie Banner--- ok to continue Plavix  Past Medical History:  Diagnosis Date  . Acute kidney injury superimposed on chronic kidney disease ([place]) 10/15/2016  . Altered mental state 01/25/2013  . Anemia of chronic disease 01/25/2013  . CAD S/P percutaneous coronary angioplasty    a.not to be a candidate for CABG. On 9/19, went back to cath lab for PCI s/p orbital atherectomy with DES to ostial RCA, and PTCA/DES to distal RCA. Residual 50% prox LAD, 70% D1, CTO mLCx noted by initial diagnostic cath 10/08/16 to be treated medically.  . Cancer ([place])   . Chronic combined systolic and diastolic CHF (congestive heart failure) ([place])   . CKD (chronic kidney disease), stage IV ([place])   . Diabetes mellitus with renal complications (HCC)    uncontrolled  . Essential hypertension 10/15/2016  . Humerus head fracture 01/27/2013  . Hyperlipidemia    . Hyponatremia 01/25/2013  . Insulin dependent diabetes mellitus ([place])   . Ischemic cardiomyopathy 10/15/2016  . MI (myocardial infarction) ([place])    years ago  . Stroke ([place])   . Transaminitis 01/25/2013  . Uncontrolled diabetes mellitus ([place]) 10/15/2016    Past Surgical History:  Procedure Laterality Date  . CORONARY STENT INTERVENTION N/A 10/13/2016   Procedure: CORONARY STENT INTERVENTION;  Surgeon: Burnell Blanks, MD;  Location: [facility];  Service: Cardiovascular;  Laterality: N/A;  . LEFT HEART CATH AND CORONARY ANGIOGRAPHY N/A 10/08/2016   Procedure: LEFT HEART CATH AND CORONARY ANGIOGRAPHY;  Surgeon: Nelva Bush, MD;  Location: [facility];  Service: Cardiovascular;  Laterality: N/A;  . SKIN CANCER EXCISION    . TEMPORARY PACEMAKER N/A 10/13/2016   Procedure: Temporary Pacemaker;  Surgeon: Burnell Blanks, MD;  Location: [facility];  Service: Cardiovascular;  Laterality: N/A;    Allergies: Oxycodone; Tramadol; Lisinopril; and Losartan  Medications: Prior to Admission medications   Medication Sig Start Date End Date Taking? Authorizing Provider  ALPRAZolam Duanne Moron) 0.5 MG tablet Take 1 tablet (0.5 mg total) by mouth at bedtime as needed for anxiety. 02/01/13  Yes Theodis Blaze, MD  aspirin EC 81 MG tablet Take 81 mg by mouth daily.   Yes [provider]  atorvastatin (LIPITOR) 80 MG tablet Take 80 mg by mouth at bedtime.   Yes [provider]  bumetanide (BUMEX) 2 MG tablet Take 2 mg by  mouth 2 (two) times daily.   Yes [provider]  carvedilol (COREG) 3.125 MG tablet Take 1 tablet (3.125 mg total) by mouth 2 (two) times daily with a meal. 11/08/16  Yes Isaiah Serge, NP  clopidogrel (PLAVIX) 75 MG tablet Take 1 tablet (75 mg total) by mouth daily. 11/08/16  Yes Isaiah Serge, NP  docusate sodium 100 MG CAPS Take 100 mg by mouth 2 (two) times daily. 02/01/13  Yes Theodis Blaze, MD  ferrous sulfate 325 (65  FE) MG tablet Take 325 mg by mouth 2 (two) times daily.   Yes [provider]  ferumoxytol 510 mg in sodium chloride 0.9 % 100 mL Inject 510 mg into the vein once.   Yes [provider]  gabapentin (NEURONTIN) 300 MG capsule Take 300 mg by mouth 2 (two) times daily.   Yes [provider]  insulin aspart protamine - aspart (NOVOLOG 70/30 MIX) (70-30) 100 UNIT/ML FlexPen Inject 95 Units into the skin 2 (two) times daily.   Yes [provider]  isosorbide-hydrALAZINE (BIDIL) 20-37.5 MG tablet Take 1 tablet by mouth 2 (two) times daily. 11/08/16  Yes Isaiah Serge, NP  metolazone (ZAROXOLYN) 5 MG tablet Take 5 mg by mouth 3 (three) times a week. Mon, Wed, Fri   Yes [provider]  nitroGLYCERIN (NITROSTAT) 0.4 MG SL tablet Place 1 tablet (0.4 mg total) under the tongue every 5 (five) minutes x 3 doses as needed for chest pain. 10/18/16  Yes Cheryln Manly, NP  primidone (MYSOLINE) 50 MG tablet Take 100 mg by mouth daily.   Yes [provider]  vitamin B-12 (CYANOCOBALAMIN) 500 MCG tablet Take 1,000 mcg by mouth daily.   Yes [provider]  furosemide (LASIX) 80 MG tablet Take 80 MG by mouth TWICE DAY FOR THREE DAYS....THEN 80 MG IN AM 40 MG IN PM - Oral Patient not taking: Reported on 05/17/2017 11/08/16   Isaiah Serge, NP  traMADol (ULTRAM) 50 MG tablet Take 2 tablets (100 mg total) by mouth 2 (two) times daily. Patient not taking: Reported on 05/17/2017 02/01/13   Theodis Blaze, MD     Family History  Problem Relation Age of Onset  . Hyperlipidemia Father     Social History   Socioeconomic History  . Marital status: Married    Spouse name: Not on file  . Number of children: Not on file  . Years of education: Not on file  . Highest education level: Not on file  Occupational History  . Not on file  Social Needs  . Financial resource strain: Not on file  . Food insecurity:    Worry: Not on file    Inability: Not on  file  . Transportation needs:    Medical: Not on file    Non-medical: Not on file  Tobacco Use  . Smoking status: Never Smoker  . Smokeless tobacco: Never Used  Substance and Sexual Activity  . Alcohol use: No  . Drug use: No  . Sexual activity: Not on file  Lifestyle  . Physical activity:    Days per week: Not on file    Minutes per session: Not on file  . Stress: Not on file  Relationships  . Social connections:    Talks on phone: Not on file    Gets together: Not on file    Attends religious service: Not on file    Active member of club or organization: Not on file  Attends meetings of clubs or organizations: Not on file    Relationship status: Not on file  Other Topics Concern  . Not on file  Social History Narrative  . Not on file    Review of Systems: A 12 point ROS discussed and pertinent positives are indicated in the HPI above.  All other systems are negative.  Review of Systems  Constitutional: Positive for activity change, appetite change and fatigue. Negative for fever.  Respiratory: Positive for shortness of breath.   Cardiovascular: Negative for chest pain.  Gastrointestinal: Negative for abdominal pain.  Musculoskeletal: Positive for gait problem.  Neurological: Positive for weakness.  Psychiatric/Behavioral: Negative for behavioral problems and confusion.    Vital Signs: BP 127/79 (BP Location: Left Arm)   Pulse 74   Temp 97.8 F (36.6 C) (Oral)   Resp 17   Ht 6' (1.829 m)   Wt 237 lb 7 oz (107.7 kg)   SpO2 93%   BMI 32.20 kg/m   Physical Exam  Constitutional: He is oriented to person, place, and time.  Cardiovascular: Normal rate and regular rhythm.  Pulmonary/Chest: He has wheezes in the right upper field, the right middle field, the right lower field, the left upper field, the left middle field and the left lower field.  Abdominal: Soft. Bowel sounds are normal.  Musculoskeletal: Normal range of motion.       Right lower leg: He  exhibits edema.       Left lower leg: He exhibits edema.  Neurological: He is alert and oriented to person, place, and time.  Skin: Skin is warm and dry.  Psychiatric: He has a normal mood and affect. His behavior is normal.  Nursing note and vitals reviewed.   Imaging: Dg Chest 2 View  Result Date: 05/17/2017 CLINICAL DATA:  Chest pain.  Renal failure. EXAM: CHEST - 2 VIEW COMPARISON:  October 04, 2016 FINDINGS: There is no edema or consolidation. Heart is mildly enlarged with pulmonary vascularity within normal limits. No adenopathy. There is aortic atherosclerosis. There is an old healed fracture of the proximal right humerus, nonunited. IMPRESSION: Cardiomegaly. No edema or consolidation. There is aortic atherosclerosis. There is a nonunited old fracture of the right proximal humerus. Aortic Atherosclerosis (ICD10-I70.0). Electronically Signed   By: Lowella Grip III M.D.   On: 05/17/2017 14:42    Labs:  CBC: Recent Labs    10/25/16 1157 11/01/16 1532 05/17/17 1550 05/18/17 0254  WBC 9.5 8.1 9.5 10.0  HGB 8.8* 8.7* 7.9* 6.9*  HCT 27.4* 26.6* 25.0* 21.7*  PLT 407* 328 244 246    COAGS: Recent Labs    10/07/16 1635  INR 1.03    BMP: Recent Labs    10/28/16 0952 11/01/16 1532 05/17/17 1550 05/18/17 0254  NA 143 144 138 135  K 5.0 4.9 4.4 3.9  CL 108* 108* 104 102  CO2 20 20 15* 16*  GLUCOSE 136* 145* 338* 320*  BUN 55* 66* 127* 133*  CALCIUM 8.9 8.5* 8.1* 7.8*  CREATININE 2.46* 2.69* 4.48* 4.49*  GFRNONAA 25* 22* 12* 12*  GFRAA 29* 26* 14* 14*    LIVER FUNCTION TESTS: Recent Labs    10/17/16 1006  ALBUMIN 2.5*    TUMOR MARKERS: No results for input(s): AFPTM, CEA, CA199, CHROMGRNA in the last 8760 hours.  Assessment and Plan:  CKD 5 Worsening renal fxn RUE fistula just 80 weeks old Need to initiate dialysis now per Nephrology Scheduled for tunneled dialysis catheter placement Risks and benefits discussed  with the patient including, but not  limited to bleeding, infection, vascular injury, pneumothorax which may require chest tube placement, air embolism or even death  All of the patient's questions were answered, patient is agreeable to proceed. Consent signed and in chart.  Thank you for this interesting consult.  I greatly enjoyed meeting MICHAEAL DAVIS and look forward to participating in their care.  A copy of this report was sent to the requesting provider on this date.  Electronically Signed: Lavonia Drafts, PA-C 05/18/2017, 1:01 PM   I spent a total of 20 Minutes    in face to face in clinical consultation, greater than 50% of which was counseling/coordinating care for tunneled dialysis cath placement

## 2017-05-18 NOTE — Progress Notes (Signed)
Notified by April at [facility]- pt is established at the [facility] - PCP is Dr. Romero Liner, [place] contact for PCP is Drue Stager- [phone] ext. [phone] or pager [phone]

## 2017-05-18 NOTE — Progress Notes (Signed)
Inpatient Diabetes Program Recommendations  AACE/ADA: New Consensus Statement on Inpatient Glycemic Control (2015)  Target Ranges:  Prepandial:   less than 140 mg/dL      Peak postprandial:   less than 180 mg/dL (1-2 hours)      Critically ill patients:  140 - 180 mg/dL   Lab Results  Component Value Date   GLUCAP 301 (H) 05/18/2017   HGBA1C 7.6 (H) 10/05/2016    Review of Glycemic ControlResults for Mitchell Garcia, Mitchell Garcia (MRN [id number]) as of 05/18/2017 14:30  Ref. Range 05/17/2017 23:40 05/18/2017 06:33 05/18/2017 11:18  Glucose-Capillary Latest Ref Range: 65 - 99 mg/dL 230 (H) 259 (H) 301 (H)    Diabetes history: Type 2 DM  Outpatient Diabetes medications:  Novolog 70/30 -95 units bid Current orders for Inpatient glycemic control:  Novolog 70/30-95 units bid  Inpatient Diabetes Program Recommendations:    Spoke at length to patient and wife.  Patient states that he varies the amount of insulin he takes based on what he eats and what his blood sugar is.  Based on our discussion, he rarely takes 95 units of 70/30.  A couple of months ago, EMS had to come due to blood sugar of 20.  He is now afraid of low blood sugars.  We discussed his usual routine which varies day to day.  Wife states he sometimes skips lunch in hopes that it will improve blood sugar values.  He has been on Lantus/Novolog in the past and he states "I was doing fine on that".  Based on current renal function and start of dialysis, may do better with a basal/bolus regimen so that doses can be adjusted per meal and daily routine.  Discussed with Dr. Aggie Moats at bedside.  Wife very hopeful that patient can be on insulin regimen that is more physiologic and less risk for hypoglycemia.    Recommendations:  -Discontinue 70/30.   -Consider ordering:  - Levemir 16 units bid (0.3 units/kg)  -Add Novolog meal coverage 5 units tid with meals  -Add Novolog sensitive correction tid with meals  Thanks,  Adah Perl, RN,  BC-ADM Inpatient Diabetes Coordinator Pager [phone] (8a-5p)

## 2017-05-18 NOTE — Progress Notes (Signed)
Progress Note  Patient Name: Mitchell Garcia Date of Encounter: 05/18/2017  Primary Cardiologist:[place]    Subjective   Breathing fair   No CP    Inpatient Medications    Scheduled Meds: . atorvastatin  80 mg Oral QHS  . carvedilol  3.125 mg Oral BID WC  . clopidogrel  75 mg Oral Daily  . docusate sodium  100 mg Oral BID  . ferrous sulfate  325 mg Oral BID WC  . gabapentin  300 mg Oral QHS  . insulin aspart protamine- aspart  95 Units Subcutaneous BID WC  . isosorbide-hydrALAZINE  1 tablet Oral BID  . sodium chloride flush  3 mL Intravenous Q12H   Continuous Infusions: . sodium chloride     PRN Meds: sodium chloride, acetaminophen, ALPRAZolam, nitroGLYCERIN, ondansetron (ZOFRAN) IV, sodium chloride flush   Vital Signs    Vitals:   05/18/17 0600 05/18/17 0627 05/18/17 0836 05/18/17 0843  BP: (!) 119/102 (!) 142/78 (!) 142/84 (!) 139/94  Pulse: 75 92 67   Resp:   (!) 21 (!) 26  Temp:   98.2 F (36.8 C) 98.3 F (36.8 C)  TempSrc:   Oral Oral  SpO2: 90% 95% 98% 96%  Weight:  237 lb 7 oz (107.7 kg)    Height:  6' (1.829 m)      Intake/Output Summary (Last 24 hours) at 05/18/2017 0928 Last data filed at 05/18/2017 0843 Gross per 24 hour  Intake 415 ml  Output 500 ml  Net -85 ml   Filed Weights   05/17/17 1347 05/18/17 0627  Weight: 230 lb (104.3 kg) 237 lb 7 oz (107.7 kg)    Telemetry    SR  - Personally Reviewed  ECG      Physical Exam   GEN: OBese 72 yo  acute distress.   Neck: Neck full  JVP increased Cardiac: RRR, no murmurs, rubs, or gallops.  Respiratory: Relatively clear, mild rhonchi GI: Soft, nontender, distended   Obese  MS: 1-2+ edema; No deformity. Neuro:  Nonfocal  Psych: Normal affect   Labs    Chemistry Recent Labs  Lab 05/17/17 1550 05/18/17 0254  NA 138 135  K 4.4 3.9  CL 104 102  CO2 15* 16*  GLUCOSE 338* 320*  BUN 127* 133*  CREATININE 4.48* 4.49*  CALCIUM 8.1* 7.8*  GFRNONAA 12* 12*  GFRAA 14* 14*    ANIONGAP 19* 17*     Hematology Recent Labs  Lab 05/17/17 1550 05/18/17 0254  WBC 9.5 10.0  RBC 2.66* 2.34*  HGB 7.9* 6.9*  HCT 25.0* 21.7*  MCV 94.0 92.7  MCH 29.7 29.5  MCHC 31.6 31.8  RDW 15.1 15.0  PLT 244 246    Cardiac Enzymes Recent Labs  Lab 05/17/17 2052 05/18/17 0254  TROPONINI 0.97* 1.05*    Recent Labs  Lab 05/17/17 1642  TROPIPOC 1.33*     BNP Recent Labs  Lab 05/17/17 1550  BNP 754.3*     DDimer No results for input(s): DDIMER in the last 168 hours.   Radiology    Dg Chest 2 View  Result Date: 05/17/2017 CLINICAL DATA:  Chest pain.  Renal failure. EXAM: CHEST - 2 VIEW COMPARISON:  October 04, 2016 FINDINGS: There is no edema or consolidation. Heart is mildly enlarged with pulmonary vascularity within normal limits. No adenopathy. There is aortic atherosclerosis. There is an old healed fracture of the proximal right humerus, nonunited. IMPRESSION: Cardiomegaly. No edema or consolidation. There is aortic atherosclerosis. There  is a nonunited old fracture of the right proximal humerus. Aortic Atherosclerosis (ICD10-I70.0). Electronically Signed   By: Lowella Grip III M.D.   On: 05/17/2017 14:42    Cardiac Studies   ------------------------------------------------------------------- Echo 9.10.18   ------------------------------------------------------------------- Left ventricle:  Poor acoustic windows limit studly Defiinity used. LVEF is approximately 35% with diffuse hypokinesis. The cavity size was mildly dilated. Wall thickness was increased in a pattern of mild LVH. Doppler parameters are consistent with abnormal left ventricular relaxation (grade 1 diastolic dysfunction). Doppler parameters are consistent with high ventricular filling pressure.   ------------------------------------------------------------------- Aortic valve:   Mildly thickened, mildly calcified leaflets.    ------------------------------------------------------------------- Mitral valve:   Mildly thickened leaflets .  Doppler:  There was trivial regurgitation.    Peak gradient (D): 3 mm Hg.  ------------------------------------------------------------------- Left atrium:  The atrium was normal in size.  ------------------------------------------------------------------- Right ventricle:  The cavity size was normal. Wall thickness was normal. Systolic function was normal.  ------------------------------------------------------------------- Pulmonic valve:    Structurally normal valve.   Cusp separation was normal.  Doppler:  Transvalvular velocity was within the normal range. There was no regurgitation.  ------------------------------------------------------------------- Tricuspid valve:   Structurally normal valve.   Leaflet separation was normal.  Doppler:  Transvalvular velocity was within the normal range. There was no regurgitation.  ------------------------------------------------------------------- Right atrium:  The atrium was normal in size.  ------------------------------------------------------------------- Pericardium:  There was no pericardial effusion.  ------------------------------------------------------------------- Measurements   Left ventricle                           Value        Reference  LV ID, ED, PLAX chordal        (H)       55    mm     43 - 52  LV ID, ES, PLAX chordal        (H)       44.9  mm     23 - 38  LV fx shortening, PLAX chordal (L)       18    %      >=29  LV PW thickness, ED                      11    mm     ---------  IVS/LV PW ratio, ED            (H)       1.35         <=1.3  Stroke volume, 2D                        55    ml     ---------  Stroke volume/bsa, 2D                    23    ml/m^2 ---------  LV e&', lateral                           4.24  cm/s   ---------  LV E/e&', lateral                         21.16         ---------  LV e&', medial  4.35  cm/s   ---------  LV E/e&', medial                          20.62        ---------  LV e&', average                           4.3   cm/s   ---------  LV E/e&', average                         20.88        ---------    Ventricular septum                       Value        Reference  IVS thickness, ED                        14.9  mm     ---------    LVOT                                     Value        Reference  LVOT ID, S                               20    mm     ---------  LVOT area                                3.14  cm^2   ---------  LVOT peak velocity, S                    87.5  cm/s   ---------  LVOT mean velocity, S                    62.8  cm/s   ---------  LVOT VTI, S                              17.5  cm     ---------    Aorta                                    Value        Reference  Aortic root ID, ED                       28    mm     ---------    Left atrium                              Value        Reference  LA ID, A-P, ES                           44    mm     ---------  LA ID/bsa, A-P  1.82  cm/m^2 <=2.2  LA volume, S                             56.9  ml     ---------  LA volume/bsa, S                         23.5  ml/m^2 ---------  LA volume, ES, 1-p A4C                   61.8  ml     ---------  LA volume/bsa, ES, 1-p A4C               25.5  ml/m^2 ---------  LA volume, ES, 1-p A2C                   47.6  ml     ---------  LA volume/bsa, ES, 1-p A2C               19.7  ml/m^2 ---------    Mitral valve                             Value        Reference  Mitral E-wave peak velocity              89.7  cm/s   ---------  Mitral A-wave peak velocity              98.5  cm/s   ---------  Mitral deceleration time       (H)       268   ms     150 - 230  Mitral peak gradient, D                  3     mm Hg  ---------  Mitral E/A ratio, peak                   0.9          ---------    Right  ventricle                          Value        Reference  TAPSE                                    25.6  mm     ---------  RV s&', lateral, S                        14.4  cm/s   ---------  Legend: (L)  and  (H)  mark values outside specified reference range.  ------------------------------------------------------------------- Prepared and Electronically Authenticated by  Dorris Carnes, M.D. 2018-09-10T17:52:26  Cath 10/08/16 Coronary Findings   Diagnostic  Dominance: Right  Left Main  Vessel is large. Vessel is angiographically normal.  Left Anterior Descending  Prox LAD lesion 50% stenosed  Prox LAD lesion.  Dist LAD lesion 20% stenosed  Dist LAD lesion.  First Diagonal Branch  Vessel is moderate in size.  1st Diag lesion 70% stenosed  1st Diag lesion.  Left Circumflex  Vessel is moderate in size.  Mid Cx lesion 100% stenosed  The lesion  is chronically occluded with left-to-left collateral flow.  First Obtuse Marginal Branch  Vessel is moderate in size.  Right Coronary Artery  Vessel is large.  Ost RCA lesion 80% stenosed  The lesion is eccentric. The lesion is moderately calcified. Ostial stenosis with pressure dampening a 63F diagnostic catheter.  Mid RCA lesion 30% stenosed  The lesion is irregular.  Dist RCA lesion 95% stenosed  Dist RCA lesion.  Right Posterior Descending Artery  Vessel is large in size.  Right Posterior Atrioventricular Branch  Vessel is large in size.  Intervention    Procedures  10/13/16 CORONARY STENT INTERVENTION  Temporary Pacemaker  Conclusion   1. Severe, calcified stenosis in the ostium of the RCA.  2. Successful orbital atherectomy of the ostial RCA with placement of a Synergy drug eluting stent 3. Severe distal RCA stenosis.  4. Successful PTCA/DES x 1 distal RCA (Synergy DES).   Recommendations: Continue DAPT with ASA and Plavix for at least one year.      Patient Profile     72 y.o. male with history of CAD, ICM,  CKD, HTN, HL, DM  Presented yesterday wit hfatigue, SOB, edema    Assessment & Plan    1  Acute on chronic systolic CHF  Volume is up on exam  This is prob explaining pt's symtpoms   I am not convinced of active angina   Pt to be seen nephrology  Note plans for IV lasix with possible hemodialysis  Will follow    2  Near syncope  Episode occurred yesteday while pt walking to car  Felt bad, SOB at time   Follow    2  CP / CAD   Pt with known CAD  NSTEMI last fall   Cath showed   Severe and diffuse  Last coronary intervention in 09/2016  Episode of chest discomfort yesterday  INitial trop 1.33 then 0.97  Would trend  I am not convinced of active ischemi outside of known severe dz     Cont asa and plavix with intervention / stent in fall   On plavix  Resume ASA    3  CKD   Func has been decliniing  Has AV fistula  Plans as noted by nephrology    4  Anemia   Hgb 6.9  Follow closely   If drops further will need transfuiosn   For questions or updates, please contact [facility] Please consult [email or web address] for contact info under Cardiology/STEMI.      Signed, Dorris Carnes, MD  05/18/2017, 9:28 AM

## 2017-05-18 NOTE — Progress Notes (Signed)
[facility] PROGRESS NOTE  ALISTER STAVER YSA:[id number] DOB: 1945-12-19 DOA: 05/17/2017 PCP: [name], MD  Assessment/Plan:  CHF exacerbation Continue diuresis Strict I's and O's Echo checked HD catheter to be placed tomorrow 1500cc Fluid restriction Cont Bidil  NSTEMI Neurology following Stop due to bleed  Anxiety Continue Xanax  Hyperlipidemia Continue Lipitor  Coronary artery disease Continue Plavix and aspirin .  Nitroglycerin sublingual  Chronic pain Continue Neurontin  Code Status: FC  DVT Prophylaxis: SCDs Family Communication: none available Disposition Plan: Pending Improvement  Status: inpt sdu  Elwin Mocha, MD Family Medicine [facility] [email or web address] Password [email or web address]   Consultants:  IR, cardio, nephro  Procedures:  HD cath pending  Antibiotics:  n/a (indicate start date, and stop date if known)  HPI/Subjective: Anxious about procedure. Otherwise well.  Objective: Vitals:   05/18/17 1605 05/18/17 1945  BP: 112/89 125/87  Pulse: 90 76  Resp: (!) 21 (!) 23  Temp: 98 F (36.7 C) 98.1 F (36.7 C)  SpO2: 91% 99%    Intake/Output Summary (Last 24 hours) at 05/18/2017 2115 Last data filed at 05/18/2017 2000 Gross per 24 hour  Intake 1135 ml  Output 1500 ml  Net -365 ml   Filed Weights   05/17/17 1347 05/18/17 0627  Weight: 104.3 kg (230 lb) 107.7 kg (237 lb 7 oz)    Exam:  General: NCAT, NAD Cardiovascular: RRR, no MRG Respiratory: CTAB, nl wob Abdomen: NS, BS+, NTTP Musculoskeletal: moving all extr, nl tone   Data Reviewed: Basic Metabolic Panel: Recent Labs  Lab 05/17/17 1550 05/18/17 0254  NA 138 135  K 4.4 3.9  CL 104 102  CO2 15* 16*  GLUCOSE 338* 320*  BUN 127* 133*  CREATININE 4.48* 4.49*  CALCIUM 8.1* 7.8*   Liver Function Tests: No results for input(s): AST, ALT, ALKPHOS, BILITOT, PROT, ALBUMIN in the last 168 hours. No results for input(s): LIPASE, AMYLASE in the last 168  hours. No results for input(s): AMMONIA in the last 168 hours. CBC: Recent Labs  Lab 05/17/17 1550 05/18/17 0254 05/18/17 1441  WBC 9.5 10.0 9.8  NEUTROABS  --  7.4 7.2  HGB 7.9* 6.9* 7.9*  HCT 25.0* 21.7* 24.9*  MCV 94.0 92.7 93.3  PLT 244 246 238   Cardiac Enzymes: Recent Labs  Lab 05/17/17 2052 05/18/17 0254 05/18/17 0903  TROPONINI 0.97* 1.05* 0.86*   BNP (last 3 results) Recent Labs    10/04/16 1210 10/09/16 0346 05/17/17 1550  BNP 943.6* 546.7* 754.3*    ProBNP (last 3 results) Recent Labs    10/25/16 1157  PROBNP 10,341*    CBG: Recent Labs  Lab 05/17/17 2340 05/18/17 0633 05/18/17 1118 05/18/17 1607  GLUCAP 230* 259* 301* 289*    No results found for this or any previous visit (from the past 240 hour(s)).   Studies: Dg Chest 2 View  Result Date: 05/17/2017 CLINICAL DATA:  Chest pain.  Renal failure. EXAM: CHEST - 2 VIEW COMPARISON:  October 04, 2016 FINDINGS: There is no edema or consolidation. Heart is mildly enlarged with pulmonary vascularity within normal limits. No adenopathy. There is aortic atherosclerosis. There is an old healed fracture of the proximal right humerus, nonunited. IMPRESSION: Cardiomegaly. No edema or consolidation. There is aortic atherosclerosis. There is a nonunited old fracture of the right proximal humerus. Aortic Atherosclerosis (ICD10-I70.0). Electronically Signed   By: Lowella Grip III M.D.   On: 05/17/2017 14:42    Scheduled Meds: . aspirin EC  81 mg Oral  Daily  . atorvastatin  80 mg Oral QHS  . carvedilol  3.125 mg Oral BID WC  . [START ON 05/19/2017] clopidogrel  75 mg Oral Daily  . docusate sodium  100 mg Oral BID  . ferrous sulfate  325 mg Oral BID WC  . furosemide  80 mg Intravenous Q8H  . gabapentin  300 mg Oral QHS  . [START ON 05/19/2017] insulin aspart  0-15 Units Subcutaneous TID WC  . insulin detemir  20 Units Subcutaneous BID  . isosorbide-hydrALAZINE  1 tablet Oral BID  . sodium chloride  flush  3 mL Intravenous Q12H   Continuous Infusions: . sodium chloride      Principal Problem:   Acute on chronic combined systolic and diastolic CHF (congestive heart failure) (HCC) Active Problems:   Acute renal failure superimposed on chronic kidney disease (HCC)   NSTEMI (non-ST elevated myocardial infarction) ([place])   CAD S/P percutaneous coronary angioplasty   Uncontrolled diabetes mellitus ([place])   Acute blood loss anemia    Time spent: [facility] Pager AMION. If 7PM-7AM, please contact night-coverage at [email or web address], password [facility] 05/18/2017, 9:15 PM  LOS: 1 day

## 2017-05-18 NOTE — ED Notes (Signed)
Provider was notified of patient critical CBC result. Heparin drip d/c. IV team at bedside inserting second line

## 2017-05-18 NOTE — Consult Note (Signed)
[facility] Consult Note     Date: 05/18/2017                  Patient Name:  KUNTA HILLEARY  MRN: [id number]  DOB: March 08, 1945  Age / Sex: 72 y.o., male         PCP: [name], MD                 Service Requesting Consult: Internal medicine                 Reason for Consult: Acute renal failure            Chief Complaint: Shortness of breath HPI: Patient is a 72 year old male presenting with shortness of breath and chest pain in the setting of acute on chronic combined systolic and diastolic heart failure.  PMH significant for CAD, CKDIV, DM, chronic anemia, HTN.  Patient is followed by nephrology [place] in [place].  He was found to have worsening kidney function last week and was instructed to decrease his metolazone use to every other day and continue Bumex daily.  Patient follow-up on 4/23 with persistent and worsened kidney function.  She was also complaining of increased shortness of breath and chest discomfort.  Patient was showing signs of fluid overload with concern for acute on chronic heart failure.  Patient then presented to [facility] with elevated troponin and was started on heparin gtt but was discontinued due to signs of active bleeding with hemoglobin of 6.9.  Diuretics were held given his worsening kidney function.  Nephrology was consulted.  Past Medical History:  Diagnosis Date  . Acute kidney injury superimposed on chronic kidney disease ([place]) 10/15/2016  . Altered mental state 01/25/2013  . Anemia of chronic disease 01/25/2013  . CAD S/P percutaneous coronary angioplasty    a.not to be a candidate for CABG. On 9/19, went back to cath lab for PCI s/p orbital atherectomy with DES to ostial RCA, and PTCA/DES to distal RCA. Residual 50% prox LAD, 70% D1, CTO mLCx noted by initial diagnostic cath 10/08/16 to be treated medically.  . Cancer ([place])   . Chronic combined systolic and diastolic CHF (congestive heart failure) ([place])   . CKD (chronic kidney  disease), stage IV ([place])   . Diabetes mellitus with renal complications (HCC)    uncontrolled  . Essential hypertension 10/15/2016  . Humerus head fracture 01/27/2013  . Hyperlipidemia   . Hyponatremia 01/25/2013  . Insulin dependent diabetes mellitus ([place])   . Ischemic cardiomyopathy 10/15/2016  . MI (myocardial infarction) ([place])    years ago  . Stroke ([place])   . Transaminitis 01/25/2013  . Uncontrolled diabetes mellitus ([place]) 10/15/2016    Past Surgical History:  Procedure Laterality Date  . CORONARY STENT INTERVENTION N/A 10/13/2016   Procedure: CORONARY STENT INTERVENTION;  Surgeon: Burnell Blanks, MD;  Location: [facility];  Service: Cardiovascular;  Laterality: N/A;  . LEFT HEART CATH AND CORONARY ANGIOGRAPHY N/A 10/08/2016   Procedure: LEFT HEART CATH AND CORONARY ANGIOGRAPHY;  Surgeon: Nelva Bush, MD;  Location: [facility];  Service: Cardiovascular;  Laterality: N/A;  . SKIN CANCER EXCISION    . TEMPORARY PACEMAKER N/A 10/13/2016   Procedure: Temporary Pacemaker;  Surgeon: Burnell Blanks, MD;  Location: [facility];  Service: Cardiovascular;  Laterality: N/A;    Family History  Problem Relation Age of Onset  . Hyperlipidemia Father    Social History:  reports that he has never  smoked. He has never used smokeless tobacco. He reports that he does not drink alcohol or use drugs.  Allergies:  Allergies  Allergen Reactions  . Oxycodone Other (See Comments)    DIFFICULT TO WAKE UP  . Tramadol Nausea And Vomiting  . Lisinopril     Hyperkalemia   . Losartan     Hyperkalemia     Medications Prior to Admission  Medication Sig Dispense Refill  . ALPRAZolam (XANAX) 0.5 MG tablet Take 1 tablet (0.5 mg total) by mouth at bedtime as needed for anxiety. 30 tablet 0  . aspirin EC 81 MG tablet Take 81 mg by mouth daily.    [name] atorvastatin (LIPITOR) 80 MG tablet Take 80 mg by mouth at bedtime.    . bumetanide (BUMEX) 2 MG tablet Take 2 mg by  mouth 2 (two) times daily.    . carvedilol (COREG) 3.125 MG tablet Take 1 tablet (3.125 mg total) by mouth 2 (two) times daily with a meal. 180 tablet 3  . clopidogrel (PLAVIX) 75 MG tablet Take 1 tablet (75 mg total) by mouth daily. 90 tablet 3  . docusate sodium 100 MG CAPS Take 100 mg by mouth 2 (two) times daily. 10 capsule 0  . ferrous sulfate 325 (65 FE) MG tablet Take 325 mg by mouth 2 (two) times daily.    . ferumoxytol 510 mg in sodium chloride 0.9 % 100 mL Inject 510 mg into the vein once.    . gabapentin (NEURONTIN) 300 MG capsule Take 300 mg by mouth 2 (two) times daily.    . insulin aspart protamine - aspart (NOVOLOG 70/30 MIX) (70-30) 100 UNIT/ML FlexPen Inject 95 Units into the skin 2 (two) times daily.    . isosorbide-hydrALAZINE (BIDIL) 20-37.5 MG tablet Take 1 tablet by mouth 2 (two) times daily. 180 tablet 3  . metolazone (ZAROXOLYN) 5 MG tablet Take 5 mg by mouth 3 (three) times a week. Mon, Wed, Fri    . nitroGLYCERIN (NITROSTAT) 0.4 MG SL tablet Place 1 tablet (0.4 mg total) under the tongue every 5 (five) minutes x 3 doses as needed for chest pain. 25 tablet 2  . primidone (MYSOLINE) 50 MG tablet Take 100 mg by mouth daily.    . vitamin B-12 (CYANOCOBALAMIN) 500 MCG tablet Take 1,000 mcg by mouth daily.    . furosemide (LASIX) 80 MG tablet Take 80 MG by mouth TWICE DAY FOR THREE DAYS....THEN 80 MG IN AM 40 MG IN PM - Oral (Patient not taking: Reported on 05/17/2017) 135 tablet 3  . traMADol (ULTRAM) 50 MG tablet Take 2 tablets (100 mg total) by mouth 2 (two) times daily. (Patient not taking: Reported on 05/17/2017) 30 tablet 1    Results for orders placed or performed during the hospital encounter of 05/17/17 (from the past 48 hour(s))  Basic metabolic panel     Status: Abnormal   Collection Time: 05/17/17  3:50 PM  Result Value Ref Range   Sodium 138 135 - 145 mmol/L   Potassium 4.4 3.5 - 5.1 mmol/L   Chloride 104 101 - 111 mmol/L   CO2 15 (L) 22 - 32 mmol/L   Glucose,  Bld 338 (H) 65 - 99 mg/dL   BUN 127 (H) 6 - 20 mg/dL   Creatinine, Ser 4.48 (H) 0.61 - 1.24 mg/dL   Calcium 8.1 (L) 8.9 - 10.3 mg/dL   GFR calc non Af Amer 12 (L) >60 mL/min   GFR calc Af Amer 14 (L) >60 mL/min  Comment: (NOTE) The eGFR has been calculated using the CKD EPI equation. This calculation has not been validated in all clinical situations. eGFR's persistently <60 mL/min signify possible Chronic Kidney Disease.    Anion gap 19 (H) 5 - 15    Comment: Performed at [facility], [place] [address], [place], [place] [address]  CBC     Status: Abnormal   Collection Time: 05/17/17  3:50 PM  Result Value Ref Range   WBC 9.5 4.0 - 10.5 K/uL   RBC 2.66 (L) 4.22 - 5.81 MIL/uL   Hemoglobin 7.9 (L) 13.0 - 17.0 g/dL   HCT 25.0 (L) 39.0 - 52.0 %   MCV 94.0 78.0 - 100.0 fL   MCH 29.7 26.0 - 34.0 pg   MCHC 31.6 30.0 - 36.0 g/dL   RDW 15.1 11.5 - 15.5 %   Platelets 244 150 - 400 K/uL    Comment: Performed at [facility], [place] [address] [place], [place] [address]  Brain natriuretic peptide     Status: Abnormal   Collection Time: 05/17/17  3:50 PM  Result Value Ref Range   B Natriuretic Peptide 754.3 (H) 0.0 - 100.0 pg/mL    Comment: Performed at [facility] [address], [place], [place] [address]  I-stat troponin, ED     Status: Abnormal   Collection Time: 05/17/17  4:42 PM  Result Value Ref Range   Troponin i, poc 1.33 (HH) 0.00 - 0.08 ng/mL   Comment NOTIFIED PHYSICIAN    Comment 3            Comment: Due to the release kinetics of cTnI, a negative result within the first hours of the onset of symptoms does not rule out myocardial infarction with certainty. If myocardial infarction is still suspected, repeat the test at appropriate intervals.   Troponin I     Status: Abnormal   Collection Time: 05/17/17  8:52 PM  Result Value Ref Range   Troponin I 0.97 (HH) <0.03 ng/mL    Comment: CRITICAL RESULT CALLED TO, READ BACK BY AND VERIFIED WITH: MORRIS,T  RN 05/17/2017 2151 JORDANS Performed at [facility], [place] [address] [place], [place] [address]   Urinalysis, Routine w reflex microscopic     Status: Abnormal   Collection Time: 05/17/17 11:32 PM  Result Value Ref Range   Color, Urine STRAW (A) YELLOW   APPearance CLEAR CLEAR   Specific Gravity, Urine 1.010 1.005 - 1.030   pH 5.0 5.0 - 8.0   Glucose, UA 50 (A) NEGATIVE mg/dL   Hgb urine dipstick SMALL (A) NEGATIVE   Bilirubin Urine NEGATIVE NEGATIVE   Ketones, ur NEGATIVE NEGATIVE mg/dL   Protein, ur 100 (A) NEGATIVE mg/dL   Nitrite NEGATIVE NEGATIVE   Leukocytes, UA NEGATIVE NEGATIVE   RBC / HPF 0-5 0 - 5 RBC/hpf   WBC, UA 0-5 0 - 5 WBC/hpf   Bacteria, UA RARE (A) NONE SEEN   Squamous Epithelial / LPF 0-5 0 - 5    Comment: Please note change in reference range. Performed at [facility], [place] [address] [place], [place] [address]   CBG monitoring, ED     Status: Abnormal   Collection Time: 05/17/17 11:40 PM  Result Value Ref Range   Glucose-Capillary 230 (H) 65 - 99 mg/dL  Basic metabolic panel     Status: Abnormal   Collection Time: 05/18/17  2:54 AM  Result Value Ref Range   Sodium  135 135 - 145 mmol/L   Potassium 3.9 3.5 - 5.1 mmol/L   Chloride 102 101 - 111 mmol/L   CO2 16 (L) 22 - 32 mmol/L   Glucose, Bld 320 (H) 65 - 99 mg/dL   BUN 133 (H) 6 - 20 mg/dL   Creatinine, Ser 4.49 (H) 0.61 - 1.24 mg/dL   Calcium 7.8 (L) 8.9 - 10.3 mg/dL   GFR calc non Af Amer 12 (L) >60 mL/min   GFR calc Af Amer 14 (L) >60 mL/min    Comment: (NOTE) The eGFR has been calculated using the CKD EPI equation. This calculation has not been validated in all clinical situations. eGFR's persistently <60 mL/min signify possible Chronic Kidney Disease.    Anion gap 17 (H) 5 - 15    Comment: Performed at [facility], [place] [address], [place], [place] [address]  TSH     Status: None   Collection Time: 05/18/17  2:54 AM  Result Value Ref Range   TSH 0.722 0.350 - 4.500  uIU/mL    Comment: Performed by a 3rd Generation assay with a functional sensitivity of <=0.01 uIU/mL. Performed at [facility], [place] [address], [place], [place] [address]   CBC WITH DIFFERENTIAL     Status: Abnormal   Collection Time: 05/18/17  2:54 AM  Result Value Ref Range   WBC 10.0 4.0 - 10.5 K/uL   RBC 2.34 (L) 4.22 - 5.81 MIL/uL   Hemoglobin 6.9 (LL) 13.0 - 17.0 g/dL    Comment: REPEATED TO VERIFY CRITICAL RESULT CALLED TO, READ BACK BY AND VERIFIED WITH: M.BERNARDO,RN [id number] 05/18/17 G.MCADOO    HCT 21.7 (L) 39.0 - 52.0 %   MCV 92.7 78.0 - 100.0 fL   MCH 29.5 26.0 - 34.0 pg   MCHC 31.8 30.0 - 36.0 g/dL   RDW 15.0 11.5 - 15.5 %   Platelets 246 150 - 400 K/uL   Neutrophils Relative % 74 %   Neutro Abs 7.4 1.7 - 7.7 K/uL   Lymphocytes Relative 9 %   Lymphs Abs 0.9 0.7 - 4.0 K/uL   Monocytes Relative 15 %   Monocytes Absolute 1.5 (H) 0.1 - 1.0 K/uL   Eosinophils Relative 2 %   Eosinophils Absolute 0.2 0.0 - 0.7 K/uL   Basophils Relative 0 %   Basophils Absolute 0.0 0.0 - 0.1 K/uL    Comment: Performed at [facility] [address] [place], [place] [address]  Troponin I     Status: Abnormal   Collection Time: 05/18/17  2:54 AM  Result Value Ref Range   Troponin I 1.05 (HH) <0.03 ng/mL    Comment: CRITICAL VALUE NOTED.  VALUE IS CONSISTENT WITH PREVIOUSLY REPORTED AND CALLED VALUE. Performed at [facility], [place] [address] [place], [place] [address]   Type and screen [place]     Status: None (Preliminary result)   Collection Time: 05/18/17  4:00 AM  Result Value Ref Range   ABO/RH(D) A POS    Antibody Screen NEG    Sample Expiration 05/21/2017    Unit Number [id number]    Blood Component Type RED CELLS,LR    Unit division 00    Status of Unit ISSUED    Transfusion Status OK TO TRANSFUSE    Crossmatch Result      Compatible Performed at [facility], [place] [address], [place],  [address]    Unit Number  [id number]  Blood Component Type RED CELLS,LR    Unit division 00    Status of Unit ALLOCATED    Transfusion Status OK TO TRANSFUSE    Crossmatch Result Compatible    Unit Number [id number]    Blood Component Type RED CELLS,LR    Unit division 00    Status of Unit ALLOCATED    Transfusion Status OK TO TRANSFUSE    Crossmatch Result Compatible   Prepare RBC     Status: None   Collection Time: 05/18/17  4:00 AM  Result Value Ref Range   Order Confirmation      ORDER PROCESSED BY BLOOD BANK Performed at [facility], [place] [address] [place], [place] [address]   ABO/Rh     Status: None   Collection Time: 05/18/17  4:00 AM  Result Value Ref Range   ABO/RH(D)      A POS Performed at [facility] [address] [place], [place] [address]   POC occult blood, ED     Status: Abnormal   Collection Time: 05/18/17  4:45 AM  Result Value Ref Range   Fecal Occult Bld POSITIVE (A) NEGATIVE  Glucose, capillary     Status: Abnormal   Collection Time: 05/18/17  6:33 AM  Result Value Ref Range   Glucose-Capillary 259 (H) 65 - 99 mg/dL   Dg Chest 2 View  Result Date: 05/17/2017 CLINICAL DATA:  Chest pain.  Renal failure. EXAM: CHEST - 2 VIEW COMPARISON:  October 04, 2016 FINDINGS: There is no edema or consolidation. Heart is mildly enlarged with pulmonary vascularity within normal limits. No adenopathy. There is aortic atherosclerosis. There is an old healed fracture of the proximal right humerus, nonunited. IMPRESSION: Cardiomegaly. No edema or consolidation. There is aortic atherosclerosis. There is a nonunited old fracture of the right proximal humerus. Aortic Atherosclerosis (ICD10-I70.0). Electronically Signed   By: Lowella Grip III M.D.   On: 05/17/2017 14:42    Review of Systems  Constitutional: Negative for chills, fever and malaise/fatigue.  Respiratory: Positive for shortness of breath. Negative for cough and wheezing.   Cardiovascular: Positive for leg  swelling. Negative for chest pain.  Gastrointestinal: Negative for abdominal pain, blood in stool, melena, nausea and vomiting.  Genitourinary: Negative for dysuria and hematuria.  Musculoskeletal: Negative for myalgias and neck pain.  Neurological: Negative for weakness and headaches.    Blood pressure (!) 142/78, pulse 92, temperature 98.1 F (36.7 C), temperature source Oral, resp. rate 17, height 6' (1.829 m), weight 237 lb 7 oz (107.7 kg), SpO2 95 %. Physical Exam  Constitutional: He is oriented to person, place, and time. He appears well-developed and well-nourished. No distress.  HENT:  Head: Normocephalic and atraumatic.  Mouth/Throat: Oropharynx is clear and moist.  Eyes: Pupils are equal, round, and reactive to light. EOM are normal.  Neck: Normal range of motion. Neck supple. No JVD present.  Cardiovascular: Normal rate and regular rhythm. Exam reveals no gallop and no friction rub.  No murmur heard. Respiratory: Effort normal and breath sounds normal. No respiratory distress. He has no wheezes. He has no rales.  GI: Soft. Bowel sounds are normal. There is no tenderness.  Musculoskeletal: Normal range of motion. He exhibits edema.  Neurological: He is alert and oriented to person, place, and time.  Skin: Skin is warm and dry.     Assessment/Plan 1.  AoC combined systolic and diastolic HF  ICM  CAD: Secondary to fluid overload d/t worsening kidney function.  We will continue with diuresis plan per below followed by HD.  Currently on Plavix.  Cardiology following.  2.  AKI on CKD IV: Now ESRD.  Has 20-week-old R-arm fistula.  Will need temporary access. Plan to consult IR for access.  Will diurese today with Lasix 80 mg IV Q8H.  Anticipate HD on 4/25.  I suspect patient will need lifelong HD at this point.  Hopefully this will improve HF.  Nephrology will follow.  3.  Anion gap metabolic acidosis: Improving.  AG 17.  Likely related to new onset ESRD.  4.  Acute blood loss  anemia on anemia of CKD: Hgb 6.9 4/24. S/p x1 pRBC.  On Plavix.  S/p heparin GTT which was d/c d/t active bleeding. Now hemodynamically stable. FOBT positive.  Checking iron studies. Monitor Hgb.  5.  HTN: Normotensive. S/p x1 unit pRBC.  On BiDil, Coreg, and Lasix.  6.  T2DM: CBG elevated in 200-300s. On insulin. Per primary.    Harriet Butte, DO, PGY-2

## 2017-05-19 ENCOUNTER — Inpatient Hospital Stay (HOSPITAL_COMMUNITY): Payer: Non-veteran care

## 2017-05-19 ENCOUNTER — Encounter (HOSPITAL_COMMUNITY): Payer: Self-pay | Admitting: Interventional Radiology

## 2017-05-19 HISTORY — PX: IR US GUIDE VASC ACCESS RIGHT: IMG2390

## 2017-05-19 HISTORY — PX: IR FLUORO GUIDE CV LINE RIGHT: IMG2283

## 2017-05-19 LAB — RENAL FUNCTION PANEL
ANION GAP: 16 — AB (ref 5–15)
Albumin: 2.6 g/dL — ABNORMAL LOW (ref 3.5–5.0)
BUN: 132 mg/dL — ABNORMAL HIGH (ref 6–20)
CO2: 19 mmol/L — ABNORMAL LOW (ref 22–32)
Calcium: 8.1 mg/dL — ABNORMAL LOW (ref 8.9–10.3)
Chloride: 103 mmol/L (ref 101–111)
Creatinine, Ser: 4.44 mg/dL — ABNORMAL HIGH (ref 0.61–1.24)
GFR calc Af Amer: 14 mL/min — ABNORMAL LOW (ref >60)
GFR, EST NON AFRICAN AMERICAN: 12 mL/min — AB (ref >60)
GLUCOSE: 233 mg/dL — AB (ref 65–99)
POTASSIUM: 3.8 mmol/L (ref 3.5–5.1)
Phosphorus: 10.6 mg/dL — ABNORMAL HIGH (ref 2.5–4.6)
Sodium: 138 mmol/L (ref 135–145)

## 2017-05-19 LAB — GLUCOSE, CAPILLARY
GLUCOSE-CAPILLARY: 131 mg/dL — AB (ref 65–99)
GLUCOSE-CAPILLARY: 206 mg/dL — AB (ref 65–99)
Glucose-Capillary: 176 mg/dL — ABNORMAL HIGH (ref 65–99)
Glucose-Capillary: 144 mg/dL — ABNORMAL HIGH (ref 65–99)

## 2017-05-19 LAB — CBC
HCT: 24.3 % — ABNORMAL LOW (ref 39.0–52.0)
Hemoglobin: 7.6 g/dL — ABNORMAL LOW (ref 13.0–17.0)
MCH: 28.9 pg (ref 26.0–34.0)
MCHC: 31.3 g/dL (ref 30.0–36.0)
MCV: 92.4 fL (ref 78.0–100.0)
Platelets: 243 K/uL (ref 150–400)
RBC: 2.63 MIL/uL — ABNORMAL LOW (ref 4.22–5.81)
RDW: 15.3 % (ref 11.5–15.5)
WBC: 11 K/uL — ABNORMAL HIGH (ref 4.0–10.5)

## 2017-05-19 LAB — PROTIME-INR
INR: 1.2
Prothrombin Time: 15.1 seconds (ref 11.4–15.2)

## 2017-05-19 LAB — IRON AND TIBC
Iron: 161 ug/dL (ref 45–182)
Saturation Ratios: 79 % — ABNORMAL HIGH (ref 17.9–39.5)
TIBC: 203 ug/dL — ABNORMAL LOW (ref 250–450)
UIBC: 42 ug/dL

## 2017-05-19 LAB — FERRITIN: Ferritin: 150 ng/mL (ref 24–336)

## 2017-05-19 MED ORDER — CEFAZOLIN SODIUM-DEXTROSE 2-4 GM/100ML-% IV SOLN
2.0000 g | INTRAVENOUS | Status: AC
  Administered 2017-05-19: 2 g via INTRAVENOUS

## 2017-05-19 MED ORDER — SODIUM CHLORIDE 0.9 % IV SOLN: 100.0000 mL | INTRAVENOUS | Status: DC | PRN

## 2017-05-19 MED ORDER — ONDANSETRON HCL 4 MG/2ML IJ SOLN
INTRAMUSCULAR | Status: AC
  Filled 2017-05-19: qty 2

## 2017-05-19 MED ORDER — LIDOCAINE HCL (PF) 1 % IJ SOLN: 5.0000 mL | INTRAMUSCULAR | Status: DC | PRN

## 2017-05-19 MED ORDER — DARBEPOETIN ALFA 40 MCG/0.4ML IJ SOSY
40.0000 ug | PREFILLED_SYRINGE | INTRAMUSCULAR | Status: DC
  Administered 2017-05-20: 40 ug via INTRAVENOUS
  Filled 2017-05-19: qty 0.4

## 2017-05-19 MED ORDER — FENTANYL CITRATE (PF) 100 MCG/2ML IJ SOLN
INTRAMUSCULAR | Status: AC | PRN
  Administered 2017-05-19: 25 ug via INTRAVENOUS

## 2017-05-19 MED ORDER — FENTANYL CITRATE (PF) 100 MCG/2ML IJ SOLN
INTRAMUSCULAR | Status: AC
  Filled 2017-05-19: qty 2

## 2017-05-19 MED ORDER — PENTAFLUOROPROP-TETRAFLUOROETH EX AERO: 1.0000 "application " | INHALATION_SPRAY | CUTANEOUS | Status: DC | PRN

## 2017-05-19 MED ORDER — LIDOCAINE HCL 1 % IJ SOLN
INTRAMUSCULAR | Status: AC
  Filled 2017-05-19: qty 20

## 2017-05-19 MED ORDER — ALTEPLASE 2 MG IJ SOLR: 2.0000 mg | Freq: Once | INTRAMUSCULAR | Status: DC | PRN

## 2017-05-19 MED ORDER — SODIUM CHLORIDE 0.9 % IV SOLN
INTRAVENOUS | Status: AC | PRN
  Administered 2017-05-19: 10 mL/h via INTRAVENOUS

## 2017-05-19 MED ORDER — MIDAZOLAM HCL 2 MG/2ML IJ SOLN
INTRAMUSCULAR | Status: AC
  Filled 2017-05-19: qty 2

## 2017-05-19 MED ORDER — LIDOCAINE HCL (PF) 1 % IJ SOLN
INTRAMUSCULAR | Status: AC | PRN
  Administered 2017-05-19: 5 mL

## 2017-05-19 MED ORDER — BENZONATATE 100 MG PO CAPS
100.0000 mg | ORAL_CAPSULE | Freq: Two times a day (BID) | ORAL | Status: DC | PRN
  Administered 2017-05-22: 100 mg via ORAL
  Filled 2017-05-19: qty 1

## 2017-05-19 MED ORDER — LIDOCAINE-PRILOCAINE 2.5-2.5 % EX CREA
1.0000 "application " | TOPICAL_CREAM | CUTANEOUS | Status: DC | PRN
  Filled 2017-05-19: qty 5

## 2017-05-19 MED ORDER — LIDOCAINE-PRILOCAINE 2.5-2.5 % EX CREA: 1.0000 "application " | TOPICAL_CREAM | CUTANEOUS | Status: DC | PRN

## 2017-05-19 MED ORDER — CEFAZOLIN SODIUM-DEXTROSE 2-4 GM/100ML-% IV SOLN
INTRAVENOUS | Status: AC
  Filled 2017-05-19: qty 100

## 2017-05-19 MED ORDER — HEPARIN SODIUM (PORCINE) 1000 UNIT/ML IJ SOLN
INTRAMUSCULAR | Status: AC
  Administered 2017-05-19: 3.2 mL
  Filled 2017-05-19: qty 1

## 2017-05-19 MED ORDER — HEPARIN SODIUM (PORCINE) 1000 UNIT/ML DIALYSIS
1000.0000 [IU] | INTRAMUSCULAR | Status: DC | PRN
  Filled 2017-05-19: qty 1

## 2017-05-19 MED ORDER — FUROSEMIDE 10 MG/ML IJ SOLN: 80.0000 mg | Freq: Every day | INTRAMUSCULAR | Status: DC

## 2017-05-19 MED ORDER — GUAIFENESIN ER 600 MG PO TB12
600.0000 mg | ORAL_TABLET | Freq: Two times a day (BID) | ORAL | Status: DC
  Administered 2017-05-19 – 2017-05-24 (×10): 600 mg via ORAL
  Filled 2017-05-19 (×10): qty 1

## 2017-05-19 MED ORDER — CALCIUM ACETATE (PHOS BINDER) 667 MG PO CAPS
1334.0000 mg | ORAL_CAPSULE | Freq: Three times a day (TID) | ORAL | Status: DC
  Administered 2017-05-19 – 2017-05-24 (×13): 1334 mg via ORAL
  Filled 2017-05-19 (×14): qty 2

## 2017-05-19 MED ORDER — HEPARIN SODIUM (PORCINE) 1000 UNIT/ML DIALYSIS: 1000.0000 [IU] | INTRAMUSCULAR | Status: DC | PRN

## 2017-05-19 NOTE — Sedation Documentation (Signed)
Patient is resting comfortably. 

## 2017-05-19 NOTE — Progress Notes (Signed)
Mitchell Garcia Progress Note    Assessment/ Plan:   1.  AKI on CKD 4: Has progressed to ESRD.  Creatinine slightly improved, 4.44.  BUN 132.  Has a 23-week-old right arm fistula though not mature.  Plan for tunneled dialysis catheter per IR later today.  UOP -1.35 L.  Continue diuresing with Lasix 80 mg IV every 8 hours.  Suspect heart failure exacerbation secondary to worsening kidney disease patient improved with dialysis.  Anticipate lifelong dialysis at this point.  2.  Acute on chronic combined heart failure  ICM  CAD: Secondary to fluid overload state due to worsening kidney function.  Diuresis per above.  Currently on Plavix.  Cardiology following.  3.  Anion gap metabolic acidosis: Improving.  AG 16.  CO2 19.  Likely related to new onset ESRD.  4.  Acute blood loss anemia on anemia of CKD: S/p x1 pRBC 4/24 following Hgb 6.9.  Stable at 7.6.  Transfusions threshold <7.  No signs of active bleeding.  Hemodynamically stable.  FOBT positive.  5.  HTN: Normotensive.  Antihypertensives include BiDil, Coreg, Lasix.  6.  T2DM: CBGs improving, now in 200s.  On insulin.  Per primary.  Subjective:   Patient continues to have some cough.  Denies shortness of breath or chest pain.    Objective:   BP (!) 126/101 (BP Location: Left Arm)   Pulse 80   Temp 98.1 F (36.7 C) (Oral)   Resp (!) 22   Ht 6' (1.829 m)   Wt 237 lb 7 oz (107.7 kg)   SpO2 90%   BMI 32.20 kg/m   Intake/Output Summary (Last 24 hours) at 05/19/2017 0802 Last data filed at 05/19/2017 7322 Gross per 24 hour  Intake 1140 ml  Output 1350 ml  Net -210 ml   Weight change: 7 lb 7 oz (3.373 kg)  Physical Exam: General: obese, well developed, NAD with non-toxic appearance HEENT: normocephalic, atraumatic, moist mucous membranes Neck: supple, non-tender without lymphadenopathy, no JVD Cardiovascular: regular rate and rhythm without murmurs, rubs, or gallops Lungs: bibasilar crackles with normal work of  breathing on room air Abdomen: soft, non-tender, non-distended, normoactive bowel sounds Skin: warm, dry, no rashes or lesions, cap refill < 2 seconds Extremities: warm and well perfused, normal tone, improved 1+ pitting LE edema bilaterally up to knees  Imaging: Dg Chest 2 View  Result Date: 05/17/2017 CLINICAL DATA:  Chest pain.  Renal failure. EXAM: CHEST - 2 VIEW COMPARISON:  October 04, 2016 FINDINGS: There is no edema or consolidation. Heart is mildly enlarged with pulmonary vascularity within normal limits. No adenopathy. There is aortic atherosclerosis. There is an old healed fracture of the proximal right humerus, nonunited. IMPRESSION: Cardiomegaly. No edema or consolidation. There is aortic atherosclerosis. There is a nonunited old fracture of the right proximal humerus. Aortic Atherosclerosis (ICD10-I70.0). Electronically Signed   By: Lowella Grip III M.D.   On: 05/17/2017 14:42    Labs: BMET Recent Labs  Lab 05/17/17 1550 05/18/17 0254 05/19/17 0230  NA 138 135 138  K 4.4 3.9 3.8  CL 104 102 103  CO2 15* 16* 19*  GLUCOSE 338* 320* 233*  BUN 127* 133* 132*  CREATININE 4.48* 4.49* 4.44*  CALCIUM 8.1* 7.8* 8.1*  PHOS  --   --  10.6*   CBC Recent Labs  Lab 05/17/17 1550 05/18/17 0254 05/18/17 1441 05/19/17 0230  WBC 9.5 10.0 9.8 11.0*  NEUTROABS  --  7.4 7.2  --   HGB 7.9*  6.9* 7.9* 7.6*  HCT 25.0* 21.7* 24.9* 24.3*  MCV 94.0 92.7 93.3 92.4  PLT 244 246 238 243    Medications:    . aspirin EC  81 mg Oral Daily  . atorvastatin  80 mg Oral QHS  . carvedilol  3.125 mg Oral BID WC  . clopidogrel  75 mg Oral Daily  . docusate sodium  100 mg Oral BID  . ferrous sulfate  325 mg Oral BID WC  . furosemide  80 mg Intravenous Q8H  . gabapentin  300 mg Oral QHS  . insulin aspart  0-15 Units Subcutaneous TID WC  . insulin detemir  20 Units Subcutaneous BID  . isosorbide-hydrALAZINE  1 tablet Oral BID  . sodium chloride flush  3 mL Intravenous Q12H      Harriet Butte, DO, PGY-2

## 2017-05-19 NOTE — Care Management Note (Signed)
Case Management Note [name] RN, BSN Unit 4E-Case Manager [phone]  Patient Details  Name: KERIN CECCHI MRN: [id number] Date of Birth: 13-Oct-1945  Subjective/Objective:  Pt admitted with acute on Chronic HF- plan to start HD tx for vol. Overload- s/p tunneled HD cath- first HD today 4/25                Action/Plan: PTA pt lived at home with spouse- Notified by April at [facility]- pt is established at the [facility] - PCP is Dr. Romero Liner, [place] contact for PCP is Drue Stager- [phone] ext. [phone] or pager [phone] CM to follow for transition of care needs.   Expected Discharge Date:                  Expected Discharge Plan:  Home/Self Care  In-House Referral:     Discharge planning Services  CM Consult  Post Acute Care Choice:    Choice offered to:     DME Arranged:    DME Agency:     HH Arranged:    HH Agency:     Status of Service:  In process, will continue to follow  If discussed at Long Length of Stay Meetings, dates discussed:    Discharge Disposition:   Additional Comments:  Dawayne Patricia, RN 05/19/2017, 2:37 PM

## 2017-05-19 NOTE — Procedures (Signed)
  Procedure: R IJ 19 Palindrome tunneled HD catheter to svc/ra jct EBL:   minimal Complications:  none immediate  See full dictation in [facility].  Dillard Cannon MD Main # [phone] Pager  [phone]

## 2017-05-19 NOTE — Procedures (Signed)
I was present at this session.  I have reviewed the session itself and made appropriate changes.  Will use low flows, short time , avoid dysequilib.  bp tol HD thus far.    Jeneen Rinks Job [place] 4/25/20192:32 PM

## 2017-05-19 NOTE — Progress Notes (Signed)
Inpatient Diabetes Program Recommendations  AACE/ADA: New Consensus Statement on Inpatient Glycemic Control (2015)  Target Ranges:  Prepandial:   less than 140 mg/dL      Peak postprandial:   less than 180 mg/dL (1-2 hours)      Critically ill patients:  140 - 180 mg/dL   Results for Mitchell Garcia, Mitchell Garcia (MRN [id number]) as of 05/19/2017 09:14  Ref. Range 05/18/2017 06:33 05/18/2017 11:18 05/18/2017 16:07 05/18/2017 21:26 05/19/2017 06:22  Glucose-Capillary Latest Ref Range: 65 - 99 mg/dL 259 (H) 301 (H) 289 (H) 335 (H)  Levemir 20 units @22 :00 176 (H)    Review of Glycemic Control  Diabetes history: DM2 Outpatient Diabetes medications: 70/30 95 units BID Current orders for Inpatient glycemic control: Levemir 20 mg BID, Novolog 0-15 units TID with meals  Inpatient Diabetes Program Recommendations: Insulin - Basal: Noted Levemir 20 units BID ordered on 05/18/17 and patient received one dose of Levemir 20 units last night and fasting glucose 176 mg/dl today. Patient should receive Levemir twice today as ordered. Correction (SSI): Noted Novolog 0-15 units TID ordered today. Since patient has ESRD and will get Levemir BID today; please consider decreasing Novolog correction to sensitive scale 0-9 units TID with meals and 0-5 units QHS. Outpatient DM regimen: Patient is interested in switching from 70/30 insulin to Levemir (or Lantus) and Novolog regimen (similar to inpatient regimen if glucose is well controlled).   NOTE: Diabetes Coordinator spoke with patient and his wife at length on 05/18/17 (see note for details). Patient rarely takes 70/30 as prescribed and has experienced severe hypoglycemia a few months ago so is fearful of going low. Patient has been on Lantus and Novolog regimen in the past and is interested in switching to basal/bolus regimen so doses can be adjusted per meal and daily routine.  Thanks, Barnie Alderman, RN, MSN, CDE Diabetes Coordinator Inpatient Diabetes Program [phone]  (Team Pager from 8am to 5pm)

## 2017-05-19 NOTE — Progress Notes (Signed)
[facility] PROGRESS NOTE  Mitchell Garcia YNW:[id number] DOB: August 11, 1945 DOA: 05/17/2017 PCP: [name], MD  Assessment/Plan:  CHF exacerbation HD today Strict I's and O's Echo checked HD catheter placed this admit 1500cc Fluid restriction Cont Bidil  Anxiety Continue Xanax  Hyperlipidemia Continue Lipitor  Coronary artery disease Continue Plavix and aspirin .  Nitroglycerin sublingual  Chronic pain Continue Neurontin  Code Status: FC  DVT Prophylaxis: SCDs Family Communication: none available Disposition Plan: Pending Improvement  Status: inpt sdu  Elwin Mocha, MD Family Medicine [facility] [email or web address] Password [email or web address]   Consultants:  IR, cardio, nephro  Procedures:  HD cath pending  Antibiotics:  n/a (indicate start date, and stop date if known)  HPI/Subjective: Anxious about procedure. Otherwise well.  Objective: Vitals:   05/19/17 1610 05/19/17 1647  BP: 118/64 140/81  Pulse: 88 88  Resp: (!) 24 (!) 24  Temp: (!) 97.5 F (36.4 C) 97.8 F (36.6 C)  SpO2: 99% 94%    Intake/Output Summary (Last 24 hours) at 05/19/2017 1842 Last data filed at 05/19/2017 1651 Gross per 24 hour  Intake 720 ml  Output 2890 ml  Net -2170 ml   Filed Weights   05/19/17 0314 05/19/17 1324 05/19/17 1610  Weight: 107.7 kg (237 lb 7 oz) 107.1 kg (236 lb 1.8 oz) 105.1 kg (231 lb 11.3 oz)    Exam:  General: NCAT, NAD Cardiovascular: RRR, no MRG Respiratory: CTAB, nl wob Abdomen: NS, BS+, NTTP Musculoskeletal: moving all extr, nl tone   Data Reviewed: Basic Metabolic Panel: Recent Labs  Lab 05/17/17 1550 05/18/17 0254 05/19/17 0230  NA 138 135 138  K 4.4 3.9 3.8  CL 104 102 103  CO2 15* 16* 19*  GLUCOSE 338* 320* 233*  BUN 127* 133* 132*  CREATININE 4.48* 4.49* 4.44*  CALCIUM 8.1* 7.8* 8.1*  PHOS  --   --  10.6*   Liver Function Tests: Recent Labs  Lab 05/19/17 0230  ALBUMIN 2.6*   No results for input(s):  LIPASE, AMYLASE in the last 168 hours. No results for input(s): AMMONIA in the last 168 hours. CBC: Recent Labs  Lab 05/17/17 1550 05/18/17 0254 05/18/17 1441 05/19/17 0230  WBC 9.5 10.0 9.8 11.0*  NEUTROABS  --  7.4 7.2  --   HGB 7.9* 6.9* 7.9* 7.6*  HCT 25.0* 21.7* 24.9* 24.3*  MCV 94.0 92.7 93.3 92.4  PLT 244 246 238 243   Cardiac Enzymes: Recent Labs  Lab 05/17/17 2052 05/18/17 0254 05/18/17 0903  TROPONINI 0.97* 1.05* 0.86*   BNP (last 3 results) Recent Labs    10/04/16 1210 10/09/16 0346 05/17/17 1550  BNP 943.6* 546.7* 754.3*    ProBNP (last 3 results) Recent Labs    10/25/16 1157  PROBNP 10,341*    CBG: Recent Labs  Lab 05/18/17 1607 05/18/17 2126 05/19/17 0622 05/19/17 1104 05/19/17 1643  GLUCAP 289* 335* 176* 144* 131*    No results found for this or any previous visit (from the past 240 hour(s)).   Studies: Ir Fluoro Guide Cv Line Right  Result Date: 05/19/2017 CLINICAL DATA:  Renal failure. Maturing fistula. Needs access for hemodialysis. EXAM: TUNNELED HEMODIALYSIS CATHETER PLACEMENT WITH ULTRASOUND AND FLUOROSCOPIC GUIDANCE TECHNIQUE: The procedure, risks, benefits, and alternatives were explained to the patient. Questions regarding the procedure were encouraged and answered. The patient understands and consents to the procedure. As antibiotic prophylaxis, cefazolin 2 g was ordered pre-procedure and administered intravenously within one hour of incision.Patency of the right IJ vein was  confirmed with ultrasound with image documentation. An appropriate skin site was determined. Region was prepped using maximum barrier technique including cap and mask, sterile gown, sterile gloves, large sterile sheet, and Chlorhexidine as cutaneous antisepsis. The region was infiltrated locally with 1% lidocaine. Intravenous Fentanyl and Versed were administered as conscious sedation during continuous monitoring of the patient's level of consciousness and  physiological / cardiorespiratory status by the radiology RN, with a total moderate sedation time of 13 minutes. Under real-time ultrasound guidance, the right IJ vein was accessed with a 21 gauge micropuncture needle; the needle tip within the vein was confirmed with ultrasound image documentation. Needle exchanged over the 018 guidewire for transitional dilator, which allowed advancement of a Benson wire into the IVC. Over this, an MPA catheter was advanced. A Palindrome 19 hemodialysis catheter was tunneled from the right anterior chest wall approach to the right IJ dermatotomy site. The MPA catheter was exchanged over an Amplatz wire for serial vascular dilators which allow placement of a peel-away sheath, through which the catheter was advanced under intermittent fluoroscopy, positioned with its tips in the proximal and midright atrium. Spot chest radiograph confirms good catheter position. No pneumothorax. Catheter was flushed and primed per protocol. Catheter secured externally with O Prolene sutures. The right IJ dermatotomy site was closed with Dermabond. COMPLICATIONS: COMPLICATIONS None immediate FLUOROSCOPY TIME:  30 seconds; 8 mGy COMPARISON:  None IMPRESSION: 1. Technically successful placement of tunneled right IJ hemodialysis catheter with ultrasound and fluoroscopic guidance. Ready for routine use. Electronically Signed   By: Lucrezia Europe M.D.   On: 05/19/2017 10:12   Ir US Guide Vasc Access Right  Result Date: 05/19/2017 CLINICAL DATA:  Renal failure. Maturing fistula. Needs access for hemodialysis. EXAM: TUNNELED HEMODIALYSIS CATHETER PLACEMENT WITH ULTRASOUND AND FLUOROSCOPIC GUIDANCE TECHNIQUE: The procedure, risks, benefits, and alternatives were explained to the patient. Questions regarding the procedure were encouraged and answered. The patient understands and consents to the procedure. As antibiotic prophylaxis, cefazolin 2 g was ordered pre-procedure and administered intravenously within  one hour of incision.Patency of the right IJ vein was confirmed with ultrasound with image documentation. An appropriate skin site was determined. Region was prepped using maximum barrier technique including cap and mask, sterile gown, sterile gloves, large sterile sheet, and Chlorhexidine as cutaneous antisepsis. The region was infiltrated locally with 1% lidocaine. Intravenous Fentanyl and Versed were administered as conscious sedation during continuous monitoring of the patient's level of consciousness and physiological / cardiorespiratory status by the radiology RN, with a total moderate sedation time of 13 minutes. Under real-time ultrasound guidance, the right IJ vein was accessed with a 21 gauge micropuncture needle; the needle tip within the vein was confirmed with ultrasound image documentation. Needle exchanged over the 018 guidewire for transitional dilator, which allowed advancement of a Benson wire into the IVC. Over this, an MPA catheter was advanced. A Palindrome 19 hemodialysis catheter was tunneled from the right anterior chest wall approach to the right IJ dermatotomy site. The MPA catheter was exchanged over an Amplatz wire for serial vascular dilators which allow placement of a peel-away sheath, through which the catheter was advanced under intermittent fluoroscopy, positioned with its tips in the proximal and midright atrium. Spot chest radiograph confirms good catheter position. No pneumothorax. Catheter was flushed and primed per protocol. Catheter secured externally with O Prolene sutures. The right IJ dermatotomy site was closed with Dermabond. COMPLICATIONS: COMPLICATIONS None immediate FLUOROSCOPY TIME:  30 seconds; 8 mGy COMPARISON:  None IMPRESSION: 1. Technically successful  placement of tunneled right IJ hemodialysis catheter with ultrasound and fluoroscopic guidance. Ready for routine use. Electronically Signed   By: Lucrezia Europe M.D.   On: 05/19/2017 10:12    Scheduled Meds: .  aspirin EC  81 mg Oral Daily  . atorvastatin  80 mg Oral QHS  . calcium acetate  1,334 mg Oral TID WC  . carvedilol  3.125 mg Oral BID WC  . clopidogrel  75 mg Oral Daily  . darbepoetin (ARANESP) injection - DIALYSIS  40 mcg Intravenous Q Thu-HD  . docusate sodium  100 mg Oral BID  . fentaNYL      . ferrous sulfate  325 mg Oral BID WC  . gabapentin  300 mg Oral QHS  . insulin aspart  0-15 Units Subcutaneous TID WC  . insulin detemir  20 Units Subcutaneous BID  . isosorbide-hydrALAZINE  1 tablet Oral BID  . lidocaine      . sodium chloride flush  3 mL Intravenous Q12H   Continuous Infusions: . sodium chloride    . sodium chloride    . sodium chloride    . sodium chloride    . sodium chloride    . ceFAZolin      Principal Problem:   Acute on chronic combined systolic and diastolic CHF (congestive heart failure) (HCC) Active Problems:   Acute renal failure superimposed on chronic kidney disease (HCC)   NSTEMI (non-ST elevated myocardial infarction) ([place])   CAD S/P percutaneous coronary angioplasty   Uncontrolled diabetes mellitus ([place])   Acute blood loss anemia    Time spent: [place]. If 7PM-7AM, please contact night-coverage at [email or web address], password [facility] 05/19/2017, 6:42 PM  LOS: 2 days

## 2017-05-19 NOTE — Progress Notes (Signed)
Patient finishing with first dialysis  Will reassess in am.

## 2017-05-20 LAB — CBC WITH DIFFERENTIAL/PLATELET
BASOS ABS: 0 10*3/uL (ref 0.0–0.1)
BASOS PCT: 0 %
EOS ABS: 0.3 10*3/uL (ref 0.0–0.7)
Eosinophils Relative: 3 %
HEMATOCRIT: 22.8 % — AB (ref 39.0–52.0)
Hemoglobin: 7.3 g/dL — ABNORMAL LOW (ref 13.0–17.0)
Lymphocytes Relative: 9 %
Lymphs Abs: 0.8 10*3/uL (ref 0.7–4.0)
MCH: 29.7 pg (ref 26.0–34.0)
MCHC: 32 g/dL (ref 30.0–36.0)
MCV: 92.7 fL (ref 78.0–100.0)
MONO ABS: 1.2 10*3/uL — AB (ref 0.1–1.0)
MONOS PCT: 13 %
NEUTROS ABS: 6.7 10*3/uL (ref 1.7–7.7)
Neutrophils Relative %: 75 %
Platelets: 237 10*3/uL (ref 150–400)
RBC: 2.46 MIL/uL — ABNORMAL LOW (ref 4.22–5.81)
RDW: 14.9 % (ref 11.5–15.5)
WBC: 8.9 10*3/uL (ref 4.0–10.5)

## 2017-05-20 LAB — RENAL FUNCTION PANEL
ALBUMIN: 2.4 g/dL — AB (ref 3.5–5.0)
ANION GAP: 15 (ref 5–15)
BUN: 91 mg/dL — ABNORMAL HIGH (ref 6–20)
CALCIUM: 8 mg/dL — AB (ref 8.9–10.3)
CO2: 22 mmol/L (ref 22–32)
Chloride: 103 mmol/L (ref 101–111)
Creatinine, Ser: 3.37 mg/dL — ABNORMAL HIGH (ref 0.61–1.24)
GFR calc non Af Amer: 17 mL/min — ABNORMAL LOW (ref >60)
GFR, EST AFRICAN AMERICAN: 19 mL/min — AB (ref >60)
GLUCOSE: 121 mg/dL — AB (ref 65–99)
PHOSPHORUS: 6.9 mg/dL — AB (ref 2.5–4.6)
Potassium: 3.4 mmol/L — ABNORMAL LOW (ref 3.5–5.1)
SODIUM: 140 mmol/L (ref 135–145)

## 2017-05-20 LAB — PTH, INTACT AND CALCIUM
CALCIUM TOTAL (PTH): 8.2 mg/dL — AB (ref 8.6–10.2)
PTH: 428 pg/mL — AB (ref 15–65)

## 2017-05-20 LAB — GLUCOSE, CAPILLARY
GLUCOSE-CAPILLARY: 70 mg/dL (ref 65–99)
GLUCOSE-CAPILLARY: 127 mg/dL — AB (ref 65–99)
GLUCOSE-CAPILLARY: 97 mg/dL (ref 65–99)
Glucose-Capillary: 195 mg/dL — ABNORMAL HIGH (ref 65–99)

## 2017-05-20 LAB — HEPATITIS B SURFACE ANTIBODY,QUALITATIVE: Hep B S Ab: NONREACTIVE

## 2017-05-20 LAB — HEPATITIS B SURFACE ANTIGEN: HEP B S AG: NEGATIVE

## 2017-05-20 LAB — HEPATITIS B CORE ANTIBODY, TOTAL: Hep B Core Total Ab: NEGATIVE

## 2017-05-20 MED ORDER — INSULIN ASPART 100 UNIT/ML ~~LOC~~ SOLN
0.0000 [IU] | Freq: Three times a day (TID) | SUBCUTANEOUS | Status: DC
  Administered 2017-05-20: 1 [IU] via SUBCUTANEOUS
  Administered 2017-05-21 (×2): 2 [IU] via SUBCUTANEOUS
  Administered 2017-05-22: 1 [IU] via SUBCUTANEOUS
  Administered 2017-05-22 (×2): 2 [IU] via SUBCUTANEOUS
  Administered 2017-05-23: 3 [IU] via SUBCUTANEOUS
  Administered 2017-05-23: 1 [IU] via SUBCUTANEOUS

## 2017-05-20 MED ORDER — INSULIN DETEMIR 100 UNIT/ML ~~LOC~~ SOLN
18.0000 [IU] | Freq: Two times a day (BID) | SUBCUTANEOUS | Status: DC
  Administered 2017-05-20 – 2017-05-24 (×8): 18 [IU] via SUBCUTANEOUS
  Filled 2017-05-20 (×9): qty 0.18

## 2017-05-20 MED ORDER — DARBEPOETIN ALFA 40 MCG/0.4ML IJ SOSY
40.0000 ug | PREFILLED_SYRINGE | INTRAMUSCULAR | Status: DC
  Administered 2017-05-20: 40 ug via INTRAVENOUS
  Filled 2017-05-20: qty 0.4

## 2017-05-20 MED ORDER — HYDROCORTISONE ACETATE 25 MG RE SUPP
25.0000 mg | Freq: Two times a day (BID) | RECTAL | Status: DC | PRN
Start: 2017-05-20 — End: 2017-05-24

## 2017-05-20 MED ORDER — ACETAMINOPHEN 325 MG PO TABS
650.0000 mg | ORAL_TABLET | ORAL | Status: DC | PRN
  Administered 2017-05-24: 650 mg via ORAL
  Filled 2017-05-20: qty 2

## 2017-05-20 MED ORDER — INSULIN ASPART 100 UNIT/ML ~~LOC~~ SOLN
0.0000 [IU] | Freq: Every day | SUBCUTANEOUS | Status: DC
  Administered 2017-05-22: 2 [IU] via SUBCUTANEOUS

## 2017-05-20 NOTE — Progress Notes (Signed)
[facility] Progress Note    Assessment/ Plan:   1.  AKI on CKD 4: Now ESRD.  Creatinine improved at 3.37 following first HD session.  BUN 91, K 3.4, CO2 and other electrolytes WNL.  Remains without oligouria, UOP -0.95 L last 24 hours.  Suspect heart failure exacerbation is related to worsening kidney disease which should improve with dialysis.  Plan for additional dialysis later today.  Anticipate lifelong dialysis at this point.  2.  Acute on chronic combined heart failure  ICM  CAD: Secondary to fluid overload state due to worsening kidney function.  Now on HD.  Currently on Plavix.  Cardiology following.  3.  Anion gap metabolic acidosis: Resolved.  Likely due to new onset ESRD.  4.  Acute blood loss anemia on anemia of CKD: S/p x1 pRBC 4/24 Hgb 6.9.  Stable at 7.3 today.  Transfusion threshold <7.  5.  HTN: Remains normotensive following HD session 4/25.  Antihypertensives include BiDil, Coreg, Lasix.  6.  T2DM: CBGs controlled.  On insulin.  Per primary.  Subjective:   Patient doing well today.  No new complaints following first dialysis session yesterday.   Objective:   BP 120/63 (BP Location: Left Arm)   Pulse 70   Temp 98.4 F (36.9 C) (Oral)   Resp (!) 24   Ht 6' (1.829 m)   Wt 229 lb 4.8 oz (104 kg)   SpO2 95%   BMI 31.10 kg/m   Intake/Output Summary (Last 24 hours) at 05/20/2017 2952 Last data filed at 05/20/2017 0700 Gross per 24 hour  Intake 600 ml  Output 2940 ml  Net -2340 ml   Weight change: -1 lb 5.2 oz (-0.6 kg)  Physical Exam: General: obese, well developed, NAD with non-toxic appearance HEENT: normocephalic, atraumatic, moist mucous membranes Neck: supple, no JVD Cardiovascular: regular rate and rhythm without murmurs, rubs, or gallops Lungs: clear to auscultation bilaterally with normal work of breathing on 2 L [employer] Abdomen: soft, non-tender, non-distended, normoactive bowel sounds Skin: warm, dry, no rashes or lesions, cap refill  < 2 seconds Extremities: warm and well perfused, normal tone, improved mild pitting edema to lower extremities bilaterally up to mid shin  Imaging: Ir Fluoro Guide Cv Line Right  Result Date: 05/19/2017 CLINICAL DATA:  Renal failure. Maturing fistula. Needs access for hemodialysis. EXAM: TUNNELED HEMODIALYSIS CATHETER PLACEMENT WITH ULTRASOUND AND FLUOROSCOPIC GUIDANCE TECHNIQUE: The procedure, risks, benefits, and alternatives were explained to the patient. Questions regarding the procedure were encouraged and answered. The patient understands and consents to the procedure. As antibiotic prophylaxis, cefazolin 2 g was ordered pre-procedure and administered intravenously within one hour of incision.Patency of the right IJ vein was confirmed with ultrasound with image documentation. An appropriate skin site was determined. Region was prepped using maximum barrier technique including cap and mask, sterile gown, sterile gloves, large sterile sheet, and Chlorhexidine as cutaneous antisepsis. The region was infiltrated locally with 1% lidocaine. Intravenous Fentanyl and Versed were administered as conscious sedation during continuous monitoring of the patient's level of consciousness and physiological / cardiorespiratory status by the radiology RN, with a total moderate sedation time of 13 minutes. Under real-time ultrasound guidance, the right IJ vein was accessed with a 21 gauge micropuncture needle; the needle tip within the vein was confirmed with ultrasound image documentation. Needle exchanged over the 018 guidewire for transitional dilator, which allowed advancement of a Benson wire into the IVC. Over this, an MPA catheter was advanced. A Palindrome 19 hemodialysis catheter was  tunneled from the right anterior chest wall approach to the right IJ dermatotomy site. The MPA catheter was exchanged over an Amplatz wire for serial vascular dilators which allow placement of a peel-away sheath, through which the  catheter was advanced under intermittent fluoroscopy, positioned with its tips in the proximal and midright atrium. Spot chest radiograph confirms good catheter position. No pneumothorax. Catheter was flushed and primed per protocol. Catheter secured externally with O Prolene sutures. The right IJ dermatotomy site was closed with Dermabond. COMPLICATIONS: COMPLICATIONS None immediate FLUOROSCOPY TIME:  30 seconds; 8 mGy COMPARISON:  None IMPRESSION: 1. Technically successful placement of tunneled right IJ hemodialysis catheter with ultrasound and fluoroscopic guidance. Ready for routine use. Electronically Signed   By: Lucrezia Europe M.D.   On: 05/19/2017 10:12   Ir US Guide Vasc Access Right  Result Date: 05/19/2017 CLINICAL DATA:  Renal failure. Maturing fistula. Needs access for hemodialysis. EXAM: TUNNELED HEMODIALYSIS CATHETER PLACEMENT WITH ULTRASOUND AND FLUOROSCOPIC GUIDANCE TECHNIQUE: The procedure, risks, benefits, and alternatives were explained to the patient. Questions regarding the procedure were encouraged and answered. The patient understands and consents to the procedure. As antibiotic prophylaxis, cefazolin 2 g was ordered pre-procedure and administered intravenously within one hour of incision.Patency of the right IJ vein was confirmed with ultrasound with image documentation. An appropriate skin site was determined. Region was prepped using maximum barrier technique including cap and mask, sterile gown, sterile gloves, large sterile sheet, and Chlorhexidine as cutaneous antisepsis. The region was infiltrated locally with 1% lidocaine. Intravenous Fentanyl and Versed were administered as conscious sedation during continuous monitoring of the patient's level of consciousness and physiological / cardiorespiratory status by the radiology RN, with a total moderate sedation time of 13 minutes. Under real-time ultrasound guidance, the right IJ vein was accessed with a 21 gauge micropuncture needle; the  needle tip within the vein was confirmed with ultrasound image documentation. Needle exchanged over the 018 guidewire for transitional dilator, which allowed advancement of a Benson wire into the IVC. Over this, an MPA catheter was advanced. A Palindrome 19 hemodialysis catheter was tunneled from the right anterior chest wall approach to the right IJ dermatotomy site. The MPA catheter was exchanged over an Amplatz wire for serial vascular dilators which allow placement of a peel-away sheath, through which the catheter was advanced under intermittent fluoroscopy, positioned with its tips in the proximal and midright atrium. Spot chest radiograph confirms good catheter position. No pneumothorax. Catheter was flushed and primed per protocol. Catheter secured externally with O Prolene sutures. The right IJ dermatotomy site was closed with Dermabond. COMPLICATIONS: COMPLICATIONS None immediate FLUOROSCOPY TIME:  30 seconds; 8 mGy COMPARISON:  None IMPRESSION: 1. Technically successful placement of tunneled right IJ hemodialysis catheter with ultrasound and fluoroscopic guidance. Ready for routine use. Electronically Signed   By: Lucrezia Europe M.D.   On: 05/19/2017 10:12    Labs: BMET Recent Labs  Lab 05/17/17 1550 05/18/17 0254 05/19/17 0230 05/20/17 0255  NA 138 135 138 140  K 4.4 3.9 3.8 3.4*  CL 104 102 103 103  CO2 15* 16* 19* 22  GLUCOSE 338* 320* 233* 121*  BUN 127* 133* 132* 91*  CREATININE 4.48* 4.49* 4.44* 3.37*  CALCIUM 8.1* 7.8* 8.1*  8.2* 8.0*  PHOS  --   --  10.6* 6.9*   CBC Recent Labs  Lab 05/18/17 0254 05/18/17 1441 05/19/17 0230 05/20/17 0255  WBC 10.0 9.8 11.0* 8.9  NEUTROABS 7.4 7.2  --  6.7  HGB 6.9*  7.9* 7.6* 7.3*  HCT 21.7* 24.9* 24.3* 22.8*  MCV 92.7 93.3 92.4 92.7  PLT 246 238 243 237    Medications:    . aspirin EC  81 mg Oral Daily  . atorvastatin  80 mg Oral QHS  . calcium acetate  1,334 mg Oral TID WC  . carvedilol  3.125 mg Oral BID WC  . clopidogrel   75 mg Oral Daily  . darbepoetin (ARANESP) injection - DIALYSIS  40 mcg Intravenous Q Thu-HD  . docusate sodium  100 mg Oral BID  . ferrous sulfate  325 mg Oral BID WC  . gabapentin  300 mg Oral QHS  . guaiFENesin  600 mg Oral BID  . insulin aspart  0-15 Units Subcutaneous TID WC  . insulin detemir  20 Units Subcutaneous BID  . isosorbide-hydrALAZINE  1 tablet Oral BID  . sodium chloride flush  3 mL Intravenous Q12H     Harriet Butte, DO, PGY-2

## 2017-05-20 NOTE — Care Management Note (Addendum)
Case Management Note [name] RN, BSN Unit 4E-Case Manager [phone]  Patient Details  Name: LENA FIELDHOUSE MRN: [id number] Date of Birth: [birth year]  Subjective/Objective:  Pt admitted with acute on Chronic HF- plan to start HD tx for vol. Overload- s/p tunneled HD cath- first HD today 4/25                Action/Plan: PTA pt lived at home with spouse- Notified by April at [facility]- pt is established at the [facility] - PCP is Dr. Romero Liner, [place] contact for PCP is Drue Stager- [phone] ext. [phone] or pager [phone] CM to follow for transition of care needs.   Expected Discharge Date:                  Expected Discharge Plan:  Home/Self Care  In-House Referral:     Discharge planning Services  CM Consult, Other - See comment  Post Acute Care Choice:    Choice offered to:     DME Arranged:    DME Agency:     HH Arranged:    HH Agency:     Status of Service:  In process, will continue to follow  If discussed at Long Length of Stay Meetings, dates discussed:    Discharge Disposition:   Additional Comments:  05/20/17- 1200- [name] RN, CM- call received from Arlan Organ at [facility] clinic regarding pt's HD needs- she would be the one to set pt up at the [facility] for HD needs- spoke with pt at bedside and confirmed with him that he wants to use the [place] for his HD needs- preference is at the [place] location- he request that [name] contact his wife [name] regarding the HD arrangements at the [place]- return call made to [place] regarding patients preference for HD arrangements through the [place] and request that she contact pt's wife. [name]'s contact at the [place] is- [phone] ext. [phone] for HD needs.  Update- 1400- received call back from [facility] will start process for outpt HD setup- needed info faxed to [place] at [phone] via epic- pt will need PPD placed- have notified Dr. Thereasa Solo regarding this along with Hep B status- per  [name] [facility] (however [facility] are unable to do new starts on sat.) - CM will f/u first of next week regarding the outpt HD arrangements.   Dawayne Patricia, RN 05/20/2017, 12:05 PM

## 2017-05-20 NOTE — Procedures (Signed)
Patient was seen on dialysis and the procedure was supervised.  BFR 250  Via catheter BP is  115/66. Second HD, tolerating well. Next HD on Monday.   Patient appears to be tolerating treatment well  Dron Tanna Furry 05/20/2017

## 2017-05-20 NOTE — Progress Notes (Signed)
[place] TEAM 1 - Stepdown/ICU TEAM  Mitchell Garcia  HER:[id number] DOB: 14-Jan-1946 DOA: 05/17/2017 PCP: [name], MD    Brief Narrative:  72 y.o. male w/ a hx of CAD s/p stents, CHF with EF 35%, CKD stage IV, DM2, CVA with right-sided weakness, and anemia who was advised to come in by the [place] for abnormal lab work.  He noted progressively worsening lower extremity swelling over the last week as well as mild left-sided stabbing chest pain.  He had an AV fistula placed 3 weeks prior in the right upper extremity in preparation for hemodialysis.  He had also been recently placed on antibiotics for lower extremity redness, but denies any change in symptoms.  Denies any significant fever, chills, nausea, vomiting, or diarrhea.  ED labs noted BUN 127, creatinine 4.48.  Significant Events: 4/23 admit  4/25 RIJ Palindrome tunneled HD cath in IR > 1st HD tx   Subjective: Patient tolerated his first dialysis session yesterday without any significant difficulty.  He is resting comfortably in bed at this time.  He denies chest pain shortness of breath fevers chills nausea or vomiting.  He did have some bright red blood streaking with a bowel movement earlier.  He denies this being a recurring issue.  He denies melena or hematemesis.  Assessment & Plan:  Chronic kidney disease > ESRD Care as per Nephrology - to have HD again today - outpt tx to be arranged  Acute on chronic combined systolic and diastolic CHF EF 85% - volume management now per HD - EDW to be established   CAD S/P percutaneous coronary angioplasty No evidence of acute coronary syndrome at this time - cath 2018 noted severe diffuse CAD - cont med tx   Uncontrolled diabetes mellitus CBG reasonably controlled at this time - follow w/o change today   Chronic anemia due to CKD Follow hemoglobin in serial fashion -iron and erythropoietin per nephrology  BRBPR Clinically most consistent with hemorrhoidal bleeding or small  fissure - no evidence of hemodynamically significant blood loss at this time - follow - avoid constipation   Anxiety Well controlled at this time   HLD Cont usual home medical tx   DVT prophylaxis: SCDs Code Status: FULL CODE Family Communication: Spoke with wife at bedside Disposition Plan: Stable for transfer to telemetry bed -ongoing dialysis per nephrology -home when outpatient dialysis arrangements completed and stability of hemoglobin confirmed  Consultants:  Nephrology  Cardiology   Antimicrobials:  none   Objective: Blood pressure 123/70, pulse 73, temperature 98.4 F (36.9 C), temperature source Oral, resp. rate (!) 21, height 6' (1.829 m), weight 104 kg (229 lb 4.8 oz), SpO2 97 %.  Intake/Output Summary (Last 24 hours) at 05/20/2017 1002 Last data filed at 05/20/2017 0700 Gross per 24 hour  Intake 600 ml  Output 2790 ml  Net -2190 ml   Filed Weights   05/19/17 1324 05/19/17 1610 05/20/17 0502  Weight: 107.1 kg (236 lb 1.8 oz) 105.1 kg (231 lb 11.3 oz) 104 kg (229 lb 4.8 oz)    Examination: General: No acute respiratory distress Lungs: Clear to auscultation bilaterally without wheezes or crackles Cardiovascular: Regular rate and rhythm without murmur gallop or rub normal S1 and S2 Abdomen: Nontender, nondistended, soft, bowel sounds positive, no rebound, no ascites, no appreciable mass Extremities: 1+ B LE edema   CBC: Recent Labs  Lab 05/18/17 0254 05/18/17 1441 05/19/17 0230 05/20/17 0255  WBC 10.0 9.8 11.0* 8.9  NEUTROABS 7.4 7.2  --  6.7  HGB 6.9* 7.9* 7.6* 7.3*  HCT 21.7* 24.9* 24.3* 22.8*  MCV 92.7 93.3 92.4 92.7  PLT 246 238 243 035   Basic Metabolic Panel: Recent Labs  Lab 05/18/17 0254 05/19/17 0230 05/20/17 0255  NA 135 138 140  K 3.9 3.8 3.4*  CL 102 103 103  CO2 16* 19* 22  GLUCOSE 320* 233* 121*  BUN 133* 132* 91*  CREATININE 4.49* 4.44* 3.37*  CALCIUM 7.8* 8.1*  8.2* 8.0*  PHOS  --  10.6* 6.9*   GFR: Estimated Creatinine  Clearance: 24.4 mL/min (A) (by C-G formula based on SCr of 3.37 mg/dL (H)).  Liver Function Tests: Recent Labs  Lab 05/19/17 0230 05/20/17 0255  ALBUMIN 2.6* 2.4*    Coagulation Profile: Recent Labs  Lab 05/19/17 0230  INR 1.20    Cardiac Enzymes: Recent Labs  Lab 05/17/17 2052 05/18/17 0254 05/18/17 0903  TROPONINI 0.97* 1.05* 0.86*    HbA1C: Hgb A1c MFr Bld  Date/Time Value Ref Range Status  10/05/2016 02:18 AM 7.6 (H) 4.8 - 5.6 % Final    Comment:    (NOTE) Pre diabetes:          5.7%-6.4% Diabetes:              >6.4% Glycemic control for   <7.0% adults with diabetes   01/25/2013 06:15 AM 8.9 (H) <5.7 % Final    Comment:    (NOTE)                                                                       According to the ADA Clinical Practice Recommendations for 2011, when HbA1c is used as a screening test:  >=6.5%   Diagnostic of Diabetes Mellitus           (if abnormal result is confirmed) 5.7-6.4%   Increased risk of developing Diabetes Mellitus References:Diagnosis and Classification of Diabetes Mellitus,Diabetes KKXF,8182,99(BZJIR 1):S62-S69 and Standards of Medical Care in         Diabetes - 2011,Diabetes Care,2011,34 (Suppl 1):S11-S61.    CBG: Recent Labs  Lab 05/19/17 0622 05/19/17 1104 05/19/17 1643 05/19/17 2045 05/20/17 0625  GLUCAP 176* 144* 131* 206* 70    Scheduled Meds: . aspirin EC  81 mg Oral Daily  . atorvastatin  80 mg Oral QHS  . calcium acetate  1,334 mg Oral TID WC  . carvedilol  3.125 mg Oral BID WC  . clopidogrel  75 mg Oral Daily  . darbepoetin (ARANESP) injection - DIALYSIS  40 mcg Intravenous Q Thu-HD  . docusate sodium  100 mg Oral BID  . ferrous sulfate  325 mg Oral BID WC  . gabapentin  300 mg Oral QHS  . guaiFENesin  600 mg Oral BID  . insulin aspart  0-15 Units Subcutaneous TID WC  . insulin detemir  20 Units Subcutaneous BID  . isosorbide-hydrALAZINE  1 tablet Oral BID  . sodium chloride flush  3 mL Intravenous  Q12H     LOS: 3 days   Cherene Altes, MD [facility] Office  [phone] Pager - Text Page per Shea Evans as per below:  On-Call/Text Page:      [email or web address]      password [email or web address]  If 7PM-7AM, please contact  night-coverage [email or web address] Password [email or web address] 05/20/2017, 10:02 AM

## 2017-05-20 NOTE — Progress Notes (Signed)
Inpatient Diabetes Program Recommendations  AACE/ADA: New Consensus Statement on Inpatient Glycemic Control (2019)  Target Ranges:  Prepandial:   less than 140 mg/dL      Peak postprandial:   less than 180 mg/dL (1-2 hours)      Critically ill patients:  140 - 180 mg/dL  Results for Mitchell Garcia, Mitchell Garcia (MRN [id number]) as of 05/20/2017 09:25  Ref. Range 05/19/2017 06:22 05/19/2017 11:04 05/19/2017 16:43 05/19/2017 20:45 05/20/2017 06:25  Glucose-Capillary Latest Ref Range: 65 - 99 mg/dL 176 (H)   144 (H)  Levemir 20 units 131 (H)  Novolog 2 units 206 (H)  Levemir 20 units 70   Results for Mitchell Garcia, Mitchell Garcia (MRN [id number]) as of 05/19/2017 09:14  Ref. Range 05/18/2017 06:33 05/18/2017 11:18 05/18/2017 16:07 05/18/2017 21:26 05/19/2017 06:22  Glucose-Capillary Latest Ref Range: 65 - 99 mg/dL 259 (H) 301 (H) 289 (H) 335 (H)  Levemir 20 units @22 :00 176 (H)    Review of Glycemic Control  Diabetes history: DM2 Outpatient Diabetes medications: 70/30 95 units BID Current orders for Inpatient glycemic control: Levemir 20 mg BID, Novolog 0-15 units TID with meals  Inpatient Diabetes Program Recommendations: Insulin - Basal: Please consider decreasing Levemir to 18 units BID. Correction (SSI): Please consider decreasing Novolog correction to sensitive scale 0-9 units TID with meals and add Novolog 0-5 units QHS. Outpatient DM regimen: Patient is interested in switching from 70/30 insulin to Levemir (or Lantus) and Novolog regimen (similar to inpatient regimen if glucose is well controlled).   NOTE: Diabetes Coordinator spoke with patient and his wife at length on 05/18/17 (see note for details). Patient rarely takes 70/30 as prescribed and has experienced severe hypoglycemia a few months ago so is fearful of going low. Patient has been on Lantus and Novolog regimen in the past and is interested in switching to basal/bolus regimen so doses can be adjusted per meal and daily routine.  Thanks, Barnie Alderman,  RN, MSN, CDE Diabetes Coordinator Inpatient Diabetes Program [phone] (Team Pager from 8am to 5pm)

## 2017-05-20 NOTE — Progress Notes (Addendum)
Progress Note  Patient Name: Mitchell Garcia Date of Encounter: 05/20/2017  Primary Cardiologist: Lauree Chandler, MD   Subjective   Reports improvement in his breathing today following dialysis initiation yesterday. Wife note overall he has more energy today. He is hopeful to be discharged soon and would like to continue dialysis at the [place] which is close to his house. He denies CP or palpitations.  Inpatient Medications    Scheduled Meds: . aspirin EC  81 mg Oral Daily  . atorvastatin  80 mg Oral QHS  . calcium acetate  1,334 mg Oral TID WC  . carvedilol  3.125 mg Oral BID WC  . clopidogrel  75 mg Oral Daily  . darbepoetin (ARANESP) injection - DIALYSIS  40 mcg Intravenous Q Thu-HD  . docusate sodium  100 mg Oral BID  . ferrous sulfate  325 mg Oral BID WC  . gabapentin  300 mg Oral QHS  . guaiFENesin  600 mg Oral BID  . insulin aspart  0-15 Units Subcutaneous TID WC  . insulin detemir  20 Units Subcutaneous BID  . isosorbide-hydrALAZINE  1 tablet Oral BID  . sodium chloride flush  3 mL Intravenous Q12H   Continuous Infusions: . sodium chloride    . sodium chloride    . sodium chloride    . sodium chloride    . sodium chloride     PRN Meds: sodium chloride, sodium chloride, sodium chloride, sodium chloride, sodium chloride, acetaminophen, ALPRAZolam, alteplase, alteplase, benzonatate, heparin, heparin, lidocaine (PF), lidocaine (PF), lidocaine-prilocaine, lidocaine-prilocaine, nitroGLYCERIN, ondansetron (ZOFRAN) IV, pentafluoroprop-tetrafluoroeth, pentafluoroprop-tetrafluoroeth, sodium chloride flush   Vital Signs    Vitals:   05/19/17 2328 05/19/17 2340 05/20/17 0502 05/20/17 0816  BP: 125/63  120/63 123/70  Pulse: 80 73 70 73  Resp: (!) 21 (!) 30 (!) 24 (!) 21  Temp: 98.4 F (36.9 C)  98.4 F (36.9 C) 98.4 F (36.9 C)  TempSrc: Oral  Oral Oral  SpO2: (!) 88% 96% 95% 97%  Weight:   229 lb 4.8 oz (104 kg)   Height:        Intake/Output Summary (Last 24  hours) at 05/20/2017 0935 Last data filed at 05/20/2017 0700 Gross per 24 hour  Intake 600 ml  Output 2790 ml  Net -2190 ml   Filed Weights   05/19/17 1324 05/19/17 1610 05/20/17 0502  Weight: 236 lb 1.8 oz (107.1 kg) 231 lb 11.3 oz (105.1 kg) 229 lb 4.8 oz (104 kg)    Telemetry    sinus rhythm with PVCs/PACs - Personally Reviewed  Physical Exam   GEN: Sitting on the side of his bed in no acute distress.   Neck: JVD not assessed as patient was sitting upright, no carotid bruits Cardiac: RRR, no murmurs, rubs, or gallops.  Respiratory: Clear to auscultation bilaterally, no wheezes/ rales/ rhonchi GI: NABS, Soft, nontender, non-distended  Vasc: fistula in RUE with palpable thrill.  MS: 1+ LE edema; No deformity. Neuro:  Nonfocal, moving all extremities spontaneously Psych: Normal affect   Labs    Chemistry Recent Labs  Lab 05/18/17 0254 05/19/17 0230 05/20/17 0255  NA 135 138 140  K 3.9 3.8 3.4*  CL 102 103 103  CO2 16* 19* 22  GLUCOSE 320* 233* 121*  BUN 133* 132* 91*  CREATININE 4.49* 4.44* 3.37*  CALCIUM 7.8* 8.1*  8.2* 8.0*  ALBUMIN  --  2.6* 2.4*  GFRNONAA 12* 12* 17*  GFRAA 14* 14* 19*  ANIONGAP 17* 16* 15  Hematology Recent Labs  Lab 05/18/17 1441 05/19/17 0230 05/20/17 0255  WBC 9.8 11.0* 8.9  RBC 2.67* 2.63* 2.46*  HGB 7.9* 7.6* 7.3*  HCT 24.9* 24.3* 22.8*  MCV 93.3 92.4 92.7  MCH 29.6 28.9 29.7  MCHC 31.7 31.3 32.0  RDW 15.1 15.3 14.9  PLT 238 243 237    Cardiac Enzymes Recent Labs  Lab 05/17/17 2052 05/18/17 0254 05/18/17 0903  TROPONINI 0.97* 1.05* 0.86*    Recent Labs  Lab 05/17/17 1642  TROPIPOC 1.33*     BNP Recent Labs  Lab 05/17/17 1550  BNP 754.3*     DDimer No results for input(s): DDIMER in the last 168 hours.   Radiology    Ir Fluoro Guide Cv Line Right  Result Date: 05/19/2017 CLINICAL DATA:  Renal failure. Maturing fistula. Needs access for hemodialysis. EXAM: TUNNELED HEMODIALYSIS CATHETER PLACEMENT  WITH ULTRASOUND AND FLUOROSCOPIC GUIDANCE TECHNIQUE: The procedure, risks, benefits, and alternatives were explained to the patient. Questions regarding the procedure were encouraged and answered. The patient understands and consents to the procedure. As antibiotic prophylaxis, cefazolin 2 g was ordered pre-procedure and administered intravenously within one hour of incision.Patency of the right IJ vein was confirmed with ultrasound with image documentation. An appropriate skin site was determined. Region was prepped using maximum barrier technique including cap and mask, sterile gown, sterile gloves, large sterile sheet, and Chlorhexidine as cutaneous antisepsis. The region was infiltrated locally with 1% lidocaine. Intravenous Fentanyl and Versed were administered as conscious sedation during continuous monitoring of the patient's level of consciousness and physiological / cardiorespiratory status by the radiology RN, with a total moderate sedation time of 13 minutes. Under real-time ultrasound guidance, the right IJ vein was accessed with a 21 gauge micropuncture needle; the needle tip within the vein was confirmed with ultrasound image documentation. Needle exchanged over the 018 guidewire for transitional dilator, which allowed advancement of a Benson wire into the IVC. Over this, an MPA catheter was advanced. A Palindrome 19 hemodialysis catheter was tunneled from the right anterior chest wall approach to the right IJ dermatotomy site. The MPA catheter was exchanged over an Amplatz wire for serial vascular dilators which allow placement of a peel-away sheath, through which the catheter was advanced under intermittent fluoroscopy, positioned with its tips in the proximal and midright atrium. Spot chest radiograph confirms good catheter position. No pneumothorax. Catheter was flushed and primed per protocol. Catheter secured externally with O Prolene sutures. The right IJ dermatotomy site was closed with  Dermabond. COMPLICATIONS: COMPLICATIONS None immediate FLUOROSCOPY TIME:  30 seconds; 8 mGy COMPARISON:  None IMPRESSION: 1. Technically successful placement of tunneled right IJ hemodialysis catheter with ultrasound and fluoroscopic guidance. Ready for routine use. Electronically Signed   By: Lucrezia Europe M.D.   On: 05/19/2017 10:12   Ir US Guide Vasc Access Right  Result Date: 05/19/2017 CLINICAL DATA:  Renal failure. Maturing fistula. Needs access for hemodialysis. EXAM: TUNNELED HEMODIALYSIS CATHETER PLACEMENT WITH ULTRASOUND AND FLUOROSCOPIC GUIDANCE TECHNIQUE: The procedure, risks, benefits, and alternatives were explained to the patient. Questions regarding the procedure were encouraged and answered. The patient understands and consents to the procedure. As antibiotic prophylaxis, cefazolin 2 g was ordered pre-procedure and administered intravenously within one hour of incision.Patency of the right IJ vein was confirmed with ultrasound with image documentation. An appropriate skin site was determined. Region was prepped using maximum barrier technique including cap and mask, sterile gown, sterile gloves, large sterile sheet, and Chlorhexidine as cutaneous antisepsis. The region  was infiltrated locally with 1% lidocaine. Intravenous Fentanyl and Versed were administered as conscious sedation during continuous monitoring of the patient's level of consciousness and physiological / cardiorespiratory status by the radiology RN, with a total moderate sedation time of 13 minutes. Under real-time ultrasound guidance, the right IJ vein was accessed with a 21 gauge micropuncture needle; the needle tip within the vein was confirmed with ultrasound image documentation. Needle exchanged over the 018 guidewire for transitional dilator, which allowed advancement of a Benson wire into the IVC. Over this, an MPA catheter was advanced. A Palindrome 19 hemodialysis catheter was tunneled from the right anterior chest wall  approach to the right IJ dermatotomy site. The MPA catheter was exchanged over an Amplatz wire for serial vascular dilators which allow placement of a peel-away sheath, through which the catheter was advanced under intermittent fluoroscopy, positioned with its tips in the proximal and midright atrium. Spot chest radiograph confirms good catheter position. No pneumothorax. Catheter was flushed and primed per protocol. Catheter secured externally with O Prolene sutures. The right IJ dermatotomy site was closed with Dermabond. COMPLICATIONS: COMPLICATIONS None immediate FLUOROSCOPY TIME:  30 seconds; 8 mGy COMPARISON:  None IMPRESSION: 1. Technically successful placement of tunneled right IJ hemodialysis catheter with ultrasound and fluoroscopic guidance. Ready for routine use. Electronically Signed   By: Lucrezia Europe M.D.   On: 05/19/2017 10:12    Cardiac Studies   Echocardiogram 09/2016: Study Conclusions  - Left ventricle: Poor acoustic windows limit studly Defiinity   used. LVEF is approximately 35% with diffuse hypokinesis. The   cavity size was mildly dilated. Wall thickness was increased in a   pattern of mild LVH. Doppler parameters are consistent with   abnormal left ventricular relaxation (grade 1 diastolic   dysfunction). Doppler parameters are consistent with high   ventricular filling pressure.   Left Heart Catheterization 09/2016: Conclusions: 1. Multivessel coronary artery disease, including 50% proximal LAD stenosis, 70% proximal D1 lesion, chronic total occlusion of mid LCx, and sequential 80% ostial and 95% distal RCA stenoses. 2. Moderate to severely elevated left ventricular filling pressure.  Recommendations: 1. Cardiac surgery consultation for CABG, given complex multivessel coronary artery disease, diabetes mellitus, and reduced LVEF. If the patient is deemed a poor surgical candidate, consider PCI to RCA (this would likely require atherectomy of the ostial  lesion). 2. Restart heparin infusion 4 hours following TR band deflation.  3. Continue diuresis and optimization of heart failure therapy.   Subsequent LHC 09/2016: 1. Severe, calcified stenosis in the ostium of the RCA.  2. Successful orbital atherectomy of the ostial RCA with placement of a Synergy drug eluting stent 3. Severe distal RCA stenosis.  4. Successful PTCA/DES x 1 distal RCA (Synergy DES).   Recommendations: Continue DAPT with ASA and Plavix for at least one year.   Patient Profile     72 y.o. male with PMH of multivessel CAD with PCI/DES to RCA (not a CABG candidate), Chronic diastolic CHF (EF 87% on last echo 09/2016), HTN, HLD, CKD with progression to ESRD this admission, anemia of chronic disease, and anxiety, who presented with complaints of CP and SOB.   Assessment & Plan    1. Acute on chronic systolic CHF: Patient with ischemic cardiomyopathy. Last Echo with Ef 35%. Initially received IV lasix however was not diuresing well and so started dialysis yesterday with net -2.3L in the past 24 hours and -2.6 this admission. Wt 237lbs on admission>229lbs today. Breathing is improved today. - Continue volume  management per Nephrology Plan for dialysis again today   - Continue carvedilol and bidil  2. CP in patient with CAD s/p PCI/DES to RCA: Trop peaked at 1.05 (NSTEMI 09/2016 peaked at 30.58). Likely demand ischemia in the setting of worsening CKD and volume overload. Last [facility] 09/2016 with complex multivessel CAD recommended for CABG evaluations, however patient was deemed a poor surgical candidate and PRI to RCA occurred in f/u cath.  - Do not plan any ischemic testing at this time - Continue ASA, plavix, and statin - Continue carvedilol, bidil, and prn SL nitro  3. ESRD: patient started on HD yesterday with plans for another session today.  - Continue management per Nephrology  4. Anemia of chronic disease: on po iron supplements. Started on darbopoetin yesterday. Hgb  stable at 7.3 today - Continue to monitor H/H closely - Continue management per primary team  5. HTN: BP stable with some hypotension with HD - Continue current regimen  6. HLD: Last LDL 73 09/2016; goal <70 - Continue statin  For questions or updates, please contact [facility] Please consult [email or web address] for contact info under Cardiology/STEMI.      Signed, Abigail Butts, PA-C  05/20/2017, 9:35 AM   [phone]  Pt seen and examined  I agree with findings of K Kroeger above Pt has now had first episode of HD   Volume is much improved   Looks more comfortable He still has some volume increase on exam ON exam:  Lungs are CTA   Cardiac RRR  No S3  Abd is obese  Nontender  Ext with 1+ edema  Continue medical management for CAD and CHF   ANother round of idialysis today WOUld repeat EKG  P waves difficult to [place]

## 2017-05-21 DIAGNOSIS — I214 Non-ST elevation (NSTEMI) myocardial infarction: Secondary | ICD-10-CM

## 2017-05-21 DIAGNOSIS — Z9861 Coronary angioplasty status: Secondary | ICD-10-CM

## 2017-05-21 DIAGNOSIS — N171 Acute kidney failure with acute cortical necrosis: Secondary | ICD-10-CM

## 2017-05-21 DIAGNOSIS — I2510 Atherosclerotic heart disease of native coronary artery without angina pectoris: Secondary | ICD-10-CM

## 2017-05-21 DIAGNOSIS — N184 Chronic kidney disease, stage 4 (severe): Secondary | ICD-10-CM

## 2017-05-21 LAB — GLUCOSE, CAPILLARY
GLUCOSE-CAPILLARY: 115 mg/dL — AB (ref 65–99)
GLUCOSE-CAPILLARY: 186 mg/dL — AB (ref 65–99)
GLUCOSE-CAPILLARY: 141 mg/dL — AB (ref 65–99)
Glucose-Capillary: 191 mg/dL — ABNORMAL HIGH (ref 65–99)

## 2017-05-21 LAB — RENAL FUNCTION PANEL
ALBUMIN: 2.5 g/dL — AB (ref 3.5–5.0)
Anion gap: 12 (ref 5–15)
BUN: 56 mg/dL — AB (ref 6–20)
CO2: 25 mmol/L (ref 22–32)
Calcium: 8.2 mg/dL — ABNORMAL LOW (ref 8.9–10.3)
Chloride: 99 mmol/L — ABNORMAL LOW (ref 101–111)
Creatinine, Ser: 2.67 mg/dL — ABNORMAL HIGH (ref 0.61–1.24)
GFR calc Af Amer: 26 mL/min — ABNORMAL LOW (ref >60)
GFR calc non Af Amer: 22 mL/min — ABNORMAL LOW (ref >60)
GLUCOSE: 114 mg/dL — AB (ref 65–99)
PHOSPHORUS: 4.8 mg/dL — AB (ref 2.5–4.6)
Potassium: 3.7 mmol/L (ref 3.5–5.1)
Sodium: 136 mmol/L (ref 135–145)

## 2017-05-21 LAB — CBC
HCT: 25 % — ABNORMAL LOW (ref 39.0–52.0)
Hemoglobin: 7.7 g/dL — ABNORMAL LOW (ref 13.0–17.0)
MCH: 29.1 pg (ref 26.0–34.0)
MCHC: 30.8 g/dL (ref 30.0–36.0)
MCV: 94.3 fL (ref 78.0–100.0)
Platelets: 229 10*3/uL (ref 150–400)
RBC: 2.65 MIL/uL — ABNORMAL LOW (ref 4.22–5.81)
RDW: 15.5 % (ref 11.5–15.5)
WBC: 7.9 10*3/uL (ref 4.0–10.5)

## 2017-05-21 MED ORDER — TUBERCULIN PPD 5 UNIT/0.1ML ID SOLN
5.0000 [IU] | Freq: Once | INTRADERMAL | Status: AC
  Administered 2017-05-21: 5 [IU] via INTRADERMAL
  Filled 2017-05-21: qty 0.1

## 2017-05-21 NOTE — Progress Notes (Signed)
[facility] TEAM 1 - Stepdown/ICU TEAM  Mitchell Garcia  FXT:[id number] DOB: Jan 16, 1946 DOA: 05/17/2017 PCP: [name], MD    Brief Narrative:  72 y.o. male w/ a hx of CAD s/p stents, CHF with EF 35%, CKD stage IV, DM2, CVA with right-sided weakness, and anemia who was advised to come in by the [place] for abnormal lab work.  He noted progressively worsening lower extremity swelling over the last week as well as mild left-sided stabbing chest pain.  He had an AV fistula placed 3 weeks prior in the right upper extremity in preparation for hemodialysis.  He had also been recently placed on antibiotics for lower extremity redness, but denies any change in symptoms.  Denies any significant fever, chills, nausea, vomiting, or diarrhea.  ED labs noted BUN 127, creatinine 4.48.  Significant Events: 4/23 admit  4/25 RIJ Palindrome tunneled HD cath in IR > 1st HD tx   Subjective: The patient is resting comfortably in bed.  He does report some shortness of breath even just at rest.  He continues to feel that he is volume overloaded.  He denies chest pain nausea vomiting or abdominal pain.  He reports a fair appetite as he does not care for the food.  Assessment & Plan:  Chronic kidney disease > ESRD Care as per Nephrology - outpt tx being arranged  Acute on chronic combined systolic and diastolic CHF EF 29% - volume management now per HD - EDW to be established  Filed Weights   05/20/17 1420 05/20/17 1700 05/21/17 0655  Weight: 104.5 kg (230 lb 6.1 oz) 103 kg (227 lb 1.2 oz) 103.4 kg (227 lb 15.3 oz)    CAD S/P percutaneous coronary angioplasty No evidence of acute coronary syndrome at this time - cath 2018 noted severe diffuse CAD - cont med tx   Uncontrolled diabetes mellitus CBG controlled at this time   Chronic anemia due to CKD Follow hemoglobin in serial fashion - iron and erythropoietin per Nephrology  BRBPR Clinically most consistent with hemorrhoidal bleeding or small fissure -  no evidence of hemodynamically significant blood loss at this time - avoid constipation   Anxiety Well controlled at this time   HLD Cont usual home medical tx   DVT prophylaxis: SCDs Code Status: FULL CODE Family Communication: Spoke with wife at bedside Disposition Plan:  telemetry bed - ongoing dialysis per nephrology - home when outpatient dialysis arrangements completed  Consultants:  Nephrology  Cardiology   Antimicrobials:  none   Objective: Blood pressure (!) 140/120, pulse 71, temperature 98 F (36.7 C), temperature source Oral, resp. rate 19, height 6' (1.829 m), weight 103.4 kg (227 lb 15.3 oz), SpO2 95 %.  Intake/Output Summary (Last 24 hours) at 05/21/2017 1121 Last data filed at 05/21/2017 0649 Gross per 24 hour  Intake -  Output 1830 ml  Net -1830 ml   Filed Weights   05/20/17 1420 05/20/17 1700 05/21/17 0655  Weight: 104.5 kg (230 lb 6.1 oz) 103 kg (227 lb 1.2 oz) 103.4 kg (227 lb 15.3 oz)    Examination: General: No acute respiratory distress - alert  Lungs: mild bibasilar crackles w/o wheezing  Cardiovascular: RRR - no M or rub  Abdomen: NT/ND, soft, bs+, overweight  Extremities: 1+ B LE edema   CBC: Recent Labs  Lab 05/18/17 0254 05/18/17 1441 05/19/17 0230 05/20/17 0255 05/21/17 0746  WBC 10.0 9.8 11.0* 8.9 7.9  NEUTROABS 7.4 7.2  --  6.7  --   HGB 6.9*  7.9* 7.6* 7.3* 7.7*  HCT 21.7* 24.9* 24.3* 22.8* 25.0*  MCV 92.7 93.3 92.4 92.7 94.3  PLT 246 238 243 237 962   Basic Metabolic Panel: Recent Labs  Lab 05/19/17 0230 05/20/17 0255 05/21/17 0746  NA 138 140 136  K 3.8 3.4* 3.7  CL 103 103 99*  CO2 19* 22 25  GLUCOSE 233* 121* 114*  BUN 132* 91* 56*  CREATININE 4.44* 3.37* 2.67*  CALCIUM 8.1*  8.2* 8.0* 8.2*  PHOS 10.6* 6.9* 4.8*   GFR: Estimated Creatinine Clearance: 30.6 mL/min (A) (by C-G formula based on SCr of 2.67 mg/dL (H)).  Liver Function Tests: Recent Labs  Lab 05/19/17 0230 05/20/17 0255 05/21/17 0746    ALBUMIN 2.6* 2.4* 2.5*    Coagulation Profile: Recent Labs  Lab 05/19/17 0230  INR 1.20    Cardiac Enzymes: Recent Labs  Lab 05/17/17 2052 05/18/17 0254 05/18/17 0903  TROPONINI 0.97* 1.05* 0.86*    HbA1C: Hgb A1c MFr Bld  Date/Time Value Ref Range Status  10/05/2016 02:18 AM 7.6 (H) 4.8 - 5.6 % Final    Comment:    (NOTE) Pre diabetes:          5.7%-6.4% Diabetes:              >6.4% Glycemic control for   <7.0% adults with diabetes   01/25/2013 06:15 AM 8.9 (H) <5.7 % Final    Comment:    (NOTE)                                                                       According to the ADA Clinical Practice Recommendations for 2011, when HbA1c is used as a screening test:  >=6.5%   Diagnostic of Diabetes Mellitus           (if abnormal result is confirmed) 5.7-6.4%   Increased risk of developing Diabetes Mellitus References:Diagnosis and Classification of Diabetes Mellitus,Diabetes XBMW,4132,44(WNUUV 1):S62-S69 and Standards of Medical Care in         Diabetes - 2011,Diabetes Care,2011,34 (Suppl 1):S11-S61.    CBG: Recent Labs  Lab 05/20/17 0625 05/20/17 1222 05/20/17 1908 05/20/17 2115 05/21/17 0631  GLUCAP 70 127* 97 195* 115*    Scheduled Meds: . aspirin EC  81 mg Oral Daily  . atorvastatin  80 mg Oral QHS  . calcium acetate  1,334 mg Oral TID WC  . carvedilol  3.125 mg Oral BID WC  . clopidogrel  75 mg Oral Daily  . darbepoetin (ARANESP) injection - DIALYSIS  40 mcg Intravenous Q Fri-HD  . docusate sodium  100 mg Oral BID  . ferrous sulfate  325 mg Oral BID WC  . gabapentin  300 mg Oral QHS  . guaiFENesin  600 mg Oral BID  . insulin aspart  0-5 Units Subcutaneous QHS  . insulin aspart  0-9 Units Subcutaneous TID WC  . insulin detemir  18 Units Subcutaneous BID  . isosorbide-hydrALAZINE  1 tablet Oral BID  . sodium chloride flush  3 mL Intravenous Q12H     LOS: 4 days   Cherene Altes, MD [facility] Office  [phone] Pager  - Text Page per Shea Evans as per below:  On-Call/Text Page:      [email or web address]  password [email or web address]  If 7PM-7AM, please contact night-coverage [email or web address] Password [facility] 05/21/2017, 11:21 AM

## 2017-05-21 NOTE — Progress Notes (Signed)
Progress Note  Patient Name: Mitchell Garcia Date of Encounter: 05/21/2017  Primary Cardiologist: Lauree Chandler, MD   Subjective   Some improvement in breathing, but still labored while talking, no chest pain. Complains of cough with white sputum.  Inpatient Medications    Scheduled Meds: . aspirin EC  81 mg Oral Daily  . atorvastatin  80 mg Oral QHS  . calcium acetate  1,334 mg Oral TID WC  . carvedilol  3.125 mg Oral BID WC  . clopidogrel  75 mg Oral Daily  . darbepoetin (ARANESP) injection - DIALYSIS  40 mcg Intravenous Q Fri-HD  . docusate sodium  100 mg Oral BID  . ferrous sulfate  325 mg Oral BID WC  . gabapentin  300 mg Oral QHS  . guaiFENesin  600 mg Oral BID  . insulin aspart  0-5 Units Subcutaneous QHS  . insulin aspart  0-9 Units Subcutaneous TID WC  . insulin detemir  18 Units Subcutaneous BID  . isosorbide-hydrALAZINE  1 tablet Oral BID  . sodium chloride flush  3 mL Intravenous Q12H   Continuous Infusions: . sodium chloride    . sodium chloride    . sodium chloride    . sodium chloride    . sodium chloride     PRN Meds: sodium chloride, sodium chloride, sodium chloride, sodium chloride, sodium chloride, acetaminophen, ALPRAZolam, alteplase, alteplase, benzonatate, heparin, heparin, hydrocortisone, lidocaine (PF), lidocaine (PF), lidocaine-prilocaine, lidocaine-prilocaine, nitroGLYCERIN, ondansetron (ZOFRAN) IV, pentafluoroprop-tetrafluoroeth, pentafluoroprop-tetrafluoroeth, sodium chloride flush   Vital Signs    Vitals:   05/21/17 0012 05/21/17 0406 05/21/17 0655 05/21/17 0847  BP: (!) 124/59 106/66  (!) 140/120  Pulse: 80 64  71  Resp: (!) 26 19  19   Temp: 98 F (36.7 C) 97.9 F (36.6 C)  98 F (36.7 C)  TempSrc: Oral Oral  Oral  SpO2: 94% 94%  95%  Weight:   227 lb 15.3 oz (103.4 kg)   Height:        Intake/Output Summary (Last 24 hours) at 05/21/2017 1038 Last data filed at 05/21/2017 0649 Gross per 24 hour  Intake -  Output 1830  ml  Net -1830 ml   Filed Weights   05/20/17 1420 05/20/17 1700 05/21/17 0655  Weight: 230 lb 6.1 oz (104.5 kg) 227 lb 1.2 oz (103 kg) 227 lb 15.3 oz (103.4 kg)    Telemetry    sinus rhythm with PVCs/PACs - Personally Reviewed  Physical Exam   GEN: Sitting on the side of his bed in no acute distress.   Neck: JVD not assessed as patient was sitting upright, no carotid bruits Cardiac: RRR, no murmurs, rubs, or gallops.  Respiratory: Clear to auscultation bilaterally, no wheezes/ rales/ rhonchi GI: NABS, Soft, nontender, non-distended  Vasc: fistula in RUE with palpable thrill.  MS: 1+ LE edema; No deformity. Neuro:  Nonfocal, moving all extremities spontaneously Psych: Normal affect   Labs    Chemistry Recent Labs  Lab 05/19/17 0230 05/20/17 0255 05/21/17 0746  NA 138 140 136  K 3.8 3.4* 3.7  CL 103 103 99*  CO2 19* 22 25  GLUCOSE 233* 121* 114*  BUN 132* 91* 56*  CREATININE 4.44* 3.37* 2.67*  CALCIUM 8.1*  8.2* 8.0* 8.2*  ALBUMIN 2.6* 2.4* 2.5*  GFRNONAA 12* 17* 22*  GFRAA 14* 19* 26*  ANIONGAP 16* 15 12     Hematology Recent Labs  Lab 05/19/17 0230 05/20/17 0255 05/21/17 0746  WBC 11.0* 8.9 7.9  RBC 2.63* 2.46*  2.65*  HGB 7.6* 7.3* 7.7*  HCT 24.3* 22.8* 25.0*  MCV 92.4 92.7 94.3  MCH 28.9 29.7 29.1  MCHC 31.3 32.0 30.8  RDW 15.3 14.9 15.5  PLT 243 237 229    Cardiac Enzymes Recent Labs  Lab 05/17/17 2052 05/18/17 0254 05/18/17 0903  TROPONINI 0.97* 1.05* 0.86*    Recent Labs  Lab 05/17/17 1642  TROPIPOC 1.33*     BNP Recent Labs  Lab 05/17/17 1550  BNP 754.3*    DDimer No results for input(s): DDIMER in the last 168 hours.   Radiology    No results found.  Cardiac Studies   Echocardiogram 09/2016: Study Conclusions  - Left ventricle: Poor acoustic windows limit studly Defiinity   used. LVEF is approximately 35% with diffuse hypokinesis. The   cavity size was mildly dilated. Wall thickness was increased in a   pattern of  mild LVH. Doppler parameters are consistent with   abnormal left ventricular relaxation (grade 1 diastolic   dysfunction). Doppler parameters are consistent with high   ventricular filling pressure.   Left Heart Catheterization 09/2016: Conclusions: 1. Multivessel coronary artery disease, including 50% proximal LAD stenosis, 70% proximal D1 lesion, chronic total occlusion of mid LCx, and sequential 80% ostial and 95% distal RCA stenoses. 2. Moderate to severely elevated left ventricular filling pressure.  Recommendations: 1. Cardiac surgery consultation for CABG, given complex multivessel coronary artery disease, diabetes mellitus, and reduced LVEF. If the patient is deemed a poor surgical candidate, consider PCI to RCA (this would likely require atherectomy of the ostial lesion). 2. Restart heparin infusion 4 hours following TR band deflation.  3. Continue diuresis and optimization of heart failure therapy.   Subsequent LHC 09/2016: 1. Severe, calcified stenosis in the ostium of the RCA.  2. Successful orbital atherectomy of the ostial RCA with placement of a Synergy drug eluting stent 3. Severe distal RCA stenosis.  4. Successful PTCA/DES x 1 distal RCA (Synergy DES).   Recommendations: Continue DAPT with ASA and Plavix for at least one year.     Patient Profile     72 y.o. male with PMH of multivessel CAD with PCI/DES to RCA (not a CABG candidate), Chronic diastolic CHF (EF 45% on last echo 09/2016), HTN, HLD, CKD with progression to ESRD this admission, anemia of chronic disease, and anxiety, who presented with complaints of CP and SOB.   Assessment & Plan    1. Acute on chronic systolic CHF: Patient with ischemic cardiomyopathy. Last Echo with Ef 35%. Initially received IV lasix however was not diuresing well and so started dialysis on 4/25 with good diuresis, Wt 237lbs on admission>227lbs today. Breathing is improved, however still labored breathing and ongoing cough, severely  fluid overloaded on physical exam. - please consider another HD session today or tomorrow.  - Continue volume management per Nephrology  - Continue carvedilol and bidil  2. CP in patient with CAD s/p PCI/DES to RCA: Trop peaked at 1.05 (NSTEMI 09/2016 peaked at 30.58). Likely demand ischemia in the setting of worsening CKD and volume overload. Last [facility] 09/2016 with complex multivessel CAD recommended for CABG evaluations, however patient was deemed a poor surgical candidate and PRI to RCA occurred in f/u cath.  - Do not plan any ischemic testing at this time - Continue ASA, plavix, and statin - Continue carvedilol, bidil, and prn SL nitro  3. ESRD: patient started on HD yesterday with plans for another session today.  - Continue management per Nephrology  4. Anemia  of chronic disease: on po iron supplements. Started on darbopoetin yesterday. Hgb stable at 7.3 today - Continue to monitor H/H closely - Continue management per primary team  5. HTN: BP stable with some hypotension with HD - Continue current regimen  6. HLD: Last LDL 73 09/2016; goal <70 - Continue statin  For questions or updates, please contact [facility] Please consult [email or web address] for contact info under Cardiology/STEMI.      Signed, Ena Dawley, MD  05/21/2017, 10:38 AM   [phone]  Pt seen and examined  I agree with findings of K Kroeger above Pt has now had first episode of HD   Volume is much improved   Looks more comfortable He still has some volume increase on exam ON exam:  Lungs are CTA   Cardiac RRR  No S3  Abd is obese  Nontender  Ext with 1+ edema  Continue medical management for CAD and CHF   ANother round of idialysis today WOUld repeat EKG  P waves difficult to [place]

## 2017-05-21 NOTE — Progress Notes (Signed)
New Mitchell Garcia NEPHROLOGY PROGRESS NOTE  Assessment/ Plan: Pt is a 72 y.o. yo male  with history of hypertension, chronic kidney disease stage IV/V follows up with nephrologist at [facility], has right upper extremity AV fistula placed about 3 weeks ago, history of CHF, anemia, sent by his nephrologist because of worsening renal function, SOB and edema. He has acute CHF exacerbation. He has progressive CKD stage V now  ESRD in the setting of fluid overload. Status post tunneled catheter placement today by IR on 4/25. HD initiated on 4/25.  Assessment/Plan:  #CKD stage V progressed to ESRD: HD on 4/25, 4/26, tolerated well. Tunnel catheter is working well. Patient has AV fistula maturing. Plan for hemodialysis on Monday. CLIP is in progress at [place] due to chronic kidney disease: Iron stores acceptable. Continue darbepoetin. No sign of bleeding.  #Secondary hyperparathyroidism: Phosphorus level trending down. Continue PhosLo. PTH 428.[name]  #Hypertension: Blood pressure acceptable. On cardiac medication.  Subjective: Seen and examined at bedside.  Shortness of breath is improved.  No chest pain, nausea vomiting. Objective Vital signs in last 24 hours: Vitals:   05/21/17 0012 05/21/17 0406 05/21/17 0655 05/21/17 0847  BP: (!) 124/59 106/66  (!) 140/120  Pulse: 80 64  71  Resp: (!) 26 19  19   Temp: 98 F (36.7 C) 97.9 F (36.6 C)  98 F (36.7 C)  TempSrc: Oral Oral  Oral  SpO2: 94% 94%  95%  Weight:   103.4 kg (227 lb 15.3 oz)   Height:       Weight change: -2.6 kg (-5 lb 11.7 oz)  Intake/Output Summary (Last 24 hours) at 05/21/2017 1051 Last data filed at 05/21/2017 0649 Gross per 24 hour  Intake -  Output 1830 ml  Net -1830 ml       Labs: Basic Metabolic Panel: Recent Labs  Lab 05/19/17 0230 05/20/17 0255 05/21/17 0746  NA 138 140 136  K 3.8 3.4* 3.7  CL 103 103 99*  CO2 19* 22 25  GLUCOSE 233* 121* 114*  BUN 132* 91* 56*  CREATININE 4.44*  3.37* 2.67*  CALCIUM 8.1*  8.2* 8.0* 8.2*  PHOS 10.6* 6.9* 4.8*   Liver Function Tests: Recent Labs  Lab 05/19/17 0230 05/20/17 0255 05/21/17 0746  ALBUMIN 2.6* 2.4* 2.5*   No results for input(s): LIPASE, AMYLASE in the last 168 hours. No results for input(s): AMMONIA in the last 168 hours. CBC: Recent Labs  Lab 05/18/17 0254 05/18/17 1441 05/19/17 0230 05/20/17 0255 05/21/17 0746  WBC 10.0 9.8 11.0* 8.9 7.9  NEUTROABS 7.4 7.2  --  6.7  --   HGB 6.9* 7.9* 7.6* 7.3* 7.7*  HCT 21.7* 24.9* 24.3* 22.8* 25.0*  MCV 92.7 93.3 92.4 92.7 94.3  PLT 246 238 243 237 229   Cardiac Enzymes: Recent Labs  Lab 05/17/17 2052 05/18/17 0254 05/18/17 0903  TROPONINI 0.97* 1.05* 0.86*   CBG: Recent Labs  Lab 05/20/17 0625 05/20/17 1222 05/20/17 1908 05/20/17 2115 05/21/17 0631  GLUCAP 70 127* 97 195* 115*    Iron Studies:  Recent Labs    05/19/17 0230  IRON 161  TIBC 203*  FERRITIN 150   Studies/Results: No results found.  Medications: Infusions: . sodium chloride    . sodium chloride    . sodium chloride    . sodium chloride    . sodium chloride      Scheduled Medications: . aspirin EC  81 mg Oral Daily  . atorvastatin  80 mg  Oral QHS  . calcium acetate  1,334 mg Oral TID WC  . carvedilol  3.125 mg Oral BID WC  . clopidogrel  75 mg Oral Daily  . darbepoetin (ARANESP) injection - DIALYSIS  40 mcg Intravenous Q Fri-HD  . docusate sodium  100 mg Oral BID  . ferrous sulfate  325 mg Oral BID WC  . gabapentin  300 mg Oral QHS  . guaiFENesin  600 mg Oral BID  . insulin aspart  0-5 Units Subcutaneous QHS  . insulin aspart  0-9 Units Subcutaneous TID WC  . insulin detemir  18 Units Subcutaneous BID  . isosorbide-hydrALAZINE  1 tablet Oral BID  . sodium chloride flush  3 mL Intravenous Q12H    have reviewed scheduled and prn medications.  Physical Exam: General:NAD, comfortable Heart:RRR, s1s2 nl Lungs:clear b/l, no cracjle Abdomen:soft, Non-tender,  non-distended Extremities:No edema Dialysis Access: Tunneled catheter and right upper extremity AV fistula.  Dron Prasad Bhandari 05/21/2017,10:51 AM  LOS: 4 days

## 2017-05-22 LAB — BPAM RBC
BLOOD PRODUCT EXPIRATION DATE: 201905202359
Blood Product Expiration Date: 201905192359
Blood Product Expiration Date: 201905222359
ISSUE DATE / TIME: 201904240509
UNIT TYPE AND RH: 6200
UNIT TYPE AND RH: 6200
Unit Type and Rh: 6200

## 2017-05-22 LAB — TYPE AND SCREEN
ABO/RH(D): A POS
Antibody Screen: NEGATIVE
UNIT DIVISION: 0
Unit division: 0
Unit division: 0

## 2017-05-22 LAB — RENAL FUNCTION PANEL
Albumin: 2.5 g/dL — ABNORMAL LOW (ref 3.5–5.0)
Anion gap: 12 (ref 5–15)
BUN: 63 mg/dL — AB (ref 6–20)
CO2: 25 mmol/L (ref 22–32)
Calcium: 8.4 mg/dL — ABNORMAL LOW (ref 8.9–10.3)
Chloride: 101 mmol/L (ref 101–111)
Creatinine, Ser: 3.18 mg/dL — ABNORMAL HIGH (ref 0.61–1.24)
GFR calc Af Amer: 21 mL/min — ABNORMAL LOW (ref >60)
GFR, EST NON AFRICAN AMERICAN: 18 mL/min — AB (ref >60)
Glucose, Bld: 157 mg/dL — ABNORMAL HIGH (ref 65–99)
POTASSIUM: 3.4 mmol/L — AB (ref 3.5–5.1)
Phosphorus: 4.5 mg/dL (ref 2.5–4.6)
Sodium: 138 mmol/L (ref 135–145)

## 2017-05-22 LAB — GLUCOSE, CAPILLARY
GLUCOSE-CAPILLARY: 129 mg/dL — AB (ref 65–99)
Glucose-Capillary: 160 mg/dL — ABNORMAL HIGH (ref 65–99)
Glucose-Capillary: 153 mg/dL — ABNORMAL HIGH (ref 65–99)
Glucose-Capillary: 214 mg/dL — ABNORMAL HIGH (ref 65–99)

## 2017-05-22 LAB — CBC
HEMATOCRIT: 23.6 % — AB (ref 39.0–52.0)
Hemoglobin: 7.4 g/dL — ABNORMAL LOW (ref 13.0–17.0)
MCH: 29.7 pg (ref 26.0–34.0)
MCHC: 31.4 g/dL (ref 30.0–36.0)
MCV: 94.8 fL (ref 78.0–100.0)
PLATELETS: 245 10*3/uL (ref 150–400)
RBC: 2.49 MIL/uL — AB (ref 4.22–5.81)
RDW: 15.5 % (ref 11.5–15.5)
WBC: 8.8 10*3/uL (ref 4.0–10.5)

## 2017-05-22 NOTE — Progress Notes (Signed)
Patient Demographics:    Mitchell Garcia, is a 72 y.o. male, DOB - 06-22-1945, AUQ:[id number]  Admit date - 05/17/2017   Admitting Physician Norval Morton, MD  Outpatient Primary MD for the patient is [name], MD  LOS - 5  Chief Complaint  Patient presents with  . Abnormal Lab  . Chest Pain        Subjective:    [name] today has no fevers, no emesis,  No chest pain, resting comfortably, wife at bedside,  Assessment  & Plan :    Principal Problem:   Acute on chronic combined systolic and diastolic CHF (congestive heart failure) (HCC) Active Problems:   Acute renal failure superimposed on chronic kidney disease (HCC)   NSTEMI (non-ST elevated myocardial infarction) ([name])   CAD S/P percutaneous coronary angioplasty   Uncontrolled diabetes mellitus ([name])   Acute blood loss anemia   Brief Narrative:  72 y.o.malew/ a hx ofCADs/p stents, CHF with EF 35%, CKD stage IV, DM2, CVAwithright-sided weakness,andanemia who was advised to come in by the [place] for abnormal lab work. He noted progressively worsening lower extremity swelling over the last week as well as mild left-sided stabbing chest pain. He had an AV fistula placed 3 weeks prior in the right upper extremity in preparation for hemodialysis. He had also been recently placed on antibiotics for lower extremity redness, but denies any change in symptoms.Denies any significant fever, chills, nausea, vomiting, or diarrhea.  ED labs noted BUN 127, creatinine 4.48.     Plan:- 1)Chronic kidney disease > ESRD-Care as per Nephrology - Next  in-house hemodialysis session on 05/23/2017, outpt tx being arranged  2)Acute on chronic combined systolic and diastolic CHF-last known  EF 35% - volume management now per HD - EDW to be established , continue isosorbide/hydralazine(BiDil)  3)CAD S/P percutaneous coronary angioplasty- No  evidence of acute coronary syndrome at this time - cath 2018 noted severe diffuse CAD - cont aspirin, Plavix, Lipitor  4)Uncontrolled diabetes mellitus-m stable, continue Levemir insulin 18 units twice a day, Use Novolog/Humalog Sliding scale insulin with Accu-Cheks/Fingersticks as ordered   5)Chronic anemia due to CKD-Follow hemoglobin in serial fashion - iron and Aranesp per Nephrology  6)BRBPR- Clinically most consistent with hemorrhoidal bleeding or small fissure - no evidence of hemodynamically significant blood loss at this time - avoid constipation    DVT prophylaxis: SCDs Code Status: FULL CODE Family Communication:   wife at bedside Disposition Plan:   home once outpatient hemodialysis site is established  Consultants:  Nephrology  Cardiology   Significant Events: 4/23 admit  4/25 RIJ Palindrome tunneled HD cath in IR > 1st HD tx   Antimicrobials:  none    DVT Prophylaxis  :  - SCDs   Lab Results  Component Value Date   PLT 245 05/22/2017   Inpatient Medications  Scheduled Meds: . aspirin EC  81 mg Oral Daily  . atorvastatin  80 mg Oral QHS  . calcium acetate  1,334 mg Oral TID WC  . carvedilol  3.125 mg Oral BID WC  . clopidogrel  75 mg Oral Daily  . darbepoetin (ARANESP) injection - DIALYSIS  40 mcg Intravenous Q Fri-HD  . docusate sodium  100 mg Oral BID  . ferrous  sulfate  325 mg Oral BID WC  . gabapentin  300 mg Oral QHS  . guaiFENesin  600 mg Oral BID  . insulin aspart  0-5 Units Subcutaneous QHS  . insulin aspart  0-9 Units Subcutaneous TID WC  . insulin detemir  18 Units Subcutaneous BID  . isosorbide-hydrALAZINE  1 tablet Oral BID  . sodium chloride flush  3 mL Intravenous Q12H  . tuberculin  5 Units Intradermal Once   Continuous Infusions: . sodium chloride     PRN Meds:.sodium chloride, acetaminophen, ALPRAZolam, benzonatate, hydrocortisone, nitroGLYCERIN, ondansetron (ZOFRAN) IV, sodium chloride flush    Anti-infectives (From  admission, onward)   Start     Dose/Rate Route Frequency Ordered Stop   05/19/17 0934  ceFAZolin (ANCEF) 2-4 GM/100ML-% IVPB    Note to Pharmacy:  Idell Pickles   : cabinet override      05/19/17 0934 05/19/17 2144   05/19/17 0800  ceFAZolin (ANCEF) IVPB 2g/100 mL premix     2 g 200 mL/hr over 30 Minutes Intravenous To Radiology 05/19/17 0016 05/19/17 1009        Objective:   Vitals:   05/22/17 0609 05/22/17 0611 05/22/17 0930 05/22/17 1707  BP: 132/81 132/81 138/79 129/77  Pulse: 61 61 83 85  Resp: 19 20 20  (!) 22  Temp: (!) 97.5 F (36.4 C)   98.2 F (36.8 C)  TempSrc: Oral   Axillary  SpO2: 96% 96% 96% 95%  Weight:  102.1 kg (225 lb 1.6 oz)    Height:        Wt Readings from Last 3 Encounters:  05/22/17 102.1 kg (225 lb 1.6 oz)  11/01/16 116.1 kg (256 lb)  10/25/16 115.1 kg (253 lb 12.8 oz)    No intake or output data in the 24 hours ending 05/22/17 1823   Physical Exam  Gen:- Awake Alert, in no acute distress HEENT:- [place].AT, No sclera icterus Neck-Supple Neck,No JVD,.  Right IJ hemodialysis catheter Lungs-  CTAB , good air movement CV- S1, S2 normal,  Abd-  +ve B.Sounds, Abd Soft, No tenderness,    Extremity/Skin:- No  edema,   right wrist AV fistula with bruit and thrill Psych-affect is appropriate, oriented x3 Neuro-no new focal deficits, no tremors   Data Review:   Micro Results No results found for this or any previous visit (from the past 240 hour(s)).  Radiology Reports Dg Chest 2 View  Result Date: 05/17/2017 CLINICAL DATA:  Chest pain.  Renal failure. EXAM: CHEST - 2 VIEW COMPARISON:  October 04, 2016 FINDINGS: There is no edema or consolidation. Heart is mildly enlarged with pulmonary vascularity within normal limits. No adenopathy. There is aortic atherosclerosis. There is an old healed fracture of the proximal right humerus, nonunited. IMPRESSION: Cardiomegaly. No edema or consolidation. There is aortic atherosclerosis. There is a nonunited  old fracture of the right proximal humerus. Aortic Atherosclerosis (ICD10-I70.0). Electronically Signed   By: Lowella Grip III M.D.   On: 05/17/2017 14:42   Ir Fluoro Guide Cv Line Right  Result Date: 05/19/2017 CLINICAL DATA:  Renal failure. Maturing fistula. Needs access for hemodialysis. EXAM: TUNNELED HEMODIALYSIS CATHETER PLACEMENT WITH ULTRASOUND AND FLUOROSCOPIC GUIDANCE TECHNIQUE: The procedure, risks, benefits, and alternatives were explained to the patient. Questions regarding the procedure were encouraged and answered. The patient understands and consents to the procedure. As antibiotic prophylaxis, cefazolin 2 g was ordered pre-procedure and administered intravenously within one hour of incision.Patency of the right IJ vein was confirmed with ultrasound with image  documentation. An appropriate skin site was determined. Region was prepped using maximum barrier technique including cap and mask, sterile gown, sterile gloves, large sterile sheet, and Chlorhexidine as cutaneous antisepsis. The region was infiltrated locally with 1% lidocaine. Intravenous Fentanyl and Versed were administered as conscious sedation during continuous monitoring of the patient's level of consciousness and physiological / cardiorespiratory status by the radiology RN, with a total moderate sedation time of 13 minutes. Under real-time ultrasound guidance, the right IJ vein was accessed with a 21 gauge micropuncture needle; the needle tip within the vein was confirmed with ultrasound image documentation. Needle exchanged over the 018 guidewire for transitional dilator, which allowed advancement of a Benson wire into the IVC. Over this, an MPA catheter was advanced. A Palindrome 19 hemodialysis catheter was tunneled from the right anterior chest wall approach to the right IJ dermatotomy site. The MPA catheter was exchanged over an Amplatz wire for serial vascular dilators which allow placement of a peel-away sheath, through  which the catheter was advanced under intermittent fluoroscopy, positioned with its tips in the proximal and midright atrium. Spot chest radiograph confirms good catheter position. No pneumothorax. Catheter was flushed and primed per protocol. Catheter secured externally with O Prolene sutures. The right IJ dermatotomy site was closed with Dermabond. COMPLICATIONS: COMPLICATIONS None immediate FLUOROSCOPY TIME:  30 seconds; 8 mGy COMPARISON:  None IMPRESSION: 1. Technically successful placement of tunneled right IJ hemodialysis catheter with ultrasound and fluoroscopic guidance. Ready for routine use. Electronically Signed   By: Lucrezia Europe M.D.   On: 05/19/2017 10:12   Ir US Guide Vasc Access Right  Result Date: 05/19/2017 CLINICAL DATA:  Renal failure. Maturing fistula. Needs access for hemodialysis. EXAM: TUNNELED HEMODIALYSIS CATHETER PLACEMENT WITH ULTRASOUND AND FLUOROSCOPIC GUIDANCE TECHNIQUE: The procedure, risks, benefits, and alternatives were explained to the patient. Questions regarding the procedure were encouraged and answered. The patient understands and consents to the procedure. As antibiotic prophylaxis, cefazolin 2 g was ordered pre-procedure and administered intravenously within one hour of incision.Patency of the right IJ vein was confirmed with ultrasound with image documentation. An appropriate skin site was determined. Region was prepped using maximum barrier technique including cap and mask, sterile gown, sterile gloves, large sterile sheet, and Chlorhexidine as cutaneous antisepsis. The region was infiltrated locally with 1% lidocaine. Intravenous Fentanyl and Versed were administered as conscious sedation during continuous monitoring of the patient's level of consciousness and physiological / cardiorespiratory status by the radiology RN, with a total moderate sedation time of 13 minutes. Under real-time ultrasound guidance, the right IJ vein was accessed with a 21 gauge micropuncture  needle; the needle tip within the vein was confirmed with ultrasound image documentation. Needle exchanged over the 018 guidewire for transitional dilator, which allowed advancement of a Benson wire into the IVC. Over this, an MPA catheter was advanced. A Palindrome 19 hemodialysis catheter was tunneled from the right anterior chest wall approach to the right IJ dermatotomy site. The MPA catheter was exchanged over an Amplatz wire for serial vascular dilators which allow placement of a peel-away sheath, through which the catheter was advanced under intermittent fluoroscopy, positioned with its tips in the proximal and midright atrium. Spot chest radiograph confirms good catheter position. No pneumothorax. Catheter was flushed and primed per protocol. Catheter secured externally with O Prolene sutures. The right IJ dermatotomy site was closed with Dermabond. COMPLICATIONS: COMPLICATIONS None immediate FLUOROSCOPY TIME:  30 seconds; 8 mGy COMPARISON:  None IMPRESSION: 1. Technically successful placement of tunneled right IJ  hemodialysis catheter with ultrasound and fluoroscopic guidance. Ready for routine use. Electronically Signed   By: Lucrezia Europe M.D.   On: 05/19/2017 10:12     CBC Recent Labs  Lab 05/18/17 0254 05/18/17 1441 05/19/17 0230 05/20/17 0255 05/21/17 0746 05/22/17 0231  WBC 10.0 9.8 11.0* 8.9 7.9 8.8  HGB 6.9* 7.9* 7.6* 7.3* 7.7* 7.4*  HCT 21.7* 24.9* 24.3* 22.8* 25.0* 23.6*  PLT 246 238 243 237 229 245  MCV 92.7 93.3 92.4 92.7 94.3 94.8  MCH 29.5 29.6 28.9 29.7 29.1 29.7  MCHC 31.8 31.7 31.3 32.0 30.8 31.4  RDW 15.0 15.1 15.3 14.9 15.5 15.5  LYMPHSABS 0.9 0.8  --  0.8  --   --   MONOABS 1.5* 1.6*  --  1.2*  --   --   EOSABS 0.2 0.2  --  0.3  --   --   BASOSABS 0.0 0.0  --  0.0  --   --     Chemistries  Recent Labs  Lab 05/18/17 0254 05/19/17 0230 05/20/17 0255 05/21/17 0746 05/22/17 0231  NA 135 138 140 136 138  K 3.9 3.8 3.4* 3.7 3.4*  CL 102 103 103 99* 101  CO2  16* 19* 22 25 25   GLUCOSE 320* 233* 121* 114* 157*  BUN 133* 132* 91* 56* 63*  CREATININE 4.49* 4.44* 3.37* 2.67* 3.18*  CALCIUM 7.8* 8.1*  8.2* 8.0* 8.2* 8.4*   ------------------------------------------------------------------------------------------------------------------ No results for input(s): CHOL, HDL, LDLCALC, TRIG, CHOLHDL, LDLDIRECT in the last 72 hours.  Lab Results  Component Value Date   HGBA1C 7.6 (H) 10/05/2016   ------------------------------------------------------------------------------------------------------------------ No results for input(s): TSH, T4TOTAL, T3FREE, THYROIDAB in the last 72 hours.  Invalid input(s): FREET3 ------------------------------------------------------------------------------------------------------------------ No results for input(s): VITAMINB12, FOLATE, FERRITIN, TIBC, IRON, RETICCTPCT in the last 72 hours.  Coagulation profile Recent Labs  Lab 05/19/17 0230  INR 1.20    No results for input(s): DDIMER in the last 72 hours.  Cardiac Enzymes Recent Labs  Lab 05/17/17 2052 05/18/17 0254 05/18/17 0903  TROPONINI 0.97* 1.05* 0.86*   ------------------------------------------------------------------------------------------------------------------    Component Value Date/Time   BNP 754.3 (H) 05/17/2017 1550     Hesper Venturella M.D on 05/22/2017 at 6:23 PM  Between 7am to 7pm - Pager - [phone]  After 7pm go to [email or web address] - password [email or web address]  [facility] -  Office  [phone]   Voice Recognition Viviann Spare dictation system was used to create this note, attempts have been made to correct errors. Please contact the author with questions and/or clarifications.

## 2017-05-22 NOTE — Progress Notes (Signed)
[facility] NEPHROLOGY PROGRESS NOTE  Assessment/ Plan: Pt is a 72 y.o. yo male  with history of hypertension, chronic kidney disease stage IV/V follows up with nephrologist at [facility], has right upper extremity AV fistula placed about 3 weeks ago, history of CHF, anemia, sent by his nephrologist because of worsening renal function, SOB and edema. He has acute CHF exacerbation. He has progressive CKD stage V now  ESRD in the setting of fluid overload. Status post tunneled catheter placement today by IR on 4/25. HD initiated on 4/25.  Assessment/Plan:  #CKD stage V progressed to ESRD: HD on 4/25, 4/26, tolerated well. Tunnel catheter is working well. Patient has AV fistula maturing. Plan for hemodialysis tomorrow. CLIP is in progress at [place] due to chronic kidney disease: Iron stores acceptable. Continue aranesp. No sign of bleeding.  Hemoglobin 7.4 today.  #Secondary hyperparathyroidism: Phosphorus level trending down. Continue PhosLo. PTH 428.[name]  #Hypertension: Blood pressure acceptable. On cardiac medication.  Subjective: Seen and examined at bedside.  Denied nausea vomiting chest pain shortness of breath.  Objective Vital signs in last 24 hours: Vitals:   05/21/17 1924 05/22/17 0609 05/22/17 0611 05/22/17 0930  BP: 133/64 132/81 132/81 138/79  Pulse: 74 61 61 83  Resp: (!) 22 19 20 20   Temp: 98.2 F (36.8 C) (!) 97.5 F (36.4 C)    TempSrc: Oral Oral    SpO2: 97% 96% 96% 96%  Weight:   102.1 kg (225 lb 1.6 oz)   Height:       Weight change: -2.395 kg (-5 lb 4.5 oz) No intake or output data in the 24 hours ending 05/22/17 0957     Labs: Basic Metabolic Panel: Recent Labs  Lab 05/20/17 0255 05/21/17 0746 05/22/17 0231  NA 140 136 138  K 3.4* 3.7 3.4*  CL 103 99* 101  CO2 22 25 25   GLUCOSE 121* 114* 157*  BUN 91* 56* 63*  CREATININE 3.37* 2.67* 3.18*  CALCIUM 8.0* 8.2* 8.4*  PHOS 6.9* 4.8* 4.5   Liver Function Tests: Recent Labs   Lab 05/20/17 0255 05/21/17 0746 05/22/17 0231  ALBUMIN 2.4* 2.5* 2.5*   No results for input(s): LIPASE, AMYLASE in the last 168 hours. No results for input(s): AMMONIA in the last 168 hours. CBC: Recent Labs  Lab 05/18/17 0254 05/18/17 1441 05/19/17 0230 05/20/17 0255 05/21/17 0746 05/22/17 0231  WBC 10.0 9.8 11.0* 8.9 7.9 8.8  NEUTROABS 7.4 7.2  --  6.7  --   --   HGB 6.9* 7.9* 7.6* 7.3* 7.7* 7.4*  HCT 21.7* 24.9* 24.3* 22.8* 25.0* 23.6*  MCV 92.7 93.3 92.4 92.7 94.3 94.8  PLT 246 238 243 237 229 245   Cardiac Enzymes: Recent Labs  Lab 05/17/17 2052 05/18/17 0254 05/18/17 0903  TROPONINI 0.97* 1.05* 0.86*   CBG: Recent Labs  Lab 05/21/17 0631 05/21/17 1124 05/21/17 1642 05/21/17 2152 05/22/17 0606  GLUCAP 115* 191* 186* 141* 129*    Iron Studies:  No results for input(s): IRON, TIBC, TRANSFERRIN, FERRITIN in the last 72 hours. Studies/Results: No results found.  Medications: Infusions: . sodium chloride      Scheduled Medications: . aspirin EC  81 mg Oral Daily  . atorvastatin  80 mg Oral QHS  . calcium acetate  1,334 mg Oral TID WC  . carvedilol  3.125 mg Oral BID WC  . clopidogrel  75 mg Oral Daily  . darbepoetin (ARANESP) injection - DIALYSIS  40 mcg Intravenous Q Fri-HD  .  docusate sodium  100 mg Oral BID  . ferrous sulfate  325 mg Oral BID WC  . gabapentin  300 mg Oral QHS  . guaiFENesin  600 mg Oral BID  . insulin aspart  0-5 Units Subcutaneous QHS  . insulin aspart  0-9 Units Subcutaneous TID WC  . insulin detemir  18 Units Subcutaneous BID  . isosorbide-hydrALAZINE  1 tablet Oral BID  . sodium chloride flush  3 mL Intravenous Q12H  . tuberculin  5 Units Intradermal Once    have reviewed scheduled and prn medications.  Physical Exam: General: Pleasant male, not in distress Heart:RRR, s1s2 nl Lungs: Clear bilateral, no crackle Abdomen:soft, Non-tender, non-distended Extremities: No edema Dialysis Access: Tunneled catheter and  right upper extremity AV fistula with good bruit and thrill.  Kimberley Speece Prasad Karna Abed 05/22/2017,9:57 AM  LOS: 5 days

## 2017-05-23 LAB — GLUCOSE, CAPILLARY
GLUCOSE-CAPILLARY: 143 mg/dL — AB (ref 65–99)
Glucose-Capillary: 216 mg/dL — ABNORMAL HIGH (ref 65–99)
Glucose-Capillary: 118 mg/dL — ABNORMAL HIGH (ref 65–99)
Glucose-Capillary: 195 mg/dL — ABNORMAL HIGH (ref 65–99)

## 2017-05-23 LAB — RENAL FUNCTION PANEL
Albumin: 2.5 g/dL — ABNORMAL LOW (ref 3.5–5.0)
Anion gap: 13 (ref 5–15)
BUN: 68 mg/dL — AB (ref 6–20)
CALCIUM: 8.6 mg/dL — AB (ref 8.9–10.3)
CO2: 24 mmol/L (ref 22–32)
CREATININE: 3.44 mg/dL — AB (ref 0.61–1.24)
Chloride: 103 mmol/L (ref 101–111)
GFR calc non Af Amer: 16 mL/min — ABNORMAL LOW (ref >60)
GFR, EST AFRICAN AMERICAN: 19 mL/min — AB (ref >60)
Glucose, Bld: 166 mg/dL — ABNORMAL HIGH (ref 65–99)
Phosphorus: 5.6 mg/dL — ABNORMAL HIGH (ref 2.5–4.6)
Potassium: 3.7 mmol/L (ref 3.5–5.1)
Sodium: 140 mmol/L (ref 135–145)

## 2017-05-23 LAB — CBC
HEMATOCRIT: 24.9 % — AB (ref 39.0–52.0)
HEMOGLOBIN: 7.7 g/dL — AB (ref 13.0–17.0)
MCH: 30.2 pg (ref 26.0–34.0)
MCHC: 30.9 g/dL (ref 30.0–36.0)
MCV: 97.6 fL (ref 78.0–100.0)
Platelets: 253 10*3/uL (ref 150–400)
RBC: 2.55 MIL/uL — AB (ref 4.22–5.81)
RDW: 16.2 % — ABNORMAL HIGH (ref 11.5–15.5)
WBC: 10.2 10*3/uL (ref 4.0–10.5)

## 2017-05-23 MED ORDER — HEPARIN SODIUM (PORCINE) 1000 UNIT/ML DIALYSIS: 1000.0000 [IU] | INTRAMUSCULAR | Status: DC | PRN

## 2017-05-23 MED ORDER — DARBEPOETIN ALFA 200 MCG/0.4ML IJ SOSY: 200.0000 ug | PREFILLED_SYRINGE | INTRAMUSCULAR | Status: DC

## 2017-05-23 MED ORDER — ALTEPLASE 2 MG IJ SOLR: 2.0000 mg | Freq: Once | INTRAMUSCULAR | Status: DC | PRN

## 2017-05-23 MED ORDER — SODIUM CHLORIDE 0.9 % IV SOLN: 100.0000 mL | INTRAVENOUS | Status: DC | PRN

## 2017-05-23 MED ORDER — PENTAFLUOROPROP-TETRAFLUOROETH EX AERO: 1.0000 "application " | INHALATION_SPRAY | CUTANEOUS | Status: DC | PRN

## 2017-05-23 MED ORDER — RENA-VITE PO TABS
1.0000 | ORAL_TABLET | Freq: Every day | ORAL | Status: DC
  Administered 2017-05-23: 1 via ORAL
  Filled 2017-05-23: qty 1

## 2017-05-23 MED ORDER — CALCITRIOL 0.25 MCG PO CAPS: 0.2500 ug | ORAL_CAPSULE | Freq: Every day | ORAL | Status: DC

## 2017-05-23 MED ORDER — HEPARIN SODIUM (PORCINE) 1000 UNIT/ML DIALYSIS
40.0000 [IU]/kg | Freq: Once | INTRAMUSCULAR | Status: DC
  Filled 2017-05-23: qty 5

## 2017-05-23 MED ORDER — LIDOCAINE HCL (PF) 1 % IJ SOLN: 5.0000 mL | INTRAMUSCULAR | Status: DC | PRN

## 2017-05-23 MED ORDER — CALCITRIOL 0.25 MCG PO CAPS
0.5000 ug | ORAL_CAPSULE | Freq: Every day | ORAL | Status: DC
Start: 2017-05-24 — End: 2017-05-24

## 2017-05-23 MED ORDER — LIDOCAINE-PRILOCAINE 2.5-2.5 % EX CREA: 1.0000 "application " | TOPICAL_CREAM | CUTANEOUS | Status: DC | PRN

## 2017-05-23 MED ORDER — HEPARIN SODIUM (PORCINE) 1000 UNIT/ML DIALYSIS
1000.0000 [IU] | INTRAMUSCULAR | Status: DC | PRN
  Filled 2017-05-23: qty 1

## 2017-05-23 NOTE — Consult Note (Signed)
            [facility] CM Primary Care Navigator  05/23/2017  Mitchell Garcia 09-23-1945 [id number]   Attemptto see patient at the bedside to identify possible discharge needs but noted on my previous note that patient's primary care provider is not under [facility] ([place]). Reverified eligibility with [facility] CMA, but states that per [employer] (All Payers List),  patient is not covered, and also his PCP- primary care provider is not covered.   Patient's [facility] not under [facility] network. Wife had previously reported that patient has not been seen Dr. Bluford Kaufmann with [facility] [place] for several years now.  For additional questions please contact:  Edwena Felty A. Landry Kamath, BSN, RN-BC [facility] PRIMARY CARE Navigator Cell: [phone]

## 2017-05-23 NOTE — Procedures (Signed)
I was present at this session.  I have reviewed the session itself and made appropriate changes.  HD via PC, will ^ UFR, as congested.  bp ok. 3rd tx  Mauricia Area 4/29/20192:13 PM

## 2017-05-23 NOTE — Progress Notes (Signed)
Patient Demographics:    Mitchell Garcia, is a 72 y.o. male, DOB - January 17, 1946, GMW:[id number]  Admit date - 05/17/2017   Admitting Physician Norval Morton, MD  Outpatient Primary MD for the patient is [name], MD  LOS - 6  Chief Complaint  Patient presents with  . Abnormal Lab  . Chest Pain        Subjective:    Mitchell Garcia today has no fevers, no emesis,  No chest pain, for hemodialysis 05/23/2017  Assessment  & Plan :    Principal Problem:   Acute on chronic combined systolic and diastolic CHF (congestive heart failure) (HCC) Active Problems:   Acute renal failure superimposed on chronic kidney disease ([place])   NSTEMI (non-ST elevated myocardial infarction) ([place])   CAD S/P percutaneous coronary angioplasty   Uncontrolled diabetes mellitus ([place])   Acute blood loss anemia   Brief Narrative:  72 y.o.malew/ a hx ofCADs/p stents, CHF with EF 35%, CKD stage IV, DM2, CVAwithright-sided weakness,andanemia who was advised to come in by the [place] for abnormal lab work. He noted progressively worsening lower extremity swelling over the last week as well as mild left-sided stabbing chest pain. He had an AV fistula placed 3 weeks prior in the right upper extremity in preparation for hemodialysis. He had also been recently placed on antibiotics for lower extremity redness, but denies any change in symptoms.Denies any significant fever, chills, nausea, vomiting, or diarrhea.  ED labs noted BUN 127, creatinine 4.48.     Plan:- 1)Chronic kidney disease > ESRD-Care as per Nephrology - had  hemodialysis session on 05/23/2017, outpt tx being arranged thru the [facility] facility in [place]  2)Acute on chronic combined systolic and diastolic CHF-last known  EF 35% - volume management now per HD - EDW to be established , continue isosorbide/hydralazine(BiDil), continue Coreg  3)CAD S/P  percutaneous coronary angioplasty- No evidence of acute coronary syndrome at this time - cath 2018 noted severe diffuse CAD - cont aspirin, Plavix, Lipitor and Coreg  4)Uncontrolled diabetes mellitus-m stable, continue Levemir insulin 18 units twice a day, Use Novolog/Humalog Sliding scale insulin with Accu-Cheks/Fingersticks as ordered   5)Chronic anemia due to CKD-Follow hemoglobin in serial fashion - iron and Aranesp per Nephrology, hemoglobin is 7.7  6)BRBPR- Clinically most consistent with hemorrhoidal bleeding or small fissure - no evidence of hemodynamically significant blood loss at this time - avoid constipation    DVT prophylaxis: SCDs Code Status: FULL CODE Family Communication:   wife at bedside Disposition Plan:   home [facility] [place]  Consultants:  Nephrology  Cardiology   Significant Events: 4/23 admit  4/25 RIJ Palindrome tunneled HD cath in IR > 1st HD tx   Antimicrobials:  none    DVT Prophylaxis  :  - SCDs   Lab Results  Component Value Date   PLT 253 05/23/2017   Inpatient Medications  Scheduled Meds: . aspirin EC  81 mg Oral Daily  . atorvastatin  80 mg Oral QHS  . [START ON 05/24/2017] calcitRIOL  0.5 mcg Oral Daily  . calcium acetate  1,334 mg Oral TID WC  . carvedilol  3.125 mg Oral BID WC  . clopidogrel  75 mg Oral  Daily  . [START ON 05/27/2017] darbepoetin (ARANESP) injection - DIALYSIS  200 mcg Intravenous Q Fri-HD  . docusate sodium  100 mg Oral BID  . gabapentin  300 mg Oral QHS  . guaiFENesin  600 mg Oral BID  . heparin  40 Units/kg Dialysis Once in dialysis  . insulin aspart  0-5 Units Subcutaneous QHS  . insulin aspart  0-9 Units Subcutaneous TID WC  . insulin detemir  18 Units Subcutaneous BID  . isosorbide-hydrALAZINE  1 tablet Oral BID  . multivitamin  1 tablet Oral QHS  . sodium chloride flush  3 mL Intravenous Q12H   Continuous Infusions: . sodium chloride    .  sodium chloride    . sodium chloride     PRN Meds:.sodium chloride, sodium chloride, sodium chloride, acetaminophen, ALPRAZolam, alteplase, benzonatate, heparin, hydrocortisone, lidocaine (PF), lidocaine-prilocaine, nitroGLYCERIN, ondansetron (ZOFRAN) IV, pentafluoroprop-tetrafluoroeth, sodium chloride flush    Anti-infectives (From admission, onward)   Start     Dose/Rate Route Frequency Ordered Stop   05/19/17 0934  ceFAZolin (ANCEF) 2-4 GM/100ML-% IVPB    Note to Pharmacy:  Idell Pickles   : cabinet override      05/19/17 0934 05/19/17 2144   05/19/17 0800  ceFAZolin (ANCEF) IVPB 2g/100 mL premix     2 g 200 mL/hr over 30 Minutes Intravenous To Radiology 05/19/17 0016 05/19/17 1009        Objective:   Vitals:   05/23/17 1600 05/23/17 1630 05/23/17 1700 05/23/17 1730  BP: 131/60 116/64 (!) 126/59 (!) 134/55  Pulse: 63 68 72 73  Resp:      Temp:      TempSrc:      SpO2:      Weight:      Height:        Wt Readings from Last 3 Encounters:  05/23/17 102.7 kg (226 lb 6.6 oz)  11/01/16 116.1 kg (256 lb)  10/25/16 115.1 kg (253 lb 12.8 oz)     Intake/Output Summary (Last 24 hours) at 05/23/2017 1825 Last data filed at 05/23/2017 1230 Gross per 24 hour  Intake 702 ml  Output 300 ml  Net 402 ml     Physical Exam  Gen:- Awake Alert, in no acute distress HEENT:- [place].AT, No sclera icterus Neck-Supple Neck,No JVD,.  Right IJ hemodialysis catheter Lungs-  CTAB , good air movement CV- S1, S2 normal,  Abd-  +ve B.Sounds, Abd Soft, No tenderness,    Extremity/Skin:- No  edema,   right wrist AV fistula with bruit and thrill Psych-affect is appropriate, oriented x3 Neuro-no new focal deficits, no tremors   Data Review:   Micro Results No results found for this or any previous visit (from the past 240 hour(s)).  Radiology Reports Dg Chest 2 View  Result Date: 05/17/2017 CLINICAL DATA:  Chest pain.  Renal failure. EXAM: CHEST - 2 VIEW COMPARISON:  October 04, 2016  FINDINGS: There is no edema or consolidation. Heart is mildly enlarged with pulmonary vascularity within normal limits. No adenopathy. There is aortic atherosclerosis. There is an old healed fracture of the proximal right humerus, nonunited. IMPRESSION: Cardiomegaly. No edema or consolidation. There is aortic atherosclerosis. There is a nonunited old fracture of the right proximal humerus. Aortic Atherosclerosis (ICD10-I70.0). Electronically Signed   By: Lowella Grip III M.D.   On: 05/17/2017 14:42   Ir Fluoro Guide Cv Line Right  Result Date: 05/19/2017 CLINICAL DATA:  Renal failure. Maturing fistula. Needs access for hemodialysis. EXAM: TUNNELED HEMODIALYSIS CATHETER PLACEMENT  WITH ULTRASOUND AND FLUOROSCOPIC GUIDANCE TECHNIQUE: The procedure, risks, benefits, and alternatives were explained to the patient. Questions regarding the procedure were encouraged and answered. The patient understands and consents to the procedure. As antibiotic prophylaxis, cefazolin 2 g was ordered pre-procedure and administered intravenously within one hour of incision.Patency of the right IJ vein was confirmed with ultrasound with image documentation. An appropriate skin site was determined. Region was prepped using maximum barrier technique including cap and mask, sterile gown, sterile gloves, large sterile sheet, and Chlorhexidine as cutaneous antisepsis. The region was infiltrated locally with 1% lidocaine. Intravenous Fentanyl and Versed were administered as conscious sedation during continuous monitoring of the patient's level of consciousness and physiological / cardiorespiratory status by the radiology RN, with a total moderate sedation time of 13 minutes. Under real-time ultrasound guidance, the right IJ vein was accessed with a 21 gauge micropuncture needle; the needle tip within the vein was confirmed with ultrasound image documentation. Needle exchanged over the 018 guidewire for transitional dilator, which allowed  advancement of a Benson wire into the IVC. Over this, an MPA catheter was advanced. A Palindrome 19 hemodialysis catheter was tunneled from the right anterior chest wall approach to the right IJ dermatotomy site. The MPA catheter was exchanged over an Amplatz wire for serial vascular dilators which allow placement of a peel-away sheath, through which the catheter was advanced under intermittent fluoroscopy, positioned with its tips in the proximal and midright atrium. Spot chest radiograph confirms good catheter position. No pneumothorax. Catheter was flushed and primed per protocol. Catheter secured externally with O Prolene sutures. The right IJ dermatotomy site was closed with Dermabond. COMPLICATIONS: COMPLICATIONS None immediate FLUOROSCOPY TIME:  30 seconds; 8 mGy COMPARISON:  None IMPRESSION: 1. Technically successful placement of tunneled right IJ hemodialysis catheter with ultrasound and fluoroscopic guidance. Ready for routine use. Electronically Signed   By: Lucrezia Europe M.D.   On: 05/19/2017 10:12   Ir US Guide Vasc Access Right  Result Date: 05/19/2017 CLINICAL DATA:  Renal failure. Maturing fistula. Needs access for hemodialysis. EXAM: TUNNELED HEMODIALYSIS CATHETER PLACEMENT WITH ULTRASOUND AND FLUOROSCOPIC GUIDANCE TECHNIQUE: The procedure, risks, benefits, and alternatives were explained to the patient. Questions regarding the procedure were encouraged and answered. The patient understands and consents to the procedure. As antibiotic prophylaxis, cefazolin 2 g was ordered pre-procedure and administered intravenously within one hour of incision.Patency of the right IJ vein was confirmed with ultrasound with image documentation. An appropriate skin site was determined. Region was prepped using maximum barrier technique including cap and mask, sterile gown, sterile gloves, large sterile sheet, and Chlorhexidine as cutaneous antisepsis. The region was infiltrated locally with 1% lidocaine. Intravenous  Fentanyl and Versed were administered as conscious sedation during continuous monitoring of the patient's level of consciousness and physiological / cardiorespiratory status by the radiology RN, with a total moderate sedation time of 13 minutes. Under real-time ultrasound guidance, the right IJ vein was accessed with a 21 gauge micropuncture needle; the needle tip within the vein was confirmed with ultrasound image documentation. Needle exchanged over the 018 guidewire for transitional dilator, which allowed advancement of a Benson wire into the IVC. Over this, an MPA catheter was advanced. A Palindrome 19 hemodialysis catheter was tunneled from the right anterior chest wall approach to the right IJ dermatotomy site. The MPA catheter was exchanged over an Amplatz wire for serial vascular dilators which allow placement of a peel-away sheath, through which the catheter was advanced under intermittent fluoroscopy, positioned with its tips  in the proximal and midright atrium. Spot chest radiograph confirms good catheter position. No pneumothorax. Catheter was flushed and primed per protocol. Catheter secured externally with O Prolene sutures. The right IJ dermatotomy site was closed with Dermabond. COMPLICATIONS: COMPLICATIONS None immediate FLUOROSCOPY TIME:  30 seconds; 8 mGy COMPARISON:  None IMPRESSION: 1. Technically successful placement of tunneled right IJ hemodialysis catheter with ultrasound and fluoroscopic guidance. Ready for routine use. Electronically Signed   By: Lucrezia Europe M.D.   On: 05/19/2017 10:12     CBC Recent Labs  Lab 05/18/17 0254 05/18/17 1441 05/19/17 0230 05/20/17 0255 05/21/17 0746 05/22/17 0231 05/23/17 0648  WBC 10.0 9.8 11.0* 8.9 7.9 8.8 10.2  HGB 6.9* 7.9* 7.6* 7.3* 7.7* 7.4* 7.7*  HCT 21.7* 24.9* 24.3* 22.8* 25.0* 23.6* 24.9*  PLT 246 238 243 237 229 245 253  MCV 92.7 93.3 92.4 92.7 94.3 94.8 97.6  MCH 29.5 29.6 28.9 29.7 29.1 29.7 30.2  MCHC 31.8 31.7 31.3 32.0 30.8  31.4 30.9  RDW 15.0 15.1 15.3 14.9 15.5 15.5 16.2*  LYMPHSABS 0.9 0.8  --  0.8  --   --   --   MONOABS 1.5* 1.6*  --  1.2*  --   --   --   EOSABS 0.2 0.2  --  0.3  --   --   --   BASOSABS 0.0 0.0  --  0.0  --   --   --     Chemistries  Recent Labs  Lab 05/19/17 0230 05/20/17 0255 05/21/17 0746 05/22/17 0231 05/23/17 0307  NA 138 140 136 138 140  K 3.8 3.4* 3.7 3.4* 3.7  CL 103 103 99* 101 103  CO2 19* 22 25 25 24   GLUCOSE 233* 121* 114* 157* 166*  BUN 132* 91* 56* 63* 68*  CREATININE 4.44* 3.37* 2.67* 3.18* 3.44*  CALCIUM 8.1*  8.2* 8.0* 8.2* 8.4* 8.6*   ------------------------------------------------------------------------------------------------------------------ No results for input(s): CHOL, HDL, LDLCALC, TRIG, CHOLHDL, LDLDIRECT in the last 72 hours.  Lab Results  Component Value Date   HGBA1C 7.6 (H) 10/05/2016   ------------------------------------------------------------------------------------------------------------------ No results for input(s): TSH, T4TOTAL, T3FREE, THYROIDAB in the last 72 hours.  Invalid input(s): FREET3 ------------------------------------------------------------------------------------------------------------------ No results for input(s): VITAMINB12, FOLATE, FERRITIN, TIBC, IRON, RETICCTPCT in the last 72 hours.  Coagulation profile Recent Labs  Lab 05/19/17 0230  INR 1.20    No results for input(s): DDIMER in the last 72 hours.  Cardiac Enzymes Recent Labs  Lab 05/17/17 2052 05/18/17 0254 05/18/17 0903  TROPONINI 0.97* 1.05* 0.86*   ------------------------------------------------------------------------------------------------------------------    Component Value Date/Time   BNP 754.3 (H) 05/17/2017 1550     Landry Kamath M.D on 05/23/2017 at 6:25 PM  Between 7am to 7pm - Pager - [phone]  After 7pm go to [email or web address] - password [email or web address]  [facility] -  Office  [phone]   Voice Recognition  Viviann Spare dictation system was used to create this note, attempts have been made to correct errors. Please contact the author with questions and/or clarifications.

## 2017-05-23 NOTE — Progress Notes (Addendum)
[facility] Progress Note    Assessment/ Plan:   Patient is a 72 year old male presenting with SOB and LE edema secondary to [facility] found to have worsening CKD stage V.  Patient has progressed to ESRD and now requires dialysis s/p tunneled catheter by IR on 4/25.  Cardiology is following.  1.  CKD Stage V now ESRD: Tunneled catheter working appropriately.  AV fistula maturing.  Patient tolerating HD 4/25, 4/26.  Plan for HD later today.  CLIP is in progress at [place]. Needs lower vol.   2.  Anemia of CKD: Iron sat 79%.  Continuing Aranesp every Friday.  On iron 325 mg BID with meals.  No signs of active bleeding.  Trending hemoglobin.  On Plavix.  3.  Secondary hyperparathyroidism: PTH 428.  Continue PhosLo 3 times daily with meals.  Calcitriol 0.25 mcg daily.  4.  Hypertension: Normotensive 130s/70s.  Antihypertensives include carvedilol and BiDil.' 5 DM  Subjective:   Patient is doing well this morning. No complaints.   Objective:   BP 136/73 (BP Location: Left Arm)   Pulse 83   Temp 98.2 F (36.8 C) (Oral)   Resp (!) 24   Ht 6' (1.829 m)   Wt 223 lb 8.7 oz (101.4 kg)   SpO2 96%   BMI 30.32 kg/m   Intake/Output Summary (Last 24 hours) at 05/23/2017 8841 Last data filed at 05/23/2017 0023 Gross per 24 hour  Intake 222 ml  Output 300 ml  Net -78 ml   Weight change: -1 lb 8.9 oz (-0.705 kg)  Physical Exam: General: elderly male, well developed, NAD with non-toxic appearance HEENT: normocephalic, atraumatic, moist mucous membranes Neck: supple, no JVD Cardiovascular: regular rate and rhythm without murmurs, rubs, or gallops, R-AVF maturing with thrill, RIJ intact Gr2/6 SEM Lungs: clear to auscultation bilaterally with normal work of breathing decreased bs Abdomen: soft, non-tender, non-distended, normoactive bowel sounds Obese, liver down 5 cm Skin: warm, dry, no rashes or lesions, cap refill < 2 seconds Extremities: warm and well perfused, normal tone, 1+  pitting edema up to mid shin bilaterally AVF  Imaging: No results found.  Labs: BMET Recent Labs  Lab 05/17/17 1550 05/18/17 0254 05/19/17 0230 05/20/17 0255 05/21/17 0746 05/22/17 0231 05/23/17 0307  NA 138 135 138 140 136 138 140  K 4.4 3.9 3.8 3.4* 3.7 3.4* 3.7  CL 104 102 103 103 99* 101 103  CO2 15* 16* 19* 22 25 25 24   GLUCOSE 338* 320* 233* 121* 114* 157* 166*  BUN 127* 133* 132* 91* 56* 63* 68*  CREATININE 4.48* 4.49* 4.44* 3.37* 2.67* 3.18* 3.44*  CALCIUM 8.1* 7.8* 8.1*  8.2* 8.0* 8.2* 8.4* 8.6*  PHOS  --   --  10.6* 6.9* 4.8* 4.5 5.6*   CBC Recent Labs  Lab 05/18/17 0254 05/18/17 1441 05/19/17 0230 05/20/17 0255 05/21/17 0746 05/22/17 0231  WBC 10.0 9.8 11.0* 8.9 7.9 8.8  NEUTROABS 7.4 7.2  --  6.7  --   --   HGB 6.9* 7.9* 7.6* 7.3* 7.7* 7.4*  HCT 21.7* 24.9* 24.3* 22.8* 25.0* 23.6*  MCV 92.7 93.3 92.4 92.7 94.3 94.8  PLT 246 238 243 237 229 245    Medications:    . aspirin EC  81 mg Oral Daily  . atorvastatin  80 mg Oral QHS  . calcium acetate  1,334 mg Oral TID WC  . carvedilol  3.125 mg Oral BID WC  . clopidogrel  75 mg Oral Daily  . darbepoetin (ARANESP)  injection - DIALYSIS  40 mcg Intravenous Q Fri-HD  . docusate sodium  100 mg Oral BID  . ferrous sulfate  325 mg Oral BID WC  . gabapentin  300 mg Oral QHS  . guaiFENesin  600 mg Oral BID  . insulin aspart  0-5 Units Subcutaneous QHS  . insulin aspart  0-9 Units Subcutaneous TID WC  . insulin detemir  18 Units Subcutaneous BID  . isosorbide-hydrALAZINE  1 tablet Oral BID  . sodium chloride flush  3 mL Intravenous Q12H  . tuberculin  5 Units Intradermal Once      Harriet Butte, DO, PGY-2 I have seen and examined this patient and agree with the plan of care seen, eval, examined, discussed with resident, patient and wife .  Jeneen Rinks Dameka Younker 05/23/2017, 11:59 AM

## 2017-05-23 NOTE — Progress Notes (Signed)
Progress Note  Patient Name: Mitchell Garcia Date of Encounter: 05/23/2017  Primary Cardiologist:   Lauree Chandler, MD   Subjective   No chest pain.  Coughing up clear sputum.   Inpatient Medications    Scheduled Meds: . aspirin EC  81 mg Oral Daily  . atorvastatin  80 mg Oral QHS  . calcium acetate  1,334 mg Oral TID WC  . carvedilol  3.125 mg Oral BID WC  . clopidogrel  75 mg Oral Daily  . darbepoetin (ARANESP) injection - DIALYSIS  40 mcg Intravenous Q Fri-HD  . docusate sodium  100 mg Oral BID  . ferrous sulfate  325 mg Oral BID WC  . gabapentin  300 mg Oral QHS  . guaiFENesin  600 mg Oral BID  . insulin aspart  0-5 Units Subcutaneous QHS  . insulin aspart  0-9 Units Subcutaneous TID WC  . insulin detemir  18 Units Subcutaneous BID  . isosorbide-hydrALAZINE  1 tablet Oral BID  . sodium chloride flush  3 mL Intravenous Q12H  . tuberculin  5 Units Intradermal Once   Continuous Infusions: . sodium chloride     PRN Meds: sodium chloride, acetaminophen, ALPRAZolam, benzonatate, hydrocortisone, nitroGLYCERIN, ondansetron (ZOFRAN) IV, sodium chloride flush   Vital Signs    Vitals:   05/22/17 0930 05/22/17 1707 05/22/17 2108 05/23/17 0345  BP: 138/79 129/77 136/73   Pulse: 83 85 83   Resp: 20 (!) 22 (!) 24   Temp:  98.2 F (36.8 C) 98.2 F (36.8 C) 98.2 F (36.8 C)  TempSrc:  Axillary Oral Oral  SpO2: 96% 95% 96%   Weight:    223 lb 8.7 oz (101.4 kg)  Height:        Intake/Output Summary (Last 24 hours) at 05/23/2017 0828 Last data filed at 05/23/2017 0023 Gross per 24 hour  Intake 222 ml  Output 300 ml  Net -78 ml   Filed Weights   05/21/17 0655 05/22/17 0611 05/23/17 0345  Weight: 227 lb 15.3 oz (103.4 kg) 225 lb 1.6 oz (102.1 kg) 223 lb 8.7 oz (101.4 kg)    Telemetry    NSR, PVCs - Personally Reviewed  ECG    NA - Personally Reviewed  Physical Exam   GEN: No acute distress.   Neck: No  JVD Cardiac: RRR, no murmurs, rubs, or  gallops.  Respiratory:   Diffuse course breath sounds GI: Soft, nontender, non-distended  MS:   Trace edema; No deformity. Neuro:  Nonfocal  Psych: Normal affect   Labs    Chemistry Recent Labs  Lab 05/21/17 0746 05/22/17 0231 05/23/17 0307  NA 136 138 140  K 3.7 3.4* 3.7  CL 99* 101 103  CO2 25 25 24   GLUCOSE 114* 157* 166*  BUN 56* 63* 68*  CREATININE 2.67* 3.18* 3.44*  CALCIUM 8.2* 8.4* 8.6*  ALBUMIN 2.5* 2.5* 2.5*  GFRNONAA 22* 18* 16*  GFRAA 26* 21* 19*  ANIONGAP 12 12 13      Hematology Recent Labs  Lab 05/21/17 0746 05/22/17 0231 05/23/17 0648  WBC 7.9 8.8 10.2  RBC 2.65* 2.49* 2.55*  HGB 7.7* 7.4* 7.7*  HCT 25.0* 23.6* 24.9*  MCV 94.3 94.8 97.6  MCH 29.1 29.7 30.2  MCHC 30.8 31.4 30.9  RDW 15.5 15.5 16.2*  PLT 229 245 253    Cardiac Enzymes Recent Labs  Lab 05/17/17 2052 05/18/17 0254 05/18/17 0903  TROPONINI 0.97* 1.05* 0.86*    Recent Labs  Lab 05/17/17 1642  TROPIPOC  1.33*     BNP Recent Labs  Lab 05/17/17 1550  BNP 754.3*     DDimer No results for input(s): DDIMER in the last 168 hours.   Radiology    No results found.  Cardiac Studies   NA  Patient Profile     72 y.o. male with PCI/DES to RCA (not a CABG candidate), Chronic diastolic CHF (EF 57% on last echo 09/2016), HTN, HLD, CKD with progression to ESRD this admission, anemia of chronic disease, and anxiety, who presented with complaints of CP and SOB.    Assessment & Plan    ACUTE ON CHRONIC SYSTOLIC HF:   Volume managed via dialysis.  Continue Coreg and Bidil.   No change in therapy.  ELEVATED TROPONIN:   No further ischemia work up .  Plan medical management.  Continue ASA and Plavix     ESRD:  On dialysis.    HTN:  BP controlled and he seems to be tolerating the current meds.    Please call us with further questions.    For questions or updates, please contact [place] Please consult [email or web address] for contact info under Cardiology/STEMI.    Signed, Minus Breeding, MD  05/23/2017, 8:28 AM  '

## 2017-05-24 DIAGNOSIS — N186 End stage renal disease: Secondary | ICD-10-CM | POA: Diagnosis present

## 2017-05-24 DIAGNOSIS — Z992 Dependence on renal dialysis: Secondary | ICD-10-CM

## 2017-05-24 LAB — RENAL FUNCTION PANEL
ALBUMIN: 2.6 g/dL — AB (ref 3.5–5.0)
Anion gap: 11 (ref 5–15)
BUN: 27 mg/dL — AB (ref 6–20)
CALCIUM: 8.3 mg/dL — AB (ref 8.9–10.3)
CO2: 29 mmol/L (ref 22–32)
CREATININE: 2.4 mg/dL — AB (ref 0.61–1.24)
Chloride: 95 mmol/L — ABNORMAL LOW (ref 101–111)
GFR calc non Af Amer: 25 mL/min — ABNORMAL LOW (ref >60)
GFR, EST AFRICAN AMERICAN: 29 mL/min — AB (ref >60)
Glucose, Bld: 151 mg/dL — ABNORMAL HIGH (ref 65–99)
PHOSPHORUS: 4.8 mg/dL — AB (ref 2.5–4.6)
Potassium: 3.5 mmol/L (ref 3.5–5.1)
SODIUM: 135 mmol/L (ref 135–145)

## 2017-05-24 LAB — GLUCOSE, CAPILLARY
GLUCOSE-CAPILLARY: 106 mg/dL — AB (ref 65–99)
GLUCOSE-CAPILLARY: 100 mg/dL — AB (ref 65–99)
Glucose-Capillary: 164 mg/dL — ABNORMAL HIGH (ref 65–99)

## 2017-05-24 LAB — CBC
HCT: 26.9 % — ABNORMAL LOW (ref 39.0–52.0)
Hemoglobin: 8.2 g/dL — ABNORMAL LOW (ref 13.0–17.0)
MCH: 29.6 pg (ref 26.0–34.0)
MCHC: 30.5 g/dL (ref 30.0–36.0)
MCV: 97.1 fL (ref 78.0–100.0)
PLATELETS: 245 10*3/uL (ref 150–400)
RBC: 2.77 MIL/uL — ABNORMAL LOW (ref 4.22–5.81)
RDW: 16.2 % — AB (ref 11.5–15.5)
WBC: 8.8 10*3/uL (ref 4.0–10.5)

## 2017-05-24 MED ORDER — TUBERCULIN PPD 5 UNIT/0.1ML ID SOLN
5.0000 [IU] | Freq: Once | INTRADERMAL | Status: DC
  Filled 2017-05-24: qty 0.1

## 2017-05-24 MED ORDER — CARVEDILOL 3.125 MG PO TABS: 3.1250 mg | ORAL_TABLET | Freq: Two times a day (BID) | ORAL | 3 refills | Status: DC

## 2017-05-24 MED ORDER — CALCITRIOL 0.5 MCG PO CAPS: 0.5000 ug | ORAL_CAPSULE | Freq: Every day | ORAL | 1 refills | Status: DC

## 2017-05-24 MED ORDER — ISOSORB DINITRATE-HYDRALAZINE 20-37.5 MG PO TABS: 1.0000 | ORAL_TABLET | Freq: Two times a day (BID) | ORAL | 1 refills | Status: AC

## 2017-05-24 MED ORDER — INSULIN DETEMIR 100 UNIT/ML ~~LOC~~ SOLN: 18.0000 [IU] | Freq: Two times a day (BID) | SUBCUTANEOUS | 1 refills | Status: DC

## 2017-05-24 MED ORDER — CALCIUM ACETATE (PHOS BINDER) 667 MG PO CAPS
1334.0000 mg | ORAL_CAPSULE | Freq: Three times a day (TID) | ORAL | 1 refills | Status: DC
Start: 2017-05-24 — End: 2017-07-10

## 2017-05-24 MED ORDER — RENA-VITE PO TABS: 1.0000 | ORAL_TABLET | Freq: Every day | ORAL | 1 refills | Status: DC

## 2017-05-24 MED ORDER — ACETAMINOPHEN 325 MG PO TABS: 650.0000 mg | ORAL_TABLET | ORAL | 1 refills | Status: AC | PRN

## 2017-05-24 MED ORDER — HYDROCORTISONE ACETATE 25 MG RE SUPP: 25.0000 mg | Freq: Two times a day (BID) | RECTAL | 0 refills | Status: DC | PRN

## 2017-05-24 MED ORDER — GUAIFENESIN ER 600 MG PO TB12: 600.0000 mg | ORAL_TABLET | Freq: Two times a day (BID) | ORAL | 0 refills | Status: DC

## 2017-05-24 MED ORDER — GABAPENTIN 300 MG PO CAPS: 300.0000 mg | ORAL_CAPSULE | Freq: Every day | ORAL | 1 refills | Status: DC

## 2017-05-24 MED ORDER — INSULIN LISPRO 100 UNIT/ML ~~LOC~~ SOLN: SUBCUTANEOUS | 1 refills | Status: DC

## 2017-05-24 NOTE — Procedures (Signed)
I was present at this session.  I have reviewed the session itself and made appropriate changes.  HD via PC.  bp 120s.  Some vol xs.   Mitchell Garcia 4/30/20198:40 AM

## 2017-05-24 NOTE — Discharge Summary (Signed)
Mitchell Garcia, is a 71 y.o. male  DOB Jul 02, 1945  MRN [id number].  Admission date:  05/17/2017  Admitting Physician  Norval Morton, MD  Discharge Date:  05/24/2017   Primary MD  [name], MD  Recommendations for primary care physician for things to follow:   1)Go to the [facility] in [place] for hemodialysis starting on Thursday, 05/26/2017 (Hemodialysis days will be Tuesdays/Thursdays/Saturdays) 2) very low-salt diet advised 3) you need your PPD skin test to be interpreted/read in 72 hours on around 05/27/2017 4) Please note that there is been multiple medication changes to your medication regimen 5)Hold Carvedilol (Coreg) and Bidil (isosorbide/Hydralazine) before each hemodialysis session, may take after dialysis if your blood pressure is okay   Admission Diagnosis  Precordial chest pain [R07.2] Lower extremity edema [R60.0] Right leg pain [M79.604] AKI (acute kidney injury) ([place]) [N17.9]   Discharge Diagnosis  Precordial chest pain [R07.2] Lower extremity edema [R60.0] Right leg pain [M79.604] AKI (acute kidney injury) ([place]) [N17.9]    Principal Problem:   ESRD (end stage renal disease) ([place]) Active Problems:   Acute renal failure superimposed on chronic kidney disease ([place])   NSTEMI (non-ST elevated myocardial infarction) ([place])   CAD S/P percutaneous coronary angioplasty   Uncontrolled diabetes mellitus ([place])   Acute on chronic combined systolic and diastolic CHF (congestive heart failure) ([place])   Acute blood loss anemia      Past Medical History:  Diagnosis Date  . Acute kidney injury superimposed on chronic kidney disease ([place]) 10/15/2016  . Altered mental state 01/25/2013  . Anemia of chronic disease 01/25/2013  . CAD S/P percutaneous coronary angioplasty    a.not to be a candidate for CABG. On 9/19, went back to cath lab for PCI s/p orbital atherectomy with DES to ostial RCA, and  PTCA/DES to distal RCA. Residual 50% prox LAD, 70% D1, CTO mLCx noted by initial diagnostic cath 10/08/16 to be treated medically.  . Cancer ([place])   . Chronic combined systolic and diastolic CHF (congestive heart failure) ([place])   . CKD (chronic kidney disease), stage IV ([place])   . Diabetes mellitus with renal complications (H[facility])    uncontrolled  . Essential hypertension 10/15/2016  . Humerus head fracture 01/27/2013  . Hyperlipidemia   . Hyponatremia 01/25/2013  . Insulin dependent diabetes mellitus ([place])   . Ischemic cardiomyopathy 10/15/2016  . MI (myocardial infarction) ([place])    years ago  . Stroke ([place])   . Transaminitis 01/25/2013  . Uncontrolled diabetes mellitus ([place]) 10/15/2016    Past Surgical History:  Procedure Laterality Date  . CORONARY STENT INTERVENTION N/A 10/13/2016   Procedure: CORONARY STENT INTERVENTION;  Surgeon: Burnell Blanks, MD;  Location: [place] [facility];  Service: Cardiovascular;  Laterality: N/A;  . IR FLUORO GUIDE CV LINE RIGHT  05/19/2017  . IR US GUIDE VASC ACCESS RIGHT  05/19/2017  . LEFT HEART CATH AND CORONARY ANGIOGRAPHY N/A 10/08/2016   Procedure: LEFT HEART CATH AND CORONARY ANGIOGRAPHY;  Surgeon: Nelva Bush, MD;  Location: [facility]  INVASIVE CV LAB;  Service: Cardiovascular;  Laterality: N/A;  . SKIN CANCER EXCISION    . TEMPORARY PACEMAKER N/A 10/13/2016   Procedure: Temporary Pacemaker;  Surgeon: Burnell Blanks, MD;  Location: [place] [facility];  Service: Cardiovascular;  Laterality: N/A;       HPI  from the history and physical done on the day of admission:    HPI: Mitchell Garcia is a 72 y.o. male with medical history significant of CAD s/p stents, combined CHF with EF noted to be around 35%, CKD stage IV, DM type II, CVA with right-sided weakness, and anemia; who presents after being advised to come in by the [place] for abnormal lab work.  Patient cites history but is somewhat of a poor historian.  He normally receives all of his care  at the [facility].  Patient complains of having progressively worsening lower extremity swelling over the last week after medications were decreased due to kidney function.  Patient had been still taking his Bumex as advise.  D he complained of having some mild left-sided stabbing chest pain yesterday that resolved on its own.  Patient notes that he is previously been evaluated by his cardiologist and told that the backside of his heart is stopped up, and his health is not good enough to have open heart surgery.  Associated symptoms include complaints of right leg discomfort and shortness of breath with exertion. He had gone to his nephrologist today and had received an infusion of iron.  Blood work was also obtained and his kidney function was noted to be significantly decreased.  His wife had been notified for him to come to the hospital for further evaluation.  Patient reports that he has had a fistula placed 3 weeks ago in the right upper extremity in preparation for hemodialysis.  He had also been recently placed on antibiotics for lower extremity redness, but denies any change in symptoms.  Denies any significant fever, chills, nausea, vomiting, or diarrhea.   ED Course: Admission into the emergency department patient was noted to be afebrile, pulse 73-80, respirations 1728, blood pressure 106/58-144/78, and O2 saturations 94-100% on RA.  Labs revealed WBC 9.5, hemoglobin 7.9, sodium 138, chloride 104, CO2 15, BUN 127, creatinine 4.48, glucose 338, anion gap 19, troponin I 1.33, and BNP 754.3.  Chest x-ray showed cardiomegaly without edema or consolidation.  Doppler ultrasound of lower extremity showed no signs of DVT although poor quality.  Cardiology was consulted and was started on a heparin drip.    Hospital Course:     Brief Narrative: 72 y.o.malew/ a hx ofCADs/p stents, CHF with EF 35%, CKD stage IV, DM2, CVAwithright-sided weakness,andanemia who was advised to come in by the [place] for  abnormal lab work. He noted progressively worsening lower extremity swelling over the last week as well as mild left-sided stabbing chest pain. He had an AV fistula placed 3 weeks prior in the right upper extremity in preparation for hemodialysis. He had also been recently placed on antibiotics for lower extremity redness, but denies any change in symptoms.Denies any significant fever, chills, nausea, vomiting, or diarrhea. ED labs noted BUN 127, creatinine 4.48.     Plan:- 1)Chronic kidney disease > ESRD-Care as per Nephrology - had  hemodialysis session on 05/24/2017, outpt tx has beenarranged thru the [place] facility in [place] on T/T/S  2)Acute on chronic combined systolic and diastolic CHF-last known  EF 35% -  Much improved  With statusafter hemodialysis sessions, HD - EDW to  be established, continue isosorbide/hydralazine(BiDil), continue Coreg  3)CAD S/P percutaneous coronary angioplasty- No evidence of acute coronary syndrome at this time - cath 2018 noted severe diffuse CAD - cont aspirin, Plavix, Lipitor and Coreg  4)Uncontrolled diabetes mellitus- stable, continue Levemir insulin 18 units twice a day,   5)Chronic anemia due to CKD- - iron and Aranesp perNephrology, hemoglobin is  Above 7  6)BRBPR- Clinically most consistent with hemorrhoidal bleeding or small fissure - no evidence of hemodynamically significant blood loss at this time - avoid constipation    DVT prophylaxis:SCDs Code Status:FULL CODE Family Communication:  wife at bedside  Consultants:  Nephrology  Cardiology   Significant Events: 4/23 admit  4/25 RIJ Palindrome tunneled HD cath in IR >1st HD tx    Discharge Condition:  stable  Follow UP  Follow-up Information    [facility]. Go on 05/25/2017.   Why:  Go to HD unit- on first floor at [facility]- across from pharmacy (phone # if needed- [phone]- ext. [phone])- may also contact Arlan Organ- CM for HD at [facility]-  if needed- [phone] ext. [phone] for any questions Contact information: plan for fist HD at [facility] on [address]. may 2 at 8:00 am- will need to do labs and consents on Weds. May 1          Diet and Activity recommendation:  As advised  Discharge Instructions     Discharge Instructions    (HEART FAILURE PATIENTS) Call MD:  Anytime you have any of the following symptoms: 1) 3 pound weight gain in 24 hours or 5 pounds in 1 week 2) shortness of breath, with or without a dry hacking cough 3) swelling in the hands, feet or stomach 4) if you have to sleep on extra pillows at night in order to breathe.   Complete by:  As directed    Call MD for:  difficulty breathing, headache or visual disturbances   Complete by:  As directed    Call MD for:  persistant dizziness or light-headedness   Complete by:  As directed    Call MD for:  persistant nausea and vomiting   Complete by:  As directed    Call MD for:  severe uncontrolled pain   Complete by:  As directed    Call MD for:  temperature >100.4   Complete by:  As directed    Diet - low sodium heart healthy   Complete by:  As directed    Discharge instructions   Complete by:  As directed    1)Go to the [facility] in [place] for hemodialysis starting on Thursday, 05/26/2017 (Hemodialysis days will be Tuesdays/Thursdays/Saturdays) 2) very low-salt diet advised 3) you need your PPD skin test to be interpreted/read in 72 hours on around 05/27/2017 4) Please note that there is been multiple medication changes to your medication regimen 5)Hold Carvedilol (Coreg) and Bidil (isosorbide/Hydralazine) before each hemodialysis session, may take after dialysis if your blood pressure is okay   Increase activity slowly   Complete by:  As directed         Discharge Medications     Allergies as of 05/24/2017      Reactions   Oxycodone Other (See Comments)   DIFFICULT TO WAKE UP   Tramadol Nausea And Vomiting   Lisinopril    Hyperkalemia   Losartan     Hyperkalemia      Medication List    STOP taking these medications   bumetanide 2 MG tablet Commonly known as:  BUMEX   ferumoxytol 510 mg in sodium chloride 0.9 % 100 mL   furosemide 80 MG tablet Commonly known as:  LASIX   insulin aspart protamine - aspart (70-30) 100 UNIT/ML FlexPen Commonly known as:  NOVOLOG 70/30 MIX   metolazone 5 MG tablet Commonly known as:  ZAROXOLYN     TAKE these medications   acetaminophen 325 MG tablet Commonly known as:  TYLENOL Take 2 tablets (650 mg total) by mouth every 4 (four) hours as needed for mild pain, fever or headache.   ALPRAZolam 0.5 MG tablet Commonly known as:  XANAX Take 1 tablet (0.5 mg total) by mouth at bedtime as needed for anxiety.   aspirin EC 81 MG tablet Take 81 mg by mouth daily.   atorvastatin 80 MG tablet Commonly known as:  LIPITOR Take 80 mg by mouth at bedtime.   calcitRIOL 0.5 MCG capsule Commonly known as:  ROCALTROL Take 1 capsule (0.5 mcg total) by mouth daily.   calcium acetate 667 MG capsule Commonly known as:  PHOSLO Take 2 capsules (1,334 mg total) by mouth 3 (three) times daily with meals.   carvedilol 3.125 MG tablet Commonly known as:  COREG Take 1 tablet (3.125 mg total) by mouth 2 (two) times daily with a meal.   clopidogrel 75 MG tablet Commonly known as:  PLAVIX Take 1 tablet (75 mg total) by mouth daily.   DSS 100 MG Caps Take 100 mg by mouth 2 (two) times daily.   ferrous sulfate 325 (65 FE) MG tablet Take 325 mg by mouth 2 (two) times daily.   gabapentin 300 MG capsule Commonly known as:  NEURONTIN Take 1 capsule (300 mg total) by mouth at bedtime. What changed:  when to take this   guaiFENesin 600 MG 12 hr tablet Commonly known as:  MUCINEX Take 1 tablet (600 mg total) by mouth 2 (two) times daily.   hydrocortisone 25 MG suppository Commonly known as:  ANUSOL-HC Place 1 suppository (25 mg total) rectally 2 (two) times daily as needed for hemorrhoids or anal  itching.   insulin detemir 100 UNIT/ML injection Commonly known as:  LEVEMIR Inject 0.18 mLs (18 Units total) into the skin 2 (two) times daily.   insulin lispro 100 UNIT/ML injection Commonly known as:  HUMALOG Inject subcutaneously as directed according to sliding scale instructions   isosorbide-hydrALAZINE 20-37.5 MG tablet Commonly known as:  BIDIL Take 1 tablet by mouth 2 (two) times daily.   multivitamin Tabs tablet Take 1 tablet by mouth at bedtime.   nitroGLYCERIN 0.4 MG SL tablet Commonly known as:  NITROSTAT Place 1 tablet (0.4 mg total) under the tongue every 5 (five) minutes x 3 doses as needed for chest pain.   primidone 50 MG tablet Commonly known as:  MYSOLINE Take 100 mg by mouth daily.   traMADol 50 MG tablet Commonly known as:  ULTRAM Take 2 tablets (100 mg total) by mouth 2 (two) times daily.   vitamin B-12 500 MCG tablet Commonly known as:  CYANOCOBALAMIN Take 1,000 mcg by mouth daily.       Major procedures and Radiology Reports - PLEASE review detailed and final reports for all details, in brief -   Dg Chest 2 View  Result Date: 05/17/2017 CLINICAL DATA:  Chest pain.  Renal failure. EXAM: CHEST - 2 VIEW COMPARISON:  October 04, 2016 FINDINGS: There is no edema or consolidation. Heart is mildly enlarged with pulmonary vascularity within normal limits. No adenopathy. There is aortic atherosclerosis. There  is an old healed fracture of the proximal right humerus, nonunited. IMPRESSION: Cardiomegaly. No edema or consolidation. There is aortic atherosclerosis. There is a nonunited old fracture of the right proximal humerus. Aortic Atherosclerosis (ICD10-I70.0). Electronically Signed   By: Lowella Grip III M.D.   On: 05/17/2017 14:42   Ir Fluoro Guide Cv Line Right  Result Date: 05/19/2017 CLINICAL DATA:  Renal failure. Maturing fistula. Needs access for hemodialysis. EXAM: TUNNELED HEMODIALYSIS CATHETER PLACEMENT WITH ULTRASOUND AND FLUOROSCOPIC  GUIDANCE TECHNIQUE: The procedure, risks, benefits, and alternatives were explained to the patient. Questions regarding the procedure were encouraged and answered. The patient understands and consents to the procedure. As antibiotic prophylaxis, cefazolin 2 g was ordered pre-procedure and administered intravenously within one hour of incision.Patency of the right IJ vein was confirmed with ultrasound with image documentation. An appropriate skin site was determined. Region was prepped using maximum barrier technique including cap and mask, sterile gown, sterile gloves, large sterile sheet, and Chlorhexidine as cutaneous antisepsis. The region was infiltrated locally with 1% lidocaine. Intravenous Fentanyl and Versed were administered as conscious sedation during continuous monitoring of the patient's level of consciousness and physiological / cardiorespiratory status by the radiology RN, with a total moderate sedation time of 13 minutes. Under real-time ultrasound guidance, the right IJ vein was accessed with a 21 gauge micropuncture needle; the needle tip within the vein was confirmed with ultrasound image documentation. Needle exchanged over the 018 guidewire for transitional dilator, which allowed advancement of a Benson wire into the IVC. Over this, an MPA catheter was advanced. A Palindrome 19 hemodialysis catheter was tunneled from the right anterior chest wall approach to the right IJ dermatotomy site. The MPA catheter was exchanged over an Amplatz wire for serial vascular dilators which allow placement of a peel-away sheath, through which the catheter was advanced under intermittent fluoroscopy, positioned with its tips in the proximal and midright atrium. Spot chest radiograph confirms good catheter position. No pneumothorax. Catheter was flushed and primed per protocol. Catheter secured externally with O Prolene sutures. The right IJ dermatotomy site was closed with Dermabond. COMPLICATIONS: COMPLICATIONS  None immediate FLUOROSCOPY TIME:  30 seconds; 8 mGy COMPARISON:  None IMPRESSION: 1. Technically successful placement of tunneled right IJ hemodialysis catheter with ultrasound and fluoroscopic guidance. Ready for routine use. Electronically Signed   By: Lucrezia Europe M.D.   On: 05/19/2017 10:12   Ir US Guide Vasc Access Right  Result Date: 05/19/2017 CLINICAL DATA:  Renal failure. Maturing fistula. Needs access for hemodialysis. EXAM: TUNNELED HEMODIALYSIS CATHETER PLACEMENT WITH ULTRASOUND AND FLUOROSCOPIC GUIDANCE TECHNIQUE: The procedure, risks, benefits, and alternatives were explained to the patient. Questions regarding the procedure were encouraged and answered. The patient understands and consents to the procedure. As antibiotic prophylaxis, cefazolin 2 g was ordered pre-procedure and administered intravenously within one hour of incision.Patency of the right IJ vein was confirmed with ultrasound with image documentation. An appropriate skin site was determined. Region was prepped using maximum barrier technique including cap and mask, sterile gown, sterile gloves, large sterile sheet, and Chlorhexidine as cutaneous antisepsis. The region was infiltrated locally with 1% lidocaine. Intravenous Fentanyl and Versed were administered as conscious sedation during continuous monitoring of the patient's level of consciousness and physiological / cardiorespiratory status by the radiology RN, with a total moderate sedation time of 13 minutes. Under real-time ultrasound guidance, the right IJ vein was accessed with a 21 gauge micropuncture needle; the needle tip within the vein was confirmed with ultrasound image documentation. Needle  exchanged over the 018 guidewire for transitional dilator, which allowed advancement of a Benson wire into the IVC. Over this, an MPA catheter was advanced. A Palindrome 19 hemodialysis catheter was tunneled from the right anterior chest wall approach to the right IJ dermatotomy site.  The MPA catheter was exchanged over an Amplatz wire for serial vascular dilators which allow placement of a peel-away sheath, through which the catheter was advanced under intermittent fluoroscopy, positioned with its tips in the proximal and midright atrium. Spot chest radiograph confirms good catheter position. No pneumothorax. Catheter was flushed and primed per protocol. Catheter secured externally with O Prolene sutures. The right IJ dermatotomy site was closed with Dermabond. COMPLICATIONS: COMPLICATIONS None immediate FLUOROSCOPY TIME:  30 seconds; 8 mGy COMPARISON:  None IMPRESSION: 1. Technically successful placement of tunneled right IJ hemodialysis catheter with ultrasound and fluoroscopic guidance. Ready for routine use. Electronically Signed   By: Lucrezia Europe M.D.   On: 05/19/2017 10:12    Micro Results   No results found for this or any previous visit (from the past 240 hour(s)).     Today   Subjective    Mitchell Garcia today has no new complaints,           Patient has been seen and examined prior to discharge   Objective   Blood pressure 137/69, pulse 76, temperature 98.1 F (36.7 C), temperature source Oral, resp. rate (!) 26, height 6' (1.829 m), weight 96.8 kg (213 lb 6.5 oz), SpO2 96 %.   Intake/Output Summary (Last 24 hours) at 05/24/2017 1742 Last data filed at 05/24/2017 1241 Gross per 24 hour  Intake -  Output 5505 ml  Net -5505 ml    Exam Gen:- Awake Alert, in no acute distress HEENT:- [place].AT, No sclera icterus Neck-Supple Neck,No JVD,.  Right IJ hemodialysis catheter Lungs-  CTAB , good air movement CV- S1, S2 normal,  Abd-  +ve B.Sounds, Abd Soft, No tenderness,    Extremity/Skin:- No  edema,   right wrist AV fistula with bruit and thrill Psych-affect is appropriate, oriented x3 Neuro-no new focal deficits, no tremors     Data Review   CBC w Diff:  Lab Results  Component Value Date   WBC 8.8 05/24/2017   HGB 8.2 (L) 05/24/2017   HGB 8.7 (L)  11/01/2016   HCT 26.9 (L) 05/24/2017   HCT 26.6 (L) 11/01/2016   PLT 245 05/24/2017   PLT 328 11/01/2016   LYMPHOPCT 9 05/20/2017   MONOPCT 13 05/20/2017   EOSPCT 3 05/20/2017   BASOPCT 0 05/20/2017    CMP:  Lab Results  Component Value Date   NA 135 05/24/2017   NA 144 11/01/2016   K 3.5 05/24/2017   CL 95 (L) 05/24/2017   CO2 29 05/24/2017   BUN 27 (H) 05/24/2017   BUN 66 (H) 11/01/2016   CREATININE 2.40 (H) 05/24/2017   PROT 6.2 01/25/2013   PROT 6.3 01/25/2013   ALBUMIN 2.6 (L) 05/24/2017   BILITOT 1.2 01/25/2013   BILITOT 1.2 01/25/2013   ALKPHOS 170 (H) 01/25/2013   ALKPHOS 176 (H) 01/25/2013   AST 84 (H) 01/25/2013   AST 84 (H) 01/25/2013   ALT 81 (H) 01/25/2013   ALT 81 (H) 01/25/2013  .   Total Discharge time is about 33 minutes  Roxan Hockey M.D on 05/24/2017 at 5:42 PM  [facility]   Office  [phone]  Voice Recognition Viviann Spare dictation system was used to create this note, attempts have been  made to correct errors. Please contact the author with questions and/or clarifications.

## 2017-05-24 NOTE — Discharge Instructions (Signed)
1)Go to the [facility] in [place] for hemodialysis starting on Thursday, 05/26/2017 (Hemodialysis days will be Tuesdays/Thursdays/Saturdays) 2) very low-salt diet advised 3) you need your PPD skin test to be interpreted/read in 72 hours on around 05/27/2017 4) Please note that there is been multiple medication changes to your medication regimen 5)Hold Carvedilol (Coreg) and Bidil (isosorbide/Hydralazine) before each hemodialysis session, may take after dialysis if your blood pressure is okay

## 2017-05-24 NOTE — Progress Notes (Addendum)
[facility] Progress Note    Assessment/ Plan:   Patient is a 72 year old male presenting with SOB and LE edema secondary to [facility] found to have worsening CKD stage V.  Patient has progressed to ESRD and now requires dialysis s/p tunneled catheter by IR on 4/25.  Cardiology is following.  1.  CKD Stage V now ESRD: Currently receiving HD via tunneled catheter while awaiting AV fistula maturation.  Patient tolerating HD 4/25, 4/26, 4/29.  Clip is in progress at [place].  Still requiring lower volume.  Wt 217 lbs (230 lb on admission).  UOP -0.225 L last 24 hours.  2.  Anemia of CKD: Iron saturation 79%.  Continuing Aranesp on Fridays.  No signs of active bleeding.  On Plavix and aspirin.  3.  Secondary hyperparathyroidism: PTH 428.  Continuing PhosLo 3 times daily with meals.  On calcitriol 0.5 mcg with dialysis.  4.  Hypertension: Normotensive 120s/60s.  Discontinuing carvedilol and BiDil due to improved BP while removing fluid. 5 DM controlled  Subjective:   Patient is doing well today.  No new complaints.  Tolerating diuresis well.  Denies shortness of breath or chest pain.   Objective:   BP 123/67 (BP Location: Right Arm)   Pulse 81   Temp 98.4 F (36.9 C) (Oral)   Resp (!) 30   Ht 6' (1.829 m)   Wt 217 lb 9.6 oz (98.7 kg)   SpO2 98%   BMI 29.51 kg/m   Intake/Output Summary (Last 24 hours) at 05/24/2017 0736 Last data filed at 05/24/2017 4403 Gross per 24 hour  Intake 480 ml  Output 4019 ml  Net -3539 ml   Weight change: 2 lb 13.9 oz (1.3 kg)  Physical Exam: General: elderly male, well developed, NAD with non-toxic appearance HEENT: normocephalic, atraumatic, moist mucous membranes Neck: supple, no JVD Cardiovascular: regular rate and rhythm, 2/6 systolic ejection murmur, right IJ intact Lungs: decreased breath sounds, clear to auscultation bilaterally with normal work of breathing on room air Abdomen: obese, soft, non-tender, non-distended, normoactive bowel  sounds Skin: warm, dry, no rashes or lesions, cap refill < 2 seconds Extremities: warm and well perfused, normal tone, 1+ pitting edema up to mid shin bilaterally, R-AVF maturing with thrill   Imaging: No results found.  Labs: BMET Recent Labs  Lab 05/17/17 1550 05/18/17 0254 05/19/17 0230 05/20/17 0255 05/21/17 0746 05/22/17 0231 05/23/17 0307  NA 138 135 138 140 136 138 140  K 4.4 3.9 3.8 3.4* 3.7 3.4* 3.7  CL 104 102 103 103 99* 101 103  CO2 15* 16* 19* 22 25 25 24   GLUCOSE 338* 320* 233* 121* 114* 157* 166*  BUN 127* 133* 132* 91* 56* 63* 68*  CREATININE 4.48* 4.49* 4.44* 3.37* 2.67* 3.18* 3.44*  CALCIUM 8.1* 7.8* 8.1*  8.2* 8.0* 8.2* 8.4* 8.6*  PHOS  --   --  10.6* 6.9* 4.8* 4.5 5.6*   CBC Recent Labs  Lab 05/18/17 0254 05/18/17 1441  05/20/17 0255 05/21/17 0746 05/22/17 0231 05/23/17 0648  WBC 10.0 9.8   < > 8.9 7.9 8.8 10.2  NEUTROABS 7.4 7.2  --  6.7  --   --   --   HGB 6.9* 7.9*   < > 7.3* 7.7* 7.4* 7.7*  HCT 21.7* 24.9*   < > 22.8* 25.0* 23.6* 24.9*  MCV 92.7 93.3   < > 92.7 94.3 94.8 97.6  PLT 246 238   < > 237 229 245 253   < > =  values in this interval not displayed.    Medications:    . aspirin EC  81 mg Oral Daily  . atorvastatin  80 mg Oral QHS  . calcitRIOL  0.5 mcg Oral Daily  . calcium acetate  1,334 mg Oral TID WC  . carvedilol  3.125 mg Oral BID WC  . clopidogrel  75 mg Oral Daily  . [START ON 05/27/2017] darbepoetin (ARANESP) injection - DIALYSIS  200 mcg Intravenous Q Fri-HD  . docusate sodium  100 mg Oral BID  . gabapentin  300 mg Oral QHS  . guaiFENesin  600 mg Oral BID  . heparin  40 Units/kg Dialysis Once in dialysis  . insulin aspart  0-5 Units Subcutaneous QHS  . insulin aspart  0-9 Units Subcutaneous TID WC  . insulin detemir  18 Units Subcutaneous BID  . isosorbide-hydrALAZINE  1 tablet Oral BID  . multivitamin  1 tablet Oral QHS  . sodium chloride flush  3 mL Intravenous Q12H      Harriet Butte, DO, PGY-2 I have  seen and examined this patient and agree with the plan of care seen, examined, eval, discussed with resident, patient counseled. Jeneen Rinks Marquet Faircloth 05/24/2017, 12:12 PM

## 2017-05-24 NOTE — Care Management Note (Signed)
Case Management Note Mitchell Gibbons RN, BSN Unit 4E-Case Manager [phone]  Patient Details  Name: Mitchell Garcia MRN: [id number] Date of Birth: 1945-04-11  Subjective/Objective:  Pt admitted with acute on Chronic HF- plan to start HD tx for vol. Overload- s/p tunneled HD cath- first HD today 4/25                Action/Plan: PTA pt lived at home with spouse- Notified by April at [facility]- pt is established at the [facility] - PCP is Dr. Romero Liner, [place] contact for PCP is Drue Stager- [phone] ext. [phone] or pager [phone] CM to follow for transition of care needs.   Expected Discharge Date:  05/24/17               Expected Discharge Plan:  Home/Self Care  In-House Referral:  NA  Discharge planning Services  CM Consult, Other - See comment  Post Acute Care Choice:    Choice offered to:     DME Arranged:    DME Agency:     HH Arranged:    HH Agency:     Status of Service:  Completed, signed off  If discussed at [employer] of Stay Meetings, dates discussed:    Discharge Disposition: home/self care   Additional Comments:  05/24/17- 1530- Mitchell Gibbons RN, CM-  Received call from [place] at the [facility] regarding outpt HD- per [name] pt has been approved at the [facility] for an outpt HD spot- T/T/S- can start this Thur 8 am spot- pt will need to come tomorrow to do consents either in am before noon or after  2pm- spoke with pt at bedside- per pt he would prefer to go to [place] after lunch to sign consents- per RN and tech pt has ambulated with RW to bathroom and down the hall. Pt reports that he has both RW and rollator at home to use. Notified MD that pt has outpt HD confirmed- plan to d/c today per MD.  Per [name] at the [facility]- since they have a chest xray- PPD does not need to be done prior to d/c- [facility].   05/20/17- 1200- Mitchell Gibbons RN, CM- call received from [employer] at [facility] clinic regarding pt's HD needs- she would be  the one to set pt up at the [facility] for HD needs- spoke with pt at bedside and confirmed with him that he wants to use the [place] for his HD needs- preference is at the [place] location- he request that [name] contact his wife Mitchell Garcia regarding the HD arrangements at the [place]- return call made to [place] regarding patients preference for HD arrangements through the [place] and request that she contact pt's wife. [name]'s contact at the [place] is- [phone] ext. [phone] for HD needs.  Update- 1400- received call back from [place] will start process for outpt HD setup- needed info faxed to [place] at [phone] via epic- pt will need PPD placed- have notified Dr. Thereasa Solo regarding this along with Hep B status- per [name] [facility]- (however they are unable to do new starts on sat.) - CM will f/u first of next week regarding the outpt HD arrangements.   Mitchell Patricia, RN 05/24/2017, 4:14 PM

## 2017-05-24 NOTE — Care Management Important Message (Signed)
Important Message  Patient Details  Name: Mitchell Garcia MRN: [id number] Date of Birth: [birth year]   Medicare Important Message Given:  Yes    [name] 05/24/2017, 12:33 PM

## 2017-05-26 ENCOUNTER — Emergency Department (HOSPITAL_COMMUNITY)
Admission: EM | Admit: 2017-05-26 | Discharge: 2017-05-26 | Disposition: A | Discharge status: Home / Self Care | Payer: Non-veteran care | Attending: Emergency Medicine | Admitting: Emergency Medicine

## 2017-05-26 ENCOUNTER — Other Ambulatory Visit: Payer: Self-pay

## 2017-05-26 ENCOUNTER — Emergency Department (HOSPITAL_COMMUNITY): Payer: Non-veteran care

## 2017-05-26 ENCOUNTER — Encounter (HOSPITAL_COMMUNITY): Payer: Self-pay | Admitting: *Deleted

## 2017-05-26 DIAGNOSIS — Z8673 Personal history of transient ischemic attack (TIA), and cerebral infarction without residual deficits: Secondary | ICD-10-CM | POA: Insufficient documentation

## 2017-05-26 DIAGNOSIS — R05 Cough: Secondary | ICD-10-CM | POA: Diagnosis not present

## 2017-05-26 DIAGNOSIS — R059 Cough, unspecified: Secondary | ICD-10-CM

## 2017-05-26 DIAGNOSIS — E1122 Type 2 diabetes mellitus with diabetic chronic kidney disease: Secondary | ICD-10-CM | POA: Insufficient documentation

## 2017-05-26 DIAGNOSIS — N186 End stage renal disease: Secondary | ICD-10-CM | POA: Insufficient documentation

## 2017-05-26 DIAGNOSIS — I5042 Chronic combined systolic (congestive) and diastolic (congestive) heart failure: Secondary | ICD-10-CM | POA: Insufficient documentation

## 2017-05-26 DIAGNOSIS — Z992 Dependence on renal dialysis: Secondary | ICD-10-CM | POA: Insufficient documentation

## 2017-05-26 DIAGNOSIS — Z7982 Long term (current) use of aspirin: Secondary | ICD-10-CM | POA: Diagnosis not present

## 2017-05-26 DIAGNOSIS — I252 Old myocardial infarction: Secondary | ICD-10-CM | POA: Insufficient documentation

## 2017-05-26 DIAGNOSIS — R5383 Other fatigue: Secondary | ICD-10-CM | POA: Insufficient documentation

## 2017-05-26 DIAGNOSIS — R404 Transient alteration of awareness: Secondary | ICD-10-CM | POA: Diagnosis not present

## 2017-05-26 DIAGNOSIS — Z7902 Long term (current) use of antithrombotics/antiplatelets: Secondary | ICD-10-CM | POA: Insufficient documentation

## 2017-05-26 DIAGNOSIS — Z79899 Other long term (current) drug therapy: Secondary | ICD-10-CM | POA: Diagnosis not present

## 2017-05-26 DIAGNOSIS — I132 Hypertensive heart and chronic kidney disease with heart failure and with stage 5 chronic kidney disease, or end stage renal disease: Secondary | ICD-10-CM | POA: Diagnosis not present

## 2017-05-26 DIAGNOSIS — Z794 Long term (current) use of insulin: Secondary | ICD-10-CM | POA: Insufficient documentation

## 2017-05-26 DIAGNOSIS — R55 Syncope and collapse: Secondary | ICD-10-CM

## 2017-05-26 LAB — CBC WITH DIFFERENTIAL/PLATELET
BASOS ABS: 0 10*3/uL (ref 0.0–0.1)
BASOS PCT: 0 %
Eosinophils Absolute: 0 10*3/uL (ref 0.0–0.7)
Eosinophils Relative: 0 %
HEMATOCRIT: 28.4 % — AB (ref 39.0–52.0)
HEMOGLOBIN: 8.7 g/dL — AB (ref 13.0–17.0)
LYMPHS PCT: 4 %
Lymphs Abs: 0.8 10*3/uL (ref 0.7–4.0)
MCH: 29.2 pg (ref 26.0–34.0)
MCHC: 30.6 g/dL (ref 30.0–36.0)
MCV: 95.3 fL (ref 78.0–100.0)
MONOS PCT: 9 %
Monocytes Absolute: 1.8 10*3/uL — ABNORMAL HIGH (ref 0.1–1.0)
NEUTROS ABS: 17.8 10*3/uL — AB (ref 1.7–7.7)
Neutrophils Relative %: 87 %
Platelets: 229 10*3/uL (ref 150–400)
RBC: 2.98 MIL/uL — ABNORMAL LOW (ref 4.22–5.81)
RDW: 15.5 % (ref 11.5–15.5)
WBC: 20.4 10*3/uL — ABNORMAL HIGH (ref 4.0–10.5)

## 2017-05-26 LAB — BASIC METABOLIC PANEL
ANION GAP: 13 (ref 5–15)
BUN: 37 mg/dL — ABNORMAL HIGH (ref 6–20)
CO2: 25 mmol/L (ref 22–32)
Calcium: 8.3 mg/dL — ABNORMAL LOW (ref 8.9–10.3)
Chloride: 97 mmol/L — ABNORMAL LOW (ref 101–111)
Creatinine, Ser: 3.45 mg/dL — ABNORMAL HIGH (ref 0.61–1.24)
GFR calc non Af Amer: 16 mL/min — ABNORMAL LOW (ref >60)
GFR, EST AFRICAN AMERICAN: 19 mL/min — AB (ref >60)
GLUCOSE: 188 mg/dL — AB (ref 65–99)
POTASSIUM: 3.8 mmol/L (ref 3.5–5.1)
Sodium: 135 mmol/L (ref 135–145)

## 2017-05-26 LAB — CBG MONITORING, ED: Glucose-Capillary: 175 mg/dL — ABNORMAL HIGH (ref 65–99)

## 2017-05-26 MED ORDER — BENZONATATE 100 MG PO CAPS: 100.0000 mg | ORAL_CAPSULE | Freq: Three times a day (TID) | ORAL | 0 refills | Status: DC | PRN

## 2017-05-26 NOTE — ED Triage Notes (Signed)
Pt sent from [place] in [facility] . Staff reported Pt had a syncope episode while on dialysis.

## 2017-05-26 NOTE — ED Notes (Signed)
Pt discharged from ED; instructions provided and scripts given; Pt encouraged to return to ED if symptoms worsen and to f/u with PCP; Pt verbalized understanding of all instructions 

## 2017-05-26 NOTE — ED Provider Notes (Signed)
[facility] EMERGENCY DEPARTMENT Provider Note   CSN: [id number] Arrival date & time: 05/26/17  1417  History   Chief Complaint Chief Complaint  Patient presents with  . Loss of Consciousness    HPI Mitchell Garcia is a 72 y.o. male with a past medical history of ESRD with recent initiation of HD, CAD s/p PCI interventions, combined HF (last EF 35%), DM, prior CVA, HTN who presented to the ED after a syncopal episode at HD.   Reports he had just started his HD session and felt tired like he wanted to take a nap. He denies light-headedness, chest pain, SOB, nausea, diaphoresis, or other sx at the time, no shaking reported during episode. No confusion before or upon waking from the event which family states they were told lasted 5 minutes. They were also told his BP dropped low but only values they recall being told were 155/66 with EMS. He received a small amount of IVF and was transported here.   He was just admitted 4/23-4/30 for volume overload and progression of his CKD requiring HD as inpatient, was set up for TThSa HD at [facility]. This was his first outpatient session. He held his Coreg and Bidil this morning as instructed on discharge but they do report they were instructed by a [place] doctor to continue his Lasix 80 mg which he took last night and this morning. He notes feeling generally fatigued since starting dialysis in the hospital and having a nagging cough.   Past Medical History:  Diagnosis Date  . Acute kidney injury superimposed on chronic kidney disease ([place]) 10/15/2016  . Altered mental state 01/25/2013  . Anemia of chronic disease 01/25/2013  . CAD S/P percutaneous coronary angioplasty    a.not to be a candidate for CABG. On 9/19, went back to cath lab for PCI s/p orbital atherectomy with DES to ostial RCA, and PTCA/DES to distal RCA. Residual 50% prox LAD, 70% D1, CTO mLCx noted by initial diagnostic cath 10/08/16 to be treated medically.  . Cancer ([place])   . Chronic  combined systolic and diastolic CHF (congestive heart failure) ([place])   . CKD (chronic kidney disease), stage IV ([place])   . Diabetes mellitus with renal complications (HCC)    uncontrolled  . Essential hypertension 10/15/2016  . Humerus head fracture 01/27/2013  . Hyperlipidemia   . Hyponatremia 01/25/2013  . Insulin dependent diabetes mellitus ([place])   . Ischemic cardiomyopathy 10/15/2016  . MI (myocardial infarction) ([place])    years ago  . Stroke ([place])   . Transaminitis 01/25/2013  . Uncontrolled diabetes mellitus ([place]) 10/15/2016    Patient Active Problem List   Diagnosis Date Noted  . ESRD (end stage renal disease) ([place]) 05/24/2017  . Acute blood loss anemia 05/18/2017  . Acute on chronic combined systolic and diastolic CHF (congestive heart failure) ([place]) 05/17/2017  . MI (myocardial infarction) ([place])   . Ischemic cardiomyopathy 10/15/2016  . CAD S/P percutaneous coronary angioplasty 10/15/2016  . Acute kidney injury superimposed on chronic kidney disease ([place]) 10/15/2016  . Uncontrolled diabetes mellitus ([place]) 10/15/2016  . Essential hypertension 10/15/2016  . Acute combined systolic (congestive) and diastolic (congestive) heart failure ([place])   . NSTEMI (non-ST elevated myocardial infarction) ([place]) 10/04/2016  . Acute on chronic diastolic heart failure ([place]) 01/27/2013  . Humerus head fracture 01/27/2013  . Altered mental state 01/25/2013  . Acute renal failure superimposed on chronic kidney disease ([place]) 01/25/2013  . Transaminitis 01/25/2013  . Leukocytosis 01/25/2013  .  Anemia of chronic disease 01/25/2013  . Hyponatremia 01/25/2013  . Hyperglycemia 01/25/2013    Past Surgical History:  Procedure Laterality Date  . CORONARY STENT INTERVENTION N/A 10/13/2016   Procedure: CORONARY STENT INTERVENTION;  Surgeon: Burnell Blanks, MD;  Location: [facility];  Service: Cardiovascular;  Laterality: N/A;  . IR FLUORO GUIDE CV LINE RIGHT  05/19/2017  . IR US GUIDE VASC  ACCESS RIGHT  05/19/2017  . LEFT HEART CATH AND CORONARY ANGIOGRAPHY N/A 10/08/2016   Procedure: LEFT HEART CATH AND CORONARY ANGIOGRAPHY;  Surgeon: Nelva Bush, MD;  Location: [facility];  Service: Cardiovascular;  Laterality: N/A;  . SKIN CANCER EXCISION    . TEMPORARY PACEMAKER N/A 10/13/2016   Procedure: Temporary Pacemaker;  Surgeon: Burnell Blanks, MD;  Location: [facility];  Service: Cardiovascular;  Laterality: N/A;        Home Medications    Prior to Admission medications   Medication Sig Start Date End Date Taking? Authorizing Provider  acetaminophen (TYLENOL) 325 MG tablet Take 2 tablets (650 mg total) by mouth every 4 (four) hours as needed for mild pain, fever or headache. 05/24/17  Yes Emokpae, Courage, MD  ALPRAZolam (XANAX) 0.5 MG tablet Take 1 tablet (0.5 mg total) by mouth at bedtime as needed for anxiety. 02/01/13  Yes Theodis Blaze, MD  aspirin EC 81 MG tablet Take 81 mg by mouth daily.   Yes [provider]  atorvastatin (LIPITOR) 80 MG tablet Take 80 mg by mouth at bedtime.   Yes [provider]  calcitRIOL (ROCALTROL) 0.5 MCG capsule Take 1 capsule (0.5 mcg total) by mouth daily. 05/24/17  Yes Roxan Hockey, MD  calcium acetate (PHOSLO) 667 MG capsule Take 2 capsules (1,334 mg total) by mouth 3 (three) times daily with meals. 05/24/17  Yes Roxan Hockey, MD  carvedilol (COREG) 3.125 MG tablet Take 1 tablet (3.125 mg total) by mouth 2 (two) times daily with a meal. 05/24/17  Yes Emokpae, Courage, MD  clopidogrel (PLAVIX) 75 MG tablet Take 1 tablet (75 mg total) by mouth daily. 11/08/16  Yes Isaiah Serge, NP  docusate sodium 100 MG CAPS Take 100 mg by mouth 2 (two) times daily. 02/01/13  Yes Theodis Blaze, MD  ferrous sulfate 325 (65 FE) MG tablet Take 325 mg by mouth 2 (two) times daily.   Yes [provider]  gabapentin (NEURONTIN) 300 MG capsule Take 1 capsule (300 mg total) by mouth at bedtime. 05/24/17  Yes  Emokpae, Courage, MD  guaiFENesin (MUCINEX) 600 MG 12 hr tablet Take 1 tablet (600 mg total) by mouth 2 (two) times daily. 05/24/17  Yes Emokpae, Courage, MD  hydrocortisone (ANUSOL-HC) 25 MG suppository Place 1 suppository (25 mg total) rectally 2 (two) times daily as needed for hemorrhoids or anal itching. 05/24/17  Yes Emokpae, Courage, MD  insulin detemir (LEVEMIR) 100 UNIT/ML injection Inject 0.18 mLs (18 Units total) into the skin 2 (two) times daily. 05/24/17  Yes Emokpae, Courage, MD  insulin lispro (HUMALOG) 100 UNIT/ML injection Inject subcutaneously as directed according to sliding scale instructions 05/24/17 05/24/18 Yes Emokpae, Courage, MD  isosorbide-hydrALAZINE (BIDIL) 20-37.5 MG tablet Take 1 tablet by mouth 2 (two) times daily. 05/24/17  Yes Emokpae, Courage, MD  multivitamin (RENA-VIT) TABS tablet Take 1 tablet by mouth at bedtime. 05/24/17  Yes Emokpae, Courage, MD  nitroGLYCERIN (NITROSTAT) 0.4 MG SL tablet Place 1 tablet (0.4 mg total) under the tongue every 5 (five) minutes x 3  doses as needed for chest pain. 10/18/16  Yes Cheryln Manly, NP  primidone (MYSOLINE) 50 MG tablet Take 100 mg by mouth daily.   Yes [provider]  traMADol (ULTRAM) 50 MG tablet Take 2 tablets (100 mg total) by mouth 2 (two) times daily. Patient taking differently: Take 100 mg by mouth every 6 (six) hours as needed.  02/01/13  Yes Theodis Blaze, MD  vitamin B-12 (CYANOCOBALAMIN) 500 MCG tablet Take 1,000 mcg by mouth daily.   Yes [provider]  benzonatate (TESSALON PERLES) 100 MG capsule Take 1 capsule (100 mg total) by mouth 3 (three) times daily as needed for cough. 05/26/17 05/26/18  Tawny Asal, MD    Family History Family History  Problem Relation Age of Onset  . Hyperlipidemia Father     Social History Social History   Tobacco Use  . Smoking status: Never Smoker  . Smokeless tobacco: Never Used  Substance Use Topics  . Alcohol use: No  . Drug use: No      Allergies   Oxycodone; Tramadol; Lisinopril; and Losartan   Review of Systems Review of Systems  Constitutional: Negative for diaphoresis.  Cardiovascular: Negative for chest pain and palpitations.  Gastrointestinal: Negative for nausea.  Neurological: Positive for syncope. Negative for dizziness and light-headedness.     Physical Exam Updated Vital Signs BP 134/77   Pulse 80   Temp 98.2 F (36.8 C) (Oral)   Resp (!) 21   Ht 6' (1.829 m)   Wt 98.4 kg (217 lb)   SpO2 94%   BMI 29.43 kg/m   General: Elderly male resting on stretcher comfortably, no acute distress Head: Normocephalic, atraumatic  Eyes: Normal conjuctiva and extraocular movements  ENT: Moist mucus membranes, JVD difficult due to body habitus though none appreciated  CV: R tunneled IJ catheter to R chest wall, Regular rate, systolic murmur Resp: Slightly coarse breath sounds, no wheezing, normal work of breathing, no distress  Abd: Soft, obese, +BS, non-tender to palpation  Extr: Mild LE edema, R AVF thrill   Neuro: Alert and oriented x3, following commands, answering appropriately, moving all extremities Skin: Warm, dry      ED Treatments / Results  Labs (all labs ordered are listed, but only abnormal results are displayed) Labs Reviewed  BASIC METABOLIC PANEL - Abnormal; Notable for the following components:      Result Value   Chloride 97 (*)    Glucose, Bld 188 (*)    BUN 37 (*)    Creatinine, Ser 3.45 (*)    Calcium 8.3 (*)    GFR calc non Af Amer 16 (*)    GFR calc Af Amer 19 (*)    All other components within normal limits  CBC WITH DIFFERENTIAL/PLATELET - Abnormal; Notable for the following components:   WBC 20.4 (*)    RBC 2.98 (*)    Hemoglobin 8.7 (*)    HCT 28.4 (*)    Neutro Abs 17.8 (*)    Monocytes Absolute 1.8 (*)    All other components within normal limits  CBG MONITORING, ED - Abnormal; Notable for the following components:   Glucose-Capillary 175 (*)    All other  components within normal limits  CULTURE, BLOOD (ROUTINE X 2)  CULTURE, BLOOD (ROUTINE X 2)    EKG EKG Interpretation  Date/Time:  Thursday May 26 2017 17:40:07 EDT Ventricular Rate:  99 PR Interval:    QRS Duration: 175 QT Interval:  432 QTC Calculation: 523 R  Axis:   78 Text Interpretation:  Sinus rhythm Multiple premature complexes, vent & supraven Prolonged PR interval Right bundle branch block ST-t wave abnormality No significant change since last tracing Abnormal ekg Confirmed by Carmin Muskrat [phone]) on 05/26/2017 5:52:43 PM   Radiology Dg Chest 2 View  Result Date: 05/26/2017 CLINICAL DATA:  Cough. EXAM: CHEST - 2 VIEW COMPARISON:  05/17/2017 FINDINGS: Low volume film. The lungs are clear without focal pneumonia, edema, pneumothorax or pleural effusion. Interstitial markings are diffusely coarsened with chronic features. The cardio pericardial silhouette is enlarged. Right IJ central line tip overlies the SVC/RA junction. Posttraumatic deformity noted proximal right humerus. Telemetry leads overlie the chest. IMPRESSION: Low volume film without acute cardiopulmonary findings. Electronically Signed   By: Misty Stanley M.D.   On: 05/26/2017 18:36     Medications Ordered in ED Medications - No data to display   Initial Impression / Assessment and Plan / ED Course  I have reviewed the triage vital signs and the nursing notes.  Pertinent labs & imaging results that were available during my care of the patient were reviewed by me and considered in my medical decision making (see chart for details).   72 yo M with cardiac history, recent progression of renal disease to ESRD and HD, presented with likely syncopal episode shortly after HD initiation. Based on chart review, appears to have had tolerated several sessions as inpatient prior to this. No prodromal sx reported prior to episode, no confusion or post-ictal sx, no sensation of palpitations. EKG obtained which showed sinus  rhythm and chronic ST changes, right bundle branch block, no events on cardiac monitoring in ED. Labs showed Hgb 8.7 (similar or improved compared to last presentation), K 3.8. Does not appear grossly volume overloaded, wt here is 217 lbs-same as discharge. He did resume his Lasix 80 mg at instruction of a different doctor--held on prior discharge. Held coreg, bidil this am as instructed. May have had low intravascular volume leading to syncopal episode with HD, cardiogenic not ruled out but no evidence here--would require longer term monitoring as outpatient for definitive r/o. Unlikely to be neurogenic in cause based on historical features.   He did have leukocytosis to 34.1 with neutrophilic predominance on labword. CXR showed chronic changes without evidence of focal consolidation to suggest pneumonia. Afebrile. Given complaint of recent cough, this may be a viral URI to account for this leukocytosis. Sent prescription for Tessalon, encouraged over-the-counter dextromethorphan for supportive treatment.  Blood cultures drawn prior to discharge to rule out bacteremia given the presence of tunneled IJ catheter, however low suspicion given overall clinical appearance-will follow.   Final Clinical Impressions(s) / ED Diagnoses   Final diagnoses:  Cough  Syncope, unspecified syncope type    ED Discharge Orders        Ordered    benzonatate (TESSALON PERLES) 100 MG capsule  3 times daily PRN     05/26/17 1929       Tawny Asal, MD 05/26/17 2257    Carmin Muskrat, MD 05/28/17 [id number]

## 2017-05-26 NOTE — Discharge Instructions (Addendum)
Nice to meet you Mitchell Garcia. Your episode of passing out may have been related to your volume and the dialysis, along with Lasix--we recommend holding this and following the previous recommendations with the coreg and bidil.  Your EKG and heart monitoring have looked reassuring here.  Your labs suggest that you are okay to have dialysis at your next normal scheduled session.  He did have an elevation in a lab that may point to an infection.  This may be due to a virus that is also causing your cough but your chest x-ray was reassuring and you did not have a fever.  You are safe to go home and we wrote a prescription for some cough medicine, you can also use over the counter dextromethorphan which is safe for kidney disease. If anything changes--cough or breathing worsens, having fevers greater than 100.4, etc- don't hesitate to come back.

## 2017-05-31 LAB — CULTURE, BLOOD (ROUTINE X 2)
Culture: NO GROWTH
Culture: NO GROWTH
SPECIAL REQUESTS: ADEQUATE
SPECIAL REQUESTS: ADEQUATE

## 2017-07-07 ENCOUNTER — Inpatient Hospital Stay (HOSPITAL_COMMUNITY): Payer: Medicare Other

## 2017-07-07 ENCOUNTER — Other Ambulatory Visit: Payer: Self-pay

## 2017-07-07 ENCOUNTER — Emergency Department (HOSPITAL_COMMUNITY): Payer: Medicare Other

## 2017-07-07 ENCOUNTER — Encounter (HOSPITAL_COMMUNITY): Payer: Self-pay | Admitting: Emergency Medicine

## 2017-07-07 ENCOUNTER — Inpatient Hospital Stay (HOSPITAL_COMMUNITY)
Admission: EM | Admit: 2017-07-07 | Discharge: 2017-07-10 | DRG: 312 | Disposition: A | Discharge status: Home Health | Payer: Medicare Other | Attending: Family Medicine | Admitting: Family Medicine

## 2017-07-07 DIAGNOSIS — Z992 Dependence on renal dialysis: Secondary | ICD-10-CM | POA: Diagnosis not present

## 2017-07-07 DIAGNOSIS — Z79899 Other long term (current) drug therapy: Secondary | ICD-10-CM | POA: Diagnosis not present

## 2017-07-07 DIAGNOSIS — Z7902 Long term (current) use of antithrombotics/antiplatelets: Secondary | ICD-10-CM

## 2017-07-07 DIAGNOSIS — I361 Nonrheumatic tricuspid (valve) insufficiency: Secondary | ICD-10-CM | POA: Diagnosis not present

## 2017-07-07 DIAGNOSIS — K921 Melena: Secondary | ICD-10-CM | POA: Diagnosis present

## 2017-07-07 DIAGNOSIS — I132 Hypertensive heart and chronic kidney disease with heart failure and with stage 5 chronic kidney disease, or end stage renal disease: Secondary | ICD-10-CM | POA: Diagnosis present

## 2017-07-07 DIAGNOSIS — K649 Unspecified hemorrhoids: Secondary | ICD-10-CM | POA: Diagnosis not present

## 2017-07-07 DIAGNOSIS — M542 Cervicalgia: Secondary | ICD-10-CM | POA: Diagnosis not present

## 2017-07-07 DIAGNOSIS — L8990 Pressure ulcer of unspecified site, unspecified stage: Secondary | ICD-10-CM

## 2017-07-07 DIAGNOSIS — Z8673 Personal history of transient ischemic attack (TIA), and cerebral infarction without residual deficits: Secondary | ICD-10-CM | POA: Diagnosis not present

## 2017-07-07 DIAGNOSIS — Z7982 Long term (current) use of aspirin: Secondary | ICD-10-CM | POA: Diagnosis not present

## 2017-07-07 DIAGNOSIS — G8929 Other chronic pain: Secondary | ICD-10-CM | POA: Diagnosis present

## 2017-07-07 DIAGNOSIS — I252 Old myocardial infarction: Secondary | ICD-10-CM | POA: Diagnosis not present

## 2017-07-07 DIAGNOSIS — E860 Dehydration: Secondary | ICD-10-CM | POA: Diagnosis not present

## 2017-07-07 DIAGNOSIS — N186 End stage renal disease: Secondary | ICD-10-CM | POA: Diagnosis not present

## 2017-07-07 DIAGNOSIS — E1122 Type 2 diabetes mellitus with diabetic chronic kidney disease: Secondary | ICD-10-CM | POA: Diagnosis not present

## 2017-07-07 DIAGNOSIS — E11622 Type 2 diabetes mellitus with other skin ulcer: Secondary | ICD-10-CM | POA: Diagnosis present

## 2017-07-07 DIAGNOSIS — E11649 Type 2 diabetes mellitus with hypoglycemia without coma: Secondary | ICD-10-CM | POA: Diagnosis not present

## 2017-07-07 DIAGNOSIS — E785 Hyperlipidemia, unspecified: Secondary | ICD-10-CM | POA: Diagnosis not present

## 2017-07-07 DIAGNOSIS — Z955 Presence of coronary angioplasty implant and graft: Secondary | ICD-10-CM

## 2017-07-07 DIAGNOSIS — E1140 Type 2 diabetes mellitus with diabetic neuropathy, unspecified: Secondary | ICD-10-CM | POA: Diagnosis present

## 2017-07-07 DIAGNOSIS — Z794 Long term (current) use of insulin: Secondary | ICD-10-CM

## 2017-07-07 DIAGNOSIS — R42 Dizziness and giddiness: Secondary | ICD-10-CM | POA: Diagnosis not present

## 2017-07-07 DIAGNOSIS — I5042 Chronic combined systolic (congestive) and diastolic (congestive) heart failure: Secondary | ICD-10-CM | POA: Diagnosis present

## 2017-07-07 DIAGNOSIS — L97919 Non-pressure chronic ulcer of unspecified part of right lower leg with unspecified severity: Secondary | ICD-10-CM | POA: Diagnosis present

## 2017-07-07 DIAGNOSIS — R55 Syncope and collapse: Secondary | ICD-10-CM | POA: Diagnosis not present

## 2017-07-07 DIAGNOSIS — R251 Tremor, unspecified: Secondary | ICD-10-CM | POA: Diagnosis not present

## 2017-07-07 DIAGNOSIS — I255 Ischemic cardiomyopathy: Secondary | ICD-10-CM | POA: Diagnosis not present

## 2017-07-07 DIAGNOSIS — E041 Nontoxic single thyroid nodule: Secondary | ICD-10-CM | POA: Diagnosis present

## 2017-07-07 DIAGNOSIS — D631 Anemia in chronic kidney disease: Secondary | ICD-10-CM | POA: Diagnosis not present

## 2017-07-07 DIAGNOSIS — M25562 Pain in left knee: Secondary | ICD-10-CM | POA: Diagnosis not present

## 2017-07-07 DIAGNOSIS — L03115 Cellulitis of right lower limb: Secondary | ICD-10-CM | POA: Diagnosis present

## 2017-07-07 DIAGNOSIS — I2510 Atherosclerotic heart disease of native coronary artery without angina pectoris: Secondary | ICD-10-CM | POA: Diagnosis present

## 2017-07-07 DIAGNOSIS — Z85828 Personal history of other malignant neoplasm of skin: Secondary | ICD-10-CM

## 2017-07-07 DIAGNOSIS — I120 Hypertensive chronic kidney disease with stage 5 chronic kidney disease or end stage renal disease: Secondary | ICD-10-CM | POA: Diagnosis not present

## 2017-07-07 LAB — HEPATIC FUNCTION PANEL
ALBUMIN: 3.5 g/dL (ref 3.5–5.0)
ALK PHOS: 80 U/L (ref 38–126)
ALT: 25 U/L (ref 17–63)
AST: 41 U/L (ref 15–41)
BILIRUBIN TOTAL: 0.4 mg/dL (ref 0.3–1.2)
Bilirubin, Direct: <0.1 mg/dL — ABNORMAL LOW (ref 0.1–0.5)
Total Protein: 7.1 g/dL (ref 6.5–8.1)

## 2017-07-07 LAB — CBC
HCT: 24 % — ABNORMAL LOW (ref 39.0–52.0)
HEMOGLOBIN: 7.2 g/dL — AB (ref 13.0–17.0)
MCH: 29.8 pg (ref 26.0–34.0)
MCHC: 30 g/dL (ref 30.0–36.0)
MCV: 99.2 fL (ref 78.0–100.0)
PLATELETS: 241 10*3/uL (ref 150–400)
RBC: 2.42 MIL/uL — AB (ref 4.22–5.81)
RDW: 15.5 % (ref 11.5–15.5)
WBC: 9.7 10*3/uL (ref 4.0–10.5)

## 2017-07-07 LAB — BASIC METABOLIC PANEL
ANION GAP: 18 — AB (ref 5–15)
BUN: 74 mg/dL — ABNORMAL HIGH (ref 6–20)
CO2: 21 mmol/L — AB (ref 22–32)
CREATININE: 5.38 mg/dL — AB (ref 0.61–1.24)
Calcium: 9 mg/dL (ref 8.9–10.3)
Chloride: 102 mmol/L (ref 101–111)
GFR calc non Af Amer: 9 mL/min — ABNORMAL LOW (ref >60)
GFR, EST AFRICAN AMERICAN: 11 mL/min — AB (ref >60)
Glucose, Bld: 153 mg/dL — ABNORMAL HIGH (ref 65–99)
Potassium: 4.4 mmol/L (ref 3.5–5.1)
SODIUM: 141 mmol/L (ref 135–145)

## 2017-07-07 LAB — MRSA PCR SCREENING: MRSA by PCR: NEGATIVE

## 2017-07-07 LAB — I-STAT TROPONIN, ED: Troponin i, poc: 0.18 ng/mL (ref 0.00–0.08)

## 2017-07-07 LAB — MAGNESIUM: Magnesium: 2.5 mg/dL — ABNORMAL HIGH (ref 1.7–2.4)

## 2017-07-07 LAB — PREPARE RBC (CROSSMATCH)

## 2017-07-07 LAB — CBG MONITORING, ED: Glucose-Capillary: 197 mg/dL — ABNORMAL HIGH (ref 65–99)

## 2017-07-07 MED ORDER — ACETAMINOPHEN 650 MG RE SUPP: 650.0000 mg | Freq: Four times a day (QID) | RECTAL | Status: DC | PRN

## 2017-07-07 MED ORDER — LIDOCAINE HCL (PF) 1 % IJ SOLN: 5.0000 mL | INTRAMUSCULAR | Status: DC | PRN

## 2017-07-07 MED ORDER — GABAPENTIN 300 MG PO CAPS
300.0000 mg | ORAL_CAPSULE | Freq: Every day | ORAL | Status: DC
  Administered 2017-07-08 – 2017-07-09 (×3): 300 mg via ORAL
  Filled 2017-07-07 (×3): qty 1

## 2017-07-07 MED ORDER — VANCOMYCIN HCL IN DEXTROSE 1-5 GM/200ML-% IV SOLN
1000.0000 mg | INTRAVENOUS | Status: DC
  Administered 2017-07-07 – 2017-07-09 (×2): 1000 mg via INTRAVENOUS
  Filled 2017-07-07: qty 200

## 2017-07-07 MED ORDER — DARBEPOETIN ALFA 200 MCG/0.4ML IJ SOSY
PREFILLED_SYRINGE | INTRAMUSCULAR | Status: AC
  Filled 2017-07-07: qty 0.4

## 2017-07-07 MED ORDER — LIDOCAINE-PRILOCAINE 2.5-2.5 % EX CREA
1.0000 "application " | TOPICAL_CREAM | CUTANEOUS | Status: DC | PRN
  Filled 2017-07-07: qty 5

## 2017-07-07 MED ORDER — SODIUM CHLORIDE 0.9 % IV SOLN: 100.0000 mL | INTRAVENOUS | Status: DC | PRN

## 2017-07-07 MED ORDER — SODIUM CHLORIDE 0.9 % IV SOLN: Freq: Once | INTRAVENOUS | Status: DC

## 2017-07-07 MED ORDER — ISOSORB DINITRATE-HYDRALAZINE 20-37.5 MG PO TABS
1.0000 | ORAL_TABLET | Freq: Two times a day (BID) | ORAL | Status: DC
  Administered 2017-07-08 – 2017-07-10 (×6): 1 via ORAL
  Filled 2017-07-07 (×6): qty 1

## 2017-07-07 MED ORDER — CHLORHEXIDINE GLUCONATE CLOTH 2 % EX PADS
6.0000 | MEDICATED_PAD | Freq: Every day | CUTANEOUS | Status: DC
  Administered 2017-07-08 – 2017-07-10 (×3): 6 via TOPICAL

## 2017-07-07 MED ORDER — DARBEPOETIN ALFA 200 MCG/0.4ML IJ SOSY
200.0000 ug | PREFILLED_SYRINGE | INTRAMUSCULAR | Status: DC
Start: 2017-07-07 — End: 2017-07-10
  Administered 2017-07-07: 200 ug via INTRAVENOUS
  Filled 2017-07-07: qty 0.4

## 2017-07-07 MED ORDER — ASPIRIN EC 81 MG PO TBEC
81.0000 mg | DELAYED_RELEASE_TABLET | Freq: Every day | ORAL | Status: DC
  Administered 2017-07-08 – 2017-07-10 (×4): 81 mg via ORAL
  Filled 2017-07-07 (×4): qty 1

## 2017-07-07 MED ORDER — ALPRAZOLAM 0.5 MG PO TABS
0.5000 mg | ORAL_TABLET | Freq: Every evening | ORAL | Status: DC | PRN
  Administered 2017-07-08 – 2017-07-09 (×2): 0.5 mg via ORAL
  Filled 2017-07-07 (×2): qty 1

## 2017-07-07 MED ORDER — VANCOMYCIN HCL 10 G IV SOLR
2000.0000 mg | INTRAVENOUS | Status: DC
  Filled 2017-07-07: qty 2000

## 2017-07-07 MED ORDER — HEPARIN SODIUM (PORCINE) 5000 UNIT/ML IJ SOLN
5000.0000 [IU] | Freq: Three times a day (TID) | INTRAMUSCULAR | Status: DC
  Administered 2017-07-08 (×3): 5000 [IU] via SUBCUTANEOUS
  Filled 2017-07-07 (×4): qty 1

## 2017-07-07 MED ORDER — SODIUM CHLORIDE 0.9 % IV SOLN
2.0000 g | INTRAVENOUS | Status: DC
  Administered 2017-07-08 – 2017-07-09 (×2): 2 g via INTRAVENOUS
  Filled 2017-07-07 (×3): qty 2

## 2017-07-07 MED ORDER — ACETAMINOPHEN 325 MG PO TABS
650.0000 mg | ORAL_TABLET | Freq: Four times a day (QID) | ORAL | Status: DC | PRN
  Administered 2017-07-08 – 2017-07-10 (×4): 650 mg via ORAL
  Filled 2017-07-07 (×4): qty 2

## 2017-07-07 MED ORDER — CLOPIDOGREL BISULFATE 75 MG PO TABS
75.0000 mg | ORAL_TABLET | Freq: Every day | ORAL | Status: DC
  Administered 2017-07-08 (×2): 75 mg via ORAL
  Filled 2017-07-07 (×2): qty 1

## 2017-07-07 MED ORDER — HEPARIN SODIUM (PORCINE) 1000 UNIT/ML DIALYSIS
1000.0000 [IU] | INTRAMUSCULAR | Status: DC | PRN
  Administered 2017-07-08: 1000 [IU] via INTRAVENOUS_CENTRAL
  Filled 2017-07-07 (×2): qty 1

## 2017-07-07 MED ORDER — CARVEDILOL 3.125 MG PO TABS
3.1250 mg | ORAL_TABLET | Freq: Two times a day (BID) | ORAL | Status: DC
  Administered 2017-07-08 – 2017-07-10 (×5): 3.125 mg via ORAL
  Filled 2017-07-07 (×5): qty 1

## 2017-07-07 MED ORDER — PRIMIDONE 50 MG PO TABS
100.0000 mg | ORAL_TABLET | Freq: Every day | ORAL | Status: DC
  Administered 2017-07-08 – 2017-07-10 (×4): 100 mg via ORAL
  Filled 2017-07-07 (×4): qty 2

## 2017-07-07 MED ORDER — VANCOMYCIN HCL IN DEXTROSE 1-5 GM/200ML-% IV SOLN
1000.0000 mg | INTRAVENOUS | Status: DC
Start: 2017-07-09 — End: 2017-07-07

## 2017-07-07 MED ORDER — SEVELAMER CARBONATE 800 MG PO TABS
1600.0000 mg | ORAL_TABLET | Freq: Three times a day (TID) | ORAL | Status: DC
  Administered 2017-07-08 – 2017-07-10 (×7): 1600 mg via ORAL
  Filled 2017-07-07 (×7): qty 2

## 2017-07-07 MED ORDER — ATORVASTATIN CALCIUM 80 MG PO TABS
80.0000 mg | ORAL_TABLET | Freq: Every day | ORAL | Status: DC
  Administered 2017-07-08 – 2017-07-09 (×3): 80 mg via ORAL
  Filled 2017-07-07 (×3): qty 1

## 2017-07-07 MED ORDER — SODIUM CHLORIDE 0.9 % IV SOLN
1.0000 g | INTRAVENOUS | Status: DC
  Filled 2017-07-07: qty 1

## 2017-07-07 MED ORDER — INSULIN ASPART 100 UNIT/ML ~~LOC~~ SOLN
0.0000 [IU] | Freq: Three times a day (TID) | SUBCUTANEOUS | Status: DC
  Administered 2017-07-08 (×2): 2 [IU] via SUBCUTANEOUS
  Administered 2017-07-08 – 2017-07-09 (×2): 1 [IU] via SUBCUTANEOUS
  Administered 2017-07-09: 2 [IU] via SUBCUTANEOUS
  Administered 2017-07-10: 3 [IU] via SUBCUTANEOUS
  Administered 2017-07-10: 2 [IU] via SUBCUTANEOUS

## 2017-07-07 MED ORDER — SENNOSIDES-DOCUSATE SODIUM 8.6-50 MG PO TABS: 1.0000 | ORAL_TABLET | Freq: Every evening | ORAL | Status: DC | PRN

## 2017-07-07 MED ORDER — CALCITRIOL 0.5 MCG PO CAPS: 0.5000 ug | ORAL_CAPSULE | Freq: Every day | ORAL | Status: DC

## 2017-07-07 MED ORDER — ALTEPLASE 2 MG IJ SOLR: 2.0000 mg | Freq: Once | INTRAMUSCULAR | Status: DC | PRN

## 2017-07-07 MED ORDER — PENTAFLUOROPROP-TETRAFLUOROETH EX AERO: 1.0000 "application " | INHALATION_SPRAY | CUTANEOUS | Status: DC | PRN

## 2017-07-07 MED ORDER — NITROGLYCERIN 0.4 MG SL SUBL: 0.4000 mg | SUBLINGUAL_TABLET | SUBLINGUAL | Status: DC | PRN

## 2017-07-07 NOTE — ED Notes (Signed)
Patient transported to CT 

## 2017-07-07 NOTE — Progress Notes (Addendum)
Pharmacy Antibiotic Note  Mitchell Garcia is a 72 y.o. male admitted on 07/07/2017 with chronic wound infection.  Pharmacy has been consulted for Vancomycin / Cefepime dosing.  Receiving antibiotics prior to admission at HD ESRD - TThSat HD  Plan: Vancomycin 1 gram IV Q HD - next dose tonight Cefepime 2 grams iv Q HD   Follow up HD schedule, LOT  Weight: 217 lb 4.8 oz (98.6 kg)  Temp (24hrs), Avg:97.8 F (36.6 C), Min:97.5 F (36.4 C), Max:98.6 F (37 C)  Recent Labs  Lab 07/07/17 0949  WBC 9.7  CREATININE 5.38*    Estimated Creatinine Clearance: 14.9 mL/min (A) (by C-G formula based on SCr of 5.38 mg/dL (H)).    Allergies  Allergen Reactions  . Oxycodone Other (See Comments)    DIFFICULT TO WAKE UP  . Tramadol Nausea And Vomiting  . Lisinopril     Hyperkalemia   . Losartan     Hyperkalemia      Thank you for allowing pharmacy to be a part of this patient's care. Anette Guarneri, PharmD [phone]  07/07/2017 9:26 PM

## 2017-07-07 NOTE — H&P (Addendum)
[facility] Admission History and Physical Service Pager: [phone]  Patient name: Mitchell Garcia Medical record number: [id number] Date of birth: 26-Dec-1945 Age: 72 y.o. Gender: male  Primary Care Provider: [name], MD Consultants: Nephrology Code Status: Full  Chief Complaint: "I got weak and then passed out"  Assessment and Plan: Mitchell Garcia is a 72 y.o. male presenting with a witnessed syncopal episode. PMH is significant for ESRD on HD TTS, Anemia of Chronic Disease, HFrEF, CAD s/p stent x 2, DMT2, HTN, Hx of stroke in 1994.  Syncope. Patient had a true witnessed syncopal episode. Multiple etiologies considered. Seizure is unlikely as patient has no history, no electrolyte abnormalities, no loss of bladder or bowel incontinence. Cardiac etiology is considered as patient has history of HFrEF and CAD s/p stent however he has not had any cardiac symptoms in the past 48 hours. Troponin initially was 0.18 but has been elevated in the past and EKG is abnormal but appears unchanged from baseline. CT of Head showed no acute changes. Most likely due to orthostatic as patient is reporting dizziness on standing ever since he started HD in May however vasovagal cannot be ruled out. His recent start on HD and his feelings of cramping with extra fluid being taken off at his last dialysis appointment all give more support to orthostatics. - Admit to Telemetry, attending Dr. Erin Hearing - Obtain CXR, ECHO,  - Obtain TSH - Trend Troponin - Repeat EKG in am - Orthostatics - Consider starting midodrine with dialysis  Anemia. Acute on Chronic, in the setting of anemia of CKD. Concern for symptomatic anemia since Hgb of 7.2 on admission, baseline of around 9-10. His Hgb has been down trending for the past few months. Last iron panel was significant for Iron of 161, TIBC 203, Ferritin of 150, consistent with anemia of chronic disease. - Type and Screen/ prepare and  transfuse 1u PRBC - daily CBC  ESRD on HD. Chronic. Recently started dialysis several months ago. - Nephrology consulted, appreciate recs - monitor Scr  Chronic Leg Wound. Chronic. Patient has been receiving Vanc and Cefepime every other day with dialysis at the [facility] and Cefepime dosing per pharmacy; he was getting with dialysis - wound care consult  HFrEF. Chronic, stable. Last Echo was 10/04/2016 significant for LVEF 35% with diffuse hypokinesis and grade 1 diastolic dysfunction. - See syncopal problem above - CXR pending - Repeat ECHO pending  CAD s/p stent x 2. Chronic, stable. No current symptoms. Left Heart Cath on 9/14 significant for multivessel disease with proximal LAD stenosis of 70%, total occlusion of mid LCx, and 95% of RCA stenosis. On statin, Nitro, Coreg,  - Cont ASA and Plavix for at least one year post LHC. - monitor with Telemetry - Cont Atorvastatin 80mg   Rectal Bleeding. Hx of hemorrhoids that he states has improved in the last few day that had been bleeding. - FOBT - EDG and Colonoscopy outpatient vs inpatient?  HTN. Chronic. Well controlled on admission. SBP 119-131 with DBP 52-76. On Coreg at home. - Cont Coreg 3.125 BID  DM. Chronic. On home Levemir 18U BID and Novolog mix 70/30 30-40. - monitor CBGs - sSSI  Tremors. Chronic, stable. On primidone at home. - cont primidone 100mg  daily  FEN/GI: Renal/Carb modified diet Prophylaxis: Heparin  Disposition: admit to Telemetry  History of Present Illness:  Mitchell Garcia is a 72 y.o. male with PMH of ESRD on HD TTS, Anemia of Chronic  Disease, HFrEF, CAD s/p stent x 2, DMT2, HTN, Hx of stroke presenting with a syncopal episode witnessed by his wife that occurred earlier today. He states he recently started dialysis HD in May 2019. He had being feeling fine upon waking this morning with no symptoms. He denies any recent sickness or acute illness. He states he was walking on his way to  dialysis and suddenly he felt "weak in my legs and I knew I was going down". He remembers falling but then does not remember falling. His wife states she heard him call out and ran to him in time to see him fall. She witnessed "a few seconds" of full body shaking, eyes were closed, no loss of bladder or bowel continence. He quickly regained consciousness and was disoriented for a little bit but quickly returned to baseline with no memory loss. His wife asked him to smile and he could do so with no asymmetry and no slurred speech was observed. Of note, he had a hypoglycemic event but CBG on arrival was 136. He was back to baseline by the time he arrived in the ED. He states this was similar to an episode in May when he was seen in the ED after starting HD. He describes some lightheadness and dizziness upon sitting up or standing that started after initiating HD that he has had to be very careful about.   Of note, he also states that he has had some blood in his stool, only seen on wiping about 3-4 days ago. He does report a h/o hemorrhoids. He states he has never had a colonoscopy. He has had about a 15 lb weight loss over the last 1-2 months that he thought was due to pulling off fluid in HD. No night sweats.  Review Of Systems: Per HPI with the following additions:   Review of Systems  Constitutional: Negative for chills, diaphoresis, fever and malaise/fatigue.  HENT: Negative for congestion and sinus pain.   Eyes: Negative for blurred vision and double vision.  Respiratory: Negative for cough, sputum production, shortness of breath and wheezing.   Cardiovascular: Positive for leg swelling. Negative for chest pain, palpitations and orthopnea.  Gastrointestinal: Positive for blood in stool. Negative for abdominal pain, diarrhea, nausea and vomiting.  Genitourinary: Negative for dysuria and urgency.  Musculoskeletal: Positive for joint pain.  Neurological: Positive for tremors. Negative for dizziness,  tingling and focal weakness.  Psychiatric/Behavioral: Negative for memory loss.   Patient Active Problem List   Diagnosis Date Noted  . ESRD (end stage renal disease) ([place]) 05/24/2017  . Acute blood loss anemia 05/18/2017  . Acute on chronic combined systolic and diastolic CHF (congestive heart failure) ([place]) 05/17/2017  . MI (myocardial infarction) ([place])   . Ischemic cardiomyopathy 10/15/2016  . CAD S/P percutaneous coronary angioplasty 10/15/2016  . Acute kidney injury superimposed on chronic kidney disease ([place]) 10/15/2016  . Uncontrolled diabetes mellitus ([place]) 10/15/2016  . Essential hypertension 10/15/2016  . Acute combined systolic (congestive) and diastolic (congestive) heart failure ([place])   . NSTEMI (non-ST elevated myocardial infarction) ([place]) 10/04/2016  . Acute on chronic diastolic heart failure ([place]) 01/27/2013  . Humerus head fracture 01/27/2013  . Altered mental state 01/25/2013  . Acute renal failure superimposed on chronic kidney disease ([place]) 01/25/2013  . Transaminitis 01/25/2013  . Leukocytosis 01/25/2013  . Anemia of chronic disease 01/25/2013  . Hyponatremia 01/25/2013  . Hyperglycemia 01/25/2013    Past Medical History: Past Medical History:  Diagnosis Date  . Acute kidney  injury superimposed on chronic kidney disease ([place]) 10/15/2016  . Altered mental state 01/25/2013  . Anemia of chronic disease 01/25/2013  . CAD S/P percutaneous coronary angioplasty    a.not to be a candidate for CABG. On 9/19, went back to cath lab for PCI s/p orbital atherectomy with DES to ostial RCA, and PTCA/DES to distal RCA. Residual 50% prox LAD, 70% D1, CTO mLCx noted by initial diagnostic cath 10/08/16 to be treated medically.  . Cancer ([place])   . Chronic combined systolic and diastolic CHF (congestive heart failure) ([place])   . CKD (chronic kidney disease), stage IV ([place])   . Diabetes mellitus with renal complications ([facility])    uncontrolled  . Essential hypertension 10/15/2016  .  Humerus head fracture 01/27/2013  . Hyperlipidemia   . Hyponatremia 01/25/2013  . Insulin dependent diabetes mellitus ([place])   . Ischemic cardiomyopathy 10/15/2016  . MI (myocardial infarction) ([place])    years ago  . Stroke ([place])   . Transaminitis 01/25/2013  . Uncontrolled diabetes mellitus ([place]) 10/15/2016    Past Surgical History: Past Surgical History:  Procedure Laterality Date  . CORONARY STENT INTERVENTION N/A 10/13/2016   Procedure: CORONARY STENT INTERVENTION;  Surgeon: Burnell Blanks, MD;  Location: [facility];  Service: Cardiovascular;  Laterality: N/A;  . IR FLUORO GUIDE CV LINE RIGHT  05/19/2017  . IR US GUIDE VASC ACCESS RIGHT  05/19/2017  . LEFT HEART CATH AND CORONARY ANGIOGRAPHY N/A 10/08/2016   Procedure: LEFT HEART CATH AND CORONARY ANGIOGRAPHY;  Surgeon: Nelva Bush, MD;  Location: [facility];  Service: Cardiovascular;  Laterality: N/A;  . SKIN CANCER EXCISION    . TEMPORARY PACEMAKER N/A 10/13/2016   Procedure: Temporary Pacemaker;  Surgeon: Burnell Blanks, MD;  Location: [facility];  Service: Cardiovascular;  Laterality: N/A;    Social History: Social History   Tobacco Use  . Smoking status: Never Smoker  . Smokeless tobacco: Never Used  Substance Use Topics  . Alcohol use: No  . Drug use: No   Additional social history:  Please also refer to relevant sections of EMR.  Family History: Family History  Problem Relation Age of Onset  . Hyperlipidemia Father     Allergies and Medications: Allergies  Allergen Reactions  . Oxycodone Other (See Comments)    DIFFICULT TO WAKE UP  . Tramadol Nausea And Vomiting  . Lisinopril     Hyperkalemia   . Losartan     Hyperkalemia    No current facility-administered medications on file prior to encounter.    Current Outpatient Medications on File Prior to Encounter  Medication Sig Dispense Refill  . acetaminophen (TYLENOL) 325 MG tablet Take 2 tablets (650 mg total) by  mouth every 4 (four) hours as needed for mild pain, fever or headache. 10 tablet 1  . ALPRAZolam (XANAX) 0.5 MG tablet Take 1 tablet (0.5 mg total) by mouth at bedtime as needed for anxiety. 30 tablet 0  . aspirin EC 81 MG tablet Take 81 mg by mouth daily.    [name] atorvastatin (LIPITOR) 80 MG tablet Take 80 mg by mouth at bedtime.    . benzonatate (TESSALON PERLES) 100 MG capsule Take 1 capsule (100 mg total) by mouth 3 (three) times daily as needed for cough. 30 capsule 0  . calcitRIOL (ROCALTROL) 0.5 MCG capsule Take 1 capsule (0.5 mcg total) by mouth daily. 30 capsule 1  . carvedilol (COREG) 3.125 MG tablet Take 1 tablet (3.125 mg total)  by mouth 2 (two) times daily with a meal. 60 tablet 3  . cholecalciferol (VITAMIN D) 1000 units tablet Take 1,000 Units by mouth daily.    . clopidogrel (PLAVIX) 75 MG tablet Take 1 tablet (75 mg total) by mouth daily. 90 tablet 3  . docusate sodium 100 MG CAPS Take 100 mg by mouth 2 (two) times daily. 10 capsule 0  . gabapentin (NEURONTIN) 300 MG capsule Take 1 capsule (300 mg total) by mouth at bedtime. 30 capsule 1  . hydrocortisone (ANUSOL-HC) 25 MG suppository Place 1 suppository (25 mg total) rectally 2 (two) times daily as needed for hemorrhoids or anal itching. 12 suppository 0  . insulin aspart protamine- aspart (NOVOLOG MIX 70/30) (70-30) 100 UNIT/ML injection Inject 30-40 Units into the skin 2 (two) times daily with a meal.    . isosorbide-hydrALAZINE (BIDIL) 20-37.5 MG tablet Take 1 tablet by mouth 2 (two) times daily. 60 tablet 1  . multivitamin (RENA-VIT) TABS tablet Take 1 tablet by mouth at bedtime. 30 tablet 1  . nitroGLYCERIN (NITROSTAT) 0.4 MG SL tablet Place 1 tablet (0.4 mg total) under the tongue every 5 (five) minutes x 3 doses as needed for chest pain. 25 tablet 2  . primidone (MYSOLINE) 50 MG tablet Take 100 mg by mouth daily.    . traMADol (ULTRAM) 50 MG tablet Take 2 tablets (100 mg total) by mouth 2 (two) times daily. (Patient taking  differently: Take 100 mg by mouth every 6 (six) hours as needed. ) 30 tablet 1  . vitamin B-12 (CYANOCOBALAMIN) 500 MCG tablet Take 1,000 mcg by mouth daily.    . calcium acetate (PHOSLO) 667 MG capsule Take 2 capsules (1,334 mg total) by mouth 3 (three) times daily with meals. (Patient not taking: Reported on 07/07/2017) 180 capsule 1  . guaiFENesin (MUCINEX) 600 MG 12 hr tablet Take 1 tablet (600 mg total) by mouth 2 (two) times daily. (Patient not taking: Reported on 07/07/2017) 20 tablet 0  . insulin detemir (LEVEMIR) 100 UNIT/ML injection Inject 0.18 mLs (18 Units total) into the skin 2 (two) times daily. (Patient not taking: Reported on 07/07/2017) 10 mL 1  . insulin lispro (HUMALOG) 100 UNIT/ML injection Inject subcutaneously as directed according to sliding scale instructions (Patient not taking: Reported on 07/07/2017) 10 mL 1    Objective: BP 119/61   Pulse 81   Temp (!) 97.5 F (36.4 C) (Oral)   Resp (!) 22   SpO2 100%  Exam: Gen: Alert and Oriented x 3, NAD HEENT: Normocephalic, atraumatic, PERRLA, EOMI, non-erythematous pharyngeal mucosa, no exudates CV: RRR, 2/6 murmur, normal S1, S2 split, +2 pulses dorsalis pedis bilaterally Resp: CTAB, no wheezing, rales, or rhonchi, comfortable work of breathing Abd: non-distended, non-tender, soft, +bs in all four quadrants MSK: Moves all four extremities Ext: no clubbing, cyanosis, +2-3 edema in LE bilaterally with chronic venous stasis changes Neuro: CN II-XII intact, no focal or gross deficits, light sensation more intact on the right than the left (chronic and unchanged for years) Skin: warm, dry, intact, area of dark necrotized tissue on the right lower calf, see picture Psych: appropriate behavior, mood      Labs and Imaging: CBC BMET  Recent Labs  Lab 07/07/17 0949  WBC 9.7  HGB 7.2*  HCT 24.0*  PLT 241   Recent Labs  Lab 07/07/17 0949  NA 141  K 4.4  CL 102  CO2 21*  BUN 74*  CREATININE 5.38*  GLUCOSE 153*   CALCIUM 9.0  Troponin (Point of Care Test) Recent Labs    07/07/17 1003  TROPIPOC 0.18*   TSH: pending INR/PT: pending PTT: pending Magnesium: 2.5 Troponin x 3: pending EKG:  ECHO: pending CXR: pending U/A: pending  CT Head/Cervical Spine IMPRESSION: 1. No acute intracranial findings or acute cervical spine findings. 2. 2.4 cm right thyroid lobe nodule. Consider further evaluation with thyroid ultrasound. If patient is clinically hyperthyroid, consider nuclear medicine thyroid uptake and scan. 3. Cervical spondylosis and degenerative disc disease with potential impingement at C3-4, C4-5, C5-6, and C6-7. 4. Atherosclerosis. 5. Upper normal sized right upper paratracheal lymph node, nonspecific. 6. Chronic ethmoid sinusitis. There is also opacification of visualized right maxillary sinus.  CXR Pending  Nuala Alpha, DO 07/07/2017, 2:30 PM PGY-1, [facility] pager: [phone], text pages welcome  FPTS Upper-Level Resident Addendum  I have independently interviewed and examined the patient. I have discussed the above with the original author and agree with their documentation. My edits for correction/addition/clarification are in blue. Please see also any attending notes.   Bufford Lope, DO PGY-2, [facility] 07/07/2017 10:02 PM  [facility] pager: [phone] (text pages welcome through [place])

## 2017-07-07 NOTE — Progress Notes (Signed)
Pt admitted to unit from ER. Alert and oriented. Pt has full relollection of incident bringing him into the hospital. Pt states he was going out to the car to leave for dialysis in [place] and got dizzy, then fell forward. Abrasions to bilateral knees, left elbow and left side of face.  Advised patient no to attempt to get up unassisted. Bed low position with bed alarm on. Patient alert and fully oriented and states he knows he is not able to get up unassisted

## 2017-07-07 NOTE — ED Notes (Signed)
CBG 197. 

## 2017-07-07 NOTE — Consult Note (Addendum)
[facility] Consultation Note Requesting Physician:  EDP Primary Nephrologist: Thayer Dallas Reason for Consult:  Provision of HD and ESRD related services  HPI: The patient is a 72 y.o. year-old male with ESRD on HD, DM, HTN, CAD s/p PCI, chronic combined CHF, DM on insulin. HD initiated 05/19/17 during [facility] admission with progressive CKD with decompensated CHF. Douglas placed 05/18/17 by IR. Has maturing R forearm fistula (created about 6 weeks ago by Dr. Rebecca Eaton, [place] Maser). Currently dialyzing at [facility] on TTS schedule.   Seen in the ED after fall at home. His wife reports they were on their way to dialysis when he fell hitting his head on asphalt. He says he got dizzy and weak, his knees buckled and he "went out". Wife drove him to ED. Awake and alert in the ED. Sometimes gets weak with low blood sugars, but they report normal reading at home today. Scraped up his knees and left side of face.   Head CT was negative for hemorrhage, masses  or acute CVA. Labs unremarkable for ESRD patient except for Hgb 7.2. Patient does report blood in stool last week. He says he told his nephrologist at the [place] and she gave him "shots". Had BM today and denies further bleeding.   He has been tolerating dialysis well. He does report one episode of near syncope at dialysis. Apparently this was due to UF challenge. He is getting IV Vanc and cefeipime to treat a wound on R shin. They say the nephrologist has mentioned the possibility of calciphylaxis to them. Feels sore after fall. Denies dizziness, SOB, CP, abdominal pain, N/V/D.    Outpatient HD Prescription Valentino Nose [phone] Extension [id number] Charge RN) TTS 3K2.5 Ca 138Na 3hrs 45 min 400/800 37 temp EDW 97 kg (usually gets there) "Prn challenge by 0.5 as tolerated" TDC 1.6 ml hep dwell  3000 bolus heparin, no maintenance 2 mcg Hectorol 2 mcg TTS Ferrlecit 125 mg once a week No ESA  Vanco 1 gm and cefipime 2 gm (started 07/05/17) to go  through the 19th for LE "cellulitus"+  Past Medical History:  Diagnosis Date  . Acute kidney injury superimposed on chronic kidney disease ([place]) 10/15/2016  . Altered mental state 01/25/2013  . Anemia of chronic disease 01/25/2013  . CAD S/P percutaneous coronary angioplasty    a.not to be a candidate for CABG. On 9/19, went back to cath lab for PCI s/p orbital atherectomy with DES to ostial RCA, and PTCA/DES to distal RCA. Residual 50% prox LAD, 70% D1, CTO mLCx noted by initial diagnostic cath 10/08/16 to be treated medically.  . Cancer ([place])   . Chronic combined systolic and diastolic CHF (congestive heart failure) ([place])   . CKD (chronic kidney disease), stage IV ([place])   . Diabetes mellitus with renal complications (H[facility])    uncontrolled  . Essential hypertension 10/15/2016  . Humerus head fracture 01/27/2013  . Hyperlipidemia   . Hyponatremia 01/25/2013  . Insulin dependent diabetes mellitus ([place])   . Ischemic cardiomyopathy 10/15/2016  . MI (myocardial infarction) ([place])    years ago  . Stroke ([place])   . Transaminitis 01/25/2013  . Uncontrolled diabetes mellitus ([place]) 10/15/2016    Past Surgical History:  Procedure Laterality Date  . CORONARY STENT INTERVENTION N/A 10/13/2016   Procedure: CORONARY STENT INTERVENTION;  Surgeon: Burnell Blanks, MD;  Location: [facility];  Service: Cardiovascular;  Laterality: N/A;  . IR FLUORO GUIDE CV LINE RIGHT  05/19/2017  . IR US  GUIDE VASC ACCESS RIGHT  05/19/2017  . LEFT HEART CATH AND CORONARY ANGIOGRAPHY N/A 10/08/2016   Procedure: LEFT HEART CATH AND CORONARY ANGIOGRAPHY;  Surgeon: Nelva Bush, MD;  Location: [facility];  Service: Cardiovascular;  Laterality: N/A;  . SKIN CANCER EXCISION    . TEMPORARY PACEMAKER N/A 10/13/2016   Procedure: Temporary Pacemaker;  Surgeon: Burnell Blanks, MD;  Location: [facility];  Service: Cardiovascular;  Laterality: N/A;    Family History  Problem Relation Age of Onset  .  Hyperlipidemia Father    Social History:  reports that he has never smoked. He has never used smokeless tobacco. He reports that he does not drink alcohol or use drugs.   Allergies  Allergen Reactions  . Oxycodone Other (See Comments)    DIFFICULT TO WAKE UP  . Tramadol Nausea And Vomiting  . Lisinopril     Hyperkalemia   . Losartan     Hyperkalemia      Prior to Admission medications   Medication Sig Start Date End Date Taking? Authorizing Provider  acetaminophen (TYLENOL) 325 MG tablet Take 2 tablets (650 mg total) by mouth every 4 (four) hours as needed for mild pain, fever or headache. 05/24/17  Yes Emokpae, Courage, MD  ALPRAZolam (XANAX) 0.5 MG tablet Take 1 tablet (0.5 mg total) by mouth at bedtime as needed for anxiety. 02/01/13  Yes Theodis Blaze, MD  aspirin EC 81 MG tablet Take 81 mg by mouth daily.   Yes [provider]  atorvastatin (LIPITOR) 80 MG tablet Take 80 mg by mouth at bedtime.   Yes [provider]  benzonatate (TESSALON PERLES) 100 MG capsule Take 1 capsule (100 mg total) by mouth 3 (three) times daily as needed for cough. 05/26/17 05/26/18 Yes Tawny Asal, MD  calcitRIOL (ROCALTROL) 0.5 MCG capsule Take 1 capsule (0.5 mcg total) by mouth daily. 05/24/17  Yes Roxan Hockey, MD  carvedilol (COREG) 3.125 MG tablet Take 1 tablet (3.125 mg total) by mouth 2 (two) times daily with a meal. 05/24/17  Yes Emokpae, Courage, MD  cholecalciferol (VITAMIN D) 1000 units tablet Take 1,000 Units by mouth daily.   Yes [provider]  clopidogrel (PLAVIX) 75 MG tablet Take 1 tablet (75 mg total) by mouth daily. 11/08/16  Yes Isaiah Serge, NP  docusate sodium 100 MG CAPS Take 100 mg by mouth 2 (two) times daily. 02/01/13  Yes Theodis Blaze, MD  gabapentin (NEURONTIN) 300 MG capsule Take 1 capsule (300 mg total) by mouth at bedtime. 05/24/17  Yes Emokpae, Courage, MD  hydrocortisone (ANUSOL-HC) 25 MG suppository Place 1 suppository (25 mg total) rectally  2 (two) times daily as needed for hemorrhoids or anal itching. 05/24/17  Yes Emokpae, Courage, MD  insulin aspart protamine- aspart (NOVOLOG MIX 70/30) (70-30) 100 UNIT/ML injection Inject 30-40 Units into the skin 2 (two) times daily with a meal.   Yes [provider]  isosorbide-hydrALAZINE (BIDIL) 20-37.5 MG tablet Take 1 tablet by mouth 2 (two) times daily. 05/24/17  Yes Emokpae, Courage, MD  multivitamin (RENA-VIT) TABS tablet Take 1 tablet by mouth at bedtime. 05/24/17  Yes Emokpae, Courage, MD  nitroGLYCERIN (NITROSTAT) 0.4 MG SL tablet Place 1 tablet (0.4 mg total) under the tongue every 5 (five) minutes x 3 doses as needed for chest pain. 10/18/16  Yes Cheryln Manly, NP  primidone (MYSOLINE) 50 MG tablet Take 100 mg by mouth daily.   Yes [provider]  traMADol Veatrice Bourbon)  50 MG tablet Take 2 tablets (100 mg total) by mouth 2 (two) times daily. Patient taking differently: Take 100 mg by mouth every 6 (six) hours as needed.  02/01/13  Yes Theodis Blaze, MD  vitamin B-12 (CYANOCOBALAMIN) 500 MCG tablet Take 1,000 mcg by mouth daily.   Yes [provider]  calcium acetate (PHOSLO) 667 MG capsule Take 2 capsules (1,334 mg total) by mouth 3 (three) times daily with meals. Patient not taking: Reported on 07/07/2017 05/24/17   Roxan Hockey, MD  guaiFENesin (MUCINEX) 600 MG 12 hr tablet Take 1 tablet (600 mg total) by mouth 2 (two) times daily. Patient not taking: Reported on 07/07/2017 05/24/17   Roxan Hockey, MD  insulin detemir (LEVEMIR) 100 UNIT/ML injection Inject 0.18 mLs (18 Units total) into the skin 2 (two) times daily. Patient not taking: Reported on 07/07/2017 05/24/17   Roxan Hockey, MD  insulin lispro (HUMALOG) 100 UNIT/ML injection Inject subcutaneously as directed according to sliding scale instructions Patient not taking: Reported on 07/07/2017 05/24/17 05/24/18  Roxan Hockey, MD    Inpatient medications:   Review of Systems Gen:  Denies  headache, fever, chills, sweats.  No weight loss. HEENT:  No visual change, sore throat, difficulty swallowing. Resp:  No difficulty breathing, DOE.  No cough or hemoptysis. Cardiac:  No chest pain, orthopnea, PND.  Denies edema. GI:   Denies abdominal pain.   No nausea, vomiting, diarrhea.  No constipation. GU:  Denies difficulty or change in voiding.  No change in urine color.     MS:  Denies joint pain or swelling.   Derm:  Denies skin rash or itching.  No chronic skin conditions.  Neuro:   Denies focal weakness, memory problems, hx stroke or TIA.   Psych:  Denies symptoms of depression of anxiety.  No hallucination.    Physical Exam:  Blood pressure 130/72, pulse 76, temperature (!) 97.5 F (36.4 C), temperature source Oral, resp. rate 18, SpO2 98 %.  Gen: WNWD elderly male NAD pleasant, cooperative Head : abrasions to L cheek  Neck: no JVD, no bruits or LAN Chest: Normal WOB. Grossly CTAB  Heart: regular, 2/6 systolic ejection murmur, no rub or gallop Abdomen: soft, obese NT/ND  Ext: Trace LE edema, kneecaps scraped, R shin with necrotic ulcer with surrounding erythema  Multiple irregular nodular densities both calf areas, black eschar R calf Neuro: alert, Ox3, no focal deficit. Moves all extremities.  Heme/Lymph: no bruising or LAN Dialysis Access: RIJ TDC (05/19/17), maturing R forearm AVF +faint bruit    Recent Labs  Lab 07/07/17 0949  NA 141  K 4.4  CL 102  CO2 21*  GLUCOSE 153*  BUN 74*  CREATININE 5.38*  CALCIUM 9.0    Recent Labs  Lab 07/07/17 0949  AST 41  ALT 25  ALKPHOS 80  BILITOT 0.4  PROT 7.1  ALBUMIN 3.5   Recent Labs  Lab 07/07/17 0949  WBC 9.7  HGB 7.2*  HCT 24.0*  MCV 99.2  PLT 241    Recent Labs  Lab 07/07/17 1359  GLUCAP 197*     Xrays/Other Studies: Ct Head Wo Contrast  Result Date: 07/07/2017 CLINICAL DATA:  Fall today, striking head/face on asphalt. Left facial pain. EXAM: CT HEAD WITHOUT CONTRAST CT CERVICAL SPINE  WITHOUT CONTRAST TECHNIQUE: Multidetector CT imaging of the head and cervical spine was performed following the standard protocol without intravenous contrast. Multiplanar CT image reconstructions of the cervical spine were also generated. COMPARISON:  CT head  from 01/25/2013 FINDINGS: CT HEAD FINDINGS Brain: The brainstem, cerebellum, cerebral peduncles, thalami, basal ganglia, basilar cisterns, and ventricular system appear within normal limits. No intracranial hemorrhage, mass lesion, or acute CVA. Vascular: There is atherosclerotic calcification of the cavernous carotid arteries bilaterally. Skull: Unremarkable Sinuses/Orbits: Chronic ethmoid sinusitis. Opacification of the visualized right maxillary sinus. Other: No supplemental non-categorized findings. CT CERVICAL SPINE FINDINGS Alignment: No vertebral subluxation is observed. Skull base and vertebrae: Degenerative endplate sclerosis at A1-9 with loss of disc height at C3-4, C5-6, C6-7, and C7-T1. Notable spurring in the cervical and thoracic spine. Soft tissues and spinal canal: Enlarged right thyroid lobe with a suspected 2.4 cm right thyroid nodule on image 80/5. Bilateral common carotid atherosclerotic calcification. Disc levels: There is suspected osseous foraminal narrowing on the left at C3-4, C4-5, C5-6, and C6-7; and on the right at C5-6. I cannot exclude a central disc protrusion at C5-6. Upper chest: Atherosclerotic calcification of the aortic arch and branch vessels. Right upper paratracheal lymph node borderline enlarged at 1.0 cm on image 89/5. Other: No supplemental non-categorized findings. IMPRESSION: 1. No acute intracranial findings or acute cervical spine findings. 2. 2.4 cm right thyroid lobe nodule. Consider further evaluation with thyroid ultrasound. If patient is clinically hyperthyroid, consider nuclear medicine thyroid uptake and scan. 3. Cervical spondylosis and degenerative disc disease with potential impingement at C3-4, C4-5,  C5-6, and C6-7. 4. Atherosclerosis. 5. Upper normal sized right upper paratracheal lymph node, nonspecific. 6. Chronic ethmoid sinusitis. There is also opacification of visualized right maxillary sinus. Electronically Signed   By: Van Clines M.D.   On: 07/07/2017 13:13   Ct Cervical Spine Wo Contrast  Result Date: 07/07/2017 CLINICAL DATA:  Fall today, striking head/face on asphalt. Left facial pain. EXAM: CT HEAD WITHOUT CONTRAST CT CERVICAL SPINE WITHOUT CONTRAST TECHNIQUE: Multidetector CT imaging of the head and cervical spine was performed following the standard protocol without intravenous contrast. Multiplanar CT image reconstructions of the cervical spine were also generated. COMPARISON:  CT head from 01/25/2013 FINDINGS: CT HEAD FINDINGS Brain: The brainstem, cerebellum, cerebral peduncles, thalami, basal ganglia, basilar cisterns, and ventricular system appear within normal limits. No intracranial hemorrhage, mass lesion, or acute CVA. Vascular: There is atherosclerotic calcification of the cavernous carotid arteries bilaterally. Skull: Unremarkable Sinuses/Orbits: Chronic ethmoid sinusitis. Opacification of the visualized right maxillary sinus. Other: No supplemental non-categorized findings. CT CERVICAL SPINE FINDINGS Alignment: No vertebral subluxation is observed. Skull base and vertebrae: Degenerative endplate sclerosis at F7-9 with loss of disc height at C3-4, C5-6, C6-7, and C7-T1. Notable spurring in the cervical and thoracic spine. Soft tissues and spinal canal: Enlarged right thyroid lobe with a suspected 2.4 cm right thyroid nodule on image 80/5. Bilateral common carotid atherosclerotic calcification. Disc levels: There is suspected osseous foraminal narrowing on the left at C3-4, C4-5, C5-6, and C6-7; and on the right at C5-6. I cannot exclude a central disc protrusion at C5-6. Upper chest: Atherosclerotic calcification of the aortic arch and branch vessels. Right upper paratracheal  lymph node borderline enlarged at 1.0 cm on image 89/5. Other: No supplemental non-categorized findings. IMPRESSION: 1. No acute intracranial findings or acute cervical spine findings. 2. 2.4 cm right thyroid lobe nodule. Consider further evaluation with thyroid ultrasound. If patient is clinically hyperthyroid, consider nuclear medicine thyroid uptake and scan. 3. Cervical spondylosis and degenerative disc disease with potential impingement at C3-4, C4-5, C5-6, and C6-7. 4. Atherosclerosis. 5. Upper normal sized right upper paratracheal lymph node, nonspecific. 6. Chronic ethmoid sinusitis. There is  also opacification of visualized right maxillary sinus. Electronically Signed   By: Van Clines M.D.   On: 07/07/2017 13:13    Assessment/Recommendations  1. Syncope/Fall at home. No acute findings on Head CT. Further workup per primary. By symptoms orthostatic with preceding dizziness/lightheadedness. Possibly related to blood loss.  2. ESRD. TTS. Orders written for HD today on schedule. No heparin Will keep volume even with HD today to avoid dropping BP  3. HTN/Volume. BP controlled. Home meds Carvedilol 3.125, Bidil 20-37.5. UF to dry weight as tolerated. Keep SBP >100 on HD.  4. Anemia. Hgb 7.2 with history of rectal bleeding. Oupatient center reports Hgb 9.5 on 6/6. Not on ESA at center per RN at [place] stool cards. Start Aranesp here. Hold IV Fe while giving abx. Transfuse 1 unit on HD tonight.  5. MBD. Renvela 1600 qac tid at outpatient center, continue here. Follow labs.  6. R leg "cellulitis". Getting IV antibiotics at outpatient dialysis center. Necrotic eschar concerning for calciphylaxis (at time of start of HD April PTH was >400, phos 10.9 so clearly had the substrate for calciphylaxis. They have discussed with outpatient nephrologist. Given this will hold hectorol, check phos, continue non ca based binders 7. CAD s/p PCI on Plavix, aspirin 8. DM Insulin per primary  9. Enlarged R  thyroid nodule on CT - per primary    Lynnda Child PA-C [place] Pager [phone] 07/07/2017,5:06 PM   I have seen and examined this patient and agree with plan and assessment in the above note with renal recommendations/intervention highlighted. Fall 2/2 what sounds like orthostatic syncope in pt with drop in Hb to 7.2 over past week. Plan HD tonight, transfuse on HD. Workup per primary service. Getting ATB's at his HD unit for "cellulitus" - looks classic to me for calciphylaxis (when started HD April PTH>400, phos 10.9 so clearly had substrate for it). Hold outpt hectorol. Check phos. I'm not sure ATB's needed here.   Willian Donson B,MD 07/07/2017 5:39 PM

## 2017-07-07 NOTE — ED Triage Notes (Addendum)
Pt reports he was walking out to go to the [place] for dialysis when he fell onto the pavement striking his face and left knee on the ground. Bruising to face, abrasions to left knee. Pt states he is unsure why he fell but felt lightheaded prior to fall. States his cbg was 136 prior to his fall, denies any LOC from the fall. Pt a/ox4. pts wife reports pt takes plavix.

## 2017-07-07 NOTE — ED Notes (Signed)
[place] applied

## 2017-07-07 NOTE — ED Provider Notes (Signed)
[facility] EMERGENCY DEPARTMENT Provider Note   CSN: [id number] Arrival date & time: 07/07/17  7353     History   Chief Complaint Chief Complaint  Patient presents with  . Fall    HPI HAZEL WRINKLE is a 72 y.o. male who takes Plavix and has a ESRD, who fell walking to his car today on the way to dialysis this morning.  Patient states that he felt lightheaded when his knees buckled and he fell onto the left side of his body.  His wife states that he was jerking on the ground for approximately 30 seconds.  When the patient came to his wife states that he was confused but she was able to get him into the car and bring him to the hospital.  Patient has multiple abrasions to the left side of his body including his left elbow, left hand, left knee, and a contusion to the left side of his lower face.  Patient's wife states that he is now speaking slower and more slurred than he normally does.  Patient is also complaining about pain in his neck.  Patient states that his pain is mild and he is moving all extremities well in bed.  Patient ambulatory in the room from wheelchair, patient assisted with removing his clothes while in bed.  Patient has good strength and range of motion in all extremities.  Patient is also receiving weekly antibiotic therapy at dialysis for a eschar on his lower right leg.  Past Medical History:  Diagnosis Date  . Acute kidney injury superimposed on chronic kidney disease ([place]) 10/15/2016  . Altered mental state 01/25/2013  . Anemia of chronic disease 01/25/2013  . CAD S/P percutaneous coronary angioplasty    a.not to be a candidate for CABG. On 9/19, went back to cath lab for PCI s/p orbital atherectomy with DES to ostial RCA, and PTCA/DES to distal RCA. Residual 50% prox LAD, 70% D1, CTO mLCx noted by initial diagnostic cath 10/08/16 to be treated medically.  . Cancer ([place])   . Chronic combined systolic and diastolic CHF (congestive heart failure) ([place])    . CKD (chronic kidney disease), stage IV ()   . Diabetes mellitus with renal complications (HCC)    uncontrolled  . Essential hypertension 10/15/2016  . Humerus head fracture 01/27/2013  . Hyperlipidemia   . Hyponatremia 01/25/2013  . Insulin dependent diabetes mellitus ([place])   . Ischemic cardiomyopathy 10/15/2016  . MI (myocardial infarction) ([place])    years ago  . Stroke ([place])   . Transaminitis 01/25/2013  . Uncontrolled diabetes mellitus ([place]) 10/15/2016    Patient Active Problem List   Diagnosis Date Noted  . Syncope 07/07/2017  . ESRD (end stage renal disease) ([place]) 05/24/2017  . Acute blood loss anemia 05/18/2017  . Acute on chronic combined systolic and diastolic CHF (congestive heart failure) ([place]) 05/17/2017  . MI (myocardial infarction) ([place])   . Ischemic cardiomyopathy 10/15/2016  . CAD S/P percutaneous coronary angioplasty 10/15/2016  . Acute kidney injury superimposed on chronic kidney disease ([place]) 10/15/2016  . Uncontrolled diabetes mellitus ([place]) 10/15/2016  . Essential hypertension 10/15/2016  . Acute combined systolic (congestive) and diastolic (congestive) heart failure ([place])   . NSTEMI (non-ST elevated myocardial infarction) ([place]) 10/04/2016  . Acute on chronic diastolic heart failure ([place]) 01/27/2013  . Humerus head fracture 01/27/2013  . Altered mental state 01/25/2013  . Acute renal failure superimposed on chronic kidney disease ([place]) 01/25/2013  . Transaminitis 01/25/2013  . Leukocytosis  01/25/2013  . Anemia of chronic disease 01/25/2013  . Hyponatremia 01/25/2013  . Hyperglycemia 01/25/2013    Past Surgical History:  Procedure Laterality Date  . CORONARY STENT INTERVENTION N/A 10/13/2016   Procedure: CORONARY STENT INTERVENTION;  Surgeon: Burnell Blanks, MD;  Location: [facility];  Service: Cardiovascular;  Laterality: N/A;  . IR FLUORO GUIDE CV LINE RIGHT  05/19/2017  . IR US GUIDE VASC ACCESS RIGHT  05/19/2017  . LEFT HEART CATH AND  CORONARY ANGIOGRAPHY N/A 10/08/2016   Procedure: LEFT HEART CATH AND CORONARY ANGIOGRAPHY;  Surgeon: Nelva Bush, MD;  Location: [facility];  Service: Cardiovascular;  Laterality: N/A;  . SKIN CANCER EXCISION    . TEMPORARY PACEMAKER N/A 10/13/2016   Procedure: Temporary Pacemaker;  Surgeon: Burnell Blanks, MD;  Location: [facility];  Service: Cardiovascular;  Laterality: N/A;        Home Medications    Prior to Admission medications   Medication Sig Start Date End Date Taking? Authorizing Provider  acetaminophen (TYLENOL) 325 MG tablet Take 2 tablets (650 mg total) by mouth every 4 (four) hours as needed for mild pain, fever or headache. 05/24/17  Yes Emokpae, Courage, MD  ALPRAZolam (XANAX) 0.5 MG tablet Take 1 tablet (0.5 mg total) by mouth at bedtime as needed for anxiety. 02/01/13  Yes Theodis Blaze, MD  aspirin EC 81 MG tablet Take 81 mg by mouth daily.   Yes [provider]  atorvastatin (LIPITOR) 80 MG tablet Take 80 mg by mouth at bedtime.   Yes [provider]  benzonatate (TESSALON PERLES) 100 MG capsule Take 1 capsule (100 mg total) by mouth 3 (three) times daily as needed for cough. 05/26/17 05/26/18 Yes Tawny Asal, MD  calcitRIOL (ROCALTROL) 0.5 MCG capsule Take 1 capsule (0.5 mcg total) by mouth daily. 05/24/17  Yes Roxan Hockey, MD  carvedilol (COREG) 3.125 MG tablet Take 1 tablet (3.125 mg total) by mouth 2 (two) times daily with a meal. 05/24/17  Yes Emokpae, Courage, MD  cholecalciferol (VITAMIN D) 1000 units tablet Take 1,000 Units by mouth daily.   Yes [provider]  clopidogrel (PLAVIX) 75 MG tablet Take 1 tablet (75 mg total) by mouth daily. 11/08/16  Yes Isaiah Serge, NP  docusate sodium 100 MG CAPS Take 100 mg by mouth 2 (two) times daily. 02/01/13  Yes Theodis Blaze, MD  gabapentin (NEURONTIN) 300 MG capsule Take 1 capsule (300 mg total) by mouth at bedtime. 05/24/17  Yes Emokpae, Courage, MD  hydrocortisone  (ANUSOL-HC) 25 MG suppository Place 1 suppository (25 mg total) rectally 2 (two) times daily as needed for hemorrhoids or anal itching. 05/24/17  Yes Emokpae, Courage, MD  insulin aspart protamine- aspart (NOVOLOG MIX 70/30) (70-30) 100 UNIT/ML injection Inject 30-40 Units into the skin 2 (two) times daily with a meal.   Yes [provider]  isosorbide-hydrALAZINE (BIDIL) 20-37.5 MG tablet Take 1 tablet by mouth 2 (two) times daily. 05/24/17  Yes Emokpae, Courage, MD  multivitamin (RENA-VIT) TABS tablet Take 1 tablet by mouth at bedtime. 05/24/17  Yes Emokpae, Courage, MD  nitroGLYCERIN (NITROSTAT) 0.4 MG SL tablet Place 1 tablet (0.4 mg total) under the tongue every 5 (five) minutes x 3 doses as needed for chest pain. 10/18/16  Yes Cheryln Manly, NP  primidone (MYSOLINE) 50 MG tablet Take 100 mg by mouth daily.   Yes [provider]  traMADol (ULTRAM) 50 MG tablet Take 2 tablets (100 mg  total) by mouth 2 (two) times daily. Patient taking differently: Take 100 mg by mouth every 6 (six) hours as needed.  02/01/13  Yes Theodis Blaze, MD  vitamin B-12 (CYANOCOBALAMIN) 500 MCG tablet Take 1,000 mcg by mouth daily.   Yes [provider]  calcium acetate (PHOSLO) 667 MG capsule Take 2 capsules (1,334 mg total) by mouth 3 (three) times daily with meals. Patient not taking: Reported on 07/07/2017 05/24/17   Roxan Hockey, MD  guaiFENesin (MUCINEX) 600 MG 12 hr tablet Take 1 tablet (600 mg total) by mouth 2 (two) times daily. Patient not taking: Reported on 07/07/2017 05/24/17   Roxan Hockey, MD  insulin detemir (LEVEMIR) 100 UNIT/ML injection Inject 0.18 mLs (18 Units total) into the skin 2 (two) times daily. Patient not taking: Reported on 07/07/2017 05/24/17   Roxan Hockey, MD  insulin lispro (HUMALOG) 100 UNIT/ML injection Inject subcutaneously as directed according to sliding scale instructions Patient not taking: Reported on 07/07/2017 05/24/17 05/24/18  Roxan Hockey, MD     Family History Family History  Problem Relation Age of Onset  . Hyperlipidemia Father     Social History Social History   Tobacco Use  . Smoking status: Never Smoker  . Smokeless tobacco: Never Used  Substance Use Topics  . Alcohol use: No  . Drug use: No     Allergies   Oxycodone; Tramadol; Lisinopril; and Losartan   Review of Systems Review of Systems  Constitutional: Negative.  Negative for diaphoresis and fever.  HENT: Negative.   Eyes: Negative.   Respiratory: Negative.  Negative for cough and shortness of breath.   Cardiovascular: Negative.  Negative for chest pain.  Gastrointestinal: Negative.  Negative for abdominal pain, blood in stool, diarrhea, nausea and vomiting.  Genitourinary: Negative.  Negative for dysuria, penile pain and scrotal swelling.  Musculoskeletal: Positive for neck pain. Negative for back pain.       Left elbow pain, left knee pain.  Skin: Positive for wound.       Multiple skin tears to both knees, knuckles of both hands, left elbow, and a contusion to the lower left side of the face.  Neurological: Positive for dizziness, syncope, weakness and light-headedness. Negative for numbness.  Psychiatric/Behavioral: Negative.      Physical Exam Updated Vital Signs BP 119/61   Pulse 73   Temp (!) 97.5 F (36.4 C) (Oral)   Resp 16   SpO2 98%   Physical Exam  Constitutional: He is oriented to person, place, and time. He appears well-developed and well-nourished. No distress.  HENT:  Head: Normocephalic and atraumatic.    Eyes: Pupils are equal, round, and reactive to light. Conjunctivae and EOM are normal.  Neck: Normal range of motion. Neck supple.  Cardiovascular: Normal rate, regular rhythm, normal heart sounds and intact distal pulses. Exam reveals no gallop and no friction rub.  No murmur heard. Pulmonary/Chest: Effort normal and breath sounds normal. No respiratory distress.  Abdominal: Soft. Bowel sounds are normal. There is  no tenderness. There is no rebound and no guarding.  Musculoskeletal: Normal range of motion. He exhibits no deformity.       Left knee: He exhibits normal range of motion, no swelling, normal patellar mobility and no bony tenderness.       Left forearm: He exhibits tenderness. He exhibits no bony tenderness, no swelling and no deformity.       Arms:      Legs: Tenderness of the left elbow.  Neurological: He is  alert and oriented to person, place, and time. No cranial nerve deficit or sensory deficit. Coordination normal.  Skin: Skin is warm and dry. Capillary refill takes less than 2 seconds. He is not diaphoretic.     Psychiatric: He has a normal mood and affect. His behavior is normal.     ED Treatments / Results  Labs (all labs ordered are listed, but only abnormal results are displayed) Labs Reviewed  BASIC METABOLIC PANEL - Abnormal; Notable for the following components:      Result Value   CO2 21 (*)    Glucose, Bld 153 (*)    BUN 74 (*)    Creatinine, Ser 5.38 (*)    GFR calc non Af Amer 9 (*)    GFR calc Af Amer 11 (*)    Anion gap 18 (*)    All other components within normal limits  CBC - Abnormal; Notable for the following components:   RBC 2.42 (*)    Hemoglobin 7.2 (*)    HCT 24.0 (*)    All other components within normal limits  MAGNESIUM - Abnormal; Notable for the following components:   Magnesium 2.5 (*)    All other components within normal limits  HEPATIC FUNCTION PANEL - Abnormal; Notable for the following components:   Bilirubin, Direct <0.1 (*)    All other components within normal limits  CBG MONITORING, ED - Abnormal; Notable for the following components:   Glucose-Capillary 197 (*)    All other components within normal limits  I-STAT TROPONIN, ED - Abnormal; Notable for the following components:   Troponin i, poc 0.18 (*)    All other components within normal limits  URINALYSIS, ROUTINE W REFLEX MICROSCOPIC    EKG EKG  Interpretation  Date/Time:  Thursday July 07 2017 13:06:05 EDT Ventricular Rate:  56 PR Interval:    QRS Duration: 173 QT Interval:  494 QTC Calculation: 477 R Axis:   70 Text Interpretation:  Sinus rhythm Atrial premature complexes in couplets Prolonged PR interval Right bundle branch block Borderline ST depression, lateral leads Confirmed by Quintella Reichert [phone]) on 07/07/2017 1:14:13 PM   Radiology Ct Head Wo Contrast  Result Date: 07/07/2017 CLINICAL DATA:  Fall today, striking head/face on asphalt. Left facial pain. EXAM: CT HEAD WITHOUT CONTRAST CT CERVICAL SPINE WITHOUT CONTRAST TECHNIQUE: Multidetector CT imaging of the head and cervical spine was performed following the standard protocol without intravenous contrast. Multiplanar CT image reconstructions of the cervical spine were also generated. COMPARISON:  CT head from 01/25/2013 FINDINGS: CT HEAD FINDINGS Brain: The brainstem, cerebellum, cerebral peduncles, thalami, basal ganglia, basilar cisterns, and ventricular system appear within normal limits. No intracranial hemorrhage, mass lesion, or acute CVA. Vascular: There is atherosclerotic calcification of the cavernous carotid arteries bilaterally. Skull: Unremarkable Sinuses/Orbits: Chronic ethmoid sinusitis. Opacification of the visualized right maxillary sinus. Other: No supplemental non-categorized findings. CT CERVICAL SPINE FINDINGS Alignment: No vertebral subluxation is observed. Skull base and vertebrae: Degenerative endplate sclerosis at Q2-2 with loss of disc height at C3-4, C5-6, C6-7, and C7-T1. Notable spurring in the cervical and thoracic spine. Soft tissues and spinal canal: Enlarged right thyroid lobe with a suspected 2.4 cm right thyroid nodule on image 80/5. Bilateral common carotid atherosclerotic calcification. Disc levels: There is suspected osseous foraminal narrowing on the left at C3-4, C4-5, C5-6, and C6-7; and on the right at C5-6. I cannot exclude a central disc  protrusion at C5-6. Upper chest: Atherosclerotic calcification of the aortic arch and branch vessels.  Right upper paratracheal lymph node borderline enlarged at 1.0 cm on image 89/5. Other: No supplemental non-categorized findings. IMPRESSION: 1. No acute intracranial findings or acute cervical spine findings. 2. 2.4 cm right thyroid lobe nodule. Consider further evaluation with thyroid ultrasound. If patient is clinically hyperthyroid, consider nuclear medicine thyroid uptake and scan. 3. Cervical spondylosis and degenerative disc disease with potential impingement at C3-4, C4-5, C5-6, and C6-7. 4. Atherosclerosis. 5. Upper normal sized right upper paratracheal lymph node, nonspecific. 6. Chronic ethmoid sinusitis. There is also opacification of visualized right maxillary sinus. Electronically Signed   By: Van Clines M.D.   On: 07/07/2017 13:13   Ct Cervical Spine Wo Contrast  Result Date: 07/07/2017 CLINICAL DATA:  Fall today, striking head/face on asphalt. Left facial pain. EXAM: CT HEAD WITHOUT CONTRAST CT CERVICAL SPINE WITHOUT CONTRAST TECHNIQUE: Multidetector CT imaging of the head and cervical spine was performed following the standard protocol without intravenous contrast. Multiplanar CT image reconstructions of the cervical spine were also generated. COMPARISON:  CT head from 01/25/2013 FINDINGS: CT HEAD FINDINGS Brain: The brainstem, cerebellum, cerebral peduncles, thalami, basal ganglia, basilar cisterns, and ventricular system appear within normal limits. No intracranial hemorrhage, mass lesion, or acute CVA. Vascular: There is atherosclerotic calcification of the cavernous carotid arteries bilaterally. Skull: Unremarkable Sinuses/Orbits: Chronic ethmoid sinusitis. Opacification of the visualized right maxillary sinus. Other: No supplemental non-categorized findings. CT CERVICAL SPINE FINDINGS Alignment: No vertebral subluxation is observed. Skull base and vertebrae: Degenerative endplate  sclerosis at Z1-6 with loss of disc height at C3-4, C5-6, C6-7, and C7-T1. Notable spurring in the cervical and thoracic spine. Soft tissues and spinal canal: Enlarged right thyroid lobe with a suspected 2.4 cm right thyroid nodule on image 80/5. Bilateral common carotid atherosclerotic calcification. Disc levels: There is suspected osseous foraminal narrowing on the left at C3-4, C4-5, C5-6, and C6-7; and on the right at C5-6. I cannot exclude a central disc protrusion at C5-6. Upper chest: Atherosclerotic calcification of the aortic arch and branch vessels. Right upper paratracheal lymph node borderline enlarged at 1.0 cm on image 89/5. Other: No supplemental non-categorized findings. IMPRESSION: 1. No acute intracranial findings or acute cervical spine findings. 2. 2.4 cm right thyroid lobe nodule. Consider further evaluation with thyroid ultrasound. If patient is clinically hyperthyroid, consider nuclear medicine thyroid uptake and scan. 3. Cervical spondylosis and degenerative disc disease with potential impingement at C3-4, C4-5, C5-6, and C6-7. 4. Atherosclerosis. 5. Upper normal sized right upper paratracheal lymph node, nonspecific. 6. Chronic ethmoid sinusitis. There is also opacification of visualized right maxillary sinus. Electronically Signed   By: Van Clines M.D.   On: 07/07/2017 13:13    Procedures Procedures (including critical care time)  Medications Ordered in ED Medications  Chlorhexidine Gluconate Cloth 2 % PADS 6 each (has no administration in time range)     Initial Impression / Assessment and Plan / ED Course  I have reviewed the triage vital signs and the nursing notes.  Pertinent labs & imaging results that were available during my care of the patient were reviewed by me and considered in my medical decision making (see chart for details).  Clinical Course as of Jul 08 1599  Thu Jul 07, 2017  1159 Patient states he has midline cervical spine tenderness. Per  patient's wife patient's speech is slower than normal.  Patient on Plavix.  Have ordered CT head and C-spine, hard c-collar.   [BM]  1320 CT Head Wo Contrast [BM]    Clinical Course User  Index [BM] Deliah Boston, PA-C   Syncope versus seizure: Patient presenting after fall in parking lot on the way to dialysis today.  Patient states he became weak and collapsed to the ground, followed by a possible seizure per wife.  Patient transported to emergency department by wife.  Patient has history of end-stage renal disease and is on Plavix.  CT scan of head was performed due to concern for bleeding due to patient being on Plavix and having somewhat slower speech per wife.  CT scan of neck was performed after patient complained of midline cervical spine tenderness.  Both scans were found to have no acute intracranial findings or acute cervical spine findings.  Consults have been called by Dr. Ralene Bathe to admit the patient to medicine service. Dr. Ralene Bathe completing disposition.  Final Clinical Impressions(s) / ED Diagnoses   Final diagnoses:  None    ED Discharge Orders    None       Gari Crown 07/07/17 1609    Quintella Reichert, MD 07/11/17 1154

## 2017-07-07 NOTE — ED Notes (Signed)
IV attempted without success. 

## 2017-07-07 NOTE — Procedures (Signed)
I have personally attended this patient's dialysis session.   No heparin (post fall - multiple abrasions, bruising) Keep even Transfuse 1 unit for Hb 7.2 Start Aranesp give 200 tonight (not on ESA at [facility])  Jamal Maes, MD [place] Pager 07/07/2017, 8:14 PM

## 2017-07-07 NOTE — ED Notes (Signed)
IV team at bedside 

## 2017-07-08 ENCOUNTER — Other Ambulatory Visit (HOSPITAL_COMMUNITY): Payer: Non-veteran care

## 2017-07-08 LAB — TROPONIN I
TROPONIN I: 0.12 ng/mL — AB (ref <0.03)
Troponin I: 0.13 ng/mL (ref <0.03)
Troponin I: 0.14 ng/mL (ref <0.03)
Troponin I: 0.15 ng/mL (ref <0.03)

## 2017-07-08 LAB — COMPREHENSIVE METABOLIC PANEL
ALBUMIN: 3.2 g/dL — AB (ref 3.5–5.0)
ALK PHOS: 76 U/L (ref 38–126)
ALT: 27 U/L (ref 17–63)
ANION GAP: 11 (ref 5–15)
AST: 35 U/L (ref 15–41)
BUN: 28 mg/dL — AB (ref 6–20)
CALCIUM: 8.4 mg/dL — AB (ref 8.9–10.3)
CO2: 27 mmol/L (ref 22–32)
Chloride: 98 mmol/L — ABNORMAL LOW (ref 101–111)
Creatinine, Ser: 2.72 mg/dL — ABNORMAL HIGH (ref 0.61–1.24)
GFR calc Af Amer: 25 mL/min — ABNORMAL LOW (ref >60)
GFR calc non Af Amer: 22 mL/min — ABNORMAL LOW (ref >60)
GLUCOSE: 114 mg/dL — AB (ref 65–99)
POTASSIUM: 3.7 mmol/L (ref 3.5–5.1)
SODIUM: 136 mmol/L (ref 135–145)
Total Bilirubin: 0.5 mg/dL (ref 0.3–1.2)
Total Protein: 6.3 g/dL — ABNORMAL LOW (ref 6.5–8.1)

## 2017-07-08 LAB — CBC
HCT: 26.4 % — ABNORMAL LOW (ref 39.0–52.0)
HEMOGLOBIN: 8.3 g/dL — AB (ref 13.0–17.0)
MCH: 29.7 pg (ref 26.0–34.0)
MCHC: 31.4 g/dL (ref 30.0–36.0)
MCV: 94.6 fL (ref 78.0–100.0)
Platelets: 209 10*3/uL (ref 150–400)
RBC: 2.79 MIL/uL — ABNORMAL LOW (ref 4.22–5.81)
RDW: 16.8 % — ABNORMAL HIGH (ref 11.5–15.5)
WBC: 10.5 10*3/uL (ref 4.0–10.5)

## 2017-07-08 LAB — GLUCOSE, CAPILLARY
GLUCOSE-CAPILLARY: 110 mg/dL — AB (ref 65–99)
GLUCOSE-CAPILLARY: 192 mg/dL — AB (ref 65–99)
Glucose-Capillary: 136 mg/dL — ABNORMAL HIGH (ref 65–99)
Glucose-Capillary: 180 mg/dL — ABNORMAL HIGH (ref 65–99)
Glucose-Capillary: 211 mg/dL — ABNORMAL HIGH (ref 65–99)

## 2017-07-08 LAB — CK TOTAL AND CKMB (NOT AT ARMC)
CK TOTAL: 72 U/L (ref 49–397)
CK, MB: 4.7 ng/mL (ref 0.5–5.0)
RELATIVE INDEX: INVALID (ref 0.0–2.5)

## 2017-07-08 LAB — PROTIME-INR
INR: 1.05
Prothrombin Time: 13.6 seconds (ref 11.4–15.2)

## 2017-07-08 LAB — TSH: TSH: 0.878 u[IU]/mL (ref 0.350–4.500)

## 2017-07-08 LAB — APTT: APTT: 34 s (ref 24–36)

## 2017-07-08 MED ORDER — MUSCLE RUB 10-15 % EX CREA
TOPICAL_CREAM | CUTANEOUS | Status: DC | PRN
  Filled 2017-07-08: qty 85

## 2017-07-08 NOTE — Progress Notes (Signed)
Patient has had labs drawn previously this am.  Lab tech in to draw more labs as more orders are added on and patient is declining to be stuck this, multiple times. Patient states the next lab stick will be in the AM only.

## 2017-07-08 NOTE — Progress Notes (Signed)
Troponin 0.15. Paged MD on call. Patient denies chest pain 0/10.  Awaiting on possible orders.  Tamecia Mcdougald, RN

## 2017-07-08 NOTE — Consult Note (Signed)
   [facility] CM Inpatient Consult   07/08/2017  YOSHIO SELIGA [birth year] [id number]   Patient was screened for [facility] Care Management. Chart review reveals that the patient's primary care provider is the [facility] and that is where he receives his dialysis as well. Also, patient has not been active with [facility] in several years.   Patient is not currently a beneficiary of the attributed [place] in the [employer].  Hisr Primary Care Provider is not a [facility] primary care provider or is not [facility] affiliated.   This patient is Not eligible for [facility] Care Management Services.   Reason:  Not a beneficiary currently attributed to one of the [place].    Natividad Brood, RN BSN [facility] Liaison  [phone] business mobile phone Toll free office [phone]

## 2017-07-08 NOTE — Progress Notes (Signed)
Troponin 0.13. Patient denies chest pain 0/10. Paged MD on call. No ne orders at this time.  Irean Kendricks, RN

## 2017-07-08 NOTE — Progress Notes (Signed)
Family Medicine Teaching Service Daily Progress Note Intern Pager: [phone]  Patient name: Mitchell Garcia Medical record number: [id number] Date of birth: 16-Jan-1946 Age: 72 y.o. Gender: male  Primary Care Provider: [name], MD Consultants: Nephrology Code Status: Full  Pt Overview and Major Events to Date:  Mitchell Garcia is a 72 y.o. male presenting with a witnessed syncopal episode. PMH is significant for ESRD on HD TTS, Anemia of Chronic Disease, HFrEF, CAD s/p stent x 2, DMT2, HTN, Hx of stroke in 1994.  Assessment and Plan:   Syncope. Cardiac etiology is considered as patient has history of HFrEF and CAD s/p stent however he has not had any cardiac symptoms 48 prior and since admission. Troponin initially was 0.18 but has been elevated in the past and repeat EKG is abnormal this am (6/14) with concerning ST-segment elevation changes from baseline. However, no symptoms and repeat EKG was back to his baseline with no rise in Troponins. CT of Head showed no acute changes. Most likely due to orthostatic as patient is reporting dizziness on standing ever since he started HD in May. - Trending Troponin; down trending - Repeat EKG in am - some changes in V1 but otherwise no change from his baseline - Orthostatic vitals - Consider starting midodrine with dialysis  Anemia. Acute on Chronic, in the setting of anemia of CKD. Concern for symptomatic anemia since Hgb of 7.2 on admission, baseline of around 9-10. His Hgb has been down trending for the past few months. Last iron panel was significant for Iron of 161, TIBC 203, Ferritin of 150, consistent with anemia of chronic disease. - Hgb 8.3 s/p 1U PRBC - daily CBC  Rectal Bleeding. Hx of hemorrhoids that he states has improved in the last few day that had been bleeding. Endorses none since "a few days ago". Has never had a colonoscopy "I dont get those things". - FOBT - Colonoscopy outpatient  Incidental Finding of Enlarged  Thyroid Nodule on CT. TSH normal. Obtaining T3/T4 today. - T3 and T4 pending - U/S to obtain T-RADS score, possibly FNA Biopsy (likely outpatient)  ESRD on HD. Chronic. Recently started dialysis several months ago. - Nephrology consulted, appreciate recs - monitor Scr  Chronic Leg Wound. Chronic. Patient has been receiving Vanc and Cefepime every other day with dialysis at the [facility] center. - Cont Vanc and Cefepime dosing per pharmacy; he was getting with dialysis - wound care consult  Chronic Pain. Patient having neuropathy pain in his LE this am. Takes Gabapentin at home. 300mg  at night. - Cont Gabapentin 100mg  qhs  HFrEF. Chronic, stable. Last Echo was 10/04/2016 significant for LVEF 35% with diffuse hypokinesis and grade 1 diastolic dysfunction. - See syncopal problem above - Repeat ECHO pending  CAD s/p stent x 2. Chronic, stable. No current symptoms. Left Heart Cath on 9/14 significant for multivessel disease with proximal LAD stenosis of 70%, total occlusion of mid LCx, and 95% of RCA stenosis. On statin, Nitro, Coreg,  - Cont ASA and Plavix for at least one year post PCI. - monitor with Telemetry - Cont Atorvastatin 80mg   HTN. Chronic. Well controlled this am (6/14). SBP 95-134 with DBP 39-76. On Coreg at home. - Cont Coreg 3.125 BID  DM. Chronic. On home Levemir 18U BID and Novolog mix 70/30 30-40. Fasting BG this am (6/14) was 110. - monitor CBGs - sSSI  Tremors. Chronic, stable. On primidone at home. - cont primidone 100mg  daily  FEN/GI: Renal/Carb modified PPx: Heparin  Disposition:  inpatient  Subjective:  This am patient states he feels well. He had a little nausea which he attributed to "getting stuck so many times" for blood draw. Otherwise, he states he is doing well. He denies chest pain, dizziness, nausea, vomiting, any pain radiating to his arm, jaw, or back shoulder. He has no SOB and no difficulty breathing. Endorses chronic neuropathy of  LE.  Objective: Temp:  [97.5 F (36.4 C)-98.6 F (37 C)] 97.8 F (36.6 C) (06/14 0622) Pulse Rate:  [48-82] 74 (06/14 0622) Resp:  [13-25] 15 (06/14 0622) BP: (95-134)/(39-76) 113/64 (06/14 0622) SpO2:  [93 %-100 %] 98 % (06/14 0622) Weight:  [217 lb 4 oz (98.5 kg)-219 lb 5.7 oz (99.5 kg)] 217 lb 4 oz (98.5 kg) (06/14 0622) Physical Exam: Gen: Alert and Oriented x 3, NAD HEENT: Normocephalic, atraumatic, PERRLA, EOMI CV: RRR, no murmurs, normal S1, S2 split, +2 pulses dorsalis pedis bilaterally Resp: decreased breath sounds bilaterally, no wheezing, rales, or rhonchi, comfortable work of breathing Abd: non-distended, non-tender, soft, +bs in all four quadrants, no hepatosplenomegaly MSK: Moves all four extremities Ext: no clubbing, cyanosis, +2 edema bilaterally in LE Neuro: CN II-XII intact, no focal or gross deficits Skin: warm, dry, intact, chronic dark back right calf wound, chronic venous stasis skin changes to LE  Laboratory: Recent Labs  Lab 07/07/17 0949 07/08/17 0201  WBC 9.7 10.5  HGB 7.2* 8.3*  HCT 24.0* 26.4*  PLT 241 209   Recent Labs  Lab 07/07/17 0949 07/08/17 0201  NA 141 136  K 4.4 3.7  CL 102 98*  CO2 21* 27  BUN 74* 28*  CREATININE 5.38* 2.72*  CALCIUM 9.0 8.4*  PROT 7.1 6.3*  BILITOT 0.4 0.5  ALKPHOS 80 76  ALT 25 27  AST 41 35  GLUCOSE 153* 114*   TSH: 0.878 INR/PT: 13.6/1.05 PTT: 34 Magnesium: 2.5 I-stat Troponin: 0.18 Troponin x 3: 0.13 >  EKG: po U/A: pending FOBT: pending  Imaging/Diagnostic Tests: CT Head/Cervical Spine IMPRESSION: 1. No acute intracranial findings or acute cervical spine findings. 2. 2.4 cm right thyroid lobe nodule. Consider further evaluation with thyroid ultrasound. If patient is clinically hyperthyroid, consider nuclear medicine thyroid uptake and scan. 3. Cervical spondylosis and degenerative disc disease with potential impingement at C3-4, C4-5, C5-6, and C6-7. 4. Atherosclerosis. 5. Upper normal  sized right upper paratracheal lymph node, nonspecific. 6. Chronic ethmoid sinusitis. There is also opacification of visualized right maxillary sinus.  CXR IMPRESSION: No acute findings.  Stable cardiomegaly.  Right IJ central venous catheter unchanged.  ECHO 2D: Pending  Nuala Alpha, DO 07/08/2017, 7:06 AM PGY-1, [place] Intern pager: [phone], text pages welcome

## 2017-07-08 NOTE — Progress Notes (Signed)
No new orders. Will continue to monitor.  Cleveland Yarbro, RN    

## 2017-07-08 NOTE — Progress Notes (Signed)
No new orders per MD. Continue to observe. VS stable. Patient denies chest pain 0/10.  Nancy Manuele, RN

## 2017-07-08 NOTE — Progress Notes (Signed)
Page MD about abnormal EKG this morning.  Awaiting on possible orders.  Rai Sinagra, RN

## 2017-07-08 NOTE — Progress Notes (Signed)
Admit: 07/07/2017 LOS: 1  71M [facility]D [facility] [place] admit wity syncope/fall; anemia; wound on R leg  Subjective:  HD overnight, net even, rec 1u PRBC Hb 8.3 this AM Says R knee hurts from fall  Outpatient HD Prescription [facility] [phone] Extension [phone] Charge RN) TTS 3K2.5 Ca 138Na 3hrs 45 min 400/800 37 temp EDW 97 kg (usually gets there) "Prn challenge by 0.5 as tolerated" TDC 1.6 ml hep dwell  3000 bolus heparin, no maintenance 2 mcg Hectorol 2 mcg TTS Ferrlecit 125 mg once a week No ESA  Vanco 1 gm and cefipime 2 gm (started 07/05/17) to go through the 19th for LE "cellulitus"+  06/13 0701 - 06/14 0700 In: 485 [P.O.:120; I.V.:50; Blood:315] Out: 5   Filed Weights   07/07/17 2025 07/08/17 0027 07/08/17 0622  Weight: 99.5 kg (219 lb 5.7 oz) 99.5 kg (219 lb 5.7 oz) 98.5 kg (217 lb 4 oz)    Scheduled Meds: . aspirin EC  81 mg Oral Daily  . atorvastatin  80 mg Oral QHS  . carvedilol  3.125 mg Oral BID WC  . Chlorhexidine Gluconate Cloth  6 each Topical Q0600  . clopidogrel  75 mg Oral Daily  . darbepoetin (ARANESP) injection - DIALYSIS  200 mcg Intravenous Q Thu-HD  . gabapentin  300 mg Oral QHS  . heparin  5,000 Units Subcutaneous Q8H  . insulin aspart  0-9 Units Subcutaneous TID WC  . isosorbide-hydrALAZINE  1 tablet Oral BID  . primidone  100 mg Oral Daily  . sevelamer carbonate  1,600 mg Oral TID WC   Continuous Infusions: . sodium chloride    . ceFEPime (MAXIPIME) IV Stopped (07/08/17 0219)  . vancomycin 1,000 mg (07/07/17 2320)   PRN Meds:.acetaminophen **OR** acetaminophen, ALPRAZolam, MUSCLE RUB, nitroGLYCERIN, senna-docusate  Current Labs: reviewed    Physical Exam:  Blood pressure 113/64, pulse 74, temperature 97.8 F (36.6 C), temperature source Oral, resp. rate 15, height 5\' 11"  (1.803 m), weight 98.5 kg (217 lb 4 oz), SpO2 98 %. NAD  R Post calf with quarter sized black eschar and surroudnign mild pink erythema RRR CTAB L  cheek abrasions Knees and L elbow bandaged R IJ TDC  A  1. Syncope/Fall at home. No acute findings on Head CT. Further workup per primary. TTE today 2. ESRD. TTS. On schedule. No heparin; try to push to EDW next Tx 3. HTN/Volume. BP controlled. Home meds Carvedilol 3.125, Bidil 20-37.5. UF to dry weight as tolerated. Keep SBP >100 on HD.  4. Anemia. Hgb 7.2 with history of rectal bleeding ad admission rec 1u PRBC. Oupatient center reports Hgb 9.5 on 6/6. Not on ESA at center per RN at [place] stool cards. Started Aranesp here. Hold IV Fe while giving abx.  5. MBD. Renvela 1600 qac tid at outpatient center, continue here. Follow labs.  6. R leg "cellulitis". Getting IV antibiotics at outpatient dialysis center. Necrotic eschar concerning for calciphylaxis (at time of start of HD April PTH was >400, phos 10.9 so clearly had the substrate for calciphylaxis. They have discussed with outpatient nephrologist. Given this will hold hectorol, P pending, continue non ca based binders 7. CAD s/p PCI on Plavix, aspirin 8. DM Insulin per primary  9. Enlarged R thyroid nodule on CT - per primary     Pearson Grippe MD 07/08/2017, 9:15 AM  Recent Labs  Lab 07/07/17 0949 07/08/17 0201  NA 141 136  K 4.4 3.7  CL 102 98*  CO2 21*  27  GLUCOSE 153* 114*  BUN 74* 28*  CREATININE 5.38* 2.72*  CALCIUM 9.0 8.4*   Recent Labs  Lab 07/07/17 0949 07/08/17 0201  WBC 9.7 10.5  HGB 7.2* 8.3*  HCT 24.0* 26.4*  MCV 99.2 94.6  PLT 241 209

## 2017-07-08 NOTE — Progress Notes (Signed)
Pt admitted on 07/07/17 with resolving wound to right posterior lower leg. OPen ot air, no drainage. Area with brown/black center.  Surrounding tissue not threatened and WNL

## 2017-07-08 NOTE — Progress Notes (Signed)
HD tx initiated via HD cath w/o problem, pull/push/flush equally w/o problem, VSS but w/ softer bp than reported, pt is to get 1 unit PRBC on HD tx, will cont to monitor while on HD tx

## 2017-07-08 NOTE — Progress Notes (Signed)
HD tx completed @ 0020 w/o problem, received 1 unit PRBC on HD tx w/ problem, no s/s of reaction, UF goal met, blood rinsed back, pt had multiple abx sent to HD unit that weren't ordered when I checked MAR during report, Vancomycin and Cefepime were ordered, called pharm to check if they were compatible but they were not so I gave the vancomycin during the last hour of tx and reported to primary nurse that the cefepime would have to be given on the floor as I couldn't run both during the last hour of tx and couldn't run either earlier or it would dialyze out, there was no order to ok extending HD tx to run the additional abx. Will transition to days shift to check w/ MD about the additional tx time to run both or to get pharm to remove "during Hemodialysis" in their order. Report called to [employer], RN

## 2017-07-08 NOTE — Care Management Note (Addendum)
Case Management Note  Patient Details  Name: KYAL ARTS MRN: [id number] Date of Birth: [birth year]  Subjective/Objective:    Admitted with syncopal episode. PMH is significant for ESRD on HD TTS, Anemia of Chronic Disease, HFrEF, CAD s/p stent x 2, DMT2, HTN, stroke in 1994. Resides with wife.PTA independent with ADL'. Owns cane walker.      Sam Overbeck (Spouse)         [phone]                [facility] Dr.  [name]  Action/Plan: Transition to home when medically stable with the resumption of home health services.... NCM following for transitional care needs. NCM alerted [phone]. [phone])) of pt's admission via voicemail. April to call back with pt's PCP's fax # . Pt receives his medication from the [place]. RX meds will need to be faxed to PCP for rewrite of medications.   Pt states has transportation to home @ d/c.  Expected Discharge Date:                  Expected Discharge Plan:  Home/Self Care(Resides with wife, Fraser Din)  In-House Referral:     Discharge planning Services  CM Consult  Post Acute Care Choice:  Home Health, Resumption of Svcs/PTA Provider Choice offered to:  Patient  DME Arranged:  (owns cane, walker) DME Agency:     HH Arranged:  RN, PT [place] Agency:  [facility] (formerly [employer])  Status of Service:  In process  If discussed at Long Length of Stay Meetings, dates discussed:    Additional Comments:  Sharin Mons, RN 07/08/2017, 3:56 PM

## 2017-07-08 NOTE — Progress Notes (Signed)
Skin:  Pt has gauze dressings to bilateral knees for abrasions rec'd in fall yesterday.  Left facial cheek bruised. Left elbow with small skin tear and dressing to cover.  Pt also has  area to posterior right leg that is resolved. Center of this area is brown /black eschar. Area flush with skin, no drainage. Open to air.

## 2017-07-08 NOTE — Progress Notes (Signed)
CALL PAGER [phone] for any questions or notifications regarding this patient  FMTS Attending Note: Dorcas Mcmurray MD On rounds this morning we noted abnormal EKG. Regarding the EKG in his chart that was done and resulted as below: We immediately evaluated patient and had EKG repeated which was same as prior EKG. He had no pain. His troponin had been trending downward. I think this EKG was not properly done and is incorrect. I "Suspect arm lead reversal, interpretation assumes no reversal Sinus rhythm with 1st degree A-V block with frequent Premature ventricular complexes Low voltage QRS Lateral infarct , possibly acute Inferior infarct , possibly acute T wave abnormality, consider anterior ischemia Prolonged QT ACUTE MI / STEMI  Abnormal ECG ... 12mm/s 24mm/mV 100Hz  9.0.4 12SL 241 HD CID: [id number] Referred by: MD Unconfirmed Vent. rate 82 BPM"

## 2017-07-09 ENCOUNTER — Inpatient Hospital Stay (HOSPITAL_COMMUNITY): Payer: Medicare Other

## 2017-07-09 ENCOUNTER — Other Ambulatory Visit: Payer: Self-pay

## 2017-07-09 DIAGNOSIS — I361 Nonrheumatic tricuspid (valve) insufficiency: Secondary | ICD-10-CM

## 2017-07-09 LAB — URINALYSIS, ROUTINE W REFLEX MICROSCOPIC
Bilirubin Urine: NEGATIVE
Glucose, UA: 50 mg/dL — AB
Ketones, ur: NEGATIVE mg/dL
Nitrite: NEGATIVE
PROTEIN: 100 mg/dL — AB
Specific Gravity, Urine: 1.015 (ref 1.005–1.030)
pH: 5 (ref 5.0–8.0)

## 2017-07-09 LAB — HEMOGLOBIN AND HEMATOCRIT, BLOOD
HEMATOCRIT: 27.1 % — AB (ref 39.0–52.0)
Hemoglobin: 8.7 g/dL — ABNORMAL LOW (ref 13.0–17.0)

## 2017-07-09 LAB — CBC
HEMATOCRIT: 24.1 % — AB (ref 39.0–52.0)
Hemoglobin: 7.5 g/dL — ABNORMAL LOW (ref 13.0–17.0)
MCH: 29.6 pg (ref 26.0–34.0)
MCHC: 31.1 g/dL (ref 30.0–36.0)
MCV: 95.3 fL (ref 78.0–100.0)
Platelets: 189 10*3/uL (ref 150–400)
RBC: 2.53 MIL/uL — ABNORMAL LOW (ref 4.22–5.81)
RDW: 16.6 % — AB (ref 11.5–15.5)
WBC: 7.9 10*3/uL (ref 4.0–10.5)

## 2017-07-09 LAB — GLUCOSE, CAPILLARY
GLUCOSE-CAPILLARY: 167 mg/dL — AB (ref 65–99)
GLUCOSE-CAPILLARY: 143 mg/dL — AB (ref 65–99)
Glucose-Capillary: 202 mg/dL — ABNORMAL HIGH (ref 65–99)
Glucose-Capillary: 276 mg/dL — ABNORMAL HIGH (ref 65–99)

## 2017-07-09 LAB — ECHOCARDIOGRAM COMPLETE
Height: 71 in
WEIGHTICAEL: 3499.2 [oz_av]

## 2017-07-09 LAB — BASIC METABOLIC PANEL
ANION GAP: 13 (ref 5–15)
BUN: 48 mg/dL — ABNORMAL HIGH (ref 6–20)
CALCIUM: 8.4 mg/dL — AB (ref 8.9–10.3)
CO2: 25 mmol/L (ref 22–32)
CREATININE: 4.11 mg/dL — AB (ref 0.61–1.24)
Chloride: 96 mmol/L — ABNORMAL LOW (ref 101–111)
GFR calc Af Amer: 15 mL/min — ABNORMAL LOW (ref >60)
GFR calc non Af Amer: 13 mL/min — ABNORMAL LOW (ref >60)
Glucose, Bld: 169 mg/dL — ABNORMAL HIGH (ref 65–99)
Potassium: 4 mmol/L (ref 3.5–5.1)
Sodium: 134 mmol/L — ABNORMAL LOW (ref 135–145)

## 2017-07-09 LAB — T4, FREE: FREE T4: 1.18 ng/dL (ref 0.82–1.77)

## 2017-07-09 LAB — PREPARE RBC (CROSSMATCH)

## 2017-07-09 LAB — HEPATITIS B SURFACE ANTIGEN: HEP B S AG: NEGATIVE

## 2017-07-09 LAB — TROPONIN I: Troponin I: 0.16 ng/mL (ref <0.03)

## 2017-07-09 MED ORDER — PERFLUTREN LIPID MICROSPHERE
3.0000 mL | INTRAVENOUS | Status: DC | PRN
  Filled 2017-07-09: qty 4

## 2017-07-09 MED ORDER — VANCOMYCIN HCL IN DEXTROSE 1-5 GM/200ML-% IV SOLN
INTRAVENOUS | Status: AC
  Administered 2017-07-09: 1000 mg via INTRAVENOUS
  Filled 2017-07-09: qty 200

## 2017-07-09 MED ORDER — SODIUM CHLORIDE 0.9 % IV SOLN
Freq: Once | INTRAVENOUS | Status: AC
  Administered 2017-07-09: 10:00:00 via INTRAVENOUS

## 2017-07-09 NOTE — Progress Notes (Signed)
Troponin 0.16. Patient asleep, no pain 0/10. Paged MD on call, no new orders, no call back.  Will continue to monitor.  Lilee Aldea, RN

## 2017-07-09 NOTE — Progress Notes (Signed)
Admit: 07/07/2017 LOS: 2  41M [facility]D [facility] [place] admit wity syncope/fall; anemia; wound on R leg  Subjective:   No new events, patient feels well without complaints  For dialysis today  TTE pending this morning  Outpatient HD Prescription [facility] [phone] Extension [phone] Charge RN) TTS 3K2.5 Ca 138Na 3hrs 45 min 400/800 37 temp EDW 97 kg (usually gets there) "Prn challenge by 0.5 as tolerated" TDC 1.6 ml hep dwell  3000 bolus heparin, no maintenance 2 mcg Hectorol 2 mcg TTS Ferrlecit 125 mg once a week No ESA  Vanco 1 gm and cefipime 2 gm (started 07/05/17) to go through the 19th for LE "cellulitus"+  06/14 0701 - 06/15 0700 In: 1180 [P.O.:1180] Out: 500 [Urine:500]  Filed Weights   07/08/17 0027 07/08/17 0622 07/09/17 0546  Weight: 99.5 kg (219 lb 5.7 oz) 98.5 kg (217 lb 4 oz) 99.2 kg (218 lb 11.2 oz)    Scheduled Meds: . aspirin EC  81 mg Oral Daily  . atorvastatin  80 mg Oral QHS  . carvedilol  3.125 mg Oral BID WC  . Chlorhexidine Gluconate Cloth  6 each Topical Q0600  . darbepoetin (ARANESP) injection - DIALYSIS  200 mcg Intravenous Q Thu-HD  . gabapentin  300 mg Oral QHS  . heparin  5,000 Units Subcutaneous Q8H  . insulin aspart  0-9 Units Subcutaneous TID WC  . isosorbide-hydrALAZINE  1 tablet Oral BID  . primidone  100 mg Oral Daily  . sevelamer carbonate  1,600 mg Oral TID WC   Continuous Infusions: . sodium chloride    . sodium chloride    . ceFEPime (MAXIPIME) IV Stopped (07/08/17 0219)  . vancomycin 1,000 mg (07/07/17 2320)   PRN Meds:.acetaminophen **OR** acetaminophen, ALPRAZolam, MUSCLE RUB, nitroGLYCERIN, perflutren lipid microspheres (DEFINITY) IV suspension, senna-docusate  Current Labs: reviewed    Physical Exam:  Blood pressure 108/73, pulse 60, temperature 97.8 F (36.6 C), temperature source Oral, resp. rate 20, height 5\' 11"  (1.803 m), weight 99.2 kg (218 lb 11.2 oz), SpO2 96 %. NAD  R Post calf with quarter  sized black eschar and surroudnign mild pink erythema RRR CTAB L cheek abrasions Knees and L elbow bandaged R IJ TDC  A  1. Syncope/Fall at home. No acute findings on Head CT. Further workup per primary. TTE today 2. ESRD. TTS. On schedule. No heparin; try to push to EDW 3. HTN/Volume. BP controlled. Home meds Carvedilol 3.125, Bidil 20-37.5. UF to dry weight as tolerated. Keep SBP >100 on HD.  4. Anemia. Hgb 7s with history of rectal bleeding ad admission rec 1u PRBC. Oupatient center reports Hgb 9.5 on 6/6. Not on ESA at center per RN at [place] stool cards. Started Aranesp here. Hold IV Fe while giving abx.  5. MBD. Renvela 1600 qac tid at outpatient center, continue here. Follow labs.  6. R leg "cellulitis". Getting IV antibiotics at outpatient dialysis center. Necrotic eschar concerning for calciphylaxis (at time of start of HD April PTH was >400, phos 10.9 so clearly had the substrate for calciphylaxis. They have discussed with outpatient nephrologist. Given this will hold hectorol, P pending, continue non ca based binders.  Would continue antibiotics as written by outpatient nephrologist and they can follow-up on this 7. CAD s/p PCI on Plavix, aspirin 8. DM Insulin per primary  9. Enlarged R thyroid nodule on CT - per primary     Pearson Grippe MD 07/09/2017, 12:33 PM  Recent Labs  Lab 07/07/17 [phone] 07/08/17  0201 07/09/17 0110  NA 141 136 134*  K 4.4 3.7 4.0  CL 102 98* 96*  CO2 21* 27 25  GLUCOSE 153* 114* 169*  BUN 74* 28* 48*  CREATININE 5.38* 2.72* 4.11*  CALCIUM 9.0 8.4* 8.4*   Recent Labs  Lab 07/07/17 0949 07/08/17 0201 07/09/17 0110  WBC 9.7 10.5 7.9  HGB 7.2* 8.3* 7.5*  HCT 24.0* 26.4* 24.1*  MCV 99.2 94.6 95.3  PLT 241 209 189

## 2017-07-09 NOTE — Progress Notes (Signed)
Family Medicine Teaching Service Daily Progress Note Intern Pager: [phone]  Patient name: Mitchell Garcia Medical record number: [id number] Date of birth: 1945-05-29 Age: 72 y.o. Gender: male  Primary Care Provider: [name], MD Consultants: Nephrology Code Status: Full  Pt Overview and Major Events to Date:  Mitchell Garcia is a 72 y.o. male presenting with a witnessed syncopal episode. PMH is significant for ESRD on HD TTS, Anemia of Chronic Disease, HFrEF, CAD s/p stent x 2, DMT2, HTN, Hx of stroke in 1994.  Assessment and Plan:  Syncope. Cardiac etiology is considered as patient has history of HFrEF and CAD s/p stent however he has not had any cardiac symptoms 48 prior and since admission. Troponin initially was 0.18 and we have been trending. His Troponins decreased to 0.12 but then have slightly increased to 0.14, 0.15, and latest is 0.16. He still has no active symptoms however patient is a diabetic and could be having a silent MI. His syncope most likely due to orthostatics secondary to volume depletion at HD as patient is reporting dizziness on standing ever since he started HD in May. - Trending Troponin; slightly increased  - Echocardiogram pending - Orthostatic vitals negative - Consider starting midodrine with dialysis  Anemia. Acute on Chronic, in the setting of anemia of CKD. Concern for symptomatic anemia since Hgb of 7.2 on admission, baseline of around 9-10. Hgb this am is 7.5. His Hgb has been down trending for the past few months. Last iron panel was significant for Iron of 161, TIBC 203, Ferritin of 150, consistent with anemia of chronic disease. - Hgb 8.3 s/p 1U PRBC > 7.5 - Transfuse 1U PRBC today with dialysis - daily CBC  Rectal Bleeding. Hx of hemorrhoids that he states has improved in the last few day that had been bleeding. Endorses none since "a few days ago". Has never had a colonoscopy "I dont get those things". - FOBT - Colonoscopy  outpatient  Incidental Finding of Enlarged Thyroid Nodule on CT. TSH normal. Obtaining Free T4 today. - Free T4 pending - U/S to obtain T-RADS score, possibly FNA Biopsy (likely outpatient)  ESRD on HD. Chronic. Recently started dialysis several months ago. - Nephrology consulted, appreciate recs - monitor Scr  Chronic Leg Wound. Chronic. Patient has been receiving Vanc and Cefepime every other day with dialysis at the [facility] center. - Cont Vanc and Cefepime dosing per pharmacy; he was getting with dialysis - wound care consult  Chronic Pain. Patient having neuropathy pain in his LE this am. Takes Gabapentin at home. 300mg  at night. - Cont Gabapentin 100mg  qhs  HFrEF. Chronic, stable. Last Echo was 10/04/2016 significant for LVEF 35% with diffuse hypokinesis and grade 1 diastolic dysfunction. - See syncopal problem above - Repeat ECHO pending  CAD s/p stent x 2. Chronic, stable. No current symptoms. Left Heart Cath on 9/14 significant for multivessel disease with proximal LAD stenosis of 70%, total occlusion of mid LCx, and 95% of RCA stenosis. On statin, Nitro, Coreg,  - Cont ASA and Plavix for at least one year post PCI. - monitor with Telemetry - Cont Atorvastatin 80mg   HTN. Chronic. Well controlled this am (6/14). SBP 95-134 with DBP 39-76. On Coreg at home. - Cont Coreg 3.125 BID  DM. Chronic. On home Levemir 18U BID and Novolog mix 70/30 30-40. Fasting BG this am (6/14) was 110. - monitor CBGs - sSSI  Tremors. Chronic, stable. On primidone at home. - cont primidone 100mg  daily  FEN/GI: Renal/Carb modified  PPx: Heparin  Disposition: inpatient  Subjective:  This am patient states he feels good. He states he would like to go home tomorrow and would not like any more work up. He states "my heart is fine and I feel get". He is getting somewhat concerned he will be billed for all the tests and labs were are trying to obtain to discover the cause of his anemia.    Objective: Temp:  [97.5 F (36.4 C)-98.7 F (37.1 C)] 98.7 F (37.1 C) (06/15 0546) Pulse Rate:  [61-76] 67 (06/15 0546) Resp:  [12-20] 14 (06/15 0546) BP: (111-123)/(58-75) 120/58 (06/15 0546) SpO2:  [94 %-97 %] 94 % (06/15 0546) Weight:  [218 lb 11.2 oz (99.2 kg)] 218 lb 11.2 oz (99.2 kg) (06/15 0546) Physical Exam: Gen: Alert and Oriented x 3, NAD HEENT: Normocephalic, atraumatic, PERRLA, EOMI CV: RRR, no murmurs, normal S1, S2 split Resp: CTAB, no wheezing, rales, or rhonchi, comfortable work of breathing Abd: non-distended, non-tender, soft, +bs in all four quadrants MSK: Moves all four extremities Ext: no clubbing, cyanosis, area of calciphylaxis on the back right calf. +1-2 edema BLE. Skin: warm, dry, intact, no rashes  Laboratory: Recent Labs  Lab 07/07/17 0949 07/08/17 0201 07/09/17 0110  WBC 9.7 10.5 7.9  HGB 7.2* 8.3* 7.5*  HCT 24.0* 26.4* 24.1*  PLT 241 209 189   Recent Labs  Lab 07/07/17 0949 07/08/17 0201 07/09/17 0110  NA 141 136 134*  K 4.4 3.7 4.0  CL 102 98* 96*  CO2 21* 27 25  BUN 74* 28* 48*  CREATININE 5.38* 2.72* 4.11*  CALCIUM 9.0 8.4* 8.4*  PROT 7.1 6.3*  --   BILITOT 0.4 0.5  --   ALKPHOS 80 76  --   ALT 25 27  --   AST 41 35  --   GLUCOSE 153* 114* 169*   TSH: 0.878 INR/PT: 13.6/1.05 PTT: 34 Magnesium: 2.5 I-stat Troponin: 0.18 Troponin x 3: 0.13 >  EKG: po U/A: pending FOBT: pending  Imaging/Diagnostic Tests: CT Head/Cervical Spine IMPRESSION: 1. No acute intracranial findings or acute cervical spine findings. 2. 2.4 cm right thyroid lobe nodule. Consider further evaluation with thyroid ultrasound. If patient is clinically hyperthyroid, consider nuclear medicine thyroid uptake and scan. 3. Cervical spondylosis and degenerative disc disease with potential impingement at C3-4, C4-5, C5-6, and C6-7. 4. Atherosclerosis. 5. Upper normal sized right upper paratracheal lymph node, nonspecific. 6. Chronic ethmoid  sinusitis. There is also opacification of visualized right maxillary sinus.  CXR IMPRESSION: No acute findings.  Stable cardiomegaly.  Right IJ central venous catheter unchanged.  ECHO 2D: Pending  Nuala Alpha, DO 07/09/2017, 7:02 AM PGY-1, [place] Intern pager: [phone], text pages welcome

## 2017-07-09 NOTE — Progress Notes (Signed)
Pt. receiving blood through single IV site, tech unable to finish echo due to need of definity at this time.

## 2017-07-09 NOTE — Progress Notes (Signed)
Blood sugar 276. No coverage. Paged MD on call,retured call back, no new orders at this time.  Will continue to monitor.  Rebbeca Sheperd, RN

## 2017-07-09 NOTE — Progress Notes (Signed)
Pt educated about safety and importance of bed alarm during the night however pt refuses to be on bed alarm. Will continue to round on patient.   Surah Pelley, RN    

## 2017-07-10 ENCOUNTER — Other Ambulatory Visit: Payer: Self-pay | Admitting: Family Medicine

## 2017-07-10 LAB — CBC
HEMATOCRIT: 27.3 % — AB (ref 39.0–52.0)
HEMOGLOBIN: 8.5 g/dL — AB (ref 13.0–17.0)
MCH: 29.9 pg (ref 26.0–34.0)
MCHC: 31.1 g/dL (ref 30.0–36.0)
MCV: 96.1 fL (ref 78.0–100.0)
Platelets: 180 10*3/uL (ref 150–400)
RBC: 2.84 MIL/uL — ABNORMAL LOW (ref 4.22–5.81)
RDW: 16.3 % — AB (ref 11.5–15.5)
WBC: 7.3 10*3/uL (ref 4.0–10.5)

## 2017-07-10 LAB — PHOSPHORUS: Phosphorus: 5.4 mg/dL — ABNORMAL HIGH (ref 2.5–4.6)

## 2017-07-10 LAB — TYPE AND SCREEN
ABO/RH(D): A POS
Antibody Screen: NEGATIVE
UNIT DIVISION: 0
UNIT DIVISION: 0

## 2017-07-10 LAB — BPAM RBC
BLOOD PRODUCT EXPIRATION DATE: 201907042359
BLOOD PRODUCT EXPIRATION DATE: 201907052359
ISSUE DATE / TIME: 201906132106
ISSUE DATE / TIME: 201906151033
UNIT TYPE AND RH: 6200
UNIT TYPE AND RH: 6200

## 2017-07-10 LAB — GLUCOSE, CAPILLARY
Glucose-Capillary: 173 mg/dL — ABNORMAL HIGH (ref 65–99)
Glucose-Capillary: 239 mg/dL — ABNORMAL HIGH (ref 65–99)

## 2017-07-10 MED ORDER — SEVELAMER CARBONATE 800 MG PO TABS: 1600.0000 mg | ORAL_TABLET | Freq: Three times a day (TID) | ORAL | 0 refills | Status: AC

## 2017-07-10 NOTE — Progress Notes (Signed)
Discharges instruction given to patient and his wife at bedside, Had to call the MD for some clarifications on the binders the patient is taking. Dr made changes to his renvela and d/c the phoslo. All questions an concerns answered.

## 2017-07-10 NOTE — Care Management (Signed)
Mitchell Garcia, liaison for [facility], advised that patient is d/c today and will need [facility] services resumed.  Verified with bedside RN that patient needs [facility] RN services for monitoring of new wounds sustained in a fall.

## 2017-07-10 NOTE — Progress Notes (Signed)
Family Medicine Teaching Service Daily Progress Note Intern Pager: [phone]  Patient name: Mitchell Garcia Medical record number: [id number] Date of birth: 07-05-1945 Age: 72 y.o. Gender: male  Primary Care Provider: [name], MD Consultants: Nephrology Code Status: Full  Pt Overview and Major Events to Date:  Mitchell Garcia is a 72 y.o. male presenting with a witnessed syncopal episode. PMH is significant for ESRD on HD TTS, Anemia of Chronic Disease, HFrEF, CAD s/p stent x 2, DMT2, HTN, Hx of stroke in 1994.  Assessment and Plan:  Syncope His syncope most likely due to orthostatics secondary to volume depletion at HD as patient is reporting dizziness on standing ever since he started HD in May likely exacerbated by taking his blood pressure medication.  Cardiac work-up has been negative so far and troponin have reach a plateau.  Echocardiogram showed an EF 40 to 45% with diffuse hypokinesis and grade 2 diastolic dysfunction which is improved from last echo back in the fall 2018.  Patient was also anemic which could have contributed to symptoms. - Orthostatic vitals negative - Consider starting midodrine with dialysis  Anemia. Acute on Chronic, in the setting of anemia of CKD.  Concern for symptomatic anemia since Hgb of 7.2 on admission, baseline of around 9-10. Last iron panel was significant for Iron of 161, TIBC 203, Ferritin of 150, consistent with anemia of chronic disease.  Patient has received blood transfusion while inpatient with appropriate response.  This a.m. hemoglobin is 8.5 stable from the day prior 8.7. Patient would likely receive iron supplementation dialysis. - daily CBC  Rectal Bleeding Hx of hemorrhoids that he states has improved in the last few day that had been bleeding. Endorses none since "a few days ago". Has never had a colonoscopy.  Hemoglobin has been stable less concerning for acute GI bleed. --We will need outpatient GI work-up to rule out  bleed.   Incidental Finding of Enlarged Thyroid Nodule on CT  TSH normal and free T4 is within normal limits. -U/S to obtain T-RADS score, possibly FNA Biopsy (likely outpatient)  ESRD on HD. Recently started dialysis several months ago.  Patient is Tuesday, Thursday Saturday schedule.  Patient received HD yesterday and tolerated session well.  Patient was started on Renvela.  Seen by  by nephrology this morning who recommended discharge to his normal dialysis center.  Recommend holding blood pressure medication prior to getting HD to avoid hypotension. - Nephrology consulted, appreciate recs - monitor Scr  Chronic Leg Wound. Chronic. Patient has been receiving Vanc and Cefepime every other day with dialysis at the [facility] center.  Scheduled to complete course on 6/19. - Cont Vanc and Cefepime dosing per pharmacy; he was getting with dialysis - wound care consult  Chronic Pain. Patient having neuropathy pain in his LE this am. Takes Gabapentin at home. 300mg  at night. - Cont Gabapentin 100mg  qhs  HFrEF. Chronic, stable. Repeat echEchocardiogram showed an EF 40 to 45% with diffuse hypokinesis and grade 2 diastolic dysfunction which is improved from last echo back in the fall 2018.patient will continue medical management.  Discussed adjunct adjusting carvedilol dose given hypertension. - See syncopal problem above  CAD s/p stent x 2. Chronic, stable. No current symptoms. Left Heart Cath on 9/14 significant for multivessel disease with proximal LAD stenosis of 70%, total occlusion of mid LCx, and 95% of RCA stenosis. On statin, Nitro, Coreg,  - Cont ASA and Plavix for at least one year post PCI. - monitor with Telemetry -  Cont Atorvastatin 80mg   HTN. Chronic. Well controlled this am (6/14). SBP 95-134 with DBP 39-76. On Coreg at home. - Cont Coreg 3.125 BID -We will hold prior to getting HD given recent episodes of hypotension.  Patient should inform outpatient dialysis center as well as  physician for changes  DM. Chronic. On home Levemir 18U BID and Novolog mix 70/30 30-40. Fasting BG this am (6/14) was 110. - monitor CBGs - sSSI  Tremors. Chronic, stable. On primidone at home. - cont primidone 100mg  daily  FEN/GI: Renal/Carb modified PPx: Heparin  Disposition: inpatient  Subjective:  This am patient states he feels good.  Patient is ready for discharge.  Main concern has been blood pressure control.  Patient thinks that he is overmedicated and needs adjustment since he started dialysis.  Is otherwise taking good p.o. mentating appropriately and ambulating.  Objective: Temp:  [97.6 F (36.4 C)-99 F (37.2 C)] 98 F (36.7 C) (06/16 1125) Pulse Rate:  [56-75] 56 (06/16 1125) Resp:  [14-19] 18 (06/16 1125) BP: (109-122)/(47-68) 109/50 (06/16 1125) SpO2:  [95 %-96 %] 96 % (06/16 1125) Weight:  [216 lb 12.8 oz (98.3 kg)-217 lb 6 oz (98.6 kg)] 216 lb 12.8 oz (98.3 kg) (06/16 0617)   Physical Exam: Gen: Alert and Oriented x 3, NAD HEENT: Normocephalic, atraumatic, PERRLA, EOMI CV: RRR, no murmurs, normal S1, S2 split Resp: CTAB, no wheezing, rales, or rhonchi, comfortable work of breathing Abd: non-distended, non-tender, soft, +bs in all four quadrants MSK: Moves all four extremities Ext: no clubbing, cyanosis, area of calciphylaxis on the back right calf. +1-2 edema BLE. Skin: warm, dry, intact, no rashes  Laboratory: Recent Labs  Lab 07/08/17 0201 07/09/17 0110 07/09/17 2006 07/10/17 0622  WBC 10.5 7.9  --  7.3  HGB 8.3* 7.5* 8.7* 8.5*  HCT 26.4* 24.1* 27.1* 27.3*  PLT 209 189  --  180   Recent Labs  Lab 07/07/17 0949 07/08/17 0201 07/09/17 0110  NA 141 136 134*  K 4.4 3.7 4.0  CL 102 98* 96*  CO2 21* 27 25  BUN 74* 28* 48*  CREATININE 5.38* 2.72* 4.11*  CALCIUM 9.0 8.4* 8.4*  PROT 7.1 6.3*  --   BILITOT 0.4 0.5  --   ALKPHOS 80 76  --   ALT 25 27  --   AST 41 35  --   GLUCOSE 153* 114* 169*   TSH: 0.878 INR/PT: 13.6/1.05 PTT:  34 Magnesium: 2.5 I-stat Troponin: 0.18 Troponin x 3: 0.13 >  EKG: po U/A: pending FOBT: pending  Imaging/Diagnostic Tests: CT Head/Cervical Spine IMPRESSION: 1. No acute intracranial findings or acute cervical spine findings. 2. 2.4 cm right thyroid lobe nodule. Consider further evaluation with thyroid ultrasound. If patient is clinically hyperthyroid, consider nuclear medicine thyroid uptake and scan. 3. Cervical spondylosis and degenerative disc disease with potential impingement at C3-4, C4-5, C5-6, and C6-7. 4. Atherosclerosis. 5. Upper normal sized right upper paratracheal lymph node, nonspecific. 6. Chronic ethmoid sinusitis. There is also opacification of visualized right maxillary sinus.  CXR IMPRESSION: No acute findings.  Stable cardiomegaly.  Right IJ central venous catheter unchanged.    Marjie Skiff, MD 07/10/2017, 3:01 PM PGY-1, [place] Intern pager: [phone], text pages welcome

## 2017-07-10 NOTE — Progress Notes (Signed)
Pharmacy Antibiotic Note  Mitchell Garcia is a 72 y.o. male admitted on 07/07/2017 with wound infection Pharmacy has been consulted for Vancomycin and Cefepime dosing. Has been receiving outpatient.  HD schedule TThS. Tolerated full session on 6/15 and received ABX appropriately. Per MD, continue course until 6/19.  WBC wnl, afebrile  Plan: Vancomycin 1,000mg  IV qTThS w/ HD Cefepime 2g IV qTThS w/ HD Monitor clinical progress  Height: 5\' 11"  (180.3 cm) Weight: 216 lb 12.8 oz (98.3 kg) IBW/kg (Calculated) : 75.3  Temp (24hrs), Avg:98.1 F (36.7 C), Min:97.5 F (36.4 C), Max:99 F (37.2 C)  Recent Labs  Lab 07/07/17 0949 07/08/17 0201 07/09/17 0110 07/10/17 0622  WBC 9.7 10.5 7.9 7.3  CREATININE 5.38* 2.72* 4.11*  --     Estimated Creatinine Clearance: 19.1 mL/min (A) (by C-G formula based on SCr of 4.11 mg/dL (H)).    Allergies  Allergen Reactions  . Oxycodone Other (See Comments)    DIFFICULT TO WAKE UP  . Tramadol Nausea And Vomiting  . Lisinopril     Hyperkalemia   . Losartan     Hyperkalemia     Antimicrobials this admission: Vanc 6/11>>[6/19] Cefepime 6/11>>[6/19]   Microbiology results: 6/13: MRSA PCR neg   Nida Boatman, PharmD PGY1 Acute Care Pharmacy Resident 07/10/2017 8:31 AM

## 2017-07-10 NOTE — Progress Notes (Signed)
Admit: 07/07/2017 LOS: 3  3M ESRD THS [facility] admit wity syncope/fall; anemia; wound on R leg  Subjective:   HD Yesterday 2L UF, pt says it went well; post weight 98.6kg  Patient states he wants to go home  Discussed not taking carvedilol prior to dialysis  Hemoglobin 8.5 this morning  TTE yesterday, LVEF 40 to 45% with moderate LVH and grade 2 diastolic dysfunction  Outpatient HD Prescription [facility] [phone] Extension [phone] Charge RN) TTS 3K2.5 Ca 138Na 3hrs 45 min 400/800 37 temp EDW 97 kg (usually gets there) "Prn challenge by 0.5 as tolerated" TDC 1.6 ml hep dwell  3000 bolus heparin, no maintenance 2 mcg Hectorol 2 mcg TTS Ferrlecit 125 mg once a week No ESA  Vanco 1 gm and cefipime 2 gm (started 07/05/17) to go through the 19th for LE "cellulitus"+  06/15 0701 - 06/16 0700 In: 600 [P.O.:600] Out: 2000   Filed Weights   07/09/17 1240 07/09/17 1614 07/10/17 0617  Weight: 99 kg (218 lb 4.1 oz) 98.6 kg (217 lb 6 oz) 98.3 kg (216 lb 12.8 oz)    Scheduled Meds: . aspirin EC  81 mg Oral Daily  . atorvastatin  80 mg Oral QHS  . carvedilol  3.125 mg Oral BID WC  . Chlorhexidine Gluconate Cloth  6 each Topical Q0600  . darbepoetin (ARANESP) injection - DIALYSIS  200 mcg Intravenous Q Thu-HD  . gabapentin  300 mg Oral QHS  . heparin  5,000 Units Subcutaneous Q8H  . insulin aspart  0-9 Units Subcutaneous TID WC  . isosorbide-hydrALAZINE  1 tablet Oral BID  . primidone  100 mg Oral Daily  . sevelamer carbonate  1,600 mg Oral TID WC   Continuous Infusions: . sodium chloride    . ceFEPime (MAXIPIME) IV 2 g (07/09/17 1526)  . vancomycin 1,000 mg (07/09/17 1427)   PRN Meds:.acetaminophen **OR** acetaminophen, ALPRAZolam, MUSCLE RUB, nitroGLYCERIN, senna-docusate  Current Labs: reviewed    Physical Exam:  Blood pressure 114/68, pulse 65, temperature 98.8 F (37.1 C), temperature source Oral, resp. rate 14, height 5\' 11"  (1.803 m), weight  98.3 kg (216 lb 12.8 oz), SpO2 95 %. NAD  R Post calf with quarter sized black eschar and surroudnign mild pink erythema RRR CTAB L cheek abrasions Knees and L elbow bandaged R IJ TDC RC AVF +B/T  A  1. Syncope/Fall at home. No acute findings on Head CT. TTE as above,  2. ESRD. TTS. On schedule. No heparin; tolerating; should hold BP meds prior to HD 3. HTN/Volume. BP controlled. Home meds Carvedilol 3.125, Bidil 20-37.5; hold prior to dialysis. UF to dry weight as tolerated. Keep SBP >100 on HD. OK with DC and next HD Tues 4. Anemia. Hgb 7s with history of rectal bleeding ad admission rec 2u PRBC. Oupatient center reports Hgb 9.5 on 6/6. Not on ESA at center per RN at [facility] -certainly part of this is that he needs to be on erythropoietin  5. MBD. Renvela 1600 qac tid at outpatient center, continue here. P pending  6. R leg "cellulitis". Getting IV antibiotics at outpatient dialysis center. Necrotic eschar concerning for calciphylaxis (at time of start of HD April PTH was >400, phos 10.9 so clearly had the substrate for calciphylaxis. They have discussed with outpatient nephrologist. Given this will hold hectorol, P pending, continue non ca based binders.  Would continue antibiotics as written by outpatient nephrologist and they can follow-up on this.  Will also need some form of eval  for PAD 7. CAD s/p PCI on Plavix, aspirin 8. DM Insulin per primary  9. Enlarged R thyroid nodule on CT - per primary     Pearson Grippe MD 07/10/2017, 7:50 AM  Recent Labs  Lab 07/07/17 0949 07/08/17 0201 07/09/17 0110  NA 141 136 134*  K 4.4 3.7 4.0  CL 102 98* 96*  CO2 21* 27 25  GLUCOSE 153* 114* 169*  BUN 74* 28* 48*  CREATININE 5.38* 2.72* 4.11*  CALCIUM 9.0 8.4* 8.4*   Recent Labs  Lab 07/08/17 0201 07/09/17 0110 07/09/17 2006 07/10/17 0622  WBC 10.5 7.9  --  7.3  HGB 8.3* 7.5* 8.7* 8.5*  HCT 26.4* 24.1* 27.1* 27.3*  MCV 94.6 95.3  --  96.1  PLT 209 189  --  180

## 2017-07-10 NOTE — Progress Notes (Signed)
Pt was able to ambulate 200 ft with a walker, he tolerated well.

## 2017-07-11 NOTE — Discharge Summary (Signed)
[facility] Discharge Summary  Patient name: Mitchell Garcia Medical record number: [id number] Date of birth: 01-09-1946 Age: 72 y.o. Gender: male Date of Admission: 07/07/2017  Date of Discharge: 07/10/2017 Admitting Physician: Dickie La, MD  Primary Care Provider: [name], MD Consultants: Nephrology  Indication for Hospitalization:   Syncopal Event  Discharge Diagnoses/Problem List:   Syncope Anemia HFrEF ESRD on HD Rectal Bleeding Chronic Leg Wound CAD HTN DM  Disposition: home  Discharge Condition: medically stable  Discharge Exam:  Gen: Alert and Oriented x 3, NAD HEENT: Normocephalic, atraumatic, PERRLA, EOMI CV: RRR, no murmurs, normal S1, S2 split Resp: CTAB, no wheezing, rales, or rhonchi, comfortable work of breathing Abd: non-distended, non-tender, soft, +bs in all four quadrants MSK: Moves all four extremities Ext: no clubbing, cyanosis, area of calciphylaxis on the back right calf. +1-2 edema BLE. Skin: warm, dry, intact, no rashes  Brief Hospital Course:  Mr Mitchell Garcia is a 72y/o male with PMH of ESRD on HD TTS, Anemia of Chronic Disease, HFrEF, CAD s/p stent x 2, DMT2, HTN, Hx of stroke who presented due to a syncopal event. Mr Carmickle was started on HD about 3 months ago and since has had two episodes where he felt dizzy upon standing and weak. He states the Tuesday before his admission during dialysis his legs began to cramp and he felt very lightheaded afterwards. On Thursday he was on his way to dialysis and he felt weak again and called out to his wife however, he fell and does not remember hitting the ground. He awakened quickly. No seizure activity was noted and he exhibited no signs of a stroke. He was taken to the ED where his head CT was negative for any acute intracranial process. He had a full cardiology work up and although his troponins were elevated they remained stable. He had one abnormal EKG however a  repeat EKG was returned to normal and it is thought that the EKG change was due to lead reversal. He had no active chest pain or SOB during his hospital course. His ECHO showed LVEF of 40-45% with moderate LVH with left atrium severely dilated with no evidence of mitral valve stenosis or regurgitation.   His orthostatic vitals signs were negative. It is thought that his syncopal event if multifactorial. He likely had some mild dehydration from extra loses via dialysis and by not eating or drinking much that day. He was also found to be anemic and was give a total of 2u of PRBC so a part of his syncope could be due to his baseline anemia that had gotten acutely worse. Unclear if his anemia is secondary to his recent GI bleed that he reported as having resolved or if it is due to anemia of chronic disease from ESRD. Most likely a combination of both.   Issues for Follow Up:  1. Consider starting midodrine with dialysis to prevent him from becoming hypotensive and possibly preventing future syncope events. 2. Mr Seckman was found to have an enlarging Thyroid mass on CT. It is recommended that he have an ultrasound to stage the growth and based on those results he may need an FNA Biopsy. His TSH and free T4 were normal. 3. He reports a history of rectal bleeding and never having a colonoscopy. It is recommended he have a colonoscopy to work up his GI bleed.  Significant Procedures:   ECHO  Significant Labs and Imaging:  Recent Labs  Lab 07/08/17 0201  07/09/17 0110 07/09/17 2006 07/10/17 0622  WBC 10.5 7.9  --  7.3  HGB 8.3* 7.5* 8.7* 8.5*  HCT 26.4* 24.1* 27.1* 27.3*  PLT 209 189  --  180   Recent Labs  Lab 07/07/17 0949 07/08/17 0201 07/09/17 0110 07/10/17 0622  NA 141 136 134*  --   K 4.4 3.7 4.0  --   CL 102 98* 96*  --   CO2 21* 27 25  --   GLUCOSE 153* 114* 169*  --   BUN 74* 28* 48*  --   CREATININE 5.38* 2.72* 4.11*  --   CALCIUM 9.0 8.4* 8.4*  --   MG 2.5*  --   --   --    PHOS  --   --   --  5.4*  ALKPHOS 80 76  --   --   AST 41 35  --   --   ALT 25 27  --   --   ALBUMIN 3.5 3.2*  --   --    TSH: 0.878 INR/PT: 13.6/1.05 PTT: 34 Magnesium: 2.5 I-stat Troponin: 0.18 Troponin : 0.13 > 0.12 > 0.14 > 0.15 > 0.16  Results/Tests Pending at Time of Discharge:   FOBT  Discharge Medications:  Allergies as of 07/10/2017      Reactions   Oxycodone Other (See Comments)   DIFFICULT TO WAKE UP   Tramadol Nausea And Vomiting   Lisinopril    Hyperkalemia   Losartan    Hyperkalemia      Medication List    STOP taking these medications   calcium acetate 667 MG capsule Commonly known as:  PHOSLO     TAKE these medications   acetaminophen 325 MG tablet Commonly known as:  TYLENOL Take 2 tablets (650 mg total) by mouth every 4 (four) hours as needed for mild pain, fever or headache.   ALPRAZolam 0.5 MG tablet Commonly known as:  XANAX Take 1 tablet (0.5 mg total) by mouth at bedtime as needed for anxiety.   aspirin EC 81 MG tablet Take 81 mg by mouth daily.   atorvastatin 80 MG tablet Commonly known as:  LIPITOR Take 80 mg by mouth at bedtime.   benzonatate 100 MG capsule Commonly known as:  TESSALON PERLES Take 1 capsule (100 mg total) by mouth 3 (three) times daily as needed for cough.   calcitRIOL 0.5 MCG capsule Commonly known as:  ROCALTROL Take 1 capsule (0.5 mcg total) by mouth daily.   carvedilol 3.125 MG tablet Commonly known as:  COREG Take 1 tablet (3.125 mg total) by mouth 2 (two) times daily with a meal.   cholecalciferol 1000 units tablet Commonly known as:  VITAMIN D Take 1,000 Units by mouth daily.   clopidogrel 75 MG tablet Commonly known as:  PLAVIX Take 1 tablet (75 mg total) by mouth daily.   DSS 100 MG Caps Take 100 mg by mouth 2 (two) times daily.   gabapentin 300 MG capsule Commonly known as:  NEURONTIN Take 1 capsule (300 mg total) by mouth at bedtime.   guaiFENesin 600 MG 12 hr tablet Commonly known  as:  MUCINEX Take 1 tablet (600 mg total) by mouth 2 (two) times daily.   hydrocortisone 25 MG suppository Commonly known as:  ANUSOL-HC Place 1 suppository (25 mg total) rectally 2 (two) times daily as needed for hemorrhoids or anal itching.   insulin aspart protamine- aspart (70-30) 100 UNIT/ML injection Commonly known as:  NOVOLOG MIX 70/30 Inject 30-40 Units  into the skin 2 (two) times daily with a meal.   insulin detemir 100 UNIT/ML injection Commonly known as:  LEVEMIR Inject 0.18 mLs (18 Units total) into the skin 2 (two) times daily.   insulin lispro 100 UNIT/ML injection Commonly known as:  HUMALOG Inject subcutaneously as directed according to sliding scale instructions   isosorbide-hydrALAZINE 20-37.5 MG tablet Commonly known as:  BIDIL Take 1 tablet by mouth 2 (two) times daily.   multivitamin Tabs tablet Take 1 tablet by mouth at bedtime.   nitroGLYCERIN 0.4 MG SL tablet Commonly known as:  NITROSTAT Place 1 tablet (0.4 mg total) under the tongue every 5 (five) minutes x 3 doses as needed for chest pain.   primidone 50 MG tablet Commonly known as:  MYSOLINE Take 100 mg by mouth daily.   sevelamer carbonate 800 MG tablet Commonly known as:  RENVELA Take 2 tablets (1,600 mg total) by mouth 3 (three) times daily with meals.   traMADol 50 MG tablet Commonly known as:  ULTRAM Take 2 tablets (100 mg total) by mouth 2 (two) times daily. What changed:    when to take this  reasons to take this   vitamin B-12 500 MCG tablet Commonly known as:  CYANOCOBALAMIN Take 1,000 mcg by mouth daily.       Discharge Instructions: Please refer to Patient Instructions section of EMR for full details.  Patient was counseled important signs and symptoms that should prompt return to medical care, changes in medications, dietary instructions, activity restrictions, and follow up appointments.   Follow-Up Appointments: Follow-up Information    [facility] Follow up.    Specialty:  Home Health Services Why:  home health services arranged Contact information: [address] [place] [address] [phone]        [name], MD.   Specialty:  Internal Medicine Contact information: [place] [address] [place], [place], DO 07/11/2017, 12:10 PM PGY-1, [facility]

## 2017-07-22 DIAGNOSIS — Z79899 Other long term (current) drug therapy: Secondary | ICD-10-CM | POA: Diagnosis not present

## 2017-07-22 DIAGNOSIS — I120 Hypertensive chronic kidney disease with stage 5 chronic kidney disease or end stage renal disease: Secondary | ICD-10-CM | POA: Diagnosis not present

## 2017-07-22 DIAGNOSIS — N186 End stage renal disease: Secondary | ICD-10-CM | POA: Diagnosis not present

## 2017-07-22 DIAGNOSIS — Z87891 Personal history of nicotine dependence: Secondary | ICD-10-CM | POA: Diagnosis not present

## 2017-07-22 DIAGNOSIS — Z885 Allergy status to narcotic agent status: Secondary | ICD-10-CM | POA: Diagnosis not present

## 2017-07-22 DIAGNOSIS — Z7982 Long term (current) use of aspirin: Secondary | ICD-10-CM | POA: Diagnosis not present

## 2017-07-22 DIAGNOSIS — L0390 Cellulitis, unspecified: Secondary | ICD-10-CM | POA: Diagnosis not present

## 2017-07-22 DIAGNOSIS — I129 Hypertensive chronic kidney disease with stage 1 through stage 4 chronic kidney disease, or unspecified chronic kidney disease: Secondary | ICD-10-CM | POA: Diagnosis not present

## 2017-07-22 DIAGNOSIS — I252 Old myocardial infarction: Secondary | ICD-10-CM | POA: Diagnosis not present

## 2017-07-22 DIAGNOSIS — Z794 Long term (current) use of insulin: Secondary | ICD-10-CM | POA: Diagnosis not present

## 2017-07-22 DIAGNOSIS — Z992 Dependence on renal dialysis: Secondary | ICD-10-CM | POA: Diagnosis not present

## 2017-07-22 DIAGNOSIS — R600 Localized edema: Secondary | ICD-10-CM | POA: Diagnosis not present

## 2017-07-22 DIAGNOSIS — N184 Chronic kidney disease, stage 4 (severe): Secondary | ICD-10-CM | POA: Diagnosis not present

## 2017-07-22 DIAGNOSIS — L03115 Cellulitis of right lower limb: Secondary | ICD-10-CM | POA: Diagnosis not present

## 2017-07-22 DIAGNOSIS — E1122 Type 2 diabetes mellitus with diabetic chronic kidney disease: Secondary | ICD-10-CM | POA: Diagnosis not present

## 2017-07-25 ENCOUNTER — Inpatient Hospital Stay (HOSPITAL_COMMUNITY): Payer: Non-veteran care

## 2017-07-25 ENCOUNTER — Other Ambulatory Visit: Payer: Self-pay

## 2017-07-25 ENCOUNTER — Encounter (HOSPITAL_COMMUNITY): Payer: Self-pay | Admitting: Emergency Medicine

## 2017-07-25 ENCOUNTER — Inpatient Hospital Stay (HOSPITAL_COMMUNITY)
Admission: EM | Admit: 2017-07-25 | Discharge: 2017-08-10 | DRG: 602 | Disposition: A | Discharge status: Skilled Nursing Facility | Payer: Non-veteran care | Attending: Family Medicine | Admitting: Family Medicine

## 2017-07-25 DIAGNOSIS — L97919 Non-pressure chronic ulcer of unspecified part of right lower leg with unspecified severity: Secondary | ICD-10-CM | POA: Diagnosis present

## 2017-07-25 DIAGNOSIS — E1165 Type 2 diabetes mellitus with hyperglycemia: Secondary | ICD-10-CM | POA: Diagnosis not present

## 2017-07-25 DIAGNOSIS — E1151 Type 2 diabetes mellitus with diabetic peripheral angiopathy without gangrene: Secondary | ICD-10-CM | POA: Diagnosis not present

## 2017-07-25 DIAGNOSIS — E871 Hypo-osmolality and hyponatremia: Secondary | ICD-10-CM | POA: Diagnosis present

## 2017-07-25 DIAGNOSIS — Q2733 Arteriovenous malformation of digestive system vessel: Secondary | ICD-10-CM

## 2017-07-25 DIAGNOSIS — L308 Other specified dermatitis: Secondary | ICD-10-CM | POA: Diagnosis present

## 2017-07-25 DIAGNOSIS — I132 Hypertensive heart and chronic kidney disease with heart failure and with stage 5 chronic kidney disease, or end stage renal disease: Secondary | ICD-10-CM | POA: Diagnosis present

## 2017-07-25 DIAGNOSIS — K921 Melena: Secondary | ICD-10-CM | POA: Diagnosis not present

## 2017-07-25 DIAGNOSIS — Z872 Personal history of diseases of the skin and subcutaneous tissue: Secondary | ICD-10-CM | POA: Diagnosis not present

## 2017-07-25 DIAGNOSIS — L97911 Non-pressure chronic ulcer of unspecified part of right lower leg limited to breakdown of skin: Secondary | ICD-10-CM | POA: Diagnosis not present

## 2017-07-25 DIAGNOSIS — I252 Old myocardial infarction: Secondary | ICD-10-CM

## 2017-07-25 DIAGNOSIS — E1122 Type 2 diabetes mellitus with diabetic chronic kidney disease: Secondary | ICD-10-CM | POA: Diagnosis not present

## 2017-07-25 DIAGNOSIS — L03116 Cellulitis of left lower limb: Secondary | ICD-10-CM | POA: Diagnosis present

## 2017-07-25 DIAGNOSIS — K648 Other hemorrhoids: Secondary | ICD-10-CM | POA: Diagnosis present

## 2017-07-25 DIAGNOSIS — D649 Anemia, unspecified: Secondary | ICD-10-CM | POA: Diagnosis not present

## 2017-07-25 DIAGNOSIS — L989 Disorder of the skin and subcutaneous tissue, unspecified: Secondary | ICD-10-CM

## 2017-07-25 DIAGNOSIS — K644 Residual hemorrhoidal skin tags: Secondary | ICD-10-CM | POA: Diagnosis present

## 2017-07-25 DIAGNOSIS — E8889 Other specified metabolic disorders: Secondary | ICD-10-CM | POA: Diagnosis present

## 2017-07-25 DIAGNOSIS — K625 Hemorrhage of anus and rectum: Secondary | ICD-10-CM | POA: Diagnosis not present

## 2017-07-25 DIAGNOSIS — I872 Venous insufficiency (chronic) (peripheral): Secondary | ICD-10-CM | POA: Diagnosis not present

## 2017-07-25 DIAGNOSIS — Z8673 Personal history of transient ischemic attack (TIA), and cerebral infarction without residual deficits: Secondary | ICD-10-CM | POA: Diagnosis not present

## 2017-07-25 DIAGNOSIS — K5730 Diverticulosis of large intestine without perforation or abscess without bleeding: Secondary | ICD-10-CM | POA: Diagnosis not present

## 2017-07-25 DIAGNOSIS — M25561 Pain in right knee: Secondary | ICD-10-CM

## 2017-07-25 DIAGNOSIS — Z888 Allergy status to other drugs, medicaments and biological substances status: Secondary | ICD-10-CM

## 2017-07-25 DIAGNOSIS — K264 Chronic or unspecified duodenal ulcer with hemorrhage: Secondary | ICD-10-CM | POA: Diagnosis not present

## 2017-07-25 DIAGNOSIS — Z955 Presence of coronary angioplasty implant and graft: Secondary | ICD-10-CM

## 2017-07-25 DIAGNOSIS — D500 Iron deficiency anemia secondary to blood loss (chronic): Secondary | ICD-10-CM | POA: Diagnosis not present

## 2017-07-25 DIAGNOSIS — L03115 Cellulitis of right lower limb: Principal | ICD-10-CM

## 2017-07-25 DIAGNOSIS — E860 Dehydration: Secondary | ICD-10-CM | POA: Diagnosis not present

## 2017-07-25 DIAGNOSIS — D631 Anemia in chronic kidney disease: Secondary | ICD-10-CM | POA: Diagnosis not present

## 2017-07-25 DIAGNOSIS — N186 End stage renal disease: Secondary | ICD-10-CM | POA: Diagnosis present

## 2017-07-25 DIAGNOSIS — I5042 Chronic combined systolic (congestive) and diastolic (congestive) heart failure: Secondary | ICD-10-CM | POA: Diagnosis present

## 2017-07-25 DIAGNOSIS — Z992 Dependence on renal dialysis: Secondary | ICD-10-CM

## 2017-07-25 DIAGNOSIS — G8929 Other chronic pain: Secondary | ICD-10-CM | POA: Diagnosis not present

## 2017-07-25 DIAGNOSIS — R195 Other fecal abnormalities: Secondary | ICD-10-CM | POA: Diagnosis not present

## 2017-07-25 DIAGNOSIS — Z885 Allergy status to narcotic agent status: Secondary | ICD-10-CM

## 2017-07-25 DIAGNOSIS — I96 Gangrene, not elsewhere classified: Secondary | ICD-10-CM | POA: Diagnosis not present

## 2017-07-25 DIAGNOSIS — Z1211 Encounter for screening for malignant neoplasm of colon: Secondary | ICD-10-CM | POA: Diagnosis not present

## 2017-07-25 DIAGNOSIS — Z7902 Long term (current) use of antithrombotics/antiplatelets: Secondary | ICD-10-CM

## 2017-07-25 DIAGNOSIS — I120 Hypertensive chronic kidney disease with stage 5 chronic kidney disease or end stage renal disease: Secondary | ICD-10-CM | POA: Diagnosis not present

## 2017-07-25 DIAGNOSIS — R279 Unspecified lack of coordination: Secondary | ICD-10-CM | POA: Diagnosis not present

## 2017-07-25 DIAGNOSIS — I255 Ischemic cardiomyopathy: Secondary | ICD-10-CM | POA: Diagnosis not present

## 2017-07-25 DIAGNOSIS — K3189 Other diseases of stomach and duodenum: Secondary | ICD-10-CM | POA: Diagnosis not present

## 2017-07-25 DIAGNOSIS — N2581 Secondary hyperparathyroidism of renal origin: Secondary | ICD-10-CM | POA: Diagnosis not present

## 2017-07-25 DIAGNOSIS — D62 Acute posthemorrhagic anemia: Secondary | ICD-10-CM | POA: Diagnosis not present

## 2017-07-25 DIAGNOSIS — Z743 Need for continuous supervision: Secondary | ICD-10-CM | POA: Diagnosis not present

## 2017-07-25 DIAGNOSIS — E785 Hyperlipidemia, unspecified: Secondary | ICD-10-CM | POA: Diagnosis not present

## 2017-07-25 DIAGNOSIS — I739 Peripheral vascular disease, unspecified: Secondary | ICD-10-CM | POA: Diagnosis not present

## 2017-07-25 DIAGNOSIS — R059 Cough, unspecified: Secondary | ICD-10-CM

## 2017-07-25 DIAGNOSIS — I2510 Atherosclerotic heart disease of native coronary artery without angina pectoris: Secondary | ICD-10-CM | POA: Diagnosis present

## 2017-07-25 DIAGNOSIS — Z79899 Other long term (current) drug therapy: Secondary | ICD-10-CM

## 2017-07-25 DIAGNOSIS — L309 Dermatitis, unspecified: Secondary | ICD-10-CM

## 2017-07-25 DIAGNOSIS — K449 Diaphragmatic hernia without obstruction or gangrene: Secondary | ICD-10-CM | POA: Diagnosis not present

## 2017-07-25 DIAGNOSIS — K269 Duodenal ulcer, unspecified as acute or chronic, without hemorrhage or perforation: Secondary | ICD-10-CM | POA: Diagnosis not present

## 2017-07-25 DIAGNOSIS — K922 Gastrointestinal hemorrhage, unspecified: Secondary | ICD-10-CM | POA: Diagnosis not present

## 2017-07-25 DIAGNOSIS — M1711 Unilateral primary osteoarthritis, right knee: Secondary | ICD-10-CM | POA: Diagnosis present

## 2017-07-25 DIAGNOSIS — L0390 Cellulitis, unspecified: Secondary | ICD-10-CM | POA: Diagnosis not present

## 2017-07-25 DIAGNOSIS — Z794 Long term (current) use of insulin: Secondary | ICD-10-CM

## 2017-07-25 DIAGNOSIS — R05 Cough: Secondary | ICD-10-CM

## 2017-07-25 DIAGNOSIS — Z7982 Long term (current) use of aspirin: Secondary | ICD-10-CM

## 2017-07-25 DIAGNOSIS — Z8349 Family history of other endocrine, nutritional and metabolic diseases: Secondary | ICD-10-CM

## 2017-07-25 HISTORY — DX: Cellulitis, unspecified: L03.90

## 2017-07-25 HISTORY — DX: Cellulitis of right lower limb: L03.115

## 2017-07-25 LAB — GLUCOSE, CAPILLARY
Glucose-Capillary: 130 mg/dL — ABNORMAL HIGH (ref 70–99)
Glucose-Capillary: 185 mg/dL — ABNORMAL HIGH (ref 70–99)

## 2017-07-25 LAB — COMPREHENSIVE METABOLIC PANEL
ALK PHOS: 76 U/L (ref 38–126)
ALT: 22 U/L (ref 0–44)
ANION GAP: 19 — AB (ref 5–15)
AST: 21 U/L (ref 15–41)
Albumin: 3 g/dL — ABNORMAL LOW (ref 3.5–5.0)
BUN: 83 mg/dL — AB (ref 8–23)
CHLORIDE: 92 mmol/L — AB (ref 98–111)
CO2: 20 mmol/L — ABNORMAL LOW (ref 22–32)
Calcium: 9.2 mg/dL (ref 8.9–10.3)
Creatinine, Ser: 6.15 mg/dL — ABNORMAL HIGH (ref 0.61–1.24)
GFR calc Af Amer: 9 mL/min — ABNORMAL LOW (ref >60)
GFR calc non Af Amer: 8 mL/min — ABNORMAL LOW (ref >60)
GLUCOSE: 145 mg/dL — AB (ref 70–99)
POTASSIUM: 4.5 mmol/L (ref 3.5–5.1)
SODIUM: 131 mmol/L — AB (ref 135–145)
TOTAL PROTEIN: 6.7 g/dL (ref 6.5–8.1)
Total Bilirubin: 0.5 mg/dL (ref 0.3–1.2)

## 2017-07-25 LAB — CBC WITH DIFFERENTIAL/PLATELET
ABS IMMATURE GRANULOCYTES: 0.1 10*3/uL (ref 0.0–0.1)
BASOS ABS: 0 10*3/uL (ref 0.0–0.1)
Basophils Relative: 0 %
EOS PCT: 0 %
Eosinophils Absolute: 0 10*3/uL (ref 0.0–0.7)
HEMATOCRIT: 30.2 % — AB (ref 39.0–52.0)
Hemoglobin: 10.3 g/dL — ABNORMAL LOW (ref 13.0–17.0)
Immature Granulocytes: 1 %
LYMPHS ABS: 0.8 10*3/uL (ref 0.7–4.0)
Lymphocytes Relative: 4 %
MCH: 32.3 pg (ref 26.0–34.0)
MCHC: 34.1 g/dL (ref 30.0–36.0)
MCV: 94.7 fL (ref 78.0–100.0)
MONO ABS: 1.6 10*3/uL — AB (ref 0.1–1.0)
Monocytes Relative: 9 %
NEUTROS ABS: 15.4 10*3/uL — AB (ref 1.7–7.7)
Neutrophils Relative %: 86 %
Platelets: 300 10*3/uL (ref 150–400)
RBC: 3.19 MIL/uL — AB (ref 4.22–5.81)
RDW: 15.5 % (ref 11.5–15.5)
WBC: 18 10*3/uL — ABNORMAL HIGH (ref 4.0–10.5)

## 2017-07-25 LAB — I-STAT CG4 LACTIC ACID, ED: Lactic Acid, Venous: 1.18 mmol/L (ref 0.5–1.9)

## 2017-07-25 LAB — VANCOMYCIN, RANDOM: VANCOMYCIN RM: 19

## 2017-07-25 MED ORDER — SEVELAMER CARBONATE 800 MG PO TABS
1600.0000 mg | ORAL_TABLET | Freq: Three times a day (TID) | ORAL | Status: DC
  Administered 2017-07-25 – 2017-07-26 (×2): 1600 mg via ORAL
  Filled 2017-07-25 (×2): qty 2

## 2017-07-25 MED ORDER — PRIMIDONE 50 MG PO TABS
100.0000 mg | ORAL_TABLET | Freq: Every day | ORAL | Status: DC
  Administered 2017-07-26 – 2017-08-10 (×16): 100 mg via ORAL
  Filled 2017-07-25 (×16): qty 2

## 2017-07-25 MED ORDER — TRAMADOL HCL 50 MG PO TABS
100.0000 mg | ORAL_TABLET | Freq: Four times a day (QID) | ORAL | Status: DC | PRN
  Administered 2017-07-25 – 2017-08-06 (×26): 100 mg via ORAL
  Filled 2017-07-25 (×27): qty 2

## 2017-07-25 MED ORDER — RENA-VITE PO TABS
1.0000 | ORAL_TABLET | Freq: Every day | ORAL | Status: DC
  Administered 2017-07-25 – 2017-08-09 (×15): 1 via ORAL
  Filled 2017-07-25 (×15): qty 1

## 2017-07-25 MED ORDER — CYANOCOBALAMIN 500 MCG PO TABS
1000.0000 ug | ORAL_TABLET | Freq: Every day | ORAL | Status: DC
  Administered 2017-07-26 – 2017-08-10 (×15): 1000 ug via ORAL
  Filled 2017-07-25 (×16): qty 2

## 2017-07-25 MED ORDER — GABAPENTIN 300 MG PO CAPS
300.0000 mg | ORAL_CAPSULE | Freq: Every day | ORAL | Status: DC
  Administered 2017-07-25 – 2017-08-09 (×15): 300 mg via ORAL
  Filled 2017-07-25 (×15): qty 1

## 2017-07-25 MED ORDER — DSS 100 MG PO CAPS: 100.0000 mg | ORAL_CAPSULE | Freq: Two times a day (BID) | ORAL | Status: DC

## 2017-07-25 MED ORDER — ACETAMINOPHEN 325 MG PO TABS
650.0000 mg | ORAL_TABLET | ORAL | Status: DC | PRN
  Administered 2017-07-25 – 2017-07-26 (×3): 650 mg via ORAL
  Filled 2017-07-25 (×3): qty 2

## 2017-07-25 MED ORDER — VITAMIN D 1000 UNITS PO TABS
1000.0000 [IU] | ORAL_TABLET | Freq: Every day | ORAL | Status: DC
  Administered 2017-07-26: 1000 [IU] via ORAL
  Filled 2017-07-25: qty 1

## 2017-07-25 MED ORDER — SODIUM CHLORIDE 0.9 % IV SOLN
2000.0000 mg | Freq: Once | INTRAVENOUS | Status: DC
  Filled 2017-07-25: qty 2000

## 2017-07-25 MED ORDER — CALCITRIOL 0.5 MCG PO CAPS
0.5000 ug | ORAL_CAPSULE | Freq: Every day | ORAL | Status: DC
  Administered 2017-07-26: 0.5 ug via ORAL
  Filled 2017-07-25: qty 1

## 2017-07-25 MED ORDER — HEPARIN SODIUM (PORCINE) 5000 UNIT/ML IJ SOLN
5000.0000 [IU] | Freq: Three times a day (TID) | INTRAMUSCULAR | Status: DC
  Administered 2017-08-03: 5000 [IU] via SUBCUTANEOUS
  Filled 2017-07-25 (×11): qty 1

## 2017-07-25 MED ORDER — ALPRAZOLAM 0.5 MG PO TABS
0.5000 mg | ORAL_TABLET | Freq: Every evening | ORAL | Status: DC | PRN
Start: 2017-07-25 — End: 2017-08-10
  Administered 2017-07-28 – 2017-08-06 (×9): 0.5 mg via ORAL
  Filled 2017-07-25 (×9): qty 1

## 2017-07-25 MED ORDER — ATORVASTATIN CALCIUM 80 MG PO TABS
80.0000 mg | ORAL_TABLET | Freq: Every day | ORAL | Status: DC
  Administered 2017-07-25 – 2017-08-09 (×15): 80 mg via ORAL
  Filled 2017-07-25 (×15): qty 1

## 2017-07-25 MED ORDER — VANCOMYCIN HCL IN DEXTROSE 1-5 GM/200ML-% IV SOLN: 1000.0000 mg | Freq: Once | INTRAVENOUS | Status: DC

## 2017-07-25 MED ORDER — CHLORHEXIDINE GLUCONATE CLOTH 2 % EX PADS
6.0000 | MEDICATED_PAD | Freq: Every day | CUTANEOUS | Status: DC
  Administered 2017-07-26 – 2017-08-06 (×10): 6 via TOPICAL

## 2017-07-25 MED ORDER — SODIUM CHLORIDE 0.9 % IV BOLUS
500.0000 mL | Freq: Once | INTRAVENOUS | Status: AC
Start: 2017-07-25 — End: 2017-07-25
  Administered 2017-07-25: 500 mL via INTRAVENOUS

## 2017-07-25 MED ORDER — VANCOMYCIN HCL IN DEXTROSE 1-5 GM/200ML-% IV SOLN
1000.0000 mg | INTRAVENOUS | Status: DC
  Administered 2017-07-28: 1000 mg via INTRAVENOUS
  Filled 2017-07-25 (×2): qty 200

## 2017-07-25 MED ORDER — NITROGLYCERIN 0.4 MG SL SUBL: 0.4000 mg | SUBLINGUAL_TABLET | SUBLINGUAL | Status: DC | PRN

## 2017-07-25 MED ORDER — ASPIRIN EC 81 MG PO TBEC
81.0000 mg | DELAYED_RELEASE_TABLET | Freq: Every day | ORAL | Status: DC
  Administered 2017-07-27 – 2017-08-03 (×8): 81 mg via ORAL
  Filled 2017-07-25 (×8): qty 1

## 2017-07-25 MED ORDER — INSULIN ASPART 100 UNIT/ML ~~LOC~~ SOLN
0.0000 [IU] | Freq: Three times a day (TID) | SUBCUTANEOUS | Status: DC
  Administered 2017-07-25 – 2017-07-26 (×2): 2 [IU] via SUBCUTANEOUS
  Administered 2017-07-26: 3 [IU] via SUBCUTANEOUS
  Administered 2017-07-27 (×2): 5 [IU] via SUBCUTANEOUS
  Administered 2017-07-27 – 2017-07-28 (×2): 3 [IU] via SUBCUTANEOUS
  Administered 2017-07-28 – 2017-07-29 (×3): 2 [IU] via SUBCUTANEOUS
  Administered 2017-07-29: 3 [IU] via SUBCUTANEOUS
  Administered 2017-07-30: 2 [IU] via SUBCUTANEOUS
  Administered 2017-07-30: 5 [IU] via SUBCUTANEOUS
  Administered 2017-07-31: 8 [IU] via SUBCUTANEOUS
  Administered 2017-07-31: 5 [IU] via SUBCUTANEOUS
  Administered 2017-08-01: 3 [IU] via SUBCUTANEOUS
  Administered 2017-08-01: 5 [IU] via SUBCUTANEOUS
  Administered 2017-08-01: 2 [IU] via SUBCUTANEOUS
  Administered 2017-08-02 – 2017-08-03 (×2): 3 [IU] via SUBCUTANEOUS
  Administered 2017-08-03: 5 [IU] via SUBCUTANEOUS
  Administered 2017-08-03 – 2017-08-04 (×3): 3 [IU] via SUBCUTANEOUS
  Administered 2017-08-05: 5 [IU] via SUBCUTANEOUS
  Administered 2017-08-05 – 2017-08-06 (×3): 3 [IU] via SUBCUTANEOUS
  Administered 2017-08-07 (×2): 2 [IU] via SUBCUTANEOUS
  Administered 2017-08-07: 3 [IU] via SUBCUTANEOUS
  Administered 2017-08-08: 2 [IU] via SUBCUTANEOUS
  Administered 2017-08-08 – 2017-08-10 (×3): 3 [IU] via SUBCUTANEOUS

## 2017-07-25 MED ORDER — ISOSORB DINITRATE-HYDRALAZINE 20-37.5 MG PO TABS
1.0000 | ORAL_TABLET | Freq: Two times a day (BID) | ORAL | Status: DC
  Administered 2017-07-25 – 2017-08-10 (×26): 1 via ORAL
  Filled 2017-07-25 (×31): qty 1

## 2017-07-25 MED ORDER — CARVEDILOL 3.125 MG PO TABS
3.1250 mg | ORAL_TABLET | Freq: Two times a day (BID) | ORAL | Status: DC
  Administered 2017-07-25 – 2017-08-01 (×10): 3.125 mg via ORAL
  Filled 2017-07-25 (×11): qty 1

## 2017-07-25 MED ORDER — CLOPIDOGREL BISULFATE 75 MG PO TABS
75.0000 mg | ORAL_TABLET | Freq: Every day | ORAL | Status: DC
  Administered 2017-07-26 – 2017-08-02 (×8): 75 mg via ORAL
  Filled 2017-07-25 (×8): qty 1

## 2017-07-25 MED ORDER — INSULIN ASPART 100 UNIT/ML ~~LOC~~ SOLN
0.0000 [IU] | Freq: Every day | SUBCUTANEOUS | Status: DC
  Administered 2017-07-26 – 2017-08-09 (×4): 2 [IU] via SUBCUTANEOUS

## 2017-07-25 NOTE — Progress Notes (Signed)
2220 pt refused Heparin. Had previously and stated woke up with blood all in the bed from continued bleeding. Educated and offered a gauze dressing over injection site, pt was adamant he was not taking the injection.

## 2017-07-25 NOTE — ED Notes (Signed)
Admitting at bedside. Will transport patient when they are finished

## 2017-07-25 NOTE — Progress Notes (Signed)
Pharmacy Antibiotic Note  Mitchell Garcia is a 72 y.o. male admitted on 07/25/2017 with cellulitis.  Pharmacy has been consulted for vancomycin dosing. Pt is afebrile and WBC is elevated at 18. Pt with history of ESRD on HD. He has been on vancomycin x 3 weeks with last dose on Friday. Last HD was on Saturday. A random vancomycin level today is therapeutic at 19.   Plan: No further vancomycin needed today Vancomycin 1gm QHS starting tomorrow F/u renal plans, C&S, clinical status and vanc levels PRN     Temp (24hrs), Avg:98.6 F (37 C), Min:98.6 F (37 C), Max:98.6 F (37 C)  Recent Labs  Lab 07/25/17 1050 07/25/17 1112  WBC 18.0*  --   CREATININE 6.15*  --   LATICACIDVEN  --  1.18    CrCl cannot be calculated (Unknown ideal weight.).    Allergies  Allergen Reactions  . Oxycodone Other (See Comments)    DIFFICULT TO WAKE UP  . Tramadol Nausea And Vomiting  . Lisinopril     Hyperkalemia   . Losartan     Hyperkalemia     Antimicrobials this admission: Vanc PTA>>  Dose adjustments this admission: 7/1 VR = 19  Microbiology results: Pending  Thank you for allowing pharmacy to be a part of this patient's care.  Mitchell Garcia, Rande Lawman 07/25/2017 1:26 PM

## 2017-07-25 NOTE — H&P (Addendum)
[facility] Admission History and Physical Service Pager: [phone]  Patient name: Mitchell Garcia Medical record number: [id number] Date of birth: Jun 11, 1945 Age: 72 y.o. Gender: male  Primary Care Provider: [name], MD Consultants: none Code Status: Full Code  Chief Complaint: leg wound  Assessment and Plan: Mitchell Garcia is a 72 y.o. male presenting with cellulitis of leg . PMH is significant for ESRD on HD, T2DM, anemia, combined systolic/diastolic HF, CAD with history of NSTEMI, chronic leg wound  Severe Purulent Cellulitis of Left Leg - Acute.  Leg wound present for 6 weeks with increasing redness, pain, and knee swelling. Notes inability to walk since Thursday.  Has received 8 doses of IV Vanc following HD per wife's report.  Last dose on Friday evening at OSH.  Currently afebrile, but wife reports Tmax 101.4 on Saturday.  Leukocytosis to 18 which is elevated from previous hospitalization 6/16.  Will treat as severe purulent cellulitis. Patient meeting sepsis criteria with leukocytosis and AMS, however he is ESRD and has already received 500 cc bolus NS in the ED. QSOFA score 1 which is reassuring. Vitals stable, no hypotension or tachycardia. - Admit to inpatient attending Dr. Erin Hearing. - Vitals per floor. Pulse Ox with vitals. - qSOFA score of 1.  Not high risk. - Will not fluid bolus due to low risk qSOFA and ESRD and CHF, can bolus if vital signs change - Start IV Vancomycin per pharmacy consult - obtain ABIs - Consider imaging to rule out osteomyelitis and joint involvement - Monitor vitals - Pending Blood cultures - Pain control with Tramadol 100mg  Q6h PRN  ESRD on HD - TTS schedule.  Last treatment was Saturday and was complete.  Generally compliant with HD. - Nephro consult for HD - Avoid nephrotoxic agents. - Follow electrolytes daily - Strict I/O's - continue home renal meds  Poorly controlled T2DM - Chronic.  Last A1c 7.6  in 09/2016.  Insulin regimen in computer may be inaccurate as he has multiple fast acting and long acting insulins listed, will confirm with patient in AM  - Start on mSSI with evening coverage - can add long acting insulin as needed  - Diabetic/renal diet  Anemia of chronic disease - Stable.  Hgb 10.3 at admission which is higher than baseline of 8 - continue to monitor for signs of blood loss  Combined HF - Chronic.  Currently asymptomatic aside from bilateral LE edema.  Most recent Echo 07/09/17 40-45%. - Strict I/O's - Continue to monitor. - continue home HF medications  CAD with history of NSTEMI - Chronic. S/P Stent x2 per record.Left Heart Cath 9/14 significant for multivessel disease with proximal LAD stenosis of 70%, total occlusion of mid LCx, and 95% of RCA stenosis.  On statin, Nitro, Coreg. - Continue ASA and Plavix - continue home meds   FEN/GI: renal/carb modified diet Prophylaxis: on plavix, heparin subq  Disposition: pending clinical improvement  History of Present Illness:  Mitchell Garcia is a 72 y.o. male presenting with leg wound.  Has had wound leg for 6 weeks. Has not been able to walk since Thursday of last week (5 days ago) because knee was swollen and leg hurts. Redness has been coming and going. Since the antibiotic "wore off" the leg has been very red. Last dose Friday evening with HD. He has had 8 doses total w/ HD. He goes to HD at the [facility] in [place] Tuesday Thursday and Saturday. The pain is severe and keeping  him awake. He is taking 50 mg tramadol q4 hours at home with some relief. Wife is primary caretaker at home.   Wound started as a "brown spot on back of left leg like a big freckle". It started getting darker in color and turned black. He was getting [facility]yl at the [facility] for wound care. A nurse would come to the house to change dressings. The wound then opened. He was started on Vanc.  Review Of Systems: Per HPI with the following additions:  Review  of Systems  Constitutional: Positive for chills and fever.  HENT: Negative for congestion.   Eyes: Positive for blurred vision. Negative for pain.  Respiratory: Negative for cough and shortness of breath.   Cardiovascular: Positive for leg swelling. Negative for chest pain.  Gastrointestinal: Positive for nausea. Negative for blood in stool, constipation, diarrhea, melena and vomiting.  Genitourinary: Positive for dysuria and urgency.  Musculoskeletal: Negative for falls.  Skin: Negative for itching and rash.  Neurological: Negative for speech change, seizures and loss of consciousness.    Patient Active Problem List   Diagnosis Date Noted  . Cellulitis 07/25/2017  . Syncope 07/07/2017  . Pressure injury of skin 07/07/2017  . ESRD on dialysis ([place]) 05/24/2017  . Acute blood loss anemia 05/18/2017  . Acute on chronic combined systolic and diastolic CHF (congestive heart failure) ([place]) 05/17/2017  . MI (myocardial infarction) ([place])   . Ischemic cardiomyopathy 10/15/2016  . CAD S/P percutaneous coronary angioplasty 10/15/2016  . Acute kidney injury superimposed on chronic kidney disease ([place]) 10/15/2016  . Uncontrolled diabetes mellitus ([place]) 10/15/2016  . Essential hypertension 10/15/2016  . Acute combined systolic (congestive) and diastolic (congestive) heart failure ([place])   . NSTEMI (non-ST elevated myocardial infarction) ([place]) 10/04/2016  . Acute on chronic diastolic heart failure ([place]) 01/27/2013  . Humerus head fracture 01/27/2013  . Altered mental state 01/25/2013  . Acute renal failure superimposed on chronic kidney disease ([place]) 01/25/2013  . Transaminitis 01/25/2013  . Leukocytosis 01/25/2013  . Anemia of chronic disease 01/25/2013  . Hyponatremia 01/25/2013  . Hyperglycemia 01/25/2013    Past Medical History: Past Medical History:  Diagnosis Date  . Acute kidney injury superimposed on chronic kidney disease ([place]) 10/15/2016  . Altered mental state 01/25/2013  .  Anemia of chronic disease 01/25/2013  . CAD S/P percutaneous coronary angioplasty    a.not to be a candidate for CABG. On 9/19, went back to cath lab for PCI s/p orbital atherectomy with DES to ostial RCA, and PTCA/DES to distal RCA. Residual 50% prox LAD, 70% D1, CTO mLCx noted by initial diagnostic cath 10/08/16 to be treated medically.  . Cancer ([place])   . Chronic combined systolic and diastolic CHF (congestive heart failure) ([place])   . CKD (chronic kidney disease), stage IV ([place])   . Diabetes mellitus with renal complications ([facility])    uncontrolled  . Essential hypertension 10/15/2016  . Humerus head fracture 01/27/2013  . Hyperlipidemia   . Hyponatremia 01/25/2013  . Insulin dependent diabetes mellitus ([place])   . Ischemic cardiomyopathy 10/15/2016  . MI (myocardial infarction) ([place])    years ago  . Stroke ([place])   . Transaminitis 01/25/2013  . Uncontrolled diabetes mellitus ([place]) 10/15/2016    Past Surgical History: Past Surgical History:  Procedure Laterality Date  . CORONARY STENT INTERVENTION N/A 10/13/2016   Procedure: CORONARY STENT INTERVENTION;  Surgeon: Burnell Blanks, MD;  Location: [facility];  Service: Cardiovascular;  Laterality: N/A;  . IR FLUORO GUIDE  CV LINE RIGHT  05/19/2017  . IR US GUIDE VASC ACCESS RIGHT  05/19/2017  . LEFT HEART CATH AND CORONARY ANGIOGRAPHY N/A 10/08/2016   Procedure: LEFT HEART CATH AND CORONARY ANGIOGRAPHY;  Surgeon: Nelva Bush, MD;  Location: [facility];  Service: Cardiovascular;  Laterality: N/A;  . SKIN CANCER EXCISION    . TEMPORARY PACEMAKER N/A 10/13/2016   Procedure: Temporary Pacemaker;  Surgeon: Burnell Blanks, MD;  Location: [facility];  Service: Cardiovascular;  Laterality: N/A;    Social History: Social History   Tobacco Use  . Smoking status: Never Smoker  . Smokeless tobacco: Never Used  Substance Use Topics  . Alcohol use: No  . Drug use: No   Additional social history: lives with wife,  PCP is the [facility] Please also refer to relevant sections of EMR.  Family History: Family History  Problem Relation Age of Onset  . Hyperlipidemia Father    Allergies and Medications: Allergies  Allergen Reactions  . Oxycodone Other (See Comments)    DIFFICULT TO WAKE UP  . Tramadol Nausea And Vomiting  . Lisinopril     Hyperkalemia   . Losartan     Hyperkalemia    No current facility-administered medications on file prior to encounter.    Current Outpatient Medications on File Prior to Encounter  Medication Sig Dispense Refill  . acetaminophen (TYLENOL) 325 MG tablet Take 2 tablets (650 mg total) by mouth every 4 (four) hours as needed for mild pain, fever or headache. 10 tablet 1  . ALPRAZolam (XANAX) 0.5 MG tablet Take 1 tablet (0.5 mg total) by mouth at bedtime as needed for anxiety. 30 tablet 0  . aspirin EC 81 MG tablet Take 81 mg by mouth daily.    [name] atorvastatin (LIPITOR) 80 MG tablet Take 80 mg by mouth at bedtime.    . benzonatate (TESSALON PERLES) 100 MG capsule Take 1 capsule (100 mg total) by mouth 3 (three) times daily as needed for cough. 30 capsule 0  . calcitRIOL (ROCALTROL) 0.5 MCG capsule Take 1 capsule (0.5 mcg total) by mouth daily. 30 capsule 1  . carvedilol (COREG) 3.125 MG tablet Take 1 tablet (3.125 mg total) by mouth 2 (two) times daily with a meal. 60 tablet 3  . cholecalciferol (VITAMIN D) 1000 units tablet Take 1,000 Units by mouth daily.    . clopidogrel (PLAVIX) 75 MG tablet Take 1 tablet (75 mg total) by mouth daily. 90 tablet 3  . docusate sodium 100 MG CAPS Take 100 mg by mouth 2 (two) times daily. 10 capsule 0  . gabapentin (NEURONTIN) 300 MG capsule Take 1 capsule (300 mg total) by mouth at bedtime. 30 capsule 1  . guaiFENesin (MUCINEX) 600 MG 12 hr tablet Take 1 tablet (600 mg total) by mouth 2 (two) times daily. (Patient not taking: Reported on 07/07/2017) 20 tablet 0  . hydrocortisone (ANUSOL-HC) 25 MG suppository Place 1 suppository (25 mg total)  rectally 2 (two) times daily as needed for hemorrhoids or anal itching. 12 suppository 0  . insulin aspart protamine- aspart (NOVOLOG MIX 70/30) (70-30) 100 UNIT/ML injection Inject 30-40 Units into the skin 2 (two) times daily with a meal.    . insulin detemir (LEVEMIR) 100 UNIT/ML injection Inject 0.18 mLs (18 Units total) into the skin 2 (two) times daily. (Patient not taking: Reported on 07/07/2017) 10 mL 1  . insulin lispro (HUMALOG) 100 UNIT/ML injection Inject subcutaneously as directed according to sliding scale instructions (Patient not  taking: Reported on 07/07/2017) 10 mL 1  . isosorbide-hydrALAZINE (BIDIL) 20-37.5 MG tablet Take 1 tablet by mouth 2 (two) times daily. 60 tablet 1  . multivitamin (RENA-VIT) TABS tablet Take 1 tablet by mouth at bedtime. 30 tablet 1  . nitroGLYCERIN (NITROSTAT) 0.4 MG SL tablet Place 1 tablet (0.4 mg total) under the tongue every 5 (five) minutes x 3 doses as needed for chest pain. 25 tablet 2  . primidone (MYSOLINE) 50 MG tablet Take 100 mg by mouth daily.    . sevelamer carbonate (RENVELA) 800 MG tablet Take 2 tablets (1,600 mg total) by mouth 3 (three) times daily with meals. 180 tablet 0  . traMADol (ULTRAM) 50 MG tablet Take 2 tablets (100 mg total) by mouth 2 (two) times daily. (Patient taking differently: Take 100 mg by mouth every 6 (six) hours as needed. ) 30 tablet 1  . vitamin B-12 (CYANOCOBALAMIN) 500 MCG tablet Take 1,000 mcg by mouth daily.      Objective: BP 134/74 (BP Location: Left Arm)   Pulse 65   Temp 98.8 F (37.1 C) (Oral)   Resp 19   Ht 5\' 11"  (1.803 m)   Wt 98.6 kg (217 lb 6 oz)   SpO2 96%   BMI 30.32 kg/m  Exam: General: 72 yo male, pleasant, cooperative, mildly confused in NAD HEENT: NCAT, PERRLA. Dry lips but MMM Neck: Supple, no LAD, no thryomegaly Cardiovascular: RRR no m/r/g. +2 radial pulses bilaterally Respiratory: CTAB, normal work of breathing, no wheezes, rhonchi, crackles Gastrointestinal: Mildly distended,  +BS, non-tender to palpation MSK: 1+ pitting edema BLE, Faint Dorsalis Pedis Pulse BLE.  Has limited ROM of RLE. No obvious left knee joint effusion, no redness of left knee joint or increased warmth however does appear swollen compared to right Derm: Chronic venous stasis changes BLE,  Redness and increased warmth of RLE stopping below knee, No tenderness to palpation of Right Knee joint.  See photos below. Bandaged wounds on bilateral knees from recent fall 2 weeks ago.       Neuro: No focal deficits Psych: Seems confused, repetitive thought process.  Wife notes increased confusion since Saturday.  Labs and Imaging: CBC BMET  Recent Labs  Lab 07/25/17 1050  WBC 18.0*  HGB 10.3*  HCT 30.2*  PLT 300   Recent Labs  Lab 07/25/17 1050  NA 131*  K 4.5  CL 92*  CO2 20*  BUN 83*  CREATININE 6.15*  GLUCOSE 145*  CALCIUM 9.2      Rittberger, Bernita Raisin, DO 07/25/2017, 3:36 PM PGY-1, [facility] Intern pager: [phone], text pages welcome   FPTS Upper-Level Resident Addendum  I have independently interviewed and examined the patient. I have discussed the above with the original author and agree with their documentation. My edits for correction/addition/clarification are in pink.Please see also any attending notes.   Lucila Maine, DO PGY-2, [facility] Service pager: [phone] (text pages welcome through [facility])

## 2017-07-25 NOTE — ED Provider Notes (Addendum)
[facility] EMERGENCY DEPARTMENT Provider Note   CSN: [id number] Arrival date & time: 07/25/17  1016     History   Chief Complaint Chief Complaint  Patient presents with  . Leg Pain    HPI Mitchell Garcia is a 72 y.o. male presenting for evaluation of right leg pain and swelling.  Patient states he is been having issues with his right leg for the past 6 to 7 weeks.  He has been seen multiple times intermittently for cellulitis.  Recently, his leg has become more painful, making it impossible for him to walk.  He has been getting 30 minutes of vancomycin infusion after dialysis for the past several weeks, but was told that this will no longer happen.  He was seen in [place] several days ago, and admission was recommended, but he was not admitted at that time.  He reports fevers at home, up to 101.4, 99 yesterday.  He has a history of diabetes, ESRD on dialysis (t/th/sa), CVA, HTN, CHF, CAD.  He is on Plavix.  He had a normal ultrasound to r/o DVT several months ago. Started on doxy by wound care a few days ago, has had no improvement in his leg.  Wife states he is more confused today than normal.   HPI  Past Medical History:  Diagnosis Date  . Acute kidney injury superimposed on chronic kidney disease ([place]) 10/15/2016  . Altered mental state 01/25/2013  . Anemia of chronic disease 01/25/2013  . CAD S/P percutaneous coronary angioplasty    a.not to be a candidate for CABG. On 9/19, went back to cath lab for PCI s/p orbital atherectomy with DES to ostial RCA, and PTCA/DES to distal RCA. Residual 50% prox LAD, 70% D1, CTO mLCx noted by initial diagnostic cath 10/08/16 to be treated medically.  . Cancer ([place])   . Chronic combined systolic and diastolic CHF (congestive heart failure) ([place])   . CKD (chronic kidney disease), stage IV ([place])   . Diabetes mellitus with renal complications (H[facility])    uncontrolled  . Essential hypertension 10/15/2016  . Humerus head  fracture 01/27/2013  . Hyperlipidemia   . Hyponatremia 01/25/2013  . Insulin dependent diabetes mellitus ([place])   . Ischemic cardiomyopathy 10/15/2016  . MI (myocardial infarction) ([place])    years ago  . Stroke ([place])   . Transaminitis 01/25/2013  . Uncontrolled diabetes mellitus ([place]) 10/15/2016    Patient Active Problem List   Diagnosis Date Noted  . Cellulitis 07/25/2017  . Syncope 07/07/2017  . Pressure injury of skin 07/07/2017  . ESRD on dialysis ([place]) 05/24/2017  . Acute blood loss anemia 05/18/2017  . Acute on chronic combined systolic and diastolic CHF (congestive heart failure) ([place]) 05/17/2017  . MI (myocardial infarction) ([place])   . Ischemic cardiomyopathy 10/15/2016  . CAD S/P percutaneous coronary angioplasty 10/15/2016  . Acute kidney injury superimposed on chronic kidney disease ([place]) 10/15/2016  . Uncontrolled diabetes mellitus ([place]) 10/15/2016  . Essential hypertension 10/15/2016  . Acute combined systolic (congestive) and diastolic (congestive) heart failure ([place])   . NSTEMI (non-ST elevated myocardial infarction) ([place]) 10/04/2016  . Acute on chronic diastolic heart failure ([place]) 01/27/2013  . Humerus head fracture 01/27/2013  . Altered mental state 01/25/2013  . Acute renal failure superimposed on chronic kidney disease ([place]) 01/25/2013  . Transaminitis 01/25/2013  . Leukocytosis 01/25/2013  . Anemia of chronic disease 01/25/2013  . Hyponatremia 01/25/2013  . Hyperglycemia 01/25/2013    Past Surgical History:  Procedure  Laterality Date  . CORONARY STENT INTERVENTION N/A 10/13/2016   Procedure: CORONARY STENT INTERVENTION;  Surgeon: Burnell Blanks, MD;  Location: [facility];  Service: Cardiovascular;  Laterality: N/A;  . IR FLUORO GUIDE CV LINE RIGHT  05/19/2017  . IR US GUIDE VASC ACCESS RIGHT  05/19/2017  . LEFT HEART CATH AND CORONARY ANGIOGRAPHY N/A 10/08/2016   Procedure: LEFT HEART CATH AND CORONARY ANGIOGRAPHY;  Surgeon: Nelva Bush, MD;   Location: [facility];  Service: Cardiovascular;  Laterality: N/A;  . SKIN CANCER EXCISION    . TEMPORARY PACEMAKER N/A 10/13/2016   Procedure: Temporary Pacemaker;  Surgeon: Burnell Blanks, MD;  Location: [facility];  Service: Cardiovascular;  Laterality: N/A;        Home Medications    Prior to Admission medications   Medication Sig Start Date End Date Taking? Authorizing Provider  acetaminophen (TYLENOL) 325 MG tablet Take 2 tablets (650 mg total) by mouth every 4 (four) hours as needed for mild pain, fever or headache. 05/24/17   Roxan Hockey, MD  ALPRAZolam Duanne Moron) 0.5 MG tablet Take 1 tablet (0.5 mg total) by mouth at bedtime as needed for anxiety. 02/01/13   Theodis Blaze, MD  aspirin EC 81 MG tablet Take 81 mg by mouth daily.    [provider]  atorvastatin (LIPITOR) 80 MG tablet Take 80 mg by mouth at bedtime.    [provider]  benzonatate (TESSALON PERLES) 100 MG capsule Take 1 capsule (100 mg total) by mouth 3 (three) times daily as needed for cough. 05/26/17 05/26/18  Tawny Asal, MD  calcitRIOL (ROCALTROL) 0.5 MCG capsule Take 1 capsule (0.5 mcg total) by mouth daily. 05/24/17   Roxan Hockey, MD  carvedilol (COREG) 3.125 MG tablet Take 1 tablet (3.125 mg total) by mouth 2 (two) times daily with a meal. 05/24/17   Emokpae, Courage, MD  cholecalciferol (VITAMIN D) 1000 units tablet Take 1,000 Units by mouth daily.    [provider]  clopidogrel (PLAVIX) 75 MG tablet Take 1 tablet (75 mg total) by mouth daily. 11/08/16   Isaiah Serge, NP  docusate sodium 100 MG CAPS Take 100 mg by mouth 2 (two) times daily. 02/01/13   Theodis Blaze, MD  gabapentin (NEURONTIN) 300 MG capsule Take 1 capsule (300 mg total) by mouth at bedtime. 05/24/17   Roxan Hockey, MD  guaiFENesin (MUCINEX) 600 MG 12 hr tablet Take 1 tablet (600 mg total) by mouth 2 (two) times daily. Patient not taking: Reported on 07/07/2017 05/24/17   Roxan Hockey,  MD  hydrocortisone (ANUSOL-HC) 25 MG suppository Place 1 suppository (25 mg total) rectally 2 (two) times daily as needed for hemorrhoids or anal itching. 05/24/17   Roxan Hockey, MD  insulin aspart protamine- aspart (NOVOLOG MIX 70/30) (70-30) 100 UNIT/ML injection Inject 30-40 Units into the skin 2 (two) times daily with a meal.    [provider]  insulin detemir (LEVEMIR) 100 UNIT/ML injection Inject 0.18 mLs (18 Units total) into the skin 2 (two) times daily. Patient not taking: Reported on 07/07/2017 05/24/17   Roxan Hockey, MD  insulin lispro (HUMALOG) 100 UNIT/ML injection Inject subcutaneously as directed according to sliding scale instructions Patient not taking: Reported on 07/07/2017 05/24/17 05/24/18  Roxan Hockey, MD  isosorbide-hydrALAZINE (BIDIL) 20-37.5 MG tablet Take 1 tablet by mouth 2 (two) times daily. 05/24/17   Roxan Hockey, MD  multivitamin (RENA-VIT) TABS tablet Take 1 tablet by mouth at bedtime. 05/24/17  Roxan Hockey, MD  nitroGLYCERIN (NITROSTAT) 0.4 MG SL tablet Place 1 tablet (0.4 mg total) under the tongue every 5 (five) minutes x 3 doses as needed for chest pain. 10/18/16   Cheryln Manly, NP  primidone (MYSOLINE) 50 MG tablet Take 100 mg by mouth daily.    [provider]  sevelamer carbonate (RENVELA) 800 MG tablet Take 2 tablets (1,600 mg total) by mouth 3 (three) times daily with meals. 07/10/17   Diallo, Earna Coder, MD  traMADol (ULTRAM) 50 MG tablet Take 2 tablets (100 mg total) by mouth 2 (two) times daily. Patient taking differently: Take 100 mg by mouth every 6 (six) hours as needed.  02/01/13   Theodis Blaze, MD  vitamin B-12 (CYANOCOBALAMIN) 500 MCG tablet Take 1,000 mcg by mouth daily.    [provider]    Family History Family History  Problem Relation Age of Onset  . Hyperlipidemia Father     Social History Social History   Tobacco Use  . Smoking status: Never Smoker  . Smokeless tobacco: Never Used    Substance Use Topics  . Alcohol use: No  . Drug use: No     Allergies   Oxycodone; Tramadol; Lisinopril; and Losartan   Review of Systems Review of Systems  Constitutional: Positive for fever.  Skin: Positive for color change and wound.  Neurological: Positive for weakness.  Hematological: Bruises/bleeds easily.  Psychiatric/Behavioral: Positive for confusion.  All other systems reviewed and are negative.    Physical Exam Updated Vital Signs BP 134/74 (BP Location: Left Arm)   Pulse 65   Temp 98.8 F (37.1 C) (Oral)   Resp 19   Ht 5\' 11"  (1.803 m)   Wt 98.6 kg (217 lb 6 oz)   SpO2 96%   BMI 30.32 kg/m   Physical Exam  Constitutional: He is oriented to person, place, and time. He appears well-developed and well-nourished. No distress.  Elderly male in NAD  HENT:  Head: Normocephalic and atraumatic.  Mouth/Throat: Mucous membranes are dry.  MM dry  Eyes: Pupils are equal, round, and reactive to light. Conjunctivae and EOM are normal.  Neck: Normal range of motion. Neck supple.  Cardiovascular: Normal rate, regular rhythm and intact distal pulses.  Murmur heard. Pulmonary/Chest: Effort normal and breath sounds normal. No respiratory distress. He has no wheezes.  Abdominal: Soft. He exhibits no distension and no mass. There is no tenderness. There is no rebound and no guarding.  Musculoskeletal: Normal range of motion. He exhibits edema and tenderness.  Neurological: He is alert and oriented to person, place, and time.  Skin: Skin is warm and dry. Capillary refill takes less than 2 seconds. There is erythema.  Bilateral pittitng edema, worse on R leg. erythema and warmth of r leg. chronic appearing wound of posterolateral R leg (see pictures).   Psychiatric: He has a normal mood and affect.  Nursing note and vitals reviewed.        ED Treatments / Results  Labs (all labs ordered are listed, but only abnormal results are displayed) Labs Reviewed   COMPREHENSIVE METABOLIC PANEL - Abnormal; Notable for the following components:      Result Value   Sodium 131 (*)    Chloride 92 (*)    CO2 20 (*)    Glucose, Bld 145 (*)    BUN 83 (*)    Creatinine, Ser 6.15 (*)    Albumin 3.0 (*)    GFR calc non Af Amer 8 (*)  GFR calc Af Amer 9 (*)    Anion gap 19 (*)    All other components within normal limits  CBC WITH DIFFERENTIAL/PLATELET - Abnormal; Notable for the following components:   WBC 18.0 (*)    RBC 3.19 (*)    Hemoglobin 10.3 (*)    HCT 30.2 (*)    Neutro Abs 15.4 (*)    Monocytes Absolute 1.6 (*)    All other components within normal limits  CULTURE, BLOOD (ROUTINE X 2)  CULTURE, BLOOD (ROUTINE X 2)  VANCOMYCIN, RANDOM  I-STAT CG4 LACTIC ACID, ED    EKG None  Radiology No results found.  Procedures Procedures (including critical care time)  Medications Ordered in ED Medications  acetaminophen (TYLENOL) tablet 650 mg (has no administration in time range)  ALPRAZolam (XANAX) tablet 0.5 mg (has no administration in time range)  aspirin EC tablet 81 mg (has no administration in time range)  atorvastatin (LIPITOR) tablet 80 mg (has no administration in time range)  calcitRIOL (ROCALTROL) capsule 0.5 mcg (has no administration in time range)  carvedilol (COREG) tablet 3.125 mg (has no administration in time range)  cholecalciferol (VITAMIN D) tablet 1,000 Units (has no administration in time range)  clopidogrel (PLAVIX) tablet 75 mg (has no administration in time range)  DSS CAPS 100 mg (has no administration in time range)  gabapentin (NEURONTIN) capsule 300 mg (has no administration in time range)  isosorbide-hydrALAZINE (BIDIL) 20-37.5 MG per tablet 1 tablet (has no administration in time range)  multivitamin (RENA-VIT) tablet 1 tablet (has no administration in time range)  nitroGLYCERIN (NITROSTAT) SL tablet 0.4 mg (has no administration in time range)  primidone (MYSOLINE) tablet 100 mg (has no administration  in time range)  sevelamer carbonate (RENVELA) tablet 1,600 mg (has no administration in time range)  traMADol (ULTRAM) tablet 100 mg (has no administration in time range)  vitamin B-12 (CYANOCOBALAMIN) tablet 1,000 mcg (has no administration in time range)  heparin injection 5,000 Units (has no administration in time range)  insulin aspart (novoLOG) injection 0-15 Units (has no administration in time range)  insulin aspart (novoLOG) injection 0-5 Units (has no administration in time range)  sodium chloride 0.9 % bolus 500 mL (500 mLs Intravenous New Bag/Given 07/25/17 1242)     Initial Impression / Assessment and Plan / ED Course  I have reviewed the triage vital signs and the nursing notes.  Pertinent labs & imaging results that were available during my care of the patient were reviewed by me and considered in my medical decision making (see chart for details).     Patient presenting for evaluation of right leg pain and swelling.  Physical exam shows chronically appearing wound with erythema, warmth, and swelling of the right leg.  Had a negative DVT study recently, on Plavix.  Low suspicion for DVT.  Has had fevers up to 101.4 at home.  Concern for cellulitis with intermittent antibiotic treatment over the past several weeks.  Patient at high risk for worsening conditions due to ESRD on dialysis, poorly controlled diabetes, and generalized vascular disease.  Labs with a negative lactate, elevated white count to 18.  Vitals stable here, patient afebrile in the ER. CMP showing dehydration. Will obtain blood cultures.  Discussed with Dr. Roderic Palau, who evaluated the patient.  Will consult with family medicine for admission. Will start vanc for infection. gentle fluids for dehydration (cautiously due to kidney disease).  Discussed with family medicine, patient to be admitted.  Final Clinical Impressions(s) / ED  Diagnoses   Final diagnoses:  Cellulitis of right lower extremity  ESRD on dialysis  [facility])  Dehydration    ED Discharge Orders    None       Franchot Heidelberg, PA-C 07/25/17 [place], Daysie Helf, PA-C 07/25/17 2259    Milton Ferguson, MD 07/28/17 1007

## 2017-07-25 NOTE — Progress Notes (Signed)
Received patient from ED. Patient alert and oriented x4. Patient accompanied by wife. Wounds on bilateral knees and right leg cleansed with NS. Foam dressings applied to knees, 4x4 and kerlix applied to right lower leg. Will continue to monitor.

## 2017-07-25 NOTE — ED Triage Notes (Signed)
Pt reports wound to lower right leg that has been progressing since Thursday, seen at [facility] med center on sat and given iv vancomycin, was told that he needed admission for wound treatment but pts wife states that [facility] would not admit him due to dialysis. Does dialysis t, th, s, last session was Saturday. Wife states patient has had fever as high as 101.4 at home. Pt a/ox4, resp e/u, nad.

## 2017-07-25 NOTE — ED Notes (Signed)
Attempted report 

## 2017-07-26 ENCOUNTER — Inpatient Hospital Stay (HOSPITAL_COMMUNITY): Payer: Non-veteran care

## 2017-07-26 DIAGNOSIS — L03115 Cellulitis of right lower limb: Principal | ICD-10-CM

## 2017-07-26 DIAGNOSIS — L0390 Cellulitis, unspecified: Secondary | ICD-10-CM

## 2017-07-26 DIAGNOSIS — I739 Peripheral vascular disease, unspecified: Secondary | ICD-10-CM

## 2017-07-26 LAB — BASIC METABOLIC PANEL
Anion gap: 20 — ABNORMAL HIGH (ref 5–15)
BUN: 96 mg/dL — AB (ref 8–23)
CHLORIDE: 91 mmol/L — AB (ref 98–111)
CO2: 18 mmol/L — AB (ref 22–32)
CREATININE: 6.83 mg/dL — AB (ref 0.61–1.24)
Calcium: 9.1 mg/dL (ref 8.9–10.3)
GFR calc Af Amer: 8 mL/min — ABNORMAL LOW (ref >60)
GFR calc non Af Amer: 7 mL/min — ABNORMAL LOW (ref >60)
Glucose, Bld: 133 mg/dL — ABNORMAL HIGH (ref 70–99)
Potassium: 4.9 mmol/L (ref 3.5–5.1)
Sodium: 129 mmol/L — ABNORMAL LOW (ref 135–145)

## 2017-07-26 LAB — GLUCOSE, CAPILLARY
GLUCOSE-CAPILLARY: 123 mg/dL — AB (ref 70–99)
GLUCOSE-CAPILLARY: 98 mg/dL (ref 70–99)
GLUCOSE-CAPILLARY: 211 mg/dL — AB (ref 70–99)
Glucose-Capillary: 164 mg/dL — ABNORMAL HIGH (ref 70–99)

## 2017-07-26 LAB — CBC
HCT: 28.4 % — ABNORMAL LOW (ref 39.0–52.0)
Hemoglobin: 8.7 g/dL — ABNORMAL LOW (ref 13.0–17.0)
MCH: 29 pg (ref 26.0–34.0)
MCHC: 30.6 g/dL (ref 30.0–36.0)
MCV: 94.7 fL (ref 78.0–100.0)
PLATELETS: 276 10*3/uL (ref 150–400)
RBC: 3 MIL/uL — AB (ref 4.22–5.81)
RDW: 15.5 % (ref 11.5–15.5)
WBC: 17.5 10*3/uL — ABNORMAL HIGH (ref 4.0–10.5)

## 2017-07-26 LAB — PHOSPHORUS: PHOSPHORUS: 8.3 mg/dL — AB (ref 2.5–4.6)

## 2017-07-26 MED ORDER — VANCOMYCIN HCL IN DEXTROSE 1-5 GM/200ML-% IV SOLN
INTRAVENOUS | Status: AC
  Administered 2017-07-26: 1000 mg
  Filled 2017-07-26: qty 200

## 2017-07-26 MED ORDER — ACETAMINOPHEN 325 MG PO TABS
650.0000 mg | ORAL_TABLET | Freq: Four times a day (QID) | ORAL | Status: DC
  Administered 2017-07-26 – 2017-08-10 (×50): 650 mg via ORAL
  Filled 2017-07-26 (×53): qty 2

## 2017-07-26 MED ORDER — DARBEPOETIN ALFA 60 MCG/0.3ML IJ SOSY
PREFILLED_SYRINGE | INTRAMUSCULAR | Status: AC
  Filled 2017-07-26: qty 0.3

## 2017-07-26 MED ORDER — CINACALCET HCL 30 MG PO TABS
30.0000 mg | ORAL_TABLET | Freq: Every day | ORAL | Status: DC
  Administered 2017-07-26 – 2017-07-30 (×5): 30 mg via ORAL
  Filled 2017-07-26 (×5): qty 1

## 2017-07-26 MED ORDER — DARBEPOETIN ALFA 60 MCG/0.3ML IJ SOSY
60.0000 ug | PREFILLED_SYRINGE | INTRAMUSCULAR | Status: DC
  Administered 2017-07-26: 60 ug via INTRAVENOUS
  Filled 2017-07-26 (×2): qty 0.3

## 2017-07-26 MED ORDER — SEVELAMER CARBONATE 800 MG PO TABS
2400.0000 mg | ORAL_TABLET | Freq: Three times a day (TID) | ORAL | Status: DC
  Administered 2017-07-26 – 2017-08-10 (×38): 2400 mg via ORAL
  Filled 2017-07-26 (×39): qty 3

## 2017-07-26 NOTE — Procedures (Signed)
I was present at this dialysis session. I have reviewed the session itself and made appropriate changes.   Filed Weights   07/25/17 1403 07/26/17 0558 07/26/17 1120  Weight: 98.6 kg (217 lb 6 oz) 99.6 kg (219 lb 9.3 oz) 99.3 kg (218 lb 14.7 oz)    Recent Labs  Lab 07/26/17 0607  NA 129*  K 4.9  CL 91*  CO2 18*  GLUCOSE 133*  BUN 96*  CREATININE 6.83*  CALCIUM 9.1  PHOS 8.3*    Recent Labs  Lab 07/25/17 1050 07/26/17 0607  WBC 18.0* 17.5*  NEUTROABS 15.4*  --   HGB 10.3* 8.7*  HCT 30.2* 28.4*  MCV 94.7 94.7  PLT 300 276    Scheduled Meds: . acetaminophen  650 mg Oral Q6H  . aspirin EC  81 mg Oral Daily  . atorvastatin  80 mg Oral QHS  . calcitRIOL  0.5 mcg Oral Daily  . carvedilol  3.125 mg Oral BID WC  . Chlorhexidine Gluconate Cloth  6 each Topical Q0600  . cholecalciferol  1,000 Units Oral Daily  . clopidogrel  75 mg Oral Daily  . gabapentin  300 mg Oral QHS  . heparin  5,000 Units Subcutaneous Q8H  . insulin aspart  0-15 Units Subcutaneous TID WC  . insulin aspart  0-5 Units Subcutaneous QHS  . isosorbide-hydrALAZINE  1 tablet Oral BID  . multivitamin  1 tablet Oral QHS  . primidone  100 mg Oral Daily  . sevelamer carbonate  1,600 mg Oral TID WC  . vitamin B-12  1,000 mcg Oral Daily   Continuous Infusions: . vancomycin     PRN Meds:.ALPRAZolam, nitroGLYCERIN, traMADol   Donetta Potts,  MD 07/26/2017, 1:17 PM

## 2017-07-26 NOTE — Plan of Care (Signed)
  Problem: Coping: Goal: Level of anxiety will decrease Outcome: Progressing   Problem: Pain Managment: Goal: General experience of comfort will improve Outcome: Progressing   Problem: Safety: Goal: Ability to remain free from injury will improve Outcome: Progressing   

## 2017-07-26 NOTE — Consult Note (Signed)
[facility] Nurse wound consult note Reason for Consult:Nonhealing vascular wounds.  ABI performed and Right extremity is low normal 1.6 and Left ABI could not be ascertained.  TBI was abnormal in left extremity.  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  Largest area is 3 cm x 4.2 cm unable to determine depth due to the presence of devitalized tissue.  Wound bed:100% devitalized tissue.  Drainage (amount, consistency, odor) none  Wounds are dry and painful  Periwound:erythema and pain Dressing procedure/placement/frequency: Cleanse wounds to right lower leg with NS.  Apply Aquacel Ag that has been moistened with NS to wound bed .  Cover with 4x4 and kerlix.  Change daily.  Will not follow at this time.  Please re-consult if needed.  Domenic Moras RN BSN [place] Pager [phone]

## 2017-07-26 NOTE — Progress Notes (Signed)
Family Medicine Teaching Service Daily Progress Note Intern Pager: [phone]  Patient name: Mitchell Garcia Medical record number: [id number] Date of birth: [birth year] Age: 72 y.o. Gender: male  Primary Care Provider: [name], MD Consultants: nephrology, wound care Code Status: full  Pt Overview and Major Events to Date:  7/1- admitted to [facility] w/ nonhealing RIGHT leg wound  Assessment and Plan: Mitchell Garcia is a 72 y.o. male presenting with cellulitis of leg . PMH is significant for ESRD on HD, T2DM, anemia, combined systolic/diastolic HF, CAD with history of NSTEMI, chronic leg wound  Severe Purulent Cellulitis of RIGHT Leg - non-healing ulcer. X-rays of leg and knee reassuring, no bony abnormalities.  - Vitals per floor. Pulse Ox with vitals. - IV Vancomycin per pharmacy consult - wound care consulted, appreciate recommendations - will discuss with nephro possibility of this being calciphylaxis  - ABIs pending - Pending Blood cultures - Pain control with Tramadol 100mg  Q6h AS NEEDED - monitor WBC trend  Klebsiella UTI- noted on outpatient records from 6/28, patient reports he makes very little urine, currently denies burning with urination. This does not appear to have been treated. -repeat urinalysis  ESRD on HD - TTS schedule. - Nephrology following for HD - Avoid nephrotoxic agents. - Follow electrolytes daily - Strict I/O's - continue home renal meds  Poorly controlled T2DM - Chronic.  Last A1c 7.6 in 09/2016. AM CBG 133 - mSSI with evening coverage - can add long acting insulin as needed  - Diabetic/renal diet  Anemia of chronic disease - Stable.  Hgb this am 8.7, baseline ~8 - continue to monitor for signs of blood loss, daily CBC  Combined HF - Chronic.  Currently asymptomatic aside from bilateral LE edema.  Most recent Echo 07/09/17 40-45%. - Strict I/O's - Continue to monitor. - continue home HF medications  CAD with history of NSTEMI -  Chronic. S/P Stent x2 per record.Left Heart Cath 9/14 significant for multivessel disease with proximal LAD stenosis of 70%, total occlusion of mid LCx, and 95% of RCA stenosis.  On statin, Nitro, Coreg. - Continue ASA and Plavix - continue home meds   FEN/GI: renal/carb modified diet Prophylaxis: on plavix, heparin subq  Disposition: continued inpatient care  Subjective:  Reports leg is painful. 100 mg tramadol helps make pain bearable but does not resolve pain. He is hoping leg can be salvaged.   Objective: Temp:  [98.5 F (36.9 C)-98.8 F (37.1 C)] 98.5 F (36.9 C) (07/02 0508) Pulse Rate:  [59-74] 60 (07/02 0508) Resp:  [16-19] 18 (07/02 0508) BP: (94-134)/(51-74) 94/51 (07/02 0508) SpO2:  [92 %-96 %] 92 % (07/02 0508) Weight:  [217 lb 6 oz (98.6 kg)-219 lb 9.3 oz (99.6 kg)] 219 lb 9.3 oz (99.6 kg) (07/02 0558) Physical Exam: General: elderly gentleman laying in bed in NAD Cardiovascular: RRR. 3/6 systolic murmur. +2 radial pulses bilaterally. Unable to palpate DP and PT pulses bilaterally  Respiratory: normal work of breathing Abdomen: soft, NTND.  Extremities: right leg erythematous, warm compared to left Skin: redness of right leg from ankle to knee  Laboratory: Recent Labs  Lab 07/25/17 1050 07/26/17 0607  WBC 18.0* 17.5*  HGB 10.3* 8.7*  HCT 30.2* 28.4*  PLT 300 276   Recent Labs  Lab 07/25/17 1050 07/26/17 0607  NA 131* 129*  K 4.5 4.9  CL 92* 91*  CO2 20* 18*  BUN 83* 96*  CREATININE 6.15* 6.83*  CALCIUM 9.2 9.1  PROT 6.7  --  BILITOT 0.5  --   ALKPHOS 76  --   ALT 22  --   AST 21  --   GLUCOSE 145* 133*   Results for orders placed or performed during the hospital encounter of 07/07/17  MRSA PCR Screening     Status: None   Collection Time: 07/07/17  8:02 PM  Result Value Ref Range Status   MRSA by PCR NEGATIVE NEGATIVE Final    Comment:        The GeneXpert MRSA Assay (FDA approved for NASAL specimens only), is one component of  a comprehensive MRSA colonization surveillance program. It is not intended to diagnose MRSA infection nor to guide or monitor treatment for MRSA infections. Performed at [facility], [place] [address] [place], [place] [address]     Culture, Urine Urine Urine, Clean CatchResulted: 07/25/2017 1:47 PM [facility] Component Name Value Ref Range  Urine Culture KLEBSIELLA OXYTOCA (A)   Specimen Collected on  Urine - Urine, Clean Catch 07/22/2017 5:38 PM   Organism Antibiotic Method Susceptibility  Unknown Organism Amoxicillin + Clavulanate MIC >=32 ug/mL: Resistant   Ampicillin MIC >=32 ug/mL: Resistant   Aztreonam MIC 16 ug/mL: Intermediate   Cefazolin MIC >=64 ug/mL: Resistant   Cefepime MIC <=1 ug/mL: Susceptible   Ceftriaxone MIC 4 ug/mL: Susceptible   Ciprofloxacin MIC <=0.25 ug/mL: Susceptible   Gentamicin MIC <=1 ug/mL: Susceptible   Nitrofurantoin MIC <=16 ug/mL: Susceptible   Piperacillin + Tazobactam MIC >=128 ug/mL: Resistant   Trimethoprim + Sulfamethoxazole MIC <=20 ug/mL: Susceptible  Comment:  Ceftriaxone: Updated CLSI breakpoints indicate an MIC >2 ug/mL is non-susceptible.[facility] MICs are based on one-time FDA recommendations at the time of test approval.CLSI breakpoints are reviewed annually and should be used for therapeutic decision  making.    Imaging/Diagnostic Tests: Dg Knee 1-2 Views Right  Result Date: 07/25/2017 CLINICAL DATA:  Wound cellulitis. EXAM: RIGHT KNEE - 1-2 VIEW COMPARISON:  07/25/2017 FINDINGS: No joint effusion identified. Mild diffuse soft tissue swelling noted. Vascular calcifications are noted no fracture or dislocation identified. No focal bone erosions. IMPRESSION: No focal bone abnormality. Soft tissue swelling. Electronically Signed   By: Kerby Moors M.D.   On: 07/25/2017 17:26   Dg Tibia/fibula Right  Result Date: 07/25/2017 CLINICAL DATA:  Soft tissue wounds about the right knee and lower leg. EXAM: RIGHT TIBIA AND FIBULA - 2  VIEW COMPARISON:  None. FINDINGS: No acute bony or joint abnormality is identified. No soft tissue gas or radiopaque foreign body is seen. Atherosclerosis is noted. IMPRESSION: No acute abnormality. Atherosclerosis. Electronically Signed   By: Inge Rise M.D.   On: 07/25/2017 17:28     Steve Rattler, DO 07/26/2017, 8:42 AM PGY-3, [place] Intern pager: [phone], text pages welcome

## 2017-07-26 NOTE — Consult Note (Addendum)
[facility] Renal Consultation Note    Indication for Consultation:  Management of ESRD/hemodialysis; anemia, hypertension/volume and secondary hyperparathyroidism  HPI: Mitchell Garcia is a 72 y.o. male.  The patient is a 72 y.o. year-old male with ESRD on HD, DM, HTN, CAD s/p PCI, chronic combined CHF, DM on insulin. HD initiated 05/19/17 during [facility] admission with progressive CKD with decompensated CHF. [place] placed 05/18/17 by IR. Has maturing R forearm fistula (created about 8 weeks ago by Dr. Rebecca Eaton, [place] Maser). Currently dialyzing at [facility] on TTS schedule.  Mitchell Garcia presented to [facility] ED with recurrent redness, pain, and right knee pain.  He was admitted for IV antibiotics and further management of his right leg cellulitis.  We were consulted again to help manage his ESRD and dialysis while he remains an inpatient.  He was seen by our service on 07/07/17 after he was admitted with syncope, however his right leg cellulitis was present at that time and was suspicious for calciphylaxis, although no biopsies were obtained.  His phos has remained elevated and his vitamin D was discontinued at his last hospitalization 2 weeks ago due to concerns about calciphylaxis.  Unfortunately it appears that he has continued with this medication since discharge on 07/10/17.    Past Medical History:  Diagnosis Date  . Acute kidney injury superimposed on chronic kidney disease ([place]) 10/15/2016  . Altered mental state 01/25/2013  . Anemia of chronic disease 01/25/2013  . CAD S/P percutaneous coronary angioplasty    a.not to be a candidate for CABG. On 9/19, went back to cath lab for PCI s/p orbital atherectomy with DES to ostial RCA, and PTCA/DES to distal RCA. Residual 50% prox LAD, 70% D1, CTO mLCx noted by initial diagnostic cath 10/08/16 to be treated medically.  . Cancer ([place])   . Cellulitis of right lower extremity 07/25/2017  . Chronic combined systolic and diastolic CHF (congestive  heart failure) ([place])   . CKD (chronic kidney disease), stage IV ([place])   . Diabetes mellitus with renal complications (HCC)    uncontrolled  . Essential hypertension 10/15/2016  . Humerus head fracture 01/27/2013  . Hyperlipidemia   . Hyponatremia 01/25/2013  . Insulin dependent diabetes mellitus ([place])   . Ischemic cardiomyopathy 10/15/2016  . MI (myocardial infarction) ([place])    years ago  . Stroke ([place])   . Transaminitis 01/25/2013  . Uncontrolled diabetes mellitus ([place]) 10/15/2016   Past Surgical History:  Procedure Laterality Date  . CORONARY STENT INTERVENTION N/A 10/13/2016   Procedure: CORONARY STENT INTERVENTION;  Surgeon: Burnell Blanks, MD;  Location: [place] CV LAB;  Service: Cardiovascular;  Laterality: N/A;  . IR FLUORO GUIDE CV LINE RIGHT  05/19/2017  . IR US GUIDE VASC ACCESS RIGHT  05/19/2017  . LEFT HEART CATH AND CORONARY ANGIOGRAPHY N/A 10/08/2016   Procedure: LEFT HEART CATH AND CORONARY ANGIOGRAPHY;  Surgeon: Nelva Bush, MD;  Location: [place] CV LAB;  Service: Cardiovascular;  Laterality: N/A;  . SKIN CANCER EXCISION    . TEMPORARY PACEMAKER N/A 10/13/2016   Procedure: Temporary Pacemaker;  Surgeon: Burnell Blanks, MD;  Location: [place] CV LAB;  Service: Cardiovascular;  Laterality: N/A;   Family History:   Family History  Problem Relation Age of Onset  . Hyperlipidemia Father    Social History:  reports that he has never smoked. He has never used smokeless tobacco. He reports that he does not drink alcohol or use drugs. Allergies  Allergen Reactions  .  Oxycodone Other (See Comments)    DIFFICULT TO WAKE UP  . Tramadol Nausea And Vomiting  . Lisinopril Other (See Comments)    Hyperkalemia   . Losartan Other (See Comments)    Hyperkalemia    Prior to Admission medications   Medication Sig Start Date End Date Taking? Authorizing Provider  ALPRAZolam Duanne Moron) 0.5 MG tablet Take 1 tablet (0.5 mg total) by mouth at bedtime as  needed for anxiety. 02/01/13  Yes Theodis Blaze, MD  aspirin EC 81 MG tablet Take 81 mg by mouth daily.   Yes [provider]  atorvastatin (LIPITOR) 80 MG tablet Take 80 mg by mouth at bedtime.   Yes [provider]  carvedilol (COREG) 3.125 MG tablet Take 1 tablet (3.125 mg total) by mouth 2 (two) times daily with a meal. 05/24/17  Yes Emokpae, Courage, MD  cholecalciferol (VITAMIN D) 1000 units tablet Take 1,000 Units by mouth daily.   Yes [provider]  clopidogrel (PLAVIX) 75 MG tablet Take 1 tablet (75 mg total) by mouth daily. 11/08/16  Yes Isaiah Serge, NP  docusate sodium 100 MG CAPS Take 100 mg by mouth 2 (two) times daily. 02/01/13  Yes Theodis Blaze, MD  gabapentin (NEURONTIN) 300 MG capsule Take 1 capsule (300 mg total) by mouth at bedtime. 05/24/17  Yes Emokpae, Courage, MD  insulin aspart protamine- aspart (NOVOLOG MIX 70/30) (70-30) 100 UNIT/ML injection Inject 30-40 Units into the skin 2 (two) times daily with a meal.   Yes [provider]  isosorbide-hydrALAZINE (BIDIL) 20-37.5 MG tablet Take 1 tablet by mouth 2 (two) times daily. 05/24/17  Yes Emokpae, Courage, MD  multivitamin (RENA-VIT) TABS tablet Take 1 tablet by mouth at bedtime. 05/24/17  Yes Emokpae, Courage, MD  nitroGLYCERIN (NITROSTAT) 0.4 MG SL tablet Place 1 tablet (0.4 mg total) under the tongue every 5 (five) minutes x 3 doses as needed for chest pain. 10/18/16  Yes Cheryln Manly, NP  primidone (MYSOLINE) 50 MG tablet Take 100 mg by mouth daily.   Yes [provider]  sevelamer carbonate (RENVELA) 800 MG tablet Take 2 tablets (1,600 mg total) by mouth 3 (three) times daily with meals. 07/10/17  Yes Diallo, Abdoulaye, MD  traMADol (ULTRAM) 50 MG tablet Take 2 tablets (100 mg total) by mouth 2 (two) times daily. Patient taking differently: Take 100 mg by mouth every 6 (six) hours as needed for moderate pain.  02/01/13  Yes Theodis Blaze, MD  vitamin B-12 (CYANOCOBALAMIN) 500  MCG tablet Take 1,000 mcg by mouth daily.   Yes [provider]  acetaminophen (TYLENOL) 325 MG tablet Take 2 tablets (650 mg total) by mouth every 4 (four) hours as needed for mild pain, fever or headache. 05/24/17   Roxan Hockey, MD  calcitRIOL (ROCALTROL) 0.5 MCG capsule Take 1 capsule (0.5 mcg total) by mouth daily. Patient not taking: Reported on 07/25/2017 05/24/17   Roxan Hockey, MD   Current Facility-Administered Medications  Medication Dose Route Frequency Provider Last Rate Last Dose  . acetaminophen (TYLENOL) tablet 650 mg  650 mg Oral Q6H Riccio, Angela C, DO      . ALPRAZolam Duanne Moron) tablet 0.5 mg  0.5 mg Oral QHS PRN Steve Rattler, DO      . aspirin EC tablet 81 mg  81 mg Oral Daily Riccio, Angela C, DO      . atorvastatin (LIPITOR) tablet 80 mg  80 mg Oral QHS Riccio, Angela C, DO   80  mg at 07/25/17 2219  . calcitRIOL (ROCALTROL) capsule 0.5 mcg  0.5 mcg Oral Daily Riccio, Angela C, DO   0.5 mcg at 07/26/17 1041  . carvedilol (COREG) tablet 3.125 mg  3.125 mg Oral BID WC Riccio, Angela C, DO   3.125 mg at 07/25/17 1722  . Chlorhexidine Gluconate Cloth 2 % PADS 6 each  6 each Topical Q0600 Donato Heinz, MD   6 each at 07/26/17 0600  . cholecalciferol (VITAMIN D) tablet 1,000 Units  1,000 Units Oral Daily Riccio, Angela C, DO   1,000 Units at 07/26/17 1017  . clopidogrel (PLAVIX) tablet 75 mg  75 mg Oral Daily Riccio, Angela C, DO   75 mg at 07/26/17 1018  . gabapentin (NEURONTIN) capsule 300 mg  300 mg Oral QHS Riccio, Angela C, DO   300 mg at 07/25/17 2219  . heparin injection 5,000 Units  5,000 Units Subcutaneous Q8H Riccio, Angela C, DO      . insulin aspart (novoLOG) injection 0-15 Units  0-15 Units Subcutaneous TID WC Riccio, Angela C, DO   2 Units at 07/26/17 0824  . insulin aspart (novoLOG) injection 0-5 Units  0-5 Units Subcutaneous QHS Riccio, Angela C, DO      . isosorbide-hydrALAZINE (BIDIL) 20-37.5 MG per tablet 1 tablet  1 tablet Oral BID Lucila Maine C, DO   1 tablet at 07/25/17 2219  . multivitamin (RENA-VIT) tablet 1 tablet  1 tablet Oral QHS Lucila Maine C, DO   1 tablet at 07/25/17 2220  . nitroGLYCERIN (NITROSTAT) SL tablet 0.4 mg  0.4 mg Sublingual Q5 Min x 3 PRN Riccio, Angela C, DO      . primidone (MYSOLINE) tablet 100 mg  100 mg Oral Daily Riccio, Angela C, DO   100 mg at 07/26/17 1040  . sevelamer carbonate (RENVELA) tablet 1,600 mg  1,600 mg Oral TID WC Riccio, Angela C, DO   1,600 mg at 07/26/17 1017  . traMADol (ULTRAM) tablet 100 mg  100 mg Oral Q6H PRN Riccio, Angela C, DO   100 mg at 07/26/17 1041  . vancomycin (VANCOCIN) IVPB 1000 mg/200 mL premix  1,000 mg Intravenous Q T,Th,Sa-HD Lind Covert, MD      . vitamin B-12 (CYANOCOBALAMIN) tablet 1,000 mcg  1,000 mcg Oral Daily Lucila Maine C, DO   1,000 mcg at 07/26/17 1041   Labs: Basic Metabolic Panel: Recent Labs  Lab 07/25/17 1050 07/26/17 0607  NA 131* 129*  K 4.5 4.9  CL 92* 91*  CO2 20* 18*  GLUCOSE 145* 133*  BUN 83* 96*  CREATININE 6.15* 6.83*  CALCIUM 9.2 9.1  PHOS  --  8.3*   Liver Function Tests: Recent Labs  Lab 07/25/17 1050  AST 21  ALT 22  ALKPHOS 76  BILITOT 0.5  PROT 6.7  ALBUMIN 3.0*   No results for input(s): LIPASE, AMYLASE in the last 168 hours. No results for input(s): AMMONIA in the last 168 hours. CBC: Recent Labs  Lab 07/25/17 1050 07/26/17 0607  WBC 18.0* 17.5*  NEUTROABS 15.4*  --   HGB 10.3* 8.7*  HCT 30.2* 28.4*  MCV 94.7 94.7  PLT 300 276   Cardiac Enzymes: No results for input(s): CKTOTAL, CKMB, CKMBINDEX, TROPONINI in the last 168 hours. CBG: Recent Labs  Lab 07/25/17 1727 07/25/17 2142 07/26/17 0749  GLUCAP 130* 185* 123*   Iron Studies: No results for input(s): IRON, TIBC, TRANSFERRIN, FERRITIN in the last 72 hours. Studies/Results: Dg Knee 1-2 Views Right  Result Date: 07/25/2017 CLINICAL DATA:  Wound cellulitis. EXAM: RIGHT KNEE - 1-2 VIEW COMPARISON:  07/25/2017 FINDINGS: No  joint effusion identified. Mild diffuse soft tissue swelling noted. Vascular calcifications are noted no fracture or dislocation identified. No focal bone erosions. IMPRESSION: No focal bone abnormality. Soft tissue swelling. Electronically Signed   By: Kerby Moors M.D.   On: 07/25/2017 17:26   Dg Tibia/fibula Right  Result Date: 07/25/2017 CLINICAL DATA:  Soft tissue wounds about the right knee and lower leg. EXAM: RIGHT TIBIA AND FIBULA - 2 VIEW COMPARISON:  None. FINDINGS: No acute bony or joint abnormality is identified. No soft tissue gas or radiopaque foreign body is seen. Atherosclerosis is noted. IMPRESSION: No acute abnormality. Atherosclerosis. Electronically Signed   By: Inge Rise M.D.   On: 07/25/2017 17:28    ROS: A comprehensive review of systems was negative except for: Constitutional: positive for fatigue and malaise Integument/breast: positive for rash, skin color change and skin lesion(s) Musculoskeletal: positive for right leg and knee pain Physical Exam: Vitals:   07/26/17 1130 07/26/17 1200 07/26/17 1230 07/26/17 1300  BP: (!) 95/36 (!) 117/59 137/64 (!) 122/56  Pulse: 60 66 70 62  Resp:      Temp:      TempSrc:      SpO2:      Weight:      Height:          Weight change:   Intake/Output Summary (Last 24 hours) at 07/26/2017 1318 Last data filed at 07/26/2017 0500 Gross per 24 hour  Intake 360 ml  Output -  Net 360 ml   BP (!) 122/56   Pulse 62   Temp (!) 97.4 F (36.3 C) (Oral)   Resp 18   Ht 5\' 11"  (1.803 m)   Wt 99.3 kg (218 lb 14.7 oz)   SpO2 94%   BMI 30.53 kg/m  General appearance: fatigued and mild distress Head: Normocephalic, without obvious abnormality, atraumatic Eyes: negative findings: lids and lashes normal and conjunctivae and sclerae normal Resp: clear to auscultation bilaterally Cardio: regular rate and rhythm and no rub GI: soft, non-tender; bowel sounds normal; no masses,  no organomegaly Extremities: right leg with  erythema, induration, ulceration of right calf extending to knee and ankle, tenderr, also with small early painful ulceration at base of his left calf Vascular access: RIJ TDC and R Cimino AVF with +T/B Dialysis Access: RIJ tdc and R cimino AVF not yet mature  Outpatient HD Prescription Jule Ser [facility] [phone] Extension [phone] Charge RN) TTS 3K2.5 Ca 138Na 3hrs 45 min 400/800 37 temp EDW 97 kg (usually gets there) "Prn challenge by 0.5 as tolerated" TDC 1.6 ml hep dwell  3000 bolus heparin, no maintenance 2 mcg Hectorol 2 mcg TTS Ferrlecit 125 mg once a week No ESA  Vanco 1 gm and cefipime 2 gm (started 07/05/17) to go through the 19th for LE "cellulitus"+    Assessment/Plan: 1.  Right leg ulceration and surrounding cellulitis - was suspicious for calciphylaxis when he was seen on 07/07/17 by Dr. Lorrene Reid, which was a little unusual as he was new to HD, however he does have an elevated phos and iPTH.  His vitamin D was stopped during his last admission but it appears to still be on his medication list.  Recommend surgery consult for deep tissue biopsy to confirm dx of calciphylaxis.  Will increase renvela to 2.4 grams per meal and add sensipar and discontinue calcitriol and ergocalciferol.  Will also use lower  calcium bath.  Will hold off on sodium thiosulfate for now and continue with abx and local wound care.  Vascular studies pending. 2.  ESRD -  Continue with HD q TTS 3.  Hypertension/volume  - stable 4.  Anemia  - was not on aranesp will start and follow H/H and iron stores 5.  Metabolic bone disease -  Needs much better phos control and will stop vitamin D and add sensipar as his ulceration is suspicious for calciphylaxis 6.  Nutrition - cont with renal diet, carbohydrate modified 7. Vascular access- RAVF maturing, RIJ TDC functioning well.  Donetta Potts, MD [place] Pager [phone] 07/26/2017, 1:18 PM

## 2017-07-26 NOTE — Progress Notes (Signed)
Bilateral ABIs and TBIs completed. Right ABI 1.6 TBI is low normal Left ABI could not be ascertained due to non compressible artery. TBI is abnormal, [facility], [place] 07/26/2017 9:58 AM

## 2017-07-27 ENCOUNTER — Encounter (HOSPITAL_COMMUNITY): Payer: Self-pay | Admitting: General Practice

## 2017-07-27 ENCOUNTER — Other Ambulatory Visit: Payer: Self-pay

## 2017-07-27 LAB — URINALYSIS, ROUTINE W REFLEX MICROSCOPIC
BILIRUBIN URINE: NEGATIVE
GLUCOSE, UA: NEGATIVE mg/dL
Ketones, ur: NEGATIVE mg/dL
NITRITE: NEGATIVE
Specific Gravity, Urine: 1.017 (ref 1.005–1.030)
pH: 5 (ref 5.0–8.0)

## 2017-07-27 LAB — RENAL FUNCTION PANEL
ALBUMIN: 2.5 g/dL — AB (ref 3.5–5.0)
Anion gap: 13 (ref 5–15)
BUN: 42 mg/dL — AB (ref 8–23)
CALCIUM: 7.9 mg/dL — AB (ref 8.9–10.3)
CO2: 23 mmol/L (ref 22–32)
Chloride: 93 mmol/L — ABNORMAL LOW (ref 98–111)
Creatinine, Ser: 4.24 mg/dL — ABNORMAL HIGH (ref 0.61–1.24)
GFR calc Af Amer: 15 mL/min — ABNORMAL LOW (ref >60)
GFR, EST NON AFRICAN AMERICAN: 13 mL/min — AB (ref >60)
Glucose, Bld: 146 mg/dL — ABNORMAL HIGH (ref 70–99)
PHOSPHORUS: 5.5 mg/dL — AB (ref 2.5–4.6)
Potassium: 3.8 mmol/L (ref 3.5–5.1)
SODIUM: 129 mmol/L — AB (ref 135–145)

## 2017-07-27 LAB — GLUCOSE, CAPILLARY
GLUCOSE-CAPILLARY: 222 mg/dL — AB (ref 70–99)
Glucose-Capillary: 218 mg/dL — ABNORMAL HIGH (ref 70–99)
Glucose-Capillary: 164 mg/dL — ABNORMAL HIGH (ref 70–99)
Glucose-Capillary: 190 mg/dL — ABNORMAL HIGH (ref 70–99)

## 2017-07-27 MED ORDER — OXYCODONE HCL 5 MG PO TABS
2.5000 mg | ORAL_TABLET | Freq: Once | ORAL | Status: AC
  Administered 2017-07-27: 2.5 mg via ORAL
  Filled 2017-07-27: qty 1

## 2017-07-27 NOTE — Progress Notes (Signed)
Family Medicine Teaching Service Daily Progress Note Intern Pager: [phone]  Patient name: Mitchell Garcia Medical record number: [id number] Date of birth: 1945-12-14 Age: 72 y.o. Gender: male  Primary Care Provider: [name], MD Consultants: nephrology, wound care Code Status: full  Pt Overview and Major Events to Date:  7/1- admitted to [facility] w/ nonhealing RIGHT leg wound 7/2- HD  Assessment and Plan: Mitchell Garcia is a 72 y.o. male presenting with cellulitis of leg . 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 ABI's with low normal flow to right leg. Patient with many risk factors for poor wound healing. - Vitals per floor. Pulse Ox with vitals. - continue IV Vancomycin w/ HD per pharmacy, switch to PO antibiotics once leg improved - wound care recommending daily dressing changes with aquacel ag moistened with NS covered w/ 4x4 and kerlix -medications to control phosphate increased by nephro -PTH, vit D levels pending -blood cx NGTD - Pain control with Tramadol 100mg  Q6h prn -scheduled tylenol - monitor WBC trend  Klebsiella UTI- noted on outpatient records from 6/28, patient reports he makes very little urine, currently denies burning with urination. This does not appear to have been treated. -repeat urinalysis  ESRD on HD - TTS schedule. - Nephrology following for HD - Avoid nephrotoxic agents. - Follow electrolytes daily - Strict I/O's - continue home renal meds  Poorly controlled T2DM - Chronic.  Last A1c 7.6 in 09/2016. AM CBG 222 - mSSI with evening coverage - can add long acting insulin as needed  - Diabetic/renal diet  Anemia of chronic disease - Stable. baseline ~8 -continue to monitor  Combined HF - Chronic.  Currently asymptomatic  aside from bilateral LE edema.  Most recent Echo 07/09/17 40-45%. - Strict I/O's - Continue to monitor. - continue home HF medications  CAD with history of NSTEMI - Chronic. S/P Stent x2 per record.Left Heart Cath 9/14 significant for multivessel disease with proximal LAD stenosis of 70%, total occlusion of mid LCx, and 95% of RCA stenosis.  On statin, Nitro, Coreg. - Continue ASA and Plavix - continue home meds   FEN/GI: renal/carb modified diet Prophylaxis: on plavix, heparin subq  Disposition: continued inpatient care, dispo pending PT/OT and cellulitis resolution  Subjective:  Reports he did not sleep well. Leg is painful and he feels his hip is now hurting. He has not been out of bed. He has not been able to move leg much due to pain. He is hesitant to work with PT due to fear of pain.   Objective: Temp:  [97.4 F (36.3 C)-98.3 F (36.8 C)] 98.2 F (36.8 C) (07/03 0537) Pulse Rate:  [45-83] 69 (07/03 0804) Resp:  [18] 18 (07/03 0537) BP: (95-150)/(36-72) 97/51 (07/03 0804) SpO2:  [94 %-95 %] 95 % (07/03 0804) Weight:  [212 lb 8.4 oz (96.4 kg)-218 lb 14.7 oz (99.3 kg)] 213 lb 13.5 oz (97 kg) (07/03 0500) Physical Exam: General: elderly gentleman laying in bed in NAD Cardiovascular: RRR. 3/6 systolic murmur.  Respiratory: normal work of breathing. CTAB. Abdomen: soft, NTND. +BS Extremities: right leg erythematous from ankle to knee inside marked lines, warm compared to left, dressing in place Skin: redness of right leg from ankle to knee Neuro: no focal deficits  Laboratory: Recent Labs  Lab 07/25/17 1050 07/26/17 0607  WBC  18.0* 17.5*  HGB 10.3* 8.7*  HCT 30.2* 28.4*  PLT 300 276   Recent Labs  Lab 07/25/17 1050 07/26/17 0607 07/27/17 0609  NA 131* 129* 129*  K 4.5 4.9 3.8  CL 92* 91* 93*  CO2 20* 18* 23  BUN 83* 96* 42*  CREATININE 6.15* 6.83* 4.24*  CALCIUM 9.2 9.1 7.9*  PROT 6.7  --   --   BILITOT 0.5  --   --   ALKPHOS 76  --   --   ALT 22  --    --   AST 21  --   --   GLUCOSE 145* 133* 146*   CBG (last 3)  Recent Labs    07/26/17 1713 07/26/17 2246 07/27/17 0812  GLUCAP 164* 211* 222*    Results for orders placed or performed during the hospital encounter of 07/25/17  Blood culture (routine x 2)     Status: None (Preliminary result)   Collection Time: 07/25/17 12:30 PM  Result Value Ref Range Status   Specimen Description BLOOD LEFT ANTECUBITAL  Final   Special Requests   Final    BOTTLES DRAWN AEROBIC ONLY Blood Culture adequate volume   Culture   Final    NO GROWTH < 24 HOURS Performed at [facility], [place] [address] [place], [place] [address]    Report Status PENDING  Incomplete  Blood culture (routine x 2)     Status: None (Preliminary result)   Collection Time: 07/25/17 12:38 PM  Result Value Ref Range Status   Specimen Description BLOOD SITE NOT SPECIFIED  Final   Special Requests   Final    BOTTLES DRAWN AEROBIC ONLY Blood Culture results may not be optimal due to an inadequate volume of blood received in culture bottles   Culture   Final    NO GROWTH < 24 HOURS Performed at [facility], [place] [address] [place], [place] [address]    Report Status PENDING  Incomplete    Culture, Urine Urine Urine, Clean CatchResulted: 07/25/2017 1:47 PM [facility] Component Name Value Ref Range  Urine Culture KLEBSIELLA OXYTOCA (A)   Specimen Collected on  Urine - Urine, Clean Catch 07/22/2017 5:38 PM   Organism Antibiotic Method Susceptibility  Unknown Organism Amoxicillin + Clavulanate MIC >=32 ug/mL: Resistant   Ampicillin MIC >=32 ug/mL: Resistant   Aztreonam MIC 16 ug/mL: Intermediate   Cefazolin MIC >=64 ug/mL: Resistant   Cefepime MIC <=1 ug/mL: Susceptible   Ceftriaxone MIC 4 ug/mL: Susceptible   Ciprofloxacin MIC <=0.25 ug/mL: Susceptible   Gentamicin MIC <=1 ug/mL: Susceptible   Nitrofurantoin MIC <=16 ug/mL: Susceptible   Piperacillin + Tazobactam MIC >=128 ug/mL: Resistant    Trimethoprim + Sulfamethoxazole MIC <=20 ug/mL: Susceptible  Comment:  Ceftriaxone: Updated CLSI breakpoints indicate an MIC >2 ug/mL is non-susceptible.[employer] MICs are based on one-time FDA recommendations at the time of test approval.CLSI breakpoints are reviewed annually and should be used for therapeutic decision  making.    Imaging/Diagnostic Tests:  Dg Knee 1-2 Views Right IMPRESSION: No focal bone abnormality. Soft tissue swelling. Electronically Signed   By: Kerby Moors M.D.   On: 07/25/2017 17:26   Dg Tibia/fibula Right IMPRESSION: No acute abnormality. Atherosclerosis. Electronically Signed   By: Inge Rise M.D.   On: 07/25/2017 17:28    Steve Rattler, DO 07/27/2017, 8:53 AM PGY-3, [place] Intern pager: [phone], text pages welcome

## 2017-07-27 NOTE — Evaluation (Signed)
Physical Therapy Evaluation Patient Details Name: Mitchell Garcia MRN: [id number] DOB: 1946-01-23 Today's Date: 07/27/2017   History of Present Illness  Pt is a 72 y/o male admitted secondary to L LE cellulitis. PMH including but not limited to ESRD, DM, CHF, CAD s/p NSTEMI, CVA.    Clinical Impression  Pt presented supine in bed with HOB elevated, awake and willing to participate in therapy session. Prior to admission, pt reported that he ambulated with use of SPC and was independent with ADLs. Pt lives in a single level house with seven steps to enter with his spouse who is able to provide 24/7 supervision/assistance upon d/c. Pt currently requires min A for bed mobility and mod-max A x2 for transfers with RW. Pt very limited at this time secondary to L LE pain. Pt would continue to benefit from skilled physical therapy services at this time while admitted and after d/c to address the below listed limitations in order to improve overall safety and independence with functional mobility.     Follow Up Recommendations SNF    Equipment Recommendations  None recommended by PT    Recommendations for Other Services       Precautions / Restrictions Precautions Precautions: Fall Restrictions Weight Bearing Restrictions: No      Mobility  Bed Mobility Overal bed mobility: Needs Assistance Bed Mobility: Supine to Sit     Supine to sit: Min assist     General bed mobility comments: increased time and effort, use of bed rails, min A to elevate trunk  Transfers Overall transfer level: Needs assistance Equipment used: Rolling walker (2 wheeled) Transfers: Sit to/from [facility] Sit to Stand: Max assist;+2 physical assistance;From elevated surface Stand pivot transfers: Mod assist;+2 physical assistance       General transfer comment: increased time and effort, cueing for technique and safe hand placement, heavy physical assistance required to power into standing  from EOB, assistance for stability and with RW management during pivotal movement to chair  Ambulation/Gait             General Gait Details: unable due to pain and fatigue  Stairs            Wheelchair Mobility    Modified Rankin (Stroke Patients Only)       Balance Overall balance assessment: Needs assistance Sitting-balance support: Feet supported Sitting balance-Leahy Scale: Fair     Standing balance support: During functional activity;Bilateral upper extremity supported Standing balance-Leahy Scale: Poor Standing balance comment: pt unable to tolerate any weight bearing through R LE and held in flexion off of floor                             Pertinent Vitals/Pain Pain Assessment: Faces Faces Pain Scale: Hurts even more Pain Location: R LE Pain Descriptors / Indicators: Sore;Grimacing;Guarding Pain Intervention(s): Monitored during session;Repositioned    Home Living Family/patient expects to be discharged to:: Private residence Living Arrangements: Spouse/significant other Available Help at Discharge: Family;Available 24 hours/day Type of Home: House Home Access: Stairs to enter Entrance Stairs-Rails: Psychiatric nurse of Steps: 7 Home Layout: One level Home Equipment: Cane - single point      Prior Function Level of Independence: Independent with assistive device(s)         Comments: ambulated with [facility]     Hand Dominance        Extremity/Trunk Assessment   Upper Extremity Assessment Upper Extremity Assessment: Defer to  OT evaluation    Lower Extremity Assessment Lower Extremity Assessment: Generalized weakness;RLE deficits/detail RLE Deficits / Details: pt with preference to maintain R LE with hip in flexion/abduction/ER and knee flexed. Pt with increased pain with attempted extension ROM of knee. Pt limited to lacking 45 degrees to neutral with knee extension RLE: Unable to fully assess due to pain RLE  Sensation: WNL RLE Coordination: decreased fine motor;decreased gross motor       Communication   Communication: HOH  Cognition Arousal/Alertness: Awake/alert Behavior During Therapy: Flat affect Overall Cognitive Status: Impaired/Different from baseline Area of Impairment: Following commands;Problem solving;Safety/judgement                       Following Commands: Follows one step commands consistently;Follows one step commands with increased time Safety/Judgement: Decreased awareness of deficits;Decreased awareness of safety   Problem Solving: Slow processing;Decreased initiation;Difficulty sequencing;Requires verbal cues        General Comments      Exercises     Assessment/Plan    PT Assessment Patient needs continued PT services  PT Problem List Decreased strength;Decreased range of motion;Decreased activity tolerance;Decreased balance;Decreased mobility;Decreased coordination;Decreased knowledge of use of DME;Decreased cognition;Decreased safety awareness;Decreased knowledge of precautions;Pain       PT Treatment Interventions DME instruction;Gait training;Stair training;Therapeutic activities;Functional mobility training;Therapeutic exercise;Balance training;Neuromuscular re-education;Cognitive remediation;Patient/family education    PT Goals (Current goals can be found in the Care Plan section)  Acute Rehab PT Goals Patient Stated Goal: decrease pain PT Goal Formulation: With patient Time For Goal Achievement: 08/10/17 Potential to Achieve Goals: Good    Frequency Min 3X/week   Barriers to discharge        Co-evaluation PT/OT/SLP Co-Evaluation/Treatment: Yes Reason for Co-Treatment: To address functional/ADL transfers;For patient/therapist safety PT goals addressed during session: Mobility/safety with mobility;Balance;Proper use of DME;Strengthening/ROM         AM-PAC PT "6 Clicks" Daily Activity  Outcome Measure Difficulty turning over in  bed (including adjusting bedclothes, sheets and blankets)?: A Lot Difficulty moving from lying on back to sitting on the side of the bed? : Unable Difficulty sitting down on and standing up from a chair with arms (e.g., wheelchair, bedside commode, etc,.)?: Unable Help needed moving to and from a bed to chair (including a wheelchair)?: A Lot Help needed walking in hospital room?: A Lot Help needed climbing 3-5 steps with a railing? : Total 6 Click Score: 9    End of Session Equipment Utilized During Treatment: Gait belt Activity Tolerance: Patient limited by pain;Patient limited by fatigue Patient left: in chair;with call bell/phone within reach Nurse Communication: Mobility status PT Visit Diagnosis: Other abnormalities of gait and mobility (R26.89);Pain Pain - Right/Left: Right Pain - part of body: Leg;Knee    Time: 5852-7782 PT Time Calculation (min) (ACUTE ONLY): 33 min   Charges:   PT Evaluation $PT Eval Moderate Complexity: 1 Mod     PT G Codes:        [place], PT, DPT [place] 07/27/2017, 9:56 AM

## 2017-07-27 NOTE — NC FL2 (Signed)
[place] LEVEL OF CARE SCREENING TOOL     IDENTIFICATION  Patient Name: Mitchell Garcia Birthdate: 23-Jun-1945 Sex: male Admission Date (Current Location): 07/25/2017  [facility] and [place] Number:  Herbalist and Address:  [facility] [place], [place] [address], [place], [place] [address]      Provider Number: [id number]  Attending Physician Name and Address:  Lind Covert, MD  Relative Name and Phone Number:  Osten Janek; wife; [phone]    Current Level of Care: Hospital Recommended Level of Care: [place] Prior Approval Number:    Date Approved/Denied:   PASRR Number: [id number]  Discharge Plan: SNF    Current Diagnoses: Patient Active Problem List   Diagnosis Date Noted  . Cellulitis 07/25/2017  . Syncope 07/07/2017  . Pressure injury of skin 07/07/2017  . ESRD on dialysis ([place]) 05/24/2017  . Acute blood loss anemia 05/18/2017  . Acute on chronic combined systolic and diastolic CHF (congestive heart failure) ([place]) 05/17/2017  . MI (myocardial infarction) ([place])   . Ischemic cardiomyopathy 10/15/2016  . CAD S/P percutaneous coronary angioplasty 10/15/2016  . Acute kidney injury superimposed on chronic kidney disease ([place]) 10/15/2016  . Uncontrolled diabetes mellitus ([place]) 10/15/2016  . Essential hypertension 10/15/2016  . Acute combined systolic (congestive) and diastolic (congestive) heart failure ([place])   . NSTEMI (non-ST elevated myocardial infarction) ([place]) 10/04/2016  . Acute on chronic diastolic heart failure ([place]) 01/27/2013  . Humerus head fracture 01/27/2013  . Altered mental state 01/25/2013  . Acute renal failure superimposed on chronic kidney disease ([place]) 01/25/2013  . Transaminitis 01/25/2013  . Leukocytosis 01/25/2013  . Anemia of chronic disease 01/25/2013  . Hyponatremia 01/25/2013  . Hyperglycemia 01/25/2013    Orientation RESPIRATION BLADDER Height & Weight     Time,  Self, Situation, Place  Normal Continent Weight: 213 lb 13.5 oz (97 kg) Height:  5\' 11"  (180.3 cm)  BEHAVIORAL SYMPTOMS/MOOD NEUROLOGICAL BOWEL NUTRITION STATUS      Continent Diet(see discharge summary)  AMBULATORY STATUS COMMUNICATION OF NEEDS Skin   Extensive Assist Verbally Skin abrasions, Other (Comment)(non pressure wound on R leg; abrasion on R lower leg; AV fistula on R forearm)                       Personal Care Assistance Level of Assistance  Bathing, Feeding, Dressing Bathing Assistance: Maximum assistance Feeding assistance: Independent Dressing Assistance: Limited assistance     Functional Limitations Info  Speech, Hearing, Sight Sight Info: Adequate Hearing Info: Adequate Speech Info: Adequate    SPECIAL CARE FACTORS FREQUENCY  PT (By licensed PT), OT (By licensed OT)     PT Frequency: 5x week OT Frequency: 5x week            Contractures Contractures Info: Not present    Additional Factors Info  Code Status, Allergies Code Status Info: Full Code Allergies Info: OXYCODONE, TRAMADOL, LISINOPRIL, LOSARTAN            Current Medications (07/27/2017):  This is the current hospital active medication list Current Facility-Administered Medications  Medication Dose Route Frequency Provider Last Rate Last Dose  . acetaminophen (TYLENOL) tablet 650 mg  650 mg Oral Q6H Riccio, Angela C, DO   650 mg at 07/27/17 0806  . ALPRAZolam Duanne Moron) tablet 0.5 mg  0.5 mg Oral QHS PRN Steve Rattler, DO      . aspirin EC tablet 81 mg  81 mg Oral Daily Riccio,  Angela C, DO   81 mg at 07/27/17 0907  . atorvastatin (LIPITOR) tablet 80 mg  80 mg Oral QHS Riccio, Angela C, DO   80 mg at 07/26/17 2054  . carvedilol (COREG) tablet 3.125 mg  3.125 mg Oral BID WC Riccio, Angela C, DO   3.125 mg at 07/26/17 1832  . Chlorhexidine Gluconate Cloth 2 % PADS 6 each  6 each Topical Q0600 Donato Heinz, MD   6 each at 07/27/17 [phone]  . cinacalcet (SENSIPAR) tablet 30 mg  30 mg Oral  Q supper Donato Heinz, MD   30 mg at 07/26/17 1832  . clopidogrel (PLAVIX) tablet 75 mg  75 mg Oral Daily Lucila Maine C, DO   75 mg at 07/27/17 0907  . Darbepoetin Alfa (ARANESP) injection 60 mcg  60 mcg Intravenous Q Willow Ora, MD   60 mcg at 07/26/17 1431  . gabapentin (NEURONTIN) capsule 300 mg  300 mg Oral QHS Riccio, Angela C, DO   300 mg at 07/26/17 2055  . heparin injection 5,000 Units  5,000 Units Subcutaneous Q8H Riccio, Angela C, DO      . insulin aspart (novoLOG) injection 0-15 Units  0-15 Units Subcutaneous TID WC Riccio, Angela C, DO   5 Units at 07/27/17 0910  . insulin aspart (novoLOG) injection 0-5 Units  0-5 Units Subcutaneous QHS Lucila Maine C, DO   2 Units at 07/26/17 2200  . isosorbide-hydrALAZINE (BIDIL) 20-37.5 MG per tablet 1 tablet  1 tablet Oral BID Lucila Maine C, DO   1 tablet at 07/27/17 0908  . multivitamin (RENA-VIT) tablet 1 tablet  1 tablet Oral QHS Riccio, Angela C, DO   1 tablet at 07/26/17 2054  . nitroGLYCERIN (NITROSTAT) SL tablet 0.4 mg  0.4 mg Sublingual Q5 Min x 3 PRN Riccio, Angela C, DO      . primidone (MYSOLINE) tablet 100 mg  100 mg Oral Daily Riccio, Angela C, DO   100 mg at 07/27/17 0907  . sevelamer carbonate (RENVELA) tablet 2,400 mg  2,400 mg Oral TID WC Donato Heinz, MD   2,400 mg at 07/27/17 0807  . traMADol (ULTRAM) tablet 100 mg  100 mg Oral Q6H PRN Lucila Maine C, DO   100 mg at 07/27/17 0906  . vancomycin (VANCOCIN) IVPB 1000 mg/200 mL premix  1,000 mg Intravenous Q T,Th,Sa-HD Chambliss, Marshall L, MD      . vitamin B-12 (CYANOCOBALAMIN) tablet 1,000 mcg  1,000 mcg Oral Daily Lucila Maine C, DO   1,000 mcg at 07/27/17 0907     Discharge Medications: Please see discharge summary for a list of discharge medications.  Relevant Imaging Results:  Relevant Lab Results:   Additional Information SS# [id number]; HD TTS at [facility] in [facility], [place]

## 2017-07-27 NOTE — Clinical Social Work Note (Signed)
Clinical Social Work Assessment  Patient Details  Name: Mitchell Garcia MRN: [id number] Date of Birth: 07-26-1945  Date of referral:  07/27/17               Reason for consult:  Facility Placement, Discharge Planning                Permission sought to share information with:  Facility Sport and exercise psychologist, Family Supports Permission granted to share information::  Yes, Verbal Permission Granted  Name::      Mitchell Garcia  Agency::   SNFs that transport to HD at [facility] in [place]  Relationship::   wife  Contact Information:   [phone]  Housing/Transportation Living arrangements for the past 2 months:  Single Family Home Source of Information:  Patient, Spouse Patient Interpreter Needed:  None Criminal Activity/Legal Involvement Pertinent to Current Situation/Hospitalization:  No - Comment as needed Significant Relationships:  Spouse, Other(Comment)([facility] services) Lives with:  Spouse; Grandchildren; Other family members Do you feel safe going back to the place where you live?  Yes Need for family participation in patient care:  Yes (Comment)  Care giving concerns:  Pt has wound and is requiring more assistance than can be provided at home- requiring assistance with ADLs and IADLs.     Social Worker assessment / plan:  CSW met with pt and pt wife in room. Pt wife provided most answers during assessment, however pt did nod a few times when asked if he had ever been to SNF before.  Pt had been to [facility] in [place] in 2015, would prefer there or a facility closer to [place] in order to continue dialysis.   Pt wife states that they live with a granddaughter and great grandchildren but they are unable to provide pt or pt wife with any significant support. Pt also has another granddaughter next door, but again would prefer SNF level therapies.   CSW will send out referral and f/u with facilities that are able to transport to dialysis at [facility].   Employment  status:  Retired Nurse, adult, [employer] Benefit PT Recommendations:  [facility] [place] / Referral to community resources:  [place]  Patient/Family's Response to care:  Pt did not interact significantly with CSW when in the room; pt wife amenable to visit, understands referral system and that pt requires assistance with wound and PT/OT.   Patient/Family's Understanding of and Emotional Response to Diagnosis, Current Treatment, and Prognosis:  Pt again not interactive with CSW so it was difficult to assess level of understanding. Per RN report pt is A&O x4, but quiet. Pt wife expresses understanding of diagnosis, current treatment and prognosis. Pt wife seems knowledgeable regarding pt history and expresses understanding of the limitations she has to assist pt at current time. Pt wife appears happy with care at hospital.   Emotional Assessment Appearance:  Appears stated age Attitude/Demeanor/Rapport:   Quiet; Minimally Interactive Affect (typically observed):   Quiet; Accepting Orientation:  Oriented to Self, Oriented to Place, Oriented to  Time, Oriented to Situation Alcohol / Substance use:  Not Applicable Psych involvement (Current and /or in the community):  No (Comment)  Discharge Needs  Concerns to be addressed:  Care Coordination, Discharge Planning Concerns Readmission within the last 30 days:  Yes Current discharge risk:  Dependent with Mobility, Physical Impairment Barriers to Discharge:  Ship broker, Continued Medical Work up   [employer], [place] 07/27/2017, 11:46 AM

## 2017-07-27 NOTE — Evaluation (Signed)
Occupational Therapy Evaluation Patient Details Name: Mitchell Garcia MRN: [id number] DOB: 11-15-1945 Today's Date: 07/27/2017    History of Present Illness Pt is a 72 y/o male admitted secondary to L LE cellulitis. PMH including but not limited to ESRD, DM, CHF, CAD s/p NSTEMI, CVA.   Clinical Impression   Pt walks with a cane is able to perform ADL independently typically. He is anxious about falling and requires maximum encouragement for all mobility. Pt is not able to place weight on his R LE due to pain. He requires 2 person assist to stand and pivot and set up to total assist for ADL. Pt will likely need post acute rehab in SNF prior to return home. Will follow acutely.    Follow Up Recommendations  SNF;Supervision/Assistance - 24 hour    Equipment Recommendations       Recommendations for Other Services       Precautions / Restrictions Precautions Precautions: Fall Restrictions Weight Bearing Restrictions: No      Mobility Bed Mobility Overal bed mobility: Needs Assistance Bed Mobility: Supine to Sit     Supine to sit: Min assist     General bed mobility comments: increased time and effort, use of bed rails, min A to elevate trunk  Transfers Overall transfer level: Needs assistance Equipment used: Rolling walker (2 wheeled) Transfers: Sit to/from [facility] Sit to Stand: +2 physical assistance;Max assist Stand pivot transfers: +2 physical assistance;Mod assist       General transfer comment: increased time and effort, cueing for technique and safe hand placement, heavy physical assistance required to power into standing from EOB, assistance for stability and with RW management during pivotal movement to chair    Balance Overall balance assessment: Needs assistance Sitting-balance support: Feet supported Sitting balance-Leahy Scale: Fair     Standing balance support: During functional activity;Bilateral upper extremity supported Standing  balance-Leahy Scale: Poor Standing balance comment: pt unable to tolerate any weight bearing through R LE and held in flexion off of floor                           ADL either performed or assessed with clinical judgement   ADL Overall ADL's : Needs assistance/impaired Eating/Feeding: Independent;Sitting   Grooming: Wash/dry hands;Wash/dry face;Supervision/safety;Sitting   Upper Body Bathing: Moderate assistance;Sitting   Lower Body Bathing: Total assistance;Sit to/from stand;+2 for physical assistance   Upper Body Dressing : Minimal assistance;Sitting   Lower Body Dressing: Total assistance;Sit to/from stand;+2 for physical assistance   Toilet Transfer: +2 for physical assistance;Stand-pivot;RW;BSC;Moderate assistance   Toileting- Clothing Manipulation and Hygiene: Total assistance;+2 for physical assistance;Sit to/from stand       Functional mobility during ADLs: (unable to ambulat)       Vision Baseline Vision/History: Wears glasses Wears Glasses: At all times Patient Visual Report: No change from baseline       Perception     Praxis      Pertinent Vitals/Pain Pain Assessment: 0-10 Pain Score: 8  Faces Pain Scale: Hurts even more Pain Location: R LE Pain Descriptors / Indicators: Sore;Grimacing;Guarding Pain Intervention(s): Monitored during session;Repositioned     Hand Dominance Right   Extremity/Trunk Assessment Upper Extremity Assessment Upper Extremity Assessment: RUE deficits/detail;LUE deficits/detail RUE Deficits / Details: longstanding shoulder limitations, pt reports is inoperable due to his premorbid conditions RUE Coordination: decreased gross motor LUE Deficits / Details: generalized weakness   Lower Extremity Assessment Lower Extremity Assessment: Defer to PT evaluation RLE  Deficits / Details: pt with preference to maintain R LE with hip in flexion/abduction/ER and knee flexed. Pt with increased pain with attempted extension ROM  of knee. Pt limited to lacking 45 degrees to neutral with knee extension RLE: Unable to fully assess due to pain RLE Sensation: WNL RLE Coordination: decreased fine motor;decreased gross motor   Cervical / Trunk Assessment Cervical / Trunk Assessment: Normal   Communication Communication Communication: HOH   Cognition Arousal/Alertness: Awake/alert Behavior During Therapy: Flat affect Overall Cognitive Status: Impaired/Different from baseline Area of Impairment: Following commands;Problem solving;Safety/judgement                       Following Commands: Follows one step commands consistently;Follows one step commands with increased time Safety/Judgement: Decreased awareness of deficits;Decreased awareness of safety   Problem Solving: Slow processing;Decreased initiation;Difficulty sequencing;Requires verbal cues     General Comments       Exercises     Shoulder Instructions      Home Living Family/patient expects to be discharged to:: Private residence Living Arrangements: Spouse/significant other Available Help at Discharge: Family;Available 24 hours/day Type of Home: House Home Access: Stairs to enter [employer] of Steps: 7 Entrance Stairs-Rails: Right;Left Home Layout: One level     Bathroom Shower/Tub: Occupational psychologist: Handicapped height     Home Equipment: [place] - single point          Prior Functioning/Environment Level of Independence: Independent with assistive device(s)        Comments: ambulated with SPC        OT Problem List: Decreased strength;Decreased activity tolerance;Impaired balance (sitting and/or standing);Decreased cognition;Decreased safety awareness;Decreased knowledge of use of DME or AE;Pain;Impaired UE functional use      OT Treatment/Interventions: Self-care/ADL training;DME and/or AE instruction;Therapeutic activities;Patient/family education;Balance training    OT Goals(Current goals  can be found in the care plan section) Acute Rehab OT Goals Patient Stated Goal: decrease pain OT Goal Formulation: With patient Time For Goal Achievement: 08/10/17 Potential to Achieve Goals: Good ADL Goals Pt Will Perform Upper Body Bathing: with set-up;sitting Pt Will Perform Lower Body Bathing: with mod assist;with adaptive equipment;sit to/from stand Pt Will Perform Upper Body Dressing: with set-up;sitting Pt Will Perform Lower Body Dressing: with mod assist;sit to/from stand;with adaptive equipment Pt Will Transfer to Toilet: with min assist;ambulating;bedside commode Pt Will Perform Toileting - Clothing Manipulation and hygiene: with mod assist;sit to/from stand Additional ADL Goal #1: Pt will perform bed mobility with supervision in preparation for ADL.  OT Frequency: Min 2X/week   Barriers to D/C:            Co-evaluation PT/OT/SLP Co-Evaluation/Treatment: Yes Reason for Co-Treatment: For patient/therapist safety PT goals addressed during session: Mobility/safety with mobility;Balance;Proper use of DME;Strengthening/ROM OT goals addressed during session: ADL's and self-care      AM-PAC PT "6 Clicks" Daily Activity     Outcome Measure Help from another person eating meals?: None Help from another person taking care of personal grooming?: A Little Help from another person toileting, which includes using toliet, bedpan, or urinal?: Total Help from another person bathing (including washing, rinsing, drying)?: A Lot Help from another person to put on and taking off regular upper body clothing?: A Little Help from another person to put on and taking off regular lower body clothing?: Total 6 Click Score: 14   End of Session Equipment Utilized During Treatment: Gait belt;Rolling walker Nurse Communication: Mobility status  Activity Tolerance: Patient limited  by pain Patient left: in chair;with call bell/phone within reach  OT Visit Diagnosis: Unsteadiness on feet  (R26.81);Pain;Muscle weakness (generalized) (M62.81);Other symptoms and signs involving cognitive function Pain - Right/Left: Right Pain - part of body: Leg                Time: 0826-0906 OT Time Calculation (min): 40 min Charges:  OT General Charges $OT Visit: 1 Visit OT Evaluation $OT Eval Moderate Complexity: 1 Mod G-Codes:     Malka So 07/27/2017, 11:24 AM  07/27/2017 Nestor Lewandowsky, OTR/L Pager: [phone]

## 2017-07-27 NOTE — Progress Notes (Signed)
[facility] Progress Note    Subjective:   Looks better but says he feels "rough".  He was unable to stand or walk due to pain    Objective:   BP (!) 97/51 (BP Location: Left Arm)   Pulse 69   Temp 98.2 F (36.8 C) (Oral)   Resp 18   Ht 5\' 11"  (1.803 m)   Wt 97 kg (213 lb 13.5 oz)   SpO2 95%   BMI 29.83 kg/m   Intake/Output: I/O last 3 completed shifts: In: 595 [P.O.:595] Out: 2532 [Urine:30; Other:2502]   Intake/Output this shift:  Total I/O In: 396 [P.O.:396] Out: -  Weight change: 0.7 kg (1 lb 8.7 oz)  Physical Exam: Gen:NAD CVS: no rub Resp: cta Abd: benign Ext: RAVF +T/B, pads on knees bilaterally, left calf bandaged, + tenderness  Labs: BMET Recent Labs  Lab 07/25/17 1050 07/26/17 0607 07/27/17 0609  NA 131* 129* 129*  K 4.5 4.9 3.8  CL 92* 91* 93*  CO2 20* 18* 23  GLUCOSE 145* 133* 146*  BUN 83* 96* 42*  CREATININE 6.15* 6.83* 4.24*  ALBUMIN 3.0*  --  2.5*  CALCIUM 9.2 9.1 7.9*  PHOS  --  8.3* 5.5*   CBC Recent Labs  Lab 07/25/17 1050 07/26/17 0607  WBC 18.0* 17.5*  NEUTROABS 15.4*  --   HGB 10.3* 8.7*  HCT 30.2* 28.4*  MCV 94.7 94.7  PLT 300 276    @IMGRELPRIORS @ Medications:    . acetaminophen  650 mg Oral Q6H  . aspirin EC  81 mg Oral Daily  . atorvastatin  80 mg Oral QHS  . carvedilol  3.125 mg Oral BID WC  . Chlorhexidine Gluconate Cloth  6 each Topical Q0600  . cinacalcet  30 mg Oral Q supper  . clopidogrel  75 mg Oral Daily  . darbepoetin (ARANESP) injection - DIALYSIS  60 mcg Intravenous Q Tue-HD  . gabapentin  300 mg Oral QHS  . heparin  5,000 Units Subcutaneous Q8H  . insulin aspart  0-15 Units Subcutaneous TID WC  . insulin aspart  0-5 Units Subcutaneous QHS  . isosorbide-hydrALAZINE  1 tablet Oral BID  . multivitamin  1 tablet Oral QHS  . primidone  100 mg Oral Daily  . sevelamer carbonate  2,400 mg Oral TID WC  . vitamin B-12  1,000 mcg Oral Daily   Outpatient HD Prescription Valentino Nose [phone] Extension [id number] Charge RN) TTS 3K2.5 Ca 138Na 3hrs 45 min 400/800 37 temp EDW 97 kg (usually gets there) "Prn challenge by 0.5 as tolerated" TDC 1.6 ml hep dwell  3000 bolus heparin, no maintenance 2 mcg Hectorol 2 mcg TTS Ferrlecit 125 mg once a week No ESA   Assessment/ Plan:   1.  Right leg ulceration and surrounding cellulitis - was suspicious for calciphylaxis when he was seen on 07/07/17 by Dr. Lorrene Reid, which was a little unusual as he was new to HD, however he does have an elevated phos and iPTH.  His vitamin D was stopped during his last admission but it appears to still be on his medication list.   1. Recommend surgery consult for deep tissue biopsy to confirm dx of calciphylaxis.   2. have increased renvela to 2.4 grams per meal and added sensipar.   3. Discontinued calcitriol and ergocalciferol.   4. Will also use lower calcium bath.   5. Will hold off on sodium thiosulfate for now and continue with abx and local wound care.  6.  Vascular studies reveal non-compressible arteries and arterial wall calcification so the pathogenesis of tissue injury from calciphylaxis is similar but involving smaller vessels.  These ulcers could be related to his vasculopathy and treatment would be similar, local wound care and phos and lipid control.   2.  ESRD -  Continue with HD q TTS.  Cont on schedule 3.  Hypertension/volume  - stable 4.  Anemia  - was not on aranesp will start and follow H/H and iron stores 5.  Metabolic bone disease -  better phos control after increasing renvela, adding sensipar, and stopping vitamin D.  His ulceration is suspicious for calciphylaxis but also has known vascular calcifications of the large arteries of his legs. 6.  Nutrition - cont with renal diet, carbohydrate modified 7. Vascular access- RAVF maturing, RIJ TDC functioning well. 8. Disposition- will need SNF placement due to severe deconditioning and inability to stand/walk.   Donetta Potts, MD [place] Pager [phone] 07/27/2017, 11:43 AM

## 2017-07-28 LAB — CBC
HEMATOCRIT: 27.9 % — AB (ref 39.0–52.0)
HEMOGLOBIN: 8.5 g/dL — AB (ref 13.0–17.0)
MCH: 28.6 pg (ref 26.0–34.0)
MCHC: 30.5 g/dL (ref 30.0–36.0)
MCV: 93.9 fL (ref 78.0–100.0)
Platelets: 287 10*3/uL (ref 150–400)
RBC: 2.97 MIL/uL — ABNORMAL LOW (ref 4.22–5.81)
RDW: 15.4 % (ref 11.5–15.5)
WBC: 13.9 10*3/uL — AB (ref 4.0–10.5)

## 2017-07-28 LAB — RENAL FUNCTION PANEL
ALBUMIN: 2.5 g/dL — AB (ref 3.5–5.0)
ANION GAP: 16 — AB (ref 5–15)
BUN: 66 mg/dL — ABNORMAL HIGH (ref 8–23)
CO2: 23 mmol/L (ref 22–32)
Calcium: 7.9 mg/dL — ABNORMAL LOW (ref 8.9–10.3)
Chloride: 86 mmol/L — ABNORMAL LOW (ref 98–111)
Creatinine, Ser: 6.14 mg/dL — ABNORMAL HIGH (ref 0.61–1.24)
GFR, EST AFRICAN AMERICAN: 9 mL/min — AB (ref >60)
GFR, EST NON AFRICAN AMERICAN: 8 mL/min — AB (ref >60)
Glucose, Bld: 172 mg/dL — ABNORMAL HIGH (ref 70–99)
PHOSPHORUS: 6.8 mg/dL — AB (ref 2.5–4.6)
POTASSIUM: 4.4 mmol/L (ref 3.5–5.1)
Sodium: 125 mmol/L — ABNORMAL LOW (ref 135–145)

## 2017-07-28 LAB — PARATHYROID HORMONE, INTACT (NO CA): PTH: 78 pg/mL — ABNORMAL HIGH (ref 15–65)

## 2017-07-28 LAB — VITAMIN D 25 HYDROXY (VIT D DEFICIENCY, FRACTURES): Vit D, 25-Hydroxy: 21.7 ng/mL — ABNORMAL LOW (ref 30.0–100.0)

## 2017-07-28 LAB — GLUCOSE, CAPILLARY
GLUCOSE-CAPILLARY: 145 mg/dL — AB (ref 70–99)
Glucose-Capillary: 137 mg/dL — ABNORMAL HIGH (ref 70–99)
Glucose-Capillary: 199 mg/dL — ABNORMAL HIGH (ref 70–99)

## 2017-07-28 MED ORDER — HEPARIN SODIUM (PORCINE) 1000 UNIT/ML DIALYSIS
1000.0000 [IU] | INTRAMUSCULAR | Status: DC | PRN
  Administered 2017-07-28: 1000 [IU] via INTRAVENOUS_CENTRAL
  Filled 2017-07-28 (×2): qty 1

## 2017-07-28 MED ORDER — VANCOMYCIN HCL IN DEXTROSE 1-5 GM/200ML-% IV SOLN
INTRAVENOUS | Status: AC
  Administered 2017-07-28: 1000 mg via INTRAVENOUS
  Filled 2017-07-28: qty 200

## 2017-07-28 MED ORDER — LIDOCAINE HCL (PF) 2 % IJ SOLN
0.0000 mL | Freq: Once | INTRAMUSCULAR | Status: DC | PRN
  Filled 2017-07-28 (×2): qty 20

## 2017-07-28 MED ORDER — HEPARIN SODIUM (PORCINE) 1000 UNIT/ML DIALYSIS
20.0000 [IU]/kg | INTRAMUSCULAR | Status: DC | PRN
  Administered 2017-07-28: 1900 [IU] via INTRAVENOUS_CENTRAL
  Filled 2017-07-28 (×2): qty 2

## 2017-07-28 NOTE — Progress Notes (Signed)
S:Still complaining of knee pain R>L O:BP 119/63 (BP Location: Left Arm)   Pulse 61   Temp 97.9 F (36.6 C) (Oral)   Resp 17   Ht 5\' 11"  (1.803 m)   Wt 97.7 kg (215 lb 6.2 oz)   SpO2 96%   BMI 30.04 kg/m   Intake/Output Summary (Last 24 hours) at 07/28/2017 0944 Last data filed at 07/28/2017 0445 Gross per 24 hour  Intake 297 ml  Output -  Net 297 ml   Intake/Output: I/O last 3 completed shifts: In: 693 [P.O.:693] Out: 30 [Urine:30]  Intake/Output this shift:  No intake/output data recorded. Weight change: -1.6 kg (-3 lb 8.4 oz) Gen: NAD CVS: no rub Resp: occ rhonchi Abd: benign Ext: ulceration of right calf and knees wrapped.  No edema, R FA AVF +T/B  Recent Labs  Lab 07/25/17 1050 07/26/17 0607 07/27/17 0609  NA 131* 129* 129*  K 4.5 4.9 3.8  CL 92* 91* 93*  CO2 20* 18* 23  GLUCOSE 145* 133* 146*  BUN 83* 96* 42*  CREATININE 6.15* 6.83* 4.24*  ALBUMIN 3.0*  --  2.5*  CALCIUM 9.2 9.1 7.9*  PHOS  --  8.3* 5.5*  AST 21  --   --   ALT 22  --   --    Liver Function Tests: Recent Labs  Lab 07/25/17 1050 07/27/17 0609  AST 21  --   ALT 22  --   ALKPHOS 76  --   BILITOT 0.5  --   PROT 6.7  --   ALBUMIN 3.0* 2.5*   No results for input(s): LIPASE, AMYLASE in the last 168 hours. No results for input(s): AMMONIA in the last 168 hours. CBC: Recent Labs  Lab 07/25/17 1050 07/26/17 0607  WBC 18.0* 17.5*  NEUTROABS 15.4*  --   HGB 10.3* 8.7*  HCT 30.2* 28.4*  MCV 94.7 94.7  PLT 300 276   Cardiac Enzymes: No results for input(s): CKTOTAL, CKMB, CKMBINDEX, TROPONINI in the last 168 hours. CBG: Recent Labs  Lab 07/27/17 0812 07/27/17 1223 07/27/17 1701 07/27/17 2108 07/28/17 0810  GLUCAP 222* 218* 164* 190* 137*    Iron Studies: No results for input(s): IRON, TIBC, TRANSFERRIN, FERRITIN in the last 72 hours. Studies/Results: No results found. [name] acetaminophen  650 mg Oral Q6H  . aspirin EC  81 mg Oral Daily  . atorvastatin  80 mg Oral QHS  .  carvedilol  3.125 mg Oral BID WC  . Chlorhexidine Gluconate Cloth  6 each Topical Q0600  . cinacalcet  30 mg Oral Q supper  . clopidogrel  75 mg Oral Daily  . darbepoetin (ARANESP) injection - DIALYSIS  60 mcg Intravenous Q Tue-HD  . gabapentin  300 mg Oral QHS  . heparin  5,000 Units Subcutaneous Q8H  . insulin aspart  0-15 Units Subcutaneous TID WC  . insulin aspart  0-5 Units Subcutaneous QHS  . isosorbide-hydrALAZINE  1 tablet Oral BID  . multivitamin  1 tablet Oral QHS  . primidone  100 mg Oral Daily  . sevelamer carbonate  2,400 mg Oral TID WC  . vitamin B-12  1,000 mcg Oral Daily    BMET    Component Value Date/Time   NA 129 (L) 07/27/2017 0609   NA 144 11/01/2016 1532   K 3.8 07/27/2017 0609   CL 93 (L) 07/27/2017 0609   CO2 23 07/27/2017 0609   GLUCOSE 146 (H) 07/27/2017 0609   BUN 42 (H) 07/27/2017 0609   BUN  66 (H) 11/01/2016 1532   CREATININE 4.24 (H) 07/27/2017 0609   CALCIUM 7.9 (L) 07/27/2017 0609   CALCIUM 8.2 (L) 05/19/2017 0230   GFRNONAA 13 (L) 07/27/2017 0609   GFRAA 15 (L) 07/27/2017 0609   CBC    Component Value Date/Time   WBC 17.5 (H) 07/26/2017 0607   RBC 3.00 (L) 07/26/2017 0607   HGB 8.7 (L) 07/26/2017 0607   HGB 8.7 (L) 11/01/2016 1532   HCT 28.4 (L) 07/26/2017 0607   HCT 26.6 (L) 11/01/2016 1532   PLT 276 07/26/2017 0607   PLT 328 11/01/2016 1532   MCV 94.7 07/26/2017 0607   MCV 92 11/01/2016 1532   MCH 29.0 07/26/2017 0607   MCHC 30.6 07/26/2017 0607   RDW 15.5 07/26/2017 0607   RDW 15.3 11/01/2016 1532   LYMPHSABS 0.8 07/25/2017 1050   MONOABS 1.6 (H) 07/25/2017 1050   EOSABS 0.0 07/25/2017 1050   BASOSABS 0.0 07/25/2017 1050    Outpatient HD Prescription Valentino Nose ([phone] Extension [phone] Charge RN) TTS 3K2.5 Ca 138Na 3hrs 45 min 400/800 37 temp EDW 97 kg (usually gets there) "Prn challenge by 0.5 as tolerated" TDC 1.6 ml hep dwell  3000 bolus heparin, no maintenance 2 mcg Hectorol 2 mcg TTS (STOPPED DUE  TO CONCERNS OF CALCIPHYLAXIS THIS ADMISSION) Ferrlecit 125 mg once a week No ESA   Assessment/ Plan:   1. Right leg ulceration and surrounding cellulitis- was suspicious for calciphylaxis when he was seen on 07/07/17 by Dr. Lorrene Reid, which was a little unusual as he was new to HD, however he does have an elevated phos and iPTH. His vitamin D was stopped during his last admission but it appears to still be on his medication list.  1. Recommend surgery consult for deep tissue biopsy to confirm dx of calciphylaxis.  2. have increased renvela to 2.4 grams per meal and added sensipar.   3. Discontinued calcitriol and ergocalciferol.  4. Will also use lower calcium bath.  5. Will hold off on sodium thiosulfate for now and continue with abx and local wound care.  6. Vascular studies reveal non-compressible arteries and arterial wall calcification so the pathogenesis of tissue injury from calciphylaxis is similar but involving smaller vessels.  These ulcers could be related to his vasculopathy and treatment would be similar, local wound care and phos and lipid control.   2. ESRD- Continue with HD q TTS.  Cont on schedule 3. Hypertension/volume- stable 4. Anemia- was not on aranesp will start and follow H/H and iron stores 5. Metabolic bone disease- better phos control after increasing renvela, adding sensipar, and stopping vitamin D.  His ulceration is suspicious for calciphylaxis but also has known vascular calcifications of the large arteries of his legs. 6. Nutrition- cont with renal diet, carbohydrate modified 7. Vascular access- RAVF maturing, RIJ TDC functioning well. 8. Disposition- will need SNF placement due to severe deconditioning and inability to stand/walk.   Donetta Potts, MD [employer] [phone]

## 2017-07-28 NOTE — Progress Notes (Signed)
HD tx initiated via HD cath w/o problem, bilat ports: pull/push/flush equally w/o problem, VSS, will cont to monitor while on HD tx 

## 2017-07-28 NOTE — Progress Notes (Addendum)
FMTS Attending Daily Note:  Mitchell Netters MD Personal pager:  [phone] FPTS Service Pager:  [phone]  I have seen and examined this patient and have reviewed their chart. I have discussed this patient with the resident. I agree with the resident's findings, assessment and care plan.  No effusion or joint warmth of R knee on my exam. Able to bend and straighten some, though limited by cellulitis related pain. Monitor cellulitis to ensure it is improving on vanc. Appreciate gen surg assistance with tissue biopsy.   Leeanne Rio, MD 07/28/2017   ---------------------------------------  Family Medicine Teaching Service Daily Progress Note Intern Pager: [phone]  Patient name: Mitchell Garcia Medical record number: [id number] Date of birth: 03/05/1945 Age: 72 y.o. Gender: male  Primary Care Provider: [name], MD Consultants: nephrology, wound care Code Status: full  Pt Overview and Major Events to Date:  7/1- admitted to [facility] w/ nonhealing RIGHT leg wound 7/2- HD (TTS)  Assessment and Plan: Mitchell Garcia is a 71 y.o. male presenting with cellulitis of leg . 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 ABI's with low normal flow to right leg. Patient with many risk factors for poor wound healing. - Vitals per floor. Pulse Ox with vitals. - continue IV Vancomycin w/ HD per pharmacy, switch to PO antibiotics once leg improved - wound care recommending daily dressing changes with aquacel ag moistened with NS covered w/ 4x4 and kerlix -medications to control phosphate increased by nephro -PTH, vit D levels pending -blood cx NGTD - Pain control with Tramadol 100mg  Q6h prn -scheduled tylenol - monitor WBC trend - Consulted surgery, planning for punch biopsy  7/5  Klebsiella UTI- noted on outpatient records from 6/28, patient reports he makes very little urine, currently denies burning with urination. This does not appear to have been treated. -repeat urinalysis  ESRD on HD - TTS schedule. - Nephrology following for HD - Avoid nephrotoxic agents. - Follow electrolytes daily - Strict I/O's - continue home renal meds - Darbepoetin 60 mcg started (7/4)  Poorly controlled T2DM - Chronic.  Last A1c 7.6 in 09/2016. AM CBG 222 - mSSI with evening coverage - can add long acting insulin as needed  - Diabetic/renal diet  Anemia of chronic disease - Stable. baseline ~8 -continue to monitor  Combined HF - Chronic.  Currently asymptomatic aside from bilateral LE edema.  Most recent Echo 07/09/17 40-45%. - Strict I/O's - Continue to monitor. - continue home HF medications  CAD with history of NSTEMI - Chronic. S/P Stent x2 per record.Left Heart Cath 9/14 significant for multivessel disease with proximal LAD stenosis of 70%, total occlusion of mid LCx, and 95% of RCA stenosis.  On statin, Nitro, Coreg. - Continue ASA and Plavix - continue home meds   FEN/GI: renal/carb modified diet Prophylaxis: on plavix, heparin subq  Disposition: continued inpatient care, dispo pending PT/OT and cellulitis resolution  Subjective:  Spoke with pt this am.  Pt still bothered by pain in right lower leg.  No new complaints this am.  Informed pt about surgical biopsy of wound.    Objective: Temp:  [97.8 F (36.6 C)-98.5 F (36.9 C)] 97.9 F (36.6 C) (07/04 0445) Pulse Rate:  [57-69] 61 (07/04 0445) Resp:  [14-17] 17 (07/04 0445) BP: (97-119)/(51-66) 119/63 (07/04 0445) SpO2:  [93 %-  96 %] 96 % (07/04 0445) Weight:  [97.7 kg (215 lb 6.2 oz)] 97.7 kg (215 lb 6.2 oz) (07/04 0500) Physical Exam: Physical Exam  Constitutional: He appears well-developed.  HENT:  Head: Normocephalic and atraumatic.  Eyes: Pupils are equal, round, and reactive to light. EOM  are normal.  Neck: Normal range of motion.  Cardiovascular: Normal rate and regular rhythm.  Pulmonary/Chest: Effort normal and breath sounds normal. Respiratory distress: breath sounds heard on lateral chest as it was difficult for pt to sit up.  Abdominal: Soft. Bowel sounds are normal. He exhibits no distension. There is no tenderness.  Musculoskeletal: He exhibits edema (edema maily in right lower extremity.  redness appears to be decreaseing around right lower leg would.  small spotted areas of erythema still remain.  Pt was in significant pain when his knee was bent to observe woulnd.).  Neurological: He is alert.  Skin: Skin is warm.  Psychiatric: He has a normal mood and affect. His behavior is normal.       Laboratory: Recent Labs  Lab 07/25/17 1050 07/26/17 0607  WBC 18.0* 17.5*  HGB 10.3* 8.7*  HCT 30.2* 28.4*  PLT 300 276   Recent Labs  Lab 07/25/17 1050 07/26/17 0607 07/27/17 0609  NA 131* 129* 129*  K 4.5 4.9 3.8  CL 92* 91* 93*  CO2 20* 18* 23  BUN 83* 96* 42*  CREATININE 6.15* 6.83* 4.24*  CALCIUM 9.2 9.1 7.9*  PROT 6.7  --   --   BILITOT 0.5  --   --   ALKPHOS 76  --   --   ALT 22  --   --   AST 21  --   --   GLUCOSE 145* 133* 146*   CBG (last 3)  Recent Labs    07/27/17 1223 07/27/17 1701 07/27/17 2108  GLUCAP 218* 164* 190*    Results for orders placed or performed during the hospital encounter of 07/25/17  Blood culture (routine x 2)     Status: None (Preliminary result)   Collection Time: 07/25/17 12:30 PM  Result Value Ref Range Status   Specimen Description BLOOD LEFT ANTECUBITAL  Final   Special Requests   Final    BOTTLES DRAWN AEROBIC ONLY Blood Culture adequate volume   Culture   Final    NO GROWTH 2 DAYS Performed at [facility], [address], [place], [place] [address]    Report Status PENDING  Incomplete  Blood culture (routine x 2)     Status: None (Preliminary result)   Collection Time: 07/25/17 12:38 PM   Result Value Ref Range Status   Specimen Description BLOOD SITE NOT SPECIFIED  Final   Special Requests   Final    BOTTLES DRAWN AEROBIC ONLY Blood Culture results may not be optimal due to an inadequate volume of blood received in culture bottles   Culture   Final    NO GROWTH 2 DAYS Performed at [facility], [place] [address], [place], [place] [address]    Report Status PENDING  Incomplete    Culture, Urine Urine Urine, Clean CatchResulted: 07/25/2017 1:47 PM [facility] Component Name Value Ref Range  Urine Culture KLEBSIELLA OXYTOCA (A)   Specimen Collected on  Urine - Urine, Clean Catch 07/22/2017 5:38 PM   Organism Antibiotic Method Susceptibility  Unknown Organism Amoxicillin + Clavulanate MIC >=32 ug/mL: Resistant   Ampicillin MIC >=32 ug/mL: Resistant   Aztreonam MIC 16 ug/mL: Intermediate  Cefazolin MIC >=64 ug/mL: Resistant   Cefepime MIC <=1 ug/mL: Susceptible   Ceftriaxone MIC 4 ug/mL: Susceptible   Ciprofloxacin MIC <=0.25 ug/mL: Susceptible   Gentamicin MIC <=1 ug/mL: Susceptible   Nitrofurantoin MIC <=16 ug/mL: Susceptible   Piperacillin + Tazobactam MIC >=128 ug/mL: Resistant   Trimethoprim + Sulfamethoxazole MIC <=20 ug/mL: Susceptible  Comment:  Ceftriaxone: Updated CLSI breakpoints indicate an MIC >2 ug/mL is non-susceptible.[employer]itek MICs are based on one-time FDA recommendations at the time of test approval.CLSI breakpoints are reviewed annually and should be used for therapeutic decision  making.    Imaging/Diagnostic Tests:  Dg Knee 1-2 Views Right IMPRESSION: No focal bone abnormality. Soft tissue swelling. Electronically Signed   By: Kerby Moors M.D.   On: 07/25/2017 17:26   Dg Tibia/fibula Right IMPRESSION: No acute abnormality. Atherosclerosis. Electronically Signed   By: Inge Rise M.D.   On: 07/25/2017 17:28    Matilde Haymaker, MD 07/28/2017, 7:52 AM PGY-3, [place] Intern pager: [phone], text pages  welcome

## 2017-07-28 NOTE — Progress Notes (Signed)
HD tx completed w/o problem, UF goal met, blood rinsed back, VSS, report called to Marya Landry, RN

## 2017-07-28 NOTE — Progress Notes (Signed)
Pharmacy Antibiotic Note  Mitchell Garcia is a 72 y.o. male admitted on 07/25/2017 with cellulitis.  Pharmacy has been consulted for vancomycin dosing. Pt with history of ESRD on HD. He has been on vancomycin x 3 weeks PTA and  random vancomycin level on admission was therapeutic at 19. HD regimen remains T-Th-Sat with next HD today. WBC slowly trending down.   Plan: Continue Vancomycin 1gm post-HD TThS F/u renal plans, C&S, clinical status and vanc levels PRN Follow-up urine culture and potential need for GN coverage  Height: 5\' 11"  (180.3 cm) Weight: 215 lb 6.2 oz (97.7 kg) IBW/kg (Calculated) : 75.3  Temp (24hrs), Avg:98.1 F (36.7 C), Min:97.8 F (36.6 C), Max:98.5 F (36.9 C)  Recent Labs  Lab 07/25/17 1050 07/25/17 1112 07/25/17 1357 07/26/17 0607 07/27/17 0609  WBC 18.0*  --   --  17.5*  --   CREATININE 6.15*  --   --  6.83* 4.24*  LATICACIDVEN  --  1.18  --   --   --   VANCORANDOM  --   --  19  --   --     Estimated Creatinine Clearance: 18.5 mL/min (A) (by C-G formula based on SCr of 4.24 mg/dL (H)).    Allergies  Allergen Reactions  . Oxycodone Other (See Comments)    DIFFICULT TO WAKE UP  . Tramadol Nausea And Vomiting  . Lisinopril Other (See Comments)    Hyperkalemia   . Losartan Other (See Comments)    Hyperkalemia     Antimicrobials this admission: Vanc PTA>>  Dose adjustments this admission: 7/1 VR = 19  Microbiology results: 7/1 Blood >> 7/4 Urine >>  Thank you for allowing pharmacy to be a part of this patient's care.  Sloan Leiter, PharmD, BCPS, BCCCP Clinical Pharmacist Clinical phone 07/28/2017 until 3:30PM [phone] After hours, please call #[phone] 07/28/2017 9:18 AM

## 2017-07-28 NOTE — Consult Note (Signed)
[facility] Surgery Consult Note  Mitchell Garcia August 14, 1945  [id number].    Requesting MD: Donato Heinz Chief Complaint/Reason for Consult: chronic RLE ulcer  HPI:  Mitchell Garcia is a 72yo male PMH ESRD on HD and h/o CAD on Plavix, who is currently admitted to [facility] with cellulitis of his RLE. Patient was admitted on 7/1 and started on vancomycin. States that he has had a wound/ulcer on the posterior aspect of his RLE for about 6 weeks. Denies any trauma to the area. States that it has gradually gotten worse and more painful with surrounding erythema. States that he has had difficulty ambulating due to the pain. Pain is constant and severe with no alleviating factors. Denies fever or chills.  General surgery consulted for punch biopsy of the area to rule in/out calciphylaxis.  ROS: Review of Systems  Constitutional: Negative.   HENT: Negative.   Eyes: Negative.   Respiratory: Negative.   Cardiovascular: Positive for leg swelling.  Gastrointestinal: Negative.   Genitourinary: Negative.   Musculoskeletal: Positive for joint pain.  Skin: Positive for rash.  Neurological: Positive for weakness.    All systems reviewed and otherwise negative except for as above  Family History  Problem Relation Age of Onset  . Hyperlipidemia Father     Past Medical History:  Diagnosis Date  . Acute kidney injury superimposed on chronic kidney disease ([place]) 10/15/2016  . Altered mental state 01/25/2013  . Anemia of chronic disease 01/25/2013  . CAD S/P percutaneous coronary angioplasty    a.not to be a candidate for CABG. On 9/19, went back to cath lab for PCI s/p orbital atherectomy with DES to ostial RCA, and PTCA/DES to distal RCA. Residual 50% prox LAD, 70% D1, CTO mLCx noted by initial diagnostic cath 10/08/16 to be treated medically.  . Cancer ([place])   . Cellulitis 07/2017   right leg  . Cellulitis of right lower extremity 07/25/2017  . Chronic combined systolic and diastolic CHF  (congestive heart failure) ([place])   . CKD (chronic kidney disease), stage IV ([place])   . Diabetes mellitus with renal complications ([facility])    uncontrolled  . Essential hypertension 10/15/2016  . Humerus head fracture 01/27/2013  . Hyperlipidemia   . Hyponatremia 01/25/2013  . Insulin dependent diabetes mellitus ([place])   . Ischemic cardiomyopathy 10/15/2016  . MI (myocardial infarction) ([place])    years ago  . Stroke ([place])   . Transaminitis 01/25/2013  . Uncontrolled diabetes mellitus ([place]) 10/15/2016    Past Surgical History:  Procedure Laterality Date  . CORONARY STENT INTERVENTION N/A 10/13/2016   Procedure: CORONARY STENT INTERVENTION;  Surgeon: Burnell Blanks, MD;  Location: [facility];  Service: Cardiovascular;  Laterality: N/A;  . IR FLUORO GUIDE CV LINE RIGHT  05/19/2017  . IR US GUIDE VASC ACCESS RIGHT  05/19/2017  . LEFT HEART CATH AND CORONARY ANGIOGRAPHY N/A 10/08/2016   Procedure: LEFT HEART CATH AND CORONARY ANGIOGRAPHY;  Surgeon: Nelva Bush, MD;  Location: [facility];  Service: Cardiovascular;  Laterality: N/A;  . SKIN CANCER EXCISION    . TEMPORARY PACEMAKER N/A 10/13/2016   Procedure: Temporary Pacemaker;  Surgeon: Burnell Blanks, MD;  Location: [facility];  Service: Cardiovascular;  Laterality: N/A;    Social History:  reports that he has never smoked. He has never used smokeless tobacco. He reports that he does not drink alcohol or use drugs.  Allergies:  Allergies  Allergen Reactions  . Oxycodone Other (See Comments)  DIFFICULT TO WAKE UP  . Tramadol Nausea And Vomiting  . Lisinopril Other (See Comments)    Hyperkalemia   . Losartan Other (See Comments)    Hyperkalemia     Medications Prior to Admission  Medication Sig Dispense Refill  . ALPRAZolam (XANAX) 0.5 MG tablet Take 1 tablet (0.5 mg total) by mouth at bedtime as needed for anxiety. 30 tablet 0  . aspirin EC 81 MG tablet Take 81 mg by mouth daily.    [name]  atorvastatin (LIPITOR) 80 MG tablet Take 80 mg by mouth at bedtime.    . carvedilol (COREG) 3.125 MG tablet Take 1 tablet (3.125 mg total) by mouth 2 (two) times daily with a meal. 60 tablet 3  . cholecalciferol (VITAMIN D) 1000 units tablet Take 1,000 Units by mouth daily.    . clopidogrel (PLAVIX) 75 MG tablet Take 1 tablet (75 mg total) by mouth daily. 90 tablet 3  . docusate sodium 100 MG CAPS Take 100 mg by mouth 2 (two) times daily. 10 capsule 0  . gabapentin (NEURONTIN) 300 MG capsule Take 1 capsule (300 mg total) by mouth at bedtime. 30 capsule 1  . insulin aspart protamine- aspart (NOVOLOG MIX 70/30) (70-30) 100 UNIT/ML injection Inject 30-40 Units into the skin 2 (two) times daily with a meal.    . isosorbide-hydrALAZINE (BIDIL) 20-37.5 MG tablet Take 1 tablet by mouth 2 (two) times daily. 60 tablet 1  . multivitamin (RENA-VIT) TABS tablet Take 1 tablet by mouth at bedtime. 30 tablet 1  . nitroGLYCERIN (NITROSTAT) 0.4 MG SL tablet Place 1 tablet (0.4 mg total) under the tongue every 5 (five) minutes x 3 doses as needed for chest pain. 25 tablet 2  . primidone (MYSOLINE) 50 MG tablet Take 100 mg by mouth daily.    . sevelamer carbonate (RENVELA) 800 MG tablet Take 2 tablets (1,600 mg total) by mouth 3 (three) times daily with meals. 180 tablet 0  . traMADol (ULTRAM) 50 MG tablet Take 2 tablets (100 mg total) by mouth 2 (two) times daily. (Patient taking differently: Take 100 mg by mouth every 6 (six) hours as needed for moderate pain. ) 30 tablet 1  . vitamin B-12 (CYANOCOBALAMIN) 500 MCG tablet Take 1,000 mcg by mouth daily.    [name] acetaminophen (TYLENOL) 325 MG tablet Take 2 tablets (650 mg total) by mouth every 4 (four) hours as needed for mild pain, fever or headache. 10 tablet 1  . calcitRIOL (ROCALTROL) 0.5 MCG capsule Take 1 capsule (0.5 mcg total) by mouth daily. (Patient not taking: Reported on 07/25/2017) 30 capsule 1    Prior to Admission medications   Medication Sig Start Date End  Date Taking? Authorizing Provider  ALPRAZolam Duanne Moron) 0.5 MG tablet Take 1 tablet (0.5 mg total) by mouth at bedtime as needed for anxiety. 02/01/13  Yes Theodis Blaze, MD  aspirin EC 81 MG tablet Take 81 mg by mouth daily.   Yes [provider]  atorvastatin (LIPITOR) 80 MG tablet Take 80 mg by mouth at bedtime.   Yes [provider]  carvedilol (COREG) 3.125 MG tablet Take 1 tablet (3.125 mg total) by mouth 2 (two) times daily with a meal. 05/24/17  Yes Emokpae, Courage, MD  cholecalciferol (VITAMIN D) 1000 units tablet Take 1,000 Units by mouth daily.   Yes [provider]  clopidogrel (PLAVIX) 75 MG tablet Take 1 tablet (75 mg total) by mouth daily. 11/08/16  Yes Isaiah Serge, NP  docusate sodium 100  MG CAPS Take 100 mg by mouth 2 (two) times daily. 02/01/13  Yes Theodis Blaze, MD  gabapentin (NEURONTIN) 300 MG capsule Take 1 capsule (300 mg total) by mouth at bedtime. 05/24/17  Yes Emokpae, Courage, MD  insulin aspart protamine- aspart (NOVOLOG MIX 70/30) (70-30) 100 UNIT/ML injection Inject 30-40 Units into the skin 2 (two) times daily with a meal.   Yes [provider]  isosorbide-hydrALAZINE (BIDIL) 20-37.5 MG tablet Take 1 tablet by mouth 2 (two) times daily. 05/24/17  Yes Emokpae, Courage, MD  multivitamin (RENA-VIT) TABS tablet Take 1 tablet by mouth at bedtime. 05/24/17  Yes Emokpae, Courage, MD  nitroGLYCERIN (NITROSTAT) 0.4 MG SL tablet Place 1 tablet (0.4 mg total) under the tongue every 5 (five) minutes x 3 doses as needed for chest pain. 10/18/16  Yes Cheryln Manly, NP  primidone (MYSOLINE) 50 MG tablet Take 100 mg by mouth daily.   Yes [provider]  sevelamer carbonate (RENVELA) 800 MG tablet Take 2 tablets (1,600 mg total) by mouth 3 (three) times daily with meals. 07/10/17  Yes Diallo, Abdoulaye, MD  traMADol (ULTRAM) 50 MG tablet Take 2 tablets (100 mg total) by mouth 2 (two) times daily. Patient taking differently: Take 100 mg by  mouth every 6 (six) hours as needed for moderate pain.  02/01/13  Yes Theodis Blaze, MD  vitamin B-12 (CYANOCOBALAMIN) 500 MCG tablet Take 1,000 mcg by mouth daily.   Yes [provider]  acetaminophen (TYLENOL) 325 MG tablet Take 2 tablets (650 mg total) by mouth every 4 (four) hours as needed for mild pain, fever or headache. 05/24/17   Roxan Hockey, MD  calcitRIOL (ROCALTROL) 0.5 MCG capsule Take 1 capsule (0.5 mcg total) by mouth daily. Patient not taking: Reported on 07/25/2017 05/24/17   Roxan Hockey, MD    Blood pressure 119/63, pulse 61, temperature 97.9 F (36.6 C), temperature source Oral, resp. rate 17, height _0  (1.803 m), weight 215 lb 6.2 oz (97.7 kg), SpO2 96 %. Physical Exam: General: pleasant, WD/WN white male who is laying in bed in NAD HEENT: head is normocephalic, atraumatic.  Sclera are noninjected.  Pupils equal and round.  Ears and nose without any masses or lesions.  Mouth is pink and moist. Dentition fair Heart: regular, rate, and rhythm.  No obvious murmurs, gallops, or rubs noted.  Palpable pedal pulses bilaterally Lungs: CTAB, no wheezes, rhonchi, or rales noted.  Respiratory effort nonlabored Abd: soft, NT/ND, +BS, no masses, hernias, or organomegaly MS: BUE symmetrical with no cyanosis, clubbing, or edema. BLE with 1+ edema and chronic venous stasis changes. No gross sensory or motor deficits Skin: ulcer noted to posterior aspect of right lower leg with surrounding erythema from ankle to knee, no fluctuance Psych: A&Ox3 with an appropriate affect. Neuro: cranial nerves grossly intact, extremity CSM intact bilaterally, normal speech  Results for orders placed or performed during the hospital encounter of 07/25/17 (from the past 48 hour(s))  Glucose, capillary     Status: None   Collection Time: 07/26/17  1:53 PM  Result Value Ref Range   Glucose-Capillary 98 70 - 99 mg/dL  Glucose, capillary     Status: Abnormal   Collection Time: 07/26/17  5:13  PM  Result Value Ref Range   Glucose-Capillary 164 (H) 70 - 99 mg/dL  Glucose, capillary     Status: Abnormal   Collection Time: 07/26/17 10:46 PM  Result Value Ref Range   Glucose-Capillary 211 (H) 70 - 99 mg/dL  Comment 1 Notify RN    Comment 2 Document in Chart   Renal function panel     Status: Abnormal   Collection Time: 07/27/17  6:09 AM  Result Value Ref Range   Sodium 129 (L) 135 - 145 mmol/L   Potassium 3.8 3.5 - 5.1 mmol/L    Comment: NO VISIBLE HEMOLYSIS   Chloride 93 (L) 98 - 111 mmol/L    Comment: Please note change in reference range.   CO2 23 22 - 32 mmol/L   Glucose, Bld 146 (H) 70 - 99 mg/dL    Comment: Please note change in reference range.   BUN 42 (H) 8 - 23 mg/dL    Comment: Please note change in reference range.   Creatinine, Ser 4.24 (H) 0.61 - 1.24 mg/dL    Comment: DELTA CHECK NOTED   Calcium 7.9 (L) 8.9 - 10.3 mg/dL   Phosphorus 5.5 (H) 2.5 - 4.6 mg/dL   Albumin 2.5 (L) 3.5 - 5.0 g/dL   GFR calc non Af Amer 13 (L) >60 mL/min   GFR calc Af Amer 15 (L) >60 mL/min    Comment: (NOTE) The eGFR has been calculated using the CKD EPI equation. This calculation has not been validated in all clinical situations. eGFR's persistently <60 mL/min signify possible Chronic Kidney Disease.    Anion gap 13 5 - 15    Comment: Performed at [facility] [address], [place], [place] [address]  VITAMIN D 25 Hydroxy (Vit-D Deficiency, Fractures)     Status: Abnormal   Collection Time: 07/27/17  6:09 AM  Result Value Ref Range   Vit D, 25-Hydroxy 21.7 (L) 30.0 - 100.0 ng/mL    Comment: (NOTE) Vitamin D deficiency has been defined by the Institute of Medicine and an Endocrine Society practice guideline as a level of serum 25-OH vitamin D less than 20 ng/mL (1,2). The Endocrine Society went on to further define vitamin D insufficiency as a level between 21 and 29 ng/mL (2). 1. IOM (Institute of Medicine). 2010. Dietary reference   intakes for calcium and D.  [place]: The   [employer]. 2. Holick MF, Binkley [place], Bischoff-Ferrari HA, et al.   Evaluation, treatment, and prevention of vitamin D   deficiency: an Endocrine Society clinical practice   guideline. JCEM. 2011 Jul; 96(7):1911-30. Performed At: [facility] [place], [place] [id number] Rush Farmer MD HE:[id number] Performed at [facility], [place] [address] [place], [place] [address]   Glucose, capillary     Status: Abnormal   Collection Time: 07/27/17  8:12 AM  Result Value Ref Range   Glucose-Capillary 222 (H) 70 - 99 mg/dL  Glucose, capillary     Status: Abnormal   Collection Time: 07/27/17 12:23 PM  Result Value Ref Range   Glucose-Capillary 218 (H) 70 - 99 mg/dL  Glucose, capillary     Status: Abnormal   Collection Time: 07/27/17  5:01 PM  Result Value Ref Range   Glucose-Capillary 164 (H) 70 - 99 mg/dL  Glucose, capillary     Status: Abnormal   Collection Time: 07/27/17  9:08 PM  Result Value Ref Range   Glucose-Capillary 190 (H) 70 - 99 mg/dL  Glucose, capillary     Status: Abnormal   Collection Time: 07/28/17  8:10 AM  Result Value Ref Range   Glucose-Capillary 137 (H) 70 - 99 mg/dL   No results found.  Anti-infectives (From admission, onward)   Start  Dose/Rate Route Frequency Ordered Stop   07/26/17 1404  vancomycin (VANCOCIN) 1-5 GM/200ML-% IVPB    Note to Pharmacy:  Almira Bar   : cabinet override      07/26/17 1404 07/26/17 1431   07/26/17 1200  vancomycin (VANCOCIN) IVPB 1000 mg/200 mL premix     1,000 mg 200 mL/hr over 60 Minutes Intravenous Every T-Th-Sa (Hemodialysis) 07/25/17 1516     07/25/17 1315  vancomycin (VANCOCIN) 2,000 mg in sodium chloride 0.9 % 500 mL IVPB  Status:  Discontinued     2,000 mg 250 mL/hr over 120 Minutes Intravenous  Once 07/25/17 1304 07/25/17 1314   07/25/17 1315  vancomycin (VANCOCIN) IVPB 1000 mg/200 mL premix  Status:  Discontinued     1,000 mg 200 mL/hr over 60  Minutes Intravenous  Once 07/25/17 1314 07/25/17 1318       Assessment/Plan ESRD - HD TTS [id number] Systolic/diastolic heart failure CAD with h/o NSTEMI - on plavix HTN Anemia of chronic disease  Chronic ulcer RLE with surrounding cellulitis - Consult from nephrology for punch biopsy of this site to rule in/out calciphylaxis. I have ordered the supplies and we will perform punch biopsy tomorrow.  ID - vancomycin 7/1>> VTE - SCD, SQ heparin FEN - renal/CM diet Foley - none  Wellington [place], [facility] 07/28/2017, 10:58 AM Pager: [phone] Consults: [phone] Mon 7:00 am -11:30 AM Tues-Fri 7:00 am-4:30 pm Sat-Sun 7:00 am-11:30 am

## 2017-07-29 ENCOUNTER — Inpatient Hospital Stay (HOSPITAL_COMMUNITY): Payer: Non-veteran care

## 2017-07-29 LAB — BASIC METABOLIC PANEL
Anion gap: 17 — ABNORMAL HIGH (ref 5–15)
BUN: 28 mg/dL — ABNORMAL HIGH (ref 8–23)
CALCIUM: 7.8 mg/dL — AB (ref 8.9–10.3)
CO2: 24 mmol/L (ref 22–32)
CREATININE: 3.87 mg/dL — AB (ref 0.61–1.24)
Chloride: 94 mmol/L — ABNORMAL LOW (ref 98–111)
GFR calc Af Amer: 16 mL/min — ABNORMAL LOW (ref >60)
GFR, EST NON AFRICAN AMERICAN: 14 mL/min — AB (ref >60)
Glucose, Bld: 161 mg/dL — ABNORMAL HIGH (ref 70–99)
Potassium: 4.5 mmol/L (ref 3.5–5.1)
Sodium: 135 mmol/L (ref 135–145)

## 2017-07-29 LAB — RENAL FUNCTION PANEL
Albumin: 2.5 g/dL — ABNORMAL LOW (ref 3.5–5.0)
Anion gap: 15 (ref 5–15)
BUN: 29 mg/dL — ABNORMAL HIGH (ref 8–23)
CALCIUM: 7.8 mg/dL — AB (ref 8.9–10.3)
CO2: 25 mmol/L (ref 22–32)
CREATININE: 3.84 mg/dL — AB (ref 0.61–1.24)
Chloride: 95 mmol/L — ABNORMAL LOW (ref 98–111)
GFR calc non Af Amer: 14 mL/min — ABNORMAL LOW (ref >60)
GFR, EST AFRICAN AMERICAN: 17 mL/min — AB (ref >60)
GLUCOSE: 161 mg/dL — AB (ref 70–99)
Phosphorus: 3.9 mg/dL (ref 2.5–4.6)
Potassium: 4.5 mmol/L (ref 3.5–5.1)
SODIUM: 135 mmol/L (ref 135–145)

## 2017-07-29 LAB — CBC
HCT: 30 % — ABNORMAL LOW (ref 39.0–52.0)
HEMOGLOBIN: 9.1 g/dL — AB (ref 13.0–17.0)
MCH: 28.9 pg (ref 26.0–34.0)
MCHC: 30.3 g/dL (ref 30.0–36.0)
MCV: 95.2 fL (ref 78.0–100.0)
PLATELETS: 341 10*3/uL (ref 150–400)
RBC: 3.15 MIL/uL — AB (ref 4.22–5.81)
RDW: 15.5 % (ref 11.5–15.5)
WBC: 15.1 10*3/uL — ABNORMAL HIGH (ref 4.0–10.5)

## 2017-07-29 LAB — GLUCOSE, CAPILLARY
Glucose-Capillary: 152 mg/dL — ABNORMAL HIGH (ref 70–99)
Glucose-Capillary: 143 mg/dL — ABNORMAL HIGH (ref 70–99)
Glucose-Capillary: 185 mg/dL — ABNORMAL HIGH (ref 70–99)

## 2017-07-29 LAB — URINE CULTURE

## 2017-07-29 MED ORDER — ONDANSETRON HCL 4 MG/2ML IJ SOLN
4.0000 mg | Freq: Four times a day (QID) | INTRAMUSCULAR | Status: DC | PRN
  Administered 2017-07-29 – 2017-08-07 (×2): 4 mg via INTRAVENOUS
  Filled 2017-07-29 (×3): qty 2

## 2017-07-29 MED ORDER — CLINDAMYCIN HCL 300 MG PO CAPS
300.0000 mg | ORAL_CAPSULE | Freq: Four times a day (QID) | ORAL | Status: DC
  Administered 2017-07-29 – 2017-07-30 (×6): 300 mg via ORAL
  Filled 2017-07-29 (×6): qty 1

## 2017-07-29 MED ORDER — OXYCODONE HCL 5 MG PO TABS
2.5000 mg | ORAL_TABLET | Freq: Once | ORAL | Status: AC
  Administered 2017-07-29: 2.5 mg via ORAL
  Filled 2017-07-29: qty 1

## 2017-07-29 MED ORDER — MENTHOL 3 MG MT LOZG
1.0000 | LOZENGE | OROMUCOSAL | Status: DC | PRN
  Filled 2017-07-29: qty 9

## 2017-07-29 NOTE — Care Management Important Message (Signed)
Important Message  Patient Details  Name: Mitchell Garcia MRN: [id number] Date of Birth: 03-05-1945   Medicare Important Message Given:  Yes    [name] 07/29/2017, 4:51 PM

## 2017-07-29 NOTE — Progress Notes (Signed)
S: Feels better today but still unable to stand/walk due to right knee pain O:BP 139/65 (BP Location: Left Arm)   Pulse 88   Temp 98.5 F (36.9 C) (Oral)   Resp 18   Ht 5\' 11"  (1.803 m)   Wt 97.3 kg (214 lb 8.1 oz)   SpO2 94%   BMI 29.92 kg/m   Intake/Output Summary (Last 24 hours) at 07/29/2017 1355 Last data filed at 07/29/2017 0958 Gross per 24 hour  Intake 1064 ml  Output 2144 ml  Net -1080 ml   Intake/Output: I/O last 3 completed shifts: In: 510 [P.O.:821] Out: 2204 [Urine:185; CHENI:7782]  Intake/Output this shift:  Total I/O In: 480 [P.O.:480] Out: -  Weight change: 1.6 kg (3 lb 8.4 oz) Gen: NAD CVS: no rub Resp: cta Abd: benign Ext: no change in lesions of legs, no edema  Recent Labs  Lab 07/25/17 1050 07/26/17 0607 07/27/17 0609 07/28/17 2000 07/29/17 0532  NA 131* 129* 129* 125* 135  135  K 4.5 4.9 3.8 4.4 4.5  4.5  CL 92* 91* 93* 86* 95*  94*  CO2 20* 18* 23 23 25  24   GLUCOSE 145* 133* 146* 172* 161*  161*  BUN 83* 96* 42* 66* 29*  28*  CREATININE 6.15* 6.83* 4.24* 6.14* 3.84*  3.87*  ALBUMIN 3.0*  --  2.5* 2.5* 2.5*  CALCIUM 9.2 9.1 7.9* 7.9* 7.8*  7.8*  PHOS  --  8.3* 5.5* 6.8* 3.9  AST 21  --   --   --   --   ALT 22  --   --   --   --    Liver Function Tests: Recent Labs  Lab 07/25/17 1050 07/27/17 0609 07/28/17 2000 07/29/17 0532  AST 21  --   --   --   ALT 22  --   --   --   ALKPHOS 76  --   --   --   BILITOT 0.5  --   --   --   PROT 6.7  --   --   --   ALBUMIN 3.0* 2.5* 2.5* 2.5*   No results for input(s): LIPASE, AMYLASE in the last 168 hours. No results for input(s): AMMONIA in the last 168 hours. CBC: Recent Labs  Lab 07/25/17 1050 07/26/17 0607 07/28/17 2000 07/29/17 0532  WBC 18.0* 17.5* 13.9* 15.1*  NEUTROABS 15.4*  --   --   --   HGB 10.3* 8.7* 8.5* 9.1*  HCT 30.2* 28.4* 27.9* 30.0*  MCV 94.7 94.7 93.9 95.2  PLT 300 276 287 341   Cardiac Enzymes: No results for input(s): CKTOTAL, CKMB, CKMBINDEX,  TROPONINI in the last 168 hours. CBG: Recent Labs  Lab 07/28/17 0810 07/28/17 1149 07/28/17 1658 07/29/17 0801 07/29/17 1153  GLUCAP 137* 199* 145* 152* 143*    Iron Studies: No results for input(s): IRON, TIBC, TRANSFERRIN, FERRITIN in the last 72 hours. Studies/Results: No results found. [name] acetaminophen  650 mg Oral Q6H  . aspirin EC  81 mg Oral Daily  . atorvastatin  80 mg Oral QHS  . carvedilol  3.125 mg Oral BID WC  . Chlorhexidine Gluconate Cloth  6 each Topical Q0600  . cinacalcet  30 mg Oral Q supper  . clindamycin  300 mg Oral QID  . clopidogrel  75 mg Oral Daily  . darbepoetin (ARANESP) injection - DIALYSIS  60 mcg Intravenous Q Tue-HD  . gabapentin  300 mg Oral QHS  . heparin  5,000 Units  Subcutaneous Q8H  . insulin aspart  0-15 Units Subcutaneous TID WC  . insulin aspart  0-5 Units Subcutaneous QHS  . isosorbide-hydrALAZINE  1 tablet Oral BID  . multivitamin  1 tablet Oral QHS  . primidone  100 mg Oral Daily  . sevelamer carbonate  2,400 mg Oral TID WC  . vitamin B-12  1,000 mcg Oral Daily    BMET    Component Value Date/Time   NA 135 07/29/2017 0532   NA 135 07/29/2017 0532   NA 144 11/01/2016 1532   K 4.5 07/29/2017 0532   K 4.5 07/29/2017 0532   CL 95 (L) 07/29/2017 0532   CL 94 (L) 07/29/2017 0532   CO2 25 07/29/2017 0532   CO2 24 07/29/2017 0532   GLUCOSE 161 (H) 07/29/2017 0532   GLUCOSE 161 (H) 07/29/2017 0532   BUN 29 (H) 07/29/2017 0532   BUN 28 (H) 07/29/2017 0532   BUN 66 (H) 11/01/2016 1532   CREATININE 3.84 (H) 07/29/2017 0532   CREATININE 3.87 (H) 07/29/2017 0532   CALCIUM 7.8 (L) 07/29/2017 0532   CALCIUM 7.8 (L) 07/29/2017 0532   CALCIUM 8.2 (L) 05/19/2017 0230   GFRNONAA 14 (L) 07/29/2017 0532   GFRNONAA 14 (L) 07/29/2017 0532   GFRAA 17 (L) 07/29/2017 0532   GFRAA 16 (L) 07/29/2017 0532   CBC    Component Value Date/Time   WBC 15.1 (H) 07/29/2017 0532   RBC 3.15 (L) 07/29/2017 0532   HGB 9.1 (L) 07/29/2017 0532   HGB  8.7 (L) 11/01/2016 1532   HCT 30.0 (L) 07/29/2017 0532   HCT 26.6 (L) 11/01/2016 1532   PLT 341 07/29/2017 0532   PLT 328 11/01/2016 1532   MCV 95.2 07/29/2017 0532   MCV 92 11/01/2016 1532   MCH 28.9 07/29/2017 0532   MCHC 30.3 07/29/2017 0532   RDW 15.5 07/29/2017 0532   RDW 15.3 11/01/2016 1532   LYMPHSABS 0.8 07/25/2017 1050   MONOABS 1.6 (H) 07/25/2017 1050   EOSABS 0.0 07/25/2017 1050   BASOSABS 0.0 07/25/2017 1050     Outpatient HD Prescription [facility] [phone] Extension [id number] Charge RN) TTS 3K2.5 Ca 138Na 3hrs 45 min 400/800 37 temp EDW 97 kg (usually gets there) "Prn challenge by 0.5 as tolerated" TDC 1.6 ml hep dwell  3000 bolus heparin, no maintenance 2 mcg Hectorol 2 mcg TTS (STOPPED DUE TO CONCERNS OF CALCIPHYLAXIS THIS ADMISSION) Ferrlecit 125 mg once a week No ESA   Assessment/ Plan:  1. Right leg ulceration and surrounding cellulitis- was suspicious for calciphylaxis when he was seen on 07/07/17 by Dr. Lorrene Reid, which was a little unusual as he was new to HD, however he does have an elevated phos and iPTH. His vitamin D was stopped during his last admission but it appears to still be on his medication list.  1. Appreciate surgery consult for deep tissue biopsy to confirm dx of calciphylaxis. 2. haveincreasedrenvela to 2.4 grams per meal and addedsensipar with marked improvement in phos.  3. Discontinuedcalcitriol and ergocalciferol.  4. Will also use lower calcium bath.  5. Will hold off on sodium thiosulfate for now and continue with abx and local wound care.  6. Vascular studiesreveal non-compressible arteries and arterial wall calcification so the pathogenesis of tissue injury from calciphylaxis is similar but involving smaller vessels. These ulcers could be related to his vasculopathy and treatment would be similar, local wound care and phos and lipid control.  2. ESRD- Continue with HD q TTS. Cont on  schedule 3. Hypertension/volume- stable 4. Anemia- was not on aranesp will start and follow H/H and iron stores 5. Metabolic bone disease- better phos controlafter increasing renvela, adding sensipar, and stoppingvitamin D. His ulceration is suspicious for calciphylaxisbut also has known vascular calcifications of the large arteries of his legs. 6. Nutrition- cont with renal diet, carbohydrate modified 7. Vascular access- RAVF maturing, RIJ TDC functioning well. 8. Right knee pain - workup per primary svc but is main obstacle from standing/walking. 9. Disposition- will need SNF placement due to severe deconditioning and inability to stand/walk.   Donetta Potts, MD [employer] [phone]

## 2017-07-29 NOTE — Social Work (Addendum)
CSW f/u on disposition.  CSW faxed paperwork to [facility] to assist with SNF placement.  12:20pm: CSW met with pt/spouse at bedside to advise of [facility] status as paperwork faxed this morning. CSW also made referral to spiritual care as spouse would like patient to have poa paperwork signed.  Elissa Hefty, LCSW Clinical Social Worker [phone]

## 2017-07-29 NOTE — Progress Notes (Signed)
Physical Therapy Treatment Patient Details Name: Mitchell Garcia MRN: [id number] DOB: Jul 20, 1945 Today's Date: 07/29/2017    History of Present Illness Pt is a 72 y/o male admitted secondary to L LE cellulitis. PMH including but not limited to ESRD, DM, CHF, CAD s/p NSTEMI, CVA.    PT Comments    Pt reluctantly agreeable to PT with much encouragement; he is unwilling to attempt standing d/t RLE pain, mostly knee; knee is painful and edematous, discussed with RN; pt is progressing slowly, he has significant weakness greatly d/t sleeping in and using lift chair at home to aid sit to stand transfers;  Will continue to follow  Ina cute setting; continue to recommend SNF post acute  Follow Up Recommendations  SNF     Equipment Recommendations  None recommended by PT    Recommendations for Other Services       Precautions / Restrictions Precautions Precautions: Fall Restrictions Weight Bearing Restrictions: No    Mobility  Bed Mobility Overal bed mobility: Needs Assistance Bed Mobility: Supine to Sit;Sit to Supine     Supine to sit: Min assist;HOB elevated Sit to supine: Supervision   General bed mobility comments: significantly increased time and effort, use of bed rails, min A to elevate trunk  Transfers                 General transfer comment: pt refuses to attempt to stand d/t pain R LE, especially the knee;  pt able to scoot laterally along  length of bed with supervision and cues for technique  Ambulation/Gait                 Stairs             Wheelchair Mobility    Modified Rankin (Stroke Patients Only)       Balance     Sitting balance-Leahy Scale: Fair     Standing balance support: During functional activity;Bilateral upper extremity supported                                Cognition Arousal/Alertness: Awake/alert Behavior During Therapy: WFL for tasks assessed/performed(mildly agitated at times) Overall Cognitive  Status: Impaired/Different from baseline Area of Impairment: Following commands;Problem solving;Safety/judgement                       Following Commands: Follows one step commands consistently;Follows one step commands with increased time Safety/Judgement: Decreased awareness of deficits;Decreased awareness of safety   Problem Solving: Difficulty sequencing;Decreased initiation;Requires verbal cues;Slow processing        Exercises General Exercises - Lower Extremity Long Arc Quad: AROM;Both;5 reps;Limitations Long Arc Quad Limitations: RLE pain limiting ROM Heel Slides: AROM;Both;5 reps;Supine;Limitations Heel Slides Limitations: right LE too painful for >15* ROM    General Comments General comments (skin integrity, edema, etc.): educated pt on RLE positioning, ROM, pt tends to position RLE in external rotation and hip/knee flexion      Pertinent Vitals/Pain Pain Assessment: Faces Faces Pain Scale: Hurts worst Pain Location: R LE Pain Descriptors / Indicators: Constant;Grimacing;Guarding;Shooting Pain Intervention(s): Limited activity within patient's tolerance;Monitored during session;Premedicated before session;Repositioned    Home Living                      Prior Function            PT Goals (current goals can now be found in the care plan  section) Acute Rehab PT Goals Patient Stated Goal: decrease pain PT Goal Formulation: With patient Time For Goal Achievement: 08/10/17 Potential to Achieve Goals: Good Progress towards PT goals: Progressing toward goals    Frequency    Min 2X/week      PT Plan Current plan remains appropriate;Frequency needs to be updated    Co-evaluation              AM-PAC PT "6 Clicks" Daily Activity  Outcome Measure  Difficulty turning over in bed (including adjusting bedclothes, sheets and blankets)?: A Lot Difficulty moving from lying on back to sitting on the side of the bed? : Unable Difficulty sitting  down on and standing up from a chair with arms (e.g., wheelchair, bedside commode, etc,.)?: Unable Help needed moving to and from a bed to chair (including a wheelchair)?: A Lot Help needed walking in hospital room?: Total Help needed climbing 3-5 steps with a railing? : Total 6 Click Score: 8    End of Session   Activity Tolerance: Patient limited by pain;Patient limited by fatigue Patient left: in bed;with call bell/phone within reach;with bed alarm set;with family/visitor present;Other (comment)(MD) Nurse Communication: Mobility status PT Visit Diagnosis: Other abnormalities of gait and mobility (R26.89);Pain Pain - Right/Left: Right Pain - part of body: Leg;Knee     Time: 1607-3710 PT Time Calculation (min) (ACUTE ONLY): 23 min  Charges:  $Therapeutic Activity: 23-37 mins                    G CodesKenyon Ana, PT Pager: [phone] 07/29/2017    Kenyon Ana 07/29/2017, 1:17 PM

## 2017-07-29 NOTE — Progress Notes (Signed)
  Skin Biopsy Procedure Note  Procedure: Punch biopsy  Pre-operative Diagnosis: Ulcer right lower extremity  Post-procedure Diagnosis: same  Indications: evaluate for possible calciphylaxis  Anesthesia: lidocaine 1%  Procedure Details  The procedure, risks and complications have been discussed in detail (including, but not limited to pain, infection, bleeding) with the patient, and the patient wishesto proceed withthe procedure. The skin was sterilely prepped over the affected area in the usual fashion. Using a 88mm punch a skin biopsy was taken from lesion on posterior right lower leg. Specimen placed in formalin specimen cup and will be sent for pathology. Pressure held until hemostasis reached. Dry dressing applied. The patient was observed until stable. There were no complications, and the patient tolerated the procedure well. Dressing can be changed again in 24 hours.  Shauntia Levengood A Jizelle Conkey

## 2017-07-29 NOTE — Progress Notes (Addendum)
Family Medicine Teaching Service Daily Progress Note Intern Pager: [phone]  Patient name: Mitchell Garcia Medical record number: [id number] Date of birth: 1945-12-24 Age: 72 y.o. Gender: male  Primary Care Provider: [name], MD Consultants: nephrology, wound care Code Status: full  Pt Overview and Major Events to Date:  7/1- admitted to [facility] w/ nonhealing RIGHT leg wound 7/2- HD (TTS)  Assessment and Plan: Mitchell Garcia is a 72 y.o. male presenting with cellulitis of leg . 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 ABI's with low normal flow to right leg. Patient with many risk factors for poor wound healing. - Vitals per floor. Pulse Ox with vitals. - continue IV Vancomycin w/ HD per pharmacy, switch to PO Clindamycin (7/5) - wound care recommending daily dressing changes with aquacel ag moistened with NS covered w/ 4x4 and kerlix -medications to control phosphate increased by nephro -PTH, vit D levels pending - blood cx NGTD - Pain control with Tramadol 100mg  Q6h prn -scheduled tylenol - monitor WBC trend - Consulted surgery, planning for punch biopsy 7/5  Knee pain Pt has had ongoing knee pain since admission. Per pt, pain is getting progressively worse.  Cellulitis does not extend to knee. And knee has a superficial abrasion on the medial aspect. -CT R leg  Klebsiella UTI- noted on outpatient records from 6/28, patient reports he makes very little urine, currently denies burning with urination. This does not appear to have been treated. - repeat urinalysis - UCX (7/ ) contaminant growth  ESRD on HD - TTS schedule. - Nephrology following for HD - Avoid nephrotoxic agents. - Follow electrolytes daily - Strict I/O's - continue home renal  meds  Poorly controlled T2DM - Chronic.  Last A1c 7.6 in 09/2016. AM CBG 222 - mSSI with evening coverage - can add long acting insulin as needed  - Diabetic/renal diet  Anemia of chronic disease - Stable. baseline ~8 -continue to monitor - Darbepoetin 60 mcg started (7/4)  Combined HF - Chronic.  Currently asymptomatic aside from bilateral LE edema.  Most recent Echo 07/09/17 40-45%. - Strict I/O's - Continue to monitor. - continue home HF medications  CAD with history of NSTEMI - Chronic. S/P Stent x2 per record.Left Heart Cath 9/14 significant for multivessel disease with proximal LAD stenosis of 70%, total occlusion of mid LCx, and 95% of RCA stenosis.  On statin, Nitro, Coreg. - Continue ASA and Plavix - continue home meds   FEN/GI: renal/carb modified diet Prophylaxis: on plavix, heparin subq  Disposition: continued inpatient care, dispo pending PT/OT and cellulitis resolution  Subjective:  Pt continues to mention significant right knee pain.  Pt believes that the infection is spreading up his leg to his knee. Pt seems increasingly frustrated with the pain and jokingly mentioned cutting his right leg off.  Objective: Temp:  [98.4 F (36.9 C)-98.6 F (37 C)] 98.5 F (36.9 C) (07/05 0411) Pulse Rate:  [57-88] 88 (07/05 0411) Resp:  [14-23] 18 (07/05 0411) BP: (112-168)/(55-75) 139/65 (07/05 0411) SpO2:  [94 %-99 %] 94 % (07/05 0411) Weight:  [97.3 kg (214 lb 8.1 oz)-99.3 kg (218 lb 14.7 oz)] 97.3 kg (214 lb 8.1 oz) (07/04 2312) Physical Exam: General: Not well appearing older man lying in bed.  Awake and engaged in the conversation.  HEENT: EOMI, PERRL,  Neck non-tender without lymphadenopathy, masses or thyromegaly Cardio: Distant heart sounds, irregular rhythm,  Pulm: Auscultated longs only in lateral fields, clear to auscultation bilaterally, no crackles, wheezing, or diminished breath sounds. Normal respiratory effort Abdomen: Bowel sounds normal. Abdomen soft and  non-tender.  Extremities: No peripheral edema. Warm/ well perfused.  Strong radial pulses. Erythema on right lower leg appears unchanged from yesterday (7/4), right knee continues to give pt great pain when moving leg.   Neuro: Cranial nerves grossly intact   Laboratory: Recent Labs  Lab 07/26/17 0607 07/28/17 2000 07/29/17 0532  WBC 17.5* 13.9* 15.1*  HGB 8.7* 8.5* 9.1*  HCT 28.4* 27.9* 30.0*  PLT 276 287 341   Recent Labs  Lab 07/25/17 1050 07/26/17 0607 07/27/17 0609 07/28/17 2000  NA 131* 129* 129* 125*  K 4.5 4.9 3.8 4.4  CL 92* 91* 93* 86*  CO2 20* 18* 23 23  BUN 83* 96* 42* 66*  CREATININE 6.15* 6.83* 4.24* 6.14*  CALCIUM 9.2 9.1 7.9* 7.9*  PROT 6.7  --   --   --   BILITOT 0.5  --   --   --   ALKPHOS 76  --   --   --   ALT 22  --   --   --   AST 21  --   --   --   GLUCOSE 145* 133* 146* 172*   CBG (last 3)  Recent Labs    07/28/17 0810 07/28/17 1149 07/28/17 1658  GLUCAP 137* 199* 145*    Results for orders placed or performed during the hospital encounter of 07/25/17  Blood culture (routine x 2)     Status: None (Preliminary result)   Collection Time: 07/25/17 12:30 PM  Result Value Ref Range Status   Specimen Description BLOOD LEFT ANTECUBITAL  Final   Special Requests   Final    BOTTLES DRAWN AEROBIC ONLY Blood Culture adequate volume   Culture   Final    NO GROWTH 3 DAYS Performed at [facility], [address], [place], [place] [address]    Report Status PENDING  Incomplete  Blood culture (routine x 2)     Status: None (Preliminary result)   Collection Time: 07/25/17 12:38 PM  Result Value Ref Range Status   Specimen Description BLOOD SITE NOT SPECIFIED  Final   Special Requests   Final    BOTTLES DRAWN AEROBIC ONLY Blood Culture results may not be optimal due to an inadequate volume of blood received in culture bottles   Culture   Final    NO GROWTH 3 DAYS Performed at [facility], [place] [address] [place], [place] [address]     Report Status PENDING  Incomplete    Culture, Urine Urine Urine, Clean CatchResulted: 07/25/2017 1:47 PM [facility] Component Name Value Ref Range  Urine Culture KLEBSIELLA OXYTOCA (A)   Specimen Collected on  Urine - Urine, Clean Catch 07/22/2017 5:38 PM   Organism Antibiotic Method Susceptibility  Unknown Organism Amoxicillin + Clavulanate MIC >=32 ug/mL: Resistant   Ampicillin MIC >=32 ug/mL: Resistant   Aztreonam MIC 16 ug/mL: Intermediate   Cefazolin MIC >=64 ug/mL: Resistant   Cefepime MIC <=1 ug/mL: Susceptible   Ceftriaxone MIC 4 ug/mL: Susceptible   Ciprofloxacin MIC <=0.25 ug/mL: Susceptible   Gentamicin MIC <=1 ug/mL: Susceptible   Nitrofurantoin MIC <=16 ug/mL: Susceptible   Piperacillin + Tazobactam MIC >=128 ug/mL: Resistant   Trimethoprim + Sulfamethoxazole MIC <=20 ug/mL: Susceptible  Comment:  Ceftriaxone: Updated CLSI breakpoints indicate an MIC >2 ug/mL is non-susceptible.[employer] MICs are based on one-time FDA recommendations at the time of test approval.CLSI breakpoints are reviewed annually and should be used for therapeutic decision  making.    Imaging/Diagnostic Tests:  Dg Knee 1-2 Views Right IMPRESSION: No focal bone abnormality. Soft tissue swelling. Electronically Signed   By: Kerby Moors M.D.   On: 07/25/2017 17:26   Dg Tibia/fibula Right IMPRESSION: No acute abnormality. Atherosclerosis. Electronically Signed   By: Inge Rise M.D.   On: 07/25/2017 17:28    Matilde Haymaker, MD 07/29/2017, 6:44 AM PGY-1, [place] Intern pager: [phone], text pages welcome

## 2017-07-30 DIAGNOSIS — M25561 Pain in right knee: Secondary | ICD-10-CM

## 2017-07-30 LAB — CBC
HCT: 28.7 % — ABNORMAL LOW (ref 39.0–52.0)
HEMATOCRIT: 28.6 % — AB (ref 39.0–52.0)
HEMOGLOBIN: 8.5 g/dL — AB (ref 13.0–17.0)
Hemoglobin: 8.6 g/dL — ABNORMAL LOW (ref 13.0–17.0)
MCH: 28.7 pg (ref 26.0–34.0)
MCH: 28.9 pg (ref 26.0–34.0)
MCHC: 30 g/dL (ref 30.0–36.0)
MCHC: 29.7 g/dL — AB (ref 30.0–36.0)
MCV: 95.7 fL (ref 78.0–100.0)
MCV: 97.3 fL (ref 78.0–100.0)
PLATELETS: 351 10*3/uL (ref 150–400)
Platelets: 344 10*3/uL (ref 150–400)
RBC: 3 MIL/uL — ABNORMAL LOW (ref 4.22–5.81)
RBC: 2.94 MIL/uL — ABNORMAL LOW (ref 4.22–5.81)
RDW: 15.8 % — AB (ref 11.5–15.5)
RDW: 15.6 % — AB (ref 11.5–15.5)
WBC: 14.7 10*3/uL — AB (ref 4.0–10.5)
WBC: 14.7 10*3/uL — ABNORMAL HIGH (ref 4.0–10.5)

## 2017-07-30 LAB — RENAL FUNCTION PANEL
ALBUMIN: 2.4 g/dL — AB (ref 3.5–5.0)
Anion gap: 16 — ABNORMAL HIGH (ref 5–15)
BUN: 48 mg/dL — AB (ref 8–23)
CHLORIDE: 89 mmol/L — AB (ref 98–111)
CO2: 25 mmol/L (ref 22–32)
Calcium: 7.6 mg/dL — ABNORMAL LOW (ref 8.9–10.3)
Creatinine, Ser: 5.4 mg/dL — ABNORMAL HIGH (ref 0.61–1.24)
GFR calc Af Amer: 11 mL/min — ABNORMAL LOW (ref >60)
GFR calc non Af Amer: 9 mL/min — ABNORMAL LOW (ref >60)
Glucose, Bld: 147 mg/dL — ABNORMAL HIGH (ref 70–99)
POTASSIUM: 4.2 mmol/L (ref 3.5–5.1)
Phosphorus: 5 mg/dL — ABNORMAL HIGH (ref 2.5–4.6)
SODIUM: 130 mmol/L — AB (ref 135–145)

## 2017-07-30 LAB — CULTURE, BLOOD (ROUTINE X 2)
CULTURE: NO GROWTH
CULTURE: NO GROWTH
SPECIAL REQUESTS: ADEQUATE

## 2017-07-30 LAB — GLUCOSE, CAPILLARY
Glucose-Capillary: 141 mg/dL — ABNORMAL HIGH (ref 70–99)
Glucose-Capillary: 95 mg/dL (ref 70–99)
Glucose-Capillary: 213 mg/dL — ABNORMAL HIGH (ref 70–99)
Glucose-Capillary: 152 mg/dL — ABNORMAL HIGH (ref 70–99)

## 2017-07-30 MED ORDER — HEPARIN SODIUM (PORCINE) 1000 UNIT/ML DIALYSIS
20.0000 [IU]/kg | INTRAMUSCULAR | Status: DC | PRN
  Filled 2017-07-30: qty 2

## 2017-07-30 MED ORDER — OXYCODONE HCL 5 MG PO TABS
2.5000 mg | ORAL_TABLET | Freq: Three times a day (TID) | ORAL | Status: DC | PRN
  Administered 2017-07-30 – 2017-08-03 (×7): 2.5 mg via ORAL
  Filled 2017-07-30 (×8): qty 1

## 2017-07-30 NOTE — Consult Note (Signed)
Reason for consult: Right knee pain  Consulting MD: Family Medicine Teaching Service  I was asked to see Mr. Mitchell Garcia due to continued right knee pain that is limiting his ability to mobilize.  He has been in the hospital for a week at least now.  His main diagnosis is cellulitis involving the right lower extremity.  He denies any specific injuries to that right knee.  I was able to review plain films as well as CT scan of the right knee independently and see no gross findings that contribute to severe right knee pain.  On examination I remove the Ace wrap from around his knee and the knee joint itself does not show any significant effusion.  There is may be a trace effusion.  There is no pre-patella swelling.  There is no soft tissue abnormalities behind the knee.  There are healing eschars from scabs around the knee anteriorly.  He does hold his right knee in a flexed position as well as his right hip flexed and this is a position usually seen when someone is dealing with pain.  It is difficult to get his knee completely extended but there is no blocks to this other than him regarding significantly due to pain.  I do feel that a lot of his pain in his knee is related to him dealing with cellulitis that is at the calf and proximal and this is causing contractions of the gastroc muscles that run behind the knee and attach at the femur.  Given his poorly controlled diabetes and his ongoing infection in that leg, I would not recommend right now a steroid injection in his right knee.  I do feel the steroid certainly can help intra-articularly with coming down his right knee pain, but I do not feel this appropriate to place a steroid right now in this setting given his continued leg issues.  I do not see anything that warrants any type of surgical intervention.  I would not place any type of wraps around his knee itself and would recommend any types of pain medications will help decrease his pain, certainly  this is difficult situation in light of his significant kidney disease and diabetes.  I would feel more comfortable placing a steroid injection in his right knee at a later date as an outpatient once his other issues are further resolving.

## 2017-07-30 NOTE — Progress Notes (Signed)
  Punch biopsy site cdi without drainage or bleeding. Continue prior wound care to area as per WOC. Pathology pending. General surgery will sign off, please call with concerns.  Wellington Hampshire, [facility] 07/30/2017, 7:49 AM Pager: [phone] Consults: [phone] Mon 7:00 am -11:30 AM Tues-Fri 7:00 am-4:30 pm Sat-Sun 7:00 am-11:30 am

## 2017-07-30 NOTE — Progress Notes (Signed)
Family Medicine Teaching Service Daily Progress Note Intern Pager: [phone]  Patient name: Mitchell Garcia Medical record number: [id number] Date of birth: 16-Aug-1945 Age: 72 y.o. Gender: male  Primary Care Provider: [name], MD Consultants: nephrology, wound care Code Status: full  Pt Overview and Major Events to Date:  7/1- admitted to [facility] w/ nonhealing RIGHT leg wound 7/2- HD (TTS)  Assessment and Plan: Mitchell Garcia is a 72 y.o. male presenting with cellulitis of leg . 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 ABI's with low normal flow to right leg. Patient with many risk factors for poor wound healing. - Vitals per floor. Pulse Ox with vitals. - previously failed outpt tetracycline therapy for cellulitis - improvement on vanc (7/1-7/5) - Clindamycin 300 mg PO (7/5- ) - blood cx NGTD for 4 days (7/6) - wound care recommending daily dressing changes with aquacel ag moistened with NS covered w/ 4x4 and kerlix - Erythema on right lower leg appears slightly improved from yesterday on the ventral surface, erythema on ventral surface now extending slightly above right knee. - nephro consulted, continue to appreciate recs -- sevelamir 2.4 grams/meal -- cinacalcet 30 mg -- holding: calcitriol, ergocalciferol, sodium thiosulfate - Pain control with Tramadol 100mg  Q6h prn - scheduled tylenol - monitor WBC trend -- 18.0 (7/1) -> 17.5 (7/2) -> 13.9 (7/4) -> 15.1 (7/5) -> 14.7 (7/6) - Punch biopsy by surgery on 7/5  Knee pain Pt has had ongoing knee pain since admission. Per pt, pain is getting progressively worse.  Cellulitis does not extend to knee. And knee has a superficial abrasion on the medial aspect. -CT R leg (7/5) - no findings of abscess or  osteomyelitis. Subcutaneous edema, minimal subcutaneous gas, small knee effusion -ortho consulted  Klebsiella UTI- noted on outpatient records from 6/28, patient reports he makes very little urine, currently denies burning with urination. This does not appear to have been treated. - repeat urinalysis - UCX (7/ 4) contaminant growth - pt continues to report pain with urination that began with his 'kidney problems' several months ago. No acute changes - presumed colonization with klebsiella, will not treat at this time  ESRD on HD - TTS schedule. - Nephrology following for HD - Avoid nephrotoxic agents. - Follow electrolytes daily - Strict I/O's - continue home renal meds  Poorly controlled T2DM - Chronic.  Last A1c 7.6 in 09/2016. AM CBG 222 - mSSI with evening coverage - can add long acting insulin as needed  - Diabetic/renal diet  Anemia of chronic disease - Stable. baseline ~8 -continue to monitor - Darbepoetin 60 mcg started (7/4)  Combined HF - Chronic.  Currently asymptomatic aside from bilateral LE edema.  Most recent Echo 07/09/17 40-45%. - Strict I/O's - Continue to monitor. - continue home HF medications  CAD with history of NSTEMI - Chronic. S/P Stent x2 per record.Left Heart Cath 9/14 significant for multivessel disease with proximal LAD stenosis of 70%, total occlusion of mid LCx, and 95% of RCA stenosis.  On statin, Nitro, Coreg. - Continue ASA and Plavix - continue home meds   FEN/GI: renal/carb modified diet Prophylaxis: on plavix, heparin subq  Disposition: continued inpatient care, dispo pending PT/OT and cellulitis resolution  Subjective:  Spoke with this patient during hemodialysis this morning.  Patient has no new complaints today.  He continues to note right knee pain that is poorly controlled.  Patient feels that he has subjectively improved during his time in the hospital.  Objective: Temp:  [97.7 F (36.5 C)-98.6 F (37 C)] 98.6 F (37 C)  (07/06 0550) Pulse Rate:  [64-73] 73 (07/06 0550) Resp:  [17] 17 (07/06 0550) BP: (105-136)/(63-68) 121/68 (07/06 0550) SpO2:  [91 %-95 %] 91 % (07/06 0550) Weight:  [96.7 kg (213 lb 3 oz)] 96.7 kg (213 lb 3 oz) (07/06 0550) Physical Exam: General: Not well appearing older man lying in bed.  Awake and engaged in the conversation.  HEENT: EOMI, PERRL, Neck non-tender without lymphadenopathy, masses or thyromegaly Cardio: Distant heart sounds, regular rhythm with occasional dropped beats. Strong radial pulses Pulm: Auscultated longs only in lateral fields, clear to auscultation bilaterally, no crackles, wheezing, or diminished breath sounds. Normal respiratory effort Abdomen: Bowel sounds normal. Abdomen soft and non-tender.  Extremities: No peripheral edema. Warm/ well perfused.  Strong radial pulses. Erythema on right lower leg appears slightly improved from yesterday on the ventral surface, erythema on ventral surface now extending slightly above right knee. right knee continues to give pt great pain when moving leg.   Neuro: Cranial nerves grossly intact   Laboratory: Recent Labs  Lab 07/28/17 2000 07/29/17 0532 07/30/17 0647  WBC 13.9* 15.1* 14.7*  HGB 8.5* 9.1* 8.6*  HCT 27.9* 30.0* 28.7*  PLT 287 341 351   Recent Labs  Lab 07/25/17 1050  07/27/17 0609 07/28/17 2000 07/29/17 0532  NA 131*   < > 129* 125* 135  135  K 4.5   < > 3.8 4.4 4.5  4.5  CL 92*   < > 93* 86* 95*  94*  CO2 20*   < > 23 23 25  24   BUN 83*   < > 42* 66* 29*  28*  CREATININE 6.15*   < > 4.24* 6.14* 3.84*  3.87*  CALCIUM 9.2   < > 7.9* 7.9* 7.8*  7.8*  PROT 6.7  --   --   --   --   BILITOT 0.5  --   --   --   --   ALKPHOS 76  --   --   --   --   ALT 22  --   --   --   --   AST 21  --   --   --   --   GLUCOSE 145*   < > 146* 172* 161*  161*   < > = values in this interval not displayed.   CBG (last 3)  Recent Labs    07/29/17 0801 07/29/17 1153 07/29/17 2141  GLUCAP 152* 143* 185*     Results for orders placed or performed during the hospital encounter of 07/25/17  Blood culture (routine x 2)     Status: None (Preliminary result)   Collection Time: 07/25/17 12:30 PM  Result Value Ref Range Status   Specimen Description BLOOD LEFT ANTECUBITAL  Final   Special Requests   Final    BOTTLES DRAWN AEROBIC ONLY Blood Culture adequate volume   Culture   Final    NO GROWTH 4 DAYS Performed at [facility], [place] [address] [place], [place] [address]    Report Status PENDING  Incomplete  Blood culture (routine x 2)     Status: None (Preliminary result)   Collection Time: 07/25/17 12:38 PM  Result Value Ref Range Status   Specimen Description BLOOD SITE  NOT SPECIFIED  Final   Special Requests   Final    BOTTLES DRAWN AEROBIC ONLY Blood Culture results may not be optimal due to an inadequate volume of blood received in culture bottles   Culture   Final    NO GROWTH 4 DAYS Performed at [facility], [place] [address], [place], [place] [address]    Report Status PENDING  Incomplete  Culture, Urine     Status: Abnormal   Collection Time: 07/28/17  7:40 AM  Result Value Ref Range Status   Specimen Description URINE, RANDOM  Final   Special Requests   Final    NONE Performed at [facility], [place] [address] [place], [place] [address]    Culture MULTIPLE SPECIES PRESENT, SUGGEST RECOLLECTION (A)  Final   Report Status 07/29/2017 FINAL  Final    Culture, Urine Urine Urine, Clean CatchResulted: 07/25/2017 1:47 PM [facility] Component Name Value Ref Range  Urine Culture KLEBSIELLA OXYTOCA (A)   Specimen Collected on  Urine - Urine, Clean Catch 07/22/2017 5:38 PM   Organism Antibiotic Method Susceptibility  Unknown Organism Amoxicillin + Clavulanate MIC >=32 ug/mL: Resistant   Ampicillin MIC >=32 ug/mL: Resistant   Aztreonam MIC 16 ug/mL: Intermediate   Cefazolin MIC >=64 ug/mL: Resistant   Cefepime MIC <=1 ug/mL: Susceptible   Ceftriaxone MIC 4  ug/mL: Susceptible   Ciprofloxacin MIC <=0.25 ug/mL: Susceptible   Gentamicin MIC <=1 ug/mL: Susceptible   Nitrofurantoin MIC <=16 ug/mL: Susceptible   Piperacillin + Tazobactam MIC >=128 ug/mL: Resistant   Trimethoprim + Sulfamethoxazole MIC <=20 ug/mL: Susceptible  Comment:  Ceftriaxone: Updated CLSI breakpoints indicate an MIC >2 ug/mL is non-susceptible.[employer] MICs are based on one-time FDA recommendations at the time of test approval.CLSI breakpoints are reviewed annually and should be used for therapeutic decision  making.    Imaging/Diagnostic Tests:  Dg Knee 1-2 Views Right IMPRESSION: No focal bone abnormality. Soft tissue swelling. Electronically Signed   By: Kerby Moors M.D.   On: 07/25/2017 17:26   Dg Tibia/fibula Right IMPRESSION: No acute abnormality. Atherosclerosis. Electronically Signed   By: Inge Rise M.D.   On: 07/25/2017 17:28   Ct Extremity Lower Right Wo Contrast  Result Date: 07/29/2017 CLINICAL DATA:  Lower extremity cellulitis.  Penetrating trauma EXAM: CT OF THE LOWER RIGHT EXTREMITY WITHOUT CONTRAST TECHNIQUE: Multidetector CT imaging of the right lower extremity was performed. Today's exam from the iliac crest through the toes would typically require at least 3 separate CT examinations as there are multiple regions encompassed, each with their own procedure code. There is no single defined CPT code aside from a CT angiogram runoff that includes the whole pelvis, thigh, leg, and foot. As a courtesy this single time, we are performing and reporting this as one CT scan. In the future, individual regions of the extremity need their own order, such as thigh, tibia/fibula, and foot. Reconstructions in this case are not ideal for assessment of individual structures such as joints, this is a large field survey and not a detailed assessment of any of the included joints. The knee is moderately flexed during imaging, resulting in non ideal axial images particularly  through the calf. COMPARISON:  None. FINDINGS: Bones/Joint/Cartilage No osteomyelitis seen, low sensitivity in assessing the toes. No definite bony destructive findings. Mild osteoarthritis of the knee. Trace knee effusion. Ligaments Not assessed by CT. Muscles and Tendons No appreciable drainable abscess is observed on today's noncontrast assessment. Soft tissues Subcutaneous edema most notable  along the proximal calf, with a suggestion of some skin disruption posteriorly along the mid calf. I do not see a foreign body. There may be some very minimal subcutaneous gas adjacent to the skin disruption for example on image 258/4. Notable arterial atherosclerotic calcification. Mild subcutaneous edema tracking along the dorsum of the foot. IMPRESSION: 1. No findings of abscess or osteomyelitis. 2. Skin disruption along the posterior calf with some confluent regional subcutaneous edema potentially reflecting cellulitis. 3. Notable atherosclerotic calcification. 4. Dorsal subcutaneous edema in the foot, nonspecific. Electronically Signed   By: Van Clines M.D.   On: 07/29/2017 17:07     Matilde Haymaker, MD 07/30/2017, 7:59 AM PGY-1, [facility] Intern pager: [phone], text pages welcome

## 2017-07-30 NOTE — Procedures (Signed)
I was present at this dialysis session. I have reviewed the session itself and made appropriate changes.   Filed Weights   07/28/17 1925 07/28/17 2312 07/30/17 0550  Weight: 99.3 kg (218 lb 14.7 oz) 97.3 kg (214 lb 8.1 oz) 96.7 kg (213 lb 3 oz)    Recent Labs  Lab 07/29/17 0532  NA 135  135  K 4.5  4.5  CL 95*  94*  CO2 25  24  GLUCOSE 161*  161*  BUN 29*  28*  CREATININE 3.84*  3.87*  CALCIUM 7.8*  7.8*  PHOS 3.9    Recent Labs  Lab 07/25/17 1050  07/28/17 2000 07/29/17 0532 07/30/17 0647  WBC 18.0*   < > 13.9* 15.1* 14.7*  NEUTROABS 15.4*  --   --   --   --   HGB 10.3*   < > 8.5* 9.1* 8.6*  HCT 30.2*   < > 27.9* 30.0* 28.7*  MCV 94.7   < > 93.9 95.2 95.7  PLT 300   < > 287 341 351   < > = values in this interval not displayed.    Scheduled Meds: . acetaminophen  650 mg Oral Q6H  . aspirin EC  81 mg Oral Daily  . atorvastatin  80 mg Oral QHS  . carvedilol  3.125 mg Oral BID WC  . Chlorhexidine Gluconate Cloth  6 each Topical Q0600  . cinacalcet  30 mg Oral Q supper  . clindamycin  300 mg Oral QID  . clopidogrel  75 mg Oral Daily  . darbepoetin (ARANESP) injection - DIALYSIS  60 mcg Intravenous Q Tue-HD  . gabapentin  300 mg Oral QHS  . heparin  5,000 Units Subcutaneous Q8H  . insulin aspart  0-15 Units Subcutaneous TID WC  . insulin aspart  0-5 Units Subcutaneous QHS  . isosorbide-hydrALAZINE  1 tablet Oral BID  . multivitamin  1 tablet Oral QHS  . primidone  100 mg Oral Daily  . sevelamer carbonate  2,400 mg Oral TID WC  . vitamin B-12  1,000 mcg Oral Daily   Continuous Infusions: PRN Meds:.ALPRAZolam, heparin, heparin, lidocaine, menthol-cetylpyridinium, nitroGLYCERIN, ondansetron (ZOFRAN) IV, traMADol    Outpatient HD Prescription [facility] [phone] Extension [phone] Charge RN) TTS 3K2.5 Ca 138Na 3hrs 45 min 400/800 37 temp EDW 97 kg (usually gets there) "Prn challenge by 0.5 as tolerated" TDC 1.6 ml hep dwell  3000 bolus  heparin, no maintenance 2 mcg Hectorol 2 mcg TTS(STOPPED DUE TO CONCERNS OF CALCIPHYLAXIS THIS ADMISSION) Ferrlecit 125 mg once a week No ESA   Assessment/ Plan:  1. Right leg ulceration and surrounding cellulitis- was suspicious for calciphylaxis when he was seen on 07/07/17 by Dr. Lorrene Reid, which was a little unusual as he was new to HD, however he does have an elevated phos and iPTH. His vitamin D was stopped during his last admission but it appears to still be on his medication list.  1. Appreciate surgery consult for deep tissue biopsy to confirm dx of calciphylaxis. 2. haveincreasedrenvela to 2.4 grams per meal and addedsensipar with marked improvement in phos.  3. Discontinuedcalcitriol and ergocalciferol.  4. Will also use lower calcium bath.  5. Will hold off on sodium thiosulfate for now and continue with abx and local wound care.  6. Vascular studiesreveal non-compressible arteries and arterial wall calcification so the pathogenesis of tissue injury from calciphylaxis is similar but involving smaller vessels. These ulcers could be related to his vasculopathy and treatment would be similar,  local wound care and phos and lipid control.  2. ESRD- Continue with HD q TTS. Cont on schedule 3. Hypertension/volume- stable 4. Anemia- was not on aranesp will start and follow H/H and iron stores 5. Metabolic bone disease- better phos controlafter increasing renvela, adding sensipar, and stoppingvitamin D. His ulceration is suspicious for calciphylaxisbut also has known vascular calcifications of the large arteries of his legs. 6. Nutrition- cont with renal diet, carbohydrate modified 7. Vascular access- RAVF maturing, RIJ TDC functioning well. 8. Right knee pain - workup per primary svc but is main obstacle from standing/walking.  CT negative for osteo.  Recommend Ortho evaluation. 9. Disposition- will need SNF placement due to severe deconditioning and  inability to stand/walk.   Donetta Potts,  MD 07/30/2017, 8:40 AM

## 2017-07-31 LAB — GLUCOSE, CAPILLARY
GLUCOSE-CAPILLARY: 231 mg/dL — AB (ref 70–99)
GLUCOSE-CAPILLARY: 231 mg/dL — AB (ref 70–99)
Glucose-Capillary: 115 mg/dL — ABNORMAL HIGH (ref 70–99)
Glucose-Capillary: 259 mg/dL — ABNORMAL HIGH (ref 70–99)

## 2017-07-31 LAB — BASIC METABOLIC PANEL
Anion gap: 12 (ref 5–15)
BUN: 26 mg/dL — ABNORMAL HIGH (ref 8–23)
CO2: 27 mmol/L (ref 22–32)
Calcium: 7.7 mg/dL — ABNORMAL LOW (ref 8.9–10.3)
Chloride: 93 mmol/L — ABNORMAL LOW (ref 98–111)
Creatinine, Ser: 3.8 mg/dL — ABNORMAL HIGH (ref 0.61–1.24)
GFR calc Af Amer: 17 mL/min — ABNORMAL LOW (ref >60)
GFR calc non Af Amer: 14 mL/min — ABNORMAL LOW (ref >60)
Glucose, Bld: 128 mg/dL — ABNORMAL HIGH (ref 70–99)
Potassium: 3.8 mmol/L (ref 3.5–5.1)
Sodium: 132 mmol/L — ABNORMAL LOW (ref 135–145)

## 2017-07-31 LAB — CBC
HCT: 28.8 % — ABNORMAL LOW (ref 39.0–52.0)
Hemoglobin: 8.7 g/dL — ABNORMAL LOW (ref 13.0–17.0)
MCH: 29.3 pg (ref 26.0–34.0)
MCHC: 30.2 g/dL (ref 30.0–36.0)
MCV: 97 fL (ref 78.0–100.0)
PLATELETS: 343 10*3/uL (ref 150–400)
RBC: 2.97 MIL/uL — ABNORMAL LOW (ref 4.22–5.81)
RDW: 15.6 % — AB (ref 11.5–15.5)
WBC: 14.3 10*3/uL — ABNORMAL HIGH (ref 4.0–10.5)

## 2017-07-31 MED ORDER — VANCOMYCIN HCL IN DEXTROSE 1-5 GM/200ML-% IV SOLN
1000.0000 mg | Freq: Once | INTRAVENOUS | Status: AC
  Administered 2017-07-31: 1000 mg via INTRAVENOUS
  Filled 2017-07-31 (×2): qty 200

## 2017-07-31 MED ORDER — VANCOMYCIN HCL IN DEXTROSE 1-5 GM/200ML-% IV SOLN: 1000.0000 mg | INTRAVENOUS | Status: DC

## 2017-07-31 MED ORDER — CINACALCET HCL 30 MG PO TABS
30.0000 mg | ORAL_TABLET | ORAL | Status: DC
  Administered 2017-08-02 – 2017-08-06 (×4): 30 mg via ORAL
  Filled 2017-07-31 (×5): qty 1

## 2017-07-31 MED ORDER — CEFAZOLIN SODIUM-DEXTROSE 1-4 GM/50ML-% IV SOLN
1.0000 g | INTRAVENOUS | Status: DC
  Administered 2017-07-31: 1 g via INTRAVENOUS
  Filled 2017-07-31 (×2): qty 50

## 2017-07-31 NOTE — Progress Notes (Signed)
Spoke with ID, they recommend increased coverage for strep.  Recommend D/C vancomycin and starting Ancef.  Will order with pharmacy consult as he is a dialysis patient.  Bernita Raisin Rittberger, DO 2:14 PM

## 2017-07-31 NOTE — Progress Notes (Signed)
Family Medicine Teaching Service Daily Progress Note Intern Pager: [phone]  Patient name: Mitchell Garcia Medical record number: [id number] Date of birth: Feb 25, 1945 Age: 72 y.o. Gender: male  Primary Care Provider: [name], MD Consultants: nephrology, orthopedics, wound care  Code Status: Full   Pt Overview and Major Events to Date:  7/1 - admitted to FPTS  Assessment and Plan: Mitchell Chretien Parrishis a 72 y.o.malepresenting with cellulitis of leg. PMH is significant for ESRD on HD, T2DM, anemia, combined systolic/diastolic HF, CAD with history of NSTEMI, chronic leg wound  Cellulitis of right leg with non-healing ulcer  Possible component of calciphylaxis as well as infectious. X-rays of leg and knee reassuring, no bony abnormalities. ABI's with low normal flow to right leg. Patient with many risk factors for poor wound healing. Previously failed outpt tetracycline therapy for cellulitis. S/p vanc (7/1-7/5). Punch biopsy obtained on 07/30/17 by general surgery. On 7/7, area appeared to be worsening compared to borders and has spread to anterior portion of leg. ID called, recommended increased Strep coverage with Ancef and d/c vanc.  Leg seems to be improving today, on ly splotchy erythema anterior left shin.  Afebrile overnight. Wound leaks serosanguinous, mucopurulent drainage onto bandage.  Removed dressing and nurse replaced. - Clindamycin 300 mg PO (7/5-7/7), vancomycin (7/7), ancef (7/7- )  - appreciate ID recommendations - blood cx NGTD  - wound care consulted, appreciate recommendations: recommending daily dressing changes with aquacel ag moistened with NS covered w/ 4x4 and kerlix - nephro consulted, appreciate recs - sevelamir 2.4 grams/meal - cinacalcet 30 mg - holding: calcitriol, ergocalciferol, sodium thiosulfate - Pain control with Tramadol 100mg  Q6h prn - scheduled tylenol - monitor WBC trend - punch bx path pending - called path, should be finished  today  Knee pain Pt has had ongoing knee pain since admission and continues to complain of knee pain. Per pt, pain is getting progressively worse.  Cellulitis does not extend to knee. And knee has a superficial abrasion on the medial aspect. CT or R lower extremity showing no findings of abscess or osteomyelitis.  -ortho consulted, appreciate recommendations: possible knee injection as outpatient, no surgical intervention needed -ice packs prn for knee pain  Klebsiella UTI Noted on outpatient records from 6/28, patient reports he makes very little urine, currently denies burning with urination. This does not appear to have been treated. UCX from 7/4 showing contaminant growth - pt continues to report pain with urination that began with his 'kidney problems' several months ago. No acute changes - presumed colonization with klebsiella, will not treat at this time  ESRD on HD- TTS schedule. - Nephrology following for HD - Avoid nephrotoxic agents. - Follow electrolytes daily - Strict I/O's - continue home renal meds  Poorly controlled T2DM Chronic. Last A1c 7.6 in 09/2016. AM CBG 152 - mSSIwith evening coverage - can add long acting insulin as needed - Diabetic/renal diet - measure A1C with next labs  Anemia of chronic disease Stable. baseline ~8 - continue to monitor - Darbepoetin 60 mcg started (7/4)  Combined HF Chronic. Most recent Echo 07/09/17 showing LVEF 40-45%. - Strict I/O's - Continue to monitor. - continue home HF medications  CAD with history of NSTEMI Chronic. S/P Stent x2. Left Heart Cath 9/14 significant for multivessel disease with proximal LAD stenosis of 70%, total occlusion of mid LCx, and 95% of RCA stenosis. On statin, Nitro, Coreg. - ASA and Plavix - continue home meds  Hyponatremia 135 on 7/5, has trended down and  decreased to 129 this AM. Patient ESRD on HD. - continue to monitor  FEN/GI:renal/carb modified diet Prophylaxis:on  plavix, heparin subq  Disposition: continued inpatient stay for IV abx   Subjective:  Patient continues to complain of R knee pain.  Denies any other complaints.  States that he thinks if his knee would stop hurting he would feel better.  Objective: Temp:  [97.5 F (36.4 C)-98.2 F (74.8 C)] 98.2 F (36.8 C) (07/08 0510) Pulse Rate:  [51-86] 86 (07/08 0832) Resp:  [16-20] 16 (07/08 0510) BP: (108-125)/(59-67) 125/67 (07/08 0832) SpO2:  [95 %-98 %] 95 % (07/08 0510)    Physical Exam: General: 72 yo male, awake and alert, laying in bed, NAD Cardiovascular: RRR, 3/6 systolic murmur Respiratory: CTAB, no wheezes, rales, or rhonchi  Abdomen: soft, non tender, non distended, bowel sounds normal  Extremities: 2+ pitting edema in Right foot, erythema within borders on posterior leg, splotchy erythema on anterior shin, mucopurulent and serosanguinous drainage on bandage  Laboratory: Recent Labs  Lab 07/30/17 0950 07/31/17 0555 08/01/17 0612  WBC 14.7* 14.3* 14.8*  HGB 8.5* 8.7* 8.7*  HCT 28.6* 28.8* 28.5*  PLT 344 343 340   Recent Labs  Lab 07/25/17 1050  07/30/17 0950 07/31/17 0555 08/01/17 0612  NA 131*   < > 130* 132* 129*  K 4.5   < > 4.2 3.8 4.2  CL 92*   < > 89* 93* 91*  CO2 20*   < > 25 27 25   BUN 83*   < > 48* 26* 42*  CREATININE 6.15*   < > 5.40* 3.80* 5.21*  CALCIUM 9.2   < > 7.6* 7.7* 7.9*  PROT 6.7  --   --   --   --   BILITOT 0.5  --   --   --   --   ALKPHOS 76  --   --   --   --   ALT 22  --   --   --   --   AST 21  --   --   --   --   GLUCOSE 145*   < > 147* 128* 141*   < > = values in this interval not displayed.     Imaging/Diagnostic Tests: Dg Chest 2 View  Result Date: 07/07/2017 CLINICAL DATA:  Syncope with fall this morning. Shortness of breath and dizziness. EXAM: CHEST - 2 VIEW COMPARISON:  05/26/2017 FINDINGS: Right IJ central venous catheter unchanged with tip over the SVC. Lungs are adequately inflated without focal consolidation or  effusion. Mild stable cardiomegaly. Remainder of the exam is unchanged. IMPRESSION: No acute findings. Stable cardiomegaly.  Right IJ central venous catheter unchanged. Electronically Signed   By: Marin Olp M.D.   On: 07/07/2017 19:45   Dg Knee 1-2 Views Right  Result Date: 07/25/2017 CLINICAL DATA:  Wound cellulitis. EXAM: RIGHT KNEE - 1-2 VIEW COMPARISON:  07/25/2017 FINDINGS: No joint effusion identified. Mild diffuse soft tissue swelling noted. Vascular calcifications are noted no fracture or dislocation identified. No focal bone erosions. IMPRESSION: No focal bone abnormality. Soft tissue swelling. Electronically Signed   By: Kerby Moors M.D.   On: 07/25/2017 17:26   Dg Tibia/fibula Right  Result Date: 07/25/2017 CLINICAL DATA:  Soft tissue wounds about the right knee and lower leg. EXAM: RIGHT TIBIA AND FIBULA - 2 VIEW COMPARISON:  None. FINDINGS: No acute bony or joint abnormality is identified. No soft tissue gas or radiopaque foreign body is seen. Atherosclerosis is  noted. IMPRESSION: No acute abnormality. Atherosclerosis. Electronically Signed   By: Inge Rise M.D.   On: 07/25/2017 17:28   Ct Head Wo Contrast  Result Date: 07/07/2017 CLINICAL DATA:  Fall today, striking head/face on asphalt. Left facial pain. EXAM: CT HEAD WITHOUT CONTRAST CT CERVICAL SPINE WITHOUT CONTRAST TECHNIQUE: Multidetector CT imaging of the head and cervical spine was performed following the standard protocol without intravenous contrast. Multiplanar CT image reconstructions of the cervical spine were also generated. COMPARISON:  CT head from 01/25/2013 FINDINGS: CT HEAD FINDINGS Brain: The brainstem, cerebellum, cerebral peduncles, thalami, basal ganglia, basilar cisterns, and ventricular system appear within normal limits. No intracranial hemorrhage, mass lesion, or acute CVA. Vascular: There is atherosclerotic calcification of the cavernous carotid arteries bilaterally. Skull: Unremarkable Sinuses/Orbits:  Chronic ethmoid sinusitis. Opacification of the visualized right maxillary sinus. Other: No supplemental non-categorized findings. CT CERVICAL SPINE FINDINGS Alignment: No vertebral subluxation is observed. Skull base and vertebrae: Degenerative endplate sclerosis at W9-7 with loss of disc height at C3-4, C5-6, C6-7, and C7-T1. Notable spurring in the cervical and thoracic spine. Soft tissues and spinal canal: Enlarged right thyroid lobe with a suspected 2.4 cm right thyroid nodule on image 80/5. Bilateral common carotid atherosclerotic calcification. Disc levels: There is suspected osseous foraminal narrowing on the left at C3-4, C4-5, C5-6, and C6-7; and on the right at C5-6. I cannot exclude a central disc protrusion at C5-6. Upper chest: Atherosclerotic calcification of the aortic arch and branch vessels. Right upper paratracheal lymph node borderline enlarged at 1.0 cm on image 89/5. Other: No supplemental non-categorized findings. IMPRESSION: 1. No acute intracranial findings or acute cervical spine findings. 2. 2.4 cm right thyroid lobe nodule. Consider further evaluation with thyroid ultrasound. If patient is clinically hyperthyroid, consider nuclear medicine thyroid uptake and scan. 3. Cervical spondylosis and degenerative disc disease with potential impingement at C3-4, C4-5, C5-6, and C6-7. 4. Atherosclerosis. 5. Upper normal sized right upper paratracheal lymph node, nonspecific. 6. Chronic ethmoid sinusitis. There is also opacification of visualized right maxillary sinus. Electronically Signed   By: Van Clines M.D.   On: 07/07/2017 13:13   Ct Cervical Spine Wo Contrast  Result Date: 07/07/2017 CLINICAL DATA:  Fall today, striking head/face on asphalt. Left facial pain. EXAM: CT HEAD WITHOUT CONTRAST CT CERVICAL SPINE WITHOUT CONTRAST TECHNIQUE: Multidetector CT imaging of the head and cervical spine was performed following the standard protocol without intravenous contrast. Multiplanar CT  image reconstructions of the cervical spine were also generated. COMPARISON:  CT head from 01/25/2013 FINDINGS: CT HEAD FINDINGS Brain: The brainstem, cerebellum, cerebral peduncles, thalami, basal ganglia, basilar cisterns, and ventricular system appear within normal limits. No intracranial hemorrhage, mass lesion, or acute CVA. Vascular: There is atherosclerotic calcification of the cavernous carotid arteries bilaterally. Skull: Unremarkable Sinuses/Orbits: Chronic ethmoid sinusitis. Opacification of the visualized right maxillary sinus. Other: No supplemental non-categorized findings. CT CERVICAL SPINE FINDINGS Alignment: No vertebral subluxation is observed. Skull base and vertebrae: Degenerative endplate sclerosis at L8-9 with loss of disc height at C3-4, C5-6, C6-7, and C7-T1. Notable spurring in the cervical and thoracic spine. Soft tissues and spinal canal: Enlarged right thyroid lobe with a suspected 2.4 cm right thyroid nodule on image 80/5. Bilateral common carotid atherosclerotic calcification. Disc levels: There is suspected osseous foraminal narrowing on the left at C3-4, C4-5, C5-6, and C6-7; and on the right at C5-6. I cannot exclude a central disc protrusion at C5-6. Upper chest: Atherosclerotic calcification of the aortic arch and branch vessels. Right upper  paratracheal lymph node borderline enlarged at 1.0 cm on image 89/5. Other: No supplemental non-categorized findings. IMPRESSION: 1. No acute intracranial findings or acute cervical spine findings. 2. 2.4 cm right thyroid lobe nodule. Consider further evaluation with thyroid ultrasound. If patient is clinically hyperthyroid, consider nuclear medicine thyroid uptake and scan. 3. Cervical spondylosis and degenerative disc disease with potential impingement at C3-4, C4-5, C5-6, and C6-7. 4. Atherosclerosis. 5. Upper normal sized right upper paratracheal lymph node, nonspecific. 6. Chronic ethmoid sinusitis. There is also opacification of  visualized right maxillary sinus. Electronically Signed   By: Van Clines M.D.   On: 07/07/2017 13:13   Ct Extremity Lower Right Wo Contrast  Result Date: 07/29/2017 CLINICAL DATA:  Lower extremity cellulitis.  Penetrating trauma EXAM: CT OF THE LOWER RIGHT EXTREMITY WITHOUT CONTRAST TECHNIQUE: Multidetector CT imaging of the right lower extremity was performed. Today's exam from the iliac crest through the toes would typically require at least 3 separate CT examinations as there are multiple regions encompassed, each with their own procedure code. There is no single defined CPT code aside from a CT angiogram runoff that includes the whole pelvis, thigh, leg, and foot. As a courtesy this single time, we are performing and reporting this as one CT scan. In the future, individual regions of the extremity need their own order, such as thigh, tibia/fibula, and foot. Reconstructions in this case are not ideal for assessment of individual structures such as joints, this is a large field survey and not a detailed assessment of any of the included joints. The knee is moderately flexed during imaging, resulting in non ideal axial images particularly through the calf. COMPARISON:  None. FINDINGS: Bones/Joint/Cartilage No osteomyelitis seen, low sensitivity in assessing the toes. No definite bony destructive findings. Mild osteoarthritis of the knee. Trace knee effusion. Ligaments Not assessed by CT. Muscles and Tendons No appreciable drainable abscess is observed on today's noncontrast assessment. Soft tissues Subcutaneous edema most notable along the proximal calf, with a suggestion of some skin disruption posteriorly along the mid calf. I do not see a foreign body. There may be some very minimal subcutaneous gas adjacent to the skin disruption for example on image 258/4. Notable arterial atherosclerotic calcification. Mild subcutaneous edema tracking along the dorsum of the foot. IMPRESSION: 1. No findings of  abscess or osteomyelitis. 2. Skin disruption along the posterior calf with some confluent regional subcutaneous edema potentially reflecting cellulitis. 3. Notable atherosclerotic calcification. 4. Dorsal subcutaneous edema in the foot, nonspecific. Electronically Signed   By: Van Clines M.D.   On: 07/29/2017 17:07    Rittberger, Bernita Raisin, DO 08/01/2017, 9:46 AM PGY-1, [place] Intern pager: [phone], text pages welcome

## 2017-07-31 NOTE — Progress Notes (Addendum)
Addendum: Family Medicine called ID who recommended better strep coverage. Orders to discontinue Vancomycin and change to Cefazolin per pharmacy dosing.   Plan: Cefazolin 1g IV every 24 hours - after HD on HD days.  Monitor renal function, culture results, and clinical status.   Sloan Leiter, PharmD, BCPS, BCCCP Clinical Pharmacist Clinical phone 07/31/2017 until 3:30PM [phone] After hours, please call [phone] 2:32 PM, 07/31/2017    Pharmacy Antibiotic Note  Mitchell Garcia is a 72 y.o. male admitted on 07/25/2017 with cellulitis.  Pharmacy has been consulted for vancomycin dosing. Pt with history of ESRD on HD. He has been on vancomycin x 3 weeks PTA and  random vancomycin level on admission was therapeutic at 19. HD regimen remains T-Th-Sat with next HD today. WBC slowly trending down.   Patient was changed to Clindamycin on 7/5 but due to worsening erythema, team is re-broadening to IV Vancomycin today 7/7.  Last HD was 7/6 - tolerated full session. Last Vancomycin dose was post-HD on 7/4.   Plan: Give Vancomycin 1gm IV x1 today, Then restart Vancomycin 1gm post-HD TThS F/u renal plans, C&S, clinical status and vanc levels PRN   Height: 5\' 11"  (180.3 cm) Weight: 217 lb 13 oz (98.8 kg) IBW/kg (Calculated) : 75.3  Temp (24hrs), Avg:98 F (36.7 C), Min:97.6 F (36.4 C), Max:98.8 F (37.1 C)  Recent Labs  Lab 07/25/17 1112 07/25/17 1357  07/27/17 0609 07/28/17 2000 07/29/17 0532 07/30/17 0647 07/30/17 0950 07/31/17 0555  WBC  --   --    < >  --  13.9* 15.1* 14.7* 14.7* 14.3*  CREATININE  --   --    < > 4.24* 6.14* 3.84*  3.87*  --  5.40* 3.80*  LATICACIDVEN 1.18  --   --   --   --   --   --   --   --   VANCORANDOM  --  19  --   --   --   --   --   --   --    < > = values in this interval not displayed.    Estimated Creatinine Clearance: 20.4 mL/min (A) (by C-G formula based on SCr of 3.8 mg/dL (H)).    Allergies  Allergen Reactions  . Oxycodone Other (See  Comments)    DIFFICULT TO WAKE UP  . Tramadol Nausea And Vomiting  . Lisinopril Other (See Comments)    Hyperkalemia   . Losartan Other (See Comments)    Hyperkalemia     Antimicrobials this admission: Vanc PTA>>7/5; restart 7/7 >> Clindamycin 7/5 >>7/7  Dose adjustments this admission: 7/1 VR = 19  Microbiology results: 7/1 Blood >> 7/4 Urine >>  Thank you for allowing pharmacy to be a part of this patient's care.  Sloan Leiter, PharmD, BCPS, BCCCP Clinical Pharmacist Clinical phone 07/31/2017 until 3:30PM [phone] After hours, please call #[phone] 07/31/2017 8:26 AM

## 2017-07-31 NOTE — Progress Notes (Addendum)
Family Medicine Teaching Service Daily Progress Note Intern Pager: [phone]  Patient name: Mitchell Garcia Medical record number: [id number] Date of birth: 12/19/1945 Age: 72 y.o. Gender: male  Primary Care Provider: [name], MD Consultants: nephrology, orthopedics, wound care  Code Status: Full   Pt Overview and Major Events to Date:  7/1 - admitted to FPTS  Assessment and Plan: Kai Calico Parrishis a 72 y.o.malepresenting with cellulitis of leg. PMH is significant for ESRD on HD, T2DM, anemia, combined systolic/diastolic HF, CAD with history of NSTEMI, chronic leg wound  Cellulitis of right leg with non-healing ulcer  Possible component of calciphylaxis as well as infectious. X-rays of leg and knee reassuring, no bony abnormalities. ABI's with low normal flow to right leg. Patient with many risk factors for poor wound healing. Previously failed outpt tetracycline therapy for cellulitis. S/p vanc (7/1-7/5). Punch biopsy obtained on 07/30/17 by general surgery. Area appears to be worsening compared to borders and has spread to anterior portion of leg. Left leg also with erythema, however around sock line so possibly 2/2 sock compression. Edema to right foot as well.  - Clindamycin 300 mg PO (7/5-7/7), will broaden antibiotic coverage to vancomycin (7/7-) given worsening in erythema  - blood cx NGTD  - wound care consulted, appreciate recommendations: recommending daily dressing changes with aquacel ag moistened with NS covered w/ 4x4 and kerlix - nephro consulted, appreciate recs - sevelamir 2.4 grams/meal - cinacalcet 30 mg - holding: calcitriol, ergocalciferol, sodium thiosulfate - Pain control with Tramadol 100mg  Q6h prn - scheduled tylenol - monitor WBC trend - punch bx path pending   Knee pain Pt has had ongoing knee pain since admission. Per pt, pain is getting progressively worse.  Cellulitis does not extend to knee. And knee has a superficial abrasion on the medial  aspect. CT or R lower extremity showing no findings of abscess or osteomyelitis.  -ortho consulted, appreciate recommendations: possible knee injection as outpatient, no surgical intervention needed  Klebsiella UTI Noted on outpatient records from 6/28, patient reports he makes very little urine, currently denies burning with urination. This does not appear to have been treated. UCX from 7/4 showing contaminant growth - pt continues to report pain with urination that began with his 'kidney problems' several months ago. No acute changes - presumed colonization with klebsiella, will not treat at this time  ESRD on HD- TTS schedule. - Nephrology following for HD - Avoid nephrotoxic agents. - Follow electrolytes daily - Strict I/O's - continue home renal meds  Poorly controlled T2DM Chronic. Last A1c 7.6 in 09/2016. AM CBG 152 - mSSIwith evening coverage - can add long acting insulin as needed - Diabetic/renal diet - measure A1C with next labs  Anemia of chronic disease Stable. baseline ~8 - continue to monitor - Darbepoetin 60 mcg started (7/4)  Combined HF Chronic. Most recent Echo 07/09/17 showing LVEF 40-45%. - Strict I/O's - Continue to monitor. - continue home HF medications  CAD with history of NSTEMI Chronic. S/P Stent x2. Left Heart Cath 9/14 significant for multivessel disease with proximal LAD stenosis of 70%, total occlusion of mid LCx, and 95% of RCA stenosis. On statin, Nitro, Coreg. - ASA and Plavix - continue home meds  FEN/GI:renal/carb modified diet Prophylaxis:on plavix, heparin subq  Disposition: continued inpatient stay for IV abx   Subjective:  Patient today states he feels "rough". States that his leg is not doing well. Patient reports that erythema is now worsening to cover his anterior leg. Patient is  in good spirits, however, and was excited to tell everyone about his birthday.   Objective: Temp:  [97.6 F (36.4 C)-98.8 F (37.1  C)] 98.8 F (37.1 C) (07/07 0521) Pulse Rate:  [56-82] 60 (07/07 0521) Resp:  [16-18] 16 (07/07 0521) BP: (101-150)/(53-85) 101/53 (07/07 0521) SpO2:  [92 %-94 %] 94 % (07/07 0521) Weight:  [214 lb 8.1 oz (97.3 kg)-219 lb 12.8 oz (99.7 kg)] 217 lb 13 oz (98.8 kg) (07/07 0521) Physical Exam: General: awake and alert, laying in bed, NAD Cardiovascular: RRR, no MRG Respiratory: CTAB, no wheezes, rales, or rhonchi  Abdomen: soft, non tender, non distended, bowel sounds normal  Extremities: 2+ pitting edema in Right foot, erythema slightly above borders in posterior lower leg, erythema of anterior portion of right lower leg  Laboratory: Recent Labs  Lab 07/30/17 0647 07/30/17 0950 07/31/17 0555  WBC 14.7* 14.7* 14.3*  HGB 8.6* 8.5* 8.7*  HCT 28.7* 28.6* 28.8*  PLT 351 344 343   Recent Labs  Lab 07/25/17 1050  07/29/17 0532 07/30/17 0950 07/31/17 0555  NA 131*   < > 135  135 130* 132*  K 4.5   < > 4.5  4.5 4.2 3.8  CL 92*   < > 95*  94* 89* 93*  CO2 20*   < > 25  24 25 27   BUN 83*   < > 29*  28* 48* 26*  CREATININE 6.15*   < > 3.84*  3.87* 5.40* 3.80*  CALCIUM 9.2   < > 7.8*  7.8* 7.6* 7.7*  PROT 6.7  --   --   --   --   BILITOT 0.5  --   --   --   --   ALKPHOS 76  --   --   --   --   ALT 22  --   --   --   --   AST 21  --   --   --   --   GLUCOSE 145*   < > 161*  161* 147* 128*   < > = values in this interval not displayed.     Imaging/Diagnostic Tests: Dg Chest 2 View  Result Date: 07/07/2017 CLINICAL DATA:  Syncope with fall this morning. Shortness of breath and dizziness. EXAM: CHEST - 2 VIEW COMPARISON:  05/26/2017 FINDINGS: Right IJ central venous catheter unchanged with tip over the SVC. Lungs are adequately inflated without focal consolidation or effusion. Mild stable cardiomegaly. Remainder of the exam is unchanged. IMPRESSION: No acute findings. Stable cardiomegaly.  Right IJ central venous catheter unchanged. Electronically Signed   By: Marin Olp  M.D.   On: 07/07/2017 19:45   Dg Knee 1-2 Views Right  Result Date: 07/25/2017 CLINICAL DATA:  Wound cellulitis. EXAM: RIGHT KNEE - 1-2 VIEW COMPARISON:  07/25/2017 FINDINGS: No joint effusion identified. Mild diffuse soft tissue swelling noted. Vascular calcifications are noted no fracture or dislocation identified. No focal bone erosions. IMPRESSION: No focal bone abnormality. Soft tissue swelling. Electronically Signed   By: Kerby Moors M.D.   On: 07/25/2017 17:26   Dg Tibia/fibula Right  Result Date: 07/25/2017 CLINICAL DATA:  Soft tissue wounds about the right knee and lower leg. EXAM: RIGHT TIBIA AND FIBULA - 2 VIEW COMPARISON:  None. FINDINGS: No acute bony or joint abnormality is identified. No soft tissue gas or radiopaque foreign body is seen. Atherosclerosis is noted. IMPRESSION: No acute abnormality. Atherosclerosis. Electronically Signed   By: Inge Rise M.D.   On:  07/25/2017 17:28   Ct Head Wo Contrast  Result Date: 07/07/2017 CLINICAL DATA:  Fall today, striking head/face on asphalt. Left facial pain. EXAM: CT HEAD WITHOUT CONTRAST CT CERVICAL SPINE WITHOUT CONTRAST TECHNIQUE: Multidetector CT imaging of the head and cervical spine was performed following the standard protocol without intravenous contrast. Multiplanar CT image reconstructions of the cervical spine were also generated. COMPARISON:  CT head from 01/25/2013 FINDINGS: CT HEAD FINDINGS Brain: The brainstem, cerebellum, cerebral peduncles, thalami, basal ganglia, basilar cisterns, and ventricular system appear within normal limits. No intracranial hemorrhage, mass lesion, or acute CVA. Vascular: There is atherosclerotic calcification of the cavernous carotid arteries bilaterally. Skull: Unremarkable Sinuses/Orbits: Chronic ethmoid sinusitis. Opacification of the visualized right maxillary sinus. Other: No supplemental non-categorized findings. CT CERVICAL SPINE FINDINGS Alignment: No vertebral subluxation is observed.  Skull base and vertebrae: Degenerative endplate sclerosis at O1-7 with loss of disc height at C3-4, C5-6, C6-7, and C7-T1. Notable spurring in the cervical and thoracic spine. Soft tissues and spinal canal: Enlarged right thyroid lobe with a suspected 2.4 cm right thyroid nodule on image 80/5. Bilateral common carotid atherosclerotic calcification. Disc levels: There is suspected osseous foraminal narrowing on the left at C3-4, C4-5, C5-6, and C6-7; and on the right at C5-6. I cannot exclude a central disc protrusion at C5-6. Upper chest: Atherosclerotic calcification of the aortic arch and branch vessels. Right upper paratracheal lymph node borderline enlarged at 1.0 cm on image 89/5. Other: No supplemental non-categorized findings. IMPRESSION: 1. No acute intracranial findings or acute cervical spine findings. 2. 2.4 cm right thyroid lobe nodule. Consider further evaluation with thyroid ultrasound. If patient is clinically hyperthyroid, consider nuclear medicine thyroid uptake and scan. 3. Cervical spondylosis and degenerative disc disease with potential impingement at C3-4, C4-5, C5-6, and C6-7. 4. Atherosclerosis. 5. Upper normal sized right upper paratracheal lymph node, nonspecific. 6. Chronic ethmoid sinusitis. There is also opacification of visualized right maxillary sinus. Electronically Signed   By: Van Clines M.D.   On: 07/07/2017 13:13   Ct Cervical Spine Wo Contrast  Result Date: 07/07/2017 CLINICAL DATA:  Fall today, striking head/face on asphalt. Left facial pain. EXAM: CT HEAD WITHOUT CONTRAST CT CERVICAL SPINE WITHOUT CONTRAST TECHNIQUE: Multidetector CT imaging of the head and cervical spine was performed following the standard protocol without intravenous contrast. Multiplanar CT image reconstructions of the cervical spine were also generated. COMPARISON:  CT head from 01/25/2013 FINDINGS: CT HEAD FINDINGS Brain: The brainstem, cerebellum, cerebral peduncles, thalami, basal ganglia,  basilar cisterns, and ventricular system appear within normal limits. No intracranial hemorrhage, mass lesion, or acute CVA. Vascular: There is atherosclerotic calcification of the cavernous carotid arteries bilaterally. Skull: Unremarkable Sinuses/Orbits: Chronic ethmoid sinusitis. Opacification of the visualized right maxillary sinus. Other: No supplemental non-categorized findings. CT CERVICAL SPINE FINDINGS Alignment: No vertebral subluxation is observed. Skull base and vertebrae: Degenerative endplate sclerosis at P1-0 with loss of disc height at C3-4, C5-6, C6-7, and C7-T1. Notable spurring in the cervical and thoracic spine. Soft tissues and spinal canal: Enlarged right thyroid lobe with a suspected 2.4 cm right thyroid nodule on image 80/5. Bilateral common carotid atherosclerotic calcification. Disc levels: There is suspected osseous foraminal narrowing on the left at C3-4, C4-5, C5-6, and C6-7; and on the right at C5-6. I cannot exclude a central disc protrusion at C5-6. Upper chest: Atherosclerotic calcification of the aortic arch and branch vessels. Right upper paratracheal lymph node borderline enlarged at 1.0 cm on image 89/5. Other: No supplemental non-categorized findings. IMPRESSION: 1.  No acute intracranial findings or acute cervical spine findings. 2. 2.4 cm right thyroid lobe nodule. Consider further evaluation with thyroid ultrasound. If patient is clinically hyperthyroid, consider nuclear medicine thyroid uptake and scan. 3. Cervical spondylosis and degenerative disc disease with potential impingement at C3-4, C4-5, C5-6, and C6-7. 4. Atherosclerosis. 5. Upper normal sized right upper paratracheal lymph node, nonspecific. 6. Chronic ethmoid sinusitis. There is also opacification of visualized right maxillary sinus. Electronically Signed   By: Van Clines M.D.   On: 07/07/2017 13:13   Ct Extremity Lower Right Wo Contrast  Result Date: 07/29/2017 CLINICAL DATA:  Lower extremity  cellulitis.  Penetrating trauma EXAM: CT OF THE LOWER RIGHT EXTREMITY WITHOUT CONTRAST TECHNIQUE: Multidetector CT imaging of the right lower extremity was performed. Today's exam from the iliac crest through the toes would typically require at least 3 separate CT examinations as there are multiple regions encompassed, each with their own procedure code. There is no single defined CPT code aside from a CT angiogram runoff that includes the whole pelvis, thigh, leg, and foot. As a courtesy this single time, we are performing and reporting this as one CT scan. In the future, individual regions of the extremity need their own order, such as thigh, tibia/fibula, and foot. Reconstructions in this case are not ideal for assessment of individual structures such as joints, this is a large field survey and not a detailed assessment of any of the included joints. The knee is moderately flexed during imaging, resulting in non ideal axial images particularly through the calf. COMPARISON:  None. FINDINGS: Bones/Joint/Cartilage No osteomyelitis seen, low sensitivity in assessing the toes. No definite bony destructive findings. Mild osteoarthritis of the knee. Trace knee effusion. Ligaments Not assessed by CT. Muscles and Tendons No appreciable drainable abscess is observed on today's noncontrast assessment. Soft tissues Subcutaneous edema most notable along the proximal calf, with a suggestion of some skin disruption posteriorly along the mid calf. I do not see a foreign body. There may be some very minimal subcutaneous gas adjacent to the skin disruption for example on image 258/4. Notable arterial atherosclerotic calcification. Mild subcutaneous edema tracking along the dorsum of the foot. IMPRESSION: 1. No findings of abscess or osteomyelitis. 2. Skin disruption along the posterior calf with some confluent regional subcutaneous edema potentially reflecting cellulitis. 3. Notable atherosclerotic calcification. 4. Dorsal  subcutaneous edema in the foot, nonspecific. Electronically Signed   By: Van Clines M.D.   On: 07/29/2017 17:07    Caroline More, DO 07/31/2017, 8:06 AM PGY-2, [facility] Intern pager: [phone], text pages welcome

## 2017-07-31 NOTE — Progress Notes (Signed)
S: No new complaints  O:BP 121/83 (BP Location: Left Arm)   Pulse 70   Temp 98.8 F (37.1 C) (Oral)   Resp 16   Ht 5\' 11"  (1.803 m)   Wt 98.8 kg (217 lb 13 oz)   SpO2 94%   BMI 30.38 kg/m   Intake/Output Summary (Last 24 hours) at 07/31/2017 1303 Last data filed at 07/31/2017 0500 Gross per 24 hour  Intake -  Output 0 ml  Net 0 ml   Intake/Output: I/O last 3 completed shifts: In: -  Out: 1500 [Other:1500]  Intake/Output this shift:  No intake/output data recorded. Weight change: 2 kg (4 lb 6.5 oz) Gen: NAD CVS: no rub Resp: cta Abd: benign Ext: no change to wound, right cimino avf +T/B  Recent Labs  Lab 07/25/17 1050 07/26/17 0607 07/27/17 0609 07/28/17 2000 07/29/17 0532 07/30/17 0950 07/31/17 0555  NA 131* 129* 129* 125* 135  135 130* 132*  K 4.5 4.9 3.8 4.4 4.5  4.5 4.2 3.8  CL 92* 91* 93* 86* 95*  94* 89* 93*  CO2 20* 18* 23 23 25  24 25 27   GLUCOSE 145* 133* 146* 172* 161*  161* 147* 128*  BUN 83* 96* 42* 66* 29*  28* 48* 26*  CREATININE 6.15* 6.83* 4.24* 6.14* 3.84*  3.87* 5.40* 3.80*  ALBUMIN 3.0*  --  2.5* 2.5* 2.5* 2.4*  --   CALCIUM 9.2 9.1 7.9* 7.9* 7.8*  7.8* 7.6* 7.7*  PHOS  --  8.3* 5.5* 6.8* 3.9 5.0*  --   AST 21  --   --   --   --   --   --   ALT 22  --   --   --   --   --   --    Liver Function Tests: Recent Labs  Lab 07/25/17 1050  07/28/17 2000 07/29/17 0532 07/30/17 0950  AST 21  --   --   --   --   ALT 22  --   --   --   --   ALKPHOS 76  --   --   --   --   BILITOT 0.5  --   --   --   --   PROT 6.7  --   --   --   --   ALBUMIN 3.0*   < > 2.5* 2.5* 2.4*   < > = values in this interval not displayed.   No results for input(s): LIPASE, AMYLASE in the last 168 hours. No results for input(s): AMMONIA in the last 168 hours. CBC: Recent Labs  Lab 07/25/17 1050  07/28/17 2000 07/29/17 0532 07/30/17 0647 07/30/17 0950 07/31/17 0555  WBC 18.0*   < > 13.9* 15.1* 14.7* 14.7* 14.3*  NEUTROABS 15.4*  --   --   --   --   --    --   HGB 10.3*   < > 8.5* 9.1* 8.6* 8.5* 8.7*  HCT 30.2*   < > 27.9* 30.0* 28.7* 28.6* 28.8*  MCV 94.7   < > 93.9 95.2 95.7 97.3 97.0  PLT 300   < > 287 341 351 344 343   < > = values in this interval not displayed.   Cardiac Enzymes: No results for input(s): CKTOTAL, CKMB, CKMBINDEX, TROPONINI in the last 168 hours. CBG: Recent Labs  Lab 07/30/17 1316 07/30/17 1653 07/30/17 2143 07/31/17 0755 07/31/17 1234  GLUCAP 95 213* 152* 115* 231*    Iron Studies: No  results for input(s): IRON, TIBC, TRANSFERRIN, FERRITIN in the last 72 hours. Studies/Results: Ct Extremity Lower Right Wo Contrast  Result Date: 07/29/2017 CLINICAL DATA:  Lower extremity cellulitis.  Penetrating trauma EXAM: CT OF THE LOWER RIGHT EXTREMITY WITHOUT CONTRAST TECHNIQUE: Multidetector CT imaging of the right lower extremity was performed. Today's exam from the iliac crest through the toes would typically require at least 3 separate CT examinations as there are multiple regions encompassed, each with their own procedure code. There is no single defined CPT code aside from a CT angiogram runoff that includes the whole pelvis, thigh, leg, and foot. As a courtesy this single time, we are performing and reporting this as one CT scan. In the future, individual regions of the extremity need their own order, such as thigh, tibia/fibula, and foot. Reconstructions in this case are not ideal for assessment of individual structures such as joints, this is a large field survey and not a detailed assessment of any of the included joints. The knee is moderately flexed during imaging, resulting in non ideal axial images particularly through the calf. COMPARISON:  None. FINDINGS: Bones/Joint/Cartilage No osteomyelitis seen, low sensitivity in assessing the toes. No definite bony destructive findings. Mild osteoarthritis of the knee. Trace knee effusion. Ligaments Not assessed by CT. Muscles and Tendons No appreciable drainable abscess is  observed on today's noncontrast assessment. Soft tissues Subcutaneous edema most notable along the proximal calf, with a suggestion of some skin disruption posteriorly along the mid calf. I do not see a foreign body. There may be some very minimal subcutaneous gas adjacent to the skin disruption for example on image 258/4. Notable arterial atherosclerotic calcification. Mild subcutaneous edema tracking along the dorsum of the foot. IMPRESSION: 1. No findings of abscess or osteomyelitis. 2. Skin disruption along the posterior calf with some confluent regional subcutaneous edema potentially reflecting cellulitis. 3. Notable atherosclerotic calcification. 4. Dorsal subcutaneous edema in the foot, nonspecific. Electronically Signed   By: Van Clines M.D.   On: 07/29/2017 17:07   . acetaminophen  650 mg Oral Q6H  . aspirin EC  81 mg Oral Daily  . atorvastatin  80 mg Oral QHS  . carvedilol  3.125 mg Oral BID WC  . Chlorhexidine Gluconate Cloth  6 each Topical Q0600  . cinacalcet  30 mg Oral Q supper  . clopidogrel  75 mg Oral Daily  . darbepoetin (ARANESP) injection - DIALYSIS  60 mcg Intravenous Q Tue-HD  . gabapentin  300 mg Oral QHS  . heparin  5,000 Units Subcutaneous Q8H  . insulin aspart  0-15 Units Subcutaneous TID WC  . insulin aspart  0-5 Units Subcutaneous QHS  . isosorbide-hydrALAZINE  1 tablet Oral BID  . multivitamin  1 tablet Oral QHS  . primidone  100 mg Oral Daily  . sevelamer carbonate  2,400 mg Oral TID WC  . vitamin B-12  1,000 mcg Oral Daily    BMET    Component Value Date/Time   NA 132 (L) 07/31/2017 0555   NA 144 11/01/2016 1532   K 3.8 07/31/2017 0555   CL 93 (L) 07/31/2017 0555   CO2 27 07/31/2017 0555   GLUCOSE 128 (H) 07/31/2017 0555   BUN 26 (H) 07/31/2017 0555   BUN 66 (H) 11/01/2016 1532   CREATININE 3.80 (H) 07/31/2017 0555   CALCIUM 7.7 (L) 07/31/2017 0555   CALCIUM 8.2 (L) 05/19/2017 0230   GFRNONAA 14 (L) 07/31/2017 0555   GFRAA 17 (L) 07/31/2017  0555   CBC  Component Value Date/Time   WBC 14.3 (H) 07/31/2017 0555   RBC 2.97 (L) 07/31/2017 0555   HGB 8.7 (L) 07/31/2017 0555   HGB 8.7 (L) 11/01/2016 1532   HCT 28.8 (L) 07/31/2017 0555   HCT 26.6 (L) 11/01/2016 1532   PLT 343 07/31/2017 0555   PLT 328 11/01/2016 1532   MCV 97.0 07/31/2017 0555   MCV 92 11/01/2016 1532   MCH 29.3 07/31/2017 0555   MCHC 30.2 07/31/2017 0555   RDW 15.6 (H) 07/31/2017 0555   RDW 15.3 11/01/2016 1532   LYMPHSABS 0.8 07/25/2017 1050   MONOABS 1.6 (H) 07/25/2017 1050   EOSABS 0.0 07/25/2017 1050   BASOSABS 0.0 07/25/2017 1050     Outpatient HD Prescription Valentino Nose ([phone] Extension [id number] Charge RN) TTS 3K2.5 Ca 138Na 3hrs 45 min 400/800 37 temp EDW 97 kg (usually gets there) "Prn challenge by 0.5 as tolerated" TDC 1.6 ml hep dwell  3000 bolus heparin, no maintenance 2 mcg Hectorol 2 mcg TTS(STOPPED DUE TO CONCERNS OF CALCIPHYLAXIS THIS ADMISSION) Ferrlecit 125 mg once a week No ESA   Assessment/ Plan:  1. Right leg ulceration and surrounding cellulitis- was suspicious for calciphylaxis when he was seen on 07/07/17 by Dr. Lorrene Reid, which was a little unusual as he was new to HD, however he does have an elevated phos and iPTH. His vitamin D was stopped during his last admission but it appears to still be on his medication list.  1. Appreciatesurgery consult for deep tissue biopsy to confirm dx of calciphylaxis. 2. haveincreasedrenvela to 2.4 grams per meal and addedsensiparwith marked improvement in phos.  3. Discontinuedcalcitriol and ergocalciferol.  4. Continue with lower calcium bath.  5. Will hold off on sodium thiosulfate for now and continue with abx and local wound care.  6. Vascular studiesreveal non-compressible arteries and arterial wall calcification so the pathogenesis of tissue injury from calciphylaxis is similar but involving smaller vessels. These ulcers could be related to his  vasculopathy and treatment would be similar, local wound care and phos and lipid control.  2. ESRD- Continue with HD q TTS. Cont on schedule 3. Hypertension/volume- stable 4. Anemia- was not on aranesp started this hospitalization and follow H/H and iron stores 5. Metabolic bone disease- better phos controlafter increasing renvela, adding sensipar, and stoppingvitamin D. His ulceration is suspicious for calciphylaxisbut also has known vascular calcifications of the large arteries of his legs.  Low iPTH of 78, will change sensipar to days of HD. 6. Nutrition- cont with renal diet, carbohydrate modified 7. Vascular access- RAVF maturing, RIJ TDC functioning well. 8. Right knee pain - appreciate Ortho input, unfortunately nothing surgical to improve his discomfort.  Remains main obstacle from standing/walking.  CT negative for osteo.  Combination of arthritis, cellulitis, and neuropathic pain. 9. Disposition- will need SNF placement due to severe deconditioning and inability to stand/walk.    Donetta Potts, MD [employer] [phone]

## 2017-08-01 DIAGNOSIS — Z872 Personal history of diseases of the skin and subcutaneous tissue: Secondary | ICD-10-CM

## 2017-08-01 DIAGNOSIS — Z992 Dependence on renal dialysis: Secondary | ICD-10-CM

## 2017-08-01 DIAGNOSIS — Z885 Allergy status to narcotic agent status: Secondary | ICD-10-CM

## 2017-08-01 DIAGNOSIS — Z8673 Personal history of transient ischemic attack (TIA), and cerebral infarction without residual deficits: Secondary | ICD-10-CM

## 2017-08-01 DIAGNOSIS — G8929 Other chronic pain: Secondary | ICD-10-CM

## 2017-08-01 DIAGNOSIS — I252 Old myocardial infarction: Secondary | ICD-10-CM

## 2017-08-01 DIAGNOSIS — N186 End stage renal disease: Secondary | ICD-10-CM

## 2017-08-01 DIAGNOSIS — I2510 Atherosclerotic heart disease of native coronary artery without angina pectoris: Secondary | ICD-10-CM

## 2017-08-01 DIAGNOSIS — D649 Anemia, unspecified: Secondary | ICD-10-CM

## 2017-08-01 DIAGNOSIS — I96 Gangrene, not elsewhere classified: Secondary | ICD-10-CM

## 2017-08-01 DIAGNOSIS — L989 Disorder of the skin and subcutaneous tissue, unspecified: Secondary | ICD-10-CM

## 2017-08-01 DIAGNOSIS — E1122 Type 2 diabetes mellitus with diabetic chronic kidney disease: Secondary | ICD-10-CM

## 2017-08-01 DIAGNOSIS — M25561 Pain in right knee: Secondary | ICD-10-CM

## 2017-08-01 DIAGNOSIS — Z888 Allergy status to other drugs, medicaments and biological substances status: Secondary | ICD-10-CM

## 2017-08-01 LAB — BASIC METABOLIC PANEL
Anion gap: 13 (ref 5–15)
BUN: 42 mg/dL — AB (ref 8–23)
CALCIUM: 7.9 mg/dL — AB (ref 8.9–10.3)
CO2: 25 mmol/L (ref 22–32)
Chloride: 91 mmol/L — ABNORMAL LOW (ref 98–111)
Creatinine, Ser: 5.21 mg/dL — ABNORMAL HIGH (ref 0.61–1.24)
GFR, EST AFRICAN AMERICAN: 11 mL/min — AB (ref >60)
GFR, EST NON AFRICAN AMERICAN: 10 mL/min — AB (ref >60)
Glucose, Bld: 141 mg/dL — ABNORMAL HIGH (ref 70–99)
Potassium: 4.2 mmol/L (ref 3.5–5.1)
SODIUM: 129 mmol/L — AB (ref 135–145)

## 2017-08-01 LAB — CBC
HCT: 28.5 % — ABNORMAL LOW (ref 39.0–52.0)
Hemoglobin: 8.7 g/dL — ABNORMAL LOW (ref 13.0–17.0)
MCH: 29.1 pg (ref 26.0–34.0)
MCHC: 30.5 g/dL (ref 30.0–36.0)
MCV: 95.3 fL (ref 78.0–100.0)
PLATELETS: 340 10*3/uL (ref 150–400)
RBC: 2.99 MIL/uL — AB (ref 4.22–5.81)
RDW: 15.5 % (ref 11.5–15.5)
WBC: 14.8 10*3/uL — AB (ref 4.0–10.5)

## 2017-08-01 LAB — GLUCOSE, CAPILLARY
GLUCOSE-CAPILLARY: 239 mg/dL — AB (ref 70–99)
GLUCOSE-CAPILLARY: 198 mg/dL — AB (ref 70–99)
GLUCOSE-CAPILLARY: 140 mg/dL — AB (ref 70–99)
Glucose-Capillary: 131 mg/dL — ABNORMAL HIGH (ref 70–99)

## 2017-08-01 LAB — HEMOGLOBIN A1C
HEMOGLOBIN A1C: 7.3 % — AB (ref 4.8–5.6)
MEAN PLASMA GLUCOSE: 162.81 mg/dL

## 2017-08-01 MED ORDER — DOCUSATE SODIUM 100 MG PO CAPS
100.0000 mg | ORAL_CAPSULE | Freq: Two times a day (BID) | ORAL | Status: DC | PRN
  Administered 2017-08-02: 100 mg via ORAL
  Filled 2017-08-01 (×2): qty 1

## 2017-08-01 MED ORDER — CHLORHEXIDINE GLUCONATE CLOTH 2 % EX PADS: 6.0000 | MEDICATED_PAD | Freq: Every day | CUTANEOUS | Status: DC

## 2017-08-01 NOTE — Progress Notes (Signed)
Physical Therapy Treatment Patient Details Name: Mitchell Garcia MRN: [id number] DOB: January 18, 1946 Today's Date: 08/01/2017    History of Present Illness Pt is a 72 y/o male admitted secondary to L LE cellulitis. PMH including but not limited to ESRD, DM, CHF, CAD s/p NSTEMI, CVA.    PT Comments    Pt was fatigued after several hours in the chair this AM, however, he was still able, with two person assist, to stand and pivot back to the bed.  He is very limited by right leg pain and ever progressing lack of knee ROM (35-85 flexion).  He grimaces in pain when assisted with ROM.  PT will continue to assist in safe mobility progression and as aggressive ROM as pt's pain will allow.    Follow Up Recommendations  SNF     Equipment Recommendations  None recommended by PT    Recommendations for Other Services   NA     Precautions / Restrictions Precautions Precautions: Fall Precaution Comments: right leg weakness at baseline due to old stroke.  Restrictions Weight Bearing Restrictions: No    Mobility  Bed Mobility Overal bed mobility: Needs Assistance Bed Mobility: Sit to Supine       Sit to supine: Mod assist   General bed mobility comments: Mod assist to help lift both legs back into bed from sitting.   Transfers Overall transfer level: Needs assistance Equipment used: Rolling walker (2 wheeled) Transfers: Sit to/from [facility] Sit to Stand: +2 physical assistance;Mod assist Stand pivot transfers: +2 physical assistance;Mod assist       General transfer comment: Two person mod assist to boost truk up to standing over weak and painful right leg.  Verbal cues for safe hand placement.  Pt pivoted to his left and mostly WB through his left foot as the right foot was too painful and flexed to take any weight.   Ambulation/Gait             General Gait Details: unable at this time.           Balance Overall balance assessment: Needs  assistance Sitting-balance support: Feet supported;Bilateral upper extremity supported Sitting balance-Leahy Scale: Fair     Standing balance support: Bilateral upper extremity supported Standing balance-Leahy Scale: Poor                              Cognition Arousal/Alertness: Awake/alert Behavior During Therapy: WFL for tasks assessed/performed Overall Cognitive Status: No family/caregiver present to determine baseline cognitive functioning                                 General Comments: Not specifically tested.        Exercises Other Exercises Other Exercises: AAROM at right ankle, knee flexion/hip flexion and prolonged gentle stretch into extension with pt supine in the bed.  At end of session pt's heel floated with knee blocked so that he could not externally rotate R hip and further flex right knee. Even at its best, he is missing ~35 degrees of knee extension and can only flex to ~85 degrees of flexion.          Pertinent Vitals/Pain Pain Assessment: Faces Faces Pain Scale: Hurts whole lot Pain Location: R LE with ROM or WB Pain Descriptors / Indicators: Grimacing;Guarding Pain Intervention(s): Limited activity within patient's tolerance;Monitored during session;Repositioned  PT Goals (current goals can now be found in the care plan section) Acute Rehab PT Goals Patient Stated Goal: decrease pain Progress towards PT goals: Progressing toward goals    Frequency    Min 2X/week      PT Plan Current plan remains appropriate;Frequency needs to be updated       AM-PAC PT "6 Clicks" Daily Activity  Outcome Measure  Difficulty turning over in bed (including adjusting bedclothes, sheets and blankets)?: Unable Difficulty moving from lying on back to sitting on the side of the bed? : Unable Difficulty sitting down on and standing up from a chair with arms (e.g., wheelchair, bedside commode, etc,.)?: Unable Help needed moving  to and from a bed to chair (including a wheelchair)?: A Lot Help needed walking in hospital room?: Total Help needed climbing 3-5 steps with a railing? : Total 6 Click Score: 7    End of Session   Activity Tolerance: Patient limited by pain Patient left: in bed;with call bell/phone within reach   PT Visit Diagnosis: Other abnormalities of gait and mobility (R26.89);Pain Pain - Right/Left: Right Pain - part of body: Leg;Knee     Time: 1445-1500 PT Time Calculation (min) (ACUTE ONLY): 15 min  Charges:  $Therapeutic Activity: 8-22 mins          Mitchell Garcia, [place], DPT [phone]             08/01/2017, 10:31 PM

## 2017-08-01 NOTE — Consult Note (Signed)
[facility]    Date of Admission:  07/25/2017   Total days of antibiotics: 8        Day 8 of Vancomycin         Day 2 of Cefazolin           Reason for Consult: Non-healing Cellulitis     Referring Provider: Dr. Chrisandra Netters MD Primary Care Provider: Dr. [name] MD  Assessment: Mitchell Garcia is a 72 y.o. male with a PMH of [id number] complicated by ESRD currently receiving hemodialysis, CAD with NSTEMI, CVA and anemia. He is currently presenting with multiple lower extremity lesions that are non-responsive to antibiotics.   Mitchell Garcia has had a chronic right lower extremity lesion for approximately 3 months with another forming on the other leg for the past one month. He has been afebrile for the duration of this admission. Mitchell Garcia may have had cellulitis in the past, however it is unlikely the cause at this time. Despite aggressive antibiotic treatment in the past month, including Vancomycin during hemodialysis and 3x 10 day courses of oral antibiotics prior to that, there has been no improvement in physical exam or symptoms. This is not typical for cellulitis, which generally responds quickly to treatment. Additionally, the multiple scattered red, slightly nodular lesions do not appear typical for cellulitis. Lastly, the development of a similar lesion on the other leg does not support cellulitis.  Due to suspicion of calciphylaxis, a punch biopsy was done on 7/05 with results still pending.   Plan: 1. Discontinue all antibiotics at this time.    Active Problems:   Cellulitis   Acute pain of right knee   Scheduled Meds: . acetaminophen  650 mg Oral Q6H  . aspirin EC  81 mg Oral Daily  . atorvastatin  80 mg Oral QHS  . Chlorhexidine Gluconate Cloth  6 each Topical Q0600  . [START ON 08/02/2017] cinacalcet  30 mg Oral Q T,Th,Sa-HD  . clopidogrel  75 mg Oral Daily  . darbepoetin (ARANESP) injection - DIALYSIS  60 mcg Intravenous Q  Tue-HD  . gabapentin  300 mg Oral QHS  . heparin  5,000 Units Subcutaneous Q8H  . insulin aspart  0-15 Units Subcutaneous TID WC  . insulin aspart  0-5 Units Subcutaneous QHS  . isosorbide-hydrALAZINE  1 tablet Oral BID  . multivitamin  1 tablet Oral QHS  . primidone  100 mg Oral Daily  . sevelamer carbonate  2,400 mg Oral TID WC  . vitamin B-12  1,000 mcg Oral Daily   Continuous Infusions: .  ceFAZolin (ANCEF) IV Stopped (07/31/17 2048)   PRN Meds:.ALPRAZolam, docusate sodium, heparin, lidocaine, menthol-cetylpyridinium, nitroGLYCERIN, ondansetron (ZOFRAN) IV, oxyCODONE, traMADol  HPI: Mitchell Garcia is a 72 y.o. male with a PMH of J6RC complicated by ESRD currently receiving hemodialysis, CAD with NSTEMI, CVA and anemia. He is currently presenting with complaints of non-healing lesions of the lower right leg. Mitchell Garcia states that the lesion first began approximately 3 months ago as a tiny brown dot on his posterior lower right calf that began to expand. Since then, he has experienced worsening pain and redness. He saw his outpatient PCP at the [facility] for this and received three separate 10 day doses of antibiotics. He does not feel as though this improved his symptoms at all. Afterwards, records show Mitchell Garcia received Vancomycin and Cefepime at hemodialysis for the past 8 sessions (approximately 2.5 weeks total).  He does not believe this helped either. Initial symptoms presented around the same time as beginning hemodialysis treatment. In the past one month, he notes that a similar lesion is appearing on the left posterior calf in approximately the same region as the other leg. He describes the area as beginning to redden and become painful. He denies trauma to the area or improvement with antibiotics during the past month.   Mitchell Garcia also complains of right knee pain that is worse with movement. Records show this pain is chronic in nature.   On 07/29/2017, Mitchell Garcia had a right lower  extremity that showed no findings of abscess or osteomyelitis, skin disruptions along the posterior calf with some confluent regional subcutaneous edema potentially reflecting cellulitis. Dorsal subcutaneous edema in the foot, nonspecific.  Patient also had a punch biopsy of the ulcer due to the suspicion of possible calciphylaxis. Results are still pending.    Review of Systems: Review of Systems  Musculoskeletal: Positive for joint pain (Right knee pain) and myalgias.  Skin:       Right lower leg redness, swelling and pain.  Left lower leg redness    Past Medical History:  Diagnosis Date  . Acute kidney injury superimposed on chronic kidney disease ([place]) 10/15/2016  . Altered mental state 01/25/2013  . Anemia of chronic disease 01/25/2013  . CAD S/P percutaneous coronary angioplasty    a.not to be a candidate for CABG. On 9/19, went back to cath lab for PCI s/p orbital atherectomy with DES to ostial RCA, and PTCA/DES to distal RCA. Residual 50% prox LAD, 70% D1, CTO mLCx noted by initial diagnostic cath 10/08/16 to be treated medically.  . Cancer ([place])   . Cellulitis 07/2017   right leg  . Cellulitis of right lower extremity 07/25/2017  . Chronic combined systolic and diastolic CHF (congestive heart failure) ([place])   . CKD (chronic kidney disease), stage IV ([place])   . Diabetes mellitus with renal complications (H[facility])    uncontrolled  . Essential hypertension 10/15/2016  . Humerus head fracture 01/27/2013  . Hyperlipidemia   . Hyponatremia 01/25/2013  . Insulin dependent diabetes mellitus ([place])   . Ischemic cardiomyopathy 10/15/2016  . MI (myocardial infarction) ([place])    years ago  . Stroke ([place])   . Transaminitis 01/25/2013  . Uncontrolled diabetes mellitus ([place]) 10/15/2016    Social History   Tobacco Use  . Smoking status: Never Smoker  . Smokeless tobacco: Never Used  Substance Use Topics  . Alcohol use: No  . Drug use: No    Family History  Problem Relation Age of Onset  .  Hyperlipidemia Father    Allergies  Allergen Reactions  . Oxycodone Other (See Comments)    DIFFICULT TO WAKE UP  . Tramadol Nausea And Vomiting  . Lisinopril Other (See Comments)    Hyperkalemia   . Losartan Other (See Comments)    Hyperkalemia     OBJECTIVE: Blood pressure 125/62, pulse (!) 58, temperature 97.7 F (36.5 C), temperature source Oral, resp. rate 20, height 5\' 11"  (1.803 m), weight 98.8 kg (217 lb 13 oz), SpO2 97 %.  Physical Exam  Neurological: He is alert.  Skin: Skin is warm and dry. Abrasion (Bilateral knees, healing abrasions. ) noted.  Right lower extremity: Multiple scattered, slightly raised nodules that are erythematous. Non-tender to palpation. Posterior aspect has a large 3x4cm black necrotic eschar with asymmetrical borders.   Left lower extremity: Distal posterior aspect, 1x1cm erythematous region that is non-raised  and without necrotic areas.     Lab Results Lab Results  Component Value Date   WBC 14.8 (H) 08/01/2017   HGB 8.7 (L) 08/01/2017   HCT 28.5 (L) 08/01/2017   MCV 95.3 08/01/2017   PLT 340 08/01/2017    Lab Results  Component Value Date   CREATININE 5.21 (H) 08/01/2017   BUN 42 (H) 08/01/2017   NA 129 (L) 08/01/2017   K 4.2 08/01/2017   CL 91 (L) 08/01/2017   CO2 25 08/01/2017    Lab Results  Component Value Date   ALT 22 07/25/2017   AST 21 07/25/2017   ALKPHOS 76 07/25/2017   BILITOT 0.5 07/25/2017     Microbiology: Recent Results (from the past 240 hour(s))  Blood culture (routine x 2)     Status: None   Collection Time: 07/25/17 12:30 PM  Result Value Ref Range Status   Specimen Description BLOOD LEFT ANTECUBITAL  Final   Special Requests   Final    BOTTLES DRAWN AEROBIC ONLY Blood Culture adequate volume   Culture   Final    NO GROWTH 5 DAYS Performed at [facility], [address] [place], [place] [address]    Report Status 07/30/2017 FINAL  Final  Blood culture (routine x 2)     Status: None    Collection Time: 07/25/17 12:38 PM  Result Value Ref Range Status   Specimen Description BLOOD SITE NOT SPECIFIED  Final   Special Requests   Final    BOTTLES DRAWN AEROBIC ONLY Blood Culture results may not be optimal due to an inadequate volume of blood received in culture bottles   Culture   Final    NO GROWTH 5 DAYS Performed at [facility], [place] [address], [place], [place] [address]    Report Status 07/30/2017 FINAL  Final  Culture, Urine     Status: Abnormal   Collection Time: 07/28/17  7:40 AM  Result Value Ref Range Status   Specimen Description URINE, RANDOM  Final   Special Requests   Final    NONE Performed at [facility], [place] [address] [place],  [address]    Culture MULTIPLE SPECIES PRESENT, SUGGEST RECOLLECTION (A)  Final   Report Status 07/29/2017 FINAL  Final    Jose Persia, [facility] for [place] [employer] [facility] [phone] pager   [phone] cell 08/01/2017, 4:46 PM

## 2017-08-01 NOTE — Progress Notes (Signed)
Inpatient Diabetes Program Recommendations  AACE/ADA: New Consensus Statement on Inpatient Glycemic Control (2019)  Target Ranges:  Prepandial:   less than 140 mg/dL      Peak postprandial:   less than 180 mg/dL (1-2 hours)      Critically ill patients:  140 - 180 mg/dL   Results for DAI, MCADAMS (MRN [id number]) as of 08/01/2017 11:07  Ref. Range 07/31/2017 07:55 07/31/2017 12:34 07/31/2017 16:43 07/31/2017 21:33 08/01/2017 08:45  Glucose-Capillary Latest Ref Range: 70 - 99 mg/dL 115 (H) 231 (H) 259 (H) 231 (H) 131 (H)   Review of Glycemic Control  Diabetes history: DM2 Outpatient Diabetes medications: 70/30 30-40 units BID Current orders for Inpatient glycemic control: Novolog 0-15 units TID with meals, Novolog 0-5 units QHS  Inpatient Diabetes Program Recommendations: Insulin - Meal Coverage: Please consider ordering Novolog 3 units TID with meals for meal coverage if patient eats at least 50% of meals.  Thanks, Barnie Alderman, RN, MSN, CDE Diabetes Coordinator Inpatient Diabetes Program [phone] (Team Pager from 8am to 5pm)

## 2017-08-01 NOTE — Progress Notes (Signed)
Subjective: Interval History: has complaints leg still not healing.  Objective: Vital signs in last 24 hours: Temp:  [97.5 F (36.4 C)-98.2 F (36.8 C)] 98.2 F (36.8 C) (07/08 0510) Pulse Rate:  [51-86] 86 (07/08 0832) Resp:  [16-20] 16 (07/08 0510) BP: (108-125)/(59-67) 125/67 (07/08 0832) SpO2:  [95 %-98 %] 95 % (07/08 0510) Weight change:   Intake/Output from previous day: 07/07 0701 - 07/08 0700 In: 50 [IV Piggyback:50] Out: 0  Intake/Output this shift: No intake/output data recorded.  General appearance: cooperative, no distress, mildly obese and pale Resp: diminished breath sounds bilaterally Chest wall: RIJ cath Cardio: S1, S2 normal and systolic murmur: systolic ejection 2/6, decrescendo at 2nd left intercostal space GI: obese,pos bs, liver down 5 cm Extremities: edema 2+ and AVF RFA, dressing R calf  Lab Results: Recent Labs    07/31/17 0555 08/01/17 0612  WBC 14.3* 14.8*  HGB 8.7* 8.7*  HCT 28.8* 28.5*  PLT 343 340   BMET:  Recent Labs    07/31/17 0555 08/01/17 0612  NA 132* 129*  K 3.8 4.2  CL 93* 91*  CO2 27 25  GLUCOSE 128* 141*  BUN 26* 42*  CREATININE 3.80* 5.21*  CALCIUM 7.7* 7.9*   No results for input(s): PTH in the last 72 hours. Iron Studies: No results for input(s): IRON, TIBC, TRANSFERRIN, FERRITIN in the last 72 hours.  Studies/Results: No results found.  I have reviewed the patient's current medications.  Assessment/Plan: 1 ESRD vol xs HD in am 2 Anemia esa 3 DM controlled 4 Leg ulcer with cellulitis on AB, bx pending ? Calciphylaxis 5 PVD 6 HPTH cinn P Hd, cinn, AB, bx     LOS: 7 days   Mitchell Garcia 08/01/2017,1:24 PM

## 2017-08-02 LAB — BASIC METABOLIC PANEL
Anion gap: 18 — ABNORMAL HIGH (ref 5–15)
BUN: 51 mg/dL — AB (ref 8–23)
CALCIUM: 7.8 mg/dL — AB (ref 8.9–10.3)
CHLORIDE: 88 mmol/L — AB (ref 98–111)
CO2: 18 mmol/L — ABNORMAL LOW (ref 22–32)
Creatinine, Ser: 6.14 mg/dL — ABNORMAL HIGH (ref 0.61–1.24)
GFR, EST AFRICAN AMERICAN: 9 mL/min — AB (ref >60)
GFR, EST NON AFRICAN AMERICAN: 8 mL/min — AB (ref >60)
Glucose, Bld: 115 mg/dL — ABNORMAL HIGH (ref 70–99)
Potassium: 4.3 mmol/L (ref 3.5–5.1)
SODIUM: 124 mmol/L — AB (ref 135–145)

## 2017-08-02 LAB — CBC
HCT: 26.6 % — ABNORMAL LOW (ref 39.0–52.0)
Hemoglobin: 8.5 g/dL — ABNORMAL LOW (ref 13.0–17.0)
MCH: 29.8 pg (ref 26.0–34.0)
MCHC: 32 g/dL (ref 30.0–36.0)
MCV: 93.3 fL (ref 78.0–100.0)
PLATELETS: 332 10*3/uL (ref 150–400)
RBC: 2.85 MIL/uL — ABNORMAL LOW (ref 4.22–5.81)
RDW: 15.2 % (ref 11.5–15.5)
WBC: 15.5 10*3/uL — AB (ref 4.0–10.5)

## 2017-08-02 LAB — RENAL FUNCTION PANEL
ALBUMIN: 2.2 g/dL — AB (ref 3.5–5.0)
Anion gap: 14 (ref 5–15)
BUN: 51 mg/dL — AB (ref 8–23)
CALCIUM: 7.9 mg/dL — AB (ref 8.9–10.3)
CHLORIDE: 88 mmol/L — AB (ref 98–111)
CO2: 24 mmol/L (ref 22–32)
CREATININE: 6.11 mg/dL — AB (ref 0.61–1.24)
GFR calc Af Amer: 9 mL/min — ABNORMAL LOW (ref >60)
GFR, EST NON AFRICAN AMERICAN: 8 mL/min — AB (ref >60)
Glucose, Bld: 113 mg/dL — ABNORMAL HIGH (ref 70–99)
PHOSPHORUS: 5.9 mg/dL — AB (ref 2.5–4.6)
Potassium: 4.4 mmol/L (ref 3.5–5.1)
SODIUM: 126 mmol/L — AB (ref 135–145)

## 2017-08-02 LAB — GLUCOSE, CAPILLARY
GLUCOSE-CAPILLARY: 91 mg/dL (ref 70–99)
GLUCOSE-CAPILLARY: 192 mg/dL — AB (ref 70–99)
Glucose-Capillary: 160 mg/dL — ABNORMAL HIGH (ref 70–99)

## 2017-08-02 LAB — OCCULT BLOOD X 1 CARD TO LAB, STOOL: Fecal Occult Bld: POSITIVE — AB

## 2017-08-02 MED ORDER — DARBEPOETIN ALFA 60 MCG/0.3ML IJ SOSY
PREFILLED_SYRINGE | INTRAMUSCULAR | Status: AC
  Administered 2017-08-02: 60 ug
  Filled 2017-08-02: qty 0.3

## 2017-08-02 MED ORDER — HYDROCORTISONE 0.5 % EX CREA
TOPICAL_CREAM | Freq: Two times a day (BID) | CUTANEOUS | Status: DC
  Administered 2017-08-02 – 2017-08-03 (×3): via TOPICAL
  Filled 2017-08-02: qty 28.35

## 2017-08-02 MED ORDER — TRAMADOL HCL 50 MG PO TABS
ORAL_TABLET | ORAL | Status: AC
  Administered 2017-08-02: 100 mg
  Filled 2017-08-02: qty 2

## 2017-08-02 MED ORDER — OXYCODONE HCL 5 MG PO TABS
ORAL_TABLET | ORAL | Status: AC
  Filled 2017-08-02: qty 1

## 2017-08-02 NOTE — Progress Notes (Signed)
Path report suggests ulcerative angiodermatitis.  Will reach out to Derm at [facility] for treatment assistance.  Bernita Raisin Rittberger, DO 2:40 PM

## 2017-08-02 NOTE — Progress Notes (Signed)
OT Cancellation Note  Patient Details Name: Mitchell Garcia MRN: [id number] DOB: 05-Sep-1945   Cancelled Treatment:    Reason Eval/Treat Not Completed: Patient at procedure or test/ unavailable(HD currently)  Parke Poisson B 08/02/2017, 8:14 AM

## 2017-08-02 NOTE — Social Work (Signed)
CSW continuing to follow. There has been some difficulty in finding SNFs that transport to HD at [facility].   CSW aware pt not medically ready for discharge, will continue to work to find placement if that remains medically appropriate.   Alexander Mt, [place] Work [phone]

## 2017-08-02 NOTE — Procedures (Signed)
I was present at this session.  I have reviewed the session itself and made appropriate changes.  HD via cath function poor.  bp ok. tol well  Mitchell Garcia 7/9/20198:38 AM

## 2017-08-02 NOTE — Progress Notes (Signed)
Subjective: Interval History: has no complaint .  Objective: Vital signs in last 24 hours: Temp:  [97.7 F (36.5 C)-98.6 F (37 C)] 98.6 F (37 C) (07/09 0730) Pulse Rate:  [58-86] 70 (07/09 0800) Resp:  [16-20] 16 (07/09 0730) BP: (118-136)/(51-69) 118/63 (07/09 0800) SpO2:  [94 %-97 %] 94 % (07/09 0730) Weight:  [101.6 kg (223 lb 15.8 oz)] 101.6 kg (223 lb 15.8 oz) (07/09 0730) Weight change:   Intake/Output from previous day: 07/08 0701 - 07/09 0700 In: -  Out: 400 [Urine:400] Intake/Output this shift: No intake/output data recorded.  General appearance: alert, cooperative, no distress, mildly obese and pale Resp: diminished breath sounds bilaterally Chest wall: RIJ PC Cardio: S1, S2 normal and systolic murmur: systolic ejection 2/6, decrescendo at 2nd left intercostal space GI: soft, non-tender; bowel sounds normal; no masses,  no organomegaly Extremities: BAndage R calf, erythema adj going into thigh  Lab Results: Recent Labs    08/01/17 0612 08/02/17 0638  WBC 14.8* 15.5*  HGB 8.7* 8.5*  HCT 28.5* 26.6*  PLT 340 332   BMET:  Recent Labs    08/01/17 0612 08/02/17 0638  NA 129* 124*  K 4.2 4.3  CL 91* 88*  CO2 25 18*  GLUCOSE 141* 115*  BUN 42* 51*  CREATININE 5.21* 6.14*  CALCIUM 7.9* 7.8*   No results for input(s): PTH in the last 72 hours. Iron Studies: No results for input(s): IRON, TIBC, TRANSFERRIN, FERRITIN in the last 72 hours.  Studies/Results: No results found.  I have reviewed the patient's current medications.  Assessment/Plan: 1 ESRD HD today 2 Leg ulcer bx pending, will look at Na thio but is not a great tx 3 Anemia stable 4 HPTH cinn 5 CAD 6 PVD P HD, esa, bx, cinn    LOS: 8 days   Jeneen Rinks Kamani Lewter 08/02/2017,8:31 AM

## 2017-08-02 NOTE — Progress Notes (Signed)
Family Medicine Teaching Service Daily Progress Note Intern Pager: [phone]  Patient name: Mitchell Garcia Medical record number: [id number] Date of birth: [birth year] Age: 72 y.o. Gender: male  Primary Care Provider: [name], MD Consultants: nephrology, orthopedics, wound care  Code Status: Full   Pt Overview and Major Events to Date:  7/1 - admitted to FPTS  Assessment and Plan: Mitchell Lage Parrishis a 72 y.o.malepresenting with cellulitis of leg. PMH is significant for ESRD on HD, T2DM, anemia, combined systolic/diastolic HF, CAD with history of NSTEMI, chronic leg wound  Cellulitis of right leg with non-healing ulcer  Possible component of calciphylaxis as well as infectious. X-rays of leg and knee reassuring, no bony abnormalities. ABI's with low normal flow to right leg. Patient with many risk factors for poor wound healing. Previously failed outpt tetracycline therapy for cellulitis. S/p vanc (7/1-7/5). Punch biopsy obtained on 07/30/17 by general surgery. On 7/7, area appeared to be worsening compared to borders and has spread to anterior portion of leg. ID called, recommended increased Strep coverage with Ancef and d/c vanc.  On 7/8, ID recommended stopped all antibiotics, as it is believed any secondary cellulitis has resolved and suspect this is calciphylaxis.  Leg appears to be stable from exam yesterday with patchy erythma on anterior shin.   - Clindamycin 300 mg PO (7/5-7/7), vancomycin (7/7), ancef (7/7-7/8 ) - not on Abx currently - appreciate ID recommendations - blood cx NGTD  - wound care consulted, appreciate recommendations: recommending daily dressing changes with aquacel ag moistened with NS covered w/ 4x4 and kerlix - nephro consulted, appreciate recs - sevelamir 2.4 grams/meal - cinacalcet 30 mg - holding: calcitriol, ergocalciferol, sodium thiosulfate - Pain control with Tramadol 100mg  Q6h prn - scheduled tylenol - monitor WBC trend - punch bx path  pending  Knee pain Pt has had ongoing knee pain since admission and continues to complain of knee pain. Per pt, pain is getting progressively worse.  Cellulitis does not extend to knee. And knee has a superficial abrasion on the medial aspect. CT or R lower extremity showing no findings of abscess or osteomyelitis. Patient states that ice packs only provided some relief.  Requests Bengay, patient educated that he has open wounds on his legs and the risk of it getting into this is too great.  Patient has oxycodone 2.5mg  q8hr prn that he is not maximizing.   -ortho consulted, appreciate recommendations: possible knee injection as outpatient, no surgical intervention needed -ice packs prn for knee pain - recommend use of oxycodone prn for pain  Klebsiella UTI Noted on outpatient records from 6/28, patient reports he makes very little urine, currently denies burning with urination. This does not appear to have been treated. UCX from 7/4 showing contaminant growth - pt continues to report pain with urination that began with his 'kidney problems' several months ago. No acute changes - presumed colonization with klebsiella, will not treat at this time  ESRD on HD- TTS schedule. - Nephrology following for HD - Avoid nephrotoxic agents. - Follow electrolytes daily - Strict I/O's - continue home renal meds  Poorly controlled T2DM Chronic. Last A1c 7.6 in 09/2016. AM CBG 113 - mSSIwith evening coverage - can add long acting insulin as needed - Diabetic/renal diet - measure A1C with next labs  Anemia of chronic disease Stable. baseline ~8 - continue to monitor - Darbepoetin 60 mcg started (7/4)  Combined HF Chronic. Most recent Echo 07/09/17 showing LVEF 40-45%. - Strict I/O's - Continue to monitor. -  continue home HF medications  CAD with history of NSTEMI Chronic. S/P Stent x2. Left Heart Cath 9/14 significant for multivessel disease with proximal LAD stenosis of 70%, total  occlusion of mid LCx, and 95% of RCA stenosis. On statin, Nitro, Coreg. - ASA and Plavix - continue home meds  Hyponatremia 135 on 7/5, has trended down to 129 on 7/8.  This AM 126. Patient ESRD on HD. - continue to monitor  FEN/GI:renal/carb modified diet Prophylaxis:on plavix, heparin subq  Disposition: continued inpatient stay for IV abx   Subjective:  Patient complaining of right knee pain, states that improved slightly with ice packs.  Denies other complaints.  No CP/SOB.  Objective: Temp:  [98 F (36.7 C)-98.6 F (37 C)] 98 F (36.7 C) (07/09 1413) Pulse Rate:  [60-79] 79 (07/09 1413) Resp:  [16-19] 18 (07/09 1413) BP: (118-151)/(51-81) 141/63 (07/09 1413) SpO2:  [94 %-97 %] 97 % (07/09 1413) Weight:  [215 lb 6.2 oz (97.7 kg)-223 lb 15.8 oz (101.6 kg)] 215 lb 6.2 oz (97.7 kg) (07/09 1150)    Physical Exam: General: 72 yo male, awake and alert, laying in bed, NAD, at dialysis Cardiovascular: RRR, 3/6 systolic murmur Respiratory: CTAB, no wheezes, rales, or rhonchi  Abdomen: Soft, non-tender, + bowel sounds  Extremities: 2+ pitting edema in Right foot, erythema within borders on posterior leg, splotchy erythema on anterior shin, mucopurulent and serosanguinous drainage on bandage, new bandage on RLE  Laboratory: Recent Labs  Lab 07/31/17 0555 08/01/17 0612 08/02/17 0638  WBC 14.3* 14.8* 15.5*  HGB 8.7* 8.7* 8.5*  HCT 28.8* 28.5* 26.6*  PLT 343 340 332   Recent Labs  Lab 08/01/17 0612 08/02/17 0638 08/02/17 0740  NA 129* 124* 126*  K 4.2 4.3 4.4  CL 91* 88* 88*  CO2 25 18* 24  BUN 42* 51* 51*  CREATININE 5.21* 6.14* 6.11*  CALCIUM 7.9* 7.8* 7.9*  GLUCOSE 141* 115* 113*     Imaging/Diagnostic Tests: Dg Chest 2 View  Result Date: 07/07/2017 CLINICAL DATA:  Syncope with fall this morning. Shortness of breath and dizziness. EXAM: CHEST - 2 VIEW COMPARISON:  05/26/2017 FINDINGS: Right IJ central venous catheter unchanged with tip over the SVC. Lungs  are adequately inflated without focal consolidation or effusion. Mild stable cardiomegaly. Remainder of the exam is unchanged. IMPRESSION: No acute findings. Stable cardiomegaly.  Right IJ central venous catheter unchanged. Electronically Signed   By: Marin Olp M.D.   On: 07/07/2017 19:45   Dg Knee 1-2 Views Right  Result Date: 07/25/2017 CLINICAL DATA:  Wound cellulitis. EXAM: RIGHT KNEE - 1-2 VIEW COMPARISON:  07/25/2017 FINDINGS: No joint effusion identified. Mild diffuse soft tissue swelling noted. Vascular calcifications are noted no fracture or dislocation identified. No focal bone erosions. IMPRESSION: No focal bone abnormality. Soft tissue swelling. Electronically Signed   By: Kerby Moors M.D.   On: 07/25/2017 17:26   Dg Tibia/fibula Right  Result Date: 07/25/2017 CLINICAL DATA:  Soft tissue wounds about the right knee and lower leg. EXAM: RIGHT TIBIA AND FIBULA - 2 VIEW COMPARISON:  None. FINDINGS: No acute bony or joint abnormality is identified. No soft tissue gas or radiopaque foreign body is seen. Atherosclerosis is noted. IMPRESSION: No acute abnormality. Atherosclerosis. Electronically Signed   By: Inge Rise M.D.   On: 07/25/2017 17:28   Ct Head Wo Contrast  Result Date: 07/07/2017 CLINICAL DATA:  Fall today, striking head/face on asphalt. Left facial pain. EXAM: CT HEAD WITHOUT CONTRAST CT CERVICAL SPINE WITHOUT CONTRAST  TECHNIQUE: Multidetector CT imaging of the head and cervical spine was performed following the standard protocol without intravenous contrast. Multiplanar CT image reconstructions of the cervical spine were also generated. COMPARISON:  CT head from 01/25/2013 FINDINGS: CT HEAD FINDINGS Brain: The brainstem, cerebellum, cerebral peduncles, thalami, basal ganglia, basilar cisterns, and ventricular system appear within normal limits. No intracranial hemorrhage, mass lesion, or acute CVA. Vascular: There is atherosclerotic calcification of the cavernous carotid  arteries bilaterally. Skull: Unremarkable Sinuses/Orbits: Chronic ethmoid sinusitis. Opacification of the visualized right maxillary sinus. Other: No supplemental non-categorized findings. CT CERVICAL SPINE FINDINGS Alignment: No vertebral subluxation is observed. Skull base and vertebrae: Degenerative endplate sclerosis at G4-0 with loss of disc height at C3-4, C5-6, C6-7, and C7-T1. Notable spurring in the cervical and thoracic spine. Soft tissues and spinal canal: Enlarged right thyroid lobe with a suspected 2.4 cm right thyroid nodule on image 80/5. Bilateral common carotid atherosclerotic calcification. Disc levels: There is suspected osseous foraminal narrowing on the left at C3-4, C4-5, C5-6, and C6-7; and on the right at C5-6. I cannot exclude a central disc protrusion at C5-6. Upper chest: Atherosclerotic calcification of the aortic arch and branch vessels. Right upper paratracheal lymph node borderline enlarged at 1.0 cm on image 89/5. Other: No supplemental non-categorized findings. IMPRESSION: 1. No acute intracranial findings or acute cervical spine findings. 2. 2.4 cm right thyroid lobe nodule. Consider further evaluation with thyroid ultrasound. If patient is clinically hyperthyroid, consider nuclear medicine thyroid uptake and scan. 3. Cervical spondylosis and degenerative disc disease with potential impingement at C3-4, C4-5, C5-6, and C6-7. 4. Atherosclerosis. 5. Upper normal sized right upper paratracheal lymph node, nonspecific. 6. Chronic ethmoid sinusitis. There is also opacification of visualized right maxillary sinus. Electronically Signed   By: Van Clines M.D.   On: 07/07/2017 13:13   Ct Cervical Spine Wo Contrast  Result Date: 07/07/2017 CLINICAL DATA:  Fall today, striking head/face on asphalt. Left facial pain. EXAM: CT HEAD WITHOUT CONTRAST CT CERVICAL SPINE WITHOUT CONTRAST TECHNIQUE: Multidetector CT imaging of the head and cervical spine was performed following the  standard protocol without intravenous contrast. Multiplanar CT image reconstructions of the cervical spine were also generated. COMPARISON:  CT head from 01/25/2013 FINDINGS: CT HEAD FINDINGS Brain: The brainstem, cerebellum, cerebral peduncles, thalami, basal ganglia, basilar cisterns, and ventricular system appear within normal limits. No intracranial hemorrhage, mass lesion, or acute CVA. Vascular: There is atherosclerotic calcification of the cavernous carotid arteries bilaterally. Skull: Unremarkable Sinuses/Orbits: Chronic ethmoid sinusitis. Opacification of the visualized right maxillary sinus. Other: No supplemental non-categorized findings. CT CERVICAL SPINE FINDINGS Alignment: No vertebral subluxation is observed. Skull base and vertebrae: Degenerative endplate sclerosis at N0-2 with loss of disc height at C3-4, C5-6, C6-7, and C7-T1. Notable spurring in the cervical and thoracic spine. Soft tissues and spinal canal: Enlarged right thyroid lobe with a suspected 2.4 cm right thyroid nodule on image 80/5. Bilateral common carotid atherosclerotic calcification. Disc levels: There is suspected osseous foraminal narrowing on the left at C3-4, C4-5, C5-6, and C6-7; and on the right at C5-6. I cannot exclude a central disc protrusion at C5-6. Upper chest: Atherosclerotic calcification of the aortic arch and branch vessels. Right upper paratracheal lymph node borderline enlarged at 1.0 cm on image 89/5. Other: No supplemental non-categorized findings. IMPRESSION: 1. No acute intracranial findings or acute cervical spine findings. 2. 2.4 cm right thyroid lobe nodule. Consider further evaluation with thyroid ultrasound. If patient is clinically hyperthyroid, consider nuclear medicine thyroid uptake and scan.  3. Cervical spondylosis and degenerative disc disease with potential impingement at C3-4, C4-5, C5-6, and C6-7. 4. Atherosclerosis. 5. Upper normal sized right upper paratracheal lymph node, nonspecific. 6.  Chronic ethmoid sinusitis. There is also opacification of visualized right maxillary sinus. Electronically Signed   By: Van Clines M.D.   On: 07/07/2017 13:13   Ct Extremity Lower Right Wo Contrast  Result Date: 07/29/2017 CLINICAL DATA:  Lower extremity cellulitis.  Penetrating trauma EXAM: CT OF THE LOWER RIGHT EXTREMITY WITHOUT CONTRAST TECHNIQUE: Multidetector CT imaging of the right lower extremity was performed. Today's exam from the iliac crest through the toes would typically require at least 3 separate CT examinations as there are multiple regions encompassed, each with their own procedure code. There is no single defined CPT code aside from a CT angiogram runoff that includes the whole pelvis, thigh, leg, and foot. As a courtesy this single time, we are performing and reporting this as one CT scan. In the future, individual regions of the extremity need their own order, such as thigh, tibia/fibula, and foot. Reconstructions in this case are not ideal for assessment of individual structures such as joints, this is a large field survey and not a detailed assessment of any of the included joints. The knee is moderately flexed during imaging, resulting in non ideal axial images particularly through the calf. COMPARISON:  None. FINDINGS: Bones/Joint/Cartilage No osteomyelitis seen, low sensitivity in assessing the toes. No definite bony destructive findings. Mild osteoarthritis of the knee. Trace knee effusion. Ligaments Not assessed by CT. Muscles and Tendons No appreciable drainable abscess is observed on today's noncontrast assessment. Soft tissues Subcutaneous edema most notable along the proximal calf, with a suggestion of some skin disruption posteriorly along the mid calf. I do not see a foreign body. There may be some very minimal subcutaneous gas adjacent to the skin disruption for example on image 258/4. Notable arterial atherosclerotic calcification. Mild subcutaneous edema tracking along  the dorsum of the foot. IMPRESSION: 1. No findings of abscess or osteomyelitis. 2. Skin disruption along the posterior calf with some confluent regional subcutaneous edema potentially reflecting cellulitis. 3. Notable atherosclerotic calcification. 4. Dorsal subcutaneous edema in the foot, nonspecific. Electronically Signed   By: Van Clines M.D.   On: 07/29/2017 17:07    Rittberger, Bernita Raisin, DO 08/02/2017, 2:39 PM PGY-1, [place] Intern pager: [phone], text pages welcome

## 2017-08-02 NOTE — Progress Notes (Signed)
Resident discussed Derm path result with me. See result below.  Likely related to stasis dermatitis. May benefit from topical steroid on non-ulcerated area. I recommended discussing with [facility] D[facility] provider on call for further recommendation. Dr. Criss Rosales stated that he paged them and left a message. Treat like stasis dermatitis. Patient will benefit from outpatient derm f/u at some point.  NB: I did not physically see or examine this patient.            Diagnosis Skin , right posterior lower leg ULCERATED ANGIODERMATITIS, SEE DESCRIPTION FOR FURTHER DISCUSSION Microscopic Comment There is an ulcerated area of angiodermatitis with scale. A small amount of epidermis at the edge of the biopsy is observed with reactive changes. There is a proliferation of vascular spaces with fibrosis and some edema. A mixed inflammatory cell infiltrate, including neutrophils, is observed within the fibrosing component. I do not see a pustular reaction. I see no abscess. I see no granulomatous disease. Although I see some fibrin deposition within the vessels, these are noted superficially and in close proximity to the ulcer. I do not see calcification but we do not have panniculus for evaluation. From this histology I favor an inflamed and ulcerated area of an angiodermatitis most likely related to stasis. I do not see a diffuse neutrophilic infiltrate away from the ulcer. From a histologic standpoint I do not see the hallmarks of calciphylaxis. This most likely represents an ulcerated stasis dermatitis but additional clinical correlation is suggested. If there are multiple areas of cutaneous involvement, additional biopsies may be beneficial as this could be a sampling issue. Multiple deeper levels have been examined and the findings are similar. (BJS:ecj 08/02/2017) The possiblility of infection is also considered and therefore PAS stains and Gam stains are examined.

## 2017-08-02 NOTE — Progress Notes (Signed)
[name], male   DOB: Mar 23, 1945, 72 y.o.   MRN: [id number]          [facility]    Date of Admission:  07/25/2017     Right calf ulcer biopsy  Microscopic Comment There is an ulcerated area of angiodermatitis with scale. A small amount of epidermis at the edge of the biopsy is observed with reactive changes. There is a proliferation of vascular spaces with fibrosis and some edema. A mixed inflammatory cell infiltrate, including neutrophils, is observed within the fibrosing component. I do not see a pustular reaction. I see no abscess. I see no granulomatous disease. Although I see some fibrin deposition within the vessels, these are noted superficially and in close proximity to the ulcer. I do not see calcification but we do not have panniculus for evaluation. From this histology I favor an inflamed and ulcerated area of an angiodermatitis most likely related to stasis. I do not see a diffuse neutrophilic infiltrate away from the ulcer.   From a histologic standpoint I do not see the hallmarks of calciphylaxis. This most likely represents an ulcerated stasis dermatitis but additional clinical correlation is suggested. If there are multiple areas of cutaneous involvement, additional biopsies may be beneficial as this could be a sampling issue. Multiple deeper levels have been examined and the findings are similar. (BJS:ecj 08/02/2017)   The possiblility of infection is also considered and therefore PAS stains and Gam stains are examined. They are negative for organisms. In light of the histologic findings in this material, I favor an ulcerated stasis dermatitis though trauma could result in this pattern in the appropriate setting. I do not see diagnostic evidence of calciphylaxis, but again sampling could be an issue and follow up with rebiopsy as needed is suggested. (BJS:ah 08/02/17)  Andria Frames MD Dermatopathologist, Electronic Signature (Case  signed 08/02/2017)  I agree with discussing the pathologic findings with dermatology.  I do not think that he has infectious cellulitis and I do not think that his right calf ulcer is secondarily infected at this time.  I favor continued observation off of antibiotics.  I will sign off now.           Michel Bickers, MD [facility] [place] [facility] [phone] pager   [phone] cell 08/02/2017, 3:22 PM

## 2017-08-02 NOTE — Progress Notes (Signed)
Spoke with Derm at [facility] continuing topical steroids to intact skin.  Compression can also be beneficial if patient is able to tolerate.  Recommend holding off on dapsone at this time.  Patient should follow up with Dermatology outpatient.  Will follow Derm recommendations and continue topical steroids.  Will assess in the AM patient's willingness to use compression in the area.  Bernita Raisin Rittberger, DO 5:55 PM

## 2017-08-02 NOTE — Progress Notes (Signed)
Paged my nurse for bloody bowel movement.  Patient had two BM's today with the second being bloody, with frank blood on tissue and bright red blood visualized in the stool.  Patient denies abdominal pain, rectal pain.  No dizziness.  Overall feels "fine."  He notes that he has had a bloody BM during his last hospitalization.  Never had a colonoscopy.  FOBT collected.    Patient examined with Nurse Vinnie Level as chaperone.  No external hemorrhoids visualized, no fissures, small amount of bloody stool residue, no open wounds.  Good rectal tone, no internal hemorrhoids palpated.  No frank blood on examining finger, only stool residue.  Will speak with GI in the AM to see if they would like to evaluate him in the hospital or as an outpatient.  Will recheck CBC in the AM as patient is asymptomatic and Hgb has been stable in the ~8.5.  If another bloody BM occurs tonight, will order CBC at that time.  Bernita Raisin Rittberger, DO 5:52 PM

## 2017-08-03 LAB — CBC
HCT: 28 % — ABNORMAL LOW (ref 39.0–52.0)
HEMOGLOBIN: 8.4 g/dL — AB (ref 13.0–17.0)
MCH: 28.3 pg (ref 26.0–34.0)
MCHC: 30 g/dL (ref 30.0–36.0)
MCV: 94.3 fL (ref 78.0–100.0)
Platelets: 360 10*3/uL (ref 150–400)
RBC: 2.97 MIL/uL — ABNORMAL LOW (ref 4.22–5.81)
RDW: 15.3 % (ref 11.5–15.5)
WBC: 15.1 10*3/uL — ABNORMAL HIGH (ref 4.0–10.5)

## 2017-08-03 LAB — GLUCOSE, CAPILLARY
GLUCOSE-CAPILLARY: 217 mg/dL — AB (ref 70–99)
GLUCOSE-CAPILLARY: 154 mg/dL — AB (ref 70–99)
GLUCOSE-CAPILLARY: 214 mg/dL — AB (ref 70–99)
Glucose-Capillary: 181 mg/dL — ABNORMAL HIGH (ref 70–99)

## 2017-08-03 LAB — BASIC METABOLIC PANEL
Anion gap: 13 (ref 5–15)
BUN: 24 mg/dL — AB (ref 8–23)
CHLORIDE: 92 mmol/L — AB (ref 98–111)
CO2: 25 mmol/L (ref 22–32)
Calcium: 7.4 mg/dL — ABNORMAL LOW (ref 8.9–10.3)
Creatinine, Ser: 3.95 mg/dL — ABNORMAL HIGH (ref 0.61–1.24)
GFR calc Af Amer: 16 mL/min — ABNORMAL LOW (ref >60)
GFR calc non Af Amer: 14 mL/min — ABNORMAL LOW (ref >60)
GLUCOSE: 143 mg/dL — AB (ref 70–99)
POTASSIUM: 3.6 mmol/L (ref 3.5–5.1)
Sodium: 130 mmol/L — ABNORMAL LOW (ref 135–145)

## 2017-08-03 LAB — HEMOGLOBIN AND HEMATOCRIT, BLOOD
HCT: 27.2 % — ABNORMAL LOW (ref 39.0–52.0)
Hemoglobin: 8.3 g/dL — ABNORMAL LOW (ref 13.0–17.0)

## 2017-08-03 MED ORDER — TRIAMCINOLONE ACETONIDE 0.1 % EX CREA
TOPICAL_CREAM | Freq: Two times a day (BID) | CUTANEOUS | Status: DC
  Administered 2017-08-03 – 2017-08-08 (×10): via TOPICAL
  Administered 2017-08-08: 1 via TOPICAL
  Administered 2017-08-09 – 2017-08-10 (×3): via TOPICAL
  Filled 2017-08-03 (×4): qty 15

## 2017-08-03 MED ORDER — OXYCODONE HCL 5 MG PO TABS
5.0000 mg | ORAL_TABLET | Freq: Three times a day (TID) | ORAL | Status: DC | PRN
  Administered 2017-08-03 – 2017-08-10 (×13): 5 mg via ORAL
  Filled 2017-08-03 (×14): qty 1

## 2017-08-03 MED ORDER — CLOPIDOGREL BISULFATE 75 MG PO TABS
75.0000 mg | ORAL_TABLET | Freq: Every day | ORAL | Status: DC
  Administered 2017-08-03: 75 mg via ORAL
  Filled 2017-08-03: qty 1

## 2017-08-03 NOTE — Progress Notes (Signed)
  [facility] Progress Note    Assessment/ Plan:   1 ESRD  - No acute indication for dialysis -> plan on HD in the AM to resume TTS regimen. 2 Leg ulcer bx - fortunately not calciphylaxis. 3 Anemia stable 4 HPTH cinn 5 CAD 6 PVD    Subjective:   C/o rt leg pain and difficulty walking. Denies f/c/n/v/dyspnea.   Objective:   BP 107/80 (BP Location: Left Arm)   Pulse 69   Temp 97.9 F (36.6 C) (Oral)   Resp 16   Ht 5\' 11"  (1.803 m)   Wt 97.7 kg (215 lb 6.2 oz)   SpO2 95%   BMI 30.04 kg/m   Intake/Output Summary (Last 24 hours) at 08/03/2017 1537 Last data filed at 08/03/2017 1521 Gross per 24 hour  Intake 300 ml  Output -  Net 300 ml   Weight change:   Physical Exam: General appearance: alert, cooperative, no distress, mildly obese and pale Resp: diminished breath sounds bilaterally Chest wall: RIJ PC Cardio: S1, S2 normal and systolic murmur: systolic ejection 2/6, decrescendo at 2nd left intercostal space GI: soft, non-tender; bowel sounds normal; no masses,  no organomegaly Extremities: BAndage R calf, erythema adj going into thigh Access: rt cimino with good bruit (inflow lesion)    Imaging: No results found.  Labs: BMET Recent Labs  Lab 07/28/17 2000 07/29/17 0532 07/30/17 0950 07/31/17 0555 08/01/17 0612 08/02/17 0638 08/02/17 0740 08/03/17 0335  NA 125* 135  135 130* 132* 129* 124* 126* 130*  K 4.4 4.5  4.5 4.2 3.8 4.2 4.3 4.4 3.6  CL 86* 95*  94* 89* 93* 91* 88* 88* 92*  CO2 23 25  24 25 27 25  18* 24 25  GLUCOSE 172* 161*  161* 147* 128* 141* 115* 113* 143*  BUN 66* 29*  28* 48* 26* 42* 51* 51* 24*  CREATININE 6.14* 3.84*  3.87* 5.40* 3.80* 5.21* 6.14* 6.11* 3.95*  CALCIUM 7.9* 7.8*  7.8* 7.6* 7.7* 7.9* 7.8* 7.9* 7.4*  PHOS 6.8* 3.9 5.0*  --   --   --  5.9*  --    CBC Recent Labs  Lab 07/31/17 0555 08/01/17 0612 08/02/17 0638 08/03/17 0335  WBC 14.3* 14.8* 15.5* 15.1*  HGB 8.7* 8.7* 8.5* 8.4*  HCT 28.8*  28.5* 26.6* 28.0*  MCV 97.0 95.3 93.3 94.3  PLT 343 340 332 360    Medications:    . acetaminophen  650 mg Oral Q6H  . aspirin EC  81 mg Oral Daily  . atorvastatin  80 mg Oral QHS  . Chlorhexidine Gluconate Cloth  6 each Topical Q0600  . cinacalcet  30 mg Oral Q T,Th,Sa-HD  . clopidogrel  75 mg Oral Daily  . darbepoetin (ARANESP) injection - DIALYSIS  60 mcg Intravenous Q Tue-HD  . gabapentin  300 mg Oral QHS  . heparin  5,000 Units Subcutaneous Q8H  . insulin aspart  0-15 Units Subcutaneous TID WC  . insulin aspart  0-5 Units Subcutaneous QHS  . isosorbide-hydrALAZINE  1 tablet Oral BID  . multivitamin  1 tablet Oral QHS  . primidone  100 mg Oral Daily  . sevelamer carbonate  2,400 mg Oral TID WC  . triamcinolone cream   Topical BID  . vitamin B-12  1,000 mcg Oral Daily      Otelia Santee, MD 08/03/2017, 3:37 PM

## 2017-08-03 NOTE — Progress Notes (Signed)
Occupational Therapy Treatment Patient Details Name: Mitchell Garcia MRN: [id number] DOB: [birth year] Today's Date: 08/03/2017    History of present illness Pt is a 72 y/o male admitted secondary to L LE cellulitis. PMH including but not limited to ESRD, DM, CHF, CAD s/p NSTEMI, CVA.   OT comments  Focus of session on UB strengthening through chair exercises. Pt declining standing due to pain, but tolerated positioning on R LE in more neutral position.  Follow Up Recommendations  SNF;Supervision/Assistance - 24 hour    Equipment Recommendations       Recommendations for Other Services      Precautions / Restrictions Precautions Precautions: Fall Precaution Comments: R LE pain, limited knee ROM       Mobility Bed Mobility                  Transfers                 General transfer comment: pt likely able to stand with 2 person assist, but fearful of pain and falling    Balance     Sitting balance-Leahy Scale: Fair         Standing balance comment: refused to attempt                           ADL either performed or assessed with clinical judgement   ADL                                               Vision       Perception     Praxis      Cognition Arousal/Alertness: Awake/alert Behavior During Therapy: WFL for tasks assessed/performed Overall Cognitive Status: Impaired/Different from baseline Area of Impairment: Memory                     Memory: Decreased short-term memory         General Comments: poor insight        Exercises Exercises: Other exercises Other Exercises Other Exercises: chair push ups with assist x 5   Shoulder Instructions       General Comments      Pertinent Vitals/ Pain       Pain Assessment: Faces Faces Pain Scale: Hurts whole lot Pain Location: R LE with ROM or WB Pain Descriptors / Indicators: Grimacing;Guarding Pain Intervention(s):  Repositioned;Monitored during session;Limited activity within patient's tolerance  Home Living                                          Prior Functioning/Environment              Frequency  Min 2X/week        Progress Toward Goals  OT Goals(current goals can now be found in the care plan section)  Progress towards OT goals: Not progressing toward goals - comment(R knee pain)  Acute Rehab OT Goals Patient Stated Goal: decrease pain OT Goal Formulation: With patient Time For Goal Achievement: 08/10/17 Potential to Achieve Goals: Good  Plan Discharge plan remains appropriate    Co-evaluation                 AM-PAC PT "6 Clicks" Daily  Activity     Outcome Measure   Help from another person eating meals?: None Help from another person taking care of personal grooming?: A Little Help from another person toileting, which includes using toliet, bedpan, or urinal?: Total Help from another person bathing (including washing, rinsing, drying)?: A Lot Help from another person to put on and taking off regular upper body clothing?: A Little Help from another person to put on and taking off regular lower body clothing?: Total 6 Click Score: 14    End of Session    OT Visit Diagnosis: Unsteadiness on feet (R26.81);Pain;Muscle weakness (generalized) (M62.81);Other symptoms and signs involving cognitive function Pain - Right/Left: Right Pain - part of body: Leg   Activity Tolerance Patient limited by pain   Patient Left in chair;with call bell/phone within reach;with family/visitor present   Nurse Communication          Time: 1610-9604 OT Time Calculation (min): 22 min  Charges: OT General Charges $OT Visit: 1 Visit OT Treatments $Therapeutic Activity: 8-22 mins  08/03/2017 Nestor Lewandowsky, OTR/L Pager: [phone]   Werner Lean Haze Boyden 08/03/2017, 12:34 PM

## 2017-08-03 NOTE — Progress Notes (Signed)
Paged by nurse as patient has had another bloody BM.  Continues to deny any abdominal pain, rectal pain, weight loss, loss of appetite, night sweats.  Stat H&H ordered.  Hgb 8.3, was 8.4 this AM.  Patient is stable and asymptomatic.  Paged GI, they will come see him in the morning.  Bernita Raisin Rittberger, DO 4:16 PM

## 2017-08-03 NOTE — Progress Notes (Signed)
Family Medicine Teaching Service Daily Progress Note Intern Pager: [phone]  Patient name: Mitchell Garcia Medical record number: [id number] Date of birth: [birth year] Age: 72 y.o. Gender: male  Primary Care Provider: [name], MD Consultants: nephrology, orthopedics, wound care  Code Status: Full   Pt Overview and Major Events to Date:  7/1 - admitted to FPTS  Assessment and Plan: Delray Mitchell Parrishis a 73 y.o.malepresenting with cellulitis of leg. PMH is significant for ESRD on HD, T2DM, anemia, combined systolic/diastolic HF, CAD with history of NSTEMI, chronic leg wound  Cellulitis of right leg with non-healing ulcer  Possible component of calciphylaxis as well as infectious. X-rays of leg and knee reassuring, no bony abnormalities. ABI's with low normal flow to right leg. Patient with many risk factors for poor wound healing. Previously failed outpt tetracycline therapy for cellulitis. S/p vanc (7/1-7/5). Punch biopsy obtained on 07/30/17 by general surgery. On 7/7, area appeared to be worsening compared to borders and has spread to anterior portion of leg. ID called, recommended increased Strep coverage with Ancef and d/c vanc.  On 7/8, ID recommended stopped all antibiotics, as it is believed any secondary cellulitis has resolved. 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  Derm consult recommends topical steroid cream to intact leg and follow up outpatient.  Patient states that he has seen Derm at the [place] in the past.  Leg appears stable today, redness within skin marker drawings, no increased warmth.  New ulceration noted on upper, lateral right lower leg. - Clindamycin 300 mg PO (7/5-7/7), vancomycin (7/7), ancef (7/7-7/8 ) - not on Abx currently - appreciate ID recommendations - appreciate Derm recommendations - [facility] - cont topical steroid cream to intact skin - blood cx NGTD  - wound care consulted, appreciate recommendations: recommending  daily dressing changes with aquacel ag moistened with NS covered w/ 4x4 and kerlix - nephro consulted, appreciate recs - sevelamir 2.4 grams/meal - cinacalcet 30 mg - holding: calcitriol, ergocalciferol, sodium thiosulfate - Pain control with Tramadol 100mg  Q6h prn - scheduled tylenol - monitor WBC trend - patient is medically cleared for d/c when SNF placement available - call patient's nurse to reiterate that topical steroid should be applied to all intact areas on the lower leg that appear to be involved, not just around the principle lesion  Rectal Bleeding: Patient had one episode of BRB per rectum on tissue when wiped and throughout stool on 7/9.  Patient denied any pain in abdomen or rectum.  States that he has a history of hernias and has had a similar episode during a previous hospitalization.  Denies weight loss, loss of appetite, night sweats.  Rectal exam showed no frank blood, no external hemorhoids or fissures, no internal hemorrhoids palpated. Patient notes he has never had a colonoscopy.  Hgb 8.4 on 7/10.  7/10 afternoon, patient had another bloody BM.  Stat H&H, Hgb was 8.3.  Overnight, patient had another bloody BM, Hgb dropped to 6.6.  Will transfuse 2 packs RBC.  GI consulted and agreed to see him this AM. - stable - 2 units pRBC - repeat H&H -f/u GI recommendations, appreciate consultation - holding plavix, ASA, lovenox - started SCD on left leg  Knee pain Pt has had ongoing knee pain since admission and continues to complain of knee pain. Per pt, pain is getting progressively worse.  Cellulitis does not extend to knee. And knee has a superficial abrasion on the medial aspect. CT of R lower extremity showing no  findings of abscess or osteomyelitis. X-ray also negative.  Ortho consult recommended no surgical intervention, but could consider steroid injection as an outpatient.  Patient states that ice packs only provided some relief.  Requests [place], patient educated that he  has open wounds on his legs and the risk of it getting into this is too great.  Patient's oxycodone increased from 2.5mg  to 5mg  on 7/10 for knee pain.  Improving pain and ROM improving. -ortho consulted, appreciate recommendations: possible knee injection as outpatient, no surgical intervention needed - continue oxycodone 5mg  q8hrs prn for pain -ice packs prn for knee pain   Klebsiella UTI Noted on outpatient records from 6/28, patient reports he makes very little urine, currently denies burning with urination. This does not appear to have been treated. UCX from 7/4 showing contaminant growth - pt continues to report pain with urination that began with his 'kidney problems' several months ago. No acute changes - presumed colonization with klebsiella, will not treat at this time  ESRD on HD- TTS schedule. - Nephrology following for HD - Avoid nephrotoxic agents. - Follow electrolytes daily - Strict I/O's - continue home renal meds  Poorly controlled T2DM Chronic. Last A1c 7.3 on 7/8. AM CBG 208 - mSSIwith evening coverage - can add long acting insulin as needed - Diabetic/renal diet   Anemia of chronic disease Stable. baseline ~8 - continue to monitor - Darbepoetin 60 mcg started (7/4)  Combined HF Chronic. Most recent Echo 07/09/17 showing LVEF 40-45%. - Strict I/O's - Continue to monitor. - continue home HF medications  CAD with history of NSTEMI Chronic. S/P Stent x2. Left Heart Cath 9/14 significant for multivessel disease with proximal LAD stenosis of 70%, total occlusion of mid LCx, and 95% of RCA stenosis. On statin, Nitro, Coreg. - ASA and Plavix - continue home meds  Hyponatremia 135 on 7/5, has trended down to 129 on 7/8.  This AM 131. Patient ESRD on HD. - continue to monitor    FEN/GI:renal/carb modified diet Prophylaxis:SCD on left leg  Disposition: SNF   Subjective:  Patient states that knee pain is improving.  Continues to deny  dizziness, SOB, CP, abdominal pain.  Objective: Temp:  [97.9 F (36.6 C)-98.6 F (37 C)] 98.6 F (37 C) (07/11 0439) Pulse Rate:  [69-75] 75 (07/11 0439) Resp:  [18-20] 18 (07/11 0439) BP: (107-125)/(60-80) 111/61 (07/11 0439) SpO2:  [95 %-99 %] 98 % (07/11 0439)    Physical Exam: General: 72 yo male, awake and alert, laying in bed, NAD, at dialysis Cardiovascular: RRR, 3/6 systolic murmur Respiratory: CTAB, no wheezes, rales, or rhonchi  Abdomen: Soft, non-tender, + bowel sounds  Extremities: No tenderness to palpation of R knee, 1+ pitting edema RLE, redness within borders of skin drawing on posterior leg, wound wrapped, new ulceration on lateral, lower leg slightly inferior to the knee, not open or draining  Laboratory: Recent Labs  Lab 08/02/17 0638 08/03/17 0335 08/03/17 1458 08/04/17 0406  WBC 15.5* 15.1*  --  14.2*  HGB 8.5* 8.4* 8.3* 6.6*  HCT 26.6* 28.0* 27.2* 21.4*  PLT 332 360  --  370   Recent Labs  Lab 08/02/17 0740 08/03/17 0335 08/04/17 0406  NA 126* 130* 131*  K 4.4 3.6 3.8  CL 88* 92* 91*  CO2 24 25 28   BUN 51* 24* 32*  CREATININE 6.11* 3.95* 5.31*  CALCIUM 7.9* 7.4* 7.2*  GLUCOSE 113* 143* 208*     Imaging/Diagnostic Tests: Dg Chest 2 View  Result Date: 07/07/2017  CLINICAL DATA:  Syncope with fall this morning. Shortness of breath and dizziness. EXAM: CHEST - 2 VIEW COMPARISON:  05/26/2017 FINDINGS: Right IJ central venous catheter unchanged with tip over the SVC. Lungs are adequately inflated without focal consolidation or effusion. Mild stable cardiomegaly. Remainder of the exam is unchanged. IMPRESSION: No acute findings. Stable cardiomegaly.  Right IJ central venous catheter unchanged. Electronically Signed   By: Marin Olp M.D.   On: 07/07/2017 19:45   Dg Knee 1-2 Views Right  Result Date: 07/25/2017 CLINICAL DATA:  Wound cellulitis. EXAM: RIGHT KNEE - 1-2 VIEW COMPARISON:  07/25/2017 FINDINGS: No joint effusion identified. Mild diffuse  soft tissue swelling noted. Vascular calcifications are noted no fracture or dislocation identified. No focal bone erosions. IMPRESSION: No focal bone abnormality. Soft tissue swelling. Electronically Signed   By: Kerby Moors M.D.   On: 07/25/2017 17:26   Dg Tibia/fibula Right  Result Date: 07/25/2017 CLINICAL DATA:  Soft tissue wounds about the right knee and lower leg. EXAM: RIGHT TIBIA AND FIBULA - 2 VIEW COMPARISON:  None. FINDINGS: No acute bony or joint abnormality is identified. No soft tissue gas or radiopaque foreign body is seen. Atherosclerosis is noted. IMPRESSION: No acute abnormality. Atherosclerosis. Electronically Signed   By: Inge Rise M.D.   On: 07/25/2017 17:28   Ct Head Wo Contrast  Result Date: 07/07/2017 CLINICAL DATA:  Fall today, striking head/face on asphalt. Left facial pain. EXAM: CT HEAD WITHOUT CONTRAST CT CERVICAL SPINE WITHOUT CONTRAST TECHNIQUE: Multidetector CT imaging of the head and cervical spine was performed following the standard protocol without intravenous contrast. Multiplanar CT image reconstructions of the cervical spine were also generated. COMPARISON:  CT head from 01/25/2013 FINDINGS: CT HEAD FINDINGS Brain: The brainstem, cerebellum, cerebral peduncles, thalami, basal ganglia, basilar cisterns, and ventricular system appear within normal limits. No intracranial hemorrhage, mass lesion, or acute CVA. Vascular: There is atherosclerotic calcification of the cavernous carotid arteries bilaterally. Skull: Unremarkable Sinuses/Orbits: Chronic ethmoid sinusitis. Opacification of the visualized right maxillary sinus. Other: No supplemental non-categorized findings. CT CERVICAL SPINE FINDINGS Alignment: No vertebral subluxation is observed. Skull base and vertebrae: Degenerative endplate sclerosis at Z6-1 with loss of disc height at C3-4, C5-6, C6-7, and C7-T1. Notable spurring in the cervical and thoracic spine. Soft tissues and spinal canal: Enlarged right  thyroid lobe with a suspected 2.4 cm right thyroid nodule on image 80/5. Bilateral common carotid atherosclerotic calcification. Disc levels: There is suspected osseous foraminal narrowing on the left at C3-4, C4-5, C5-6, and C6-7; and on the right at C5-6. I cannot exclude a central disc protrusion at C5-6. Upper chest: Atherosclerotic calcification of the aortic arch and branch vessels. Right upper paratracheal lymph node borderline enlarged at 1.0 cm on image 89/5. Other: No supplemental non-categorized findings. IMPRESSION: 1. No acute intracranial findings or acute cervical spine findings. 2. 2.4 cm right thyroid lobe nodule. Consider further evaluation with thyroid ultrasound. If patient is clinically hyperthyroid, consider nuclear medicine thyroid uptake and scan. 3. Cervical spondylosis and degenerative disc disease with potential impingement at C3-4, C4-5, C5-6, and C6-7. 4. Atherosclerosis. 5. Upper normal sized right upper paratracheal lymph node, nonspecific. 6. Chronic ethmoid sinusitis. There is also opacification of visualized right maxillary sinus. Electronically Signed   By: Van Clines M.D.   On: 07/07/2017 13:13   Ct Cervical Spine Wo Contrast  Result Date: 07/07/2017 CLINICAL DATA:  Fall today, striking head/face on asphalt. Left facial pain. EXAM: CT HEAD WITHOUT CONTRAST CT CERVICAL SPINE WITHOUT CONTRAST  TECHNIQUE: Multidetector CT imaging of the head and cervical spine was performed following the standard protocol without intravenous contrast. Multiplanar CT image reconstructions of the cervical spine were also generated. COMPARISON:  CT head from 01/25/2013 FINDINGS: CT HEAD FINDINGS Brain: The brainstem, cerebellum, cerebral peduncles, thalami, basal ganglia, basilar cisterns, and ventricular system appear within normal limits. No intracranial hemorrhage, mass lesion, or acute CVA. Vascular: There is atherosclerotic calcification of the cavernous carotid arteries bilaterally.  Skull: Unremarkable Sinuses/Orbits: Chronic ethmoid sinusitis. Opacification of the visualized right maxillary sinus. Other: No supplemental non-categorized findings. CT CERVICAL SPINE FINDINGS Alignment: No vertebral subluxation is observed. Skull base and vertebrae: Degenerative endplate sclerosis at B3-4 with loss of disc height at C3-4, C5-6, C6-7, and C7-T1. Notable spurring in the cervical and thoracic spine. Soft tissues and spinal canal: Enlarged right thyroid lobe with a suspected 2.4 cm right thyroid nodule on image 80/5. Bilateral common carotid atherosclerotic calcification. Disc levels: There is suspected osseous foraminal narrowing on the left at C3-4, C4-5, C5-6, and C6-7; and on the right at C5-6. I cannot exclude a central disc protrusion at C5-6. Upper chest: Atherosclerotic calcification of the aortic arch and branch vessels. Right upper paratracheal lymph node borderline enlarged at 1.0 cm on image 89/5. Other: No supplemental non-categorized findings. IMPRESSION: 1. No acute intracranial findings or acute cervical spine findings. 2. 2.4 cm right thyroid lobe nodule. Consider further evaluation with thyroid ultrasound. If patient is clinically hyperthyroid, consider nuclear medicine thyroid uptake and scan. 3. Cervical spondylosis and degenerative disc disease with potential impingement at C3-4, C4-5, C5-6, and C6-7. 4. Atherosclerosis. 5. Upper normal sized right upper paratracheal lymph node, nonspecific. 6. Chronic ethmoid sinusitis. There is also opacification of visualized right maxillary sinus. Electronically Signed   By: Van Clines M.D.   On: 07/07/2017 13:13   Ct Extremity Lower Right Wo Contrast  Result Date: 07/29/2017 CLINICAL DATA:  Lower extremity cellulitis.  Penetrating trauma EXAM: CT OF THE LOWER RIGHT EXTREMITY WITHOUT CONTRAST TECHNIQUE: Multidetector CT imaging of the right lower extremity was performed. Today's exam from the iliac crest through the toes would  typically require at least 3 separate CT examinations as there are multiple regions encompassed, each with their own procedure code. There is no single defined CPT code aside from a CT angiogram runoff that includes the whole pelvis, thigh, leg, and foot. As a courtesy this single time, we are performing and reporting this as one CT scan. In the future, individual regions of the extremity need their own order, such as thigh, tibia/fibula, and foot. Reconstructions in this case are not ideal for assessment of individual structures such as joints, this is a large field survey and not a detailed assessment of any of the included joints. The knee is moderately flexed during imaging, resulting in non ideal axial images particularly through the calf. COMPARISON:  None. FINDINGS: Bones/Joint/Cartilage No osteomyelitis seen, low sensitivity in assessing the toes. No definite bony destructive findings. Mild osteoarthritis of the knee. Trace knee effusion. Ligaments Not assessed by CT. Muscles and Tendons No appreciable drainable abscess is observed on today's noncontrast assessment. Soft tissues Subcutaneous edema most notable along the proximal calf, with a suggestion of some skin disruption posteriorly along the mid calf. I do not see a foreign body. There may be some very minimal subcutaneous gas adjacent to the skin disruption for example on image 258/4. Notable arterial atherosclerotic calcification. Mild subcutaneous edema tracking along the dorsum of the foot. IMPRESSION: 1. No findings of abscess  or osteomyelitis. 2. Skin disruption along the posterior calf with some confluent regional subcutaneous edema potentially reflecting cellulitis. 3. Notable atherosclerotic calcification. 4. Dorsal subcutaneous edema in the foot, nonspecific. Electronically Signed   By: Van Clines M.D.   On: 07/29/2017 17:07    Rittberger, Bernita Raisin, DO 08/04/2017, 7:48 AM PGY-1, [facility] Intern pager:  [phone], text pages welcome

## 2017-08-03 NOTE — Progress Notes (Signed)
Family Medicine Teaching Service Daily Progress Note Intern Pager: [phone]  Patient name: Mitchell Garcia Medical record number: [id number] Date of birth: Jan 06, 1946 Age: 72 y.o. Gender: male  Primary Care Provider: [name], MD Consultants: nephrology, orthopedics, wound care  Code Status: Full   Pt Overview and Major Events to Date:  7/1 - admitted to FPTS  Assessment and Plan: Mitchell Hartwell Parrishis a 72 y.o.malepresenting with cellulitis of leg. PMH is significant for ESRD on HD, T2DM, anemia, combined systolic/diastolic HF, CAD with history of NSTEMI, chronic leg wound  Cellulitis of right leg with non-healing ulcer  Possible component of calciphylaxis as well as infectious. X-rays of leg and knee reassuring, no bony abnormalities. ABI's with low normal flow to right leg. Patient with many risk factors for poor wound healing. Previously failed outpt tetracycline therapy for cellulitis. S/p vanc (7/1-7/5). Punch biopsy obtained on 07/30/17 by general surgery. On 7/7, area appeared to be worsening compared to borders and has spread to anterior portion of leg. ID called, recommended increased Strep coverage with Ancef and d/c vanc.  On 7/8, ID recommended stopped all antibiotics, as it is believed any secondary cellulitis has resolved. 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  Derm consult recommends topical steroid cream to intact leg and follow up outpatient.  Patient states that he has seen Derm at the [place] in the past.  Leg appears stable today, redness within skin marker drawings. - Clindamycin 300 mg PO (7/5-7/7), vancomycin (7/7), ancef (7/7-7/8 ) - not on Abx currently - appreciate ID recommendations - appreciate Derm recommendations - [facility] - cont topical steroid cream to intact skin - blood cx NGTD  - wound care consulted, appreciate recommendations: recommending daily dressing changes with aquacel ag moistened with NS covered w/ 4x4 and  kerlix - nephro consulted, appreciate recs - sevelamir 2.4 grams/meal - cinacalcet 30 mg - holding: calcitriol, ergocalciferol, sodium thiosulfate - Pain control with Tramadol 100mg  Q6h prn - scheduled tylenol - monitor WBC trend - patient is medically cleared for d/c when SNF placement available   Knee pain Pt has had ongoing knee pain since admission and continues to complain of knee pain. Per pt, pain is getting progressively worse.  Cellulitis does not extend to knee. And knee has a superficial abrasion on the medial aspect. CT of R lower extremity showing no findings of abscess or osteomyelitis. X-ray also negative.  Ortho consult recommended no surgical intervention, but could consider steroid injection as an outpatient.  Patient states that ice packs only provided some relief.  Requests [place], patient educated that he has open wounds on his legs and the risk of it getting into this is too great.  Patient has oxycodone 2.5mg  q8hr prn that he has been taking with no improvement in pain.   -ortho consulted, appreciate recommendations: possible knee injection as outpatient, no surgical intervention needed - increase oxycodone to 5mg  q8hr prn -ice packs prn for knee pain - recommend use of oxycodone prn for pain  Klebsiella UTI Noted on outpatient records from 6/28, patient reports he makes very little urine, currently denies burning with urination. This does not appear to have been treated. UCX from 7/4 showing contaminant growth - pt continues to report pain with urination that began with his 'kidney problems' several months ago. No acute changes - presumed colonization with klebsiella, will not treat at this time  ESRD on HD- TTS schedule. - Nephrology following for HD - Avoid nephrotoxic agents. - Follow electrolytes daily -  Strict I/O's - continue home renal meds  Poorly controlled T2DM Chronic. Last A1c 7.6 in 09/2016. AM CBG 113 - mSSIwith evening coverage - can add  long acting insulin as needed - Diabetic/renal diet - measure A1C with next labs  Anemia of chronic disease Stable. baseline ~8 - continue to monitor - Darbepoetin 60 mcg started (7/4)  Combined HF Chronic. Most recent Echo 07/09/17 showing LVEF 40-45%. - Strict I/O's - Continue to monitor. - continue home HF medications  CAD with history of NSTEMI Chronic. S/P Stent x2. Left Heart Cath 9/14 significant for multivessel disease with proximal LAD stenosis of 70%, total occlusion of mid LCx, and 95% of RCA stenosis. On statin, Nitro, Coreg. - ASA and Plavix - continue home meds  Hyponatremia 135 on 7/5, has trended down to 129 on 7/8.  This AM 130. Patient ESRD on HD. - continue to monitor  Rectal Bleeding: Patient had one episode of BRB per rectum on tissue when wiped and throughout stool on 7/9.  Patient denied any pain in abdomen or rectum.  States that he has a history of hernias and has had a similar episode during a previous hospitalization.  Rectal exam showed no frank blood, no external hemorhoids or fissures, no internal hemorrhoids palpated. Patient notes he has never had a colonoscopy.  Hgb 8.4 this AM, stable.  No further bloody BMs.  Patient remains asymptomatic.  Clopidogrel was held last PM.  GI recommends outpatient follow up at [facility] if patient is stable. - stable -f/u outpatient with GI at [facility]  FEN/GI:renal/carb modified diet Prophylaxis:on plavix, heparin subq  Disposition: SNF   Subjective:  Patient continues to complain of 10/10 knee pain.  He states that his pain medication does not help.  He requests an injection.  No other complaints.  Objective: Temp:  [98 F (36.7 C)-98.6 F (37 C)] 98.4 F (36.9 C) (07/10 0457) Pulse Rate:  [60-79] 75 (07/10 0457) Resp:  [16-18] 16 (07/10 0457) BP: (118-151)/(51-81) 133/66 (07/10 0457) SpO2:  [94 %-97 %] 95 % (07/10 0457) Weight:  [215 lb 6.2 oz (97.7 kg)-223 lb 15.8 oz (101.6 kg)] 215 lb 6.2 oz (97.7 kg)  (07/09 1150)    Physical Exam: General: 72 yo male, awake and alert, laying in bed, NAD, at dialysis Cardiovascular: RRR, 3/6 systolic murmur Respiratory: CTAB, no wheezes, rales, or rhonchi  Abdomen: Soft, non-tender, + bowel sounds  Extremities: Tenderness to palpation of R knee, held in flexed position, pain with extension, 1+ pitting edema RLE, redness within borders of skin drawing on posterior leg, wound wrapped  Laboratory: Recent Labs  Lab 08/01/17 0612 08/02/17 0638 08/03/17 0335  WBC 14.8* 15.5* 15.1*  HGB 8.7* 8.5* 8.4*  HCT 28.5* 26.6* 28.0*  PLT 340 332 360   Recent Labs  Lab 08/02/17 0638 08/02/17 0740 08/03/17 0335  NA 124* 126* 130*  K 4.3 4.4 3.6  CL 88* 88* 92*  CO2 18* 24 25  BUN 51* 51* 24*  CREATININE 6.14* 6.11* 3.95*  CALCIUM 7.8* 7.9* 7.4*  GLUCOSE 115* 113* 143*     Imaging/Diagnostic Tests: Dg Chest 2 View  Result Date: 07/07/2017 CLINICAL DATA:  Syncope with fall this morning. Shortness of breath and dizziness. EXAM: CHEST - 2 VIEW COMPARISON:  05/26/2017 FINDINGS: Right IJ central venous catheter unchanged with tip over the SVC. Lungs are adequately inflated without focal consolidation or effusion. Mild stable cardiomegaly. Remainder of the exam is unchanged. IMPRESSION: No acute findings. Stable cardiomegaly.  Right IJ central  venous catheter unchanged. Electronically Signed   By: Marin Olp M.D.   On: 07/07/2017 19:45   Dg Knee 1-2 Views Right  Result Date: 07/25/2017 CLINICAL DATA:  Wound cellulitis. EXAM: RIGHT KNEE - 1-2 VIEW COMPARISON:  07/25/2017 FINDINGS: No joint effusion identified. Mild diffuse soft tissue swelling noted. Vascular calcifications are noted no fracture or dislocation identified. No focal bone erosions. IMPRESSION: No focal bone abnormality. Soft tissue swelling. Electronically Signed   By: Kerby Moors M.D.   On: 07/25/2017 17:26   Dg Tibia/fibula Right  Result Date: 07/25/2017 CLINICAL DATA:  Soft tissue wounds  about the right knee and lower leg. EXAM: RIGHT TIBIA AND FIBULA - 2 VIEW COMPARISON:  None. FINDINGS: No acute bony or joint abnormality is identified. No soft tissue gas or radiopaque foreign body is seen. Atherosclerosis is noted. IMPRESSION: No acute abnormality. Atherosclerosis. Electronically Signed   By: Inge Rise M.D.   On: 07/25/2017 17:28   Ct Head Wo Contrast  Result Date: 07/07/2017 CLINICAL DATA:  Fall today, striking head/face on asphalt. Left facial pain. EXAM: CT HEAD WITHOUT CONTRAST CT CERVICAL SPINE WITHOUT CONTRAST TECHNIQUE: Multidetector CT imaging of the head and cervical spine was performed following the standard protocol without intravenous contrast. Multiplanar CT image reconstructions of the cervical spine were also generated. COMPARISON:  CT head from 01/25/2013 FINDINGS: CT HEAD FINDINGS Brain: The brainstem, cerebellum, cerebral peduncles, thalami, basal ganglia, basilar cisterns, and ventricular system appear within normal limits. No intracranial hemorrhage, mass lesion, or acute CVA. Vascular: There is atherosclerotic calcification of the cavernous carotid arteries bilaterally. Skull: Unremarkable Sinuses/Orbits: Chronic ethmoid sinusitis. Opacification of the visualized right maxillary sinus. Other: No supplemental non-categorized findings. CT CERVICAL SPINE FINDINGS Alignment: No vertebral subluxation is observed. Skull base and vertebrae: Degenerative endplate sclerosis at I2-0 with loss of disc height at C3-4, C5-6, C6-7, and C7-T1. Notable spurring in the cervical and thoracic spine. Soft tissues and spinal canal: Enlarged right thyroid lobe with a suspected 2.4 cm right thyroid nodule on image 80/5. Bilateral common carotid atherosclerotic calcification. Disc levels: There is suspected osseous foraminal narrowing on the left at C3-4, C4-5, C5-6, and C6-7; and on the right at C5-6. I cannot exclude a central disc protrusion at C5-6. Upper chest: Atherosclerotic  calcification of the aortic arch and branch vessels. Right upper paratracheal lymph node borderline enlarged at 1.0 cm on image 89/5. Other: No supplemental non-categorized findings. IMPRESSION: 1. No acute intracranial findings or acute cervical spine findings. 2. 2.4 cm right thyroid lobe nodule. Consider further evaluation with thyroid ultrasound. If patient is clinically hyperthyroid, consider nuclear medicine thyroid uptake and scan. 3. Cervical spondylosis and degenerative disc disease with potential impingement at C3-4, C4-5, C5-6, and C6-7. 4. Atherosclerosis. 5. Upper normal sized right upper paratracheal lymph node, nonspecific. 6. Chronic ethmoid sinusitis. There is also opacification of visualized right maxillary sinus. Electronically Signed   By: Van Clines M.D.   On: 07/07/2017 13:13   Ct Cervical Spine Wo Contrast  Result Date: 07/07/2017 CLINICAL DATA:  Fall today, striking head/face on asphalt. Left facial pain. EXAM: CT HEAD WITHOUT CONTRAST CT CERVICAL SPINE WITHOUT CONTRAST TECHNIQUE: Multidetector CT imaging of the head and cervical spine was performed following the standard protocol without intravenous contrast. Multiplanar CT image reconstructions of the cervical spine were also generated. COMPARISON:  CT head from 01/25/2013 FINDINGS: CT HEAD FINDINGS Brain: The brainstem, cerebellum, cerebral peduncles, thalami, basal ganglia, basilar cisterns, and ventricular system appear within normal limits. No intracranial hemorrhage,  mass lesion, or acute CVA. Vascular: There is atherosclerotic calcification of the cavernous carotid arteries bilaterally. Skull: Unremarkable Sinuses/Orbits: Chronic ethmoid sinusitis. Opacification of the visualized right maxillary sinus. Other: No supplemental non-categorized findings. CT CERVICAL SPINE FINDINGS Alignment: No vertebral subluxation is observed. Skull base and vertebrae: Degenerative endplate sclerosis at Q7-3 with loss of disc height at C3-4,  C5-6, C6-7, and C7-T1. Notable spurring in the cervical and thoracic spine. Soft tissues and spinal canal: Enlarged right thyroid lobe with a suspected 2.4 cm right thyroid nodule on image 80/5. Bilateral common carotid atherosclerotic calcification. Disc levels: There is suspected osseous foraminal narrowing on the left at C3-4, C4-5, C5-6, and C6-7; and on the right at C5-6. I cannot exclude a central disc protrusion at C5-6. Upper chest: Atherosclerotic calcification of the aortic arch and branch vessels. Right upper paratracheal lymph node borderline enlarged at 1.0 cm on image 89/5. Other: No supplemental non-categorized findings. IMPRESSION: 1. No acute intracranial findings or acute cervical spine findings. 2. 2.4 cm right thyroid lobe nodule. Consider further evaluation with thyroid ultrasound. If patient is clinically hyperthyroid, consider nuclear medicine thyroid uptake and scan. 3. Cervical spondylosis and degenerative disc disease with potential impingement at C3-4, C4-5, C5-6, and C6-7. 4. Atherosclerosis. 5. Upper normal sized right upper paratracheal lymph node, nonspecific. 6. Chronic ethmoid sinusitis. There is also opacification of visualized right maxillary sinus. Electronically Signed   By: Van Clines M.D.   On: 07/07/2017 13:13   Ct Extremity Lower Right Wo Contrast  Result Date: 07/29/2017 CLINICAL DATA:  Lower extremity cellulitis.  Penetrating trauma EXAM: CT OF THE LOWER RIGHT EXTREMITY WITHOUT CONTRAST TECHNIQUE: Multidetector CT imaging of the right lower extremity was performed. Today's exam from the iliac crest through the toes would typically require at least 3 separate CT examinations as there are multiple regions encompassed, each with their own procedure code. There is no single defined CPT code aside from a CT angiogram runoff that includes the whole pelvis, thigh, leg, and foot. As a courtesy this single time, we are performing and reporting this as one CT scan. In the  future, individual regions of the extremity need their own order, such as thigh, tibia/fibula, and foot. Reconstructions in this case are not ideal for assessment of individual structures such as joints, this is a large field survey and not a detailed assessment of any of the included joints. The knee is moderately flexed during imaging, resulting in non ideal axial images particularly through the calf. COMPARISON:  None. FINDINGS: Bones/Joint/Cartilage No osteomyelitis seen, low sensitivity in assessing the toes. No definite bony destructive findings. Mild osteoarthritis of the knee. Trace knee effusion. Ligaments Not assessed by CT. Muscles and Tendons No appreciable drainable abscess is observed on today's noncontrast assessment. Soft tissues Subcutaneous edema most notable along the proximal calf, with a suggestion of some skin disruption posteriorly along the mid calf. I do not see a foreign body. There may be some very minimal subcutaneous gas adjacent to the skin disruption for example on image 258/4. Notable arterial atherosclerotic calcification. Mild subcutaneous edema tracking along the dorsum of the foot. IMPRESSION: 1. No findings of abscess or osteomyelitis. 2. Skin disruption along the posterior calf with some confluent regional subcutaneous edema potentially reflecting cellulitis. 3. Notable atherosclerotic calcification. 4. Dorsal subcutaneous edema in the foot, nonspecific. Electronically Signed   By: Van Clines M.D.   On: 07/29/2017 17:07    Rittberger, Bernita Raisin, DO 08/03/2017, 6:33 AM PGY-1, [place] Intern  pager: [phone], text pages welcome

## 2017-08-03 NOTE — Progress Notes (Signed)
   08/03/17 1600  Clinical Encounter Type  Visited With Patient and family together  Visit Type Follow-up;Social support;Spiritual support  Referral From Social work  Consult/Referral To Chaplain  Spiritual Encounters  Spiritual Needs Literature  Stress Factors  Patient Stress Factors Health changes;Lack of knowledge  The initial call to the Chaplain was for an advanced directive for the PT.  In conversation with the Pt and wife it turns out their need was for the granddaughter to have access to PT medical information and not a HCPOA.  The PT wants to gain more access to their medical care to get a better understanding of the care needed.  Wife of PT does not like nursing homes and it may be inevitable at this point in PT condition.

## 2017-08-03 NOTE — Social Work (Signed)
CSW spoke with pt and pt wife extensively at bedside. Pt wife and pt still requesting SNF[facility]. CSW explained challenge and that pt may have to go to a SNF utilizing his [employer]. Pt and pt wife state understanding.   Pt and pt wife do not feel pt is stable for discharge, but CSW discussed with pt that the medical care team would discuss that with pt and pt family.  Continuing to follow.  Alexander Mt, [place] Work [phone]

## 2017-08-04 ENCOUNTER — Encounter (HOSPITAL_COMMUNITY): Payer: Self-pay | Admitting: Gastroenterology

## 2017-08-04 DIAGNOSIS — L309 Dermatitis, unspecified: Secondary | ICD-10-CM

## 2017-08-04 DIAGNOSIS — K625 Hemorrhage of anus and rectum: Secondary | ICD-10-CM

## 2017-08-04 LAB — HEMOGLOBIN AND HEMATOCRIT, BLOOD
HCT: 29.9 % — ABNORMAL LOW (ref 39.0–52.0)
Hemoglobin: 9.4 g/dL — ABNORMAL LOW (ref 13.0–17.0)

## 2017-08-04 LAB — CBC
HCT: 21.4 % — ABNORMAL LOW (ref 39.0–52.0)
Hemoglobin: 6.6 g/dL — CL (ref 13.0–17.0)
MCH: 29.1 pg (ref 26.0–34.0)
MCHC: 30.8 g/dL (ref 30.0–36.0)
MCV: 94.3 fL (ref 78.0–100.0)
Platelets: 370 10*3/uL (ref 150–400)
RBC: 2.27 MIL/uL — ABNORMAL LOW (ref 4.22–5.81)
RDW: 15.4 % (ref 11.5–15.5)
WBC: 14.2 10*3/uL — ABNORMAL HIGH (ref 4.0–10.5)

## 2017-08-04 LAB — BASIC METABOLIC PANEL
ANION GAP: 12 (ref 5–15)
BUN: 32 mg/dL — AB (ref 8–23)
CALCIUM: 7.2 mg/dL — AB (ref 8.9–10.3)
CO2: 28 mmol/L (ref 22–32)
CREATININE: 5.31 mg/dL — AB (ref 0.61–1.24)
Chloride: 91 mmol/L — ABNORMAL LOW (ref 98–111)
GFR calc Af Amer: 11 mL/min — ABNORMAL LOW (ref >60)
GFR, EST NON AFRICAN AMERICAN: 10 mL/min — AB (ref >60)
GLUCOSE: 208 mg/dL — AB (ref 70–99)
Potassium: 3.8 mmol/L (ref 3.5–5.1)
Sodium: 131 mmol/L — ABNORMAL LOW (ref 135–145)

## 2017-08-04 LAB — PREPARE RBC (CROSSMATCH)

## 2017-08-04 LAB — GLUCOSE, CAPILLARY
GLUCOSE-CAPILLARY: 152 mg/dL — AB (ref 70–99)
Glucose-Capillary: 166 mg/dL — ABNORMAL HIGH (ref 70–99)
Glucose-Capillary: 173 mg/dL — ABNORMAL HIGH (ref 70–99)

## 2017-08-04 MED ORDER — LIDOCAINE-PRILOCAINE 2.5-2.5 % EX CREA: 1.0000 "application " | TOPICAL_CREAM | CUTANEOUS | Status: DC | PRN

## 2017-08-04 MED ORDER — LIDOCAINE HCL (PF) 1 % IJ SOLN: 5.0000 mL | INTRAMUSCULAR | Status: DC | PRN

## 2017-08-04 MED ORDER — ALTEPLASE 2 MG IJ SOLR: 2.0000 mg | Freq: Once | INTRAMUSCULAR | Status: DC | PRN

## 2017-08-04 MED ORDER — HEPARIN SODIUM (PORCINE) 1000 UNIT/ML DIALYSIS: 100.0000 [IU]/kg | INTRAMUSCULAR | Status: DC | PRN

## 2017-08-04 MED ORDER — SODIUM CHLORIDE 0.9 % IV SOLN: 100.0000 mL | INTRAVENOUS | Status: DC | PRN

## 2017-08-04 MED ORDER — PENTAFLUOROPROP-TETRAFLUOROETH EX AERO: 1.0000 "application " | INHALATION_SPRAY | CUTANEOUS | Status: DC | PRN

## 2017-08-04 MED ORDER — SODIUM CHLORIDE 0.9% IV SOLUTION: Freq: Once | INTRAVENOUS | Status: DC

## 2017-08-04 MED ORDER — HEPARIN SODIUM (PORCINE) 1000 UNIT/ML DIALYSIS: 1000.0000 [IU] | INTRAMUSCULAR | Status: DC | PRN

## 2017-08-04 NOTE — Plan of Care (Signed)
  Problem: Coping: Goal: Level of anxiety will decrease Outcome: Progressing   Problem: Pain Managment: Goal: General experience of comfort will improve Outcome: Progressing   Problem: Safety: Goal: Ability to remain free from injury will improve Outcome: Progressing   

## 2017-08-04 NOTE — Progress Notes (Signed)
Inpatient Diabetes Program Recommendations  AACE/ADA: New Consensus Statement on Inpatient Glycemic Control (2019)  Target Ranges:  Prepandial:   less than 140 mg/dL      Peak postprandial:   less than 180 mg/dL (1-2 hours)      Critically ill patients:  140 - 180 mg/dL  Results for Mitchell Garcia, Mitchell Garcia (MRN [id number]) as of 08/04/2017 08:58  Ref. Range 08/04/2017 04:06  Glucose Latest Ref Range: 70 - 99 mg/dL 208 (H)   Results for Mitchell Garcia, Mitchell Garcia (MRN [id number]) as of 08/04/2017 08:58  Ref. Range 08/03/2017 08:01 08/03/2017 12:02 08/03/2017 17:06 08/03/2017 21:16  Glucose-Capillary Latest Ref Range: 70 - 99 mg/dL 217 (H) 181 (H) 154 (H) 214 (H)   Review of Glycemic Control  Diabetes history: DM2 Outpatient Diabetes medications: 70/30 30-40 units BID Current orders for Inpatient glycemic control: Novolog 0-15 units TID with meals, Novolog 0-5 units QHS  Inpatient Diabetes Program Recommendations: Insulin - Meal Coverage: Please consider ordering Novolog 3 units TID with meals for meal coverage if patient eats at least 50% of meals.  Thanks, Barnie Alderman, RN, MSN, CDE Diabetes Coordinator Inpatient Diabetes Program [phone] (Team Pager from 8am to 5pm)

## 2017-08-04 NOTE — Progress Notes (Signed)
PT Cancellation Note  Patient Details Name: Mitchell Garcia MRN: [id number] DOB: 1945-02-01   Cancelled Treatment:    Reason Eval/Treat Not Completed: Patient at procedure or test/unavailable. Pt off of the floor for HD. PT will continue to follow acutely.    [place] 08/04/2017, 8:22 AM

## 2017-08-04 NOTE — Consult Note (Addendum)
Reason for Consult:guaiac positive anemia Referring Physician: Hospital team  Mitchell Garcia is an 72 y.o. male.  HPI: patient seen and examined and hospital computer chart reviewed and at home the patient has not seen any blood in his bowels but here he says because of his diet he does not tolerate it well and does see some bright red blood and he has never had a colonoscopy and his family history is negative from a GI standpoint and he has vomited his food some of the hospital without any blood and he does not see any black stools and is on aspirin and a blood thinner at home any may have had an endoscopy years ago in [place] but is not aware of any findings but does remember that that cured his constipation and he has no other complaints he was also found to be guaiac-positive last hospital stay but no workup was done but he was transfused while in the hospital  Past Medical History:  Diagnosis Date  . Acute kidney injury superimposed on chronic kidney disease ([place]) 10/15/2016  . Altered mental state 01/25/2013  . Anemia of chronic disease 01/25/2013  . CAD S/P percutaneous coronary angioplasty    a.not to be a candidate for CABG. On 9/19, went back to cath lab for PCI s/p orbital atherectomy with DES to ostial RCA, and PTCA/DES to distal RCA. Residual 50% prox LAD, 70% D1, CTO mLCx noted by initial diagnostic cath 10/08/16 to be treated medically.  . Cancer ([place])   . Cellulitis 07/2017   right leg  . Cellulitis of right lower extremity 07/25/2017  . Chronic combined systolic and diastolic CHF (congestive heart failure) ([place])   . CKD (chronic kidney disease), stage IV ([place])   . Diabetes mellitus with renal complications (H[facility])    uncontrolled  . Essential hypertension 10/15/2016  . Humerus head fracture 01/27/2013  . Hyperlipidemia   . Hyponatremia 01/25/2013  . Insulin dependent diabetes mellitus ([place])   . Ischemic cardiomyopathy 10/15/2016  . MI (myocardial infarction) ([place])    years ago  .  Stroke ([place])   . Transaminitis 01/25/2013  . Uncontrolled diabetes mellitus ([place]) 10/15/2016    Past Surgical History:  Procedure Laterality Date  . CORONARY STENT INTERVENTION N/A 10/13/2016   Procedure: CORONARY STENT INTERVENTION;  Surgeon: Burnell Blanks, MD;  Location: [facility];  Service: Cardiovascular;  Laterality: N/A;  . IR FLUORO GUIDE CV LINE RIGHT  05/19/2017  . IR US GUIDE VASC ACCESS RIGHT  05/19/2017  . LEFT HEART CATH AND CORONARY ANGIOGRAPHY N/A 10/08/2016   Procedure: LEFT HEART CATH AND CORONARY ANGIOGRAPHY;  Surgeon: Nelva Bush, MD;  Location: [facility];  Service: Cardiovascular;  Laterality: N/A;  . SKIN CANCER EXCISION    . TEMPORARY PACEMAKER N/A 10/13/2016   Procedure: Temporary Pacemaker;  Surgeon: Burnell Blanks, MD;  Location: [facility];  Service: Cardiovascular;  Laterality: N/A;    Family History  Problem Relation Age of Onset  . Hyperlipidemia Father     Social History:  reports that he has never smoked. He has never used smokeless tobacco. He reports that he does not drink alcohol or use drugs.  Allergies:  Allergies  Allergen Reactions  . Oxycodone Other (See Comments)    DIFFICULT TO WAKE UP  . Tramadol Nausea And Vomiting  . Lisinopril Other (See Comments)    Hyperkalemia   . Losartan Other (See Comments)    Hyperkalemia  Medications: I have reviewed the patient's current medications.  Results for orders placed or performed during the hospital encounter of 07/25/17 (from the past 48 hour(s))  Glucose, capillary     Status: Abnormal   Collection Time: 08/02/17  5:11 PM  Result Value Ref Range   Glucose-Capillary 192 (H) 70 - 99 mg/dL  Occult blood card to lab, stool     Status: Abnormal   Collection Time: 08/02/17  5:56 PM  Result Value Ref Range   Fecal Occult Bld POSITIVE (A) NEGATIVE    Comment: Performed at [facility], [address], [place], [place] [address]  Glucose,  capillary     Status: Abnormal   Collection Time: 08/02/17 11:23 PM  Result Value Ref Range   Glucose-Capillary 160 (H) 70 - 99 mg/dL  Basic metabolic panel     Status: Abnormal   Collection Time: 08/03/17  3:35 AM  Result Value Ref Range   Sodium 130 (L) 135 - 145 mmol/L   Potassium 3.6 3.5 - 5.1 mmol/L    Comment: NO VISIBLE HEMOLYSIS   Chloride 92 (L) 98 - 111 mmol/L    Comment: Please note change in reference range.   CO2 25 22 - 32 mmol/L   Glucose, Bld 143 (H) 70 - 99 mg/dL    Comment: Please note change in reference range.   BUN 24 (H) 8 - 23 mg/dL    Comment: Please note change in reference range.   Creatinine, Ser 3.95 (H) 0.61 - 1.24 mg/dL    Comment: DELTA CHECK NOTED   Calcium 7.4 (L) 8.9 - 10.3 mg/dL   GFR calc non Af Amer 14 (L) >60 mL/min   GFR calc Af Amer 16 (L) >60 mL/min    Comment: (NOTE) The eGFR has been calculated using the CKD EPI equation. This calculation has not been validated in all clinical situations. eGFR's persistently <60 mL/min signify possible Chronic Kidney Disease.    Anion gap 13 5 - 15    Comment: Performed at [facility] [address] [place], [place] [address]  CBC     Status: Abnormal   Collection Time: 08/03/17  3:35 AM  Result Value Ref Range   WBC 15.1 (H) 4.0 - 10.5 K/uL   RBC 2.97 (L) 4.22 - 5.81 MIL/uL   Hemoglobin 8.4 (L) 13.0 - 17.0 g/dL   HCT 28.0 (L) 39.0 - 52.0 %   MCV 94.3 78.0 - 100.0 fL   MCH 28.3 26.0 - 34.0 pg   MCHC 30.0 30.0 - 36.0 g/dL   RDW 15.3 11.5 - 15.5 %   Platelets 360 150 - 400 K/uL    Comment: Performed at [facility], [place] [address] [place], [place] [address]  Glucose, capillary     Status: Abnormal   Collection Time: 08/03/17  8:01 AM  Result Value Ref Range   Glucose-Capillary 217 (H) 70 - 99 mg/dL   Comment 1 Notify RN    Comment 2 Document in Chart   Glucose, capillary     Status: Abnormal   Collection Time: 08/03/17 12:02 PM  Result Value Ref Range   Glucose-Capillary 181  (H) 70 - 99 mg/dL  Hemoglobin and hematocrit, blood     Status: Abnormal   Collection Time: 08/03/17  2:58 PM  Result Value Ref Range   Hemoglobin 8.3 (L) 13.0 - 17.0 g/dL   HCT 27.2 (L) 39.0 - 52.0 %    Comment: Performed at [facility], [place] [address], [place], [place] [address]  Glucose, capillary     Status: Abnormal   Collection Time: 08/03/17  5:06 PM  Result Value Ref Range   Glucose-Capillary 154 (H) 70 - 99 mg/dL  Glucose, capillary     Status: Abnormal   Collection Time: 08/03/17  9:16 PM  Result Value Ref Range   Glucose-Capillary 214 (H) 70 - 99 mg/dL  Basic metabolic panel     Status: Abnormal   Collection Time: 08/04/17  4:06 AM  Result Value Ref Range   Sodium 131 (L) 135 - 145 mmol/L   Potassium 3.8 3.5 - 5.1 mmol/L   Chloride 91 (L) 98 - 111 mmol/L    Comment: Please note change in reference range.   CO2 28 22 - 32 mmol/L   Glucose, Bld 208 (H) 70 - 99 mg/dL    Comment: Please note change in reference range.   BUN 32 (H) 8 - 23 mg/dL    Comment: Please note change in reference range.   Creatinine, Ser 5.31 (H) 0.61 - 1.24 mg/dL   Calcium 7.2 (L) 8.9 - 10.3 mg/dL   GFR calc non Af Amer 10 (L) >60 mL/min   GFR calc Af Amer 11 (L) >60 mL/min    Comment: (NOTE) The eGFR has been calculated using the CKD EPI equation. This calculation has not been validated in all clinical situations. eGFR's persistently <60 mL/min signify possible Chronic Kidney Disease.    Anion gap 12 5 - 15    Comment: Performed at [facility] [address], [place], [place] [address]  CBC     Status: Abnormal   Collection Time: 08/04/17  4:06 AM  Result Value Ref Range   WBC 14.2 (H) 4.0 - 10.5 K/uL   RBC 2.27 (L) 4.22 - 5.81 MIL/uL   Hemoglobin 6.6 (LL) 13.0 - 17.0 g/dL    Comment: REPEATED TO VERIFY CRITICAL RESULT CALLED TO, READ BACK BY AND VERIFIED WITH: Melina Fiddler RN [phone] R    HCT 21.4 (L) 39.0 - 52.0 %   MCV 94.3 78.0 - 100.0 fL   MCH 29.1  26.0 - 34.0 pg   MCHC 30.8 30.0 - 36.0 g/dL   RDW 15.4 11.5 - 15.5 %   Platelets 370 150 - 400 K/uL    Comment: Performed at [facility], [place] [address] [place], [place] [address]  Type and screen [place]     Status: None (Preliminary result)   Collection Time: 08/04/17  6:25 AM  Result Value Ref Range   ABO/RH(D) A POS    Antibody Screen NEG    Sample Expiration 08/07/2017    Unit Number [id number]    Blood Component Type RED CELLS,LR    Unit division 00    Status of Unit ISSUED    Transfusion Status OK TO TRANSFUSE    Crossmatch Result Compatible    Unit Number [id number]    Blood Component Type RED CELLS,LR    Unit division 00    Status of Unit ISSUED    Transfusion Status OK TO TRANSFUSE    Crossmatch Result      Compatible Performed at [facility], [place] [address] [place], [place] [address]   Prepare RBC     Status: None   Collection Time: 08/04/17  6:25 AM  Result Value Ref Range   Order Confirmation      ORDER PROCESSED BY BLOOD BANK  Performed at [facility], [place] [address] [place], [place] [address]   Glucose, capillary     Status: Abnormal   Collection Time: 08/04/17 12:45 PM  Result Value Ref Range   Glucose-Capillary 152 (H) 70 - 99 mg/dL    No results found.  ROSnegative except above Blood pressure 137/61, pulse 86, temperature 98.1 F (36.7 C), temperature source Oral, resp. rate 20, height 5' 11" (1.803 m), weight 95.8 kg (211 lb 3.2 oz), SpO2 97 %. Physical Examvital signs stable afebrile no acute distress heart and lungs okay abdomen is soft nontenderhemoglobin drop to 6.6 was transfused today in dialysis and transfused last hospital admission but none in between  Assessment/Plan: Episodic guaiac positivity currently with some bright red blood per rectum and chronic anemia Plan: If okay with medical team and kidney doctors we'll plan to proceed with colonoscopy on Monday and consider EGD as well if  colonoscopy negative or we can try to set up as an outpatient and happy to proceed sooner if signs of active bleeding  Reuven Braver E 08/04/2017, 1:49 PM

## 2017-08-04 NOTE — Progress Notes (Addendum)
CRITICAL VALUE ALERT  Critical Value:  Hgb 6.6  Date & Time Notied:  08/04/17   Provider Notified: yes  Orders Received/Actions taken: D/C Plavix, Aspirin, prepare for IVF w/ 2 units RBC

## 2017-08-04 NOTE — Social Work (Addendum)
Attempted to call pt granddaughter Mitchell Garcia as requested, no answer, voicemail full.  Will attempt again, pt clinicals have been faxed to [facility] committee for review, continue search for SNF placement.   1:05pm- Attempted to call granddaughter Mitchell Garcia, no answer and unable to leave voicemail.   1:50pm- CSW spoke with Mitchell Garcia, CSW at [facility]. Explained that this Probation officer had sent in referral for SNF through [place] contracted facility. Explained also that we have also been looking for placement through pt's [facility]. This has thus been unsuccessful given that pt requires transportation to [place] in [place] for dialysis. CSW continuing to search for SNF placement, await determination from [facility] screening committee. CSW also left message on pt's wifes voicemail with CSW number for pt granddaughter to f/u with CSW.   2:28pm- CSW received call from pt granddaughter Mitchell Garcia, she is involved in pt care and just wanted updates regarding placement. CSW explained process, current challenges in regards to dialysis and that we are waiting to hear from the [facility] in regards to pt approval at a [place] facility. Pt granddaughter specifically requested CSW f/u with [facility].   CSW has sent referral to [facility] and called admissions liaison to look at referral.   Mitchell Garcia, [place] Work [phone]

## 2017-08-04 NOTE — Progress Notes (Signed)
Paged provider, NT noted pt had large stool streaked with blood.  Blood on wiping as well. No changes to be made at this time per doctor.

## 2017-08-04 NOTE — Progress Notes (Signed)
Family Medicine Teaching Service Daily Progress Note Intern Pager: [phone]  Patient name: Mitchell Garcia Medical record number: [id number] Date of birth: 08-29-1945 Age: 72 y.o. Gender: male  Primary Care Provider: [name], MD Consultants: nephrology, orthopedics, wound care  Code Status: Full   Pt Overview and Major Events to Date:  7/1 - admitted to [facility] for cellulitis and chronic leg wound 7/6 biopsy by general surgery 7/8 ID stop all antibiotics 7/9 biopsy results suggestive of ulcerative angiodermatitis 7/9 patient had bowel movement with bright red blood 7/11 patient transfused with 2 units packed red blood cells  Assessment and Plan: Mitchell Trudel Parrishis a 72 y.o.malepresenting with cellulitis of leg. PMH is significant for ESRD on HD, T2DM, anemia, combined systolic/diastolic HF, CAD with history of NSTEMI, chronic leg wound  Cellulitis of right leg with non-healing ulcer: Cellulitis resolved, ulcer stable. Biopsy suggestive of ulcerative angiodermatitis.  Currently being treated with topical steroid cream per dermatology recommendation.  Not currently on antibiotics per ID recommendation.  Today appears stable.  No new wounds noted from yesterday. -Follow-up with Derm at [place] topical steroid cream to intact skin of right lower extremity -Continue wound care   Rectal Bleeding: Stable  Hemoglobin this morning 7.5, down from 9.4 yesterday posttransfusion.  Status post 2 units packed red blood cells on 7/11.  GI recommending colonoscopy on Monday and follow-up EGD if colonoscopy is negative. -Continue to hold Plavix, ASA, lovenox - SCD on left leg -We will repeat CBC this afternoon and consider transfusion if hemoglobin continues to decrease - Follow-up CBC in a.m.   Knee pain: Improving Pain and range of motion in right knee improving.   - continue oxycodone 5mg  q8hrs prn for pain -ice packs prn for knee pain  ESRD on HD: Stable  TTS  schedule. - Nephrology following for HD - Avoid nephrotoxic agents. -Continue to follow daily BMP -Continue to monitor strict I/O's - continue home renal meds  Poorly controlled T2DM: Improving Chronic. Last A1c 7.3 on 7/8.  This morning blood glucose 136. -Continue mSSIwith evening coverage Continue to monitor CBG AC/at bedtime   Anemia of chronic disease: Worsening Baseline about 8.  On darbepoetin 60 MCG started on 7/4.  See Rectal Bleeding problem. - continue to monitor   Combined HF: Chronic Most recent Echo 07/09/17 showing LVEF 40-45%. -Continue to monitor strict I/O's - continue home HF medications  CAD with history of NSTEMI: Chronic S/P Stent x2. Left Heart Cath 9/14 significant for multivessel disease with proximal LAD stenosis of 70%, total occlusion of mid LCx, and 95% of RCA stenosis.  -Currently holding aspirin and Plavix due to rectal bleeding -Continue statin, nitro, Coreg  Hyponatremia 135 on 7/5, has trended down to 129 on 7/8.  This AM 131. Patient ESRD on HD. - continue to monitor with daily BMP  Leukocytosis: Persistent Likely demargination, neutrophils 15.4 on differential 7/1.  Doubt infection as patient has been adequately treated by antibiotics for cellulitis, doubt malignancy as no constitutional symptoms, patient is not currently on oral steroids.  He has a chronic leg wound on his right lower extremity. -Continue to monitor with daily CBC  FEN/GI:renal/carb modified diet Prophylaxis:SCD on left leg  Disposition: SNF pending placement at [facility]  Subjective:  Patient states that he is feeling well this morning.  States that knee pain has improved.  Denies any more bright red blood in bowel movements.  Denies dizziness, chest pain, abdominal pain, rectal pain.  Denies shortness of breath  Objective: Temp:  [  97.9 F (36.6 C)-98.5 F (36.9 C)] 97.9 F (36.6 C) (07/12 0456) Pulse Rate:  [67-86] 71 (07/12 0456) Resp:  [15-20] 17 (07/12  0456) BP: (85-141)/(48-89) 123/59 (07/12 0456) SpO2:  [95 %-98 %] 95 % (07/12 0456) Weight:  [211 lb 3.2 oz (95.8 kg)-215 lb 9.8 oz (97.8 kg)] 215 lb 6.2 oz (97.7 kg) (07/12 0500)   Physical exam: General: 72 year old male awake and alert laying in bed in no acute distress Cardiovascular: Regular rate and rhythm 3/6 systolic murmur Lungs: Clear to auscultation bilaterally no wheezes rales or rhonchi Abdomen: Soft, nontender to palpation with positive bowel sounds Extremities: Mild tenderness to palpation of right knee, held in slightly flexed position, Ace wrap over wound on lower right extremity, no drainage noted on bandage, one wound on lateral and inferior to right knee, not draining, venous stasis changes-stable   Laboratory: Recent Labs  Lab 08/03/17 0335  08/04/17 0406 08/04/17 1419 08/05/17 0523  WBC 15.1*  --  14.2*  --  15.7*  HGB 8.4*   < > 6.6* 9.4* 7.5*  HCT 28.0*   < > 21.4* 29.9* 24.1*  PLT 360  --  370  --  275   < > = values in this interval not displayed.   Recent Labs  Lab 08/03/17 0335 08/04/17 0406 08/05/17 0523  NA 130* 131* 134*  K 3.6 3.8 3.9  CL 92* 91* 99  CO2 25 28 28   BUN 24* 32* 19  CREATININE 3.95* 5.31* 3.90*  CALCIUM 7.4* 7.2* 7.3*  GLUCOSE 143* 208* 136*     Imaging/Diagnostic Tests: Dg Chest 2 View  Result Date: 07/07/2017 CLINICAL DATA:  Syncope with fall this morning. Shortness of breath and dizziness. EXAM: CHEST - 2 VIEW COMPARISON:  05/26/2017 FINDINGS: Right IJ central venous catheter unchanged with tip over the SVC. Lungs are adequately inflated without focal consolidation or effusion. Mild stable cardiomegaly. Remainder of the exam is unchanged. IMPRESSION: No acute findings. Stable cardiomegaly.  Right IJ central venous catheter unchanged. Electronically Signed   By: Marin Olp M.D.   On: 07/07/2017 19:45   Dg Knee 1-2 Views Right  Result Date: 07/25/2017 CLINICAL DATA:  Wound cellulitis. EXAM: RIGHT KNEE - 1-2 VIEW  COMPARISON:  07/25/2017 FINDINGS: No joint effusion identified. Mild diffuse soft tissue swelling noted. Vascular calcifications are noted no fracture or dislocation identified. No focal bone erosions. IMPRESSION: No focal bone abnormality. Soft tissue swelling. Electronically Signed   By: Kerby Moors M.D.   On: 07/25/2017 17:26   Dg Tibia/fibula Right  Result Date: 07/25/2017 CLINICAL DATA:  Soft tissue wounds about the right knee and lower leg. EXAM: RIGHT TIBIA AND FIBULA - 2 VIEW COMPARISON:  None. FINDINGS: No acute bony or joint abnormality is identified. No soft tissue gas or radiopaque foreign body is seen. Atherosclerosis is noted. IMPRESSION: No acute abnormality. Atherosclerosis. Electronically Signed   By: Inge Rise M.D.   On: 07/25/2017 17:28   Ct Head Wo Contrast  Result Date: 07/07/2017 CLINICAL DATA:  Fall today, striking head/face on asphalt. Left facial pain. EXAM: CT HEAD WITHOUT CONTRAST CT CERVICAL SPINE WITHOUT CONTRAST TECHNIQUE: Multidetector CT imaging of the head and cervical spine was performed following the standard protocol without intravenous contrast. Multiplanar CT image reconstructions of the cervical spine were also generated. COMPARISON:  CT head from 01/25/2013 FINDINGS: CT HEAD FINDINGS Brain: The brainstem, cerebellum, cerebral peduncles, thalami, basal ganglia, basilar cisterns, and ventricular system appear within normal limits. No intracranial hemorrhage, mass lesion,  or acute CVA. Vascular: There is atherosclerotic calcification of the cavernous carotid arteries bilaterally. Skull: Unremarkable Sinuses/Orbits: Chronic ethmoid sinusitis. Opacification of the visualized right maxillary sinus. Other: No supplemental non-categorized findings. CT CERVICAL SPINE FINDINGS Alignment: No vertebral subluxation is observed. Skull base and vertebrae: Degenerative endplate sclerosis at Y8-5 with loss of disc height at C3-4, C5-6, C6-7, and C7-T1. Notable spurring in the  cervical and thoracic spine. Soft tissues and spinal canal: Enlarged right thyroid lobe with a suspected 2.4 cm right thyroid nodule on image 80/5. Bilateral common carotid atherosclerotic calcification. Disc levels: There is suspected osseous foraminal narrowing on the left at C3-4, C4-5, C5-6, and C6-7; and on the right at C5-6. I cannot exclude a central disc protrusion at C5-6. Upper chest: Atherosclerotic calcification of the aortic arch and branch vessels. Right upper paratracheal lymph node borderline enlarged at 1.0 cm on image 89/5. Other: No supplemental non-categorized findings. IMPRESSION: 1. No acute intracranial findings or acute cervical spine findings. 2. 2.4 cm right thyroid lobe nodule. Consider further evaluation with thyroid ultrasound. If patient is clinically hyperthyroid, consider nuclear medicine thyroid uptake and scan. 3. Cervical spondylosis and degenerative disc disease with potential impingement at C3-4, C4-5, C5-6, and C6-7. 4. Atherosclerosis. 5. Upper normal sized right upper paratracheal lymph node, nonspecific. 6. Chronic ethmoid sinusitis. There is also opacification of visualized right maxillary sinus. Electronically Signed   By: Van Clines M.D.   On: 07/07/2017 13:13   Ct Cervical Spine Wo Contrast  Result Date: 07/07/2017 CLINICAL DATA:  Fall today, striking head/face on asphalt. Left facial pain. EXAM: CT HEAD WITHOUT CONTRAST CT CERVICAL SPINE WITHOUT CONTRAST TECHNIQUE: Multidetector CT imaging of the head and cervical spine was performed following the standard protocol without intravenous contrast. Multiplanar CT image reconstructions of the cervical spine were also generated. COMPARISON:  CT head from 01/25/2013 FINDINGS: CT HEAD FINDINGS Brain: The brainstem, cerebellum, cerebral peduncles, thalami, basal ganglia, basilar cisterns, and ventricular system appear within normal limits. No intracranial hemorrhage, mass lesion, or acute CVA. Vascular: There is  atherosclerotic calcification of the cavernous carotid arteries bilaterally. Skull: Unremarkable Sinuses/Orbits: Chronic ethmoid sinusitis. Opacification of the visualized right maxillary sinus. Other: No supplemental non-categorized findings. CT CERVICAL SPINE FINDINGS Alignment: No vertebral subluxation is observed. Skull base and vertebrae: Degenerative endplate sclerosis at O2-7 with loss of disc height at C3-4, C5-6, C6-7, and C7-T1. Notable spurring in the cervical and thoracic spine. Soft tissues and spinal canal: Enlarged right thyroid lobe with a suspected 2.4 cm right thyroid nodule on image 80/5. Bilateral common carotid atherosclerotic calcification. Disc levels: There is suspected osseous foraminal narrowing on the left at C3-4, C4-5, C5-6, and C6-7; and on the right at C5-6. I cannot exclude a central disc protrusion at C5-6. Upper chest: Atherosclerotic calcification of the aortic arch and branch vessels. Right upper paratracheal lymph node borderline enlarged at 1.0 cm on image 89/5. Other: No supplemental non-categorized findings. IMPRESSION: 1. No acute intracranial findings or acute cervical spine findings. 2. 2.4 cm right thyroid lobe nodule. Consider further evaluation with thyroid ultrasound. If patient is clinically hyperthyroid, consider nuclear medicine thyroid uptake and scan. 3. Cervical spondylosis and degenerative disc disease with potential impingement at C3-4, C4-5, C5-6, and C6-7. 4. Atherosclerosis. 5. Upper normal sized right upper paratracheal lymph node, nonspecific. 6. Chronic ethmoid sinusitis. There is also opacification of visualized right maxillary sinus. Electronically Signed   By: Van Clines M.D.   On: 07/07/2017 13:13   Ct Extremity Lower Right Wo Contrast  Result Date: 07/29/2017 CLINICAL DATA:  Lower extremity cellulitis.  Penetrating trauma EXAM: CT OF THE LOWER RIGHT EXTREMITY WITHOUT CONTRAST TECHNIQUE: Multidetector CT imaging of the right lower extremity  was performed. Today's exam from the iliac crest through the toes would typically require at least 3 separate CT examinations as there are multiple regions encompassed, each with their own procedure code. There is no single defined CPT code aside from a CT angiogram runoff that includes the whole pelvis, thigh, leg, and foot. As a courtesy this single time, we are performing and reporting this as one CT scan. In the future, individual regions of the extremity need their own order, such as thigh, tibia/fibula, and foot. Reconstructions in this case are not ideal for assessment of individual structures such as joints, this is a large field survey and not a detailed assessment of any of the included joints. The knee is moderately flexed during imaging, resulting in non ideal axial images particularly through the calf. COMPARISON:  None. FINDINGS: Bones/Joint/Cartilage No osteomyelitis seen, low sensitivity in assessing the toes. No definite bony destructive findings. Mild osteoarthritis of the knee. Trace knee effusion. Ligaments Not assessed by CT. Muscles and Tendons No appreciable drainable abscess is observed on today's noncontrast assessment. Soft tissues Subcutaneous edema most notable along the proximal calf, with a suggestion of some skin disruption posteriorly along the mid calf. I do not see a foreign body. There may be some very minimal subcutaneous gas adjacent to the skin disruption for example on image 258/4. Notable arterial atherosclerotic calcification. Mild subcutaneous edema tracking along the dorsum of the foot. IMPRESSION: 1. No findings of abscess or osteomyelitis. 2. Skin disruption along the posterior calf with some confluent regional subcutaneous edema potentially reflecting cellulitis. 3. Notable atherosclerotic calcification. 4. Dorsal subcutaneous edema in the foot, nonspecific. Electronically Signed   By: Van Clines M.D.   On: 07/29/2017 17:07    Rittberger, Bernita Raisin,  DO 08/05/2017, 7:33 AM PGY-1, [place] Intern pager: [phone], text pages welcome

## 2017-08-04 NOTE — Discharge Instructions (Signed)
Stasis Dermatitis Stasis dermatitis is a long-term (chronic) skin condition that happens when veins can no longer pump blood back to the heart (poor circulation). This condition causes a red or brown scaly rash or sores (ulcers) from the pooling of blood (stasis). This condition usually affects the lower legs. It may affect one leg or both legs. Without treatment, severe stasis dermatitis can lead to other skin conditions and infections. What are the causes? This condition is caused by poor circulation. What increases the risk? This condition is more likely to develop in people who:  Are not very active.  Stand for long periods of time.  Have veins that have become enlarged and twisted (varicose veins).  Have leg veins that are not strong enough to send blood back to the heart (venous insufficiency).  Have had a blood clot.  Have been pregnant many times.  Have had vein surgery.  Are obese.  Have heart or kidney failure.  Are 50 years of age or older.  What are the signs or symptoms? Common early symptoms of this condition include:  Swelling in your ankle or leg. This might get better overnight but be worse again in the day.  Skin that looks thin on your ankle and leg.  Brown marks that develop slowly.  Skin that is easily irritated or cracked.  Red, swollen skin.  An achy or heavy feeling after you walk or stand for long periods of time.  Pain.  Later and more severe symptoms of this condition include:  Skin that looks shiny.  Small, open sores (ulcers). These are often red or purple.  Dry, cracking skin.  Skin that feels hard.  Severe itching.  A change in the shape or color of your lower legs.  Severe pain.  Difficulty walking.  How is this diagnosed? Your health care provider may suspect this condition from your symptoms and medical history. Your health care provider will also do a physical exam. You may need to see a health care provider who  specializes in skin diseases (dermatologist). You may also have tests to confirm the diagnosis, including:  Blood tests.  Imaging studies to check blood flow (Doppler ultrasound).  Allergy tests.  How is this treated? Treatment for this condition may include medicine, such as:  Corticosteroid creams and ointments.  Non-corticosteroid medicines applied to the skin (topical).  Medicine to reduce swelling in the legs (diuretics).  Antibiotics.  Medicine to relieve itching (antihistamines).  You may also have to wear:  Compression stockings or an elastic wrap to improve circulation.  A bandage (dressing).  A wrap that contains zinc and gelatin (Unna boot).  Follow these instructions at home: Skin Care  Moisturize your skin as told by your health care provider. Do not use moisturizers with fragrance. This can irritate your skin.  Apply cool compresses to the affected areas.  Do not scratch your skin.  Do not rub your skin dry after a bath or shower. Gently pat your skin dry.  Do not use scented soaps, detergents, or perfumes. Medicines  Take or use over-the-counter and prescription medicines only as told by your health care provider.  If you were prescribed an antibiotic medicine, take or use it as told by your health care provider. Do not stop taking or using the antibiotic even if your condition starts to improve. Lifestyle  Do not stand or sit in one position for long periods of time.  Do not cross your legs when you sit.  Raise (elevate) your   legs above the level of your heart when you are sitting or lying down.  Walk as told by your health care provider. Walking increases blood flow.  Wear comfortable, loose-fitting clothing. Circulation in your legs will be worse if you wear tight pants, belts, and waistbands. General instructions  Change and remove any dressing as told by your health care provider, if this applies.  Wear compression stockings as told by  your health care provider, if this applies. These stockings help to prevent blood clots and reduce swelling in your legs.  Wear the Unna boot as told by your health care provider, if this applies.  Keep all follow-up visits as told by your health care provider. This is important. Contact a health care provider if:  Your condition does not improve with treatment.  Your condition gets worse.  You have signs of infection in the affected area. Watch for: ? Swelling. ? Tenderness. ? Redness. ? Soreness. ? Warmth.  You have a fever. Get help right away if:  You notice red streaks coming from the affected area.  Your bone or joint underneath the affected area becomes painful after the skin has healed.  The affected area turns darker.  You feel a deep pain in your leg or groin.  You are short of breath. This information is not intended to replace advice given to you by your health care provider. Make sure you discuss any questions you have with your health care provider. Document Released: 04/22/2005 Document Revised: 09/09/2015 Document Reviewed: 05/29/2014 Elsevier Interactive Patient Education  2018 [employer]  

## 2017-08-04 NOTE — Progress Notes (Signed)
[facility] Progress Note    Assessment/ Plan:   1 ESRD   Seen on HD at 940 AM Tolerating treatment w/o complaints or cramping VSS 3K/2.25 -> change to 2.5Ca No further changes.  - next HD Sat to resume TTS regimen. 2 Leg ulcer bx - fortunately not calciphylaxis. 3 Anemia stable - check iron panel 4 HPTH - on Renvela 3 tabs TIDM 5 CAD 6 PVD    Subjective:   C/o rt leg pain and difficulty walking bec of rt knee pain (locked up). Denies f/c/n/v/dyspnea.   Objective:   BP 128/75   Pulse 76   Temp 97.9 F (36.6 C)   Resp 15   Ht 5\' 11"  (1.803 m)   Wt 97.8 kg (215 lb 9.8 oz)   SpO2 98%   BMI 30.07 kg/m   Intake/Output Summary (Last 24 hours) at 08/04/2017 0957 Last data filed at 08/04/2017 0930 Gross per 24 hour  Intake 1095 ml  Output 275 ml  Net 820 ml   Weight change:   Physical Exam: General appearance:alert, cooperative, no distress, mildly obese and pale Resp:diminished breath soundsbilaterally Chest wall:RIJ PC Cardio:S1, S2 normal and systolic murmur:systolic FYBOFBPZ0/2,HENIDPOEUMPNT 2nd left intercostal space IR:WERX, non-tender; bowel sounds normal; no masses, no organomegaly Extremities:BAndage R calf, erythema adj going into thigh; rt knee mobile but pain on passive flexion Access: rt cimino with good bruit (inflow lesion)    Imaging: No results found.  Labs: BMET Recent Labs  Lab 07/28/17 2000 07/29/17 0532 07/30/17 0950 07/31/17 0555 08/01/17 0612 08/02/17 5400 08/02/17 0740 08/03/17 0335 08/04/17 0406  NA 125* 135  135 130* 132* 129* 124* 126* 130* 131*  K 4.4 4.5  4.5 4.2 3.8 4.2 4.3 4.4 3.6 3.8  CL 86* 95*  94* 89* 93* 91* 88* 88* 92* 91*  CO2 23 25  24 25 27 25  18* 24 25 28   GLUCOSE 172* 161*  161* 147* 128* 141* 115* 113* 143* 208*  BUN 66* 29*  28* 48* 26* 42* 51* 51* 24* 32*  CREATININE 6.14* 3.84*  3.87* 5.40* 3.80* 5.21* 6.14* 6.11* 3.95* 5.31*  CALCIUM 7.9* 7.8*  7.8* 7.6* 7.7* 7.9*  7.8* 7.9* 7.4* 7.2*  PHOS 6.8* 3.9 5.0*  --   --   --  5.9*  --   --    CBC Recent Labs  Lab 08/01/17 0612 08/02/17 0638 08/03/17 0335 08/03/17 1458 08/04/17 0406  WBC 14.8* 15.5* 15.1*  --  14.2*  HGB 8.7* 8.5* 8.4* 8.3* 6.6*  HCT 28.5* 26.6* 28.0* 27.2* 21.4*  MCV 95.3 93.3 94.3  --  94.3  PLT 340 332 360  --  370    Medications:    . sodium chloride   Intravenous Once  . acetaminophen  650 mg Oral Q6H  . atorvastatin  80 mg Oral QHS  . Chlorhexidine Gluconate Cloth  6 each Topical Q0600  . cinacalcet  30 mg Oral Q T,Th,Sa-HD  . darbepoetin (ARANESP) injection - DIALYSIS  60 mcg Intravenous Q Tue-HD  . gabapentin  300 mg Oral QHS  . insulin aspart  0-15 Units Subcutaneous TID WC  . insulin aspart  0-5 Units Subcutaneous QHS  . isosorbide-hydrALAZINE  1 tablet Oral BID  . multivitamin  1 tablet Oral QHS  . primidone  100 mg Oral Daily  . sevelamer carbonate  2,400 mg Oral TID WC  . triamcinolone cream   Topical BID  . vitamin B-12  1,000 mcg Oral Daily  Otelia Santee, MD 08/04/2017, 9:57 AM

## 2017-08-05 ENCOUNTER — Inpatient Hospital Stay (HOSPITAL_COMMUNITY): Payer: Non-veteran care

## 2017-08-05 LAB — BASIC METABOLIC PANEL
ANION GAP: 7 (ref 5–15)
BUN: 19 mg/dL (ref 8–23)
CALCIUM: 7.3 mg/dL — AB (ref 8.9–10.3)
CO2: 28 mmol/L (ref 22–32)
CREATININE: 3.9 mg/dL — AB (ref 0.61–1.24)
Chloride: 99 mmol/L (ref 98–111)
GFR, EST AFRICAN AMERICAN: 16 mL/min — AB (ref >60)
GFR, EST NON AFRICAN AMERICAN: 14 mL/min — AB (ref >60)
GLUCOSE: 136 mg/dL — AB (ref 70–99)
Potassium: 3.9 mmol/L (ref 3.5–5.1)
Sodium: 134 mmol/L — ABNORMAL LOW (ref 135–145)

## 2017-08-05 LAB — GLUCOSE, CAPILLARY
GLUCOSE-CAPILLARY: 119 mg/dL — AB (ref 70–99)
GLUCOSE-CAPILLARY: 221 mg/dL — AB (ref 70–99)
GLUCOSE-CAPILLARY: 171 mg/dL — AB (ref 70–99)
GLUCOSE-CAPILLARY: 156 mg/dL — AB (ref 70–99)

## 2017-08-05 LAB — BPAM RBC
BLOOD PRODUCT EXPIRATION DATE: 201907312359
Blood Product Expiration Date: 201907312359
ISSUE DATE / TIME: 201907110853
ISSUE DATE / TIME: 201907110853
UNIT TYPE AND RH: 6200
Unit Type and Rh: 6200

## 2017-08-05 LAB — CBC
HCT: 24.1 % — ABNORMAL LOW (ref 39.0–52.0)
HCT: 26.7 % — ABNORMAL LOW (ref 39.0–52.0)
HEMOGLOBIN: 7.5 g/dL — AB (ref 13.0–17.0)
Hemoglobin: 8.2 g/dL — ABNORMAL LOW (ref 13.0–17.0)
MCH: 29.4 pg (ref 26.0–34.0)
MCH: 29.3 pg (ref 26.0–34.0)
MCHC: 31.1 g/dL (ref 30.0–36.0)
MCHC: 30.7 g/dL (ref 30.0–36.0)
MCV: 94.5 fL (ref 78.0–100.0)
MCV: 95.4 fL (ref 78.0–100.0)
PLATELETS: 275 10*3/uL (ref 150–400)
PLATELETS: 358 10*3/uL (ref 150–400)
RBC: 2.55 MIL/uL — ABNORMAL LOW (ref 4.22–5.81)
RBC: 2.8 MIL/uL — ABNORMAL LOW (ref 4.22–5.81)
RDW: 15.5 % (ref 11.5–15.5)
RDW: 15.5 % (ref 11.5–15.5)
WBC: 15.7 10*3/uL — ABNORMAL HIGH (ref 4.0–10.5)
WBC: 17.4 10*3/uL — AB (ref 4.0–10.5)

## 2017-08-05 LAB — TYPE AND SCREEN
ABO/RH(D): A POS
Antibody Screen: NEGATIVE
UNIT DIVISION: 0
Unit division: 0

## 2017-08-05 NOTE — Progress Notes (Signed)
Occupational Therapy Treatment Patient Details Name: Mitchell Garcia MRN: [id number] DOB: [birth year] Today's Date: 08/05/2017    History of present illness Pt is a 72 y/o male admitted secondary to R LE cellulitis and R questionable flexion contracture. After further work up his ailment is found to be an inflamed and ulcerated area of an angiodermatitis.   PMH including but not limited to ESRD, DM, CHF, CAD s/p NSTEMI, CVA.   OT comments  Pt continues to be limited by R knee pain and inability to weight bear, but able to stand and pivot with 2 person moderate assistance with RW to chair. Used medication, repositioning and heat on R knee following gentle stretching. Continues to be appropriate for SNF level rehab.  Follow Up Recommendations  SNF;Supervision/Assistance - 24 hour    Equipment Recommendations       Recommendations for Other Services      Precautions / Restrictions Precautions Precautions: Fall Precaution Comments: R LE pain, limited knee ROM Restrictions Weight Bearing Restrictions: No       Mobility Bed Mobility Overal bed mobility: Needs Assistance Bed Mobility: Supine to Sit     Supine to sit: Supervision;HOB elevated     General bed mobility comments: increased time, no physical assist  Transfers Overall transfer level: Needs assistance Equipment used: Rolling walker (2 wheeled) Transfers: Sit to/from [facility] Sit to Stand: +2 physical assistance;Mod assist Stand pivot transfers: +2 physical assistance;Mod assist       General transfer comment: pt unable to tolerate weight on R LE, maintains knee flexed with plantar flexion of ankle, pivots on L foot, but unable to hop or take a step    Balance     Sitting balance-Leahy Scale: Fair       Standing balance-Leahy Scale: Poor Standing balance comment: B UE and external support required with RW                           ADL either performed or assessed with clinical  judgement   ADL Overall ADL's : Needs assistance/impaired                 Upper Body Dressing : Minimal assistance;Sitting Upper Body Dressing Details (indicate cue type and reason): front opening gown     Toilet Transfer: +2 for physical assistance;Moderate assistance;Stand-pivot;RW           Functional mobility during ADLs: (unable)       Vision       Perception     Praxis      Cognition Arousal/Alertness: Awake/alert Behavior During Therapy: WFL for tasks assessed/performed Overall Cognitive Status: Impaired/Different from baseline Area of Impairment: Memory                     Memory: Decreased short-term memory Following Commands: Follows one step commands consistently;Follows one step commands with increased time Safety/Judgement: Decreased awareness of deficits;Decreased awareness of safety   Problem Solving: Difficulty sequencing;Decreased initiation;Requires verbal cues;Slow processing General Comments: poor insight        Exercises Other Exercises Other Exercises: Attempted heel cord stretch but patient too painful.   Other Exercises: Attempted to position into extension and pt unable to tolerate stretch of gravity.     Shoulder Instructions       General Comments      Pertinent Vitals/ Pain       Pain Assessment: Faces Pain Score: 8  Faces Pain Scale: Hurts  whole lot Pain Location: R LE with ROM and WB Pain Descriptors / Indicators: Grimacing;Guarding Pain Intervention(s): Monitored during session;Repositioned;Heat applied;RN gave pain meds during session  Home Living                                          Prior Functioning/Environment              Frequency  Min 2X/week        Progress Toward Goals  OT Goals(current goals can now be found in the care plan section)  Progress towards OT goals: Progressing toward goals  Acute Rehab OT Goals Patient Stated Goal: decrease pain OT Goal  Formulation: With patient Time For Goal Achievement: 08/10/17 Potential to Achieve Goals: Good  Plan Discharge plan remains appropriate    Co-evaluation    PT/OT/SLP Co-Evaluation/Treatment: Yes Reason for Co-Treatment: For patient/therapist safety PT goals addressed during session: Mobility/safety with mobility;Balance        AM-PAC PT "6 Clicks" Daily Activity     Outcome Measure   Help from another person eating meals?: None Help from another person taking care of personal grooming?: A Little Help from another person toileting, which includes using toliet, bedpan, or urinal?: Total Help from another person bathing (including washing, rinsing, drying)?: A Lot Help from another person to put on and taking off regular upper body clothing?: A Little Help from another person to put on and taking off regular lower body clothing?: Total 6 Click Score: 14    End of Session Equipment Utilized During Treatment: Gait belt;Rolling walker  OT Visit Diagnosis: Unsteadiness on feet (R26.81);Pain;Muscle weakness (generalized) (M62.81);Other symptoms and signs involving cognitive function Pain - Right/Left: Right Pain - part of body: Leg   Activity Tolerance Patient limited by pain   Patient Left in chair;with call bell/phone within reach;with chair alarm set   Nurse Communication Other (comment)(needs PRAFO)        Time: 8099-8338 OT Time Calculation (min): 23 min  Charges: OT General Charges $OT Visit: 1 Visit OT Treatments $Therapeutic Activity: 8-22 mins  08/05/2017 Nestor Lewandowsky, OTR/L Pager: [phone]   Werner Lean Haze Boyden 08/05/2017, 4:27 PM

## 2017-08-05 NOTE — Progress Notes (Addendum)
[facility] Progress Note    Assessment/ Plan:   1 ESRD - Last HD 7/11 - Plan on HD 7/13 (Sat) to cont TTS regimen. 2 Leg ulcer bx- fortunately not calciphylaxis.  - He's not getting OOB or bearing weight bec of the rt knee pain. Pt thinks he will need to go to a facility as his wife will not be able to take care of him bec of the immobility.  3 Anemia stable - check iron panel 4 HPTH - on Renvela 3 tabs TIDM - repeat phos 5 CAD 6 PVD    Subjective:   C/o rt leg pain and difficulty walking bec of rt knee pain (locked up). Denies f/c/n/v/dyspnea but he's getting increasing frustrated bec of the rt knee and inability to bear weight.   Objective:   BP (!) 123/59 (BP Location: Left Arm)   Pulse 71   Temp 97.9 F (36.6 C) (Oral)   Resp 17   Ht 5\' 11"  (1.803 m)   Wt 97.7 kg (215 lb 6.2 oz)   SpO2 95%   BMI 30.04 kg/m   Intake/Output Summary (Last 24 hours) at 08/05/2017 1242 Last data filed at 08/05/2017 0804 Gross per 24 hour  Intake 1035 ml  Output 150 ml  Net 885 ml   Weight change:   Physical Exam: General appearance:alert, cooperative, no distress, mildly obese and pale Resp:diminished breath soundsbilaterally Chest wall:RIJ PC Cardio:S1, S2 normal and systolic murmur:systolic ASTMHDQQ2/2,LNLGXQJJHERDE 2nd left intercostal space YC:XKGY, non-tender; bowel sounds normal; no masses, no organomegaly Extremities:BAndage R calf, erythema adj going into thigh; rt knee mobile but pain on passive flexion Access: rt cimino with good bruit (inflow lesion)    Imaging: No results found.  Labs: BMET Recent Labs  Lab 07/30/17 0950 07/31/17 0555 08/01/17 0612 08/02/17 1856 08/02/17 0740 08/03/17 0335 08/04/17 0406 08/05/17 0523  NA 130* 132* 129* 124* 126* 130* 131* 134*  K 4.2 3.8 4.2 4.3 4.4 3.6 3.8 3.9  CL 89* 93* 91* 88* 88* 92* 91* 99  CO2 25 27 25  18* 24 25 28 28   GLUCOSE 147* 128* 141* 115* 113* 143* 208* 136*  BUN 48*  26* 42* 51* 51* 24* 32* 19  CREATININE 5.40* 3.80* 5.21* 6.14* 6.11* 3.95* 5.31* 3.90*  CALCIUM 7.6* 7.7* 7.9* 7.8* 7.9* 7.4* 7.2* 7.3*  PHOS 5.0*  --   --   --  5.9*  --   --   --    CBC Recent Labs  Lab 08/02/17 0638 08/03/17 0335 08/03/17 1458 08/04/17 0406 08/04/17 1419 08/05/17 0523  WBC 15.5* 15.1*  --  14.2*  --  15.7*  HGB 8.5* 8.4* 8.3* 6.6* 9.4* 7.5*  HCT 26.6* 28.0* 27.2* 21.4* 29.9* 24.1*  MCV 93.3 94.3  --  94.3  --  94.5  PLT 332 360  --  370  --  275    Medications:    . sodium chloride   Intravenous Once  . acetaminophen  650 mg Oral Q6H  . atorvastatin  80 mg Oral QHS  . Chlorhexidine Gluconate Cloth  6 each Topical Q0600  . cinacalcet  30 mg Oral Q T,Th,Sa-HD  . darbepoetin (ARANESP) injection - DIALYSIS  60 mcg Intravenous Q Tue-HD  . gabapentin  300 mg Oral QHS  . insulin aspart  0-15 Units Subcutaneous TID WC  . insulin aspart  0-5 Units Subcutaneous QHS  . isosorbide-hydrALAZINE  1 tablet Oral BID  . multivitamin  1 tablet Oral QHS  . primidone  100 mg Oral Daily  . sevelamer carbonate  2,400 mg Oral TID WC  . triamcinolone cream   Topical BID  . vitamin B-12  1,000 mcg Oral Daily      Otelia Santee, MD 08/05/2017, 12:42 PM

## 2017-08-05 NOTE — Progress Notes (Signed)
Mitchell Garcia 10:08 AM  Subjective: Patient with a little bright red blood today on the toilet tissue only but no other complaints other than not liking the food here and his leg pain  Objective: Vital signs stable afebrile no acute distress abdomen is soft nontender hemoglobin was some fluctuation BUN decreased significantly as well as creatinine post dialysis  Assessment: Guaiac positive anemia  Plan: The risks benefits methods of colonoscopy and if nondiagnostic possibly even an endoscopy was discussed with the patient and have tentatively scheduled him for Monday at 10 AM and we'll check on this weekend and assuming no other changes will begin prep tomorrow and if no significant findings were signs of active bleeding can probably be placed Monday afternoon or Tuesday unless other medical issues that need to be addressed [facility]  Pager [phone] After 5PM or if no answer call [phone]

## 2017-08-05 NOTE — Progress Notes (Addendum)
Physical Therapy Treatment Patient Details Name: Mitchell Garcia MRN: [id number] DOB: 1945-08-24 Today's Date: 08/05/2017    History of Present Illness Pt is a 72 y/o male admitted secondary to R LE cellulitis and R questionable flexion contracture. After further work up his ailment is found to be an inflamed and ulcerated area of an angiodermatitis.   PMH including but not limited to ESRD, DM, CHF, CAD s/p NSTEMI, CVA.    PT Comments    Pt performed bed mobility, transfers and short attempt of gait training.  Pt slow and guarded due to pain and continues to present with R flexion of knee in standing and supine.  Performed co-tx due to patient pain and poor tolerance to activity.  MD paged and k-pad and R PRAFO kick stand boot ordered for in bed use to improve knee extension and reduce pain.  At this time walking is not functional as he lacks knee extension and dorsiflexion to achieve weight shifting to progress gait.  Continue to recommend SNF placement at this time.    Follow Up Recommendations  SNF     Equipment Recommendations  None recommended by PT    Recommendations for Other Services       Precautions / Restrictions Precautions Precautions: Fall Precaution Comments: R LE pain, limited knee ROM Restrictions Weight Bearing Restrictions: No    Mobility  Bed Mobility Overal bed mobility: Needs Assistance Bed Mobility: Supine to Sit     Supine to sit: Supervision;HOB elevated     General bed mobility comments: Pt able to advance B LEs to edge of bed and elevate trunk into sitting.  Pt required increased time but able to perform unassisted  Transfers Overall transfer level: Needs assistance Equipment used: Rolling walker (2 wheeled) Transfers: Sit to/from [facility] Sit to Stand: +2 physical assistance;Mod assist Stand pivot transfers: +2 physical assistance;Mod assist       General transfer comment: Pt able to stand on LLE but refused to put  weight on RLE.  He is able to straighten the leg more but unable to shift weight into standing.  Pt remains to require assistance to boost into standing.  He continues to present with R knee flexion in standing with decreased foot flat.  Ambulation/Gait Ambulation/Gait assistance: Mod assist;+2 physical assistance Gait Distance (Feet): (Hardly able to call gait as neither foot left surface of linoleum,.  Pt is more or less pivoting on LLE.  ) Assistive device: Rolling walker (2 wheeled)           Stairs             Wheelchair Mobility    Modified Rankin (Stroke Patients Only)       Balance     Sitting balance-Leahy Scale: Fair       Standing balance-Leahy Scale: Poor                              Cognition Arousal/Alertness: Awake/alert Behavior During Therapy: WFL for tasks assessed/performed Overall Cognitive Status: Impaired/Different from baseline Area of Impairment: Memory                     Memory: Decreased short-term memory Following Commands: Follows one step commands consistently;Follows one step commands with increased time Safety/Judgement: Decreased awareness of deficits;Decreased awareness of safety   Problem Solving: Difficulty sequencing;Decreased initiation;Requires verbal cues;Slow processing General Comments: poor insight      Exercises Other Exercises  Other Exercises: Attempted heel cord stretch but patient too painful.   Other Exercises: Attempted to position into extension and pt unable to tolerate stretch of gravity.      General Comments        Pertinent Vitals/Pain Pain Assessment: Faces Pain Score: 8  Pain Location: R LE with ROM or WB Pain Descriptors / Indicators: Grimacing;Guarding Pain Intervention(s): Monitored during session;Repositioned    Home Living                      Prior Function            PT Goals (current goals can now be found in the care plan section) Acute Rehab PT  Goals Patient Stated Goal: decrease pain Potential to Achieve Goals: Good Progress towards PT goals: Progressing toward goals    Frequency    Min 2X/week      PT Plan Current plan remains appropriate;Frequency needs to be updated    Co-evaluation PT/OT/SLP Co-Evaluation/Treatment: Yes Reason for Co-Treatment: Complexity of the patient's impairments (multi-system involvement);Necessary to address cognition/behavior during functional activity;For patient/therapist safety(due to pain and innability to tolerate multiple sessions due to pain.  ) PT goals addressed during session: Mobility/safety with mobility;Balance        AM-PAC PT "6 Clicks" Daily Activity  Outcome Measure  Difficulty turning over in bed (including adjusting bedclothes, sheets and blankets)?: A Little Difficulty moving from lying on back to sitting on the side of the bed? : A Little Difficulty sitting down on and standing up from a chair with arms (e.g., wheelchair, bedside commode, etc,.)?: Unable Help needed moving to and from a bed to chair (including a wheelchair)?: A Lot Help needed walking in hospital room?: A Lot Help needed climbing 3-5 steps with a railing? : Total 6 Click Score: 12    End of Session Equipment Utilized During Treatment: Gait belt Activity Tolerance: Patient limited by pain Patient left: in bed;with call bell/phone within reach Nurse Communication: Mobility status PT Visit Diagnosis: Other abnormalities of gait and mobility (R26.89);Pain Pain - Right/Left: Right Pain - part of body: Leg;Knee     Time: 6226-3335 PT Time Calculation (min) (ACUTE ONLY): 26 min  Charges:  $Therapeutic Activity: 8-22 mins                    G Codes:       Governor Rooks, PTA pager [phone]    Cristela Blue 08/05/2017, 2:55 PM

## 2017-08-05 NOTE — Social Work (Signed)
Pt has been approved for [facility] SNF, [facility] is following for possible admit next week. If no bed available at [facility] referrals have been sent to [facility] in [place] and [place] which can both transport pt to dialysis.   CSW will f/u with granddaughter with these details.  Alexander Mt, [place] Work [phone]

## 2017-08-05 NOTE — Progress Notes (Signed)
Orthopedic Tech Progress Note Patient Details:  NIKOLAS CASHER Jan 11, 1946 [id number]  Ortho Devices Type of Ortho Device: Prafo boot/shoe Ortho Device/Splint Location: rle Ortho Device/Splint Interventions: Application   Post Interventions Patient Tolerated: Well Instructions Provided: Care of device   Hildred Priest 08/05/2017, 3:33 PM

## 2017-08-06 LAB — FERRITIN: FERRITIN: 412 ng/mL — AB (ref 24–336)

## 2017-08-06 LAB — IRON AND TIBC
IRON: 29 ug/dL — AB (ref 45–182)
Saturation Ratios: 16 % — ABNORMAL LOW (ref 17.9–39.5)
TIBC: 182 ug/dL — ABNORMAL LOW (ref 250–450)
UIBC: 153 ug/dL

## 2017-08-06 LAB — BASIC METABOLIC PANEL
ANION GAP: 11 (ref 5–15)
BUN: 33 mg/dL — ABNORMAL HIGH (ref 8–23)
CALCIUM: 7.8 mg/dL — AB (ref 8.9–10.3)
CO2: 27 mmol/L (ref 22–32)
CREATININE: 5.38 mg/dL — AB (ref 0.61–1.24)
Chloride: 96 mmol/L — ABNORMAL LOW (ref 98–111)
GFR calc Af Amer: 11 mL/min — ABNORMAL LOW (ref >60)
GFR, EST NON AFRICAN AMERICAN: 9 mL/min — AB (ref >60)
Glucose, Bld: 168 mg/dL — ABNORMAL HIGH (ref 70–99)
Potassium: 4.2 mmol/L (ref 3.5–5.1)
Sodium: 134 mmol/L — ABNORMAL LOW (ref 135–145)

## 2017-08-06 LAB — GLUCOSE, CAPILLARY
GLUCOSE-CAPILLARY: 161 mg/dL — AB (ref 70–99)
Glucose-Capillary: 173 mg/dL — ABNORMAL HIGH (ref 70–99)
Glucose-Capillary: 108 mg/dL — ABNORMAL HIGH (ref 70–99)
Glucose-Capillary: 164 mg/dL — ABNORMAL HIGH (ref 70–99)

## 2017-08-06 LAB — CBC
HEMATOCRIT: 25 % — AB (ref 39.0–52.0)
Hemoglobin: 7.7 g/dL — ABNORMAL LOW (ref 13.0–17.0)
MCH: 29.3 pg (ref 26.0–34.0)
MCHC: 30.8 g/dL (ref 30.0–36.0)
MCV: 95.1 fL (ref 78.0–100.0)
Platelets: 303 10*3/uL (ref 150–400)
RBC: 2.63 MIL/uL — ABNORMAL LOW (ref 4.22–5.81)
RDW: 15.6 % — AB (ref 11.5–15.5)
WBC: 15.2 10*3/uL — AB (ref 4.0–10.5)

## 2017-08-06 LAB — RETICULOCYTES
RBC.: 2.63 MIL/uL — ABNORMAL LOW (ref 4.22–5.81)
RETIC COUNT ABSOLUTE: 63.1 10*3/uL (ref 19.0–186.0)
RETIC CT PCT: 2.4 % (ref 0.4–3.1)

## 2017-08-06 LAB — PHOSPHORUS: Phosphorus: 3.4 mg/dL (ref 2.5–4.6)

## 2017-08-06 LAB — HEPATITIS B SURFACE ANTIGEN: HEP B S AG: NEGATIVE

## 2017-08-06 LAB — LACTATE DEHYDROGENASE: LDH: 178 U/L (ref 98–192)

## 2017-08-06 MED ORDER — HEPARIN SODIUM (PORCINE) 1000 UNIT/ML DIALYSIS: 3000.0000 [IU] | Freq: Once | INTRAMUSCULAR | Status: DC

## 2017-08-06 MED ORDER — TRAMADOL HCL 50 MG PO TABS
100.0000 mg | ORAL_TABLET | Freq: Two times a day (BID) | ORAL | Status: DC | PRN
  Administered 2017-08-07 – 2017-08-10 (×6): 100 mg via ORAL
  Filled 2017-08-06 (×7): qty 2

## 2017-08-06 MED ORDER — SODIUM CHLORIDE 0.9 % IV SOLN
INTRAVENOUS | Status: DC
  Administered 2017-08-08 – 2017-08-09 (×2): via INTRAVENOUS

## 2017-08-06 MED ORDER — ALUM & MAG HYDROXIDE-SIMETH 200-200-20 MG/5ML PO SUSP
30.0000 mL | ORAL | Status: DC | PRN
Start: 2017-08-06 — End: 2017-08-10
  Administered 2017-08-06: 30 mL via ORAL
  Filled 2017-08-06: qty 30

## 2017-08-06 MED ORDER — PEG 3350-KCL-NA BICARB-NACL 420 G PO SOLR
4000.0000 mL | Freq: Once | ORAL | Status: AC
  Administered 2017-08-07: 4000 mL via ORAL
  Filled 2017-08-06: qty 4000

## 2017-08-06 MED ORDER — TRAMADOL HCL 50 MG PO TABS
ORAL_TABLET | ORAL | Status: AC
  Filled 2017-08-06: qty 2

## 2017-08-06 NOTE — Progress Notes (Signed)
Family Medicine Teaching Service Daily Progress Note Intern Pager: [phone]  Patient name: Mitchell Garcia Medical record number: [id number] Date of birth: [birth year] Age: 72 y.o. Gender: male  Primary Care Provider: [name], MD Consultants: nephrology, orthopedics, wound care  Code Status: Full   Pt Overview and Major Events to Date:  7/1 - admitted to [facility] for cellulitis and chronic leg wound 7/6 biopsy by general surgery 7/8 ID stop all antibiotics 7/9 biopsy results suggestive of ulcerative angiodermatitis 7/9 patient had bowel movement with bright red blood 7/11 patient transfused with 2 units packed red blood cells  Assessment and Plan: Mitchell Kloepfer Parrishis a 72 y.o.malepresenting with cellulitis of leg. PMH is significant for ESRD on HD, T2DM, anemia, combined systolic/diastolic HF, CAD with history of NSTEMI, chronic leg wound.  The admitting complaint of cellulitis is found to be stasis dermatitis, patient remains in hospital for work-up of GI bleed.  Rectal Bleeding: Stable  Hemoglobin this morning 7.5, down from 9.4 yesterday posttransfusion.  Status post 2 units packed red blood cells on 7/11.  - Continue to hold Plavix, ASA, lovenox - SCD on left leg - Hemoglobin: 7.5 (7/12 a.m.) > 8.2 (7/12 p.m.) -> 7.7 (7/13) - colonoscopy on Monday (10am) and follow-up EGD if colonoscopy is negative. - Begin bowel prep 7/14  Angiodermatitis, ulcer stable. Biopsy suggestive of ulcerative angiodermatitis and is without hallmarks of calciphylaxis or infection.  Currently being treated with topical steroid cream per dermatology recommendation.  Not currently on antibiotics per ID recommendation.  Today appears stable.  No new wounds noted from yesterday. - WBC: 18.0 (7/1) > 14.3 (7/7) > 14.2 (7/11) > 17.4 (7/12) - Follow-up with Derm at [facility] - Continue topical steroid cream to intact skin of right lower extremity -Continue wound care  Knee pain: Improving Pain and range of  motion in right knee improving.   - continue oxycodone 5mg  q8hrs prn for pain - ice packs prn for knee pain  ESRD on HD: Stable  TTS schedule. - Nephrology following for HD - Avoid nephrotoxic agents. - Continue to follow daily BMP - Continue to monitor strict I/O's - continue home renal meds  Poorly controlled T2DM: Improving Chronic. Last A1c 7.3 on 7/8.  This morning blood glucose 136. - Continue mSSIwith evening coverage - Continue to monitor CBG AC/at bedtime  Anemia of chronic disease: Worsening Baseline about 8.  On darbepoetin 60 MCG started on 7/4.  See Rectal Bleeding problem. - continue to monitor  Combined HF: Chronic Most recent Echo 07/09/17 showing LVEF 40-45%. - continue to monitor strict I/O's - continue home HF medications  CAD with history of NSTEMI: Chronic S/P Stent x2. Left Heart Cath 9/14 significant for multivessel disease with proximal LAD stenosis of 70%, total occlusion of mid LCx, and 95% of RCA stenosis.  -Currently holding aspirin and Plavix due to rectal bleeding -Continue statin, nitro, Coreg  Hyponatremia 135 on 7/5, has trended down to 129 on 7/8.  This AM 131. Patient ESRD on HD. - continue to monitor with daily BMP  Leukocytosis: Persistent Likely demargination, neutrophils 15.4 on differential 7/1.  Doubt infection as patient has been adequately treated by antibiotics for cellulitis, doubt malignancy as no constitutional symptoms, patient is not currently on oral steroids.  He has a chronic leg wound on his right lower extremity. -Continue to monitor with daily CBC  FEN/GI:renal/carb modified diet Prophylaxis:SCD on left leg  Disposition: SNF pending placement at [facility] following colonoscopy 7/15  Subjective:  Patient was seen  this morning.  Patient feels minor subjective improvements.  No new complaints this morning.  Patient continues to mention pain in his knee and right lower leg.  Patient was informed about his upcoming  colonoscopy on 7/15.  Objective: Temp:  [98 F (36.7 C)-98.3 F (36.8 C)] 98 F (36.7 C) (07/13 0449) Pulse Rate:  [72-85] 72 (07/13 0449) Resp:  [18] 18 (07/13 0449) BP: (110-137)/(64-91) 110/64 (07/13 0449) SpO2:  [96 %-98 %] 97 % (07/13 0449) Weight:  [99 kg (218 lb 4.1 oz)] 99 kg (218 lb 4.1 oz) (07/13 0445)   Physical exam: General: Alert and cooperative and appears to be in no acute distress.  Patient resting in bed comfortably.  Patient was able to lift right leg off of the bed today.  Demonstrated some improvement.  Patient patient appears to have less pain this morning compared to previous encounters. HEENT: Neck non-tender without lymphadenopathy, masses or thyromegaly.  Left pupil slightly more dilated than right. Both pupils round and reactive to light. Cardio: Frequent dropped beats on auscultation. No murmurs or rubs.   Pulm: Clear to auscultation bilaterally, no crackles, wheezing, or diminished breath sounds. Normal respiratory effort Abdomen: Bowel sounds normal. Abdomen soft and non-tender.  Extremities: No peripheral edema. Warm/ well perfused.  Strong radial pulses. Neuro: Cranial nerves grossly intact   Laboratory: Recent Labs  Lab 08/04/17 0406 08/04/17 1419 08/05/17 0523 08/05/17 1627  WBC 14.2*  --  15.7* 17.4*  HGB 6.6* 9.4* 7.5* 8.2*  HCT 21.4* 29.9* 24.1* 26.7*  PLT 370  --  275 358   Recent Labs  Lab 08/03/17 0335 08/04/17 0406 08/05/17 0523  NA 130* 131* 134*  K 3.6 3.8 3.9  CL 92* 91* 99  CO2 25 28 28   BUN 24* 32* 19  CREATININE 3.95* 5.31* 3.90*  CALCIUM 7.4* 7.2* 7.3*  GLUCOSE 143* 208* 136*     Imaging/Diagnostic Tests: Dg Chest 1 View  Result Date: 08/05/2017 CLINICAL DATA:  Cough today. EXAM: CHEST  1 VIEW COMPARISON:  PA and lateral chest 07/07/2017, 05/26/2017 and 10/04/2016. FINDINGS: There is cardiomegaly without edema. Aortic atherosclerosis is noted. No pneumothorax or pleural effusion. Right IJ approach dialysis catheter  is unchanged since the most recent exam. IMPRESSION: No acute disease. Cardiomegaly. Atherosclerosis. Electronically Signed   By: Inge Rise M.D.   On: 08/05/2017 13:09   Dg Chest 2 View  Result Date: 07/07/2017 CLINICAL DATA:  Syncope with fall this morning. Shortness of breath and dizziness. EXAM: CHEST - 2 VIEW COMPARISON:  05/26/2017 FINDINGS: Right IJ central venous catheter unchanged with tip over the SVC. Lungs are adequately inflated without focal consolidation or effusion. Mild stable cardiomegaly. Remainder of the exam is unchanged. IMPRESSION: No acute findings. Stable cardiomegaly.  Right IJ central venous catheter unchanged. Electronically Signed   By: Marin Olp M.D.   On: 07/07/2017 19:45   Dg Knee 1-2 Views Right  Result Date: 07/25/2017 CLINICAL DATA:  Wound cellulitis. EXAM: RIGHT KNEE - 1-2 VIEW COMPARISON:  07/25/2017 FINDINGS: No joint effusion identified. Mild diffuse soft tissue swelling noted. Vascular calcifications are noted no fracture or dislocation identified. No focal bone erosions. IMPRESSION: No focal bone abnormality. Soft tissue swelling. Electronically Signed   By: Kerby Moors M.D.   On: 07/25/2017 17:26   Dg Tibia/fibula Right  Result Date: 07/25/2017 CLINICAL DATA:  Soft tissue wounds about the right knee and lower leg. EXAM: RIGHT TIBIA AND FIBULA - 2 VIEW COMPARISON:  None. FINDINGS: No acute bony or  joint abnormality is identified. No soft tissue gas or radiopaque foreign body is seen. Atherosclerosis is noted. IMPRESSION: No acute abnormality. Atherosclerosis. Electronically Signed   By: Inge Rise M.D.   On: 07/25/2017 17:28   Ct Head Wo Contrast  Result Date: 07/07/2017 CLINICAL DATA:  Fall today, striking head/face on asphalt. Left facial pain. EXAM: CT HEAD WITHOUT CONTRAST CT CERVICAL SPINE WITHOUT CONTRAST TECHNIQUE: Multidetector CT imaging of the head and cervical spine was performed following the standard protocol without intravenous  contrast. Multiplanar CT image reconstructions of the cervical spine were also generated. COMPARISON:  CT head from 01/25/2013 FINDINGS: CT HEAD FINDINGS Brain: The brainstem, cerebellum, cerebral peduncles, thalami, basal ganglia, basilar cisterns, and ventricular system appear within normal limits. No intracranial hemorrhage, mass lesion, or acute CVA. Vascular: There is atherosclerotic calcification of the cavernous carotid arteries bilaterally. Skull: Unremarkable Sinuses/Orbits: Chronic ethmoid sinusitis. Opacification of the visualized right maxillary sinus. Other: No supplemental non-categorized findings. CT CERVICAL SPINE FINDINGS Alignment: No vertebral subluxation is observed. Skull base and vertebrae: Degenerative endplate sclerosis at E9-3 with loss of disc height at C3-4, C5-6, C6-7, and C7-T1. Notable spurring in the cervical and thoracic spine. Soft tissues and spinal canal: Enlarged right thyroid lobe with a suspected 2.4 cm right thyroid nodule on image 80/5. Bilateral common carotid atherosclerotic calcification. Disc levels: There is suspected osseous foraminal narrowing on the left at C3-4, C4-5, C5-6, and C6-7; and on the right at C5-6. I cannot exclude a central disc protrusion at C5-6. Upper chest: Atherosclerotic calcification of the aortic arch and branch vessels. Right upper paratracheal lymph node borderline enlarged at 1.0 cm on image 89/5. Other: No supplemental non-categorized findings. IMPRESSION: 1. No acute intracranial findings or acute cervical spine findings. 2. 2.4 cm right thyroid lobe nodule. Consider further evaluation with thyroid ultrasound. If patient is clinically hyperthyroid, consider nuclear medicine thyroid uptake and scan. 3. Cervical spondylosis and degenerative disc disease with potential impingement at C3-4, C4-5, C5-6, and C6-7. 4. Atherosclerosis. 5. Upper normal sized right upper paratracheal lymph node, nonspecific. 6. Chronic ethmoid sinusitis. There is also  opacification of visualized right maxillary sinus. Electronically Signed   By: Van Clines M.D.   On: 07/07/2017 13:13   Ct Cervical Spine Wo Contrast  Result Date: 07/07/2017 CLINICAL DATA:  Fall today, striking head/face on asphalt. Left facial pain. EXAM: CT HEAD WITHOUT CONTRAST CT CERVICAL SPINE WITHOUT CONTRAST TECHNIQUE: Multidetector CT imaging of the head and cervical spine was performed following the standard protocol without intravenous contrast. Multiplanar CT image reconstructions of the cervical spine were also generated. COMPARISON:  CT head from 01/25/2013 FINDINGS: CT HEAD FINDINGS Brain: The brainstem, cerebellum, cerebral peduncles, thalami, basal ganglia, basilar cisterns, and ventricular system appear within normal limits. No intracranial hemorrhage, mass lesion, or acute CVA. Vascular: There is atherosclerotic calcification of the cavernous carotid arteries bilaterally. Skull: Unremarkable Sinuses/Orbits: Chronic ethmoid sinusitis. Opacification of the visualized right maxillary sinus. Other: No supplemental non-categorized findings. CT CERVICAL SPINE FINDINGS Alignment: No vertebral subluxation is observed. Skull base and vertebrae: Degenerative endplate sclerosis at Y1-0 with loss of disc height at C3-4, C5-6, C6-7, and C7-T1. Notable spurring in the cervical and thoracic spine. Soft tissues and spinal canal: Enlarged right thyroid lobe with a suspected 2.4 cm right thyroid nodule on image 80/5. Bilateral common carotid atherosclerotic calcification. Disc levels: There is suspected osseous foraminal narrowing on the left at C3-4, C4-5, C5-6, and C6-7; and on the right at C5-6. I cannot exclude a central disc  protrusion at C5-6. Upper chest: Atherosclerotic calcification of the aortic arch and branch vessels. Right upper paratracheal lymph node borderline enlarged at 1.0 cm on image 89/5. Other: No supplemental non-categorized findings. IMPRESSION: 1. No acute intracranial findings  or acute cervical spine findings. 2. 2.4 cm right thyroid lobe nodule. Consider further evaluation with thyroid ultrasound. If patient is clinically hyperthyroid, consider nuclear medicine thyroid uptake and scan. 3. Cervical spondylosis and degenerative disc disease with potential impingement at C3-4, C4-5, C5-6, and C6-7. 4. Atherosclerosis. 5. Upper normal sized right upper paratracheal lymph node, nonspecific. 6. Chronic ethmoid sinusitis. There is also opacification of visualized right maxillary sinus. Electronically Signed   By: Van Clines M.D.   On: 07/07/2017 13:13   Ct Extremity Lower Right Wo Contrast  Result Date: 07/29/2017 CLINICAL DATA:  Lower extremity cellulitis.  Penetrating trauma EXAM: CT OF THE LOWER RIGHT EXTREMITY WITHOUT CONTRAST TECHNIQUE: Multidetector CT imaging of the right lower extremity was performed. Today's exam from the iliac crest through the toes would typically require at least 3 separate CT examinations as there are multiple regions encompassed, each with their own procedure code. There is no single defined CPT code aside from a CT angiogram runoff that includes the whole pelvis, thigh, leg, and foot. As a courtesy this single time, we are performing and reporting this as one CT scan. In the future, individual regions of the extremity need their own order, such as thigh, tibia/fibula, and foot. Reconstructions in this case are not ideal for assessment of individual structures such as joints, this is a large field survey and not a detailed assessment of any of the included joints. The knee is moderately flexed during imaging, resulting in non ideal axial images particularly through the calf. COMPARISON:  None. FINDINGS: Bones/Joint/Cartilage No osteomyelitis seen, low sensitivity in assessing the toes. No definite bony destructive findings. Mild osteoarthritis of the knee. Trace knee effusion. Ligaments Not assessed by CT. Muscles and Tendons No appreciable drainable  abscess is observed on today's noncontrast assessment. Soft tissues Subcutaneous edema most notable along the proximal calf, with a suggestion of some skin disruption posteriorly along the mid calf. I do not see a foreign body. There may be some very minimal subcutaneous gas adjacent to the skin disruption for example on image 258/4. Notable arterial atherosclerotic calcification. Mild subcutaneous edema tracking along the dorsum of the foot. IMPRESSION: 1. No findings of abscess or osteomyelitis. 2. Skin disruption along the posterior calf with some confluent regional subcutaneous edema potentially reflecting cellulitis. 3. Notable atherosclerotic calcification. 4. Dorsal subcutaneous edema in the foot, nonspecific. Electronically Signed   By: Van Clines M.D.   On: 07/29/2017 17:07    Matilde Haymaker, MD 08/06/2017, 5:14 AM PGY-1, [place] Intern pager: [phone], text pages welcome

## 2017-08-06 NOTE — Progress Notes (Signed)
Mitchell Garcia 11:12 AM  Subjective: Patient doing well on any new complaints and no signs of bleeding  Objective: Total signs stable afebrile no acute distress abdomen is soft nontender hemoglobin slight drop not iron deficient more of anemia of chronic disease  Assessment: Guaiac positive anemia  Plan: Will plan colonoscopy on Monday and if nondiagnostic and well-tolerated an endoscopy as well  [facility] E  Pager [phone] After 5PM or if no answer call [phone]

## 2017-08-06 NOTE — Progress Notes (Signed)
[facility] Progress Note    Assessment/ Plan:   1 ESRD - Last HD 7/11 -  HD today 7/13 to cont TTS regimen. 2 Leg ulcer bx- fortunately not calciphylaxis.  - He's not getting OOB or bearing weight bec of the rt knee pain. Pt thinks he will need to go to a facility as his wife will not be able to take care of him bec of the immobility.  - right leg ACE wrapped   3 Anemia stable- check iron panel 4 HPTH- on Renvela 3 tabs TIDM - repeat phos (not resulted checked at 1030am)  5 CAD 6 PVD     Subjective:   C/o rt leg pain and difficulty walkingbec of rt knee pain (locked up). Denies f/c/n/v/dyspnea but he's getting increasing frustrated bec of the rt knee and inability to bear weight.   Pain with warm compress around rt leg skin lesions   Objective:   BP 110/64 (BP Location: Left Arm)   Pulse 72   Temp 98 F (36.7 C) (Oral)   Resp 18   Ht 5\' 11"  (1.803 m)   Wt 99 kg (218 lb 4.1 oz)   SpO2 97%   BMI 30.44 kg/m   Intake/Output Summary (Last 24 hours) at 08/06/2017 1030 Last data filed at 08/06/2017 0805 Gross per 24 hour  Intake 420 ml  Output 100 ml  Net 320 ml   Weight change: 1.2 kg (2 lb 10.3 oz)  Physical Exam: General appearance:alert, cooperative, no distress, mildly obese and pale Resp:diminished breath soundsbilaterally Chest wall:RIJ PC Cardio:S1, S2 normal and systolic murmur:systolic [id number]/4,[id number] 2nd left intercostal space AO:ZHYQ, non-tender; bowel sounds normal; no masses, no organomegaly Extremities:BAndage R calf, erythema adj going into thigh; rt knee mobile but pain on passive flexion Access: rt cimino with good bruit (inflow lesion)    Imaging: Dg Chest 1 View  Result Date: 08/05/2017 CLINICAL DATA:  Cough today. EXAM: CHEST  1 VIEW COMPARISON:  PA and lateral chest 07/07/2017, 05/26/2017 and 10/04/2016. FINDINGS: There is cardiomegaly without edema. Aortic atherosclerosis is noted. No  pneumothorax or pleural effusion. Right IJ approach dialysis catheter is unchanged since the most recent exam. IMPRESSION: No acute disease. Cardiomegaly. Atherosclerosis. Electronically Signed   By: Inge Rise M.D.   On: 08/05/2017 13:09    Labs: BMET Recent Labs  Lab 08/01/17 0612 08/02/17 6578 08/02/17 0740 08/03/17 0335 08/04/17 0406 08/05/17 0523 08/06/17 0420  NA 129* 124* 126* 130* 131* 134* 134*  K 4.2 4.3 4.4 3.6 3.8 3.9 4.2  CL 91* 88* 88* 92* 91* 99 96*  CO2 25 18* 24 25 28 28 27   GLUCOSE 141* 115* 113* 143* 208* 136* 168*  BUN 42* 51* 51* 24* 32* 19 33*  CREATININE 5.21* 6.14* 6.11* 3.95* 5.31* 3.90* 5.38*  CALCIUM 7.9* 7.8* 7.9* 7.4* 7.2* 7.3* 7.8*  PHOS  --   --  5.9*  --   --   --   --    CBC Recent Labs  Lab 08/04/17 0406 08/04/17 1419 08/05/17 0523 08/05/17 1627 08/06/17 0420  WBC 14.2*  --  15.7* 17.4* 15.2*  HGB 6.6* 9.4* 7.5* 8.2* 7.7*  HCT 21.4* 29.9* 24.1* 26.7* 25.0*  MCV 94.3  --  94.5 95.4 95.1  PLT 370  --  275 358 303    Medications:    . sodium chloride   Intravenous Once  . acetaminophen  650 mg Oral Q6H  . atorvastatin  80 mg Oral QHS  . Chlorhexidine  Gluconate Cloth  6 each Topical [id number]  . cinacalcet  30 mg Oral Q T,Th,Sa-HD  . darbepoetin (ARANESP) injection - DIALYSIS  60 mcg Intravenous Q Tue-HD  . gabapentin  300 mg Oral QHS  . insulin aspart  0-15 Units Subcutaneous TID WC  . insulin aspart  0-5 Units Subcutaneous QHS  . isosorbide-hydrALAZINE  1 tablet Oral BID  . multivitamin  1 tablet Oral QHS  . primidone  100 mg Oral Daily  . sevelamer carbonate  2,400 mg Oral TID WC  . triamcinolone cream   Topical BID  . vitamin B-12  1,000 mcg Oral Daily      Otelia Santee, MD 08/06/2017, 10:30 AM

## 2017-08-07 LAB — CBC WITH DIFFERENTIAL/PLATELET
ABS IMMATURE GRANULOCYTES: 0.1 10*3/uL (ref 0.0–0.1)
Basophils Absolute: 0 10*3/uL (ref 0.0–0.1)
Basophils Relative: 0 %
Eosinophils Absolute: 0.2 10*3/uL (ref 0.0–0.7)
Eosinophils Relative: 1 %
HEMATOCRIT: 24.7 % — AB (ref 39.0–52.0)
HEMOGLOBIN: 7.8 g/dL — AB (ref 13.0–17.0)
IMMATURE GRANULOCYTES: 1 %
LYMPHS ABS: 1.4 10*3/uL (ref 0.7–4.0)
LYMPHS PCT: 10 %
MCH: 30 pg (ref 26.0–34.0)
MCHC: 31.6 g/dL (ref 30.0–36.0)
MCV: 95 fL (ref 78.0–100.0)
MONOS PCT: 9 %
Monocytes Absolute: 1.3 10*3/uL — ABNORMAL HIGH (ref 0.1–1.0)
Neutro Abs: 11.2 10*3/uL — ABNORMAL HIGH (ref 1.7–7.7)
Neutrophils Relative %: 79 %
Platelets: 285 10*3/uL (ref 150–400)
RBC: 2.6 MIL/uL — ABNORMAL LOW (ref 4.22–5.81)
RDW: 15.4 % (ref 11.5–15.5)
WBC: 14.2 10*3/uL — ABNORMAL HIGH (ref 4.0–10.5)

## 2017-08-07 LAB — GLUCOSE, CAPILLARY
GLUCOSE-CAPILLARY: 127 mg/dL — AB (ref 70–99)
GLUCOSE-CAPILLARY: 150 mg/dL — AB (ref 70–99)
GLUCOSE-CAPILLARY: 198 mg/dL — AB (ref 70–99)
Glucose-Capillary: 156 mg/dL — ABNORMAL HIGH (ref 70–99)

## 2017-08-07 LAB — RENAL FUNCTION PANEL
ANION GAP: 10 (ref 5–15)
Albumin: 2.3 g/dL — ABNORMAL LOW (ref 3.5–5.0)
BUN: 15 mg/dL (ref 8–23)
CO2: 26 mmol/L (ref 22–32)
Calcium: 7.6 mg/dL — ABNORMAL LOW (ref 8.9–10.3)
Chloride: 98 mmol/L (ref 98–111)
Creatinine, Ser: 3.26 mg/dL — ABNORMAL HIGH (ref 0.61–1.24)
GFR, EST AFRICAN AMERICAN: 20 mL/min — AB (ref >60)
GFR, EST NON AFRICAN AMERICAN: 17 mL/min — AB (ref >60)
Glucose, Bld: 139 mg/dL — ABNORMAL HIGH (ref 70–99)
PHOSPHORUS: 3 mg/dL (ref 2.5–4.6)
POTASSIUM: 4 mmol/L (ref 3.5–5.1)
Sodium: 134 mmol/L — ABNORMAL LOW (ref 135–145)

## 2017-08-07 LAB — HAPTOGLOBIN: HAPTOGLOBIN: 402 mg/dL — AB (ref 34–200)

## 2017-08-07 MED ORDER — SODIUM CHLORIDE 0.9 % IV SOLN
125.0000 mg | INTRAVENOUS | Status: DC
  Filled 2017-08-07: qty 10

## 2017-08-07 NOTE — Progress Notes (Signed)
Mitchell Garcia 2:00 PM  Subjective: Patient only had a few sips of GoLYTELY and we had a long talk about the need to drink it slowly and he has no new complaintsand has not had a bowel movement today  Objective: Vital signs stable afebrile abdomen is soft nontender hemoglobin stable  Assessment: Guaiac positive anemia  Plan: I will need a hospital team the nursing staff and the renal doctors to assist with encouraging him to drink the prep however I'm happy to proceed with an endoscopy tomorrow in place some of the prep and his duodenum which I discussed with him and then we can work on prepping him over the course of next week and I explained the need for getting cleaned out to do the procedure  [facility] E  Pager [phone] After 5PM or if no answer call [phone]

## 2017-08-07 NOTE — Progress Notes (Signed)
Family Medicine Teaching Service Daily Progress Note Intern Pager: [phone]  Patient name: Mitchell Garcia Medical record number: [id number] Date of birth: 1945-12-22 Age: 72 y.o. Gender: male  Primary Care Provider: [name], MD Consultants: nephrology, orthopedics, wound care  Code Status: Full   Pt Overview and Major Events to Date:  7/1 admitted to [facility] for cellulitis and chronic leg wound 7/6 biopsy by general surgery 7/8 ID stop all antibiotics 7/9 biopsy results suggestive of ulcerative angiodermatitis 7/9 patient had bowel movement with bright red blood 7/11 patient transfused with 2 units packed red blood cells  Assessment and Plan: Mitchell Mathieson Parrishis a 72 y.o.malepresenting with cellulitis of leg. PMH is significant for ESRD on HD, T2DM, anemia, combined systolic/diastolic HF, CAD with history of NSTEMI, chronic leg wound.  The admitting complaint of cellulitis is found to be stasis dermatitis, patient remains in hospital for work-up of GI bleed.  Rectal Bleeding: Stable  Status post 2 units packed red blood cells on 7/11. No new rectal bleeding overnight. Hgb this am 7.8, stable from yesterday. Patient is currently declining bowel prep.  - Continue to hold Plavix, ASA, lovenox - SCD on left leg - plan for colonoscopy on Monday (10am) however patient currently declining, I'll discuss with family and GI for definitive plan - follow-up EGD if colonoscopy is negative.  Angiodermatitis, ulcer- stable. Biopsy suggestive of ulcerative angiodermatitis and is without hallmarks of calciphylaxis or infection.  Currently being treated with topical steroid cream per dermatology recommendation.  - Follow-up with Derm at [place] topical steroid cream to intact skin of right lower extremity - Continue wound care  Knee pain: Improving Pain and range of motion in right knee improving.   - continue tramadol and oxycodone 5mg  q8hrs prn for pain - ice packs prn for knee  pain  ESRD on HD: Stable  TTS schedule. - Nephrology following for HD - Avoid nephrotoxic agents. - Continue to follow daily BMP - Continue to monitor strict I/O's - continue home renal meds  Poorly controlled T2DM: Improving Chronic. Last A1c 7.3 on 7/8.  This morning blood glucose 127 - Continue mSSIwith evening coverage - Continue to monitor CBG AC/at bedtime  Anemia of chronic disease: Worsening Baseline about 8. This morning Hgb 7.8. On darbepoetin 60 MCG started on 7/4.  See Rectal Bleeding problem. - continue to monitor  Combined HF: Chronic Most recent Echo 07/09/17 showing LVEF 40-45%. - continue to monitor strict I/O's - continue home HF medications  CAD with history of NSTEMI: Chronic S/P Stent x2. Left Heart Cath 9/14 significant for multivessel disease with proximal LAD stenosis of 70%, total occlusion of mid LCx, and 95% of RCA stenosis.  -Currently holding aspirin and Plavix due to rectal bleeding, colonoscopy 7/15 -Continue statin, nitro, Coreg  Hyponatremia- improving. This AM 134. Patient ESRD on HD. - continue to monitor with daily BMP  Leukocytosis: Persistent Likely demargination, neutrophils 15.4 on differential 7/1.  Doubt infection as patient has been adequately treated by antibiotics for cellulitis, doubt malignancy as no constitutional symptoms, patient is not currently on oral steroids.  He has a chronic leg wound on his right lower extremity. -Continue to monitor with daily CBC  FEN/GI:renal/carb modified diet Prophylaxis:SCD on left leg  Disposition: SNF pending placement at [facility] following colonoscopy 7/15  Subjective:  Patient is very upset at bowel prep today. He states he is too weak to continue and complete bowel prep. He states he is too weak for colonoscopy. He would prefer  to defer this. He states his rectal bleeding is from the new diet at hospital and once he goes home and eats "normal food" his bleeding will stop. He feels legs  are stable. No other complaints.   Objective: Temp:  [97.8 F (36.6 C)-98.6 F (37 C)] 98 F (36.7 C) (07/14 0455) Pulse Rate:  [57-84] 79 (07/14 0455) Resp:  [16-19] 18 (07/14 0455) BP: (96-149)/(57-106) 118/57 (07/14 0455) SpO2:  [95 %-99 %] 96 % (07/14 0455) Weight:  [211 lb 3.2 oz (95.8 kg)-216 lb 14.9 oz (98.4 kg)] 211 lb 3.2 oz (95.8 kg) (07/13 1710)    Gen: elderly chronically ill appearing man laying in bed in NAD Heart: RRR no MRG Lungs: CTA bilaterally, no increased work of breathing Abdomen: soft, non-tender, non-distended, +BS Extremities: minimal erythema on bilateral legs with scabbing present, +1 edema bilaterally  Neuro: no focal deficits  Laboratory: Recent Labs  Lab 08/05/17 1627 08/06/17 0420 08/07/17 0422  WBC 17.4* 15.2* 14.2*  HGB 8.2* 7.7* 7.8*  HCT 26.7* 25.0* 24.7*  PLT 358 303 285   Recent Labs  Lab 08/05/17 0523 08/06/17 0420 08/07/17 0422  NA 134* 134* 134*  K 3.9 4.2 4.0  CL 99 96* 98  CO2 28 27 26   BUN 19 33* 15  CREATININE 3.90* 5.38* 3.26*  CALCIUM 7.3* 7.8* 7.6*  GLUCOSE 136* 168* 139*    Imaging/Diagnostic Tests: Dg Chest 1 View IMPRESSION: No acute disease. Cardiomegaly. Atherosclerosis. Electronically Signed   By: Inge Rise M.D.   On: 08/05/2017 13:09   Dg Knee 1-2 Views Right IMPRESSION: No focal bone abnormality. Soft tissue swelling. Electronically Signed   By: Kerby Moors M.D.   On: 07/25/2017 17:26   Dg Tibia/fibula Right IMPRESSION: No acute abnormality. Atherosclerosis. Electronically Signed   By: Inge Rise M.D.   On: 07/25/2017 17:28   Ct Extremity Lower Right Wo Contrast 1. No findings of abscess or osteomyelitis. 2. Skin disruption along the posterior calf with some confluent regional subcutaneous edema potentially reflecting cellulitis. 3. Notable atherosclerotic calcification. 4. Dorsal subcutaneous edema in the foot, nonspecific. Electronically Signed   By: Van Clines M.D.   On:  07/29/2017 17:07   Steve Rattler, DO 08/07/2017, 8:25 AM PGY-3, [place] Intern pager: [phone], text pages welcome

## 2017-08-07 NOTE — Progress Notes (Signed)
Patient refused to take Golytely as ordered despite explanation.

## 2017-08-07 NOTE — Progress Notes (Signed)
Spoke at length with the patient about the importance of drinking colon prep for his colonoscopy tomorrow.  While in the room, patient was able to drink two cups of the colon prep and stated that he would continue as long as he didn't vomit.    Bernita Raisin Rittberger, DO 9:11 PM

## 2017-08-07 NOTE — Progress Notes (Signed)
[facility] Progress Note    Assessment/ Plan:   1 ESRD - Last HD 7/13  - Next HD tues to resume  TTS outpatient regimen; no acute indication for dialysis today.  2 Leg ulcer bx- fortunately not calciphylaxis.  - He's not getting OOB or bearing weight bec of the rt knee pain. Pt thinks he will need to go to a facility as his wife will not be able to take care of him bec of the immobility.  - right leg wrapped   3 Anemia stable- check iron panel -> nulecit 4 HPTH- on Renvela 3 tabs TIDM - repeat phos (not resulted checked at 1030am)  5 CAD 6 PVD    Subjective:    C/o rt leg pain and difficulty walkingbec of rt knee pain (locked up). Denies f/c/n/v/dyspneabut he's getting increasing frustrated bec of the rt knee and inability to bear weight.  Pain with warm compress around rt leg skin lesions      Objective:   BP (!) 118/57 (BP Location: Left Arm)   Pulse 79   Temp 98 F (36.7 C) (Oral)   Resp 18   Ht 5\' 11"  (1.803 m)   Wt 95.8 kg (211 lb 3.2 oz)   SpO2 96%   BMI 29.46 kg/m   Intake/Output Summary (Last 24 hours) at 08/07/2017 1307 Last data filed at 08/07/2017 4268 Gross per 24 hour  Intake 360 ml  Output 2150 ml  Net -1790 ml   Weight change: -0.6 kg (-1 lb 5.2 oz)  Physical Exam: General appearance:alert, cooperative, no distress, mildly obese and pale Resp:diminished breath soundsbilaterally Chest wall:RIJ PC Cardio:S1, S2 normal and systolic murmur:systolic [id number]/7,LGXQJJHERDEYC 2nd left intercostal space XK:GYJE, non-tender; bowel sounds normal Extremities:R calf and leg wrapped, erythema adj going into thigh Access: rt cimino with good bruit (inflow lesion)    Imaging: No results found.  Labs: BMET Recent Labs  Lab 08/02/17 5631 08/02/17 0740 08/03/17 0335 08/04/17 0406 08/05/17 0523 08/06/17 0420 08/06/17 1110 08/07/17 0422  NA 124* 126* 130* 131* 134* 134*  --  134*  K 4.3 4.4 3.6 3.8 3.9  4.2  --  4.0  CL 88* 88* 92* 91* 99 96*  --  98  CO2 18* 24 25 28 28 27   --  26  GLUCOSE 115* 113* 143* 208* 136* 168*  --  139*  BUN 51* 51* 24* 32* 19 33*  --  15  CREATININE 6.14* 6.11* 3.95* 5.31* 3.90* 5.38*  --  3.26*  CALCIUM 7.8* 7.9* 7.4* 7.2* 7.3* 7.8*  --  7.6*  PHOS  --  5.9*  --   --   --   --  3.4 3.0   CBC Recent Labs  Lab 08/05/17 0523 08/05/17 1627 08/06/17 0420 08/07/17 0422  WBC 15.7* 17.4* 15.2* 14.2*  NEUTROABS  --   --   --  11.2*  HGB 7.5* 8.2* 7.7* 7.8*  HCT 24.1* 26.7* 25.0* 24.7*  MCV 94.5 95.4 95.1 95.0  PLT 275 358 303 285    Medications:    . sodium chloride   Intravenous Once  . acetaminophen  650 mg Oral Q6H  . atorvastatin  80 mg Oral QHS  . Chlorhexidine Gluconate Cloth  6 each Topical Q0600  . cinacalcet  30 mg Oral Q T,Th,Sa-HD  . darbepoetin (ARANESP) injection - DIALYSIS  60 mcg Intravenous Q Tue-HD  . gabapentin  300 mg Oral QHS  . insulin aspart  0-15 Units Subcutaneous TID WC  .  insulin aspart  0-5 Units Subcutaneous QHS  . isosorbide-hydrALAZINE  1 tablet Oral BID  . multivitamin  1 tablet Oral QHS  . primidone  100 mg Oral Daily  . sevelamer carbonate  2,400 mg Oral TID WC  . triamcinolone cream   Topical BID  . vitamin B-12  1,000 mcg Oral Daily      Otelia Santee, MD 08/07/2017, 1:07 PM

## 2017-08-08 ENCOUNTER — Encounter (HOSPITAL_COMMUNITY): Payer: Self-pay | Admitting: *Deleted

## 2017-08-08 ENCOUNTER — Inpatient Hospital Stay (HOSPITAL_COMMUNITY): Payer: Non-veteran care | Admitting: Certified Registered"

## 2017-08-08 ENCOUNTER — Encounter (HOSPITAL_COMMUNITY)
Admission: EM | Disposition: A | Discharge status: Skilled Nursing Facility | Payer: Self-pay | Source: Home / Self Care | Attending: Family Medicine

## 2017-08-08 HISTORY — PX: ESOPHAGOGASTRODUODENOSCOPY (EGD) WITH PROPOFOL: SHX5813

## 2017-08-08 LAB — RENAL FUNCTION PANEL
ALBUMIN: 2.6 g/dL — AB (ref 3.5–5.0)
ANION GAP: 11 (ref 5–15)
BUN: 26 mg/dL — ABNORMAL HIGH (ref 8–23)
CHLORIDE: 93 mmol/L — AB (ref 98–111)
CO2: 29 mmol/L (ref 22–32)
Calcium: 8.1 mg/dL — ABNORMAL LOW (ref 8.9–10.3)
Creatinine, Ser: 4.73 mg/dL — ABNORMAL HIGH (ref 0.61–1.24)
GFR, EST AFRICAN AMERICAN: 13 mL/min — AB (ref >60)
GFR, EST NON AFRICAN AMERICAN: 11 mL/min — AB (ref >60)
Glucose, Bld: 142 mg/dL — ABNORMAL HIGH (ref 70–99)
PHOSPHORUS: 4.3 mg/dL (ref 2.5–4.6)
POTASSIUM: 4.8 mmol/L (ref 3.5–5.1)
Sodium: 133 mmol/L — ABNORMAL LOW (ref 135–145)

## 2017-08-08 LAB — CBC
HEMATOCRIT: 26.3 % — AB (ref 39.0–52.0)
Hemoglobin: 8.1 g/dL — ABNORMAL LOW (ref 13.0–17.0)
MCH: 30.1 pg (ref 26.0–34.0)
MCHC: 30.8 g/dL (ref 30.0–36.0)
MCV: 97.8 fL (ref 78.0–100.0)
Platelets: 305 10*3/uL (ref 150–400)
RBC: 2.69 MIL/uL — AB (ref 4.22–5.81)
RDW: 15.5 % (ref 11.5–15.5)
WBC: 13.7 10*3/uL — AB (ref 4.0–10.5)

## 2017-08-08 LAB — GLUCOSE, CAPILLARY
GLUCOSE-CAPILLARY: 158 mg/dL — AB (ref 70–99)
GLUCOSE-CAPILLARY: 163 mg/dL — AB (ref 70–99)
Glucose-Capillary: 137 mg/dL — ABNORMAL HIGH (ref 70–99)
Glucose-Capillary: 143 mg/dL — ABNORMAL HIGH (ref 70–99)

## 2017-08-08 SURGERY — ESOPHAGOGASTRODUODENOSCOPY (EGD) WITH PROPOFOL
Anesthesia: Monitor Anesthesia Care

## 2017-08-08 MED ORDER — LIDOCAINE 2% (20 MG/ML) 5 ML SYRINGE
INTRAMUSCULAR | Status: DC | PRN
  Administered 2017-08-08: 80 mg via INTRAVENOUS

## 2017-08-08 MED ORDER — PROPOFOL 10 MG/ML IV BOLUS
INTRAVENOUS | Status: DC | PRN
  Administered 2017-08-08: 40 mg via INTRAVENOUS

## 2017-08-08 MED ORDER — POLYETHYLENE GLYCOL 3350 17 G PO PACK
17.0000 g | PACK | Freq: Two times a day (BID) | ORAL | Status: DC
  Administered 2017-08-08 (×2): 17 g via ORAL
  Filled 2017-08-08 (×3): qty 1

## 2017-08-08 MED ORDER — LACTATED RINGERS IV SOLN
INTRAVENOUS | Status: AC | PRN
  Administered 2017-08-08: 1000 mL via INTRAVENOUS

## 2017-08-08 MED ORDER — SODIUM CHLORIDE 0.9 % IV SOLN
INTRAVENOUS | Status: DC
  Administered 2017-08-08 – 2017-08-09 (×2): via INTRAVENOUS

## 2017-08-08 MED ORDER — HYDROCORTISONE ACETATE 25 MG RE SUPP
25.0000 mg | Freq: Two times a day (BID) | RECTAL | Status: DC
Start: 2017-08-08 — End: 2017-08-10
  Administered 2017-08-08 – 2017-08-09 (×3): 25 mg via RECTAL
  Filled 2017-08-08 (×3): qty 1

## 2017-08-08 MED ORDER — CHLORHEXIDINE GLUCONATE CLOTH 2 % EX PADS
6.0000 | MEDICATED_PAD | Freq: Every day | CUTANEOUS | Status: DC
  Administered 2017-08-09 – 2017-08-10 (×2): 6 via TOPICAL

## 2017-08-08 MED ORDER — PANTOPRAZOLE SODIUM 40 MG PO TBEC
40.0000 mg | DELAYED_RELEASE_TABLET | Freq: Every day | ORAL | Status: DC
  Administered 2017-08-08 – 2017-08-10 (×3): 40 mg via ORAL
  Filled 2017-08-08 (×3): qty 1

## 2017-08-08 MED ORDER — PANTOPRAZOLE SODIUM 40 MG PO TBEC: 40.0000 mg | DELAYED_RELEASE_TABLET | Freq: Every day | ORAL | Status: DC

## 2017-08-08 MED ORDER — PROPOFOL 500 MG/50ML IV EMUL
INTRAVENOUS | Status: DC | PRN
  Administered 2017-08-08: 50 ug/kg/min via INTRAVENOUS

## 2017-08-08 SURGICAL SUPPLY — 15 items

## 2017-08-08 NOTE — Anesthesia Postprocedure Evaluation (Signed)
Anesthesia Post Note  Patient: Mitchell Garcia  Procedure(s) Performed: ESOPHAGOGASTRODUODENOSCOPY (EGD) WITH PROPOFOL (N/A )     Patient location during evaluation: Endoscopy Anesthesia Type: MAC Level of consciousness: awake and alert Pain management: pain level controlled Vital Signs Assessment: post-procedure vital signs reviewed and stable Respiratory status: spontaneous breathing, nonlabored ventilation, respiratory function stable and patient connected to nasal cannula oxygen Cardiovascular status: stable and blood pressure returned to baseline Postop Assessment: no apparent nausea or vomiting Anesthetic complications: no    Last Vitals:  Vitals:   08/08/17 1355 08/08/17 1445  BP: (!) 98/44 110/64  Pulse: 76   Resp:    Temp:    SpO2:      Last Pain:  Vitals:   08/08/17 1936  TempSrc:   PainSc: 8                  Barnet Glasgow

## 2017-08-08 NOTE — Anesthesia Preprocedure Evaluation (Addendum)
Anesthesia Evaluation  Patient identified by MRN, date of birth, ID band Patient awake    Reviewed: Allergy & Precautions, NPO status , Patient's Chart, lab work & pertinent test results, reviewed documented beta blocker date and time   Airway Mallampati: II  TM Distance: >3 FB Neck ROM: Full    Dental no notable dental hx. (+) Teeth Intact   Pulmonary neg pulmonary ROS,    Pulmonary exam normal breath sounds clear to auscultation       Cardiovascular hypertension, Pt. on medications and Pt. on home beta blockers + CAD, + Past MI, + Cardiac Stents and +CHF  Normal cardiovascular exam Rhythm:Regular Rate:Normal  Non STEMI 05/18/2017 Stent x 2 9/14 LAD prox 70% and prox RCA  Echo 07/09/17 showing LVEF 40-45%. Cath 09/2016-Left Holli Humbles is large. Vessel is angiographically normal. Left Anterior Descending Prox LAD lesion 50% stenosed Dist LAD lesion 20% stenosed First Diagonal Branch Vessel is moderate in size. 1st Diag lesion 70% stenosed Left CircumflexVessel is moderate in size. Mid Cx lesion 100% stenosed The lesion is chronically occluded with left-to-left collateral flow. First Obtuse Marginal BranchVessel is moderate in size. Right Coronary Artery Vessel is large. Ost RCA lesion 80% stenosed The lesion is eccentric. The lesion is moderately calcified. Ostial stenosis with pressure dampening a 34F diagnostic catheter. Mid RCA lesion 30% stenosed The lesion is irregular. Dist RCA lesion 95% stenosed Dist RCA lesion. Right Posterior Descending Artery Vessel is large in size. Right Posterior Atrioventricular Branch Vessel is large in size.     Neuro/Psych CVA, Residual Symptoms negative psych ROS   GI/Hepatic Gi Bleed   Endo/Other  diabetes, Well Controlled, Type 2, Oral Hypoglycemic Agents  Renal/GU ARF, Renal Insufficiency, Dialysis and ESRFRenal diseaseLast dialysis on Saturday 7/13      Musculoskeletal negative musculoskeletal ROS (+)   Abdominal   Peds  Hematology  (+) anemia , Plavix- last dose   Anesthesia Other Findings   Reproductive/Obstetrics                        Anesthesia Physical Anesthesia Plan  ASA: III  Anesthesia Plan: MAC   Post-op Pain Management:    Induction: Intravenous  PONV Risk Score and Plan: 1 and Propofol infusion, Ondansetron and Treatment may vary due to age or medical condition  Airway Management Planned: Natural Airway and Simple Face Mask  Additional Equipment:   Intra-op Plan:   Post-operative Plan:   Informed Consent: I have reviewed the patients History and Physical, chart, labs and discussed the procedure including the risks, benefits and alternatives for the proposed anesthesia with the patient or authorized representative who has indicated his/her understanding and acceptance.   Dental advisory given  Plan Discussed with: CRNA and Surgeon  Anesthesia Plan Comments:         Anesthesia Quick Evaluation

## 2017-08-08 NOTE — Progress Notes (Signed)
Continues to encourage to drink bowel prep. Pt stated that she been trying, but cannot drink anymore. Had 1 small soft BM at this shift. Will continue to monitor.

## 2017-08-08 NOTE — Progress Notes (Addendum)
[facility] ROUNDING NOTE   Subjective:   No acute events overnight. Patient doing overall well this morning. Continues to complain of pain in R knee and difficulty ambulating. Denies fever, chills, chest pain, shortness of breath, nausea, and vomiting.  He is going for colonoscopy today and has not completed bowel prep.   Objective:  Vital signs in last 24 hours:  Temp:  [98.3 F (36.8 C)-99.1 F (37.3 C)] 99.1 F (37.3 C) (07/15 1103) Pulse Rate:  [38-80] 80 (07/15 0919) Resp:  [15-17] 17 (07/15 1103) BP: (99-144)/(66-111) 99/81 (07/15 1103) SpO2:  [94 %-98 %] 97 % (07/15 1103) Weight:  [212 lb 15.4 oz (96.6 kg)] 212 lb 15.4 oz (96.6 kg) (07/15 0429)  Weight change: -3 lb 15.5 oz (-1.8 kg) Filed Weights   08/06/17 1300 08/06/17 1710 08/08/17 0429  Weight: 216 lb 14.9 oz (98.4 kg) 211 lb 3.2 oz (95.8 kg) 212 lb 15.4 oz (96.6 kg)    Intake/Output: I/O last 3 completed shifts: In: 720 [P.O.:720] Out: 100 [Urine:100]   Intake/Output this shift:  Total I/O In: 100 [I.V.:100] Out: 0   General: chronically ill appearing male, laying in bed in no acute distress, alert and oriented  CVS- RRR, holosystolic murmur II/VI  RS- diminished breath sounds bilaterally in part due to poor patient effort  ABD- soft, NTND, normoactive bowel sounds  EXT- RLE wrapped, trace edema, R cimino with palpable bruit    Basic Metabolic Panel: Recent Labs  Lab 08/02/17 0740 08/03/17 0335 08/04/17 0406 08/05/17 0523 08/06/17 0420 08/06/17 1110 08/07/17 0422  NA 126* 130* 131* 134* 134*  --  134*  K 4.4 3.6 3.8 3.9 4.2  --  4.0  CL 88* 92* 91* 99 96*  --  98  CO2 24 25 28 28 27   --  26  GLUCOSE 113* 143* 208* 136* 168*  --  139*  BUN 51* 24* 32* 19 33*  --  15  CREATININE 6.11* 3.95* 5.31* 3.90* 5.38*  --  3.26*  CALCIUM 7.9* 7.4* 7.2* 7.3* 7.8*  --  7.6*  PHOS 5.9*  --   --   --   --  3.4 3.0    Liver Function Tests: Recent Labs  Lab 08/02/17 0740 08/07/17 0422   ALBUMIN 2.2* 2.3*   No results for input(s): LIPASE, AMYLASE in the last 168 hours. No results for input(s): AMMONIA in the last 168 hours.  CBC: Recent Labs  Lab 08/04/17 0406 08/04/17 1419 08/05/17 0523 08/05/17 1627 08/06/17 0420 08/07/17 0422  WBC 14.2*  --  15.7* 17.4* 15.2* 14.2*  NEUTROABS  --   --   --   --   --  11.2*  HGB 6.6* 9.4* 7.5* 8.2* 7.7* 7.8*  HCT 21.4* 29.9* 24.1* 26.7* 25.0* 24.7*  MCV 94.3  --  94.5 95.4 95.1 95.0  PLT 370  --  275 358 303 285    Cardiac Enzymes: No results for input(s): CKTOTAL, CKMB, CKMBINDEX, TROPONINI in the last 168 hours.  BNP: Invalid input(s): POCBNP  CBG: Recent Labs  Lab 08/07/17 0720 08/07/17 1241 08/07/17 1719 08/07/17 2113 08/08/17 0803  GLUCAP 127* 150* 156* 198* 69*    Microbiology: Results for orders placed or performed during the hospital encounter of 07/25/17  Blood culture (routine x 2)     Status: None   Collection Time: 07/25/17 12:30 PM  Result Value Ref Range Status   Specimen Description BLOOD LEFT ANTECUBITAL  Final   Special Requests  Final    BOTTLES DRAWN AEROBIC ONLY Blood Culture adequate volume   Culture   Final    NO GROWTH 5 DAYS Performed at [facility], [place] [address] [place], [place] [address]    Report Status 07/30/2017 FINAL  Final  Blood culture (routine x 2)     Status: None   Collection Time: 07/25/17 12:38 PM  Result Value Ref Range Status   Specimen Description BLOOD SITE NOT SPECIFIED  Final   Special Requests   Final    BOTTLES DRAWN AEROBIC ONLY Blood Culture results may not be optimal due to an inadequate volume of blood received in culture bottles   Culture   Final    NO GROWTH 5 DAYS Performed at [facility], [place] [address], [place], [place] [address]    Report Status 07/30/2017 FINAL  Final  Culture, Urine     Status: Abnormal   Collection Time: 07/28/17  7:40 AM  Result Value Ref Range Status   Specimen Description URINE, RANDOM  Final    Special Requests   Final    NONE Performed at [facility], [place] [address], [place], [place] [address]    Culture MULTIPLE SPECIES PRESENT, SUGGEST RECOLLECTION (A)  Final   Report Status 07/29/2017 FINAL  Final    Coagulation Studies: No results for input(s): LABPROT, INR in the last 72 hours.  Urinalysis: No results for input(s): COLORURINE, LABSPEC, PHURINE, GLUCOSEU, HGBUR, BILIRUBINUR, KETONESUR, PROTEINUR, UROBILINOGEN, NITRITE, LEUKOCYTESUR in the last 72 hours.  Invalid input(s): APPERANCEUR    Imaging: No results found.   Medications:   . sodium chloride Stopped (08/08/17 1111)  . [MAR Hold] ferric gluconate (FERRLECIT/NULECIT) IV     . [MAR Hold] sodium chloride   Intravenous Once  . [MAR Hold] acetaminophen  650 mg Oral Q6H  . [MAR Hold] atorvastatin  80 mg Oral QHS  . [MAR Hold] Chlorhexidine Gluconate Cloth  6 each Topical Q0600  . [MAR Hold] cinacalcet  30 mg Oral Q T,Th,Sa-HD  . [MAR Hold] darbepoetin (ARANESP) injection - DIALYSIS  60 mcg Intravenous Q Tue-HD  . [MAR Hold] gabapentin  300 mg Oral QHS  . [MAR Hold] insulin aspart  0-15 Units Subcutaneous TID WC  . [MAR Hold] insulin aspart  0-5 Units Subcutaneous QHS  . [MAR Hold] isosorbide-hydrALAZINE  1 tablet Oral BID  . [MAR Hold] multivitamin  1 tablet Oral QHS  . [MAR Hold] primidone  100 mg Oral Daily  . [MAR Hold] sevelamer carbonate  2,400 mg Oral TID WC  . [MAR Hold] triamcinolone cream   Topical BID  . [MAR Hold] vitamin B-12  1,000 mcg Oral Daily   [MAR Hold] ALPRAZolam, [MAR Hold] alum & mag hydroxide-simeth, [MAR Hold] docusate sodium, [MAR Hold] lidocaine, [MAR Hold] menthol-cetylpyridinium, [MAR Hold] nitroGLYCERIN, [MAR Hold] ondansetron (ZOFRAN) IV, [MAR Hold] oxyCODONE, [MAR Hold] traMADol  Assessment/ Plan:  Mr. Claunch is a 72 yo M with history of CAD, NSTEMI s/p PCI x2, uncontrolled T2DM, ESRD on HD T/Th/S,combined systolic and diastolic HD who presented on 7/1 with cellulitis  and chronic RLE wound found to be angiodermatitis. Nephrology following for HD.   ESRD: Last HD 7/13. Next HD 7/16. Ordered CBC and renal function panel for tomorrow. Appears euvolemic on exam.    Anemia of CKD: Hgb 8-9 at baseline. Currently at 7.8. On Aranesp and Nulecit.   GI bleed: Required 1 unit of blood 7/11. Has not completed bowel prep. Per GI, endoscopy today  with plan for flex sig vs colonoscopy tomorrow for further evaluation.   RLE ulcer: Stable. Found to be angiodermatitis on skin biopsy. Off antibiotics.   MBD- On Renvela 2,400 mg TID and Sensipar. Phos 3.4 7/13   HTN: On Bidil 1 tablet BID   CAD and PVD: Holding ASA and Plavix in the setting of GI bleed     LOS: 14 Idalys Santos-Sanchez 11:12 AM  Renal Attending:  I agree with note as above.  Will plan for HD after colonoscopy on 7/16. Erling Cruz, MD

## 2017-08-08 NOTE — Progress Notes (Signed)
Mitchell Garcia 9:34 AM  Subjective: Patient did have 3 bowel movements yesterday and does see some bright red blood from his hemorrhoids but no other complaints and is in okay spirits today  Objective: Vital signs stable afebrile exam please see preassessment evaluation no new labs  Assessment: Chronic anemia with some bright red blood per rectum probably due to hemorrhoids  Plan: Will proceed with an endoscopy today and probably play some GoLYTELY in his duodenum and proceed tomorrow with flexible sigmoidoscopy and/or colonoscopy and he requests some hemorrhoids suppositories which has helped him in the past with further workup and plans pending above  [facility] E  Pager [phone] After 5PM or if no answer call [phone]

## 2017-08-08 NOTE — Progress Notes (Signed)
Family Medicine Teaching Service Daily Progress Note Intern Pager: [phone]  Patient name: Mitchell Garcia Medical record number: [id number] Date of birth: April 18, 1945 Age: 72 y.o. Gender: male  Primary Care Provider: [name], MD Consultants: nephrology, orthopedics, wound care  Code Status: Full   Pt Overview and Major Events to Date:  7/1 admitted to [facility] for cellulitis and chronic leg wound 7/6 biopsy by general surgery 7/8 ID stop all antibiotics 7/9 biopsy results suggestive of ulcerative angiodermatitis 7/9 patient had bowel movement with bright red blood 7/11 patient transfused with 2 units packed red blood cells  Assessment and Plan: Torell Minder Parrishis a 72 y.o.malepresenting with cellulitis of leg. PMH is significant for ESRD on HD, T2DM, anemia, combined systolic/diastolic HF, CAD with history of NSTEMI, chronic leg wound.  The admitting complaint of cellulitis is found to be stasis dermatitis, patient remains in hospital for work-up of GI bleed.  Rectal Bleeding: Stable  Status post 2 units packed red blood cells on 7/11. No new rectal bleeding overnight. Hgb this am 7.8(7/15), stable from yesterday. Patient is currently declining bowel prep.  - Continue to hold Plavix, ASA, lovenox - SCD on left leg - with family and GI for definitive plan - follow-up EGD if colonoscopy is negative. - Upper endoscopy completed today.  Pt to stay for lower endoscopy tomorrow (7/16)  Angiodermatitis, ulcer- stable. Biopsy suggestive of ulcerative angiodermatitis and is without hallmarks of calciphylaxis or infection.  Currently being treated with topical steroid cream per dermatology recommendation.  - Follow-up with Derm at [facility] topical steroid cream to intact skin of right lower extremity - Continue wound care  Knee pain: Improving Pain and range of motion in right knee improving.   - continue tramadol and oxycodone 5mg  q8hrs prn for pain - ice packs prn for knee  pain  ESRD on HD: Stable  TTS schedule. - Nephrology following for HD - Avoid nephrotoxic agents. - Continue to follow daily BMP - Continue to monitor strict I/O's - continue home renal meds  Poorly controlled T2DM: Improving Chronic. Last A1c 7.3 on 7/8.  This morning blood glucose 127 - Continue mSSIwith evening coverage - Continue to monitor CBG AC/at bedtime  Anemia of chronic disease: Worsening Baseline about 8. This morning Hgb 7.8. On darbepoetin 60 MCG started on 7/4.  See Rectal Bleeding problem. - continue to monitor  Combined HF: Chronic Most recent Echo 07/09/17 showing LVEF 40-45%. - continue to monitor strict I/O's - continue home HF medications  CAD with history of NSTEMI: Chronic S/P Stent x2. Left Heart Cath 9/14 significant for multivessel disease with proximal LAD stenosis of 70%, total occlusion of mid LCx, and 95% of RCA stenosis.  -Currently holding aspirin and Plavix due to rectal bleeding, colonoscopy 7/15 -Continue statin, nitro, Coreg  Hyponatremia- improving. This AM 134. Patient ESRD on HD. - continue to monitor with daily BMP  Leukocytosis: Persistent Likely demargination, neutrophils 15.4 on differential 7/1.  Doubt infection as patient has been adequately treated by antibiotics for cellulitis, doubt malignancy as no constitutional symptoms, patient is not currently on oral steroids.  He has a chronic leg wound on his right lower extremity. -Continue to monitor with daily CBC  FEN/GI:renal/carb modified diet Prophylaxis:SCD on left leg  Disposition: SNF pending placement at [facility] following colonoscopy 7/15  Subjective:  Patient seen this morning on the bedpan attempting to do have one additional bowel movement before his potential colonoscopy this morning.  Patient understood the value of his bowel  prep orders and reiterated that it was very difficult for him to complete bowel prep.  Patient stated that he has had hemorrhoids in the past  which have led to bright red blood per rectum in the past and have been ameliorated the treatment of the hemorrhoids.  Patient suspects that this current bleeding is also due to hemorrhoids.    Objective: Temp:  [98.3 F (36.8 C)-98.5 F (36.9 C)] 98.5 F (36.9 C) (07/15 0427) Pulse Rate:  [38-73] 69 (07/15 0427) Resp:  [17] 17 (07/14 2113) BP: (103-118)/(66-98) 103/72 (07/15 0427) SpO2:  [94 %-98 %] 94 % (07/15 0427) Weight:  [96.6 kg (212 lb 15.4 oz)] 96.6 kg (212 lb 15.4 oz) (07/15 0429)    Gen: elderly chronically ill appearing man laying in bed in NAD. Engaged in the interview. Heart: Regular rate, frequent dropped beats, no M/R/G Lungs: CTA bilaterally, no increased work of breathing Abdomen: soft, non-tender, non-distended, +BS Extremities: minimal erythema on bilateral legs with scabbing present,  Neuro: no focal deficits  Laboratory: Recent Labs  Lab 08/05/17 1627 08/06/17 0420 08/07/17 0422  WBC 17.4* 15.2* 14.2*  HGB 8.2* 7.7* 7.8*  HCT 26.7* 25.0* 24.7*  PLT 358 303 285   Recent Labs  Lab 08/05/17 0523 08/06/17 0420 08/07/17 0422  NA 134* 134* 134*  K 3.9 4.2 4.0  CL 99 96* 98  CO2 28 27 26   BUN 19 33* 15  CREATININE 3.90* 5.38* 3.26*  CALCIUM 7.3* 7.8* 7.6*  GLUCOSE 136* 168* 139*    Imaging/Diagnostic Tests: Dg Chest 1 View IMPRESSION: No acute disease. Cardiomegaly. Atherosclerosis. Electronically Signed   By: Inge Rise M.D.   On: 08/05/2017 13:09   Dg Knee 1-2 Views Right IMPRESSION: No focal bone abnormality. Soft tissue swelling. Electronically Signed   By: Kerby Moors M.D.   On: 07/25/2017 17:26   Dg Tibia/fibula Right IMPRESSION: No acute abnormality. Atherosclerosis. Electronically Signed   By: Inge Rise M.D.   On: 07/25/2017 17:28   Ct Extremity Lower Right Wo Contrast 1. No findings of abscess or osteomyelitis. 2. Skin disruption along the posterior calf with some confluent regional subcutaneous edema potentially  reflecting cellulitis. 3. Notable atherosclerotic calcification. 4. Dorsal subcutaneous edema in the foot, nonspecific. Electronically Signed   By: Van Clines M.D.   On: 07/29/2017 17:07   Matilde Haymaker, MD 08/08/2017, 6:45 AM PGY-3, [place] Intern pager: [phone], text pages welcome

## 2017-08-08 NOTE — Anesthesia Preprocedure Evaluation (Signed)
Anesthesia Evaluation  Patient identified by MRN, date of birth, ID band Patient awake    Reviewed: Allergy & Precautions, NPO status , Patient's Chart, lab work & pertinent test results  Airway Mallampati: II  TM Distance: >3 FB Neck ROM: Full    Dental no notable dental hx. (+) Teeth Intact, Dental Advisory Given   Pulmonary neg pulmonary ROS,    Pulmonary exam normal breath sounds clear to auscultation       Cardiovascular hypertension, + CAD, + Past MI and +CHF  Normal cardiovascular exam Rhythm:Regular Rate:Normal     Neuro/Psych CVA negative neurological ROS  negative psych ROS   GI/Hepatic negative GI ROS,   Endo/Other  diabetes  Renal/GU      Musculoskeletal   Abdominal   Peds  Hematology  (+) anemia ,   Anesthesia Other Findings   Reproductive/Obstetrics                            .lastcbc Anesthesia Physical Anesthesia Plan  ASA: III  Anesthesia Plan: MAC   Post-op Pain Management:    Induction: Intravenous  PONV Risk Score and Plan:   Airway Management Planned: Mask, Natural Airway and Nasal Cannula  Additional Equipment:   Intra-op Plan:   Post-operative Plan: Extubation in OR  Informed Consent: I have reviewed the patients History and Physical, chart, labs and discussed the procedure including the risks, benefits and alternatives for the proposed anesthesia with the patient or authorized representative who has indicated his/her understanding and acceptance.   Dental advisory given  Plan Discussed with:   Anesthesia Plan Comments:         Anesthesia Quick Evaluation

## 2017-08-08 NOTE — Transfer of Care (Signed)
Immediate Anesthesia Transfer of Care Note  Patient: Mitchell Garcia  Procedure(s) Performed: ESOPHAGOGASTRODUODENOSCOPY (EGD) WITH PROPOFOL (N/A )  Patient Location: PACU  Anesthesia Type:MAC  Level of Consciousness: awake, alert , oriented and patient cooperative  Airway & Oxygen Therapy: Patient Spontanous Breathing and Patient connected to nasal cannula oxygen  Post-op Assessment: Report given to RN and Post -op Vital signs reviewed and stable  Post vital signs: Reviewed and stable  Last Vitals:  Vitals Value Taken Time  BP    Temp    Pulse    Resp    SpO2      Last Pain:  Vitals:   08/08/17 0919  TempSrc: Oral  PainSc: 7       Patients Stated Pain Goal: 5 (38/17/71 1657)  Complications: No apparent anesthesia complications

## 2017-08-08 NOTE — Op Note (Signed)
[facility] Patient Name: Mitchell Garcia Procedure Date : 08/08/2017 MRN: [id number] Attending MD: Clarene Essex , MD Date of Birth: [birth year] CSN: [id number] Age: 72 Admit Type: Inpatient Procedure:                Upper GI endoscopy Indications:              Iron deficiency anemia secondary to chronic blood                            loss, Heme positive stool Providers:                Clarene Essex, MD, Zenon Mayo, RN, Elspeth Cho                            Tech., Technician Referring MD:              Medicines:                Propofol total dose 100 mg IV,80 mg IV lidocaine Complications:            No immediate complications. Estimated Blood Loss:     Estimated blood loss: none. Procedure:                Pre-Anesthesia Assessment:                           - Prior to the procedure, a History and Physical                            was performed, and patient medications and                            allergies were reviewed. The patient's tolerance of                            previous anesthesia was also reviewed. The risks                            and benefits of the procedure and the sedation                            options and risks were discussed with the patient.                            All questions were answered, and informed consent                            was obtained. Prior Anticoagulants: The patient has                            taken Plavix (clopidogrel), last dose was 5 days                            prior to procedure. ASA Grade Assessment: III - A  patient with severe systemic disease. After                            reviewing the risks and benefits, the patient was                            deemed in satisfactory condition to undergo the                            procedure.                           After obtaining informed consent, the endoscope was                            passed under direct vision.  Throughout the                            procedure, the patient's blood pressure, pulse, and                            oxygen saturations were monitored continuously. The                            EG-[id number] ([id number]) scope was introduced through the                            mouth, and advanced to the third part of duodenum.                            The upper GI endoscopy was accomplished without                            difficulty. The patient tolerated the procedure                            well. Scope In: Scope Out: Findings:      A small hiatal hernia was present.      Few non-bleeding superficial duodenal ulcers were found in the duodenal       bulb, in the first portion of the duodenum and in the second portion of       the duodenum.      The exam was otherwise without abnormality.      The entire examined stomach was normal. I did put 240 mL of GoLYTELY       slowly into his small bowelthroughThe endoscope Impression:               - Small hiatal hernia.                           - Multiple non-bleeding duodenal ulcers.                           - The examination was otherwise normal.                           -  Normal stomach.                           - No specimens collected. Recommendation:           - Patient has a contact number available for                            emergencies. The signs and symptoms of potential                            delayed complications were discussed with the                            patient. Return to normal activities tomorrow.                            Written discharge instructions were provided to the                            patient.                           - Clear liquid diet today.                           - Perform a colonoscopy tomorrow. and if well                            tolerated and no significant findings probably no                            further GI workup and will advance diet tomorrow                            - Continue present medications. Will add                            hemorrhoidal suppositories                           - Return to GI clinic PRN.                           - Telephone GI clinic if symptomatic PRN.                           - Use Protonix (pantoprazole) 40 mg PO daily                            indefinitely. Procedure Code(s):        --- Professional ---                           [phone], Esophagogastroduodenoscopy, flexible,  transoral; diagnostic, including collection of                            specimen(s) by brushing or washing, when performed                            (separate procedure) Diagnosis Code(s):        --- Professional ---                           K44.9, Diaphragmatic hernia without obstruction or                            gangrene                           K31.89, Other diseases of stomach and duodenum                           K26.9, Duodenal ulcer, unspecified as acute or                            chronic, without hemorrhage or perforation                           D50.0, Iron deficiency anemia secondary to blood                            loss (chronic)                           R19.5, Other fecal abnormalities CPT copyright 2017 American Medical Association. All rights reserved. The codes documented in this report are preliminary and upon coder review may  be revised to meet current compliance requirements. Clarene Essex, MD 08/08/2017 11:05:25 AM This report has been signed electronically. Number of Addenda: 0

## 2017-08-08 NOTE — Progress Notes (Signed)
Physical Therapy Treatment Patient Details Name: Mitchell Garcia MRN: [id number] DOB: 07-26-1945 Today's Date: 08/08/2017    History of Present Illness Pt is a 72 y/o male admitted secondary to R LE cellulitis and R questionable flexion contracture. After further work up his ailment is found to be an inflamed and ulcerated area of an angiodermatitis.   PMH including but not limited to ESRD, DM, CHF, CAD s/p NSTEMI, CVA.    PT Comments    Pt performed rolling, repositioning and limited B supine exercises.  Pt limited due to bowel complication post bowel prep for colonoscopy tomorrow. Pt also presents with low BP pre session.  Pt refusing OOB mobility at this time and no amount of education or encouragement is changing his decision.  Will continue to progress patient per POC as he is able to tolerate.     Follow Up Recommendations  SNF     Equipment Recommendations  None recommended by PT    Recommendations for Other Services       Precautions / Restrictions Precautions Precautions: Fall Precaution Comments: R LE pain, limited knee ROM Restrictions Weight Bearing Restrictions: No    Mobility  Bed Mobility   Bed Mobility: Rolling Rolling: Min guard(to R and L for pericare and placement of bed pad.  )         General bed mobility comments: Pt able to roll R and L unassisted and boost into supine for positioning .  Pt required increased time to boost HOB.    Transfers Overall transfer level: (Refused OOB despite constant education and encouragment.  Pt reports I am not going to do because I have to protect myself.  Pt, nurse and MD (kidney) educated he was safe to mobilize.  He remains to refuse.  )                  Ambulation/Gait                 Stairs             Wheelchair Mobility    Modified Rankin (Stroke Patients Only)       Balance                                            Cognition Arousal/Alertness:  Awake/alert Behavior During Therapy: WFL for tasks assessed/performed Overall Cognitive Status: Impaired/Different from baseline Area of Impairment: Memory                     Memory: Decreased short-term memory Following Commands: Follows one step commands consistently;Follows one step commands with increased time Safety/Judgement: Decreased awareness of deficits;Decreased awareness of safety   Problem Solving: Difficulty sequencing;Decreased initiation;Requires verbal cues;Slow processing General Comments: poor insight, he refused OOB because he feels like he needs to protect himself.  His explanation was irrational.        Exercises Total Joint Exercises Ankle Circles/Pumps: AROM;Both;10 reps;Supine Heel Slides: AROM;Both;10 reps;Supine Straight Leg Raises: AROM;Both;10 reps;Supine(extensor lag in B LEs despite cues to correct.  )    General Comments        Pertinent Vitals/Pain Pain Assessment: 0-10 Pain Score: 3  Pain Location: R LE with ROM Pain Descriptors / Indicators: Grimacing;Guarding Pain Intervention(s): Monitored during session;Repositioned    Home Living  Prior Function            PT Goals (current goals can now be found in the care plan section) Acute Rehab PT Goals Patient Stated Goal: decrease pain Potential to Achieve Goals: Good Progress towards PT goals: Progressing toward goals    Frequency    Min 2X/week      PT Plan Current plan remains appropriate;Frequency needs to be updated    Co-evaluation              AM-PAC PT "6 Clicks" Daily Activity  Outcome Measure  Difficulty turning over in bed (including adjusting bedclothes, sheets and blankets)?: A Little Difficulty moving from lying on back to sitting on the side of the bed? : A Little Difficulty sitting down on and standing up from a chair with arms (e.g., wheelchair, bedside commode, etc,.)?: Unable Help needed moving to and from a bed to  chair (including a wheelchair)?: A Lot Help needed walking in hospital room?: A Lot Help needed climbing 3-5 steps with a railing? : Total 6 Click Score: 12    End of Session Equipment Utilized During Treatment: Gait belt Activity Tolerance: Patient limited by pain Patient left: in bed;with call bell/phone within reach Nurse Communication: Mobility status PT Visit Diagnosis: Other abnormalities of gait and mobility (R26.89);Pain Pain - Right/Left: Right Pain - part of body: Leg;Knee     Time: 1353(around 1 actively treating, increased time spent on education and encouragement.))-1429 PT Time Calculation (min) (ACUTE ONLY): 36 min  Charges:  $Therapeutic Exercise: 8-22 mins                    G Codes:       Governor Rooks, PTA pager [phone]    Cristela Blue 08/08/2017, 2:40 PM

## 2017-08-09 ENCOUNTER — Encounter (HOSPITAL_COMMUNITY)
Admission: EM | Disposition: A | Discharge status: Skilled Nursing Facility | Payer: Self-pay | Source: Home / Self Care | Attending: Family Medicine

## 2017-08-09 ENCOUNTER — Inpatient Hospital Stay (HOSPITAL_COMMUNITY): Payer: Non-veteran care | Admitting: Anesthesiology

## 2017-08-09 ENCOUNTER — Encounter (HOSPITAL_COMMUNITY): Payer: Self-pay | Admitting: *Deleted

## 2017-08-09 HISTORY — PX: COLONOSCOPY WITH PROPOFOL: SHX5780

## 2017-08-09 HISTORY — PX: SCHLEROTHERAPY: SHX5440

## 2017-08-09 LAB — CBC WITH DIFFERENTIAL/PLATELET
ABS IMMATURE GRANULOCYTES: 0.1 10*3/uL (ref 0.0–0.1)
Basophils Absolute: 0.1 10*3/uL (ref 0.0–0.1)
Basophils Relative: 0 %
Eosinophils Absolute: 0.2 10*3/uL (ref 0.0–0.7)
Eosinophils Relative: 2 %
HCT: 24.4 % — ABNORMAL LOW (ref 39.0–52.0)
HEMOGLOBIN: 7.4 g/dL — AB (ref 13.0–17.0)
Immature Granulocytes: 1 %
Lymphocytes Relative: 12 %
Lymphs Abs: 1.4 10*3/uL (ref 0.7–4.0)
MCH: 29.1 pg (ref 26.0–34.0)
MCHC: 30.3 g/dL (ref 30.0–36.0)
MCV: 96.1 fL (ref 78.0–100.0)
MONO ABS: 1.2 10*3/uL — AB (ref 0.1–1.0)
MONOS PCT: 11 %
NEUTROS ABS: 8.5 10*3/uL — AB (ref 1.7–7.7)
Neutrophils Relative %: 74 %
PLATELETS: 307 10*3/uL (ref 150–400)
RBC: 2.54 MIL/uL — ABNORMAL LOW (ref 4.22–5.81)
RDW: 15.5 % (ref 11.5–15.5)
WBC: 11.4 10*3/uL — ABNORMAL HIGH (ref 4.0–10.5)

## 2017-08-09 LAB — RENAL FUNCTION PANEL
ANION GAP: 11 (ref 5–15)
Albumin: 2.4 g/dL — ABNORMAL LOW (ref 3.5–5.0)
BUN: 31 mg/dL — ABNORMAL HIGH (ref 8–23)
CALCIUM: 8.1 mg/dL — AB (ref 8.9–10.3)
CO2: 26 mmol/L (ref 22–32)
Chloride: 95 mmol/L — ABNORMAL LOW (ref 98–111)
Creatinine, Ser: 5.13 mg/dL — ABNORMAL HIGH (ref 0.61–1.24)
GFR calc Af Amer: 12 mL/min — ABNORMAL LOW (ref >60)
GFR calc non Af Amer: 10 mL/min — ABNORMAL LOW (ref >60)
GLUCOSE: 109 mg/dL — AB (ref 70–99)
Phosphorus: 5.2 mg/dL — ABNORMAL HIGH (ref 2.5–4.6)
Potassium: 4.6 mmol/L (ref 3.5–5.1)
SODIUM: 132 mmol/L — AB (ref 135–145)

## 2017-08-09 LAB — GLUCOSE, CAPILLARY
GLUCOSE-CAPILLARY: 101 mg/dL — AB (ref 70–99)
GLUCOSE-CAPILLARY: 219 mg/dL — AB (ref 70–99)
Glucose-Capillary: 105 mg/dL — ABNORMAL HIGH (ref 70–99)
Glucose-Capillary: 198 mg/dL — ABNORMAL HIGH (ref 70–99)

## 2017-08-09 SURGERY — COLONOSCOPY WITH PROPOFOL
Anesthesia: Monitor Anesthesia Care

## 2017-08-09 MED ORDER — HEPARIN SODIUM (PORCINE) 1000 UNIT/ML DIALYSIS: 1000.0000 [IU] | INTRAMUSCULAR | Status: DC | PRN

## 2017-08-09 MED ORDER — PROPOFOL 10 MG/ML IV BOLUS
INTRAVENOUS | Status: DC | PRN
  Administered 2017-08-09: 10 mg via INTRAVENOUS
  Administered 2017-08-09: 20 mg via INTRAVENOUS

## 2017-08-09 MED ORDER — ONDANSETRON HCL 4 MG/2ML IJ SOLN: 4.0000 mg | Freq: Once | INTRAMUSCULAR | Status: DC | PRN

## 2017-08-09 MED ORDER — SODIUM CHLORIDE 0.9 % IJ SOLN
PREFILLED_SYRINGE | INTRAMUSCULAR | Status: DC | PRN
  Administered 2017-08-09: 3 mL

## 2017-08-09 MED ORDER — ALTEPLASE 2 MG IJ SOLR
2.0000 mg | Freq: Once | INTRAMUSCULAR | Status: AC | PRN
  Administered 2017-08-10: 2 mg

## 2017-08-09 MED ORDER — LIDOCAINE HCL (PF) 1 % IJ SOLN: 5.0000 mL | INTRAMUSCULAR | Status: DC | PRN

## 2017-08-09 MED ORDER — PENTAFLUOROPROP-TETRAFLUOROETH EX AERO: 1.0000 "application " | INHALATION_SPRAY | CUTANEOUS | Status: DC | PRN

## 2017-08-09 MED ORDER — LIDOCAINE-PRILOCAINE 2.5-2.5 % EX CREA: 1.0000 "application " | TOPICAL_CREAM | CUTANEOUS | Status: DC | PRN

## 2017-08-09 MED ORDER — SODIUM CHLORIDE 0.9 % IV SOLN: 100.0000 mL | INTRAVENOUS | Status: DC | PRN

## 2017-08-09 MED ORDER — PROPOFOL 500 MG/50ML IV EMUL
INTRAVENOUS | Status: DC | PRN
  Administered 2017-08-09: 12:00:00 via INTRAVENOUS
  Administered 2017-08-09: 100 ug/kg/min via INTRAVENOUS

## 2017-08-09 SURGICAL SUPPLY — 22 items

## 2017-08-09 NOTE — Transfer of Care (Signed)
Immediate Anesthesia Transfer of Care Note  Patient: Mitchell Garcia  Procedure(s) Performed: COLONOSCOPY WITH PROPOFOL (N/A ) SCHLEROTHERAPY OF colon avm  Patient Location: Endoscopy Unit  Anesthesia Type:MAC  Level of Consciousness: awake and alert   Airway & Oxygen Therapy: Patient Spontanous Breathing  Post-op Assessment: Report given to RN and Post -op Vital signs reviewed and stable  Post vital signs: Reviewed and stable  Last Vitals:  Vitals Value Taken Time  BP    Temp    Pulse    Resp    SpO2      Last Pain:  Vitals:   08/09/17 1031  TempSrc:   PainSc: 0-No pain      Patients Stated Pain Goal: 5 (49/44/96 7591)  Complications: No apparent anesthesia complications

## 2017-08-09 NOTE — Social Work (Addendum)
CSW continuing to follow for transfer to SNF when medically appropriate.  Pt first choice-[facility] is following for possible admit.  3:24pm- CS[name], she is aware pt recommended for SNF, will be able to support with organizing transportation to dialysis from [place] when pt transferred.  [facility] is still follow for admit when medically ready and pt granddaughter Estill Bamberg also has been informed of all updates regarding disposition.    Alexander Mt, Jacobus Work [phone]

## 2017-08-09 NOTE — Anesthesia Procedure Notes (Signed)
Procedure Name: MAC Date/Time: 08/09/2017 11:19 AM Performed by: Wilburn Cornelia, CRNA Pre-anesthesia Checklist: Patient identified, Emergency Drugs available, Suction available, Patient being monitored and Timeout performed Patient Re-evaluated:Patient Re-evaluated prior to induction Oxygen Delivery Method: Simple face mask Placement Confirmation: positive ETCO2 and breath sounds checked- equal and bilateral Dental Injury: Teeth and Oropharynx as per pre-operative assessment

## 2017-08-09 NOTE — Progress Notes (Signed)
Spiritual Care Consult Acknowledged. Mitchell Garcia was in good spirits at the time of visit. He shared with me that his Mitchell Garcia had just left and that he had enjoyed that visit. Mitchell Garcia acknowledged that he had been down, and that he was not feeling good about his recovery. He shared that he had a significant spiritual experience that turned things for him within. Chaplain offered the [employer] and empathic listening as patient engaged in some life review and talked about his new sense of resolve. I look forward to a follow up visit.

## 2017-08-09 NOTE — Progress Notes (Signed)
[name] 11:15 AM  Subjective: Patient doing well today and is hungry and did say he had a good cleanout last night and has no other complaints and no problems from his endoscopy  Objective: Vital signs stable afebrile no acute distress exam please see preassessment evaluationlabs stable hemoglobin slight drop  Assessment: Guaiac positive anemia in patient with bright red blood per rectum  Plan: Okay to proceed with flexible sigmoidoscopy or colonoscopy today with anesthesia assistance  [facility] E  Pager [phone] After 5PM or if no answer call [phone]

## 2017-08-09 NOTE — Anesthesia Postprocedure Evaluation (Signed)
Anesthesia Post Note  Patient: Mitchell Garcia  Procedure(s) Performed: COLONOSCOPY WITH PROPOFOL (N/A ) SCHLEROTHERAPY OF colon avm     Patient location during evaluation: Endoscopy Anesthesia Type: MAC Level of consciousness: awake and alert and oriented Pain management: pain level controlled Vital Signs Assessment: post-procedure vital signs reviewed and stable Respiratory status: spontaneous breathing, nonlabored ventilation and respiratory function stable Cardiovascular status: blood pressure returned to baseline and stable Postop Assessment: no apparent nausea or vomiting Anesthetic complications: no    Last Vitals:  Vitals:   08/09/17 1200 08/09/17 1215  BP: (!) 90/52 (!) 144/57  Pulse:  (!) 57  Resp: 20 12  Temp: 36.4 C   SpO2: 96% 100%    Last Pain:  Vitals:   08/09/17 1215  TempSrc:   PainSc: 0-No pain                 Rissie Sculley A.

## 2017-08-09 NOTE — Progress Notes (Signed)
[facility] ROUNDING NOTE   Subjective:   No acute events overnight. Mr. Henrene Pastor is doing well this morning. His only complaint is pain in his R knee and R leg at the wound site. Denies fever, chills, chest pain, shortness of breath, nausea and vomiting.   Objective:  Vital signs in last 24 hours:  Temp:  [97.7 F (36.5 C)-99.1 F (37.3 C)] 97.7 F (36.5 C) (07/16 0513) Pulse Rate:  [49-76] 66 (07/16 0513) Resp:  [17-20] 18 (07/16 0513) BP: (87-129)/(44-81) 125/65 (07/16 0513) SpO2:  [93 %-99 %] 94 % (07/16 0513) Weight:  [213 lb 10 oz (96.9 kg)] 213 lb 10 oz (96.9 kg) (07/16 0500)  Weight change: 10.6 oz (0.3 kg) Filed Weights   08/06/17 1710 08/08/17 0429 08/09/17 0500  Weight: 211 lb 3.2 oz (95.8 kg) 212 lb 15.4 oz (96.6 kg) 213 lb 10 oz (96.9 kg)    Intake/Output: I/O last 3 completed shifts: In: 622.1 [P.O.:300; I.V.:322.1] Out: 200 [Urine:200]   Intake/Output this shift:  Total I/O In: -  Out: 75 [Urine:75]  General: chronically ill-appearing male, well-developed, laying in bed in no acute distress  CVS- RRR, unchanged holosystolic murmur  RS- CTAB, no increased WOB  ABD- soft, NTND, normoactive bowel sounds  EXT- RLE wrapped, wound erythema appears improved, very minimal edema bilaterally, R cimino with palpable bruit    Basic Metabolic Panel: Recent Labs  Lab 08/05/17 0523 08/06/17 0420 08/06/17 1110 08/07/17 0422 08/08/17 1146 08/09/17 0533  NA 134* 134*  --  134* 133* 132*  K 3.9 4.2  --  4.0 4.8 4.6  CL 99 96*  --  98 93* 95*  CO2 28 27  --  26 29 26   GLUCOSE 136* 168*  --  139* 142* 109*  BUN 19 33*  --  15 26* 31*  CREATININE 3.90* 5.38*  --  3.26* 4.73* 5.13*  CALCIUM 7.3* 7.8*  --  7.6* 8.1* 8.1*  PHOS  --   --  3.4 3.0 4.3 5.2*    Liver Function Tests: Recent Labs  Lab 08/07/17 0422 08/08/17 1146 08/09/17 0533  ALBUMIN 2.3* 2.6* 2.4*   No results for input(s): LIPASE, AMYLASE in the last 168 hours. No results for  input(s): AMMONIA in the last 168 hours.  CBC: Recent Labs  Lab 08/05/17 1627 08/06/17 0420 08/07/17 0422 08/08/17 1146 08/09/17 0533  WBC 17.4* 15.2* 14.2* 13.7* 11.4*  NEUTROABS  --   --  11.2*  --  8.5*  HGB 8.2* 7.7* 7.8* 8.1* 7.4*  HCT 26.7* 25.0* 24.7* 26.3* 24.4*  MCV 95.4 95.1 95.0 97.8 96.1  PLT 358 303 285 305 307    Cardiac Enzymes: No results for input(s): CKTOTAL, CKMB, CKMBINDEX, TROPONINI in the last 168 hours.  BNP: Invalid input(s): POCBNP  CBG: Recent Labs  Lab 08/08/17 0803 08/08/17 1146 08/08/17 1741 08/08/17 2002 08/09/17 0752  GLUCAP 137* 143* 158* 163* 101*    Microbiology: Results for orders placed or performed during the hospital encounter of 07/25/17  Blood culture (routine x 2)     Status: None   Collection Time: 07/25/17 12:30 PM  Result Value Ref Range Status   Specimen Description BLOOD LEFT ANTECUBITAL  Final   Special Requests   Final    BOTTLES DRAWN AEROBIC ONLY Blood Culture adequate volume   Culture   Final    NO GROWTH 5 DAYS Performed at [facility], [place] [address], [place], [place] [address]    Report  Status 07/30/2017 FINAL  Final  Blood culture (routine x 2)     Status: None   Collection Time: 07/25/17 12:38 PM  Result Value Ref Range Status   Specimen Description BLOOD SITE NOT SPECIFIED  Final   Special Requests   Final    BOTTLES DRAWN AEROBIC ONLY Blood Culture results may not be optimal due to an inadequate volume of blood received in culture bottles   Culture   Final    NO GROWTH 5 DAYS Performed at [facility], [place] [address], [place], [place] [address]    Report Status 07/30/2017 FINAL  Final  Culture, Urine     Status: Abnormal   Collection Time: 07/28/17  7:40 AM  Result Value Ref Range Status   Specimen Description URINE, RANDOM  Final   Special Requests   Final    NONE Performed at [facility], [place] [address] [place], [place] [address]    Culture MULTIPLE SPECIES PRESENT,  SUGGEST RECOLLECTION (A)  Final   Report Status 07/29/2017 FINAL  Final    Coagulation Studies: No results for input(s): LABPROT, INR in the last 72 hours.  Urinalysis: No results for input(s): COLORURINE, LABSPEC, PHURINE, GLUCOSEU, HGBUR, BILIRUBINUR, KETONESUR, PROTEINUR, UROBILINOGEN, NITRITE, LEUKOCYTESUR in the last 72 hours.  Invalid input(s): APPERANCEUR    Imaging: No results found.   Medications:   . sodium chloride Stopped (08/08/17 1111)  . sodium chloride 20 mL/hr at 08/09/17 0136  . ferric gluconate (FERRLECIT/NULECIT) IV     . sodium chloride   Intravenous Once  . acetaminophen  650 mg Oral Q6H  . atorvastatin  80 mg Oral QHS  . Chlorhexidine Gluconate Cloth  6 each Topical Q0600  . cinacalcet  30 mg Oral Q T,Th,Sa-HD  . darbepoetin (ARANESP) injection - DIALYSIS  60 mcg Intravenous Q Tue-HD  . gabapentin  300 mg Oral QHS  . hydrocortisone  25 mg Rectal BID  . insulin aspart  0-15 Units Subcutaneous TID WC  . insulin aspart  0-5 Units Subcutaneous QHS  . isosorbide-hydrALAZINE  1 tablet Oral BID  . multivitamin  1 tablet Oral QHS  . pantoprazole  40 mg Oral Daily  . polyethylene glycol  17 g Oral BID  . primidone  100 mg Oral Daily  . sevelamer carbonate  2,400 mg Oral TID WC  . triamcinolone cream   Topical BID  . vitamin B-12  1,000 mcg Oral Daily   ALPRAZolam, alum & mag hydroxide-simeth, docusate sodium, lidocaine, menthol-cetylpyridinium, nitroGLYCERIN, ondansetron (ZOFRAN) IV, oxyCODONE, traMADol  Assessment/ Plan:  Mr. Dais is a 72 yo M with history of CAD, NSTEMI s/p PCI x2, uncontrolled T2DM, ESRD on HD T/Th/S,combined systolic and diastolic HD who presented on 7/1 with cellulitis and chronic RLE wound found to be angiodermatitis. Nephrology following for HD.   ESRD: Last HD 7/13. Plan for HD today after colonoscopy. Appears euvolemic on exam.    Anemia of CKD: Hgb 8-9 at baseline. Currently at 7.4. On Aranesp and Nulecit.   GI bleed:  EGD yesterday with small hiatal hernia and few non-bleeding duodenal ulcers. Plan for colonoscopy today.   RLE ulcer: Stable. Found to be angiodermatitis on skin biopsy. Off antibiotics.   MBD- On Renvela 2,400 mg TID and Sensipar. Phos 3.4 7/13   HTN: On Bidil 1 tablet BID   CAD and PVD: Holding ASA and Plavix in the setting of GI bleed     LOS: 15 Anahlia Iseminger Santos-Sanchez 08/09/2017 9:56 AM

## 2017-08-09 NOTE — Progress Notes (Signed)
Family Medicine Teaching Service Daily Progress Note Intern Pager: [phone]  Patient name: Mitchell Garcia Medical record number: [id number] Date of birth: 12-Oct-1945 Age: 72 y.o. Gender: male  Primary Care Provider: [name], MD Consultants: nephrology, orthopedics, wound care  Code Status: Full   Pt Overview and Major Events to Date:  7/1 admitted to [facility] for cellulitis and chronic leg wound 7/6 biopsy by general surgery 7/8 ID stop all antibiotics 7/9 biopsy results suggestive of ulcerative angiodermatitis 7/9 patient had bowel movement with bright red blood 7/11 patient transfused with 2 units packed red blood cells 7/15 upper endoscopy 7/16 lower endoscopy  Assessment and Plan: Mitchell Garcia a 72 y.o.malepresenting with cellulitis of leg. PMH is significant for ESRD on HD, T2DM, anemia, combined systolic/diastolic HF, CAD with history of NSTEMI, chronic leg wound.  The admitting complaint of cellulitis is found to be stasis dermatitis, patient remains in hospital for work-up of GI bleed.  Rectal Bleeding: Stable  Status post 2 units packed red blood cells on 7/11. No new rectal bleeding overnight. Hgb this am 7.8(7/15), stable from yesterday. Patient is currently declining bowel prep. Upper endoscopy 7/15 did not several non bleeding duodenal ulcers, otherwise unremarkable. - Continue to hold Plavix, ASA, lovenox - SCD on left leg - with family and GI for definitive plan - follow-up EGD if colonoscopy is negative. - Pt to stay for lower endoscopy (7/16)  Angiodermatitis, ulcer- stable. Biopsy suggestive of ulcerative angiodermatitis and is without hallmarks of calciphylaxis or infection.  Currently being treated with topical steroid cream per dermatology recommendation.  - Follow-up with Derm at [place] topical steroid cream to intact skin of right lower extremity - Continue wound care  Knee pain: Improving Pain and range of motion in right knee  improving.   - continue tramadol and oxycodone 5mg  q8hrs prn for pain - ice packs prn for knee pain  ESRD on HD: Stable  TTS schedule. - Nephrology following for HD - Avoid nephrotoxic agents. - Continue to follow daily BMP - Continue to monitor strict I/O's - continue home renal meds  Poorly controlled T2DM: Improving Chronic. Last A1c 7.3 on 7/8.  This morning blood glucose 127 - Continue mSSIwith evening coverage - Continue to monitor CBG AC/at bedtime  Anemia of chronic disease: Worsening Baseline about 8. This morning Hgb 7.8. On darbepoetin 60 MCG started on 7/4.  See Rectal Bleeding problem. - continue to monitor  Combined HF: Chronic Most recent Echo 07/09/17 showing LVEF 40-45%. - continue to monitor strict I/O's - continue home HF medications  CAD with history of NSTEMI: Chronic S/P Stent x2. Left Heart Cath 9/14 significant for multivessel disease with proximal LAD stenosis of 70%, total occlusion of mid LCx, and 95% of RCA stenosis.  -Currently holding aspirin and Plavix due to rectal bleeding, colonoscopy 7/15 -Continue statin, nitro, Coreg  Hyponatremia- improving. This AM 134. Patient ESRD on HD. - continue to monitor with daily BMP  Leukocytosis: Persistent Likely demargination, neutrophils 15.4 on differential 7/1.  Doubt infection as patient has been adequately treated by antibiotics for cellulitis, doubt malignancy as no constitutional symptoms, patient is not currently on oral steroids.  He has a chronic leg wound on his right lower extremity. -Continue to monitor with daily CBC  FEN/GI:renal/carb modified diet Prophylaxis:SCD on left leg  Disposition: DC to SNF on 7/17  Subjective:  Patient feels well this morning.  No new symptoms or complaints.  Patient reports that his procedure yesterday went well and  he would like to be done with his procedure today so that he can eat.  Patient continues to note that he believes his rectal bleeding is a  result of hemorrhoids which have been a problem in the past.  Objective: Temp:  [97.4 F (36.3 C)-98.5 F (36.9 C)] 97.4 F (36.3 C) (07/16 1252) Pulse Rate:  [49-77] 64 (07/16 1252) Resp:  [12-20] 16 (07/16 1252) BP: (87-144)/(44-117) 122/70 (07/16 1252) SpO2:  [93 %-100 %] 96 % (07/16 1252) Weight:  [213 lb 10 oz (96.9 kg)] 213 lb 10 oz (96.9 kg) (07/16 0500)    Gen: elderly chronically ill appearing man laying in bed in NAD. Engaged in the interview. Heart: Regular rate, frequent dropped beats, no M/R/G Lungs: CTA bilaterally, no increased work of breathing Abdomen: soft, non-tender, non-distended, +BS Extremities: minimal erythema on bilateral legs with scabbing present, overall left leg appears to be slowly improving during the course of his hospitalization. Neuro: no focal deficits  Laboratory: Recent Labs  Lab 08/07/17 0422 08/08/17 1146 08/09/17 0533  WBC 14.2* 13.7* 11.4*  HGB 7.8* 8.1* 7.4*  HCT 24.7* 26.3* 24.4*  PLT 285 305 307   Recent Labs  Lab 08/07/17 0422 08/08/17 1146 08/09/17 0533  NA 134* 133* 132*  K 4.0 4.8 4.6  CL 98 93* 95*  CO2 26 29 26   BUN 15 26* 31*  CREATININE 3.26* 4.73* 5.13*  CALCIUM 7.6* 8.1* 8.1*  GLUCOSE 139* 142* 109*    Imaging/Diagnostic Tests: Dg Chest 1 View IMPRESSION: No acute disease. Cardiomegaly. Atherosclerosis. Electronically Signed   By: Inge Rise M.D.   On: 08/05/2017 13:09   Dg Knee 1-2 Views Right IMPRESSION: No focal bone abnormality. Soft tissue swelling. Electronically Signed   By: Kerby Moors M.D.   On: 07/25/2017 17:26   Dg Tibia/fibula Right IMPRESSION: No acute abnormality. Atherosclerosis. Electronically Signed   By: Inge Rise M.D.   On: 07/25/2017 17:28   Ct Extremity Lower Right Wo Contrast 1. No findings of abscess or osteomyelitis. 2. Skin disruption along the posterior calf with some confluent regional subcutaneous edema potentially reflecting cellulitis. 3. Notable  atherosclerotic calcification. 4. Dorsal subcutaneous edema in the foot, nonspecific. Electronically Signed   By: Van Clines M.D.   On: 07/29/2017 17:07   Matilde Haymaker, MD 08/09/2017, 1:16 PM PGY-1, [place] Intern pager: [phone], text pages welcome

## 2017-08-09 NOTE — Op Note (Signed)
[facility] Patient Name: Mitchell Garcia Procedure Date : 08/09/2017 MRN: [id number] Attending MD: Clarene Essex , MD Date of Birth: 02-07-1945 CSN: [id number] Age: 72 Admit Type: Inpatient Procedure:                Colonoscopy Indications:              Screening for colorectal malignant neoplasm, This                            is the patient's first colonoscopy, Incidental -                            Hematochezia, Incidental - Iron deficiency anemia                            secondary to chronic blood loss Providers:                Clarene Essex, MD, Presley Raddle, RN, Elspeth Cho                            Tech., Technician, Merrilyn Puma, CRNA Referring MD:              Medicines:                Propofol total dose 124 mg IV Complications:            No immediate complications. Estimated Blood Loss:     Estimated blood loss: none. Procedure:                Pre-Anesthesia Assessment:                           - Prior to the procedure, a History and Physical                            was performed, and patient medications and                            allergies were reviewed. The patient's tolerance of                            previous anesthesia was also reviewed. The risks                            and benefits of the procedure and the sedation                            options and risks were discussed with the patient.                            All questions were answered, and informed consent                            was obtained. Prior Anticoagulants: The patient has  taken Plavix (clopidogrel), last dose was 6 days                            prior to procedure. ASA Grade Assessment: III - A                            patient with severe systemic disease. After                            reviewing the risks and benefits, the patient was                            deemed in satisfactory condition to undergo the                             procedure.                           After obtaining informed consent, the colonoscope                            was passed under direct vision. Throughout the                            procedure, the patient's blood pressure, pulse, and                            oxygen saturations were monitored continuously. The                            EC-[id number] ([id number]) scope was introduced through                            the anus and advanced to the the cecum, identified                            by appendiceal orifice and ileocecal valve. The                            ileocecal valve, appendiceal orifice, and rectum                            were photographed. The colonoscopy was somewhat                            difficult due to inadequate bowel prep. Successful                            completion of the procedure was aided by applying                            abdominal pressure. The patient tolerated the  procedure well. The quality of the bowel                            preparation was poor. Scope In: 11:26:01 AM Scope Out: 11:54:45 AM Scope Withdrawal Time: 0 hours 19 minutes 5 seconds  Total Procedure Duration: 0 hours 28 minutes 44 seconds  Findings:      External and internal hemorrhoids were found during retroflexion, during       perianal exam and during digital exam. The hemorrhoids were small.      Scattered small-mouthed diverticula were found in the sigmoid colon,       descending colon, transverse colon and ascending colon.      Four semi-sessile polyps were found in the proximal transverse colon and       ascending colon. The polyps were small in size.      A small polypoid lesion was found at the hepatic flexure.It may have       been just a raised AVM The lesion was semi-sessile. Oozing was present.       For hemostasis, two hemostatic clips were successfully placed (MR       conditional). There was no bleeding at the end of the  maneuver. Area was       successfully injected with 3 mL of a 1:10,000 solution of epinephrine       for hemostasis.      A moderate amount of semi-liquid stool was found in the entire colon,       making visualization difficult. Lavage of the area was performed using a       moderate amount of sterile water, resulting in incomplete clearance with       continued poor visualization.      The exam was otherwise without abnormality. Impression:               - Preparation of the colon was poor.                           - External and internal hemorrhoids.                           - Diverticulosis in the sigmoid colon, in the                            descending colon, in the transverse colon and in                            the ascending colon.                           - Four small polyps in the proximal transverse                            colon and in the ascending colon. elected not to                            remove based on overall medical condition and poor  prep                           - Polypoid lesion at the hepatic                            flexurequestionable race to AVM. Clips (MR                            conditional) were placed. Injected.                           - Stool in the entire examined colon.                           - The examination was otherwise normal.                           - No specimens collected. Recommendation:           - Patient has a contact number available for                            emergencies. The signs and symptoms of potential                            delayed complications were discussed with the                            patient. Return to normal activities tomorrow.                            Written discharge instructions were provided to the                            patient.                           - Soft diet today.                           - Continue present medications. care with adding                             blood thinners back over next few days and try to                            use the bare minimum needed to prevent cardiac                            vascular or neurologic issues                           - Repeat colonoscopy in 1 year for screening  purposes if doing well medically and able to                            tolerate prep.                           - Return to GI office PRN.                           - Telephone GI clinic if symptomatic PRN. Procedure Code(s):        --- Professional ---                           [phone], Colonoscopy, flexible; with control of                            bleeding, any method Diagnosis Code(s):        --- Professional ---                           Z12.11, Encounter for screening for malignant                            neoplasm of colon                           D12.3, Benign neoplasm of transverse colon (hepatic                            flexure or splenic flexure)                           D12.2, Benign neoplasm of ascending colon                           D49.0, Neoplasm of unspecified behavior of                            digestive system                           K57.30, Diverticulosis of large intestine without                            perforation or abscess without bleeding CPT copyright 2017 American Medical Association. All rights reserved. The codes documented in this report are preliminary and upon coder review may  be revised to meet current compliance requirements. Clarene Essex, MD 08/09/2017 12:11:43 PM This report has been signed electronically. Number of Addenda: 0

## 2017-08-10 LAB — CBC
HEMATOCRIT: 24.7 % — AB (ref 39.0–52.0)
Hemoglobin: 7.5 g/dL — ABNORMAL LOW (ref 13.0–17.0)
MCH: 29.3 pg (ref 26.0–34.0)
MCHC: 30.4 g/dL (ref 30.0–36.0)
MCV: 96.5 fL (ref 78.0–100.0)
PLATELETS: 354 10*3/uL (ref 150–400)
RBC: 2.56 MIL/uL — ABNORMAL LOW (ref 4.22–5.81)
RDW: 15.4 % (ref 11.5–15.5)
WBC: 12.4 10*3/uL — AB (ref 4.0–10.5)

## 2017-08-10 LAB — RENAL FUNCTION PANEL
Albumin: 2.4 g/dL — ABNORMAL LOW (ref 3.5–5.0)
Anion gap: 11 (ref 5–15)
BUN: 41 mg/dL — AB (ref 8–23)
CHLORIDE: 93 mmol/L — AB (ref 98–111)
CO2: 28 mmol/L (ref 22–32)
Calcium: 8.1 mg/dL — ABNORMAL LOW (ref 8.9–10.3)
Creatinine, Ser: 5.77 mg/dL — ABNORMAL HIGH (ref 0.61–1.24)
GFR calc Af Amer: 10 mL/min — ABNORMAL LOW (ref >60)
GFR, EST NON AFRICAN AMERICAN: 9 mL/min — AB (ref >60)
GLUCOSE: 193 mg/dL — AB (ref 70–99)
POTASSIUM: 4.6 mmol/L (ref 3.5–5.1)
Phosphorus: 6.1 mg/dL — ABNORMAL HIGH (ref 2.5–4.6)
Sodium: 132 mmol/L — ABNORMAL LOW (ref 135–145)

## 2017-08-10 LAB — GLUCOSE, CAPILLARY
GLUCOSE-CAPILLARY: 224 mg/dL — AB (ref 70–99)
GLUCOSE-CAPILLARY: 137 mg/dL — AB (ref 70–99)
Glucose-Capillary: 190 mg/dL — ABNORMAL HIGH (ref 70–99)
Glucose-Capillary: 204 mg/dL — ABNORMAL HIGH (ref 70–99)

## 2017-08-10 MED ORDER — NA FERRIC GLUC CPLX IN SUCROSE 12.5 MG/ML IV SOLN
125.0000 mg | INTRAVENOUS | Status: DC
  Administered 2017-08-10: 125 mg via INTRAVENOUS
  Filled 2017-08-10: qty 10

## 2017-08-10 MED ORDER — DARBEPOETIN ALFA 60 MCG/0.3ML IJ SOSY: 60.0000 ug | PREFILLED_SYRINGE | INTRAMUSCULAR | Status: DC

## 2017-08-10 MED ORDER — OXYCODONE HCL 5 MG PO TABS: 5.0000 mg | ORAL_TABLET | Freq: Three times a day (TID) | ORAL | 0 refills | Status: DC | PRN

## 2017-08-10 MED ORDER — OXYCODONE HCL 5 MG PO TABS
5.0000 mg | ORAL_TABLET | ORAL | 0 refills | Status: DC | PRN
Start: 2017-08-10 — End: 2017-12-14

## 2017-08-10 MED ORDER — TRIAMCINOLONE ACETONIDE 0.1 % EX CREA: TOPICAL_CREAM | Freq: Two times a day (BID) | CUTANEOUS | 0 refills | Status: DC

## 2017-08-10 MED ORDER — ALTEPLASE 2 MG IJ SOLR
INTRAMUSCULAR | Status: AC
  Filled 2017-08-10: qty 2

## 2017-08-10 MED ORDER — DARBEPOETIN ALFA 60 MCG/0.3ML IJ SOSY
60.0000 ug | PREFILLED_SYRINGE | INTRAMUSCULAR | Status: AC
  Administered 2017-08-10: 60 ug via INTRAVENOUS
  Filled 2017-08-10: qty 0.3

## 2017-08-10 MED ORDER — DARBEPOETIN ALFA 60 MCG/0.3ML IJ SOSY
PREFILLED_SYRINGE | INTRAMUSCULAR | Status: AC
  Filled 2017-08-10: qty 0.3

## 2017-08-10 NOTE — Clinical Social Work Placement (Signed)
   CLINICAL SOCIAL WORK PLACEMENT  NOTE [facility]   Date:  08/10/2017  Patient Details  Name: Mitchell Garcia MRN: [id number] Date of Birth: 02-10-1945  Clinical Social Work is seeking post-discharge placement for this patient at the [place] level of care (*CSW will initial, date and re-position this form in  chart as items are completed):  Yes   Patient/family provided with [place] Work Department's list of facilities offering this level of care within the geographic area requested by the patient (or if unable, by the patient's family).  Yes   Patient/family informed of their freedom to choose among providers that offer the needed level of care, that participate in Medicare, Medicaid or managed care program needed by the patient, have an available bed and are willing to accept the patient.  Yes   Patient/family informed of 's ownership interest in [facility] and [facility], as well as of the fact that they are under no obligation to receive care at these facilities.  PASRR submitted to EDS on       PASRR number received on       Existing PASRR number confirmed on 07/27/17     FL2 transmitted to all facilities in geographic area requested by pt/family on 07/27/17     FL2 transmitted to all facilities within larger geographic area on       Patient informed that his/her managed care company has contracts with or will negotiate with certain facilities, including the following:        Yes   Patient/family informed of bed offers received.  Patient chooses bed at [facility] [place] recommends and patient chooses bed at      Patient to be transferred to [facility] on 08/10/17.  Patient to be transferred to facility by [facility] transport     Patient family notified on 08/10/17 of transfer.  Name of family member notified:  granddaughter Estill Bamberg and wife     PHYSICIAN Please prepare prescriptions       Additional Comment:    _______________________________________________ Alexander Mt, [place] 08/10/2017, 3:34 PM

## 2017-08-10 NOTE — Progress Notes (Signed)
Report called to [facility]. Will continue to monitor patient.

## 2017-08-10 NOTE — Social Work (Signed)
Clinical Social Worker facilitated patient discharge including contacting patient family and facility to confirm patient discharge plans.  Clinical information faxed to facility and family agreeable with plan.  CSW arranged ambulance transport via [place] wheelchair Lucianne Lei to Phil Campbell RN to call [phone] with report  prior to discharge.  Clinical Social Worker will sign off for now as social work intervention is no longer needed. Please consult [place] again if new need arises.  Alexander Mt, [place] Social Worker [phone]

## 2017-08-10 NOTE — Progress Notes (Signed)
OT Cancellation Note  Patient Details Name: Mitchell Garcia MRN: [id number] DOB: 1945/11/11   Cancelled Treatment:    Reason Eval/Treat Not Completed: Patient at procedure or test/ unavailable(HD)  Malka So 08/10/2017, 12:03 PM  08/10/2017 Nestor Lewandowsky, OTR/L Pager: [phone]

## 2017-08-10 NOTE — Progress Notes (Signed)
Mitchell Garcia 1:30 PM  Subjective: Patient doing fine and dialysis without further bleeding or any complaints and enjoyed his meal yesterdayand no obvious post colonoscopy problems  Objective: Vital signs stable afebrile no acute distress abdomen is soft nontenderhemoglobin stable  Assessment: GI blood loss questionably due to ulcers or AVMs  Plan: Happy to see back when necessary if doing well medically would repeat colonoscopy in 1 year for polypectomy otherwise please let me know if I could help any further with this hospital stay  [facility]  Pager [phone] After 5PM or if no answer call [phone]

## 2017-08-10 NOTE — Discharge Summary (Signed)
Mitchell Garcia Discharge Summary  Patient name: Mitchell Garcia Medical record number: [id number] Date of birth: [birth year] Age: 72 y.o. Gender: male Date of Admission: 07/25/2017  Date of Discharge: 08/10/2017 Admitting Physician: Mitchell Covert, MD  Primary Care Provider: [name], MD Consultants: Neurology, Ortho, wound care, GI  Indication for Hospitalization: Cellulitis of Mitchell right leg with nonhealing ulcer  Discharge Diagnoses/Problem List:  Patient Active Problem List   Diagnosis Date Noted  . BRBPR (bright red blood per rectum)   . Dermatitis   . Skin lesions   . Acute pain of right knee   . Cellulitis 07/25/2017  . Syncope 07/07/2017  . Pressure injury of skin 07/07/2017  . ESRD on dialysis (Mitchell Garcia) 05/24/2017  . Acute blood loss anemia 05/18/2017  . Acute on chronic combined systolic and diastolic CHF (congestive heart failure) ([place]) 05/17/2017  . MI (myocardial infarction) ([place])   . Ischemic cardiomyopathy 10/15/2016  . CAD S/P percutaneous coronary angioplasty 10/15/2016  . Acute kidney injury superimposed on chronic kidney disease ([place]) 10/15/2016  . Uncontrolled diabetes mellitus ([place]) 10/15/2016  . Essential hypertension 10/15/2016  . Acute combined systolic (congestive) and diastolic (congestive) heart failure ([place])   . NSTEMI (non-ST elevated myocardial infarction) ([place]) 10/04/2016  . Acute on chronic diastolic heart failure ([place]) 01/27/2013  . Humerus head fracture 01/27/2013  . Altered mental state 01/25/2013  . Acute renal failure superimposed on chronic kidney disease ([place]) 01/25/2013  . Transaminitis 01/25/2013  . Leukocytosis 01/25/2013  . Anemia of chronic disease 01/25/2013  . Hyponatremia 01/25/2013  . Hyperglycemia 01/25/2013    Disposition: DC to SNF  Discharge Condition: Stable  Discharge Exam:  Gen: elderly chronically ill appearing man laying in bed in NAD. Engaged in Mitchell interview. Heart: Regular  rate, frequent dropped beats, no M/R/G Lungs: Crackles in middle and lower fields bilaterally, normal respiratory effort Abdomen: soft, non-tender, non-distended, +BS Extremities: minimal erythema on bilateral legs with scabbing present, overall left leg appears to be slowly improving during Mitchell course of his hospitalization. Was able to lift left leg to day to demonstrate improvement. Neuro: no focal   Brief Garcia Course:  7/1 admitted to Mitchell Garcia for cellulitis and chronic leg wound 7/6 biopsy by general surgery 7/8 ID stop all antibiotics 7/9 biopsy results suggestive of ulcerative angiodermatitis 7/9 patient had bowel movement with bright red blood 7/11 patient transfused with 2 units packed red blood cells 7/15 upper endoscopy 7/16 lower endoscopy  72 year old male with a medical history significant for ESRD on HD TThS, type 2 diabetes, anemia, combined systolic/diastolic HF, CAD with a history of an STEMI and  chronic leg wound who was admitted to Mitchell Garcia on 7/1 for a chronic leg wound suspicious for calciphylaxis with superimposed cellulitis.  From 7/1 to 7/5 patient was treated with vancomycin and from 7/5 to 7/7 patient was treated with p.o. clindamycin and Ancef in anticipation of discharge, but Mitchell antibiotics did not appear to be making a significant improvement to his cellulitis.  On 7/6 a biopsy of Mitchell leg wound was done by general surgery.  On 7/8 we stopped all antibiotics on Mitchell recommendation of ID as we became suspicious that this was not due to an infection.  Patient did not worsen off of antibiotics.  On 7/9 biopsy results returned and were suggestive of ulcerative angiodermatitis, there was no evidence of calciphylaxis or active infection.  Effective treatment for his leg wound was stopped and we began treating his angiodermatitis with topical  steroids and leg wraps.  On 7/9 patient had a bowel movement with bright red blood per rectum and patient was kept in Mitchell Garcia  due to concern for GI bleed.  On 7/11 he received transfusion of 2 units packed red blood cells when his hemoglobin dropped to 6.6.  15 he underwent an upper endoscopy which revealed several duodenal ulcers which were not actively bleeding.  Due to poor bowel prep he needed to wait until Mitchell following day for Mitchell colonoscopy.  On 7/16 Mitchell colonoscopy showed internal and external hemorrhoids, small mouth diverticula, several polyps and an oozing AVM at Mitchell hepatic flexure.  Mitchell AVM was clipped no further intervention was done.  Mitchell Garcia completed Mitchell above procedures without complication and was stable condition at Mitchell time of discharge.  Of note Mitchell Garcia has a chronic right leg impairment secondary to [name], not recent.  During his time here in Mitchell Garcia he frequently mentioned difficulty moving flexing his right knee.  Knee x-ray and MRI did not show any evidence of osteomyelitis but they did reveal a small right knee efusion.  An orthopedic consult did not see any need for intervention at this time.  Patient was compliant with PT throughout his Garcia stay and did experience small improvements in right knee function.   Issues for Follow Up:  1. Following colonoscopy, GI recommendation was to restart anticoagulation slowly.  ASA was restarted on 7/17 with plans to restart Plavix on 7/24. 2. While inpatient, Mitchell Garcia did not receive his carvedilol and was stable during his time in Garcia.  Carvedilol was not restarted when he was discharged.  Please assess whether Mitchell Garcia should restart this medication. 3. Follow up with dermatology at Mitchell Garcia for ulcerative angiodermatitis, on topical steroid cream to intact skin. 4. Follow-up with Ortho for knee pain 5. Follow-up with GI is not necessary unless new symptoms arise or if there is evidence that GI bleeding has not resolved from clipping of AVM.  GI recommends f/u colonoscopy in 1 year for polypectomy. 6. Continue HD TThS.  Of note, pt did  restart darbepoetin and iron supplementation by nephrology while inpatient.   Significant Procedures:  Biopsy of leg wound Status post 2 units packed red blood cells upper endoscopy colonoscopy  Significant Labs and Imaging:  Recent Labs  Lab 08/08/17 1146 08/09/17 0533 08/10/17 0539  WBC 13.7* 11.4* 12.4*  HGB 8.1* 7.4* 7.5*  HCT 26.3* 24.4* 24.7*  PLT 305 307 354   Recent Labs  Lab 08/06/17 0420 08/06/17 1110 08/07/17 0422 08/08/17 1146 08/09/17 0533 08/10/17 0535  NA 134*  --  134* 133* 132* 132*  K 4.2  --  4.0 4.8 4.6 4.6  CL 96*  --  98 93* 95* 93*  CO2 27  --  26 29 26 28   GLUCOSE 168*  --  139* 142* 109* 193*  BUN 33*  --  15 26* 31* 41*  CREATININE 5.38*  --  3.26* 4.73* 5.13* 5.77*  CALCIUM 7.8*  --  7.6* 8.1* 8.1* 8.1*  PHOS  --  3.4 3.0 4.3 5.2* 6.1*  ALBUMIN  --   --  2.3* 2.6* 2.4* 2.4*      Results/Tests Pending at Time of Discharge:  Results for orders placed or performed during Mitchell Garcia encounter of 07/25/17  Blood culture (routine x 2)     Status: None   Collection Time: 07/25/17 12:30 PM  Result Value Ref Range Status   Specimen Description BLOOD  LEFT ANTECUBITAL  Final   Special Requests   Final    BOTTLES DRAWN AEROBIC ONLY Blood Culture adequate volume   Culture   Final    NO GROWTH 5 DAYS Performed at [facility], [address], [place], [place] [address]    Report Status 07/30/2017 FINAL  Final  Blood culture (routine x 2)     Status: None   Collection Time: 07/25/17 12:38 PM  Result Value Ref Range Status   Specimen Description BLOOD SITE NOT SPECIFIED  Final   Special Requests   Final    BOTTLES DRAWN AEROBIC ONLY Blood Culture results may not be optimal due to an inadequate volume of blood received in culture bottles   Culture   Final    NO GROWTH 5 DAYS Performed at [facility], [place] [address] [place], [place] [address]    Report Status 07/30/2017 FINAL  Final  Culture, Urine     Status: Abnormal    Collection Time: 07/28/17  7:40 AM  Result Value Ref Range Status   Specimen Description URINE, RANDOM  Final   Special Requests   Final    NONE Performed at [facility], [place] [address] [place], [place] [address]    Culture MULTIPLE SPECIES PRESENT, SUGGEST RECOLLECTION (A)  Final   Report Status 07/29/2017 FINAL  Final     Discharge Medications:  Allergies as of 08/10/2017      Reactions   Oxycodone Other (See Comments)   DIFFICULT TO WAKE UP   Tramadol Nausea And Vomiting   Lisinopril Other (See Comments)   Hyperkalemia   Losartan Other (See Comments)   Hyperkalemia      Medication List    STOP taking these medications   calcitRIOL 0.5 MCG capsule Commonly known as:  ROCALTROL   carvedilol 3.125 MG tablet Commonly known as:  COREG   cholecalciferol 1000 units tablet Commonly known as:  VITAMIN D   clopidogrel 75 MG tablet Commonly known as:  PLAVIX   traMADol 50 MG tablet Commonly known as:  ULTRAM     TAKE these medications   acetaminophen 325 MG tablet Commonly known as:  TYLENOL Take 2 tablets (650 mg total) by mouth every 4 (four) hours as needed for mild pain, fever or headache.   ALPRAZolam 0.5 MG tablet Commonly known as:  XANAX Take 1 tablet (0.5 mg total) by mouth at bedtime as needed for anxiety.   aspirin EC 81 MG tablet Take 81 mg by mouth daily.   atorvastatin 80 MG tablet Commonly known as:  LIPITOR Take 80 mg by mouth at bedtime.   DSS 100 MG Caps Take 100 mg by mouth 2 (two) times daily.   gabapentin 300 MG capsule Commonly known as:  NEURONTIN Take 1 capsule (300 mg total) by mouth at bedtime.   insulin aspart protamine- aspart (70-30) 100 UNIT/ML injection Commonly known as:  NOVOLOG MIX 70/30 Inject 30-40 Units into Mitchell skin 2 (two) times daily with a meal.   isosorbide-hydrALAZINE 20-37.5 MG tablet Commonly known as:  BIDIL Take 1 tablet by mouth 2 (two) times daily.   multivitamin Tabs tablet Take 1 tablet by  mouth at bedtime.   nitroGLYCERIN 0.4 MG SL tablet Commonly known as:  NITROSTAT Place 1 tablet (0.4 mg total) under Mitchell tongue every 5 (five) minutes x 3 doses as needed for chest pain.   oxyCODONE 5 MG immediate release tablet Commonly known as:  Oxy IR/ROXICODONE Take 1  tablet (5 mg total) by mouth every 8 (eight) hours as needed for breakthrough pain.   primidone 50 MG tablet Commonly known as:  MYSOLINE Take 100 mg by mouth daily.   sevelamer carbonate 800 MG tablet Commonly known as:  RENVELA Take 2 tablets (1,600 mg total) by mouth 3 (three) times daily with meals.   triamcinolone cream 0.1 % Commonly known as:  KENALOG Apply topically 2 (two) times daily.   vitamin B-12 500 MCG tablet Commonly known as:  CYANOCOBALAMIN Take 1,000 mcg by mouth daily.       Discharge Instructions: Please refer to Patient Instructions section of EMR for full details.  Patient was counseled important signs and symptoms that should prompt return to medical care, changes in medications, dietary instructions, activity restrictions, and follow up appointments.   Follow-Up Appointments:  Contact information for follow-up providers    Dermatology, [place]. Schedule an appointment as soon as possible for a visit.   Why:  call office for Garcia follow up appointment Contact information: [place] [address] [phone]            Contact information for after-discharge care    Destination    [facility] .   Service:  Skilled Nursing Contact information: [address] [place] [phone]                  Matilde Haymaker, MD 08/10/2017, 3:03 PM PGY-1, [place]

## 2017-08-10 NOTE — Progress Notes (Signed)
Pt started HD treatment at 8:40.Pt catheter is not functioning well. Dr Florene Glen notified and order received as follower: Alteplase 2mg  with appropriate volume of NS equal priming of catheter fill ports,dwell 23mins.       HD treatment restarted at 10:00 and improvement in flows.

## 2017-08-10 NOTE — Procedures (Signed)
Tol HD with no hemodynamic instability. Hgb 7.5, K 4.6 Erling Cruz, MD

## 2017-08-10 NOTE — Progress Notes (Signed)
Family Medicine Teaching Service Daily Progress Note Intern Pager: [phone]  Patient name: Mitchell Garcia Medical record number: [id number] Date of birth: 15-Aug-1945 Age: 72 y.o. Gender: male  Primary Care Provider: [name], MD Consultants: nephrology, orthopedics, wound care  Code Status: Full   Pt Overview and Major Events to Date:  7/1 admitted to [facility] for cellulitis and chronic leg wound 7/6 biopsy by general surgery 7/8 ID stop all antibiotics 7/9 biopsy results suggestive of ulcerative angiodermatitis 7/9 patient had bowel movement with bright red blood 7/11 patient transfused with 2 units packed red blood cells 7/15 upper endoscopy 7/16 lower endoscopy  Assessment and Plan: Mitchell Mesa Parrishis a 72 y.o.malepresenting with cellulitis of leg. PMH is significant for ESRD on HD, T2DM, anemia, combined systolic/diastolic HF, CAD with history of NSTEMI, chronic leg wound.  The admitting complaint of cellulitis is found to be stasis dermatitis, patient remains in hospital for work-up of GI bleed.  Rectal Bleeding: Stable  Status post 2 units packed red blood cells on 7/11. No new rectal bleeding overnight. Hgb this am 7.5(7/17), stable from yesterday. Patient is currently declining bowel prep. Upper endoscopy 7/15 did not several non bleeding duodenal ulcers, otherwise unremarkable. - Continue to hold Plavix, ASA, lovenox - SCD on left leg - with family and GI for definitive plan - follow-up EGD if colonoscopy is negative. - colonoscopy (7/16) interne + external hemorrhoids, scattered small-mouthed diverticula, sessile polyps, oozing AVM at hepatic flexure - clips placed.  - start ASA today (7/17) and Plavix on 7/24   Angiodermatitis, ulcer- stable. Biopsy suggestive of ulcerative angiodermatitis and is without hallmarks of calciphylaxis or infection.  Currently being treated with topical steroid cream per dermatology recommendation.  - Follow-up with Derm at [facility] topical steroid cream to intact skin of right lower extremity - Continue wound care  Knee pain: Improving Pain and range of motion in right knee improving.   - continue tramadol and oxycodone 5mg  q8hrs prn for pain - ice packs prn for knee pain  ESRD on HD: Stable  TTS schedule. - Nephrology following for HD - Avoid nephrotoxic agents. - Continue to follow daily BMP - Continue to monitor strict I/O's - continue home renal meds  Poorly controlled T2DM: Improving Chronic. Last A1c 7.3 on 7/8.  This morning blood glucose 127 - Continue mSSIwith evening coverage - Continue to monitor CBG AC/at bedtime  Anemia of chronic disease: Worsening Baseline about 8. This morning Hgb 7.8. On darbepoetin 60 MCG started on 7/4.  See Rectal Bleeding problem. - continue to monitor  Combined HF: Chronic Most recent Echo 07/09/17 showing LVEF 40-45%. - continue to monitor strict I/O's - continue home HF medications  CAD with history of NSTEMI: Chronic S/P Stent x2. Left Heart Cath 9/14 significant for multivessel disease with proximal LAD stenosis of 70%, total occlusion of mid LCx, and 95% of RCA stenosis.  -Currently holding aspirin and Plavix due to rectal bleeding, colonoscopy 7/15 -Continue statin, nitro, Coreg  Hyponatremia- improving. This AM 134. Patient ESRD on HD. - continue to monitor with daily BMP  Leukocytosis: Persistent Likely demargination, neutrophils 15.4 on differential 7/1.  Doubt infection as patient has been adequately treated by antibiotics for cellulitis, doubt malignancy as no constitutional symptoms, patient is not currently on oral steroids.  He has a chronic leg wound on his right lower extremity. -Continue to monitor with daily CBC  FEN/GI:renal/carb modified diet Prophylaxis:SCD on left leg  Disposition: DC to SNF on 7/17  Subjective:  Mr. Mitchell Garcia is well-appearing this morning and had no new complaints.  We reviewed the results of his  colonoscopy which she understood and discussed plan to go to an SNF today following hemodialysis.  Mr. Mitchell Garcia acknowledged understanding.  Objective: Temp:  [97.4 F (36.3 C)-98.5 F (36.9 C)] 98.4 F (36.9 C) (07/17 0500) Pulse Rate:  [57-77] 72 (07/17 0500) Resp:  [12-20] 17 (07/17 0500) BP: (90-144)/(52-117) 127/59 (07/17 0500) SpO2:  [94 %-100 %] 94 % (07/17 0500)    Gen: elderly chronically ill appearing man laying in bed in NAD. Engaged in the interview. Heart: Regular rate, frequent dropped beats, no M/R/G Lungs: CTA bilaterally, no increased work of breathing Abdomen: soft, non-tender, non-distended, +BS Extremities: minimal erythema on bilateral legs with scabbing present, overall left leg appears to be slowly improving during the course of his hospitalization. Was able to lift left leg to day to demonstrate improvement. Neuro: no focal deficits  Laboratory: Recent Labs  Lab 08/07/17 0422 08/08/17 1146 08/09/17 0533  WBC 14.2* 13.7* 11.4*  HGB 7.8* 8.1* 7.4*  HCT 24.7* 26.3* 24.4*  PLT 285 305 307   Recent Labs  Lab 08/07/17 0422 08/08/17 1146 08/09/17 0533  NA 134* 133* 132*  K 4.0 4.8 4.6  CL 98 93* 95*  CO2 26 29 26   BUN 15 26* 31*  CREATININE 3.26* 4.73* 5.13*  CALCIUM 7.6* 8.1* 8.1*  GLUCOSE 139* 142* 109*    Imaging/Diagnostic Tests: Dg Chest 1 View IMPRESSION: No acute disease. Cardiomegaly. Atherosclerosis. Electronically Signed   By: Inge Rise M.D.   On: 08/05/2017 13:09   Dg Knee 1-2 Views Right IMPRESSION: No focal bone abnormality. Soft tissue swelling. Electronically Signed   By: Kerby Moors M.D.   On: 07/25/2017 17:26   Dg Tibia/fibula Right IMPRESSION: No acute abnormality. Atherosclerosis. Electronically Signed   By: Inge Rise M.D.   On: 07/25/2017 17:28   Ct Extremity Lower Right Wo Contrast 1. No findings of abscess or osteomyelitis. 2. Skin disruption along the posterior calf with some confluent regional  subcutaneous edema potentially reflecting cellulitis. 3. Notable atherosclerotic calcification. 4. Dorsal subcutaneous edema in the foot, nonspecific. Electronically Signed   By: Van Clines M.D.   On: 07/29/2017 17:07   Matilde Haymaker, MD 08/10/2017, 6:03 AM PGY-1, [place] Intern pager: [phone], text pages welcome

## 2017-08-10 NOTE — Progress Notes (Signed)
HD tx rescheduled for 7/17 per Dr. Justin Mend d/t census/emergency in the unit. Primary nurse, Verdis Frederickson, RN was notified of this change as well

## 2017-08-10 NOTE — Social Work (Signed)
CSW has confirmed that [facility] is able to accept pt post dialysis.  CSW also spoke with Education officer, museum at the [place] in [place]- pt will have [place] transportation to SNF today at 4:30pm.   If pt stable and okay for transport CSW will provide RN with number to call report.   [place], [place] Work [phone]

## 2017-08-15 DIAGNOSIS — M25561 Pain in right knee: Secondary | ICD-10-CM | POA: Diagnosis not present

## 2017-08-15 DIAGNOSIS — L308 Other specified dermatitis: Secondary | ICD-10-CM | POA: Diagnosis not present

## 2017-08-15 DIAGNOSIS — I2510 Atherosclerotic heart disease of native coronary artery without angina pectoris: Secondary | ICD-10-CM | POA: Diagnosis not present

## 2017-08-15 DIAGNOSIS — M6281 Muscle weakness (generalized): Secondary | ICD-10-CM | POA: Diagnosis not present

## 2017-08-15 DIAGNOSIS — N186 End stage renal disease: Secondary | ICD-10-CM | POA: Diagnosis not present

## 2017-09-23 DIAGNOSIS — I679 Cerebrovascular disease, unspecified: Secondary | ICD-10-CM | POA: Diagnosis not present

## 2017-09-23 DIAGNOSIS — I2510 Atherosclerotic heart disease of native coronary artery without angina pectoris: Secondary | ICD-10-CM | POA: Diagnosis not present

## 2017-09-23 DIAGNOSIS — M6281 Muscle weakness (generalized): Secondary | ICD-10-CM | POA: Diagnosis not present

## 2017-09-23 DIAGNOSIS — N186 End stage renal disease: Secondary | ICD-10-CM | POA: Diagnosis not present

## 2017-11-28 DIAGNOSIS — N186 End stage renal disease: Secondary | ICD-10-CM | POA: Diagnosis not present

## 2017-11-28 DIAGNOSIS — D638 Anemia in other chronic diseases classified elsewhere: Secondary | ICD-10-CM | POA: Diagnosis not present

## 2017-11-28 DIAGNOSIS — S01311A Laceration without foreign body of right ear, initial encounter: Secondary | ICD-10-CM | POA: Diagnosis not present

## 2017-12-05 ENCOUNTER — Other Ambulatory Visit: Payer: Self-pay

## 2017-12-05 ENCOUNTER — Inpatient Hospital Stay (HOSPITAL_COMMUNITY)
Admission: EM | Admit: 2017-12-05 | Discharge: 2017-12-14 | DRG: 377 | Disposition: A | Discharge status: Skilled Nursing Facility | Payer: Medicare Other | Attending: Internal Medicine | Admitting: Internal Medicine

## 2017-12-05 ENCOUNTER — Inpatient Hospital Stay (HOSPITAL_COMMUNITY): Payer: Medicare Other

## 2017-12-05 ENCOUNTER — Emergency Department (HOSPITAL_COMMUNITY): Payer: Medicare Other

## 2017-12-05 ENCOUNTER — Encounter (HOSPITAL_COMMUNITY): Payer: Self-pay

## 2017-12-05 DIAGNOSIS — E1122 Type 2 diabetes mellitus with diabetic chronic kidney disease: Secondary | ICD-10-CM | POA: Diagnosis not present

## 2017-12-05 DIAGNOSIS — R1084 Generalized abdominal pain: Secondary | ICD-10-CM

## 2017-12-05 DIAGNOSIS — M898X9 Other specified disorders of bone, unspecified site: Secondary | ICD-10-CM | POA: Diagnosis present

## 2017-12-05 DIAGNOSIS — Z8673 Personal history of transient ischemic attack (TIA), and cerebral infarction without residual deficits: Secondary | ICD-10-CM | POA: Diagnosis not present

## 2017-12-05 DIAGNOSIS — R402142 Coma scale, eyes open, spontaneous, at arrival to emergency department: Secondary | ICD-10-CM | POA: Diagnosis present

## 2017-12-05 DIAGNOSIS — R402252 Coma scale, best verbal response, oriented, at arrival to emergency department: Secondary | ICD-10-CM | POA: Diagnosis present

## 2017-12-05 DIAGNOSIS — K260 Acute duodenal ulcer with hemorrhage: Principal | ICD-10-CM | POA: Diagnosis present

## 2017-12-05 DIAGNOSIS — Z794 Long term (current) use of insulin: Secondary | ICD-10-CM

## 2017-12-05 DIAGNOSIS — E871 Hypo-osmolality and hyponatremia: Secondary | ICD-10-CM | POA: Diagnosis not present

## 2017-12-05 DIAGNOSIS — I2510 Atherosclerotic heart disease of native coronary artery without angina pectoris: Secondary | ICD-10-CM | POA: Diagnosis present

## 2017-12-05 DIAGNOSIS — D500 Iron deficiency anemia secondary to blood loss (chronic): Secondary | ICD-10-CM | POA: Diagnosis not present

## 2017-12-05 DIAGNOSIS — T82898A Other specified complication of vascular prosthetic devices, implants and grafts, initial encounter: Secondary | ICD-10-CM | POA: Diagnosis not present

## 2017-12-05 DIAGNOSIS — I132 Hypertensive heart and chronic kidney disease with heart failure and with stage 5 chronic kidney disease, or end stage renal disease: Secondary | ICD-10-CM | POA: Diagnosis present

## 2017-12-05 DIAGNOSIS — T829XXA Unspecified complication of cardiac and vascular prosthetic device, implant and graft, initial encounter: Secondary | ICD-10-CM

## 2017-12-05 DIAGNOSIS — Z992 Dependence on renal dialysis: Secondary | ICD-10-CM

## 2017-12-05 DIAGNOSIS — R7989 Other specified abnormal findings of blood chemistry: Secondary | ICD-10-CM | POA: Diagnosis not present

## 2017-12-05 DIAGNOSIS — R1030 Lower abdominal pain, unspecified: Secondary | ICD-10-CM | POA: Diagnosis not present

## 2017-12-05 DIAGNOSIS — Z7902 Long term (current) use of antithrombotics/antiplatelets: Secondary | ICD-10-CM | POA: Diagnosis not present

## 2017-12-05 DIAGNOSIS — R5381 Other malaise: Secondary | ICD-10-CM | POA: Diagnosis not present

## 2017-12-05 DIAGNOSIS — I5022 Chronic systolic (congestive) heart failure: Secondary | ICD-10-CM | POA: Diagnosis not present

## 2017-12-05 DIAGNOSIS — R1111 Vomiting without nausea: Secondary | ICD-10-CM | POA: Diagnosis not present

## 2017-12-05 DIAGNOSIS — K922 Gastrointestinal hemorrhage, unspecified: Secondary | ICD-10-CM | POA: Diagnosis not present

## 2017-12-05 DIAGNOSIS — I214 Non-ST elevation (NSTEMI) myocardial infarction: Secondary | ICD-10-CM | POA: Diagnosis present

## 2017-12-05 DIAGNOSIS — Z888 Allergy status to other drugs, medicaments and biological substances status: Secondary | ICD-10-CM

## 2017-12-05 DIAGNOSIS — I255 Ischemic cardiomyopathy: Secondary | ICD-10-CM | POA: Diagnosis present

## 2017-12-05 DIAGNOSIS — K31811 Angiodysplasia of stomach and duodenum with bleeding: Secondary | ICD-10-CM | POA: Diagnosis not present

## 2017-12-05 DIAGNOSIS — I248 Other forms of acute ischemic heart disease: Secondary | ICD-10-CM | POA: Diagnosis not present

## 2017-12-05 DIAGNOSIS — R402362 Coma scale, best motor response, obeys commands, at arrival to emergency department: Secondary | ICD-10-CM | POA: Diagnosis present

## 2017-12-05 DIAGNOSIS — K449 Diaphragmatic hernia without obstruction or gangrene: Secondary | ICD-10-CM | POA: Diagnosis present

## 2017-12-05 DIAGNOSIS — D62 Acute posthemorrhagic anemia: Secondary | ICD-10-CM | POA: Diagnosis present

## 2017-12-05 DIAGNOSIS — I878 Other specified disorders of veins: Secondary | ICD-10-CM | POA: Diagnosis not present

## 2017-12-05 DIAGNOSIS — I361 Nonrheumatic tricuspid (valve) insufficiency: Secondary | ICD-10-CM | POA: Diagnosis not present

## 2017-12-05 DIAGNOSIS — J189 Pneumonia, unspecified organism: Secondary | ICD-10-CM | POA: Diagnosis not present

## 2017-12-05 DIAGNOSIS — E785 Hyperlipidemia, unspecified: Secondary | ICD-10-CM | POA: Diagnosis present

## 2017-12-05 DIAGNOSIS — R0902 Hypoxemia: Secondary | ICD-10-CM | POA: Diagnosis not present

## 2017-12-05 DIAGNOSIS — D72829 Elevated white blood cell count, unspecified: Secondary | ICD-10-CM | POA: Diagnosis present

## 2017-12-05 DIAGNOSIS — Z7982 Long term (current) use of aspirin: Secondary | ICD-10-CM

## 2017-12-05 DIAGNOSIS — K861 Other chronic pancreatitis: Secondary | ICD-10-CM | POA: Diagnosis not present

## 2017-12-05 DIAGNOSIS — N200 Calculus of kidney: Secondary | ICD-10-CM | POA: Diagnosis not present

## 2017-12-05 DIAGNOSIS — K921 Melena: Secondary | ICD-10-CM | POA: Diagnosis not present

## 2017-12-05 DIAGNOSIS — I5042 Chronic combined systolic (congestive) and diastolic (congestive) heart failure: Secondary | ICD-10-CM | POA: Diagnosis present

## 2017-12-05 DIAGNOSIS — I120 Hypertensive chronic kidney disease with stage 5 chronic kidney disease or end stage renal disease: Secondary | ICD-10-CM | POA: Diagnosis not present

## 2017-12-05 DIAGNOSIS — R110 Nausea: Secondary | ICD-10-CM | POA: Diagnosis not present

## 2017-12-05 DIAGNOSIS — J90 Pleural effusion, not elsewhere classified: Secondary | ICD-10-CM

## 2017-12-05 DIAGNOSIS — N186 End stage renal disease: Secondary | ICD-10-CM | POA: Diagnosis not present

## 2017-12-05 DIAGNOSIS — D631 Anemia in chronic kidney disease: Secondary | ICD-10-CM | POA: Diagnosis not present

## 2017-12-05 DIAGNOSIS — R1915 Other abnormal bowel sounds: Secondary | ICD-10-CM

## 2017-12-05 DIAGNOSIS — Z955 Presence of coronary angioplasty implant and graft: Secondary | ICD-10-CM

## 2017-12-05 DIAGNOSIS — Z452 Encounter for adjustment and management of vascular access device: Secondary | ICD-10-CM

## 2017-12-05 DIAGNOSIS — R100 Acute abdomen: Secondary | ICD-10-CM | POA: Diagnosis not present

## 2017-12-05 DIAGNOSIS — G9341 Metabolic encephalopathy: Secondary | ICD-10-CM | POA: Diagnosis present

## 2017-12-05 DIAGNOSIS — R578 Other shock: Secondary | ICD-10-CM | POA: Diagnosis not present

## 2017-12-05 DIAGNOSIS — Z66 Do not resuscitate: Secondary | ICD-10-CM | POA: Diagnosis present

## 2017-12-05 DIAGNOSIS — R571 Hypovolemic shock: Secondary | ICD-10-CM | POA: Diagnosis not present

## 2017-12-05 DIAGNOSIS — I4510 Unspecified right bundle-branch block: Secondary | ICD-10-CM | POA: Diagnosis not present

## 2017-12-05 DIAGNOSIS — J9811 Atelectasis: Secondary | ICD-10-CM | POA: Diagnosis not present

## 2017-12-05 DIAGNOSIS — R1911 Absent bowel sounds: Secondary | ICD-10-CM | POA: Diagnosis not present

## 2017-12-05 DIAGNOSIS — D72828 Other elevated white blood cell count: Secondary | ICD-10-CM | POA: Diagnosis not present

## 2017-12-05 DIAGNOSIS — N2581 Secondary hyperparathyroidism of renal origin: Secondary | ICD-10-CM | POA: Diagnosis not present

## 2017-12-05 DIAGNOSIS — E875 Hyperkalemia: Secondary | ICD-10-CM | POA: Diagnosis present

## 2017-12-05 DIAGNOSIS — Z89611 Acquired absence of right leg above knee: Secondary | ICD-10-CM | POA: Diagnosis not present

## 2017-12-05 DIAGNOSIS — E278 Other specified disorders of adrenal gland: Secondary | ICD-10-CM | POA: Diagnosis not present

## 2017-12-05 DIAGNOSIS — D638 Anemia in other chronic diseases classified elsewhere: Secondary | ICD-10-CM | POA: Diagnosis present

## 2017-12-05 DIAGNOSIS — I252 Old myocardial infarction: Secondary | ICD-10-CM | POA: Diagnosis not present

## 2017-12-05 DIAGNOSIS — Z8711 Personal history of peptic ulcer disease: Secondary | ICD-10-CM

## 2017-12-05 DIAGNOSIS — R58 Hemorrhage, not elsewhere classified: Secondary | ICD-10-CM | POA: Diagnosis not present

## 2017-12-05 DIAGNOSIS — N184 Chronic kidney disease, stage 4 (severe): Secondary | ICD-10-CM | POA: Diagnosis present

## 2017-12-05 DIAGNOSIS — K269 Duodenal ulcer, unspecified as acute or chronic, without hemorrhage or perforation: Secondary | ICD-10-CM | POA: Diagnosis not present

## 2017-12-05 DIAGNOSIS — Z885 Allergy status to narcotic agent status: Secondary | ICD-10-CM

## 2017-12-05 DIAGNOSIS — K8020 Calculus of gallbladder without cholecystitis without obstruction: Secondary | ICD-10-CM | POA: Diagnosis not present

## 2017-12-05 DIAGNOSIS — Z951 Presence of aortocoronary bypass graft: Secondary | ICD-10-CM

## 2017-12-05 DIAGNOSIS — Z781 Physical restraint status: Secondary | ICD-10-CM

## 2017-12-05 DIAGNOSIS — Z743 Need for continuous supervision: Secondary | ICD-10-CM | POA: Diagnosis not present

## 2017-12-05 DIAGNOSIS — R001 Bradycardia, unspecified: Secondary | ICD-10-CM | POA: Diagnosis not present

## 2017-12-05 DIAGNOSIS — R400 Somnolence: Secondary | ICD-10-CM | POA: Diagnosis not present

## 2017-12-05 DIAGNOSIS — Z8349 Family history of other endocrine, nutritional and metabolic diseases: Secondary | ICD-10-CM

## 2017-12-05 DIAGNOSIS — R531 Weakness: Secondary | ICD-10-CM | POA: Diagnosis not present

## 2017-12-05 DIAGNOSIS — E1129 Type 2 diabetes mellitus with other diabetic kidney complication: Secondary | ICD-10-CM

## 2017-12-05 DIAGNOSIS — Z4682 Encounter for fitting and adjustment of non-vascular catheter: Secondary | ICD-10-CM | POA: Diagnosis not present

## 2017-12-05 DIAGNOSIS — T8743 Infection of amputation stump, right lower extremity: Secondary | ICD-10-CM | POA: Diagnosis not present

## 2017-12-05 DIAGNOSIS — K263 Acute duodenal ulcer without hemorrhage or perforation: Secondary | ICD-10-CM | POA: Diagnosis not present

## 2017-12-05 DIAGNOSIS — I959 Hypotension, unspecified: Secondary | ICD-10-CM | POA: Diagnosis not present

## 2017-12-05 DIAGNOSIS — R279 Unspecified lack of coordination: Secondary | ICD-10-CM | POA: Diagnosis not present

## 2017-12-05 HISTORY — DX: Other specified disorders of adrenal gland: E27.8

## 2017-12-05 LAB — CBC WITH DIFFERENTIAL/PLATELET
ABS IMMATURE GRANULOCYTES: 0.11 10*3/uL — AB (ref 0.00–0.07)
BASOS PCT: 0 %
Basophils Absolute: 0 10*3/uL (ref 0.0–0.1)
EOS ABS: 0.1 10*3/uL (ref 0.0–0.5)
Eosinophils Relative: 1 %
HEMATOCRIT: 17.4 % — AB (ref 39.0–52.0)
Hemoglobin: 5.1 g/dL — CL (ref 13.0–17.0)
Immature Granulocytes: 1 %
LYMPHS ABS: 1.9 10*3/uL (ref 0.7–4.0)
Lymphocytes Relative: 14 %
MCH: 29.3 pg (ref 26.0–34.0)
MCHC: 29.3 g/dL — ABNORMAL LOW (ref 30.0–36.0)
MCV: 100 fL (ref 80.0–100.0)
MONO ABS: 1.7 10*3/uL — AB (ref 0.1–1.0)
MONOS PCT: 13 %
NEUTROS ABS: 9.3 10*3/uL — AB (ref 1.7–7.7)
Neutrophils Relative %: 71 %
PLATELETS: 344 10*3/uL (ref 150–400)
RBC: 1.74 MIL/uL — ABNORMAL LOW (ref 4.22–5.81)
RDW: 18.6 % — ABNORMAL HIGH (ref 11.5–15.5)
WBC: 13.1 10*3/uL — ABNORMAL HIGH (ref 4.0–10.5)
nRBC: 0.2 % (ref 0.0–0.2)

## 2017-12-05 LAB — COMPREHENSIVE METABOLIC PANEL
ALK PHOS: 69 U/L (ref 38–126)
ALT: 22 U/L (ref 0–44)
AST: 24 U/L (ref 15–41)
Albumin: 2.4 g/dL — ABNORMAL LOW (ref 3.5–5.0)
Anion gap: 11 (ref 5–15)
BILIRUBIN TOTAL: 0.3 mg/dL (ref 0.3–1.2)
BUN: 88 mg/dL — AB (ref 8–23)
CO2: 25 mmol/L (ref 22–32)
CREATININE: 4.03 mg/dL — AB (ref 0.61–1.24)
Calcium: 7.7 mg/dL — ABNORMAL LOW (ref 8.9–10.3)
Chloride: 95 mmol/L — ABNORMAL LOW (ref 98–111)
GFR calc Af Amer: 15 mL/min — ABNORMAL LOW (ref >60)
GFR calc non Af Amer: 13 mL/min — ABNORMAL LOW (ref >60)
Glucose, Bld: 189 mg/dL — ABNORMAL HIGH (ref 70–99)
Potassium: 5.5 mmol/L — ABNORMAL HIGH (ref 3.5–5.1)
Sodium: 131 mmol/L — ABNORMAL LOW (ref 135–145)
TOTAL PROTEIN: 5.3 g/dL — AB (ref 6.5–8.1)

## 2017-12-05 LAB — I-STAT CHEM 8, ED
BUN: 89 mg/dL — AB (ref 8–23)
CREATININE: 4.2 mg/dL — AB (ref 0.61–1.24)
Calcium, Ion: 0.97 mmol/L — ABNORMAL LOW (ref 1.15–1.40)
Chloride: 97 mmol/L — ABNORMAL LOW (ref 98–111)
GLUCOSE: 186 mg/dL — AB (ref 70–99)
HCT: 16 % — ABNORMAL LOW (ref 39.0–52.0)
HEMOGLOBIN: 5.4 g/dL — AB (ref 13.0–17.0)
Potassium: 5.5 mmol/L — ABNORMAL HIGH (ref 3.5–5.1)
Sodium: 129 mmol/L — ABNORMAL LOW (ref 135–145)
TCO2: 26 mmol/L (ref 22–32)

## 2017-12-05 LAB — GLUCOSE, CAPILLARY: GLUCOSE-CAPILLARY: 176 mg/dL — AB (ref 70–99)

## 2017-12-05 LAB — LIPASE, BLOOD: Lipase: 19 U/L (ref 11–51)

## 2017-12-05 LAB — POC OCCULT BLOOD, ED: Fecal Occult Bld: POSITIVE — AB

## 2017-12-05 LAB — PREPARE RBC (CROSSMATCH)

## 2017-12-05 LAB — PROTIME-INR
INR: 1.3
Prothrombin Time: 16.1 seconds — ABNORMAL HIGH (ref 11.4–15.2)

## 2017-12-05 LAB — MRSA PCR SCREENING: MRSA by PCR: NEGATIVE

## 2017-12-05 MED ORDER — MORPHINE SULFATE (PF) 2 MG/ML IV SOLN
1.0000 mg | INTRAVENOUS | Status: DC | PRN
  Administered 2017-12-05 – 2017-12-06 (×3): 1 mg via INTRAVENOUS
  Filled 2017-12-05 (×4): qty 1

## 2017-12-05 MED ORDER — PANTOPRAZOLE SODIUM 40 MG IV SOLR
40.0000 mg | Freq: Two times a day (BID) | INTRAVENOUS | Status: DC
  Administered 2017-12-05 – 2017-12-07 (×4): 40 mg via INTRAVENOUS
  Filled 2017-12-05 (×4): qty 40

## 2017-12-05 MED ORDER — ONDANSETRON HCL 4 MG/2ML IJ SOLN
4.0000 mg | Freq: Four times a day (QID) | INTRAMUSCULAR | Status: DC | PRN
  Administered 2017-12-05 – 2017-12-11 (×3): 4 mg via INTRAVENOUS
  Filled 2017-12-05 (×3): qty 2

## 2017-12-05 MED ORDER — ONDANSETRON HCL 4 MG/2ML IJ SOLN
4.0000 mg | Freq: Once | INTRAMUSCULAR | Status: AC
  Administered 2017-12-05: 4 mg via INTRAVENOUS
  Filled 2017-12-05: qty 2

## 2017-12-05 MED ORDER — ONDANSETRON HCL 4 MG/2ML IJ SOLN
4.0000 mg | Freq: Once | INTRAMUSCULAR | Status: DC
  Filled 2017-12-05: qty 2

## 2017-12-05 MED ORDER — ONDANSETRON HCL 4 MG/2ML IJ SOLN
4.0000 mg | Freq: Once | INTRAMUSCULAR | Status: AC
  Administered 2017-12-05: 4 mg via INTRAVENOUS

## 2017-12-05 MED ORDER — PHENYLEPHRINE 40 MCG/ML (10ML) SYRINGE FOR IV PUSH (FOR BLOOD PRESSURE SUPPORT)
PREFILLED_SYRINGE | INTRAVENOUS | Status: AC
  Administered 2017-12-05: 200 ug
  Filled 2017-12-05: qty 10

## 2017-12-05 MED ORDER — PATIROMER SORBITEX CALCIUM 8.4 G PO PACK
16.8000 g | PACK | Freq: Once | ORAL | Status: DC
  Filled 2017-12-05: qty 2

## 2017-12-05 MED ORDER — ONDANSETRON 4 MG PO TBDP: 4.0000 mg | ORAL_TABLET | Freq: Once | ORAL | Status: DC

## 2017-12-05 MED ORDER — CALCIUM GLUCONATE-NACL 1-0.675 GM/50ML-% IV SOLN
1.0000 g | Freq: Once | INTRAVENOUS | Status: AC
  Administered 2017-12-05: 1000 mg via INTRAVENOUS
  Filled 2017-12-05: qty 50

## 2017-12-05 MED ORDER — PIPERACILLIN-TAZOBACTAM 3.375 G IVPB 30 MIN
3.3750 g | Freq: Once | INTRAVENOUS | Status: AC
  Administered 2017-12-05: 3.375 g via INTRAVENOUS
  Filled 2017-12-05: qty 50

## 2017-12-05 MED ORDER — PIPERACILLIN-TAZOBACTAM IN DEX 2-0.25 GM/50ML IV SOLN: 2.2500 g | Freq: Once | INTRAVENOUS | Status: DC

## 2017-12-05 MED ORDER — SODIUM CHLORIDE 0.9 % IV BOLUS
500.0000 mL | Freq: Once | INTRAVENOUS | Status: AC
  Administered 2017-12-05: 500 mL via INTRAVENOUS

## 2017-12-05 MED ORDER — SODIUM CHLORIDE 0.9 % IV SOLN
INTRAVENOUS | Status: DC
  Administered 2017-12-05: 75 mL/h via INTRAVENOUS
  Administered 2017-12-07: 07:00:00 via INTRAVENOUS

## 2017-12-05 MED ORDER — SODIUM CHLORIDE 0.9 % IV SOLN
10.0000 mL/h | Freq: Once | INTRAVENOUS | Status: AC
  Administered 2017-12-05: 10 mL/h via INTRAVENOUS

## 2017-12-05 MED ORDER — INSULIN ASPART 100 UNIT/ML ~~LOC~~ SOLN
0.0000 [IU] | SUBCUTANEOUS | Status: DC
  Administered 2017-12-05: 3 [IU] via SUBCUTANEOUS
  Administered 2017-12-06: 2 [IU] via SUBCUTANEOUS
  Administered 2017-12-06 (×2): 3 [IU] via SUBCUTANEOUS
  Administered 2017-12-06 (×2): 5 [IU] via SUBCUTANEOUS
  Administered 2017-12-07: 8 [IU] via SUBCUTANEOUS
  Administered 2017-12-07: 5 [IU] via SUBCUTANEOUS
  Administered 2017-12-07: 3 [IU] via SUBCUTANEOUS
  Administered 2017-12-07: 8 [IU] via SUBCUTANEOUS
  Administered 2017-12-08: 3 [IU] via SUBCUTANEOUS
  Administered 2017-12-08 (×2): 2 [IU] via SUBCUTANEOUS
  Administered 2017-12-08 – 2017-12-09 (×3): 3 [IU] via SUBCUTANEOUS
  Administered 2017-12-09 (×2): 2 [IU] via SUBCUTANEOUS
  Administered 2017-12-09 (×2): 5 [IU] via SUBCUTANEOUS
  Administered 2017-12-10: 3 [IU] via SUBCUTANEOUS
  Administered 2017-12-10: 5 [IU] via SUBCUTANEOUS
  Administered 2017-12-10 (×3): 3 [IU] via SUBCUTANEOUS
  Administered 2017-12-11: 5 [IU] via SUBCUTANEOUS
  Administered 2017-12-11 (×4): 2 [IU] via SUBCUTANEOUS
  Administered 2017-12-12: 5 [IU] via SUBCUTANEOUS
  Administered 2017-12-12: 2 [IU] via SUBCUTANEOUS
  Administered 2017-12-12: 3 [IU] via SUBCUTANEOUS
  Administered 2017-12-12 – 2017-12-13 (×5): 2 [IU] via SUBCUTANEOUS
  Administered 2017-12-13: 5 [IU] via SUBCUTANEOUS
  Administered 2017-12-14: 2 [IU] via SUBCUTANEOUS
  Administered 2017-12-14: 5 [IU] via SUBCUTANEOUS

## 2017-12-05 MED ORDER — CHLORHEXIDINE GLUCONATE CLOTH 2 % EX PADS
6.0000 | MEDICATED_PAD | Freq: Every day | CUTANEOUS | Status: DC
  Administered 2017-12-07 – 2017-12-13 (×7): 6 via TOPICAL

## 2017-12-05 NOTE — ED Notes (Signed)
IV team at bedside. Pt and wife aware a urine is requested.

## 2017-12-05 NOTE — ED Notes (Signed)
Provider aware of Hgb.

## 2017-12-05 NOTE — ED Notes (Signed)
Lab results given to Dr.Yao. 

## 2017-12-05 NOTE — ED Notes (Signed)
Pt aware of the need of a UA sample

## 2017-12-05 NOTE — ED Notes (Signed)
BP noted aws 50/34. Dr. Darl Householder and Rozier called to CT and at pt bedsdie.

## 2017-12-05 NOTE — ED Notes (Signed)
Granddaughter Estill Bamberg called X 5.  Wants an update regarding patient.  PLEASE call @ [phone]

## 2017-12-05 NOTE — ED Provider Notes (Signed)
[facility]MENT Provider Note   CSN: [id number] Arrival date & time: 12/05/17  1403     History   Chief Complaint Chief Complaint  Patient presents with  . Abdominal Pain  . Altered Mental Status  . Nausea  . Emesis    HPI [facility] Garcia is a 72 y.o. male.  72 year old male brought in by EMS from [facility] for abdominal pain, nausea, lethargy.  Per EMS, patient has complained of abdominal pain for the past week with nausea, pain became worse today.  EMS reports vomiting while at the nursing home today, patient declines and states he feels very nauseous.  Patient denies changes in bowel habits.  Patient's wife is at bedside, states that he looks very swollen and yellow.  Patient attends dialysis Tuesday, Thursday, Saturday.  Patient has a chronic wound to his left lower leg being treated at the wound care center for this as well as a wound to his right lower AKA.  Patient reports bedsores, states he is been treating these on his own with cream and has not notified his nursing home staff.  Prior abdominal surgery after GI bleed, unable to specify, no abdominal scars. No other complaints or concerns.      Past Medical History:  Diagnosis Date  . Acute kidney injury superimposed on chronic kidney disease ([place]) 10/15/2016  . Altered mental state 01/25/2013  . Anemia of chronic disease 01/25/2013  . CAD S/P percutaneous coronary angioplasty    a.not to be a candidate for CABG. On 9/19, went back to cath lab for PCI s/p orbital atherectomy with DES to ostial RCA, and PTCA/DES to distal RCA. Residual 50% prox LAD, 70% D1, CTO mLCx noted by initial diagnostic cath 10/08/16 to be treated medically.  . Cancer ([place])   . Cellulitis 07/2017   right leg  . Cellulitis of right lower extremity 07/25/2017  . Chronic combined systolic and diastolic CHF (congestive heart failure) ([place])   . CKD (chronic kidney disease), stage IV ([place])   . Diabetes  mellitus with renal complications (HCC)    uncontrolled  . Essential hypertension 10/15/2016  . Humerus head fracture 01/27/2013  . Hyperlipidemia   . Hyponatremia 01/25/2013  . Insulin dependent diabetes mellitus ([place])   . Ischemic cardiomyopathy 10/15/2016  . MI (myocardial infarction) ([place])    years ago  . Stroke ([place])   . Transaminitis 01/25/2013  . Uncontrolled diabetes mellitus ([place]) 10/15/2016    Patient Active Problem List   Diagnosis Date Noted  . BRBPR (bright red blood per rectum)   . Dermatitis   . Skin lesions   . Acute pain of right knee   . Cellulitis 07/25/2017  . Syncope 07/07/2017  . Pressure injury of skin 07/07/2017  . ESRD on dialysis ([place]) 05/24/2017  . Acute blood loss anemia 05/18/2017  . Acute on chronic combined systolic and diastolic CHF (congestive heart failure) ([place]) 05/17/2017  . MI (myocardial infarction) ([place])   . Ischemic cardiomyopathy 10/15/2016  . CAD S/P percutaneous coronary angioplasty 10/15/2016  . Acute kidney injury superimposed on chronic kidney disease ([place]) 10/15/2016  . Uncontrolled diabetes mellitus ([place]) 10/15/2016  . Essential hypertension 10/15/2016  . Acute combined systolic (congestive) and diastolic (congestive) heart failure ([place])   . NSTEMI (non-ST elevated myocardial infarction) ([place]) 10/04/2016  . Acute on chronic diastolic heart failure ([place]) 01/27/2013  . Humerus head fracture 01/27/2013  . Altered mental state 01/25/2013  . Acute renal failure superimposed on chronic  kidney disease ([place]) 01/25/2013  . Transaminitis 01/25/2013  . Leukocytosis 01/25/2013  . Anemia of chronic disease 01/25/2013  . Hyponatremia 01/25/2013  . Hyperglycemia 01/25/2013    Past Surgical History:  Procedure Laterality Date  . COLONOSCOPY WITH PROPOFOL N/A 08/09/2017   Procedure: COLONOSCOPY WITH PROPOFOL Hemostatic Clips;  Surgeon: Clarene Essex, MD;  Location: [facility];  Service: Endoscopy;  Laterality: N/A;  . CORONARY STENT INTERVENTION  N/A 10/13/2016   Procedure: CORONARY STENT INTERVENTION;  Surgeon: Burnell Blanks, MD;  Location: [facility];  Service: Cardiovascular;  Laterality: N/A;  . ESOPHAGOGASTRODUODENOSCOPY (EGD) WITH PROPOFOL N/A 08/08/2017   Procedure: ESOPHAGOGASTRODUODENOSCOPY (EGD) WITH PROPOFOL;  Surgeon: Clarene Essex, MD;  Location: [facility];  Service: Endoscopy;  Laterality: N/A;  . IR FLUORO GUIDE CV LINE RIGHT  05/19/2017  . IR US GUIDE VASC ACCESS RIGHT  05/19/2017  . LEFT HEART CATH AND CORONARY ANGIOGRAPHY N/A 10/08/2016   Procedure: LEFT HEART CATH AND CORONARY ANGIOGRAPHY;  Surgeon: Nelva Bush, MD;  Location: [facility];  Service: Cardiovascular;  Laterality: N/A;  . SCHLEROTHERAPY  08/09/2017   Procedure: Woodward Ku OF AVM;  Surgeon: Clarene Essex, MD;  Location: [facility];  Service: Endoscopy;;  . SKIN CANCER EXCISION    . TEMPORARY PACEMAKER N/A 10/13/2016   Procedure: Temporary Pacemaker;  Surgeon: Burnell Blanks, MD;  Location: [facility];  Service: Cardiovascular;  Laterality: N/A;        Home Medications    Prior to Admission medications   Medication Sig Start Date End Date Taking? Authorizing Provider  acetaminophen (TYLENOL) 325 MG tablet Take 2 tablets (650 mg total) by mouth every 4 (four) hours as needed for mild pain, fever or headache. 05/24/17  Yes Emokpae, Courage, MD  Acetylcarnitine HCl (ACETYL L-CARNITINE PO) Take 2 capsules by mouth daily.   Yes [provider]  ALPRAZolam Duanne Moron) 0.5 MG tablet Take 1 tablet (0.5 mg total) by mouth at bedtime as needed for anxiety. Patient taking differently: Take 0.5 mg by mouth at bedtime. Additional 0.5 mg as needed three times a day for Anxiety 02/01/13  Yes Theodis Blaze, MD  aspirin EC 81 MG tablet Take 81 mg by mouth daily.   Yes [provider]  atorvastatin (LIPITOR) 80 MG tablet Take 80 mg by mouth at bedtime.   Yes [provider]  busPIRone (BUSPAR) 5 MG  tablet Take 2.5 mg by mouth 2 (two) times daily.   Yes [provider]  Ca Carbonate-Mag Hydroxide 550-110 MG CHEW Chew 1-2 tablets by mouth every 4 (four) hours as needed.   Yes [provider]  carvedilol (COREG) 3.125 MG tablet Take 3.125 mg by mouth 2 (two) times daily with a meal.   Yes [provider]  Cholecalciferol (VITAMIN D3) 50 MCG (2000 UT) TABS Take 2,000 Units by mouth daily.   Yes [provider]  cinacalcet (SENSIPAR) 30 MG tablet Take 30 mg by mouth daily.   Yes [provider]  ciprofloxacin (CIPRO) 250 MG tablet Take 250 mg by mouth 2 (two) times daily. 11/25/17 12/05/17 Yes [provider]  clopidogrel (PLAVIX) 75 MG tablet Take 75 mg by mouth daily.   Yes [provider]  Darbepoetin Alfa (ARANESP) 60 MCG/0.3ML SOSY injection Inject 60 mcg into the skin once a week.   Yes [provider]  docusate sodium 100 MG CAPS Take 100 mg by mouth 2 (two) times daily. 02/01/13  Yes Theodis Blaze, MD  fluoruracil (CARAC) 0.5 % cream Apply 1 application topically 2 (two) times daily as needed.    Yes [provider]  gabapentin (NEURONTIN) 300 MG capsule Take 1 capsule (300 mg total) by mouth at bedtime. 05/24/17  Yes Emokpae, Courage, MD  Insulin Detemir (LEVEMIR) 100 UNIT/ML Pen Inject 5 Units into the skin at bedtime.   Yes [provider]  isosorbide-hydrALAZINE (BIDIL) 20-37.5 MG tablet Take 1 tablet by mouth 2 (two) times daily. 05/24/17  Yes Emokpae, Courage, MD  ketoconazole (NIZORAL) 2 % cream Apply 1 application topically 2 (two) times daily as needed.   Yes [provider]  ketorolac (ACULAR) 0.5 % ophthalmic solution Place 1 drop into the left eye 4 (four) times daily as needed.   Yes [provider]  lactobacillus acidophilus (BACID) TABS tablet Take 1 tablet by mouth 2 (two) times daily. 250 mg   Yes [provider]  Melatonin 3 MG CAPS Take 3 mg by mouth at bedtime  as needed.    Yes [provider]  metolazone (ZAROXOLYN) 2.5 MG tablet Take 2.5 mg by mouth daily.   Yes [provider]  midodrine (PROAMATINE) 5 MG tablet Take 5 mg by mouth 2 (two) times daily as needed (unknown).   Yes [provider]  multivitamin (RENA-VIT) TABS tablet Take 1 tablet by mouth at bedtime. 05/24/17  Yes Emokpae, Courage, MD  nitroGLYCERIN (NITROSTAT) 0.4 MG SL tablet Place 1 tablet (0.4 mg total) under the tongue every 5 (five) minutes x 3 doses as needed for chest pain. 10/18/16  Yes Cheryln Manly, NP  ondansetron (ZOFRAN) 4 MG tablet Take 4 mg by mouth every 8 (eight) hours as needed for nausea or vomiting (x72 hours. If not effective after 2 doses, increase to 8 mg).   Yes [provider]  oxyCODONE (OXY IR/ROXICODONE) 5 MG immediate release tablet Take 1 tablet (5 mg total) by mouth every 8 (eight) hours as needed for breakthrough pain. 08/10/17  Yes Matilde Haymaker, MD  oxyCODONE (ROXICODONE) 5 MG immediate release tablet Take 1 tablet (5 mg total) by mouth every 4 (four) hours as needed for severe pain. Patient taking differently: Take 5 mg by mouth daily.  08/10/17  Yes Matilde Haymaker, MD  OXYGEN Inhale 2 L into the lungs as needed (shortness of breath).   Yes [provider]  primidone (MYSOLINE) 50 MG tablet Take 100 mg by mouth daily.   Yes [provider]  sevelamer carbonate (RENVELA) 800 MG tablet Take 2 tablets (1,600 mg total) by mouth 3 (three) times daily with meals. 07/10/17  Yes Diallo, Abdoulaye, MD  traMADol (ULTRAM) 50 MG tablet Take 50 mg by mouth 3 (three) times daily as needed for moderate pain or severe pain.    Yes [provider]  vitamin B-12 (CYANOCOBALAMIN) 500 MCG tablet Take 1,000 mcg by mouth daily.   Yes [provider]  triamcinolone cream (KENALOG) 0.1 % Apply topically 2 (two) times daily. Patient not taking: Reported on 12/05/2017 08/10/17   Matilde Haymaker, MD    Family  History Family History  Problem Relation Age of Onset  . Hyperlipidemia Father     Social History Social History   Tobacco Use  . Smoking status: Never Smoker  . Smokeless tobacco: Never Used  Substance Use Topics  . Alcohol use: No  . Drug use: No     Allergies   Oxycodone; Tramadol; Lisinopril; and Losartan   Review of Systems Review of Systems  Constitutional: Negative for chills, diaphoresis and fever.  Respiratory: Negative for shortness of breath.   Cardiovascular: Positive for leg swelling. Negative for chest pain.  Gastrointestinal: Positive for abdominal distention, abdominal pain and nausea. Negative for constipation, diarrhea and vomiting.  Skin: Positive for wound.  Allergic/Immunologic: Positive for immunocompromised state.  Neurological: Positive for weakness.  Psychiatric/Behavioral: Negative for confusion.  All other systems reviewed and are negative.    Physical Exam Updated Vital Signs BP (!) 114/50   Pulse 67   Resp (!) 23   Ht 5\' 10"  (1.778 m)   Wt 86.2 kg   SpO2 100%   BMI 27.26 kg/m   Physical Exam  Constitutional: He is oriented to person, place, and time. He appears well-developed and well-nourished. No distress.  HENT:  Head: Normocephalic and atraumatic.  Cardiovascular: Normal rate, regular rhythm and normal heart sounds.  Pulmonary/Chest: Effort normal.  Mild course lung sounds throughout   Abdominal: He exhibits distension. Bowel sounds are increased. There is generalized tenderness.  Genitourinary: Rectal exam shows guaiac positive stool.  Genitourinary Comments: Stool dark/red, hemoccult positive   Neurological: He is alert and oriented to person, place, and time. GCS eye subscore is 4. GCS verbal subscore is 5. GCS motor subscore is 6.  Skin: Skin is warm and dry. He is not diaphoretic. There is pallor.     Psychiatric: He has a normal mood and affect. His behavior is normal.  Nursing note and vitals reviewed.    ED  Treatments / Results  Labs (all labs ordered are listed, but only abnormal results are displayed) Labs Reviewed  COMPREHENSIVE METABOLIC PANEL - Abnormal; Notable for the following components:      Result Value   Sodium 131 (*)    Potassium 5.5 (*)    Chloride 95 (*)    Glucose, Bld 189 (*)    BUN 88 (*)    Creatinine, Ser 4.03 (*)    Calcium 7.7 (*)    Total Protein 5.3 (*)    Albumin 2.4 (*)    GFR calc non Af Amer 13 (*)    GFR calc Af Amer 15 (*)    All other components within normal limits  CBC WITH DIFFERENTIAL/PLATELET - Abnormal; Notable for the following components:   WBC 13.1 (*)    RBC 1.74 (*)    Hemoglobin 5.1 (*)    HCT 17.4 (*)    MCHC 29.3 (*)    RDW 18.6 (*)    Neutro Abs 9.3 (*)    Monocytes Absolute 1.7 (*)    Abs Immature Granulocytes 0.11 (*)    All other components within normal limits  POC OCCULT BLOOD, ED - Abnormal; Notable for the following components:   Fecal Occult Bld POSITIVE (*)    All other components within normal limits  I-STAT CHEM 8, ED - Abnormal; Notable for the following components:   Sodium 129 (*)    Potassium 5.5 (*)    Chloride 97 (*)    BUN 89 (*)    Creatinine, Ser 4.20 (*)    Glucose, Bld 186 (*)    Calcium, Ion 0.97 (*)    Hemoglobin 5.4 (*)    HCT 16.0 (*)    All other components within normal limits  CULTURE, BLOOD (ROUTINE X 2)  CULTURE, BLOOD (ROUTINE X 2)  LIPASE, BLOOD  URINALYSIS, ROUTINE W REFLEX MICROSCOPIC  TYPE AND SCREEN  PREPARE RBC (CROSSMATCH)    EKG EKG Interpretation  Date/Time:  Monday December 05 2017 14:35:56 EST Ventricular Rate:  76 PR Interval:    QRS Duration: 167 QT Interval:  413 QTC Calculation: 465 R Axis:   85 Text Interpretation:  Sinus rhythm Prolonged PR interval Right bundle branch block Repol abnrm suggests ischemia, diffuse leads Confirmed by Quintella Reichert [phone]) on 12/05/2017 2:38:14 PM   Radiology Dg Chest Portable 1 View  Result Date: 12/05/2017 CLINICAL DATA:   Left leg edema. EXAM: PORTABLE CHEST 1 VIEW COMPARISON:  Radiograph August 05, 2017. FINDINGS: Stable cardiomegaly. Atherosclerosis of thoracic aorta is noted. Right internal jugular dialysis catheter is unchanged in position. No pneumothorax is noted. Mild bibasilar subsegmental atelectasis is noted with bilateral pleural effusions, left greater than right. Bony thorax is unremarkable. IMPRESSION: Mild bibasilar subsegmental atelectasis is noted with bilateral pleural effusions, left greater than right. Aortic Atherosclerosis (ICD10-I70.0). Electronically Signed   By: Marijo Conception, M.D.   On: 12/05/2017 15:30    Procedures .Critical Care Performed by: Tacy Learn, PA-C Authorized by: Tacy Learn, PA-C   Critical care provider statement:    Critical care time (minutes):  45   Critical care was necessary to treat or prevent imminent or life-threatening deterioration of the following conditions:  Metabolic crisis and circulatory failure   Critical care was time spent personally by me on the following activities:  Discussions with consultants, evaluation of patient's response to treatment, examination of patient, ordering and performing treatments and interventions, ordering and review of laboratory studies, ordering and review of radiographic studies, pulse oximetry, re-evaluation of patient's condition, obtaining history from patient or surrogate and review of old charts   (including critical care time)  Medications Ordered in ED Medications  0.9 %  sodium chloride infusion (has no administration in time range)  ondansetron (ZOFRAN) injection 4 mg (4 mg Intravenous Given 12/05/17 1611)     Initial Impression / Assessment and Plan / ED Course  I have reviewed the triage vital signs and the nursing notes.  Pertinent labs & imaging results that were available during my care of the patient were reviewed by me and considered in my medical decision making (see chart for  details).  Clinical Course as of Dec 05 1629  Mon Dec 06, 6214  5525 72 year old male brought in by EMS from nursing facility for abdominal pain with nausea x1 week.  On exam patient has generalized abdominal tenderness with increased bowel sounds.  Patient is pale, right AKA, has a bandage in place over a wound, declines removal of his bandage.  Left leg has pitting edema to the left foot, bandage of her left leg wound, patient refused to have dressings removed.  Patient is found to be anemic with a hemoglobin of 5.1, previous hemoglobin 7, patient's wife at bedside states patient had a GI bleed previously and was found to have 2 bleeding vessels that were tied.  Patient is on dialysis Tuesday, Thursday, Saturday, potassium mildly elevated, hyponatremia. Case discussed with Dr. Ralene Bathe, ER attending who has seen the patient. Care signed out to Dr. Darl Householder pending CT abdomen and consult.  Case discussed with Dr. Jonnie Finner, nephrology, dialysis patient with hgb 5.1 with plan to transfuse 2 units PRBCs, K 5.5. Nephrology to see patient.   [LM]    Clinical Course User Index [LM] Tacy Learn, PA-C   Final Clinical Impressions(s) / ED Diagnoses   Final diagnoses:  Generalized abdominal pain  Gastrointestinal hemorrhage, unspecified gastrointestinal hemorrhage type  Anemia, blood loss  Hyperkalemia  Hyponatremia    ED  Discharge Orders    None       [facility] 12/05/17 1631    Quintella Reichert, MD 12/06/17 [phone]

## 2017-12-05 NOTE — ED Notes (Addendum)
Provider aware of vs , states to go emergently to ct and then get blood on return. Fluids running. RN with pt and monitor.

## 2017-12-05 NOTE — H&P (Addendum)
NAME:  Mitchell Garcia, MRN:  [id number], DOB:  [birth year], LOS: 0 ADMISSION DATE:  12/05/2017, CONSULTATION DATE:  12/05/17 REFERRING MD:  Darl Householder CHIEF COMPLAINT:  GI Bleed    History of Present Illness   Mitchell Garcia is a 72 y.o. Caucasian male nursing home resident ([place]) who presents to [facility] emergency department with abdominal pain, nausea and weakness.  Symptoms apparently worsened earlier this morning; however, abd pain had been going on for 3 weeks.  In ED, he had borderline hypotension.  He was evaluated by GI. CT scan was recommended along with treatment with proton pump inhibitor or H2 blocker. While getting CT, SBP dropped to 60s.  He was treated with IVF boluses and 40 mcg of Neosynephrine.  Hgb was found to be 5.1, for which 2 units PRBC were ordered.  He denies any recent hematemesis, hematochezia, melena.  He denies any recent ASA or NSAID use.  He does take 81 mg ASA daily as well as 75 mg Plavix.  Due to hypotension and significant anemia, PCCM was asked to evaluate for admission.  GI was consulted and recommended CT scan and PPI or H2 blockers.  Nephrology also consulted given ESRD on HD TTS.  He was seen on a previous admission by GI and had EGD / colonoscopy on 08/08/17.  These showed small hiatal hernia, few non bleeding superficial duodenal ulcers.   Review of Systems:   Review of Systems  Constitutional: Negative for chills, diaphoresis, fever, malaise/fatigue and weight loss.  HENT: Negative for nosebleeds and sore throat.   Respiratory: Positive for shortness of breath. Negative for cough, hemoptysis, sputum production and stridor.   Cardiovascular: Positive for orthopnea, claudication and leg swelling. Negative for chest pain and palpitations.  Gastrointestinal: Positive for abdominal pain and nausea. Negative for blood in stool, diarrhea, heartburn, melena and vomiting.  Genitourinary: Negative for dysuria, frequency and urgency.  Skin:  Negative for itching and rash.  Neurological: Positive for tingling and sensory change. Negative for dizziness, loss of consciousness and headaches.  The patient reports shooting pain in the left lower extremity, which has been going on for the past couple of months.     Past Medical History   Past Medical History:  Diagnosis Date  . Acute kidney injury superimposed on chronic kidney disease ([place]) 10/15/2016  . Altered mental state 01/25/2013  . Anemia of chronic disease 01/25/2013  . CAD S/P percutaneous coronary angioplasty    a.not to be a candidate for CABG. On 9/19, went back to cath lab for PCI s/p orbital atherectomy with DES to ostial RCA, and PTCA/DES to distal RCA. Residual 50% prox LAD, 70% D1, CTO mLCx noted by initial diagnostic cath 10/08/16 to be treated medically.  . Cancer ([place])   . Cellulitis 07/2017   right leg  . Cellulitis of right lower extremity 07/25/2017  . Chronic combined systolic and diastolic CHF (congestive heart failure) ([place])   . CKD (chronic kidney disease), stage IV ([place])   . Diabetes mellitus with renal complications (HCC)    uncontrolled  . Essential hypertension 10/15/2016  . Humerus head fracture 01/27/2013  . Hyperlipidemia   . Hyponatremia 01/25/2013  . Insulin dependent diabetes mellitus ([place])   . Ischemic cardiomyopathy 10/15/2016  . MI (myocardial infarction) ([place])    years ago  . Stroke ([place])   . Transaminitis 01/25/2013  . Uncontrolled diabetes mellitus ([place]) 10/15/2016    Surgical History    Past Surgical History:  Procedure Laterality  Date  . COLONOSCOPY WITH PROPOFOL N/A 08/09/2017   Procedure: COLONOSCOPY WITH PROPOFOL Hemostatic Clips;  Surgeon: Clarene Essex, MD;  Location: [facility];  Service: Endoscopy;  Laterality: N/A;  . CORONARY STENT INTERVENTION N/A 10/13/2016   Procedure: CORONARY STENT INTERVENTION;  Surgeon: Burnell Blanks, MD;  Location: [place];  Service: Cardiovascular;  Laterality: N/A;  .  ESOPHAGOGASTRODUODENOSCOPY (EGD) WITH PROPOFOL N/A 08/08/2017   Procedure: ESOPHAGOGASTRODUODENOSCOPY (EGD) WITH PROPOFOL;  Surgeon: Clarene Essex, MD;  Location: [facility];  Service: Endoscopy;  Laterality: N/A;  . IR FLUORO GUIDE CV LINE RIGHT  05/19/2017  . IR US GUIDE VASC ACCESS RIGHT  05/19/2017  . LEFT HEART CATH AND CORONARY ANGIOGRAPHY N/A 10/08/2016   Procedure: LEFT HEART CATH AND CORONARY ANGIOGRAPHY;  Surgeon: Nelva Bush, MD;  Location: [facility];  Service: Cardiovascular;  Laterality: N/A;  . SCHLEROTHERAPY  08/09/2017   Procedure: [place]ward Ku OF AVM;  Surgeon: Clarene Essex, MD;  Location: [facility];  Service: Endoscopy;;  . SKIN CANCER EXCISION    . TEMPORARY PACEMAKER N/A 10/13/2016   Procedure: Temporary Pacemaker;  Surgeon: Burnell Blanks, MD;  Location: [place] [facility];  Service: Cardiovascular;  Laterality: N/A;     Social History   Social History   Socioeconomic History  . Marital status: Married    Spouse name: Not on file  . Number of children: Not on file  . Years of education: Not on file  . Highest education level: Not on file  Occupational History  . Not on file  Social Needs  . Financial resource strain: Not on file  . Food insecurity:    Worry: Not on file    Inability: Not on file  . Transportation needs:    Medical: Not on file    Non-medical: Not on file  Tobacco Use  . Smoking status: Never Smoker  . Smokeless tobacco: Never Used  Substance and Sexual Activity  . Alcohol use: No  . Drug use: No  . Sexual activity: Not on file  Lifestyle  . Physical activity:    Days per week: Not on file    Minutes per session: Not on file  . Stress: Not on file  Relationships  . Social connections:    Talks on phone: Not on file    Gets together: Not on file    Attends religious service: Not on file    Active member of club or organization: Not on file    Attends meetings of clubs or organizations: Not on file     Relationship status: Not on file  Other Topics Concern  . Not on file  Social History Narrative  . Not on file    Family History   His family history includes Hyperlipidemia in his father.   Allergies Allergies  Allergen Reactions  . Oxycodone Other (See Comments)    DIFFICULT TO WAKE UP  . Tramadol Nausea And Vomiting  . Lisinopril Other (See Comments)    Hyperkalemia   . Losartan Other (See Comments)    Hyperkalemia      Home Medications  Prior to Admission medications   Medication Sig Start Date End Date Taking? Authorizing Provider  acetaminophen (TYLENOL) 325 MG tablet Take 2 tablets (650 mg total) by mouth every 4 (four) hours as needed for mild pain, fever or headache. 05/24/17  Yes Emokpae, Courage, MD  Acetylcarnitine HCl (ACETYL L-CARNITINE PO) Take 2 capsules by mouth daily.   Yes [provider]  ALPRAZolam (XANAX) 0.5 MG tablet Take 1 tablet (0.5 mg total) by mouth at bedtime as needed for anxiety. Patient taking differently: Take 0.5 mg by mouth at bedtime. Additional 0.5 mg as needed three times a day for Anxiety 02/01/13  Yes Theodis Blaze, MD  aspirin EC 81 MG tablet Take 81 mg by mouth daily.   Yes [provider]  atorvastatin (LIPITOR) 80 MG tablet Take 80 mg by mouth at bedtime.   Yes [provider]  busPIRone (BUSPAR) 5 MG tablet Take 2.5 mg by mouth 2 (two) times daily.   Yes [provider]  Ca Carbonate-Mag Hydroxide 550-110 MG CHEW Chew 1-2 tablets by mouth every 4 (four) hours as needed.   Yes [provider]  carvedilol (COREG) 3.125 MG tablet Take 3.125 mg by mouth 2 (two) times daily with a meal.   Yes [provider]  Cholecalciferol (VITAMIN D3) 50 MCG (2000 UT) TABS Take 2,000 Units by mouth daily.   Yes [provider]  cinacalcet (SENSIPAR) 30 MG tablet Take 30 mg by mouth daily.   Yes [provider]  ciprofloxacin (CIPRO) 250 MG tablet Take 250 mg by mouth 2 (two) times  daily. 11/25/17 12/05/17 Yes [provider]  clopidogrel (PLAVIX) 75 MG tablet Take 75 mg by mouth daily.   Yes [provider]  Darbepoetin Alfa (ARANESP) 60 MCG/0.3ML SOSY injection Inject 60 mcg into the skin once a week.   Yes [provider]  docusate sodium 100 MG CAPS Take 100 mg by mouth 2 (two) times daily. 02/01/13  Yes Theodis Blaze, MD  fluoruracil [facility]) 0.5 % cream Apply 1 application topically 2 (two) times daily as needed.    Yes [provider]  gabapentin (NEURONTIN) 300 MG capsule Take 1 capsule (300 mg total) by mouth at bedtime. 05/24/17  Yes Emokpae, Courage, MD  Insulin Detemir (LEVEMIR) 100 UNIT/ML Pen Inject 5 Units into the skin at bedtime.   Yes [provider]  isosorbide-hydrALAZINE (BIDIL) 20-37.5 MG tablet Take 1 tablet by mouth 2 (two) times daily. 05/24/17  Yes Emokpae, Courage, MD  ketoconazole (NIZORAL) 2 % cream Apply 1 application topically 2 (two) times daily as needed.   Yes [provider]  ketorolac (ACULAR) 0.5 % ophthalmic solution Place 1 drop into the left eye 4 (four) times daily as needed.   Yes [provider]  lactobacillus acidophilus (BACID) TABS tablet Take 1 tablet by mouth 2 (two) times daily. 250 mg   Yes [provider]  Melatonin 3 MG CAPS Take 3 mg by mouth at bedtime as needed.    Yes [provider]  metolazone (ZAROXOLYN) 2.5 MG tablet Take 2.5 mg by mouth daily.   Yes [provider]  midodrine (PROAMATINE) 5 MG tablet Take 5 mg by mouth 2 (two) times daily as needed (unknown).   Yes [provider]  multivitamin (RENA-VIT) TABS tablet Take 1 tablet by mouth at bedtime. 05/24/17  Yes Emokpae, Courage, MD  nitroGLYCERIN (NITROSTAT) 0.4 MG SL tablet Place 1 tablet (0.4 mg total) under the tongue every 5 (five) minutes x 3 doses as needed for chest pain. 10/18/16  Yes Cheryln Manly, NP  ondansetron (ZOFRAN) 4 MG tablet Take 4 mg by mouth every  8 (eight) hours as needed for nausea or vomiting (x72 hours. If not effective after 2 doses, increase to 8 mg).   Yes [provider]  oxyCODONE (OXY IR/ROXICODONE) 5 MG immediate release tablet  Take 1 tablet (5 mg total) by mouth every 8 (eight) hours as needed for breakthrough pain. 08/10/17  Yes Matilde Haymaker, MD  oxyCODONE (ROXICODONE) 5 MG immediate release tablet Take 1 tablet (5 mg total) by mouth every 4 (four) hours as needed for severe pain. Patient taking differently: Take 5 mg by mouth daily.  08/10/17  Yes Matilde Haymaker, MD  OXYGEN Inhale 2 L into the lungs as needed (shortness of breath).   Yes [provider]  primidone (MYSOLINE) 50 MG tablet Take 100 mg by mouth daily.   Yes [provider]  sevelamer carbonate (RENVELA) 800 MG tablet Take 2 tablets (1,600 mg total) by mouth 3 (three) times daily with meals. 07/10/17  Yes Diallo, Abdoulaye, MD  traMADol (ULTRAM) 50 MG tablet Take 50 mg by mouth 3 (three) times daily as needed for moderate pain or severe pain.    Yes [provider]  vitamin B-12 (CYANOCOBALAMIN) 500 MCG tablet Take 1,000 mcg by mouth daily.   Yes [provider]  triamcinolone cream (KENALOG) 0.1 % Apply topically 2 (two) times daily. Patient not taking: Reported on 12/05/2017 08/10/17   Matilde Haymaker, [place] Hospital Events   11/11: admitted with severe anemia (Hgb 5.1)  Consults:  GI: Clarene Essex, MD  Procedures:  N/A  Significant Diagnostic Tests:   Lab Results  Component Value Date   NA 129 (L) 12/05/2017   K 5.5 (H) 12/05/2017   CREATININE 4.20 (H) 12/05/2017   CALCIUM 7.7 (L) 12/05/2017   ALBUMIN 2.4 (L) 12/05/2017   Lab Results  Component Value Date   WBC 13.1 (H) 12/05/2017   HGB 5.4 (LL) 12/05/2017   PLT 344 12/05/2017    Micro Data:  N/A  Antimicrobials:  N/A  Interim history/subjective:  N/A  Objective   Blood pressure (!) 110/42, pulse 73, temperature 97.9 F (36.6 C),  temperature source Oral, resp. rate 18, height 5\' 10"  (1.778 m), weight 86.2 kg, SpO2 (!) 84 %.       No intake or output data in the 24 hours ending 12/05/17 Valerie Roys Filed Weights   12/05/17 1422  Weight: 86.2 kg    Examination: General: Chronically ill appearing male, sitting up in ED stretcher, in NAD. Neuro: A&O x 3, no deficits. HEENT: [place]/AT. Sclerae anicteric, MM pale. Cardiovascular: RRR, no M/R/G.  Lungs: Respirations even and unlabored.  CTA bilaterally, No W/R/R. Abdomen: BS x 4, soft, NT/ND.  Musculoskeletal: Right AKA. Left lower leg in ace wrap (chronic wound) with 1+ pitting edema.,  The patient does not allow physical examination of the left lower extremity wound, due to concerns that it will not be redressed properly.   Skin: Intact, warm, no rashes.    Resolved Hospital Problem list   N/A  Assessment & Plan:  Active Problems:   GI bleed   Leukocytosis   Anemia of chronic disease   DM (diabetes mellitus), type 2 with renal complications (HCC)   Chronic pancreatitis (HCC)   Pleural effusion, bilateral   Left adrenal mass (HCC)   Chronic kidney disease (CKD), stage IV (severe) ([place])   Plan: Admit to ICU. Continue transfusion.  Transfusion target: Hgb 10. 12-lead EKG.  Stat cardiac enzymes.  Patient has a known history of coronary artery disease and myocardial infarction, including inferior wall ischemia.  Need to rule out nausea as an anginal equivalent. Gastroccult/Hemoccult. Sliding scale insulin. Wound care consult for the left lower extremity wound. DVT prophylaxis: Chemoprophylaxis is contraindicated  due to concerns for ongoing bleeding.  Mechanical prophylaxis is not feasible with right AKA status and active left lower extremity wound. GI prophylaxis: Protonix.   Best practice:  Diet: NPO for now due to nausea. Will eventually need renal/diabetic diet. Pain/Anxiety/Delirium protocol (if indicated): Morphine sulfate 1 mg IV every 2 hours as needed for  pain/chest pain VAP protocol (if indicated): N/A DVT prophylaxis: N/A GI prophylaxis: Protonix Glucose control: Insulin sliding scale Mobility: PT/OT evaluation and treatment plan Code Status: FULL CODE Family Communication: Lives with wife, who is currently not at the bedside. Disposition: Keep in ICU today.  Labs   CBC: Recent Labs  Lab 12/05/17 1449 12/05/17 1507  WBC 13.1*  --   NEUTROABS 9.3*  --   HGB 5.1* 5.4*  HCT 17.4* 16.0*  MCV 100.0  --   PLT 344  --     Basic Metabolic Panel: Recent Labs  Lab 12/05/17 1449 12/05/17 1507  NA 131* 129*  K 5.5* 5.5*  CL 95* 97*  CO2 25  --   GLUCOSE 189* 186*  BUN 88* 89*  CREATININE 4.03* 4.20*  CALCIUM 7.7*  --    GFR: Estimated Creatinine Clearance: 15.9 mL/min (A) (by C-G formula based on SCr of 4.2 mg/dL (H)). Recent Labs  Lab 12/05/17 1449  WBC 13.1*    Liver Function Tests: Recent Labs  Lab 12/05/17 1449  AST 24  ALT 22  ALKPHOS 69  BILITOT 0.3  PROT 5.3*  ALBUMIN 2.4*   Recent Labs  Lab 12/05/17 1449  LIPASE 19    ABG    Component Value Date/Time   PHART 7.324 (L) 10/09/2016 0310   PCO2ART 47.2 10/09/2016 0310   PO2ART 61.8 (L) 10/09/2016 0310   HCO3 23.9 10/09/2016 0310   TCO2 26 12/05/2017 1507   ACIDBASEDEF 1.3 10/09/2016 0310   O2SAT 90.3 10/09/2016 0310     HbA1C: Hgb A1c MFr Bld  Date/Time Value Ref Range Status  08/01/2017 06:12 AM 7.3 (H) 4.8 - 5.6 % Final    Comment:    (NOTE) Pre diabetes:          5.7%-6.4% Diabetes:              >6.4% Glycemic control for   <7.0% adults with diabetes   10/05/2016 02:18 AM 7.6 (H) 4.8 - 5.6 % Final    Comment:    (NOTE) Pre diabetes:          5.7%-6.4% Diabetes:              >6.4% Glycemic control for   <7.0% adults with diabetes    Renee Pain, MD Board Certified by the ABIM, Pulmonary Diseases & Critical Care Medicine   Critical care time: 45 minutes.  The treatment and management of the patient's condition was  required based on the threat of imminent deterioration. This time reflects time spent by the physician evaluating, providing care and managing the critically ill patient's care. The time was spent at the immediate bedside (or on the same floor/unit and dedicated to this patient's care). Time involved in separately billable procedures is NOT included int he critical care time indicated above. Family meeting and update time may be included above if and only if the patient is unable/incompetent to participate in clinical interview and/or decision making, and the discussion was necessary to determining treatment decisions.  Renee Pain, MD Board Certified by the ABIM, [place]

## 2017-12-05 NOTE — Consult Note (Signed)
Reason for Consult: Abdominal pain guaiac positive anemia Referring Physician: Hospital team  Mitchell Garcia is an 72 y.o. male.  HPI: Patient known to me from last hospital stay here with guaiac positive anemia and colonoscopy and endoscopy showing ulcers some small polyps and possibly an oozing AVM status post therapy who has had some vague abdominal pain mostly upper which has not been worse with food but has not seen any blood in his bowels or have any lower bowel complaints despite being on lots of antibiotics for his leg and cellulitis and has been in a rest home but denies any aspirin or nonsteroidals and is on dialysis 3 days a week and has no other specific complaints except for occasional nausea without vomiting  Past Medical History:  Diagnosis Date  . Acute kidney injury superimposed on chronic kidney disease ([place]) 10/15/2016  . Altered mental state 01/25/2013  . Anemia of chronic disease 01/25/2013  . CAD S/P percutaneous coronary angioplasty    a.not to be a candidate for CABG. On 9/19, went back to cath lab for PCI s/p orbital atherectomy with DES to ostial RCA, and PTCA/DES to distal RCA. Residual 50% prox LAD, 70% D1, CTO mLCx noted by initial diagnostic cath 10/08/16 to be treated medically.  . Cancer ([place])   . Cellulitis 07/2017   right leg  . Cellulitis of right lower extremity 07/25/2017  . Chronic combined systolic and diastolic CHF (congestive heart failure) ([place])   . CKD (chronic kidney disease), stage IV ([place])   . Diabetes mellitus with renal complications ([facility])    uncontrolled  . Essential hypertension 10/15/2016  . Humerus head fracture 01/27/2013  . Hyperlipidemia   . Hyponatremia 01/25/2013  . Insulin dependent diabetes mellitus ([place])   . Ischemic cardiomyopathy 10/15/2016  . MI (myocardial infarction) ([place])    years ago  . Stroke ([place])   . Transaminitis 01/25/2013  . Uncontrolled diabetes mellitus ([place]) 10/15/2016    Past Surgical History:  Procedure Laterality  Date  . COLONOSCOPY WITH PROPOFOL N/A 08/09/2017   Procedure: COLONOSCOPY WITH PROPOFOL Hemostatic Clips;  Surgeon: Clarene Essex, MD;  Location: [facility];  Service: Endoscopy;  Laterality: N/A;  . CORONARY STENT INTERVENTION N/A 10/13/2016   Procedure: CORONARY STENT INTERVENTION;  Surgeon: Burnell Blanks, MD;  Location: [facility];  Service: Cardiovascular;  Laterality: N/A;  . ESOPHAGOGASTRODUODENOSCOPY (EGD) WITH PROPOFOL N/A 08/08/2017   Procedure: ESOPHAGOGASTRODUODENOSCOPY (EGD) WITH PROPOFOL;  Surgeon: Clarene Essex, MD;  Location: [facility];  Service: Endoscopy;  Laterality: N/A;  . IR FLUORO GUIDE CV LINE RIGHT  05/19/2017  . IR US GUIDE VASC ACCESS RIGHT  05/19/2017  . LEFT HEART CATH AND CORONARY ANGIOGRAPHY N/A 10/08/2016   Procedure: LEFT HEART CATH AND CORONARY ANGIOGRAPHY;  Surgeon: Nelva Bush, MD;  Location: [facility];  Service: Cardiovascular;  Laterality: N/A;  . SCHLEROTHERAPY  08/09/2017   Procedure: Woodward Ku OF AVM;  Surgeon: Clarene Essex, MD;  Location: [facility];  Service: Endoscopy;;  . SKIN CANCER EXCISION    . TEMPORARY PACEMAKER N/A 10/13/2016   Procedure: Temporary Pacemaker;  Surgeon: Burnell Blanks, MD;  Location: [facility];  Service: Cardiovascular;  Laterality: N/A;    Family History  Problem Relation Age of Onset  . Hyperlipidemia Father     Social History:  reports that he has never smoked. He has never used smokeless tobacco. He reports that he does not drink alcohol or use drugs.  Allergies:  Allergies  Allergen  Reactions  . Oxycodone Other (See Comments)    DIFFICULT TO WAKE UP  . Tramadol Nausea And Vomiting  . Lisinopril Other (See Comments)    Hyperkalemia   . Losartan Other (See Comments)    Hyperkalemia     Medications: I have reviewed the patient's current medications.  Results for orders placed or performed during the hospital encounter of 12/05/17 (from the past 48 hour(s))   Comprehensive metabolic panel     Status: Abnormal   Collection Time: 12/05/17  2:49 PM  Result Value Ref Range   Sodium 131 (L) 135 - 145 mmol/L   Potassium 5.5 (H) 3.5 - 5.1 mmol/L   Chloride 95 (L) 98 - 111 mmol/L   CO2 25 22 - 32 mmol/L   Glucose, Bld 189 (H) 70 - 99 mg/dL   BUN 88 (H) 8 - 23 mg/dL   Creatinine, Ser 4.03 (H) 0.61 - 1.24 mg/dL   Calcium 7.7 (L) 8.9 - 10.3 mg/dL   Total Protein 5.3 (L) 6.5 - 8.1 g/dL   Albumin 2.4 (L) 3.5 - 5.0 g/dL   AST 24 15 - 41 U/L   ALT 22 0 - 44 U/L   Alkaline Phosphatase 69 38 - 126 U/L   Total Bilirubin 0.3 0.3 - 1.2 mg/dL   GFR calc non Af Amer 13 (L) >60 mL/min   GFR calc Af Amer 15 (L) >60 mL/min    Comment: (NOTE) The eGFR has been calculated using the CKD EPI equation. This calculation has not been validated in all clinical situations. eGFR's persistently <60 mL/min signify possible Chronic Kidney Disease.    Anion gap 11 5 - 15    Comment: Performed at [facility] [address] [place], [place] [address]  Lipase, blood     Status: None   Collection Time: 12/05/17  2:49 PM  Result Value Ref Range   Lipase 19 11 - 51 U/L    Comment: Performed at [facility], [place] [address] [place] [address]  CBC with Diff     Status: Abnormal   Collection Time: 12/05/17  2:49 PM  Result Value Ref Range   WBC 13.1 (H) 4.0 - 10.5 K/uL   RBC 1.74 (L) 4.22 - 5.81 MIL/uL   Hemoglobin 5.1 (LL) 13.0 - 17.0 g/dL    Comment: REPEATED TO VERIFY THIS CRITICAL RESULT HAS VERIFIED AND BEEN CALLED TO M.GAGE RN BY KATHERINE MCCORMICK ON 11 11 2019 AT 1521, AND HAS BEEN READ BACK. CRITICAL VALUE VERIFIED    HCT 17.4 (L) 39.0 - 52.0 %   MCV 100.0 80.0 - 100.0 fL   MCH 29.3 26.0 - 34.0 pg   MCHC 29.3 (L) 30.0 - 36.0 g/dL   RDW 18.6 (H) 11.5 - 15.5 %   Platelets 344 150 - 400 K/uL   nRBC 0.2 0.0 - 0.2 %   Neutrophils Relative % 71 %   Neutro Abs 9.3 (H) 1.7 - 7.7 K/uL   Lymphocytes Relative 14 %   Lymphs Abs 1.9 0.7 - 4.0  K/uL   Monocytes Relative 13 %   Monocytes Absolute 1.7 (H) 0.1 - 1.0 K/uL   Eosinophils Relative 1 %   Eosinophils Absolute 0.1 0.0 - 0.5 K/uL   Basophils Relative 0 %   Basophils Absolute 0.0 0.0 - 0.1 K/uL   Immature Granulocytes 1 %   Abs Immature Granulocytes 0.11 (H) 0.00 - 0.07 K/uL    Comment: Performed at [facility],  [address], [place], [place] [address]  I-stat Chem 8, ED     Status: Abnormal   Collection Time: 12/05/17  3:07 PM  Result Value Ref Range   Sodium 129 (L) 135 - 145 mmol/L   Potassium 5.5 (H) 3.5 - 5.1 mmol/L   Chloride 97 (L) 98 - 111 mmol/L   BUN 89 (H) 8 - 23 mg/dL   Creatinine, Ser 4.20 (H) 0.61 - 1.24 mg/dL   Glucose, Bld 186 (H) 70 - 99 mg/dL   Calcium, Ion 0.97 (L) 1.15 - 1.40 mmol/L   TCO2 26 22 - 32 mmol/L   Hemoglobin 5.4 (LL) 13.0 - 17.0 g/dL   HCT 16.0 (L) 39.0 - 52.0 %  POC occult blood, ED RN will collect     Status: Abnormal   Collection Time: 12/05/17  3:26 PM  Result Value Ref Range   Fecal Occult Bld POSITIVE (A) NEGATIVE  Type and screen [facility]     Status: None (Preliminary result)   Collection Time: 12/05/17  3:56 PM  Result Value Ref Range   ABO/RH(D) A POS    Antibody Screen PENDING    Sample Expiration      12/08/2017 Performed at [facility], [place] [address] [place], [place] [address]   Prepare RBC     Status: None   Collection Time: 12/05/17  3:56 PM  Result Value Ref Range   Order Confirmation      ORDER PROCESSED BY BLOOD BANK Performed at [facility], [place] [address] [place], [place] [address]     Dg Chest Portable 1 View  Result Date: 12/05/2017 CLINICAL DATA:  Left leg edema. EXAM: PORTABLE CHEST 1 VIEW COMPARISON:  Radiograph August 05, 2017. FINDINGS: Stable cardiomegaly. Atherosclerosis of thoracic aorta is noted. Right internal jugular dialysis catheter is unchanged in position. No pneumothorax is noted. Mild bibasilar subsegmental atelectasis is noted with bilateral  pleural effusions, left greater than right. Bony thorax is unremarkable. IMPRESSION: Mild bibasilar subsegmental atelectasis is noted with bilateral pleural effusions, left greater than right. Aortic Atherosclerosis (ICD10-I70.0). Electronically Signed   By: Marijo Conception, M.D.   On: 12/05/2017 15:30    ROS negative except above Blood pressure (!) 114/50, pulse 67, resp. rate (!) 23, height '5\' 10"'$  (1.778 m), weight 86.2 kg, SpO2 100 %. Physical Exam vital signs stable afebrile no acute distress exam pertinent for his abdomen being soft essentially nontender occasional bowel sounds no guarding or rebound hemoglobin decreased from a baseline of 7.5 to 5.4 BUN up over baseline but creatinine about the same MCV increased  Assessment/Plan: Guaiac positive anemia abdominal pain in a patient with multiple medical problems Plan: Await CT scan will allow clear liquids if no significant findings on CT can advance diet would use either H2 blockers or pump inhibitors and will check on tomorrow  [facility] E 12/05/2017, 4:51 PM

## 2017-12-05 NOTE — ED Provider Notes (Signed)
Transfer of Care Note I assumed care of patient from Suella Broad at 1600.  Agree with history, physical exam and plan.  See their note for further details.    Briefly, the patient is a 72yo male with PMHx of CAD, cancer, CKD, CHF, DM, HTN, HLd, and previous stroke who presents with abdominal pain, altered mental status, nausea and vomiting.  Current plan is as follows: Patient has been found to be significantly anemic with a hemoglobin of 5.1 and thought to have an acute GI bleed.  Currently we are awaiting a CT of the patient's abdomen and pelvis to rule out perforation or other acute intra-abdominal abnormality before admitting the patient for further evaluation and care.  Reassessment: I personally reassessed the patient.  Patient did have episodes of hypotension for which he was given IV fluids and phenylephrine.  His blood pressure responded well.  He continued to [facility] well.  CT of the abdomen and pelvis shows pleural effusions, gallstone, thickening of the urinary bladder, but no definitive cause of patients abdominal pain and hypotension.  GI was consulted given concern for possible GI bleed.  Patient was excepted to the ICU for further evaluation.  Patient with no further acute events under my care.  For further information regarding patient's continued hospital course please see admitting team documentation.  The care of this patient was supervised by Dr. Darl Householder, who agreed with the plan and management of the patient.   Clinical Impression: 1. Generalized abdominal pain   2. Gastrointestinal hemorrhage, unspecified gastrointestinal hemorrhage type   3. Anemia, blood loss   4. Hyperkalemia   5. Hyponatremia   6. Encounter for central line placement     ED Dispo: Admit    Myracle Febres, Chanda Busing, MD 12/06/17 1428    Drenda Freeze, MD 12/06/17 [phone]

## 2017-12-05 NOTE — ED Triage Notes (Signed)
Pt arrives EMS from [facility] with c/o abdominal pain, n/v and lethergy noted by staff this am. Pt states to EMS that abdominal pain x 3 weeks. R AKA and wound noted at left foot. Dressihng noted left ankle

## 2017-12-05 NOTE — ED Notes (Signed)
Pt will not allow dressing to be removed.

## 2017-12-05 NOTE — ED Notes (Addendum)
Pt c/o nausea, Will inform provider c/o pain at left leg.

## 2017-12-05 NOTE — ED Notes (Signed)
Pt given diet gingerale per Dr. Watt Climes.

## 2017-12-05 NOTE — Consult Note (Signed)
Reason for Consult: To manage dialysis and dialysis related needs Referring Physician: CCM  RAMAN FEATHERSTON is an 72 y.o. male with past medical history significant for coronary artery disease, congestive heart failure with decreased ejection fraction at 35%, type 2 diabetes mellitus, history of CVA and also right AKA, left lower extremity wounds with advanced CKD the progressed to ESRD in April 2019.  He is followed mainly at the [place] and receives dialysis at the [facility] on a TTS schedule.  Patient received full dialysis on Saturday.  He resides at [facility] facility.  Patient presented for evaluation of nausea and weakness-some decreased blood pressure.  Labs showed hemoglobin of 5.1 and he is noted to be Hemoccult positive.  He is currently receiving blood transfusion.  We are consulted to manage his dialysis needs.  Of note, pretransfusion potassium was 5.5.  Patient seems very sad and depressed.  Nursing reports an episode of blood pressure going down into the 50s but now appears to be over 100.  Patient is mentating but tangential with his conversation.  He is still nauseated.  His oxygen saturation is good on no oxygen but he does admit to orthopnea   Dialyzes at [place] unknown.  He runs 4 hours on TTS HD [place] unknown, Dialyzer unknown, Heparin unknown. Access has a right chest tunneled dialysis catheter but also a maturing right arm AV fistula.  He tells me they were to start using it soon.  According to consult note from July patient is on heparin  Past Medical History:  Diagnosis Date  . Acute kidney injury superimposed on chronic kidney disease ([place]) 10/15/2016  . Altered mental state 01/25/2013  . Anemia of chronic disease 01/25/2013  . CAD S/P percutaneous coronary angioplasty    a.not to be a candidate for CABG. On 9/19, went back to cath lab for PCI s/p orbital atherectomy with DES to ostial RCA, and PTCA/DES to distal RCA. Residual 50% prox LAD, 70% D1, CTO mLCx  noted by initial diagnostic cath 10/08/16 to be treated medically.  . Cancer ([place])   . Cellulitis 07/2017   right leg  . Cellulitis of right lower extremity 07/25/2017  . Chronic combined systolic and diastolic CHF (congestive heart failure) ([place])   . CKD (chronic kidney disease), stage IV ([place])   . Diabetes mellitus with renal complications (H[facility])    uncontrolled  . Essential hypertension 10/15/2016  . Humerus head fracture 01/27/2013  . Hyperlipidemia   . Hyponatremia 01/25/2013  . Insulin dependent diabetes mellitus ([place])   . Ischemic cardiomyopathy 10/15/2016  . MI (myocardial infarction) ([place])    years ago  . Stroke ([place])   . Transaminitis 01/25/2013  . Uncontrolled diabetes mellitus ([place]) 10/15/2016    Past Surgical History:  Procedure Laterality Date  . COLONOSCOPY WITH PROPOFOL N/A 08/09/2017   Procedure: COLONOSCOPY WITH PROPOFOL Hemostatic Clips;  Surgeon: Clarene Essex, MD;  Location: [facility];  Service: Endoscopy;  Laterality: N/A;  . CORONARY STENT INTERVENTION N/A 10/13/2016   Procedure: CORONARY STENT INTERVENTION;  Surgeon: Burnell Blanks, MD;  Location: [facility];  Service: Cardiovascular;  Laterality: N/A;  . ESOPHAGOGASTRODUODENOSCOPY (EGD) WITH PROPOFOL N/A 08/08/2017   Procedure: ESOPHAGOGASTRODUODENOSCOPY (EGD) WITH PROPOFOL;  Surgeon: Clarene Essex, MD;  Location: [facility];  Service: Endoscopy;  Laterality: N/A;  . IR FLUORO GUIDE CV LINE RIGHT  05/19/2017  . IR US GUIDE VASC ACCESS RIGHT  05/19/2017  . LEFT HEART CATH AND CORONARY ANGIOGRAPHY N/A 10/08/2016  Procedure: LEFT HEART CATH AND CORONARY ANGIOGRAPHY;  Surgeon: Nelva Bush, MD;  Location: [facility];  Service: Cardiovascular;  Laterality: N/A;  . SCHLEROTHERAPY  08/09/2017   Procedure: Woodward Ku OF AVM;  Surgeon: Clarene Essex, MD;  Location: [facility];  Service: Endoscopy;;  . SKIN CANCER EXCISION    . TEMPORARY PACEMAKER N/A 10/13/2016   Procedure: Temporary Pacemaker;   Surgeon: Burnell Blanks, MD;  Location: [facility];  Service: Cardiovascular;  Laterality: N/A;    Family History  Problem Relation Age of Onset  . Hyperlipidemia Father     Social History:  reports that he has never smoked. He has never used smokeless tobacco. He reports that he does not drink alcohol or use drugs.  Allergies:  Allergies  Allergen Reactions  . Oxycodone Other (See Comments)    DIFFICULT TO WAKE UP  . Tramadol Nausea And Vomiting  . Lisinopril Other (See Comments)    Hyperkalemia   . Losartan Other (See Comments)    Hyperkalemia     Medications: I have reviewed the patient's current medications.   Results for orders placed or performed during the hospital encounter of 12/05/17 (from the past 48 hour(s))  Comprehensive metabolic panel     Status: Abnormal   Collection Time: 12/05/17  2:49 PM  Result Value Ref Range   Sodium 131 (L) 135 - 145 mmol/L   Potassium 5.5 (H) 3.5 - 5.1 mmol/L   Chloride 95 (L) 98 - 111 mmol/L   CO2 25 22 - 32 mmol/L   Glucose, Bld 189 (H) 70 - 99 mg/dL   BUN 88 (H) 8 - 23 mg/dL   Creatinine, Ser 4.03 (H) 0.61 - 1.24 mg/dL   Calcium 7.7 (L) 8.9 - 10.3 mg/dL   Total Protein 5.3 (L) 6.5 - 8.1 g/dL   Albumin 2.4 (L) 3.5 - 5.0 g/dL   AST 24 15 - 41 U/L   ALT 22 0 - 44 U/L   Alkaline Phosphatase 69 38 - 126 U/L   Total Bilirubin 0.3 0.3 - 1.2 mg/dL   GFR calc non Af Amer 13 (L) >60 mL/min   GFR calc Af Amer 15 (L) >60 mL/min    Comment: (NOTE) The eGFR has been calculated using the CKD EPI equation. This calculation has not been validated in all clinical situations. eGFR's persistently <60 mL/min signify possible Chronic Kidney Disease.    Anion gap 11 5 - 15    Comment: Performed at [facility] [address], [place], [place] [address]  Lipase, blood     Status: None   Collection Time: 12/05/17  2:49 PM  Result Value Ref Range   Lipase 19 11 - 51 U/L    Comment: Performed at [facility], [place] [address] [place] [address]  CBC with Diff     Status: Abnormal   Collection Time: 12/05/17  2:49 PM  Result Value Ref Range   WBC 13.1 (H) 4.0 - 10.5 K/uL   RBC 1.74 (L) 4.22 - 5.81 MIL/uL   Hemoglobin 5.1 (LL) 13.0 - 17.0 g/dL    Comment: REPEATED TO VERIFY THIS CRITICAL RESULT HAS VERIFIED AND BEEN CALLED TO M.GAGE RN BY KATHERINE MCCORMICK ON 11 11 2019 AT 1521, AND HAS BEEN READ BACK. CRITICAL VALUE VERIFIED    HCT 17.4 (L) 39.0 - 52.0 %   MCV 100.0 80.0 - 100.0 fL   MCH 29.3 26.0 - 34.0 pg   MCHC 29.3 (  L) 30.0 - 36.0 g/dL   RDW 18.6 (H) 11.5 - 15.5 %   Platelets 344 150 - 400 K/uL   nRBC 0.2 0.0 - 0.2 %   Neutrophils Relative % 71 %   Neutro Abs 9.3 (H) 1.7 - 7.7 K/uL   Lymphocytes Relative 14 %   Lymphs Abs 1.9 0.7 - 4.0 K/uL   Monocytes Relative 13 %   Monocytes Absolute 1.7 (H) 0.1 - 1.0 K/uL   Eosinophils Relative 1 %   Eosinophils Absolute 0.1 0.0 - 0.5 K/uL   Basophils Relative 0 %   Basophils Absolute 0.0 0.0 - 0.1 K/uL   Immature Granulocytes 1 %   Abs Immature Granulocytes 0.11 (H) 0.00 - 0.07 K/uL    Comment: Performed at [facility] [address] [place], [place] [address]  I-stat Chem 8, ED     Status: Abnormal   Collection Time: 12/05/17  3:07 PM  Result Value Ref Range   Sodium 129 (L) 135 - 145 mmol/L   Potassium 5.5 (H) 3.5 - 5.1 mmol/L   Chloride 97 (L) 98 - 111 mmol/L   BUN 89 (H) 8 - 23 mg/dL   Creatinine, Ser 4.20 (H) 0.61 - 1.24 mg/dL   Glucose, Bld 186 (H) 70 - 99 mg/dL   Calcium, Ion 0.97 (L) 1.15 - 1.40 mmol/L   TCO2 26 22 - 32 mmol/L   Hemoglobin 5.4 (LL) 13.0 - 17.0 g/dL   HCT 16.0 (L) 39.0 - 52.0 %  POC occult blood, ED RN will collect     Status: Abnormal   Collection Time: 12/05/17  3:26 PM  Result Value Ref Range   Fecal Occult Bld POSITIVE (A) NEGATIVE  Type and screen [facility] [place]     Status: None (Preliminary result)   Collection Time: 12/05/17  3:56 PM  Result Value Ref Range   ABO/RH(D)  A POS    Antibody Screen NEG    Sample Expiration 12/08/2017    Unit Number [id number]    Blood Component Type RED CELLS,LR    Unit division 00    Status of Unit ISSUED    Transfusion Status OK TO TRANSFUSE    Crossmatch Result      Compatible Performed at [facility], [address] [place], [place] [address]    Unit Number [id number]    Blood Component Type RED CELLS,LR    Unit division 00    Status of Unit ALLOCATED    Transfusion Status OK TO TRANSFUSE    Crossmatch Result Compatible   Prepare RBC     Status: None   Collection Time: 12/05/17  3:56 PM  Result Value Ref Range   Order Confirmation      ORDER PROCESSED BY BLOOD BANK Performed at [facility], [place] [address], [place], [place] [address]     Dg Chest Portable 1 View  Result Date: 12/05/2017 CLINICAL DATA:  Left leg edema. EXAM: PORTABLE CHEST 1 VIEW COMPARISON:  Radiograph August 05, 2017. FINDINGS: Stable cardiomegaly. Atherosclerosis of thoracic aorta is noted. Right internal jugular dialysis catheter is unchanged in position. No pneumothorax is noted. Mild bibasilar subsegmental atelectasis is noted with bilateral pleural effusions, left greater than right. Bony thorax is unremarkable. IMPRESSION: Mild bibasilar subsegmental atelectasis is noted with bilateral pleural effusions, left greater than right. Aortic Atherosclerosis (ICD10-I70.0). Electronically Signed   By: Marijo Conception, M.D.   On: 12/05/2017 15:30   Ct Renal Stone Study  Result Date: 12/05/2017 CLINICAL DATA:  Abdominal pain nausea vomiting and lethargy EXAM: CT ABDOMEN AND PELVIS WITHOUT CONTRAST TECHNIQUE: Multidetector CT imaging of the abdomen and pelvis was performed following the standard protocol without IV contrast. COMPARISON:  None. FINDINGS: Lower chest: Small left pleural effusion. Small moderate right pleural effusion. Mild consolidations at both bases may reflect atelectasis or pneumonia. Cardiomegaly with trace  pericardial effusion. Small gas bubbles in the right atrium. Extensive coronary vascular calcification. Hepatobiliary: Gallstones. No focal hepatic abnormality or biliary dilatation. Pancreas: Coarse calcifications at the head and uncinate process of the pancreas likely reflecting prior pancreatitis. No acute inflammatory changes. Spleen: Normal in size without focal abnormality. Adrenals/Urinary Tract: Nodularity of the left adrenal gland with 17 mm low-density mass incompletely characterized. Kidneys show no hydronephrosis. 3 mm stone mid left kidney. Intrarenal vascular calcification. Punctate stones in the lower pole of the left kidney. Mildly thick-walled appearance of the bladder. Stomach/Bowel: Stomach nonenlarged. No dilated small bowel. No colon wall thickening. Negative appendix. Vascular/Lymphatic: Extensive aortic atherosclerosis. No aneurysm. No significantly enlarged lymph nodes. Reproductive: Coarse prostate calcifications. Other: No free air or free fluid. Musculoskeletal: Degenerative changes. No acute or suspicious abnormality. IMPRESSION: 1. Bilateral pleural effusions, small on the left and small moderate on the right. Partial consolidations in the lower lobes may reflect atelectasis or pneumonia. 2. Cardiomegaly. Small foci of air in the right atrium suspected to be secondary to peripheral line placement 3. Gallstones 4. Negative for hydronephrosis or ureteral stone. Nonobstructing stones in the left kidney. 5. Thick-walled appearance of the urinary bladder, possible cystitis 6. 17 mm low-density left adrenal mass incompletely characterized without contrast. Could perform nonemergent adrenal CT to better evaluate. 7. Coarse calcifications in the region of the pancreatic head and uncinate process likely reflecting chronic pancreatitis. Electronically Signed   By: Donavan Foil M.D.   On: 12/05/2017 17:55    ROS: Positive for orthopnea, mild abdominal pain, nausea Blood pressure (!) 110/42,  pulse 73, temperature 97.9 F (36.6 C), temperature source Oral, resp. rate 18, height '5\' 10"'$  (1.778 m), weight 86.2 kg, SpO2 (!) 84 %. General appearance: appears older than stated age and distracted Eyes: conjunctivae/corneas clear. PERRL, EOM's intact. Fundi benign. Resp: diminished breath sounds bilaterally Cardio: regular rate and rhythm, S1, S2 normal, no murmur, click, rub or gallop GI: soft, non-tender; bowel sounds normal; no masses,  no organomegaly Extremities: edema 1+-right AKA, left lower extremity wrapped Skin: Skin color, texture, turgor normal. No rashes or lesions or Pale Right s sided tunneled dialysis catheter.  Also right forearm AV fistula, slightly small, good thrill  Assessment/Plan: 71 year old white male with multiple medical problems including ESRD and previous GI bleed, now presenting with a hemoglobin of 5.1and GI sxms 1 severe anemia/GIB-do not know what recent hemoglobin has been.  Feature of hospitalization in July was GI bleed status post scopes showing ulcers, polyps and an oozing AVM.  GI has already been consulted.  Receiving 2 units for hemoglobin 5.1.  Reportedly on Plavix as OP 2 ESRD: Normally TTS at the [facility].  Using PermCath but AV fistula is close for use.  Large patient load and dialysis this evening, will plan for dialysis first thing in the morning-no heparin.  Will dose potassium binder tonight  3 Hypertension: Has been hypotensive in the emergency department.  Home medication list includes Coreg and BiDil but also midodrine? Monitor overnight- CCM has been called  4. Anemia of ESRD: Would be prone to anemia given his ESRD.  Currently  receiving transfusion.  Will attempt to find out when ESA was dosed at outpatient kidney center but likely will re-dose here 5. Metabolic Bone Disease: Medication list includes Sensipar 30 daily and Renvela.  Will resume once GI issues resolved 6. Psych- pt seems very depressed asking "is this all worth it"   Stating that 4 hours of dialysis is torture, makes him feel terrible- consider a palliative care consult while in house    Louis Meckel 12/05/2017, 6:55 PM

## 2017-12-05 NOTE — ED Notes (Signed)
Pt statsw nausea continues. "Pt states he wants to throw up as he would feel better."

## 2017-12-05 NOTE — ED Notes (Signed)
IV attempted x2 without success.

## 2017-12-06 ENCOUNTER — Inpatient Hospital Stay (HOSPITAL_COMMUNITY): Payer: Medicare Other

## 2017-12-06 DIAGNOSIS — I248 Other forms of acute ischemic heart disease: Secondary | ICD-10-CM

## 2017-12-06 DIAGNOSIS — I878 Other specified disorders of veins: Secondary | ICD-10-CM

## 2017-12-06 DIAGNOSIS — D638 Anemia in other chronic diseases classified elsewhere: Secondary | ICD-10-CM

## 2017-12-06 LAB — RENAL FUNCTION PANEL
ALBUMIN: 2.5 g/dL — AB (ref 3.5–5.0)
Anion gap: 13 (ref 5–15)
BUN: 81 mg/dL — AB (ref 8–23)
CO2: 21 mmol/L — ABNORMAL LOW (ref 22–32)
Calcium: 8 mg/dL — ABNORMAL LOW (ref 8.9–10.3)
Chloride: 97 mmol/L — ABNORMAL LOW (ref 98–111)
Creatinine, Ser: 3.76 mg/dL — ABNORMAL HIGH (ref 0.61–1.24)
GFR calc Af Amer: 17 mL/min — ABNORMAL LOW (ref >60)
GFR calc non Af Amer: 15 mL/min — ABNORMAL LOW (ref >60)
Glucose, Bld: 181 mg/dL — ABNORMAL HIGH (ref 70–99)
PHOSPHORUS: 4 mg/dL (ref 2.5–4.6)
POTASSIUM: 5.2 mmol/L — AB (ref 3.5–5.1)
SODIUM: 131 mmol/L — AB (ref 135–145)

## 2017-12-06 LAB — BASIC METABOLIC PANEL
ANION GAP: 11 (ref 5–15)
Anion gap: 15 (ref 5–15)
BUN: 95 mg/dL — ABNORMAL HIGH (ref 8–23)
BUN: 89 mg/dL — AB (ref 8–23)
CALCIUM: 8 mg/dL — AB (ref 8.9–10.3)
CHLORIDE: 99 mmol/L (ref 98–111)
CHLORIDE: 98 mmol/L (ref 98–111)
CO2: 19 mmol/L — ABNORMAL LOW (ref 22–32)
CO2: 23 mmol/L (ref 22–32)
CREATININE: 4.13 mg/dL — AB (ref 0.61–1.24)
Calcium: 7.7 mg/dL — ABNORMAL LOW (ref 8.9–10.3)
Creatinine, Ser: 4.53 mg/dL — ABNORMAL HIGH (ref 0.61–1.24)
GFR calc Af Amer: 13 mL/min — ABNORMAL LOW (ref >60)
GFR calc Af Amer: 15 mL/min — ABNORMAL LOW (ref >60)
GFR calc non Af Amer: 12 mL/min — ABNORMAL LOW (ref >60)
GFR, EST NON AFRICAN AMERICAN: 13 mL/min — AB (ref >60)
GLUCOSE: 205 mg/dL — AB (ref 70–99)
Glucose, Bld: 202 mg/dL — ABNORMAL HIGH (ref 70–99)
Potassium: 5.5 mmol/L — ABNORMAL HIGH (ref 3.5–5.1)
Potassium: 6 mmol/L — ABNORMAL HIGH (ref 3.5–5.1)
SODIUM: 132 mmol/L — AB (ref 135–145)
Sodium: 133 mmol/L — ABNORMAL LOW (ref 135–145)

## 2017-12-06 LAB — HEMOGLOBIN AND HEMATOCRIT, BLOOD
HCT: 22.2 % — ABNORMAL LOW (ref 39.0–52.0)
Hemoglobin: 6.9 g/dL — CL (ref 13.0–17.0)

## 2017-12-06 LAB — GLUCOSE, CAPILLARY
GLUCOSE-CAPILLARY: 166 mg/dL — AB (ref 70–99)
GLUCOSE-CAPILLARY: 190 mg/dL — AB (ref 70–99)
GLUCOSE-CAPILLARY: 201 mg/dL — AB (ref 70–99)
GLUCOSE-CAPILLARY: 261 mg/dL — AB (ref 70–99)
Glucose-Capillary: 144 mg/dL — ABNORMAL HIGH (ref 70–99)
Glucose-Capillary: 203 mg/dL — ABNORMAL HIGH (ref 70–99)
Glucose-Capillary: 204 mg/dL — ABNORMAL HIGH (ref 70–99)

## 2017-12-06 LAB — CBC
HCT: 28.7 % — ABNORMAL LOW (ref 39.0–52.0)
HEMATOCRIT: 22.8 % — AB (ref 39.0–52.0)
HEMOGLOBIN: 7.1 g/dL — AB (ref 13.0–17.0)
HEMOGLOBIN: 8.7 g/dL — AB (ref 13.0–17.0)
MCH: 29.7 pg (ref 26.0–34.0)
MCH: 29.2 pg (ref 26.0–34.0)
MCHC: 31.1 g/dL (ref 30.0–36.0)
MCHC: 30.3 g/dL (ref 30.0–36.0)
MCV: 95.4 fL (ref 80.0–100.0)
MCV: 96.3 fL (ref 80.0–100.0)
NRBC: 0 % (ref 0.0–0.2)
Platelets: 289 10*3/uL (ref 150–400)
Platelets: 309 10*3/uL (ref 150–400)
RBC: 2.39 MIL/uL — ABNORMAL LOW (ref 4.22–5.81)
RBC: 2.98 MIL/uL — ABNORMAL LOW (ref 4.22–5.81)
RDW: 17 % — AB (ref 11.5–15.5)
RDW: 16.9 % — ABNORMAL HIGH (ref 11.5–15.5)
WBC: 18.1 10*3/uL — ABNORMAL HIGH (ref 4.0–10.5)
WBC: 16.9 10*3/uL — ABNORMAL HIGH (ref 4.0–10.5)
nRBC: 0.2 % (ref 0.0–0.2)

## 2017-12-06 LAB — PHOSPHORUS: PHOSPHORUS: 4.2 mg/dL (ref 2.5–4.6)

## 2017-12-06 LAB — TROPONIN I
TROPONIN I: 0.74 ng/mL — AB (ref <0.03)
TROPONIN I: 1.25 ng/mL — AB (ref <0.03)
Troponin I: 0.89 ng/mL (ref <0.03)

## 2017-12-06 LAB — PREPARE RBC (CROSSMATCH)

## 2017-12-06 LAB — MAGNESIUM
MAGNESIUM: 2.3 mg/dL (ref 1.7–2.4)
Magnesium: 2.3 mg/dL (ref 1.7–2.4)

## 2017-12-06 MED ORDER — ALTEPLASE 2 MG IJ SOLR
INTRAMUSCULAR | Status: AC
  Administered 2017-12-06: 2 mg
  Filled 2017-12-06: qty 2

## 2017-12-06 MED ORDER — DEXTROSE 50 % IV SOLN
50.0000 mL | Freq: Once | INTRAVENOUS | Status: AC
  Administered 2017-12-06: 50 mL via INTRAVENOUS
  Filled 2017-12-06: qty 50

## 2017-12-06 MED ORDER — OXYCODONE HCL 5 MG PO TABS
ORAL_TABLET | ORAL | Status: AC
  Filled 2017-12-06: qty 1

## 2017-12-06 MED ORDER — INSULIN ASPART 100 UNIT/ML ~~LOC~~ SOLN
10.0000 [IU] | Freq: Once | SUBCUTANEOUS | Status: AC
  Administered 2017-12-06: 10 [IU] via INTRAVENOUS

## 2017-12-06 MED ORDER — COLLAGENASE 250 UNIT/GM EX OINT
TOPICAL_OINTMENT | Freq: Every day | CUTANEOUS | Status: DC
  Administered 2017-12-06: 1 via TOPICAL
  Administered 2017-12-07 – 2017-12-14 (×8): via TOPICAL
  Filled 2017-12-06 (×3): qty 30

## 2017-12-06 MED ORDER — ALBUMIN HUMAN 25 % IV SOLN
INTRAVENOUS | Status: AC
  Filled 2017-12-06: qty 100

## 2017-12-06 MED ORDER — CALCIUM GLUCONATE-NACL 1-0.675 GM/50ML-% IV SOLN
1.0000 g | Freq: Once | INTRAVENOUS | Status: AC
  Administered 2017-12-06: 1000 mg via INTRAVENOUS
  Filled 2017-12-06: qty 50

## 2017-12-06 MED ORDER — NOREPINEPHRINE 4 MG/250ML-% IV SOLN: 0.0000 ug/min | INTRAVENOUS | Status: DC

## 2017-12-06 MED ORDER — ALBUMIN HUMAN 25 % IV SOLN
25.0000 g | Freq: Once | INTRAVENOUS | Status: AC
  Administered 2017-12-06: 25 g via INTRAVENOUS

## 2017-12-06 MED ORDER — ALTEPLASE 2 MG IJ SOLR
2.0000 mg | Freq: Once | INTRAMUSCULAR | Status: AC
  Administered 2017-12-06 (×2): 2 mg

## 2017-12-06 MED ORDER — ALBUTEROL SULFATE (2.5 MG/3ML) 0.083% IN NEBU
2.5000 mg | INHALATION_SOLUTION | Freq: Once | RESPIRATORY_TRACT | Status: AC
  Administered 2017-12-06: 2.5 mg via RESPIRATORY_TRACT
  Filled 2017-12-06: qty 3

## 2017-12-06 MED ORDER — SODIUM CHLORIDE 0.9% IV SOLUTION
Freq: Once | INTRAVENOUS | Status: AC
  Administered 2017-12-06: 05:00:00 via INTRAVENOUS

## 2017-12-06 MED ORDER — ALBUMIN HUMAN 25 % IV SOLN
INTRAVENOUS | Status: AC
  Administered 2017-12-06: 25 g via INTRAVENOUS
  Filled 2017-12-06: qty 100

## 2017-12-06 MED ORDER — FENTANYL CITRATE (PF) 100 MCG/2ML IJ SOLN
12.5000 ug | INTRAMUSCULAR | Status: DC | PRN
  Administered 2017-12-07 – 2017-12-10 (×4): 12.5 ug via INTRAVENOUS
  Filled 2017-12-06 (×3): qty 2

## 2017-12-06 MED ORDER — OXYCODONE HCL 5 MG PO TABS
5.0000 mg | ORAL_TABLET | Freq: Three times a day (TID) | ORAL | Status: DC
  Administered 2017-12-06 – 2017-12-11 (×16): 5 mg via ORAL
  Filled 2017-12-06 (×15): qty 1

## 2017-12-06 NOTE — Procedures (Signed)
I was present at this dialysis session. I have reviewed the session itself and made appropriate changes.   Vital signs in last 24 hours:  Temp:  [97.3 F (36.3 C)-98.5 F (36.9 C)] 98.4 F (36.9 C) (11/12 0705) Pulse Rate:  [64-100] 72 (11/12 0830) Resp:  [14-32] 21 (11/12 0705) BP: (50-135)/(29-121) 128/59 (11/12 0830) SpO2:  [84 %-100 %] 95 % (11/12 0600) Weight:  [86.2 kg] 86.2 kg (11/11 1422) Weight change:  Filed Weights   12/05/17 1422  Weight: 86.2 kg    Recent Labs  Lab 12/06/17 0740  NA 131*  K 5.2*  CL 97*  CO2 21*  GLUCOSE 181*  BUN 81*  CREATININE 3.76*  CALCIUM 8.0*  PHOS 4.0    Recent Labs  Lab 12/05/17 1449 12/05/17 1507 12/06/17 0313 12/06/17 0422  WBC 13.1*  --   --  18.1*  NEUTROABS 9.3*  --   --   --   HGB 5.1* 5.4* 6.9* 7.1*  HCT 17.4* 16.0* 22.2* 22.8*  MCV 100.0  --   --  95.4  PLT 344  --   --  289    Scheduled Meds: . Chlorhexidine Gluconate Cloth  6 each Topical Q0600  . insulin aspart  0-15 Units Subcutaneous Q4H  . pantoprazole (PROTONIX) IV  40 mg Intravenous Q12H  . patiromer  16.8 g Oral Once   Continuous Infusions: . sodium chloride 75 mL/hr at 12/06/17 0300  . albumin human     PRN Meds:.morphine injection, ondansetron (ZOFRAN) IV    Assessment and plan: 1. Symptomatic anemia from GIB s/p 3 units PRBC's awaiting repeat Hgb today. 2. ESRD- seen on HD and c/o leg pain otherwise tolerating well with IV albumin 3. HTN- low BP's stable with albumin 4. Hyperkalemia- improved with veltassa now on HD 5. Vascular access- poorly functioning RIJ tdc will use activase to dwell after HD. 6. PVD- s/p RBKA, ulcers on left leg and c/o pain and wants oxycodone as morphine does not help.  Will resume outpatient meds.  Will need wound care for his dressing changes.  Donetta Potts,  MD 12/06/2017, 8:50 AM

## 2017-12-06 NOTE — Progress Notes (Signed)
CRITICAL VALUE ALERT  Critical Value:  Troponin   Date & Time Notied: 12/06/2017 02:13   Provider Notified: Georgann Housekeeper  Orders Received/Actions taken: Waiting for orders

## 2017-12-06 NOTE — Procedures (Signed)
Central Venous Catheter Insertion Procedure Note Mitchell Garcia [id number] 07-08-1945  Procedure: Insertion of Central Venous Catheter Indications: Assessment of intravascular volume, Drug and/or fluid administration and Frequent blood sampling  Procedure Details Consent: Risks of procedure as well as the alternatives and risks of each were explained to the (patient/caregiver).  Consent for procedure obtained. Time Out: Verified patient identification, verified procedure, site/side was marked, verified correct patient position, special equipment/implants available, medications/allergies/relevent history reviewed, required imaging and test results available.  Performed  Maximum sterile technique was used including antiseptics, cap, gloves, gown, hand hygiene, mask and sheet. Skin prep: Chlorhexidine; local anesthetic administered A antimicrobial bonded/coated triple lumen catheter was placed in the left internal jugular vein using the Seldinger technique. Ultrasound guidance used.Yes.   Catheter placed to 20 cm. Blood aspirated via all 3 ports and then flushed x 3. Line sutured x 2 and dressing applied.  Evaluation Blood flow good Complications: No apparent complications Patient did tolerate procedure well. Chest X-ray ordered to verify placement.  CXR: normal.   Georgann Housekeeper, AGACNP-BC [place] Pager [phone] or [phone]  12/06/2017 6:49 AM

## 2017-12-06 NOTE — Progress Notes (Signed)
CRITICAL VALUE ALERT  Critical Value: Troponin   Date & Time Notied: 12/06/2017 05:32   Provider Notified: Warren Lacy RN   Orders Received/Actions taken: RN will notify MD. Waiting for orders.

## 2017-12-06 NOTE — Consult Note (Addendum)
[facility] Nurse wound consult note Reason for Consult: Consult requested for left leg and right stump.  Pt is followed at an outpatient wound care center in [place] and they have ordered Santyl for left leg and perform serial debridements, according to patient's wife at the bedside.  Wound type: Left posterior calf with chronic full thickness wound; 15X6.5X.3cm, 50% yellow slough, 30% red, 10% eschar near the top of the wound. Large amt green drainage and strong odor.  Right stump with full thickness wound;  10% yellow slough, 90% red, Large amt green drainage and strong odor, 3X2X.3cm.  Dressing procedure/placement/frequency: Continue present plan of care to left leg as was the previous plan of care; Bedside nurse; change left leg dressing Q day as follows:  Cleanse with NS and pat dry, then apply thick layer of Santyl to moist gauze, then cover with 2 ABD pads.  Apply a half-roll of cotton wrap, from ankle to below knee in a spiral fashion, then ace wrap in the same manner.  Aquacel to right stump to absorb drainage and provide antimicrobial benefits.  Pt can resume follow up with his previous physician after discharge. Topical treatment orders provided for bedside nurses to perform. Please re-consult if further assistance is needed.  Thank-you,  Julien Girt MSN, [place], [place], [place], [place]

## 2017-12-06 NOTE — Progress Notes (Signed)
Orthopedic Tech Progress Note Patient Details:  Mitchell Garcia 10/27/1945 [id number]  Ortho Devices Type of Ortho Device: Cotton web roll Ortho Device/Splint Interventions: Criss Alvine 12/06/2017, 2:08 PM

## 2017-12-06 NOTE — Progress Notes (Signed)
Pt's wife asked for financial assistance to keep the billing right. Stated she spoke with Social worker Education officer, community at [facility] who can be reached at [phone] ext [phone]. Wife told to make sure that the hospital billl is billed [facility] not [facility] - called financial counselor to help.

## 2017-12-06 NOTE — Progress Notes (Addendum)
[facility] Progress Note Patient Name: Mitchell Garcia DOB: 1945/06/08 MRN: [id number]   Date of Service  12/06/2017  HPI/Events of Note  K+ = 6.0. Patient got Insulin/D50 45 minutes ago. GI bleed, therefore, can't get Kayexalate. ESRD Hx.  eICU Interventions  Will order: 1. Calcium gluconate 1 gm IV now.         Sommer,Steven Eugene 12/06/2017, 3:50 AM

## 2017-12-06 NOTE — Progress Notes (Signed)
CRITICAL VALUE ALERT  Critical Value: HGB 6.9   Date & Time Notied: 12/06/2017 03:44  Provider Notified: Dr. Oletta Darter.   Orders Received/Actions taken: Transfuse 1 unit of RBC

## 2017-12-06 NOTE — Progress Notes (Signed)
NAME:  Mitchell Garcia, MRN:  [id number], DOB:  [birth year], LOS: 1 ADMISSION DATE:  12/05/2017, CONSULTATION DATE:  `11/11 REFERRING MD:  Darl Householder, CHIEF COMPLAINT:  gib   Brief History   72 year old male patient presents from skilled nursing facility with chief complaint abdominal pain, nausea and weakness with associated hypotension with systolic blood pressure in 60s, hemoglobin 5.1, has history of superficial duodenal ulcers noted on endoscopy July 2019.  Admitted with working diagnosis of acute blood loss anemia and GI bleed Past Medical History  Recent GI bleed with superficial duodenal ulcers (endoscopy on August 08, 2017), small hiatal hernia, end-stage renal disease TTS, history of coronary artery disease with prior drug-eluting stent (on Plavix), diabetes Significant Hospital Events   11/11 admitted with acute GI bleed, symptomatic anemia, hypovolemic shock.  Initial hemoglobin 5.1.  Received 2 units PRBCs, GI consulted, recommended CT imaging, PPI twice daily.  Nephrology consulted 11/12 hemoglobin now 8.7 (having received total 3 units PRBC since admission)tolerated dialysis.   Consults:  GI (magod) Renal (coadonato)  Procedures:  Left IJ triple-lumen catheter placed 11/12  Significant Diagnostic Tests:  CT abdomen 11/11: Small bilateral pleural effusions with basilar atelectasis.  Cardiomegaly.  Gallstones.  Negative for hydro-.  Nonobstructing stone in the left kidney.  Thick-walled appearing urinary bladder, possible cystitis.  17 mm low-density left adrenal mass.  Coarse calcification of the pancreatic head  Micro Data:  11/11 blood cultures x2  Antimicrobials:    Interim history/subjective:  That is post 3 units of blood since admission.  Objective   Blood pressure (Abnormal) 106/57, pulse 95, temperature 98.1 F (36.7 C), temperature source Oral, resp. rate 20, height 5\' 10"  (1.778 m), weight 84 kg, SpO2 97 %.        Intake/Output Summary (Last 24 hours) at 12/06/2017  1326 Last data filed at 12/06/2017 1122 Gross per 24 hour  Intake 1622.22 ml  Output 3506 ml  Net -1883.78 ml   Filed Weights   12/05/17 1422 12/06/17 0705 12/06/17 1122  Weight: 86.2 kg 87.4 kg 84 kg    Examination: General: Frail 72 year old white male resting in bed HENT: Normocephalic atraumatic no jugular venous distention Lungs: Clear diminished bases Cardiovascular: Regular rate Abdomen: Soft, tender to palpation no organomegaly Extremities: Left ankle/posterior leg pain dressing intact right stump pain noted. Neuro: Awake, oriented, recall poor, moves all extremities GU: An uric  Resolved Hospital Problem list   Hypovolemic shock Assessment & Plan:  Acute GI bleed  with associated acute blood loss anemia -Baseline hemoglobin about 8.4 to 8.5 -Has history of duodenal ulcers -Status post total 3 units PRBCs; hemoglobin currently 8.7 plan Serial CBC PPI twice daily Transfuse for hemoglobin less than 8 in setting of known coronary artery disease No anticoagulation GI considering repeat endoscopy on 11/13 Holding aspirin and Plavix  Hypotension: initially think this was a mix of hypovolemic/ hemorrhagic shock; blood pressure now currently in the 90s suspect not far from baseline as he is on chronic midodrine Plan MAP goal > 60 SBP > 90 PRN norepi  Transduce CVP (ensure we didn't pull off to much volume)  History coronary artery disease Elevated Trop I.  -Trop I slowly raising up to 0.89; Inverted ST and anterior lateral and inferior leads.  Right bundle branch block. -Could be element of demand ischemia Plan Obtain echocardiogram Continue telemetry monitoring Hemoglobin goal greater than 8  End-stage renal disease TTS Plan Nephrology following Follow-up serial chemistries  Left adrenal mass Plan Follow up imaging outpatient  Small bilateral pleural effusions with atelectasis Plan Supplemental oxygen as needed Follow-up x-ray a.m.  Fluid and  electrolyte balance: Per kalemia, hyponatremia Plan  Follow-up chemistry status post dialysis  mild leukocytosis: Possibly reactive Plan Trend CBC  Diabetes Glycemic control acceptable Plan Continue sliding scale insulin  Chronic lower extremity wounds: Left posterior calf with full-thickness wound, right stump with full-thickness wound Plan Routine wound care as directed by wound ostomy team PRN analgesia  abd pain: ? Gallstones vs Chronic pancreatitis  Plan PRN analgesia  Further eval per GI  Best practice:  Diet: npo Pain/Anxiety/Delirium protocol (if indicated): n/a VAP protocol (if indicated): n/a DVT prophylaxis: scd GI prophylaxis: IV PPI q12 Glucose control: ssi Mobility: BR Code Status: full coded ->DNR (re-confirmed) Family Communication: updated Disposition: keep In ICU for acute blood loss, hypotension, and on-going N/V   Critical care time: 33 min    Erick Colace ACNP-BC [place] Pager # [phone] OR # [phone] if no answer

## 2017-12-06 NOTE — Progress Notes (Signed)
Mitchell Garcia 10:27 AM  Subjective: Patient doing well without any new complaints wants to eat no signs of obvious bleeding feeling better after transfusion  Objective: Stable afebrile no acute distress abdomen is soft nontender with increased status post transfusion CT reviewed multiple little findings including gallstones possible playing a role with his pain  Assessment: Medical problems  Plan: We discussed repeat endoscopy and possibly an enteroscope for his pain to reevaluate his ulcer seen on last endoscopy and looking for other bleeding sources we will proceed tomorrow morning further work-up and plans pending those findings  [facility] E  Pager [phone] After 5PM or if no answer call [phone]

## 2017-12-07 ENCOUNTER — Encounter (HOSPITAL_COMMUNITY)
Admission: EM | Disposition: A | Discharge status: Skilled Nursing Facility | Payer: Self-pay | Source: Home / Self Care | Attending: Family Medicine

## 2017-12-07 ENCOUNTER — Inpatient Hospital Stay (HOSPITAL_COMMUNITY): Payer: Medicare Other | Admitting: Certified Registered Nurse Anesthetist

## 2017-12-07 ENCOUNTER — Encounter (HOSPITAL_COMMUNITY): Payer: Self-pay | Admitting: *Deleted

## 2017-12-07 ENCOUNTER — Other Ambulatory Visit (HOSPITAL_COMMUNITY): Payer: Non-veteran care

## 2017-12-07 HISTORY — PX: ESOPHAGOGASTRODUODENOSCOPY (EGD) WITH PROPOFOL: SHX5813

## 2017-12-07 LAB — BPAM RBC
BLOOD PRODUCT EXPIRATION DATE: 201912022359
Blood Product Expiration Date: 201912022359
Blood Product Expiration Date: 201912052359
ISSUE DATE / TIME: 201911111747
ISSUE DATE / TIME: 201911112231
ISSUE DATE / TIME: 201911120446
UNIT TYPE AND RH: 6200
UNIT TYPE AND RH: 6200
Unit Type and Rh: 6200

## 2017-12-07 LAB — TYPE AND SCREEN
ABO/RH(D): A POS
ANTIBODY SCREEN: NEGATIVE
UNIT DIVISION: 0
Unit division: 0
Unit division: 0

## 2017-12-07 LAB — GLUCOSE, CAPILLARY
GLUCOSE-CAPILLARY: 139 mg/dL — AB (ref 70–99)
GLUCOSE-CAPILLARY: 191 mg/dL — AB (ref 70–99)
GLUCOSE-CAPILLARY: 199 mg/dL — AB (ref 70–99)
GLUCOSE-CAPILLARY: 261 mg/dL — AB (ref 70–99)
Glucose-Capillary: 95 mg/dL (ref 70–99)
Glucose-Capillary: 226 mg/dL — ABNORMAL HIGH (ref 70–99)

## 2017-12-07 LAB — CBC
HCT: 24 % — ABNORMAL LOW (ref 39.0–52.0)
HCT: 25.4 % — ABNORMAL LOW (ref 39.0–52.0)
HEMOGLOBIN: 7.6 g/dL — AB (ref 13.0–17.0)
Hemoglobin: 7.4 g/dL — ABNORMAL LOW (ref 13.0–17.0)
MCH: 30 pg (ref 26.0–34.0)
MCH: 29.7 pg (ref 26.0–34.0)
MCHC: 30.8 g/dL (ref 30.0–36.0)
MCHC: 29.9 g/dL — ABNORMAL LOW (ref 30.0–36.0)
MCV: 97.2 fL (ref 80.0–100.0)
MCV: 99.2 fL (ref 80.0–100.0)
NRBC: 0.2 % (ref 0.0–0.2)
NRBC: 0.1 % (ref 0.0–0.2)
PLATELETS: 215 10*3/uL (ref 150–400)
PLATELETS: 213 10*3/uL (ref 150–400)
RBC: 2.47 MIL/uL — ABNORMAL LOW (ref 4.22–5.81)
RBC: 2.56 MIL/uL — AB (ref 4.22–5.81)
RDW: 17.8 % — AB (ref 11.5–15.5)
RDW: 18.2 % — ABNORMAL HIGH (ref 11.5–15.5)
WBC: 14 10*3/uL — ABNORMAL HIGH (ref 4.0–10.5)
WBC: 14.6 10*3/uL — ABNORMAL HIGH (ref 4.0–10.5)

## 2017-12-07 LAB — COMPREHENSIVE METABOLIC PANEL
ALT: 19 U/L (ref 0–44)
ANION GAP: 10 (ref 5–15)
AST: 22 U/L (ref 15–41)
Albumin: 2.6 g/dL — ABNORMAL LOW (ref 3.5–5.0)
Alkaline Phosphatase: 60 U/L (ref 38–126)
BUN: 56 mg/dL — ABNORMAL HIGH (ref 8–23)
CHLORIDE: 100 mmol/L (ref 98–111)
CO2: 23 mmol/L (ref 22–32)
Calcium: 8.4 mg/dL — ABNORMAL LOW (ref 8.9–10.3)
Creatinine, Ser: 3.66 mg/dL — ABNORMAL HIGH (ref 0.61–1.24)
GFR calc Af Amer: 17 mL/min — ABNORMAL LOW (ref >60)
GFR, EST NON AFRICAN AMERICAN: 15 mL/min — AB (ref >60)
Glucose, Bld: 188 mg/dL — ABNORMAL HIGH (ref 70–99)
POTASSIUM: 4.5 mmol/L (ref 3.5–5.1)
Sodium: 133 mmol/L — ABNORMAL LOW (ref 135–145)
Total Bilirubin: 0.4 mg/dL (ref 0.3–1.2)
Total Protein: 5.2 g/dL — ABNORMAL LOW (ref 6.5–8.1)

## 2017-12-07 LAB — RENAL FUNCTION PANEL
Albumin: 2.5 g/dL — ABNORMAL LOW (ref 3.5–5.0)
Anion gap: 8 (ref 5–15)
BUN: 53 mg/dL — ABNORMAL HIGH (ref 8–23)
CALCIUM: 7.8 mg/dL — AB (ref 8.9–10.3)
CHLORIDE: 103 mmol/L (ref 98–111)
CO2: 23 mmol/L (ref 22–32)
Creatinine, Ser: 3.44 mg/dL — ABNORMAL HIGH (ref 0.61–1.24)
GFR calc Af Amer: 19 mL/min — ABNORMAL LOW (ref >60)
GFR calc non Af Amer: 16 mL/min — ABNORMAL LOW (ref >60)
GLUCOSE: 184 mg/dL — AB (ref 70–99)
Phosphorus: 3.8 mg/dL (ref 2.5–4.6)
Potassium: 4.3 mmol/L (ref 3.5–5.1)
Sodium: 134 mmol/L — ABNORMAL LOW (ref 135–145)

## 2017-12-07 LAB — HEPATITIS B SURFACE ANTIGEN: Hepatitis B Surface Ag: NEGATIVE

## 2017-12-07 LAB — HEPATITIS B SURFACE ANTIBODY,QUALITATIVE: HEP B S AB: NONREACTIVE

## 2017-12-07 LAB — HEPATITIS B CORE ANTIBODY, TOTAL: HEP B C TOTAL AB: NEGATIVE

## 2017-12-07 SURGERY — ESOPHAGOGASTRODUODENOSCOPY (EGD) WITH PROPOFOL
Anesthesia: Monitor Anesthesia Care

## 2017-12-07 MED ORDER — PROPOFOL 500 MG/50ML IV EMUL
INTRAVENOUS | Status: DC | PRN
  Administered 2017-12-07: 125 ug/kg/min via INTRAVENOUS

## 2017-12-07 MED ORDER — PROPOFOL 10 MG/ML IV BOLUS
INTRAVENOUS | Status: DC | PRN
  Administered 2017-12-07 (×5): 10 mg via INTRAVENOUS

## 2017-12-07 MED ORDER — ACETAMINOPHEN 325 MG PO TABS
650.0000 mg | ORAL_TABLET | Freq: Four times a day (QID) | ORAL | Status: DC | PRN
  Administered 2017-12-07 – 2017-12-14 (×7): 650 mg via ORAL
  Filled 2017-12-07 (×6): qty 2

## 2017-12-07 MED ORDER — SODIUM CHLORIDE 0.9 % IV SOLN: INTRAVENOUS | Status: DC

## 2017-12-07 MED ORDER — GABAPENTIN 300 MG PO CAPS
300.0000 mg | ORAL_CAPSULE | Freq: Every day | ORAL | Status: DC
  Administered 2017-12-07: 300 mg via ORAL
  Filled 2017-12-07 (×2): qty 1

## 2017-12-07 MED ORDER — PANTOPRAZOLE SODIUM 40 MG PO TBEC
40.0000 mg | DELAYED_RELEASE_TABLET | Freq: Two times a day (BID) | ORAL | Status: DC
  Administered 2017-12-07 – 2017-12-14 (×14): 40 mg via ORAL
  Filled 2017-12-07 (×15): qty 1

## 2017-12-07 MED ORDER — LIDOCAINE HCL (CARDIAC) PF 100 MG/5ML IV SOSY
PREFILLED_SYRINGE | INTRAVENOUS | Status: DC | PRN
  Administered 2017-12-07: 40 mg via INTRAVENOUS

## 2017-12-07 MED ORDER — MIDODRINE HCL 5 MG PO TABS
5.0000 mg | ORAL_TABLET | Freq: Three times a day (TID) | ORAL | Status: DC
  Administered 2017-12-07 – 2017-12-14 (×23): 5 mg via ORAL
  Filled 2017-12-07 (×20): qty 1

## 2017-12-07 MED ORDER — INSULIN GLARGINE 100 UNIT/ML ~~LOC~~ SOLN
5.0000 [IU] | Freq: Every day | SUBCUTANEOUS | Status: DC
  Administered 2017-12-07 – 2017-12-13 (×7): 5 [IU] via SUBCUTANEOUS
  Filled 2017-12-07 (×8): qty 0.05

## 2017-12-07 SURGICAL SUPPLY — 15 items

## 2017-12-07 NOTE — Progress Notes (Signed)
S:More awake and alert today but still complaining of pain O:BP 120/70   Pulse 75   Temp 97.9 F (36.6 C) (Oral)   Resp (!) 24   Ht 5\' 10"  (1.778 m)   Wt 83.1 kg   SpO2 98%   BMI 26.29 kg/m   Intake/Output Summary (Last 24 hours) at 12/07/2017 1352 Last data filed at 12/07/2017 1203 Gross per 24 hour  Intake 2250 ml  Output 115 ml  Net 2135 ml   Intake/Output: I/O last 3 completed shifts: In: 3647.2 [I.V.:2576.3; Blood:1021; IV Piggyback:50] Out: 8527 [Urine:115; Other:3506]  Intake/Output this shift:  Total I/O In: 225 [I.V.:225] Out: -  Weight change: 1.216 kg Gen: NAD CVS: no rub Resp: occ rhonchi Abd: benign Ext: s/p RBKA, left leg with wrapping, RAVF +T/B  Recent Labs  Lab 12/05/17 1449 12/05/17 1507 12/05/17 2338 12/06/17 0422 12/06/17 0740 12/07/17 0346  NA 131* 129* 132* 133* 131* 133*  134*  K 5.5* 5.5* 6.0* 5.5* 5.2* 4.5  4.3  CL 95* 97* 98 99 97* 100  103  CO2 25  --  19* 23 21* 23  23  GLUCOSE 189* 186* 202* 205* 181* 188*  184*  BUN 88* 89* 89* 95* 81* 56*  53*  CREATININE 4.03* 4.20* 4.13* 4.53* 3.76* 3.66*  3.44*  ALBUMIN 2.4*  --   --   --  2.5* 2.6*  2.5*  CALCIUM 7.7*  --  7.7* 8.0* 8.0* 8.4*  7.8*  PHOS  --   --   --  4.2 4.0 3.8  AST 24  --   --   --   --  22  ALT 22  --   --   --   --  19   Liver Function Tests: Recent Labs  Lab 12/05/17 1449 12/06/17 0740 12/07/17 0346  AST 24  --  22  ALT 22  --  19  ALKPHOS 69  --  60  BILITOT 0.3  --  0.4  PROT 5.3*  --  5.2*  ALBUMIN 2.4* 2.5* 2.6*  2.5*   Recent Labs  Lab 12/05/17 1449  LIPASE 19   No results for input(s): AMMONIA in the last 168 hours. CBC: Recent Labs  Lab 12/05/17 1449  12/06/17 0422 12/06/17 0848 12/07/17 0346 12/07/17 1321  WBC 13.1*  --  18.1* 16.9* 14.0* 14.6*  NEUTROABS 9.3*  --   --   --   --   --   HGB 5.1*   < > 7.1* 8.7* 7.4* 7.6*  HCT 17.4*   < > 22.8* 28.7* 24.0* 25.4*  MCV 100.0  --  95.4 96.3 97.2 99.2  PLT 344  --  289 309 215  213   < > = values in this interval not displayed.   Cardiac Enzymes: Recent Labs  Lab 12/05/17 2338 12/06/17 0422 12/06/17 1432  TROPONINI 0.74* 0.89* 1.25*   CBG: Recent Labs  Lab 12/06/17 1957 12/06/17 2357 12/07/17 0402 12/07/17 0753 12/07/17 1301  GLUCAP 166* 261* 199* 95 139*    Iron Studies: No results for input(s): IRON, TIBC, TRANSFERRIN, FERRITIN in the last 72 hours. Studies/Results: Dg Chest Port 1 View  Result Date: 12/06/2017 CLINICAL DATA:  Shortness of breath EXAM: PORTABLE CHEST 1 VIEW COMPARISON:  12/05/2017 FINDINGS: Cardiac shadow remains enlarged. Bilateral jugular catheters are again noted and stable. Bilateral pleural effusions right greater than left are again noted and stable. Patchy infiltrate is noted in the bases bilaterally slightly increased when compared with  the prior exam. No pneumothorax is noted. No bony abnormality is seen. IMPRESSION: Stable pleural effusions. Slight increase in bibasilar infiltrates. Electronically Signed   By: Inez Catalina M.D.   On: 12/06/2017 07:06   Dg Chest Port 1 View  Result Date: 12/05/2017 CLINICAL DATA:  Central line placement EXAM: PORTABLE CHEST 1 VIEW COMPARISON:  12/05/2017, 08/05/2017 FINDINGS: Right-sided central venous catheter tip over the SVC. Left IJ central venous catheter tip over the SVC. No left pneumothorax cardiomegaly with bilateral pleural effusions and bibasilar airspace disease. Vascular congestion with continued pulmonary edema. Aortic atherosclerosis. IMPRESSION: 1. Left IJ central venous catheter tip superimposes the SVC. There is no left pneumothorax 2. Continued cardiomegaly with vascular congestion, mild pulmonary edema and left greater than right pleural effusions. Effusion on the right appears slightly increased. Electronically Signed   By: Donavan Foil M.D.   On: 12/05/2017 23:47   Dg Chest Portable 1 View  Result Date: 12/05/2017 CLINICAL DATA:  Left leg edema. EXAM: PORTABLE CHEST 1  VIEW COMPARISON:  Radiograph August 05, 2017. FINDINGS: Stable cardiomegaly. Atherosclerosis of thoracic aorta is noted. Right internal jugular dialysis catheter is unchanged in position. No pneumothorax is noted. Mild bibasilar subsegmental atelectasis is noted with bilateral pleural effusions, left greater than right. Bony thorax is unremarkable. IMPRESSION: Mild bibasilar subsegmental atelectasis is noted with bilateral pleural effusions, left greater than right. Aortic Atherosclerosis (ICD10-I70.0). Electronically Signed   By: Marijo Conception, M.D.   On: 12/05/2017 15:30   Ct Renal Stone Study  Result Date: 12/05/2017 CLINICAL DATA:  Abdominal pain nausea vomiting and lethargy EXAM: CT ABDOMEN AND PELVIS WITHOUT CONTRAST TECHNIQUE: Multidetector CT imaging of the abdomen and pelvis was performed following the standard protocol without IV contrast. COMPARISON:  None. FINDINGS: Lower chest: Small left pleural effusion. Small moderate right pleural effusion. Mild consolidations at both bases may reflect atelectasis or pneumonia. Cardiomegaly with trace pericardial effusion. Small gas bubbles in the right atrium. Extensive coronary vascular calcification. Hepatobiliary: Gallstones. No focal hepatic abnormality or biliary dilatation. Pancreas: Coarse calcifications at the head and uncinate process of the pancreas likely reflecting prior pancreatitis. No acute inflammatory changes. Spleen: Normal in size without focal abnormality. Adrenals/Urinary Tract: Nodularity of the left adrenal gland with 17 mm low-density mass incompletely characterized. Kidneys show no hydronephrosis. 3 mm stone mid left kidney. Intrarenal vascular calcification. Punctate stones in the lower pole of the left kidney. Mildly thick-walled appearance of the bladder. Stomach/Bowel: Stomach nonenlarged. No dilated small bowel. No colon wall thickening. Negative appendix. Vascular/Lymphatic: Extensive aortic atherosclerosis. No aneurysm. No  significantly enlarged lymph nodes. Reproductive: Coarse prostate calcifications. Other: No free air or free fluid. Musculoskeletal: Degenerative changes. No acute or suspicious abnormality. IMPRESSION: 1. Bilateral pleural effusions, small on the left and small moderate on the right. Partial consolidations in the lower lobes may reflect atelectasis or pneumonia. 2. Cardiomegaly. Small foci of air in the right atrium suspected to be secondary to peripheral line placement 3. Gallstones 4. Negative for hydronephrosis or ureteral stone. Nonobstructing stones in the left kidney. 5. Thick-walled appearance of the urinary bladder, possible cystitis 6. 17 mm low-density left adrenal mass incompletely characterized without contrast. Could perform nonemergent adrenal CT to better evaluate. 7. Coarse calcifications in the region of the pancreatic head and uncinate process likely reflecting chronic pancreatitis. Electronically Signed   By: Donavan Foil M.D.   On: 12/05/2017 17:55   . Chlorhexidine Gluconate Cloth  6 each Topical Q0600  . collagenase   Topical Daily  .  gabapentin  300 mg Oral QHS  . insulin aspart  0-15 Units Subcutaneous Q4H  . insulin glargine  5 Units Subcutaneous QHS  . midodrine  5 mg Oral TID WC  . oxyCODONE  5 mg Oral Q8H  . pantoprazole (PROTONIX) IV  40 mg Intravenous Q12H    BMET    Component Value Date/Time   NA 134 (L) 12/07/2017 0346   NA 133 (L) 12/07/2017 0346   NA 144 11/01/2016 1532   K 4.3 12/07/2017 0346   K 4.5 12/07/2017 0346   CL 103 12/07/2017 0346   CL 100 12/07/2017 0346   CO2 23 12/07/2017 0346   CO2 23 12/07/2017 0346   GLUCOSE 184 (H) 12/07/2017 0346   GLUCOSE 188 (H) 12/07/2017 0346   BUN 53 (H) 12/07/2017 0346   BUN 56 (H) 12/07/2017 0346   BUN 66 (H) 11/01/2016 1532   CREATININE 3.44 (H) 12/07/2017 0346   CREATININE 3.66 (H) 12/07/2017 0346   CALCIUM 7.8 (L) 12/07/2017 0346   CALCIUM 8.4 (L) 12/07/2017 0346   CALCIUM 8.2 (L) 05/19/2017 0230    GFRNONAA 16 (L) 12/07/2017 0346   GFRNONAA 15 (L) 12/07/2017 0346   GFRAA 19 (L) 12/07/2017 0346   GFRAA 17 (L) 12/07/2017 0346   CBC    Component Value Date/Time   WBC 14.6 (H) 12/07/2017 1321   RBC 2.56 (L) 12/07/2017 1321   HGB 7.6 (L) 12/07/2017 1321   HGB 8.7 (L) 11/01/2016 1532   HCT 25.4 (L) 12/07/2017 1321   HCT 26.6 (L) 11/01/2016 1532   PLT 213 12/07/2017 1321   PLT 328 11/01/2016 1532   MCV 99.2 12/07/2017 1321   MCV 92 11/01/2016 1532   MCH 29.7 12/07/2017 1321   MCHC 29.9 (L) 12/07/2017 1321   RDW 18.2 (H) 12/07/2017 1321   RDW 15.3 11/01/2016 1532   LYMPHSABS 1.9 12/05/2017 1449   MONOABS 1.7 (H) 12/05/2017 1449   EOSABS 0.1 12/05/2017 1449   BASOSABS 0.0 12/05/2017 1449   Outpatient HD Prescription [facility] [phone] Extension [id number] Charge RN) TTS 3K2.5 Ca 138Na 3hrs 45 min 400/800 37 temp EDW 97 kg (well below edw here) "Prn challenge by 0.5 as tolerated" TDC 1.6 ml hep dwell  3000 bolus heparin, no maintenance 2 mcg Hectorol 2 mcg TTS    Assessment/Plan: 72 year old white male with multiple medical problems including ESRD and previous GI bleed, now presenting with a hemoglobin of 5.1and GI sxms 1. Severe anemia/GIB-do not know what recent hemoglobin has been.  Feature of hospitalization in July was GI bleed status post scopes showing ulcers, polyps and an oozing AVM.  GI has already been consulted and s/p EGD which revealed non-bleeding duodenal ulcer.  Received 2 units for hemoglobin 5.1.  Reportedly on Plavix as OP 2. ESRD: Normally TTS at the [facility].  Using PermCath but AV fistula is close for use.  Will continue with outpatient schedule-no heparin.    3. Hypertension: Was hypotensive in the emergency department but improved after blood.  Home medication list includes Coreg and BiDil but also midodrine?  4. Anemia of ESRD:  Appropriate response following transfusions.  Will attempt to find out when ESA was dosed at outpatient  kidney center but likely will re-dose here 5. Metabolic Bone Disease: Medication list includes Sensipar 30 daily and Renvela.  Will resume once GI issues resolved 6. Psych- pt seems very depressed asking "is this all worth it"  Stating that 4 hours of dialysis is torture, makes him feel  terrible- consider a palliative care consult while in house   Donetta Potts, MD [employer] [phone]

## 2017-12-07 NOTE — Anesthesia Preprocedure Evaluation (Signed)
Anesthesia Evaluation  Patient identified by MRN, date of birth, ID band Patient awake    Reviewed: Allergy & Precautions, NPO status , Patient's Chart, lab work & pertinent test results, reviewed documented beta blocker date and time   Airway Mallampati: II  TM Distance: >3 FB Neck ROM: Full    Dental no notable dental hx. (+) Teeth Intact   Pulmonary neg pulmonary ROS,    Pulmonary exam normal breath sounds clear to auscultation       Cardiovascular hypertension, Pt. on medications and Pt. on home beta blockers + CAD, + Past MI, + Cardiac Stents and +CHF  Normal cardiovascular exam Rhythm:Regular Rate:Normal  Non STEMI 05/18/2017 Stent x 2 9/14 LAD prox 70% and prox RCA  Echo 07/09/17 showing LVEF 40-45%. Cath 09/2016-Left Holli Humbles is large. Vessel is angiographically normal. Left Anterior Descending Prox LAD lesion 50% stenosed Dist LAD lesion 20% stenosed First Diagonal Branch Vessel is moderate in size. 1st Diag lesion 70% stenosed Left CircumflexVessel is moderate in size. Mid Cx lesion 100% stenosed The lesion is chronically occluded with left-to-left collateral flow. First Obtuse Marginal BranchVessel is moderate in size. Right Coronary Artery Vessel is large. Ost RCA lesion 80% stenosed The lesion is eccentric. The lesion is moderately calcified. Ostial stenosis with pressure dampening a 65F diagnostic catheter. Mid RCA lesion 30% stenosed The lesion is irregular. Dist RCA lesion 95% stenosed Dist RCA lesion. Right Posterior Descending Artery Vessel is large in size. Right Posterior Atrioventricular Branch Vessel is large in size.     Neuro/Psych CVA, Residual Symptoms negative psych ROS   GI/Hepatic Gi Bleed   Endo/Other  diabetes, Well Controlled, Type 2, Oral Hypoglycemic Agents  Renal/GU ARF, Renal Insufficiency, Dialysis and ESRFRenal diseaseLast dialysis on Saturday 7/13      Musculoskeletal negative musculoskeletal ROS (+)   Abdominal   Peds  Hematology  (+) anemia , Plavix- last dose   Anesthesia Other Findings   Reproductive/Obstetrics                             Anesthesia Physical  Anesthesia Plan  ASA: III  Anesthesia Plan: MAC   Post-op Pain Management:    Induction: Intravenous  PONV Risk Score and Plan: 1 and Propofol infusion, Ondansetron and Treatment may vary due to age or medical condition  Airway Management Planned: Natural Airway and Nasal Cannula  Additional Equipment:   Intra-op Plan:   Post-operative Plan:   Informed Consent: I have reviewed the patients History and Physical, chart, labs and discussed the procedure including the risks, benefits and alternatives for the proposed anesthesia with the patient or authorized representative who has indicated his/her understanding and acceptance.   Dental advisory given  Plan Discussed with: CRNA and Surgeon  Anesthesia Plan Comments:         Anesthesia Quick Evaluation

## 2017-12-07 NOTE — Progress Notes (Addendum)
NAME:  GEOVANNIE VILAR, MRN:  [id number], DOB:  1945/06/28, LOS: 2 ADMISSION DATE:  12/05/2017, CONSULTATION DATE:  `11/11 REFERRING MD:  Darl Householder, CHIEF COMPLAINT:  gib   Brief History   72 year old male patient presents from skilled nursing facility with chief complaint abdominal pain, nausea and weakness with associated hypotension with systolic blood pressure in 60s, hemoglobin 5.1, has history of superficial duodenal ulcers noted on endoscopy July 2019.  Admitted with working diagnosis of acute blood loss anemia and GI bleed Past Medical History  Recent GI bleed with superficial duodenal ulcers (endoscopy on August 08, 2017), small hiatal hernia, end-stage renal disease TTS, history of coronary artery disease with prior drug-eluting stent (on Plavix), diabetes Significant Hospital Events   11/11 admitted with acute GI bleed, symptomatic anemia, hypovolemic shock.  Initial hemoglobin 5.1.  Received 2 units PRBCs, GI consulted, recommended CT imaging, PPI twice daily.  Nephrology consulted 11/12 hemoglobin now 8.7 (having received total 3 units PRBC since admission)tolerated dialysis.   11/13.  Hemoglobin down to 7.3.  Otherwise stable.  Endoscopy scheduled Consults:  GI (magod) Renal (coadonato)  Procedures:  Left IJ triple-lumen catheter placed 11/12  Significant Diagnostic Tests:  CT abdomen 11/11: Small bilateral pleural effusions with basilar atelectasis.  Cardiomegaly.  Gallstones.  Negative for hydro-.  Nonobstructing stone in the left kidney.  Thick-walled appearing urinary bladder, possible cystitis.  17 mm low-density left adrenal mass.  Coarse calcification of the pancreatic head  Micro Data:  11/11 blood cultures x2  Antimicrobials:    Interim history/subjective:  No abdominal pain today Complains of leg pain which is chronic  Objective   Blood pressure 102/62, pulse 70, temperature 98.1 F (36.7 C), temperature source Oral, resp. rate 16, height 5\' 10"  (1.778 m), weight  83.1 kg, SpO2 92 %. CVP:  [10 mmHg-12 mmHg] 12 mmHg      Intake/Output Summary (Last 24 hours) at 12/07/2017 1058 Last data filed at 12/07/2017 0800 Gross per 24 hour  Intake 2100 ml  Output 3621 ml  Net -1521 ml   Filed Weights   12/06/17 0705 12/06/17 1122 12/07/17 0349  Weight: 87.4 kg 84 kg 83.1 kg    Examination: General: Chronically ill-appearing white male currently resting in bed he is very hard of hearing slightly encephalopathic HEENT: Normocephalic atraumatic mucous membranes moist Pulmonary: Diminished bases no accessory use Cardiac: Regular rate and rhythm Abdomen: Soft, not tender GU: Anuric Neuro: Awake, moves all extremities, follows commands, slightly.  Recall poor, does not remember yesterday's conversation Extremities: Warm, dry.  He has stump dressing on right, and left lower extremity dressing both intact continues to complain of pain  Resolved Hospital Problem list   Hypovolemic shock Assessment & Plan:  Acute GI bleed  with associated acute blood loss anemia -Baseline hemoglobin about 8.4 to 8.5, looks like he left the hospital in July around 7.5 -Has history of duodenal ulcers -Status post total 3 units PRBCs; hemoglobin currently as high as 8.7 now back to 7.4 plan For endo currently F/u CBC at noon, if hgb < 7 transfuse Cont PPI Further recs per GI  Hypotension: initially think this was a mix of hypovolemic/ hemorrhagic shock; blood pressure now currently in the 90s suspect not far from baseline as he is on chronic midodrine Plan MAP goal > 60 SBP goal > 90 KVO IVFs Resume midodrine after endo  History coronary artery disease Elevated Trop I.  -Trop I slowly raising up to 0.89; Inverted ST and anterior lateral and inferior  leads.  Right bundle branch block. -Could be element of demand ischemia Plan F/u echo (pending) Cont tele  No code if he arrests  End-stage renal disease TTS Plan Serial chemistries  Left adrenal mass Plan F/u  as out-pt   Small bilateral pleural effusions with atelectasis Plan Pulse ox   Fluid and electrolyte balance: hyperkalemia, hyponatremia Plan  Am chemistry   mild leukocytosis: Possibly reactive->trending down Plan Trend cbc   Diabetes Glycemic control acceptable Plan ssi  Mild metabolic encephalopathy. May also be exacerbated by narcotics Plan Supportive  Care   Chronic lower extremity wounds: Left posterior calf with full-thickness wound, right stump with full-thickness wound Plan Cont current analgesia and wound care   abd pain: ? Gallstones vs Chronic pancreatitis  Plan Cont PRN analgesia  Further recs per GI  Best practice:  Diet: npo Pain/Anxiety/Delirium protocol (if indicated): n/a VAP protocol (if indicated): n/a DVT prophylaxis: scd GI prophylaxis: IV PPI q12 Glucose control: ssi Mobility: BR Code Status: full coded ->DNR (re-confirmed) Family Communication: updated Disposition: keep In ICU for acute blood loss, hypotension, and on-going N/V   Critical care time: [place] ACNP-BC [place] Pager # [phone] OR # [phone] if no answer

## 2017-12-07 NOTE — Consult Note (Signed)
   [facility] CM Inpatient Consult   12/07/2017  Mitchell Garcia 1945/09/22 [id number]   Patient screened for potential [facility] Care Management services due to unplanned readmission risk score of 31% (extreme).  Chart reviewed. Mr. Kwong Primary Care MD is with the [place]. Patient not currently eligible for [facility] Care Management at this time due to PCP not being a [facility] Provider.    Marthenia Rolling, MSN-Ed, RN,BSN [facility] Liaison [phone]

## 2017-12-07 NOTE — Anesthesia Postprocedure Evaluation (Signed)
Anesthesia Post Note  Patient: Mitchell Garcia  Procedure(s) Performed: ESOPHAGOGASTRODUODENOSCOPY (EGD) WITH PROPOFOL (N/A )     Patient location during evaluation: PACU Anesthesia Type: MAC Level of consciousness: awake and alert Pain management: pain level controlled Vital Signs Assessment: post-procedure vital signs reviewed and stable Respiratory status: spontaneous breathing, nonlabored ventilation and respiratory function stable Cardiovascular status: stable and blood pressure returned to baseline Postop Assessment: no apparent nausea or vomiting Anesthetic complications: no    Last Vitals:  Vitals:   12/07/17 1213 12/07/17 1223  BP: 100/71 (!) 119/44  Pulse: 75   Resp: (!) 26 (!) 21  Temp: 36.6 C   SpO2: 95% 94%    Last Pain:  Vitals:   12/07/17 1223  TempSrc:   PainSc: [facility]

## 2017-12-07 NOTE — Transfer of Care (Signed)
Immediate Anesthesia Transfer of Care Note  Patient: Mitchell Garcia  Procedure(s) Performed: ESOPHAGOGASTRODUODENOSCOPY (EGD) WITH PROPOFOL (N/A )  Patient Location: PACU  Anesthesia Type:MAC  Level of Consciousness: awake, alert , oriented and patient cooperative  Airway & Oxygen Therapy: Patient Spontanous Breathing  Post-op Assessment: Report given to RN and Post -op Vital signs reviewed and stable  Post vital signs: Reviewed and stable  Last Vitals:  Vitals Value Taken Time  BP 100/71 12/07/2017 12:13 PM  Temp 36.6 C 12/07/2017 12:13 PM  Pulse 74 12/07/2017 12:14 PM  Resp 18 12/07/2017 12:14 PM  SpO2 94 % 12/07/2017 12:14 PM  Vitals shown include unvalidated device data.  Last Pain:  Vitals:   12/07/17 1213  TempSrc: Oral  PainSc: 6       Patients Stated Pain Goal: 1 (48/54/62 7035)  Complications: No apparent anesthesia complications

## 2017-12-07 NOTE — Op Note (Signed)
[facility] Patient Name: Mitchell Garcia Procedure Date : 12/07/2017 MRN: [id number] Attending MD: Clarene Essex , MD Date of Birth: [birth year] CSN: [id number] Age: 72 Admit Type: Inpatient Procedure:                Upper GI endoscopy Indications:              Iron deficiency anemia secondary to chronic blood                            loss, Follow-up of acute duodenal ulcer Providers:                Clarene Essex, MD, Cleda Daub, RN, Alan Mulder,                            Technician, Hedy Camara Referring MD:              Medicines:                Propofol total dose 323 mg IV Complications:            No immediate complications. Estimated Blood Loss:     Estimated blood loss: none. Procedure:                Pre-Anesthesia Assessment:                           - Prior to the procedure, a History and Physical                            was performed, and patient medications and                            allergies were reviewed. The patient's tolerance of                            previous anesthesia was also reviewed. The risks                            and benefits of the procedure and the sedation                            options and risks were discussed with the patient.                            All questions were answered, and informed consent                            was obtained. Prior Anticoagulants: The patient has                            taken no previous anticoagulant or antiplatelet                            agents. ASA Grade Assessment: III - A patient with  severe systemic disease. After reviewing the risks                            and benefits, the patient was deemed in                            satisfactory condition to undergo the procedure.                           After obtaining informed consent, the endoscope was                            passed under direct vision. Throughout the                             procedure, the patient's blood pressure, pulse, and                            oxygen saturations were monitored continuously. The                            GIF-H190 ([id number]) Olympus Adult EGD was introduced                            through the mouth, and advanced to the fourth part                            of duodenum. The PCF-PH190L ([id number]) Olympus Ultra                            Slim Colonoscope was introduced through the mouth,                            and advanced to the jejunum. The upper GI endoscopy                            was accomplished without difficulty. The patient                            tolerated the procedure well. Scope In: Scope Out: Findings:      The larynx was normal.      A tiny hiatal hernia was present.      The entire examined stomach was normal.      One non-bleeding cratered medium-sized duodenal ulcer with pigmented       material was found in the first portion of the duodenum.      The duodenal bulb, second portion of the duodenum, major papilla, third       portion of the duodenum and fourth portion of the duodenum were normal.      The examined jejunum was normal. We advanced 170 centimeters from the       mouth possibly even reached the proximal ileum without      The exam was otherwise without abnormality. Impression:               -  Normal larynx.                           - Tiny hiatal hernia.                           - Normal stomach.                           - One non-bleeding duodenal ulcer with pigmented                            material.                           - Normal duodenal bulb, second portion of the                            duodenum, major papilla, third portion of the                            duodenum and fourth portion of the duodenum.                           - Normal examined jejunum.                           - The examination was otherwise normal.                           - No specimens  collected. Recommendation:           - Patient has a contact number available for                            emergencies. The signs and symptoms of potential                            delayed complications were discussed with the                            patient. Return to normal activities tomorrow.                            Written discharge instructions were provided to the                            patient.                           - Soft diet today.                           - Continue present medications. He may be on                            aspirin and Plavix at home and will reevaluate his  aspirin use and I think when he goes to the [place] or                            [facility] hospital they may restart his medicines and                            I do not believe he was on pump inhibitors at home                            which is probably why his ulcer has not healed                           - Return to GI clinic PRN. Continue once a day pump                            inhibitor at home                           - Telephone GI clinic if symptomatic PRN. Procedure Code(s):        --- Professional ---                           [phone], Esophagogastroduodenoscopy, flexible,                            transoral; diagnostic, including collection of                            specimen(s) by brushing or washing, when performed                            (separate procedure) Diagnosis Code(s):        --- Professional ---                           K44.9, Diaphragmatic hernia without obstruction or                            gangrene                           K26.9, Duodenal ulcer, unspecified as acute or                            chronic, without hemorrhage or perforation                           D50.0, Iron deficiency anemia secondary to blood                            loss (chronic)                           K26.3, Acute duodenal ulcer without  hemorrhage or  perforation CPT copyright 2018 American Medical Association. All rights reserved. The codes documented in this report are preliminary and upon coder review may  be revised to meet current compliance requirements. Clarene Essex, MD 12/07/2017 12:15:36 PM This report has been signed electronically. Number of Addenda: 0

## 2017-12-07 NOTE — Progress Notes (Signed)
Mitchell Garcia 11:33 AM  Subjective: Patient doing much better and has no signs of bleeding no new complaints .his abdomen is better Objective: Signs stable afebrile no acute distress exam please see previous assessment evaluation BUN and creatinine improved hemoglobin stable  Assessment: Anemia guaiac positivity multiple medical problems history of ulcers  Plan: Okay to proceed with endoscopy and possible enteroscope with anesthesia assistance  [facility] E  Pager [phone] After 5PM or if no answer call [phone]

## 2017-12-08 ENCOUNTER — Inpatient Hospital Stay (HOSPITAL_COMMUNITY): Payer: Medicare Other

## 2017-12-08 ENCOUNTER — Other Ambulatory Visit (HOSPITAL_COMMUNITY): Payer: Non-veteran care

## 2017-12-08 DIAGNOSIS — I361 Nonrheumatic tricuspid (valve) insufficiency: Secondary | ICD-10-CM

## 2017-12-08 DIAGNOSIS — I2510 Atherosclerotic heart disease of native coronary artery without angina pectoris: Secondary | ICD-10-CM

## 2017-12-08 DIAGNOSIS — R7989 Other specified abnormal findings of blood chemistry: Secondary | ICD-10-CM

## 2017-12-08 DIAGNOSIS — D72828 Other elevated white blood cell count: Secondary | ICD-10-CM

## 2017-12-08 LAB — CBC
HCT: 25.8 % — ABNORMAL LOW (ref 39.0–52.0)
HEMATOCRIT: 25.5 % — AB (ref 39.0–52.0)
HEMOGLOBIN: 7.7 g/dL — AB (ref 13.0–17.0)
Hemoglobin: 7.6 g/dL — ABNORMAL LOW (ref 13.0–17.0)
MCH: 29.8 pg (ref 26.0–34.0)
MCH: 30.6 pg (ref 26.0–34.0)
MCHC: 29.5 g/dL — ABNORMAL LOW (ref 30.0–36.0)
MCHC: 30.2 g/dL (ref 30.0–36.0)
MCV: 101.2 fL — AB (ref 80.0–100.0)
MCV: 101.2 fL — AB (ref 80.0–100.0)
NRBC: 0.4 % — AB (ref 0.0–0.2)
PLATELETS: 232 10*3/uL (ref 150–400)
Platelets: 222 10*3/uL (ref 150–400)
RBC: 2.55 MIL/uL — AB (ref 4.22–5.81)
RBC: 2.52 MIL/uL — AB (ref 4.22–5.81)
RDW: 19 % — ABNORMAL HIGH (ref 11.5–15.5)
RDW: 18.9 % — ABNORMAL HIGH (ref 11.5–15.5)
WBC: 13.7 10*3/uL — ABNORMAL HIGH (ref 4.0–10.5)
WBC: 13.5 10*3/uL — ABNORMAL HIGH (ref 4.0–10.5)
nRBC: 0.3 % — ABNORMAL HIGH (ref 0.0–0.2)

## 2017-12-08 LAB — ECHOCARDIOGRAM COMPLETE
HEIGHTINCHES: 70 in
Weight: 3022.95 oz

## 2017-12-08 LAB — GLUCOSE, CAPILLARY
GLUCOSE-CAPILLARY: 144 mg/dL — AB (ref 70–99)
GLUCOSE-CAPILLARY: 79 mg/dL (ref 70–99)
GLUCOSE-CAPILLARY: 180 mg/dL — AB (ref 70–99)
GLUCOSE-CAPILLARY: 171 mg/dL — AB (ref 70–99)
GLUCOSE-CAPILLARY: 171 mg/dL — AB (ref 70–99)
Glucose-Capillary: 124 mg/dL — ABNORMAL HIGH (ref 70–99)
Glucose-Capillary: 103 mg/dL — ABNORMAL HIGH (ref 70–99)

## 2017-12-08 LAB — COMPREHENSIVE METABOLIC PANEL
ALT: 20 U/L (ref 0–44)
AST: 19 U/L (ref 15–41)
Albumin: 2.6 g/dL — ABNORMAL LOW (ref 3.5–5.0)
Alkaline Phosphatase: 65 U/L (ref 38–126)
Anion gap: 12 (ref 5–15)
BILIRUBIN TOTAL: 0.6 mg/dL (ref 0.3–1.2)
BUN: 64 mg/dL — ABNORMAL HIGH (ref 8–23)
CALCIUM: 8.2 mg/dL — AB (ref 8.9–10.3)
CO2: 21 mmol/L — ABNORMAL LOW (ref 22–32)
Chloride: 101 mmol/L (ref 98–111)
Creatinine, Ser: 4.33 mg/dL — ABNORMAL HIGH (ref 0.61–1.24)
GFR calc Af Amer: 14 mL/min — ABNORMAL LOW (ref >60)
GFR, EST NON AFRICAN AMERICAN: 12 mL/min — AB (ref >60)
Glucose, Bld: 151 mg/dL — ABNORMAL HIGH (ref 70–99)
POTASSIUM: 4.8 mmol/L (ref 3.5–5.1)
Sodium: 134 mmol/L — ABNORMAL LOW (ref 135–145)
TOTAL PROTEIN: 5.4 g/dL — AB (ref 6.5–8.1)

## 2017-12-08 LAB — RENAL FUNCTION PANEL
ALBUMIN: 2.6 g/dL — AB (ref 3.5–5.0)
ANION GAP: 9 (ref 5–15)
BUN: 64 mg/dL — ABNORMAL HIGH (ref 8–23)
CHLORIDE: 103 mmol/L (ref 98–111)
CO2: 23 mmol/L (ref 22–32)
Calcium: 8.2 mg/dL — ABNORMAL LOW (ref 8.9–10.3)
Creatinine, Ser: 4.24 mg/dL — ABNORMAL HIGH (ref 0.61–1.24)
GFR calc Af Amer: 15 mL/min — ABNORMAL LOW (ref >60)
GFR calc non Af Amer: 13 mL/min — ABNORMAL LOW (ref >60)
GLUCOSE: 123 mg/dL — AB (ref 70–99)
PHOSPHORUS: 6.4 mg/dL — AB (ref 2.5–4.6)
Potassium: 4.8 mmol/L (ref 3.5–5.1)
Sodium: 135 mmol/L (ref 135–145)

## 2017-12-08 LAB — TROPONIN I: TROPONIN I: 0.57 ng/mL — AB (ref <0.03)

## 2017-12-08 MED ORDER — VANCOMYCIN HCL 10 G IV SOLR
2000.0000 mg | Freq: Once | INTRAVENOUS | Status: AC
  Administered 2017-12-08: 2000 mg via INTRAVENOUS
  Filled 2017-12-08: qty 2000

## 2017-12-08 MED ORDER — HEPARIN SODIUM (PORCINE) 1000 UNIT/ML IJ SOLN
INTRAMUSCULAR | Status: AC
  Administered 2017-12-08: 1000 [IU]
  Filled 2017-12-08: qty 4

## 2017-12-08 MED ORDER — MIDODRINE HCL 5 MG PO TABS
ORAL_TABLET | ORAL | Status: AC
  Administered 2017-12-08: 5 mg via ORAL
  Filled 2017-12-08: qty 1

## 2017-12-08 MED ORDER — CARVEDILOL 3.125 MG PO TABS
3.1250 mg | ORAL_TABLET | Freq: Two times a day (BID) | ORAL | Status: DC
  Administered 2017-12-08 – 2017-12-09 (×2): 3.125 mg via ORAL
  Filled 2017-12-08 (×2): qty 1

## 2017-12-08 MED ORDER — PIPERACILLIN-TAZOBACTAM 3.375 G IVPB
3.3750 g | Freq: Two times a day (BID) | INTRAVENOUS | Status: DC
  Administered 2017-12-08 – 2017-12-10 (×4): 3.375 g via INTRAVENOUS
  Filled 2017-12-08 (×6): qty 50

## 2017-12-08 MED ORDER — GABAPENTIN 100 MG PO CAPS
100.0000 mg | ORAL_CAPSULE | Freq: Every day | ORAL | Status: DC
  Administered 2017-12-08 – 2017-12-13 (×6): 100 mg via ORAL
  Filled 2017-12-08 (×6): qty 1

## 2017-12-08 MED ORDER — VANCOMYCIN HCL IN DEXTROSE 1-5 GM/200ML-% IV SOLN
1000.0000 mg | INTRAVENOUS | Status: DC
  Administered 2017-12-10: 1000 mg via INTRAVENOUS
  Filled 2017-12-08: qty 200

## 2017-12-08 MED ORDER — ATORVASTATIN CALCIUM 20 MG PO TABS
20.0000 mg | ORAL_TABLET | Freq: Every day | ORAL | Status: DC
  Administered 2017-12-08 – 2017-12-14 (×7): 20 mg via ORAL
  Filled 2017-12-08 (×7): qty 1

## 2017-12-08 NOTE — Procedures (Signed)
I was present at this dialysis session. I have reviewed the session itself and made appropriate changes.  Mr. Thain complains of "my head feels elevated".  Per nursing he was a little confused this am and he did receive some pain meds before HD.  Currently hemodynamically stable.    Vital signs in last 24 hours:  Temp:  [97.5 F (36.4 C)-98.3 F (36.8 C)] (P) 97.5 F (36.4 C) (11/14 0830) Pulse Rate:  [25-78] (P) 64 (11/14 0830) Resp:  [14-26] (P) 20 (11/14 0830) BP: (56-142)/(44-120) (P) 115/64 (11/14 0830) SpO2:  [87 %-100 %] (P) 97 % (11/14 0830) Weight:  [87.7 kg-88.7 kg] (P) 87.7 kg (11/14 0830) Weight change: 1.3 kg Filed Weights   12/07/17 0349 12/08/17 0500 12/08/17 0830  Weight: 83.1 kg 88.7 kg (P) 87.7 kg    Recent Labs  Lab 12/07/17 0346 12/08/17 0449  NA 133*  134* 134*  K 4.5  4.3 4.8  CL 100  103 101  CO2 23  23 21*  GLUCOSE 188*  184* 151*  BUN 56*  53* 64*  CREATININE 3.66*  3.44* 4.33*  CALCIUM 8.4*  7.8* 8.2*  PHOS 3.8  --     Recent Labs  Lab 12/05/17 1449  12/07/17 0346 12/07/17 1321 12/08/17 0449  WBC 13.1*   < > 14.0* 14.6* 13.7*  NEUTROABS 9.3*  --   --   --   --   HGB 5.1*   < > 7.4* 7.6* 7.6*  HCT 17.4*   < > 24.0* 25.4* 25.8*  MCV 100.0   < > 97.2 99.2 101.2*  PLT 344   < > 215 213 232   < > = values in this interval not displayed.    Scheduled Meds: . Chlorhexidine Gluconate Cloth  6 each Topical Q0600  . collagenase   Topical Daily  . gabapentin  300 mg Oral QHS  . insulin aspart  0-15 Units Subcutaneous Q4H  . insulin glargine  5 Units Subcutaneous QHS  . midodrine  5 mg Oral TID WC  . oxyCODONE  5 mg Oral Q8H  . pantoprazole  40 mg Oral BID AC   Continuous Infusions: . sodium chloride Stopped (12/08/17 0815)  . norepinephrine (LEVOPHED) Adult infusion Stopped (12/06/17 1638)   PRN Meds:.acetaminophen, fentaNYL (SUBLIMAZE) injection, ondansetron (ZOFRAN) IV   Donetta Potts,  MD 12/08/2017, 8:52 AM

## 2017-12-08 NOTE — Progress Notes (Signed)
Received patient from day RN.   Patient screaming and shouting that his throat hurts.   When I asked the patient to explain his discomfort he stated,"  The doctor cut me in my throat..take this out or Im going to rip it out.  It doesn't belong.  My throat is cut."  The patient was referring to his left IJ TLC that he is going to rip out.  When I explained how everything worked he told me I dont know anything.Marland KitchenMarland KitchenGave patient 12.5 mcg fentanyl for pain.  Repositioned patient for comfort and elevated right stump.  Patient appears to be going in and out of confusion.  Unsure of baseline at this time.  Continuing to keep patient safe and monitor

## 2017-12-08 NOTE — Progress Notes (Signed)
Echocardiogram 2D Echocardiogram has been performed.  Mitchell Garcia 12/08/2017, 2:12 PM

## 2017-12-08 NOTE — Care Management Note (Addendum)
Case Management Note [name] RN,BSN Transitions of Care Unit 75M - RN Case Manager [phone]  Patient Details  Name: Mitchell Garcia MRN: [id number] Date of Birth: [birth year]  Subjective/Objective:    Pt admitted with GIB, ESRD on HD with Tx TTS at the [facility].              Action/Plan: PTA Pt lived at home with spouse- however was at [facility] for rehab, screened by [facility]- not currently eligible. Notified by April at [facility]- pt's primary care is at [facility] clinic- PCP-Dr. Ishmael Holter- CSW at the [facility] clinic is Drue Stager- contact # [phone] or [phone]. [phone] (fax [phone]) CM will follow for transition of care needs will notify CSW for placement needs  Expected Discharge Date:                  Expected Discharge Plan:     In-House Referral:     Discharge planning Services  CM Consult  Post Acute Care Choice:    Choice offered to:     DME Arranged:    DME Agency:     HH Arranged:    [place] Agency:     Status of Service:  In process, will continue to follow  If discussed at Long Length of Stay Meetings, dates discussed:    Discharge Disposition:   Additional Comments:  Dawayne Patricia, RN 12/08/2017, 12:46 PM

## 2017-12-08 NOTE — Progress Notes (Signed)
Mitchell Garcia 5:52 PM  Subjective: Patient seen and examined and discussed with hospital team and I was sad to repeat cardiology is noted about his need for aspirin and Plavix since I think is more likely to continue oozing from his GI tract if he remains on both of them but currently he is in better spirits and has no GI complaints  Objective: Vital signs stable afebrile no acute distress abdomen is soft nontender hemoglobin stable for now  Assessment: Duodenal ulcer renal failure probable AVMs Plan: Continue pump inhibitors long-term and please call us if we could be of any further assistance with this hospital stay and happy to see back in the office as an outpatient as needed  [facility] E  Pager [phone] After 5PM or if no answer call [phone]

## 2017-12-08 NOTE — Progress Notes (Signed)
[facility] PROGRESS NOTE  Mitchell Garcia MWU:[id number] DOB: 1945/03/17 DOA: 12/05/2017 PCP: [name], MD  Brief summary   72 year old male patient with PMH of CAD CABG, PCI stent 09/2016, HTN, ESRD on HD, CVA, Right AKA, presents from skilled nursing facility with chief complaint abdominal pain, nausea and weakness with associated hypotension with systolic blood pressure in 60s, hemoglobin 5.1, has history of superficial duodenal ulcers noted on endoscopy July 2019.  Admitted with working diagnosis of acute blood loss anemia and GI bleed  Assessment/Plan:  Acute GI bleed  with associated acute blood loss anemia. Baseline hemoglobin about 8.4 to 8.5, looks like he left the hospital in July around 7.5 Has history of duodenal ulcers -Underwent EGD, which showed non bleeding duodenal ulcer. Transfused 3 units PRBCs; hemoglobin is relatively stable around 7.6. Cont PPI. Monitor, TF as needed  Hypotension: initially think this was a mix of hypovolemic/ hemorrhagic shock; blood pressure now currently in the 90s suspect not far from baseline as he is on chronic midodrine. BP improved, Resume midodrine. Monitor   Elevated Trop I. History coronary artery disease. PCI/Stent (09/2016). Trop I slowly raising up to 1.25 Inverted ST and anterior lateral and inferior leads.  Right bundle branch block. -Awaiting echo.  Resume statin, BB, NTG prn. D/w GI who is okay to restart plavix recommended to hold aspirin.  Will resume plavix AM. Pt has PCI DES stent in 09/2016. Will consult cardiology is need further ischemic work up.  End-stage renal disease TTS. Cont HD per nephrology, appreciate the input   Left adrenal mass. F/u as out-pt   Fluid and electrolyte balance: hyperkalemia, hyponatremia. Resolved with HD.   Persistent leukocytosis: CXR: RLL infiltrate. Received Iv zosyn on admission. Will cont to complete the treatment course. Monitor   Diabetes. ha1c-7.3  Monitor on ISS  Mild  metabolic encephalopathy. May also be exacerbated by narcotics. Neuro exam is non focal. Slowly improving. Supportive  Care  Chronic lower extremity wounds: Left posterior calf with full-thickness wound, right stump with full-thickness wound. Cont wound care   abd pain: ? Gallstones vs Chronic pancreatitis . Cont PRN analgesia. Further recs per GI  Code Status: DNR Family Communication: d/w patient, RN (indicate person spoken with, relationship, and if by phone, the number) Disposition Plan: SDU   Consultants:  PCCM GI  Procedures: 11/11 admitted with acute GI bleed, symptomatic anemia, hypovolemic shock.  Initial hemoglobin 5.1.  Received 2 units PRBCs, GI consulted, recommended CT imaging, PPI twice daily.  Nephrology consulted 11/12 hemoglobin now 8.7 (having received total 3 units PRBC since admission)tolerated dialysis.   11/13.  Hemoglobin down to 7.3.  Otherwise stable.  EGD Left IJ triple-lumen catheter placed 11/12  Antibiotics: Anti-infectives (From admission, onward)   Start     Dose/Rate Route Frequency Ordered Stop   12/05/17 1830  piperacillin-tazobactam (ZOSYN) IVPB 2.25 g  Status:  Discontinued     2.25 g 100 mL/hr over 30 Minutes Intravenous  Once 12/05/17 1819 12/05/17 1825   12/05/17 1830  piperacillin-tazobactam (ZOSYN) IVPB 3.375 g     3.375 g 100 mL/hr over 30 Minutes Intravenous  Once 12/05/17 1826 12/05/17 1915         HPI/Subjective: Patient is confused. No focal weakness or paresthesias. Denies acute pains. No dyspnea.   Objective: Vitals:   12/08/17 1202 12/08/17 1225  BP: 137/61   Pulse: 67   Resp: 18   Temp: 98 F (36.7 C) 97.7 F (36.5 C)  SpO2: 98%  Intake/Output Summary (Last 24 hours) at 12/08/2017 1312 Last data filed at 12/08/2017 1202 Gross per 24 hour  Intake 1590 ml  Output 2100 ml  Net -510 ml   Filed Weights   12/08/17 0500 12/08/17 0830 12/08/17 1202  Weight: 88.7 kg 87.7 kg 85.7 kg    Exam:   General:   No distress   Cardiovascular: s1,s2 rrr  Respiratory: diminished at bases   Abdomen: soft, nt   Musculoskeletal: R AKA.    Data Reviewed: Basic Metabolic Panel: Recent Labs  Lab 12/05/17 2338 12/06/17 0422 12/06/17 0740 12/07/17 0346 12/08/17 0449 12/08/17 0836  NA 132* 133* 131* 133*  134* 134* 135  K 6.0* 5.5* 5.2* 4.5  4.3 4.8 4.8  CL 98 99 97* 100  103 101 103  CO2 19* 23 21* 23  23 21* 23  GLUCOSE 202* 205* 181* 188*  184* 151* 123*  BUN 89* 95* 81* 56*  53* 64* 64*  CREATININE 4.13* 4.53* 3.76* 3.66*  3.44* 4.33* 4.24*  CALCIUM 7.7* 8.0* 8.0* 8.4*  7.8* 8.2* 8.2*  MG 2.3 2.3  --   --   --   --   PHOS  --  4.2 4.0 3.8  --  6.4*   Liver Function Tests: Recent Labs  Lab 12/05/17 1449 12/06/17 0740 12/07/17 0346 12/08/17 0449 12/08/17 0836  AST 24  --  22 19  --   ALT 22  --  19 20  --   ALKPHOS 69  --  60 65  --   BILITOT 0.3  --  0.4 0.6  --   PROT 5.3*  --  5.2* 5.4*  --   ALBUMIN 2.4* 2.5* 2.6*  2.5* 2.6* 2.6*   Recent Labs  Lab 12/05/17 1449  LIPASE 19   No results for input(s): AMMONIA in the last 168 hours. CBC: Recent Labs  Lab 12/05/17 1449  12/06/17 0848 12/07/17 0346 12/07/17 1321 12/08/17 0449 12/08/17 0840  WBC 13.1*   < > 16.9* 14.0* 14.6* 13.7* 13.5*  NEUTROABS 9.3*  --   --   --   --   --   --   HGB 5.1*   < > 8.7* 7.4* 7.6* 7.6* 7.7*  HCT 17.4*   < > 28.7* 24.0* 25.4* 25.8* 25.5*  MCV 100.0   < > 96.3 97.2 99.2 101.2* 101.2*  PLT 344   < > 309 215 213 232 222   < > = values in this interval not displayed.   Cardiac Enzymes: Recent Labs  Lab 12/05/17 2338 12/06/17 0422 12/06/17 1432  TROPONINI 0.74* 0.89* 1.25*   BNP (last 3 results) Recent Labs    05/17/17 1550  BNP 754.3*    ProBNP (last 3 results) No results for input(s): PROBNP in the last 8760 hours.  CBG: Recent Labs  Lab 12/07/17 2343 12/08/17 0349 12/08/17 0739 12/08/17 1135 12/08/17 1224  GLUCAP 191* 144* 124* 79 103*    Recent  Results (from the past 240 hour(s))  Blood culture (routine x 2)     Status: None (Preliminary result)   Collection Time: 12/05/17  3:56 PM  Result Value Ref Range Status   Specimen Description BLOOD LEFT ANTECUBITAL  Final   Special Requests   Final    BOTTLES DRAWN AEROBIC AND ANAEROBIC Blood Culture adequate volume   Culture   Final    NO GROWTH 3 DAYS Performed at [facility], [place] [address] [place], [place] [address]  Report Status PENDING  Incomplete  Blood culture (routine x 2)     Status: None (Preliminary result)   Collection Time: 12/05/17  4:10 PM  Result Value Ref Range Status   Specimen Description BLOOD LEFT FOREARM  Final   Special Requests   Final    BOTTLES DRAWN AEROBIC ONLY Blood Culture results may not be optimal due to an inadequate volume of blood received in culture bottles   Culture   Final    NO GROWTH 3 DAYS Performed at [facility], [place] [address] [place], [place] [address]    Report Status PENDING  Incomplete  MRSA PCR Screening     Status: None   Collection Time: 12/05/17  9:14 PM  Result Value Ref Range Status   MRSA by PCR NEGATIVE NEGATIVE Final    Comment:        The GeneXpert MRSA Assay (FDA approved for NASAL specimens only), is one component of a comprehensive MRSA colonization surveillance program. It is not intended to diagnose MRSA infection nor to guide or monitor treatment for MRSA infections. Performed at [facility], [place] [address], [place], [place] [address]      Studies: No results found.  Scheduled Meds: . Chlorhexidine Gluconate Cloth  6 each Topical Q0600  . collagenase   Topical Daily  . gabapentin  300 mg Oral QHS  . insulin aspart  0-15 Units Subcutaneous Q4H  . insulin glargine  5 Units Subcutaneous QHS  . midodrine  5 mg Oral TID WC  . oxyCODONE  5 mg Oral Q8H  . pantoprazole  40 mg Oral BID AC   Continuous Infusions: . sodium chloride Stopped (12/08/17 0815)  . norepinephrine  (LEVOPHED) Adult infusion Stopped (12/06/17 1638)    Active Problems:   Leukocytosis   Anemia of chronic disease   DM (diabetes mellitus), type 2 with renal complications (HCC)   GI bleed   Pleural effusion, bilateral   Chronic pancreatitis ([place])   Left adrenal mass (HCC)   Chronic kidney disease (CKD), stage IV (severe) ([place])   Poor venous access    Time spent: >35 minutes     Kinnie Feil  [facility] Pager [phone]. If 7PM-7AM, please contact night-coverage at [email or web address], password [facility] 12/08/2017, 1:12 PM  LOS: 3 days

## 2017-12-08 NOTE — Consult Note (Addendum)
Cardiology Consultation:   [name] MRN: [id number]; DOB: 1945/10/21  Admit date: 12/05/2017 Date of Consult: 12/08/2017  Primary Care Provider: [name], MD Primary Cardiologist: Lauree Chandler, MD   Patient Profile:   Mitchell Garcia is a 72 y.o. male with a hx of CAD, DM, HTN, stroke, HTN,  chronic combined CHF/ICM,  ESRD, chronic anemia, superficial duodenal ulcers, s/p R AKA, and GI bleed who is being seen today for the evaluation of Elevated troponin at the request of Dr. Daleen Bo.   Admitted to [facility] 09/2016 with NSTEMI with troponin of 30, CT surgery consulted but felt not to be a candidate for CABG. On 9/19, went back to cath lab for PCI s/p orbital atherectomy with DES to ostial RCA, and PTCA/DES to distal RCA. Residual 50% prox LAD, 70% D1, CTO mLCx noted by initial diagnostic cath 10/08/16 to be treated medically.   Last seen when admitted 04/2017 for acute on chronic CHF. Started on HD. Trop peaked at 1.05 (NSTEMI 09/2016 peaked at 30.58). feld demand ischemia in the setting of worsening CKD and volume overload  History of Present Illness:   Mitchell Garcia is a resident of [place] who presented 12/05/17 to [facility] emergency department with abdominal pain, nausea and weakness. Noted hypotensive and anemic at Hgb of 5.1 on ASA and plavix. Transfused 3 units PRBCs. Hgb now improved to 7.5.  EGD revealed non-bleeding duodenal ulcer this admission. Given IV fluids. He is on chronic midodrine. CXR notable for RLL infiltrate. On abx due to persistent leucocytosis.   Cardiology is asked for evaluation of elevated troponin at presentation 0.74>>0.89>>1.25 in setting of severe anemia and ESRD. Patient denies any chest pain or dyspnea. EKG on admission showed ectopic rhythm, RBBB and non specific lateral TWI which is chronic - personally reviewed. Per note, plan to start Plavix tomorrow. Stop ASA. Pending reading of echo this admission.   Patient  is difficult historian and somewhat confused. Unable to fully complete dialysis this morning.   Past Medical History:  Diagnosis Date  . Acute kidney injury superimposed on chronic kidney disease ([place]) 10/15/2016  . Altered mental state 01/25/2013  . Anemia of chronic disease 01/25/2013  . CAD S/P percutaneous coronary angioplasty    a.not to be a candidate for CABG. On 9/19, went back to cath lab for PCI s/p orbital atherectomy with DES to ostial RCA, and PTCA/DES to distal RCA. Residual 50% prox LAD, 70% D1, CTO mLCx noted by initial diagnostic cath 10/08/16 to be treated medically.  . Cancer ([place])   . Cellulitis 07/2017   right leg  . Cellulitis of right lower extremity 07/25/2017  . Chronic combined systolic and diastolic CHF (congestive heart failure) ([place])   . CKD (chronic kidney disease), stage IV ([place])   . Diabetes mellitus with renal complications ([facility])    uncontrolled  . Essential hypertension 10/15/2016  . Humerus head fracture 01/27/2013  . Hyperlipidemia   . Hyponatremia 01/25/2013  . Insulin dependent diabetes mellitus ([place])   . Ischemic cardiomyopathy 10/15/2016  . MI (myocardial infarction) ([place])    years ago  . Stroke ([place])   . Transaminitis 01/25/2013  . Uncontrolled diabetes mellitus ([place]) 10/15/2016    Past Surgical History:  Procedure Laterality Date  . COLONOSCOPY WITH PROPOFOL N/A 08/09/2017   Procedure: COLONOSCOPY WITH PROPOFOL Hemostatic Clips;  Surgeon: Clarene Essex, MD;  Location: [place];  Service: Endoscopy;  Laterality: N/A;  . CORONARY STENT INTERVENTION N/A 10/13/2016   Procedure:  CORONARY STENT INTERVENTION;  Surgeon: Burnell Blanks, MD;  Location: [place] [facility];  Service: Cardiovascular;  Laterality: N/A;  . ESOPHAGOGASTRODUODENOSCOPY (EGD) WITH PROPOFOL N/A 08/08/2017   Procedure: ESOPHAGOGASTRODUODENOSCOPY (EGD) WITH PROPOFOL;  Surgeon: Clarene Essex, MD;  Location: [place];  Service: Endoscopy;  Laterality: N/A;  .  ESOPHAGOGASTRODUODENOSCOPY (EGD) WITH PROPOFOL N/A 12/07/2017   Procedure: ESOPHAGOGASTRODUODENOSCOPY (EGD) WITH PROPOFOL;  Surgeon: Clarene Essex, MD;  Location: [place];  Service: Endoscopy;  Laterality: N/A;  . IR FLUORO GUIDE CV LINE RIGHT  05/19/2017  . IR US GUIDE VASC ACCESS RIGHT  05/19/2017  . LEFT HEART CATH AND CORONARY ANGIOGRAPHY N/A 10/08/2016   Procedure: LEFT HEART CATH AND CORONARY ANGIOGRAPHY;  Surgeon: Nelva Bush, MD;  Location: [place] [facility];  Service: Cardiovascular;  Laterality: N/A;  . SCHLEROTHERAPY  08/09/2017   Procedure: Woodward Ku OF AVM;  Surgeon: Clarene Essex, MD;  Location: [place];  Service: Endoscopy;;  . SKIN CANCER EXCISION    . TEMPORARY PACEMAKER N/A 10/13/2016   Procedure: Temporary Pacemaker;  Surgeon: Burnell Blanks, MD;  Location: [place] [facility];  Service: Cardiovascular;  Laterality: N/A;     Inpatient Medications: Scheduled Meds: . atorvastatin  20 mg Oral q1800  . carvedilol  3.125 mg Oral BID WC  . Chlorhexidine Gluconate Cloth  6 each Topical Q0600  . collagenase   Topical Daily  . gabapentin  100 mg Oral QHS  . insulin aspart  0-15 Units Subcutaneous Q4H  . insulin glargine  5 Units Subcutaneous QHS  . midodrine  5 mg Oral TID WC  . oxyCODONE  5 mg Oral Q8H  . pantoprazole  40 mg Oral BID AC   Continuous Infusions: . sodium chloride Stopped (12/08/17 0815)  . norepinephrine (LEVOPHED) Adult infusion Stopped (12/06/17 1638)  . piperacillin-tazobactam (ZOSYN)  IV    . vancomycin    . [START ON 12/10/2017] vancomycin     PRN Meds: acetaminophen, fentaNYL (SUBLIMAZE) injection, ondansetron (ZOFRAN) IV  Allergies:    Allergies  Allergen Reactions  . Oxycodone Other (See Comments)    DIFFICULT TO WAKE UP  . Tramadol Nausea And Vomiting  . Lisinopril Other (See Comments)    Hyperkalemia   . Losartan Other (See Comments)    Hyperkalemia     Social History:   Social History   Socioeconomic History   . Marital status: Married    Spouse name: Not on file  . Number of children: Not on file  . Years of education: Not on file  . Highest education level: Not on file  Occupational History  . Not on file  Social Needs  . Financial resource strain: Not on file  . Food insecurity:    Worry: Not on file    Inability: Not on file  . Transportation needs:    Medical: Not on file    Non-medical: Not on file  Tobacco Use  . Smoking status: Never Smoker  . Smokeless tobacco: Never Used  Substance and Sexual Activity  . Alcohol use: No  . Drug use: No  . Sexual activity: Not on file  Lifestyle  . Physical activity:    Days per week: Not on file    Minutes per session: Not on file  . Stress: Not on file  Relationships  . Social connections:    Talks on phone: Not on file    Gets together: Not on file    Attends religious service: Not on file    Active  member of club or organization: Not on file    Attends meetings of clubs or organizations: Not on file    Relationship status: Not on file  . Intimate partner violence:    Fear of current or ex partner: Not on file    Emotionally abused: Not on file    Physically abused: Not on file    Forced sexual activity: Not on file  Other Topics Concern  . Not on file  Social History Narrative  . Not on file    Family History:   Family History  Problem Relation Age of Onset  . Hyperlipidemia Father      ROS:  Please see the history of present illness. All other ROS reviewed and negative.     Physical Exam/Data:   Vitals:   12/08/17 1202 12/08/17 1225 12/08/17 1300 12/08/17 1400  BP: 137/61  120/74 117/74  Pulse: 67  80 97  Resp: 18  18 (!) 26  Temp: 98 F (36.7 C) 97.7 F (36.5 C)    TempSrc: Oral Oral    SpO2: 98%  97% 98%  Weight: 85.7 kg     Height:        Intake/Output Summary (Last 24 hours) at 12/08/2017 1457 Last data filed at 12/08/2017 1202 Gross per 24 hour  Intake 1590 ml  Output 2100 ml  Net -510 ml    Filed Weights   12/08/17 0500 12/08/17 0830 12/08/17 1202  Weight: 88.7 kg 87.7 kg 85.7 kg   Body mass index is 27.11 kg/m.  General: ill appearing male in no acute distress HEENT: normal Lymph: no adenopathy Neck: no JVD Endocrine:  No thryomegaly Vascular: No carotid bruits; FA pulses 2+ bilaterally without bruits  Cardiac:  normal S1, S2; RRR; no murmu Lungs:  clear to auscultation bilaterally, no wheezing, rhonchi or rales  Abd: soft, nontender, no hepatomegaly  Ext: s/p R AKA, Left leg has ACE wrap and mild edema.  Musculoskeletal:  No deformities, BUE and BLE strength normal and equal Skin: warm and dry  Neuro:  CNs 2-12 intact, no focal abnormalities noted Psych:  Normal affect   Telemetry:  Telemetry was personally reviewed and demonstrates:  Sr with frequent PVCs   Relevant CV Studies:   Echocardiogram 07/09/17: Study Conclusions  - Left ventricle: The cavity size was normal. Wall thickness was   increased in a pattern of moderate LVH. Systolic function was   mildly to moderately reduced. The estimated ejection fraction was   in the range of 40% to 45%. Diffuse hypokinesis. Features are   consistent with a pseudonormal left ventricular filling pattern,   with concomitant abnormal relaxation and increased filling   pressure (grade 2 diastolic dysfunction). Doppler parameters are   consistent with high ventricular filling pressure. - Aortic valve: Mildly to moderately calcified annulus. Trileaflet;   mildly thickened leaflets. - Mitral valve: Mildly calcified annulus. Mildly thickened leaflets   . Valve area by pressure half-time: 0.93 cm^2. - Left atrium: The atrium was severely dilated. - Right atrium: The atrium was moderately dilated. - Atrial septum: No defect or patent foramen ovale was identified. - Pulmonary arteries: Systolic pressure was mildly to moderately   increased. PA peak pressure: 40 mm Hg (S). - Technically difficult study. Consider limited  study with   echocontrast to better evaluate LVEF and wall motion. Study Conclusions   Left Heart Catheterization 09/2016: Conclusions: 1. Multivessel coronary artery disease, including 50% proximal LAD stenosis, 70% proximal D1 lesion, chronic total occlusion of  mid LCx, and sequential 80% ostial and 95% distal RCA stenoses. 2. Moderate to severely elevated left ventricular filling pressure.  Recommendations: 1. Cardiac surgery consultation for CABG, given complex multivessel coronary artery disease, diabetes mellitus, and reduced LVEF. If the patient is deemed a poor surgical candidate, consider PCI to RCA (this would likely require atherectomy of the ostial lesion). 2. Restart heparin infusion 4 hours following TR band deflation.  3. Continue diuresis and optimization of heart failure therapy.   Subsequent LHC 09/2016: 1. Severe, calcified stenosis in the ostium of the RCA.  2. Successful orbital atherectomy of the ostial RCA with placement of a Synergy drug eluting stent 3. Severe distal RCA stenosis.  4. Successful PTCA/DES x 1 distal RCA (Synergy DES).   Recommendations: Continue DAPT with ASA and Plavix for at least one year.    Laboratory Data:  Chemistry Recent Labs  Lab 12/07/17 0346 12/08/17 0449 12/08/17 0836  NA 133*  134* 134* 135  K 4.5  4.3 4.8 4.8  CL 100  103 101 103  CO2 23  23 21* 23  GLUCOSE 188*  184* 151* 123*  BUN 56*  53* 64* 64*  CREATININE 3.66*  3.44* 4.33* 4.24*  CALCIUM 8.4*  7.8* 8.2* 8.2*  GFRNONAA 15*  16* 12* 13*  GFRAA 17*  19* 14* 15*  ANIONGAP 10  8 12 9     Recent Labs  Lab 12/05/17 1449  12/07/17 0346 12/08/17 0449 12/08/17 0836  PROT 5.3*  --  5.2* 5.4*  --   ALBUMIN 2.4*   < > 2.6*  2.5* 2.6* 2.6*  AST 24  --  22 19  --   ALT 22  --  19 20  --   ALKPHOS 69  --  60 65  --   BILITOT 0.3  --  0.4 0.6  --    < > = values in this interval not displayed.   Hematology Recent Labs  Lab 12/07/17 1321  12/08/17 0449 12/08/17 0840  WBC 14.6* 13.7* 13.5*  RBC 2.56* 2.55* 2.52*  HGB 7.6* 7.6* 7.7*  HCT 25.4* 25.8* 25.5*  MCV 99.2 101.2* 101.2*  MCH 29.7 29.8 30.6  MCHC 29.9* 29.5* 30.2  RDW 18.2* 19.0* 18.9*  PLT 213 232 222   Cardiac Enzymes Recent Labs  Lab 12/05/17 2338 12/06/17 0422 12/06/17 1432  TROPONINI 0.74* 0.89* 1.25*   No results for input(s): TROPIPOC in the last 168 hours.  BNPNo results for input(s): BNP, PROBNP in the last 168 hours.  DDimer No results for input(s): DDIMER in the last 168 hours.  Radiology/Studies:  Dg Chest Port 1 View  Result Date: 12/06/2017 CLINICAL DATA:  Shortness of breath EXAM: PORTABLE CHEST 1 VIEW COMPARISON:  12/05/2017 FINDINGS: Cardiac shadow remains enlarged. Bilateral jugular catheters are again noted and stable. Bilateral pleural effusions right greater than left are again noted and stable. Patchy infiltrate is noted in the bases bilaterally slightly increased when compared with the prior exam. No pneumothorax is noted. No bony abnormality is seen. IMPRESSION: Stable pleural effusions. Slight increase in bibasilar infiltrates. Electronically Signed   By: Inez Catalina M.D.   On: 12/06/2017 07:06   Dg Chest Port 1 View  Result Date: 12/05/2017 CLINICAL DATA:  Central line placement EXAM: PORTABLE CHEST 1 VIEW COMPARISON:  12/05/2017, 08/05/2017 FINDINGS: Right-sided central venous catheter tip over the SVC. Left IJ central venous catheter tip over the SVC. No left pneumothorax cardiomegaly with bilateral pleural effusions and bibasilar airspace disease. Vascular  congestion with continued pulmonary edema. Aortic atherosclerosis. IMPRESSION: 1. Left IJ central venous catheter tip superimposes the SVC. There is no left pneumothorax 2. Continued cardiomegaly with vascular congestion, mild pulmonary edema and left greater than right pleural effusions. Effusion on the right appears slightly increased. Electronically Signed   By: Donavan Foil  M.D.   On: 12/05/2017 23:47   Dg Chest Portable 1 View  Result Date: 12/05/2017 CLINICAL DATA:  Left leg edema. EXAM: PORTABLE CHEST 1 VIEW COMPARISON:  Radiograph August 05, 2017. FINDINGS: Stable cardiomegaly. Atherosclerosis of thoracic aorta is noted. Right internal jugular dialysis catheter is unchanged in position. No pneumothorax is noted. Mild bibasilar subsegmental atelectasis is noted with bilateral pleural effusions, left greater than right. Bony thorax is unremarkable. IMPRESSION: Mild bibasilar subsegmental atelectasis is noted with bilateral pleural effusions, left greater than right. Aortic Atherosclerosis (ICD10-I70.0). Electronically Signed   By: Marijo Conception, M.D.   On: 12/05/2017 15:30   Ct Renal Stone Study  Result Date: 12/05/2017 CLINICAL DATA:  Abdominal pain nausea vomiting and lethargy EXAM: CT ABDOMEN AND PELVIS WITHOUT CONTRAST TECHNIQUE: Multidetector CT imaging of the abdomen and pelvis was performed following the standard protocol without IV contrast. COMPARISON:  None. FINDINGS: Lower chest: Small left pleural effusion. Small moderate right pleural effusion. Mild consolidations at both bases may reflect atelectasis or pneumonia. Cardiomegaly with trace pericardial effusion. Small gas bubbles in the right atrium. Extensive coronary vascular calcification. Hepatobiliary: Gallstones. No focal hepatic abnormality or biliary dilatation. Pancreas: Coarse calcifications at the head and uncinate process of the pancreas likely reflecting prior pancreatitis. No acute inflammatory changes. Spleen: Normal in size without focal abnormality. Adrenals/Urinary Tract: Nodularity of the left adrenal gland with 17 mm low-density mass incompletely characterized. Kidneys show no hydronephrosis. 3 mm stone mid left kidney. Intrarenal vascular calcification. Punctate stones in the lower pole of the left kidney. Mildly thick-walled appearance of the bladder. Stomach/Bowel: Stomach nonenlarged. No  dilated small bowel. No colon wall thickening. Negative appendix. Vascular/Lymphatic: Extensive aortic atherosclerosis. No aneurysm. No significantly enlarged lymph nodes. Reproductive: Coarse prostate calcifications. Other: No free air or free fluid. Musculoskeletal: Degenerative changes. No acute or suspicious abnormality. IMPRESSION: 1. Bilateral pleural effusions, small on the left and small moderate on the right. Partial consolidations in the lower lobes may reflect atelectasis or pneumonia. 2. Cardiomegaly. Small foci of air in the right atrium suspected to be secondary to peripheral line placement 3. Gallstones 4. Negative for hydronephrosis or ureteral stone. Nonobstructing stones in the left kidney. 5. Thick-walled appearance of the urinary bladder, possible cystitis 6. 17 mm low-density left adrenal mass incompletely characterized without contrast. Could perform nonemergent adrenal CT to better evaluate. 7. Coarse calcifications in the region of the pancreatic head and uncinate process likely reflecting chronic pancreatitis. Electronically Signed   By: Donavan Foil M.D.   On: 12/05/2017 17:55    Assessment and Plan:   1. Elevated troponin -  troponin at presentation 0.74>>0.89>>1.25 in setting of severe anemia and ESRD. Patient denies any chest pain or dyspnea. Troponin 09/2016 peaked at 30.58 during NSTEMI. Likely demand in current setting.  - Plan to resume plavix tomorrow (last PCI 09/2016). EKG without acute changes. Pending echo to reassess LVEF. Not a candidate for invasive procedure given anemia and confusion.   2. CAD s/p PCI - no angina. As above.   3. Chronic combined CHF - LVEF of 40-45% by last echo 06/2017. Pending echo this admission. Volume managed by dialysis. Aggressive medical therapy limited due to  chronic hypotension. Continue low dose BB and midodrine.   4. HLD - No results found for requested labs within last 8760 hours.  - Continue statin  Otherwise per primary team   - GI bleed/severe anemia -ESRD on HD  (TTS) -Adrenal mass -DM -S/p R AKA with chronic LLE wound - Abdominal pain - Leucocytosis   Dr. Meda Coffee to see later today   For questions or updates, please contact [facility] Please consult [email or web address] for contact info under   Jarrett Soho, [place]  12/08/2017 2:57 PM   Patient progressing well the patient was seen, examined and discussed with Bhagat,Bhavinkumar PA-C and I agree with the above.   72 y.o. male with a hx of CAD, DM, HTN, stroke, HTN,  chronic combined CHF/ICM,  ESRD, chronic anemia, superficial duodenal ulcers, s/p R AKA, and GI bleed who is being seen today for the evaluation of Elevated troponin. He has h/o     NSTEMI in 09/2016 with troponin of 30, CT surgery consulted but felt not to be a candidate for CABG. On 9/19, went back to cath lab for PCI s/p orbital atherectomy with DES to ostial RCA, and PTCA/DES to distal RCA. Residual 50% prox LAD, 70% D1, CTO mLCx noted by initial diagnostic cath 10/08/16 to be treated medically. Admitted in 04/2017 for acute on chronic CHF. Started on HD. Trop peaked at 1.05 (NSTEMI 09/2016 peaked at 30.58). feld demand ischemia in the setting of worsening CKD and volume overload. He presented on 12/05/2017 with with abdominal pain, nausea and weakness. Noted hypotensive and anemic at Hgb of 5.1 on ASA and plavix. Transfused 3 units PRBCs. Hgb now improved to 7.5.  EGD revealed non-bleeding duodenal ulcer this admission. Given IV fluids. He is on chronic midodrine. CXR notable for RLL infiltrate. On abx due to persistent leucocytosis.  He was found to have elevated troponin 0.74>>0.89>>1.25, EKG shows sinus rhythm with right bundle branch block, unchanged from prior.  On physical exam patient denies any chest pain now or in the last couple months.  He denies any dyspnea as well.  He has no JVDs, S1-S2 no significant murmur, clear lungs, no swelling in lower extremities.  Assessment and  plan Troponin elevation in the settings of severe anemia and end-stage renal disease.  Patient is asymptomatic.  Cardiac catheterization will be contraindicated in the settings of acute GI bleed, will not proceed with any ischemic work-up, continue medical therapy with atorvastatin, carvedilol, no aspirin secondary to GI bleed but Plavix is scheduled to be restarted tomorrow.  Ena Dawley, MD 12/08/2017

## 2017-12-08 NOTE — Progress Notes (Signed)
Pharmacy Antibiotic Note  Mitchell Garcia is a 72 y.o. male admitted on 12/05/2017, antibiotics are now being initiated for persistent leukocytosis without a specified source. Pt is ESRD on HD, last HD session was today for 3.5 hours at a reduced BFR.    Plan: -Vancomycin 2 g IV x1 then 1g IV qHD -Zosyn 3.37 g IV q12h -Monitor renal fx, cultures, VR as needed   Height: 5\' 10"  (177.8 cm) Weight: 188 lb 15 oz (85.7 kg) IBW/kg (Calculated) : 73  Temp (24hrs), Avg:97.9 F (36.6 C), Min:97.5 F (36.4 C), Max:98.3 F (36.8 C)  Recent Labs  Lab 12/06/17 0422 12/06/17 0740 12/06/17 0848 12/07/17 0346 12/07/17 1321 12/08/17 0449 12/08/17 0836 12/08/17 0840  WBC 18.1*  --  16.9* 14.0* 14.6* 13.7*  --  13.5*  CREATININE 4.53* 3.76*  --  3.66*  3.44*  --  4.33* 4.24*  --      Antimicrobials this admission: 11/14 vancomycin > 11/14 zosyn >  Dose adjustments this admission: N/A  Microbiology results: 11/11 blood cx: ngtd 11/11 mrsa pcr: neg   Harvel Quale 12/08/2017 2:08 PM

## 2017-12-08 NOTE — Progress Notes (Signed)
Pt. Hemodialysis catheter flow is poor unable to complete at prescribed orders BFR rate of 400 BFR currently at 280. Catheter lines has been power flushed and reversed with no success of improvement of flow. Dr. Marval Regal aware.

## 2017-12-09 DIAGNOSIS — E1122 Type 2 diabetes mellitus with diabetic chronic kidney disease: Secondary | ICD-10-CM

## 2017-12-09 DIAGNOSIS — Z794 Long term (current) use of insulin: Secondary | ICD-10-CM

## 2017-12-09 LAB — CBC
HEMATOCRIT: 25.6 % — AB (ref 39.0–52.0)
HEMOGLOBIN: 7.4 g/dL — AB (ref 13.0–17.0)
MCH: 30.1 pg (ref 26.0–34.0)
MCHC: 28.9 g/dL — AB (ref 30.0–36.0)
MCV: 104.1 fL — ABNORMAL HIGH (ref 80.0–100.0)
Platelets: 203 10*3/uL (ref 150–400)
RBC: 2.46 MIL/uL — ABNORMAL LOW (ref 4.22–5.81)
RDW: 19.4 % — AB (ref 11.5–15.5)
WBC: 9.8 10*3/uL (ref 4.0–10.5)
nRBC: 0.4 % — ABNORMAL HIGH (ref 0.0–0.2)

## 2017-12-09 LAB — GLUCOSE, CAPILLARY
GLUCOSE-CAPILLARY: 209 mg/dL — AB (ref 70–99)
Glucose-Capillary: 109 mg/dL — ABNORMAL HIGH (ref 70–99)
Glucose-Capillary: 117 mg/dL — ABNORMAL HIGH (ref 70–99)
Glucose-Capillary: 135 mg/dL — ABNORMAL HIGH (ref 70–99)
Glucose-Capillary: 145 mg/dL — ABNORMAL HIGH (ref 70–99)
Glucose-Capillary: 226 mg/dL — ABNORMAL HIGH (ref 70–99)
Glucose-Capillary: 227 mg/dL — ABNORMAL HIGH (ref 70–99)

## 2017-12-09 MED ORDER — CLOPIDOGREL BISULFATE 75 MG PO TABS
75.0000 mg | ORAL_TABLET | Freq: Every day | ORAL | Status: DC
  Administered 2017-12-09 – 2017-12-14 (×6): 75 mg via ORAL
  Filled 2017-12-09 (×6): qty 1

## 2017-12-09 NOTE — Progress Notes (Signed)
S:No new complaints and no events overnight. O:BP 116/81 (BP Location: Left Arm)   Pulse 77   Temp 97.7 F (36.5 C) (Oral)   Resp 18   Ht 5\' 10"  (1.778 m)   Wt 86 kg   SpO2 95%   BMI 27.20 kg/m   Intake/Output Summary (Last 24 hours) at 12/09/2017 1322 Last data filed at 12/09/2017 0900 Gross per 24 hour  Intake 572.33 ml  Output 10 ml  Net 562.33 ml   Intake/Output: I/O last 3 completed shifts: In: 1762.7 [I.V.:1200; IV Piggyback:562.7] Out: 2110 [Urine:110; Other:2000]  Intake/Output this shift:  Total I/O In: 9.6 [IV Piggyback:9.6] Out: -  Weight change: -1 kg Gen:NAD CVS: no rub Resp: cta Abd: benign Ext: no edema, s/p RBKA, Ravf +T/B  Recent Labs  Lab 12/05/17 1449 12/05/17 1507 12/05/17 2338 12/06/17 0422 12/06/17 0740 12/07/17 0346 12/08/17 0449 12/08/17 0836  NA 131* 129* 132* 133* 131* 133*  134* 134* 135  K 5.5* 5.5* 6.0* 5.5* 5.2* 4.5  4.3 4.8 4.8  CL 95* 97* 98 99 97* 100  103 101 103  CO2 25  --  19* 23 21* 23  23 21* 23  GLUCOSE 189* 186* 202* 205* 181* 188*  184* 151* 123*  BUN 88* 89* 89* 95* 81* 56*  53* 64* 64*  CREATININE 4.03* 4.20* 4.13* 4.53* 3.76* 3.66*  3.44* 4.33* 4.24*  ALBUMIN 2.4*  --   --   --  2.5* 2.6*  2.5* 2.6* 2.6*  CALCIUM 7.7*  --  7.7* 8.0* 8.0* 8.4*  7.8* 8.2* 8.2*  PHOS  --   --   --  4.2 4.0 3.8  --  6.4*  AST 24  --   --   --   --  22 19  --   ALT 22  --   --   --   --  19 20  --    Liver Function Tests: Recent Labs  Lab 12/05/17 1449  12/07/17 0346 12/08/17 0449 12/08/17 0836  AST 24  --  22 19  --   ALT 22  --  19 20  --   ALKPHOS 69  --  60 65  --   BILITOT 0.3  --  0.4 0.6  --   PROT 5.3*  --  5.2* 5.4*  --   ALBUMIN 2.4*   < > 2.6*  2.5* 2.6* 2.6*   < > = values in this interval not displayed.   Recent Labs  Lab 12/05/17 1449  LIPASE 19   No results for input(s): AMMONIA in the last 168 hours. CBC: Recent Labs  Lab 12/05/17 1449  12/07/17 0346 12/07/17 1321 12/08/17 0449  12/08/17 0840 12/09/17 0522  WBC 13.1*   < > 14.0* 14.6* 13.7* 13.5* 9.8  NEUTROABS 9.3*  --   --   --   --   --   --   HGB 5.1*   < > 7.4* 7.6* 7.6* 7.7* 7.4*  HCT 17.4*   < > 24.0* 25.4* 25.8* 25.5* 25.6*  MCV 100.0   < > 97.2 99.2 101.2* 101.2* 104.1*  PLT 344   < > 215 213 232 222 203   < > = values in this interval not displayed.   Cardiac Enzymes: Recent Labs  Lab 12/05/17 2338 12/06/17 0422 12/06/17 1432 12/08/17 1601  TROPONINI 0.74* 0.89* 1.25* 0.57*   CBG: Recent Labs  Lab 12/08/17 2325 12/09/17 0510 12/09/17 0708 12/09/17 1139 12/09/17 1240  GLUCAP  171* 145* 135* 109* 117*    Iron Studies: No results for input(s): IRON, TIBC, TRANSFERRIN, FERRITIN in the last 72 hours. Studies/Results: No results found. [name] atorvastatin  20 mg Oral q1800  . Chlorhexidine Gluconate Cloth  6 each Topical Q0600  . clopidogrel  75 mg Oral Daily  . collagenase   Topical Daily  . gabapentin  100 mg Oral QHS  . insulin aspart  0-15 Units Subcutaneous Q4H  . insulin glargine  5 Units Subcutaneous QHS  . midodrine  5 mg Oral TID WC  . oxyCODONE  5 mg Oral Q8H  . pantoprazole  40 mg Oral BID AC    BMET    Component Value Date/Time   NA 135 12/08/2017 0836   NA 144 11/01/2016 1532   K 4.8 12/08/2017 0836   CL 103 12/08/2017 0836   CO2 23 12/08/2017 0836   GLUCOSE 123 (H) 12/08/2017 0836   BUN 64 (H) 12/08/2017 0836   BUN 66 (H) 11/01/2016 1532   CREATININE 4.24 (H) 12/08/2017 0836   CALCIUM 8.2 (L) 12/08/2017 0836   CALCIUM 8.2 (L) 05/19/2017 0230   GFRNONAA 13 (L) 12/08/2017 0836   GFRAA 15 (L) 12/08/2017 0836   CBC    Component Value Date/Time   WBC 9.8 12/09/2017 0522   RBC 2.46 (L) 12/09/2017 0522   HGB 7.4 (L) 12/09/2017 0522   HGB 8.7 (L) 11/01/2016 1532   HCT 25.6 (L) 12/09/2017 0522   HCT 26.6 (L) 11/01/2016 1532   PLT 203 12/09/2017 0522   PLT 328 11/01/2016 1532   MCV 104.1 (H) 12/09/2017 0522   MCV 92 11/01/2016 1532   MCH 30.1 12/09/2017 0522    MCHC 28.9 (L) 12/09/2017 0522   RDW 19.4 (H) 12/09/2017 0522   RDW 15.3 11/01/2016 1532   LYMPHSABS 1.9 12/05/2017 1449   MONOABS 1.7 (H) 12/05/2017 1449   EOSABS 0.1 12/05/2017 1449   BASOSABS 0.0 12/05/2017 1449    Assessment/Plan:72 year old white male with multiple medical problems including ESRD and previous GI bleed, now presenting with a hemoglobin of 5.1and GI sxms 1. Severe anemia/GIB-do not know what recent hemoglobin has been. Feature of hospitalization in July was GI bleed status post scopes showing ulcers, polyps and an oozing AVM. GI has already been consulted and s/p EGD which revealed non-bleeding duodenal ulcer and probable AVM's. Received 2 units for hemoglobin 5.1.Reportedly on Plavixas OP.  Continue with ESA and transfuse prn.   2. ESRD:Normally TTS at the [facility]. Using PermCath but AV fistula is close for use. Will continue with outpatient schedule-no heparin.  3. Hypertension:Was hypotensive in the emergency department but improved after blood. Home medication list includes Coreg and BiDil but also midodrine?  4. Anemia of ESRD:  Appropriate response following transfusions.  Will attempt to find out when ESA was dosed at outpatient kidney center but likely will re-dose here 5. Metabolic Bone Disease:Medication list includes Sensipar 30 daily and Renvela. Will resume once GI issues resolved 6. Psych-pt seems very depressed asking "is this all worth it" Stating that 4 hours of dialysis is torture, makes him feel terrible- consider a palliative care consult while in house  7. Left adrenal mass- f/u as outpatient.  Donetta Potts, MD [employer] [phone]

## 2017-12-09 NOTE — Progress Notes (Signed)
Patient was transferred from [facility].

## 2017-12-09 NOTE — Progress Notes (Signed)
[facility] PROGRESS NOTE  Mitchell Garcia QVZ:[id number] DOB: [birth year] DOA: 12/05/2017 PCP: [name], MD  Brief summary   72 year old male patient with PMH of CAD CABG, PCI stent 09/2016, HTN, ESRD on HD, CVA, Right AKA, presents from skilled nursing facility with chief complaint abdominal pain, nausea and weakness with associated hypotension with systolic blood pressure in 60s, hemoglobin 5.1, has history of superficial duodenal ulcers noted on endoscopy July 2019.  Admitted with working diagnosis of acute blood loss anemia and GI bleed  Assessment/Plan:  Acute GI bleed  with associated acute blood loss anemia. Baseline hemoglobin about 8.4 to 8.5, looks like he left the hospital in July around 7.5 Has history of duodenal ulcers -Underwent EGD, which showed non bleeding duodenal ulcer. Transfused 3 units PRBCs; hemoglobin is relatively stable around 7.4. Cont PPI. Will recheck HG AM, TF  With HD tomorrow. Monitor, TF as needed  Hypotension: initially think this was a mix of hypovolemic/ hemorrhagic shock; blood pressure now currently in the 90s suspect not far from baseline as he is on chronic midodrine. BP improved, Resume midodrine. Monitor   Elevated Trop I. History coronary artery disease. PCI/Stent (09/2016). Trop I slowly raising up to 1.25 Inverted ST and anterior lateral and inferior leads.  Right bundle branch block. -Echo. LVEF 25-30% (previously 40-45%). Resumed statin, NTG prn. Hold BB due to bradycardia.  D/w GI who is okay to restart plavix recommended to hold aspirin.  Resume plavix 11/15. Pt has PCI DES stent in 09/2016.Pt is not a good candidate for interventions per cardiology. Appreciate the input   End-stage renal disease TTS. Cont HD per nephrology, appreciate the input   Left adrenal mass. F/u as out-pt   Fluid and electrolyte balance: hyperkalemia, hyponatremia. Resolved with HD.   Persistent leukocytosis: CXR: RLL infiltrate. Received Iv zosyn on  admission.  -cont iv antibiotics 24-48 hrs, then to PO regimen to complete the treatment course. Monitor   Diabetes. ha1c-7.3  Monitor on ISS  Mild metabolic encephalopathy. May also be exacerbated by narcotics. Neuro exam is non focal. Slowly improving. Supportive  Care  Chronic lower extremity wounds: Left posterior calf with full-thickness wound, right stump with full-thickness wound. Cont wound care   abd pain: ? Gallstones vs Chronic pancreatitis . Cont PRN analgesia. Further recs per GI  Code Status: DNR Family Communication: d/w patient, RN (indicate person spoken with, relationship, and if by phone, the number) Disposition Plan: SDU.    Consultants:  PCCM GI  Procedures: 11/11 admitted with acute GI bleed, symptomatic anemia, hypovolemic shock.  Initial hemoglobin 5.1.  Received 2 units PRBCs, GI consulted, recommended CT imaging, PPI twice daily.  Nephrology consulted 11/12 hemoglobin now 8.7 (having received total 3 units PRBC since admission)tolerated dialysis.   11/13.  Hemoglobin down to 7.3.  Otherwise stable.  EGD Left IJ triple-lumen catheter placed 11/12  Antibiotics: Anti-infectives (From admission, onward)   Start     Dose/Rate Route Frequency Ordered Stop   12/10/17 1200  vancomycin (VANCOCIN) IVPB 1000 mg/200 mL premix     1,000 mg 200 mL/hr over 60 Minutes Intravenous Every T-Th-Sa (Hemodialysis) 12/08/17 1414     12/08/17 1500  vancomycin (VANCOCIN) 2,000 mg in sodium chloride 0.9 % 500 mL IVPB     2,000 mg 250 mL/hr over 120 Minutes Intravenous  Once 12/08/17 1414 12/08/17 1703   12/08/17 1500  piperacillin-tazobactam (ZOSYN) IVPB 3.375 g     3.375 g 12.5 mL/hr over 240 Minutes Intravenous Every 12 hours 12/08/17  1414     12/05/17 1830  piperacillin-tazobactam (ZOSYN) IVPB 2.25 g  Status:  Discontinued     2.25 g 100 mL/hr over 30 Minutes Intravenous  Once 12/05/17 1819 12/05/17 1825   12/05/17 1830  piperacillin-tazobactam (ZOSYN) IVPB 3.375 g      3.375 g 100 mL/hr over 30 Minutes Intravenous  Once 12/05/17 1826 12/05/17 1915        HPI/Subjective: Mild confused. No focal weakness or paresthesias. Denies acute pains. No dyspnea. Hg is 7.4. No acute bleeding   Objective: Vitals:   12/09/17 0800 12/09/17 0804  BP: 98/63 (!) 107/55  Pulse: (!) 45 60  Resp: (!) 21   Temp:    SpO2: 95%     Intake/Output Summary (Last 24 hours) at 12/09/2017 0932 Last data filed at 12/09/2017 0500 Gross per 24 hour  Intake 562.72 ml  Output 2010 ml  Net -1447.28 ml   Filed Weights   12/08/17 0830 12/08/17 1202 12/09/17 0500  Weight: 87.7 kg 85.7 kg 86 kg    Exam:   General:  No distress   Cardiovascular: s1,s2 rrr  Respiratory: diminished at bases   Abdomen: soft, nt   Musculoskeletal: R AKA.    Data Reviewed: Basic Metabolic Panel: Recent Labs  Lab 12/05/17 2338 12/06/17 0422 12/06/17 0740 12/07/17 0346 12/08/17 0449 12/08/17 0836  NA 132* 133* 131* 133*  134* 134* 135  K 6.0* 5.5* 5.2* 4.5  4.3 4.8 4.8  CL 98 99 97* 100  103 101 103  CO2 19* 23 21* 23  23 21* 23  GLUCOSE 202* 205* 181* 188*  184* 151* 123*  BUN 89* 95* 81* 56*  53* 64* 64*  CREATININE 4.13* 4.53* 3.76* 3.66*  3.44* 4.33* 4.24*  CALCIUM 7.7* 8.0* 8.0* 8.4*  7.8* 8.2* 8.2*  MG 2.3 2.3  --   --   --   --   PHOS  --  4.2 4.0 3.8  --  6.4*   Liver Function Tests: Recent Labs  Lab 12/05/17 1449 12/06/17 0740 12/07/17 0346 12/08/17 0449 12/08/17 0836  AST 24  --  22 19  --   ALT 22  --  19 20  --   ALKPHOS 69  --  60 65  --   BILITOT 0.3  --  0.4 0.6  --   PROT 5.3*  --  5.2* 5.4*  --   ALBUMIN 2.4* 2.5* 2.6*  2.5* 2.6* 2.6*   Recent Labs  Lab 12/05/17 1449  LIPASE 19   No results for input(s): AMMONIA in the last 168 hours. CBC: Recent Labs  Lab 12/05/17 1449  12/07/17 0346 12/07/17 1321 12/08/17 0449 12/08/17 0840 12/09/17 0522  WBC 13.1*   < > 14.0* 14.6* 13.7* 13.5* 9.8  NEUTROABS 9.3*  --   --   --   --   --    --   HGB 5.1*   < > 7.4* 7.6* 7.6* 7.7* 7.4*  HCT 17.4*   < > 24.0* 25.4* 25.8* 25.5* 25.6*  MCV 100.0   < > 97.2 99.2 101.2* 101.2* 104.1*  PLT 344   < > 215 213 232 222 203   < > = values in this interval not displayed.   Cardiac Enzymes: Recent Labs  Lab 12/05/17 2338 12/06/17 0422 12/06/17 1432 12/08/17 1601  TROPONINI 0.74* 0.89* 1.25* 0.57*   BNP (last 3 results) Recent Labs    05/17/17 1550  BNP 754.3*    ProBNP (last 3 results)  No results for input(s): PROBNP in the last 8760 hours.  CBG: Recent Labs  Lab 12/08/17 1612 12/08/17 2047 12/08/17 2325 12/09/17 0510 12/09/17 0708  GLUCAP 180* 171* 171* 145* 135*    Recent Results (from the past 240 hour(s))  Blood culture (routine x 2)     Status: None (Preliminary result)   Collection Time: 12/05/17  3:56 PM  Result Value Ref Range Status   Specimen Description BLOOD LEFT ANTECUBITAL  Final   Special Requests   Final    BOTTLES DRAWN AEROBIC AND ANAEROBIC Blood Culture adequate volume   Culture   Final    NO GROWTH 4 DAYS Performed at [facility], [place] [address], [place], [place] [address]    Report Status PENDING  Incomplete  Blood culture (routine x 2)     Status: None (Preliminary result)   Collection Time: 12/05/17  4:10 PM  Result Value Ref Range Status   Specimen Description BLOOD LEFT FOREARM  Final   Special Requests   Final    BOTTLES DRAWN AEROBIC ONLY Blood Culture results may not be optimal due to an inadequate volume of blood received in culture bottles   Culture   Final    NO GROWTH 4 DAYS Performed at [facility], [place] [address], [place], [place] [address]    Report Status PENDING  Incomplete  MRSA PCR Screening     Status: None   Collection Time: 12/05/17  9:14 PM  Result Value Ref Range Status   MRSA by PCR NEGATIVE NEGATIVE Final    Comment:        The GeneXpert MRSA Assay (FDA approved for NASAL specimens only), is one component of a comprehensive MRSA  colonization surveillance program. It is not intended to diagnose MRSA infection nor to guide or monitor treatment for MRSA infections. Performed at [facility], [place] [address] [place], [place] [address]      Studies: No results found.  Scheduled Meds: . atorvastatin  20 mg Oral q1800  . carvedilol  3.125 mg Oral BID WC  . Chlorhexidine Gluconate Cloth  6 each Topical Q0600  . clopidogrel  75 mg Oral Daily  . collagenase   Topical Daily  . gabapentin  100 mg Oral QHS  . insulin aspart  0-15 Units Subcutaneous Q4H  . insulin glargine  5 Units Subcutaneous QHS  . midodrine  5 mg Oral TID WC  . oxyCODONE  5 mg Oral Q8H  . pantoprazole  40 mg Oral BID AC   Continuous Infusions: . sodium chloride Stopped (12/08/17 0815)  . norepinephrine (LEVOPHED) Adult infusion Stopped (12/06/17 1638)  . piperacillin-tazobactam (ZOSYN)  IV 12.5 mL/hr at 12/09/17 0500  . [START ON 12/10/2017] vancomycin      Active Problems:   Leukocytosis   Anemia of chronic disease   DM (diabetes mellitus), type 2 with renal complications (HCC)   GI bleed   Pleural effusion, bilateral   Chronic pancreatitis ([place])   Left adrenal mass (HCC)   Chronic kidney disease (CKD), stage IV (severe) ([place])   Poor venous access    Time spent: >35 minutes     Kinnie Feil  [facility] Pager [phone]. If 7PM-7AM, please contact night-coverage at [email or web address], password [email or web address] 12/09/2017, 9:32 AM  LOS: 4 days

## 2017-12-09 NOTE — Progress Notes (Signed)
MEDICATION RELATED NOTE  There is a drug-drug interaction between proton pump inhibitors (PPI) and Plavix. They are both metabolized through [facility] which may reduce the effectiveness of Plavix. Pantoprazole has a lower risk of interaction compared to other PPIs; however, when using this combination it is recommended to use the lowest dose and frequency of PPI.   Lynnett Langlinais, Jake Church 12/09/2017,9:55 AM

## 2017-12-10 ENCOUNTER — Encounter (HOSPITAL_COMMUNITY): Payer: Self-pay | Admitting: Family Medicine

## 2017-12-10 DIAGNOSIS — I248 Other forms of acute ischemic heart disease: Secondary | ICD-10-CM

## 2017-12-10 DIAGNOSIS — I5022 Chronic systolic (congestive) heart failure: Secondary | ICD-10-CM

## 2017-12-10 DIAGNOSIS — J189 Pneumonia, unspecified organism: Secondary | ICD-10-CM

## 2017-12-10 LAB — CBC
HCT: 26.7 % — ABNORMAL LOW (ref 39.0–52.0)
HEMATOCRIT: 27 % — AB (ref 39.0–52.0)
Hemoglobin: 8.1 g/dL — ABNORMAL LOW (ref 13.0–17.0)
Hemoglobin: 7.9 g/dL — ABNORMAL LOW (ref 13.0–17.0)
MCH: 30.6 pg (ref 26.0–34.0)
MCH: 30.2 pg (ref 26.0–34.0)
MCHC: 30 g/dL (ref 30.0–36.0)
MCHC: 29.6 g/dL — AB (ref 30.0–36.0)
MCV: 101.9 fL — ABNORMAL HIGH (ref 80.0–100.0)
MCV: 101.9 fL — ABNORMAL HIGH (ref 80.0–100.0)
NRBC: 0.2 % (ref 0.0–0.2)
PLATELETS: 223 10*3/uL (ref 150–400)
Platelets: 260 10*3/uL (ref 150–400)
RBC: 2.65 MIL/uL — AB (ref 4.22–5.81)
RBC: 2.62 MIL/uL — ABNORMAL LOW (ref 4.22–5.81)
RDW: 19.6 % — ABNORMAL HIGH (ref 11.5–15.5)
RDW: 19.2 % — AB (ref 11.5–15.5)
WBC: 11.2 10*3/uL — AB (ref 4.0–10.5)
WBC: 10.2 10*3/uL (ref 4.0–10.5)
nRBC: 0.4 % — ABNORMAL HIGH (ref 0.0–0.2)

## 2017-12-10 LAB — RENAL FUNCTION PANEL
ANION GAP: 6 (ref 5–15)
Albumin: 2.6 g/dL — ABNORMAL LOW (ref 3.5–5.0)
BUN: 20 mg/dL (ref 8–23)
CALCIUM: 7.9 mg/dL — AB (ref 8.9–10.3)
CO2: 28 mmol/L (ref 22–32)
CREATININE: 2.29 mg/dL — AB (ref 0.61–1.24)
Chloride: 101 mmol/L (ref 98–111)
GFR, EST AFRICAN AMERICAN: 31 mL/min — AB (ref >60)
GFR, EST NON AFRICAN AMERICAN: 26 mL/min — AB (ref >60)
Glucose, Bld: 120 mg/dL — ABNORMAL HIGH (ref 70–99)
PHOSPHORUS: 2.1 mg/dL — AB (ref 2.5–4.6)
Potassium: 3.2 mmol/L — ABNORMAL LOW (ref 3.5–5.1)
SODIUM: 135 mmol/L (ref 135–145)

## 2017-12-10 LAB — CULTURE, BLOOD (ROUTINE X 2)
CULTURE: NO GROWTH
CULTURE: NO GROWTH
Special Requests: ADEQUATE

## 2017-12-10 LAB — GLUCOSE, CAPILLARY
GLUCOSE-CAPILLARY: 197 mg/dL — AB (ref 70–99)
GLUCOSE-CAPILLARY: 156 mg/dL — AB (ref 70–99)
Glucose-Capillary: 151 mg/dL — ABNORMAL HIGH (ref 70–99)
Glucose-Capillary: 154 mg/dL — ABNORMAL HIGH (ref 70–99)

## 2017-12-10 MED ORDER — METOLAZONE 2.5 MG PO TABS
2.5000 mg | ORAL_TABLET | Freq: Every day | ORAL | Status: DC
  Administered 2017-12-10 – 2017-12-11 (×2): 2.5 mg via ORAL
  Filled 2017-12-10 (×3): qty 1

## 2017-12-10 MED ORDER — VANCOMYCIN HCL IN DEXTROSE 1-5 GM/200ML-% IV SOLN
INTRAVENOUS | Status: AC
  Filled 2017-12-10: qty 200

## 2017-12-10 MED ORDER — FENTANYL CITRATE (PF) 100 MCG/2ML IJ SOLN
INTRAMUSCULAR | Status: AC
  Administered 2017-12-10: 12.5 ug via INTRAVENOUS
  Filled 2017-12-10: qty 2

## 2017-12-10 MED ORDER — NITROGLYCERIN 0.4 MG SL SUBL
0.4000 mg | SUBLINGUAL_TABLET | SUBLINGUAL | Status: DC | PRN
  Administered 2017-12-11 (×3): 0.4 mg via SUBLINGUAL
  Filled 2017-12-10: qty 1

## 2017-12-10 MED ORDER — HEPARIN SODIUM (PORCINE) 1000 UNIT/ML IJ SOLN
INTRAMUSCULAR | Status: AC
  Administered 2017-12-10: 3200 [IU]
  Filled 2017-12-10: qty 4

## 2017-12-10 MED ORDER — MIDODRINE HCL 5 MG PO TABS
ORAL_TABLET | ORAL | Status: AC
  Filled 2017-12-10: qty 1

## 2017-12-10 MED ORDER — ISOSORB DINITRATE-HYDRALAZINE 20-37.5 MG PO TABS
1.0000 | ORAL_TABLET | Freq: Two times a day (BID) | ORAL | Status: DC
  Administered 2017-12-10 – 2017-12-14 (×9): 1 via ORAL
  Filled 2017-12-10 (×10): qty 1

## 2017-12-10 MED ORDER — BUSPIRONE HCL 5 MG PO TABS
2.5000 mg | ORAL_TABLET | Freq: Two times a day (BID) | ORAL | Status: DC
  Administered 2017-12-10 – 2017-12-14 (×9): 2.5 mg via ORAL
  Filled 2017-12-10 (×10): qty 0.5

## 2017-12-10 MED ORDER — AMOXICILLIN-POT CLAVULANATE 500-125 MG PO TABS
1.0000 | ORAL_TABLET | Freq: Every day | ORAL | Status: DC
  Administered 2017-12-10 – 2017-12-14 (×5): 500 mg via ORAL
  Filled 2017-12-10 (×5): qty 1

## 2017-12-10 MED ORDER — POTASSIUM PHOSPHATE MONOBASIC 500 MG PO TABS
500.0000 mg | ORAL_TABLET | Freq: Three times a day (TID) | ORAL | Status: AC
  Filled 2017-12-10 (×3): qty 1

## 2017-12-10 NOTE — Progress Notes (Addendum)
PROGRESS NOTE  Mitchell Garcia WIO:[id number] DOB: 1945/02/11 DOA: 12/05/2017 PCP: [name], MD followed mainly at [facility] and receives dialysis at [facility].  Brief Narrative: 72 year old man resident of [facility] presented with abdominal pain, nausea and weakness.  In the ED found to have borderline hypotension, hemoglobin 5.1.  Seen by GI with recommendation for CT and transfusion PRBC.  Developed profound hypotension while in CT.  Admitted by critical care for GI bleed, hemorrhagic shock.  Transfused PRBCs with stabilization and subsequent transfer to hospitalist service.  Assessment/Plan Acute GI bleed, symptomatic acute blood loss anemia, hemorrhagic shock with hypotension, thought to be secondary to duodenal ulcer, probable AVMs. Colonoscopy and endoscopy 07/2017 showing ulcers with small polyps and possibly using AVM status post therapy.  Patient status post 3 units PRBC.  Status post EGD 11/13: One nonbleeding duodenal ulcer. --Continue PPI per GI.  GI has signed off. --Hemoglobin stable  Chronic hypotension as the patient is on midodrine. --Normotensive.  Demand ischemia in the setting of severe anemia and end-stage renal disease.  Echo showed worsening of LVEF to 25-30%.  PMH CAD.  No clear ischemic changes on EKG.  Right bundle branch block noted.  CT did show coronary calcification consistent with CAD. --Plavix restarted per GI but aspirin on hold. --Cardiology recommended medical management, catheterization felt to be contraindicated in the setting of acute GI bleed.  Continue atorvastatin, Plavix. --Has not been able to tolerate beta-blocker secondary to intermittent bradycardia  Chronic systolic CHF with acute worsening of LVEF, now 25-30%. --Beta-blocker on hold secondary to bradycardia.  Per cardiology not a candidate for further investigation.  Pneumonia considered based on initial CT findings.  Chest x-ray bilateral pleural effusions..  Treating with  IV antibiotics.  CT renal stone protocol showed bilateral pleural effusions, possible atelectasis or pneumonia in the lower lobes. --Appears asymptomatic.  Change to oral antibiotic. Stop vanc.  Hypophosphatemia --replete  ESRD TTS --s/p HD today   Left adrenal mass --Follow-up as an outpatient with nonemergent adrenal CT  DM type 2 with PAD --CBG stable.  Chronic left calf wound, right AKA stump wound treated at wound care center. --Continue wound care as per wound care RN   Seems to be improving.  Will replete potassium, phosphorus, changed to oral antibiotics.  Can likely return to SNF 11/17  DVT prophylaxis: none, no medication secondary to acute blood loss anemia, no SCDs secondary to left leg wound and right AKA. Code Status: DNR Family Communication: I discussed with wife by telephone Disposition Plan: return to [place] Guard, MD  [facility] Direct contact: [phone] --Via amion app OR  --[email or web address]; password [email or web address]  7PM-7AM contact night coverage as above 12/10/2017, 1:54 PM  LOS: 5 days   Consultants:  Admitted by critical care  Nephrology  Cardiology  Procedures:  11/12 central venous catheter insertion  Transfusion 3 units PRBC  EGD 11/13: One nonbleeding duodenal ulcer.  Echo Study Conclusions  - Left ventricle: The cavity size was normal. Wall thickness was   increased in a pattern of mild LVH. Systolic function was   severely reduced. The estimated ejection fraction was in the   range of 25% to 30%. Akinesis of the basal-midinferior   myocardium. - Aortic valve: There was mild stenosis. Valve area (Vmax): 3.07   cm^2. - Left atrium: The atrium was mildly dilated. - Right ventricle: The cavity size was moderately dilated. - Right atrium: The atrium was moderately dilated. - Pulmonary arteries: Systolic  pressure was moderately increased.   PA peak pressure: 44 mm Hg (S).  Antimicrobials:    Interval  history/Subjective: Feels ok, no pain, no complaints.  Objective: Vitals:  Vitals:   12/10/17 1106 12/10/17 1144  BP: 124/71 133/62  Pulse: 90 82  Resp: 18 18  Temp: 97.6 F (36.4 C) (!) 97.4 F (36.3 C)  SpO2: 92% 94%    Exam:  Constitutional:  . Appears calm and comfortable Eyes:  . pupils appear normal ENMT:  . Hard of hearing Respiratory:  . CTA bilaterally, no w/r/r.  . Respiratory effort normal.  Cardiovascular:  . RRR, no m/r/g . No LLE extremity edema   Abdomen:  . Soft, nondistended Psychiatric:  . Mental status o Mood, affect appropriate o Oriented to person, [facility] hospital, month  I have personally reviewed the following:   Data: . CBG stable . Potassium 3.2, phosphorus 2.1, remainder BMP unremarkable . Hemoglobin stable 7.9, remainder CBC unremarkable  Scheduled Meds: . amoxicillin-clavulanate  1 tablet Oral Daily  . atorvastatin  20 mg Oral q1800  . busPIRone  2.5 mg Oral BID  . Chlorhexidine Gluconate Cloth  6 each Topical Q0600  . clopidogrel  75 mg Oral Daily  . collagenase   Topical Daily  . gabapentin  100 mg Oral QHS  . heparin      . insulin aspart  0-15 Units Subcutaneous Q4H  . insulin glargine  5 Units Subcutaneous QHS  . isosorbide-hydrALAZINE  1 tablet Oral BID  . metolazone  2.5 mg Oral Daily  . midodrine  5 mg Oral TID WC  . oxyCODONE  5 mg Oral Q8H  . pantoprazole  40 mg Oral BID AC   Continuous Infusions:   Principal Problem:   GI bleed Active Problems:   Anemia of chronic disease   DM (diabetes mellitus), type 2 with renal complications (HCC)   Acute blood loss anemia   ESRD on dialysis (HCC)   Pleural effusion, bilateral   Left adrenal mass (HCC)   Poor venous access   Chronic systolic CHF (congestive heart failure) ([place])   Demand ischemia ([place])   Pneumonia   LOS: 5 days    Time spent 35 minutes, greater than 50% counseling and coordination of care.  Discussion with wife as above.  Discussion with  patient at bedside.

## 2017-12-10 NOTE — Clinical Social Work Note (Signed)
Clinical Social Work Assessment  Patient Details  Name: Mitchell Garcia MRN: [id number] Date of Birth: 06/02/1945  Date of referral:  12/10/17               Reason for consult:  Discharge Planning                Permission sought to share information with:  Case Manager, Facility Sport and exercise psychologist, Family Supports Permission granted to share information::  Yes, Verbal Permission Granted  Name::     Mitchell Garcia  Agency::  SNFs  Relationship::  spouse  Contact Information:  [phone]  Housing/Transportation Living arrangements for the past 2 months:  [place] of Information:  Patient Patient Interpreter Needed:  None Criminal Activity/Legal Involvement Pertinent to Current Situation/Hospitalization:  Yes Significant Relationships:  Spouse Lives with:  Facility Resident Do you feel safe going back to the place where you live?  Yes Need for family participation in patient care:  Yes (Comment)  Care giving concerns:  CSW received referral for possible SNF placement at time of discharge. Spoke with patient regarding possibility of SNF placement . Patient's family and wife   is currently unable to care for her at their home given patient's current needs and fall risk.  Patient and wife Mitchell Garcia expressed understanding of PT recommendation and are agreeable to SNF placement at time of discharge. CSW to continue to follow and assist with discharge planning needs.     Social Worker assessment / plan:  Spoke with patient and his Wife Mitchell Garcia concerning possibility of rehab at [facility] before returning home. They report being from [facility] in which they are currently holding their bed.    Employment status:  Retired Nurse, adult PT Recommendations:  [place] / Referral to community resources:  [place]  Patient/Family's Response to care:  Patient and  His wife recognize need for rehab before  returning home and are agreeable to a SNF in [place]. They report preference for  Returning to [facility] which is holding a bed for patient currently. CSW explained insurance authorization process. Patient's family reported that they want patient to get stronger to be able to come back home.    Patient/Family's Understanding of and Emotional Response to Diagnosis, Current Treatment, and Prognosis: Patient/family is realistic regarding therapy needs and expressed being hopeful for SNF placement. Patient expressed understanding of CSW role and discharge process as well as medical condition. No questions/concerns about plan or treatment.   Emotional Assessment Appearance:  Appears stated age Attitude/Demeanor/Rapport:  Engaged, Gracious Affect (typically observed):  Accepting, Adaptable Orientation:  Oriented to Place, Oriented to Self Alcohol / Substance use:  Not Applicable Psych involvement (Current and /or in the community):  No (Comment)  Discharge Needs  Concerns to be addressed:  Discharge Planning Concerns Readmission within the last 30 days:  No Current discharge risk:  Dependent with Mobility Barriers to Discharge:  Continued Medical Work up   [employer], LCSW 12/10/2017, 4:40 PM

## 2017-12-10 NOTE — NC FL2 (Signed)
[place] LEVEL OF CARE SCREENING TOOL     IDENTIFICATION  Patient Name: Mitchell Garcia Birthdate: [birth year] Sex: male Admission Date (Current Location): 12/05/2017  [facility] and [place] Number:  Herbalist and Address:  [facility] [place], [place] [address] [place], [place] [address]      Provider Number: [id number]  Attending Physician Name and Address:  Samuella Cota, MD  Relative Name and Phone Number:  [name] [phone]    Current Level of Care: Hospital Recommended Level of Care: [place] Prior Approval Number:    Date Approved/Denied:   PASRR Number: [id number]  Discharge Plan: SNF    Current Diagnoses: Patient Active Problem List   Diagnosis Date Noted  . Chronic systolic CHF (congestive heart failure) ([place]) 12/10/2017  . Demand ischemia ([place]) 12/10/2017  . Pneumonia 12/10/2017  . Poor venous access   . GI bleed 12/05/2017  . Pleural effusion, bilateral 12/05/2017  . Chronic pancreatitis ([place]) 12/05/2017  . Left adrenal mass ([place]) 12/05/2017  . BRBPR (bright red blood per rectum)   . Dermatitis   . Skin lesions   . Acute pain of right knee   . Cellulitis 07/25/2017  . Syncope 07/07/2017  . Pressure injury of skin 07/07/2017  . ESRD on dialysis ([place]) 05/24/2017  . Acute blood loss anemia 05/18/2017  . Acute on chronic combined systolic and diastolic CHF (congestive heart failure) ([place]) 05/17/2017  . MI (myocardial infarction) ([place])   . Ischemic cardiomyopathy 10/15/2016  . CAD S/P percutaneous coronary angioplasty 10/15/2016  . Acute kidney injury superimposed on chronic kidney disease ([place]) 10/15/2016  . DM (diabetes mellitus), type 2 with renal complications ([place]) 25/00/3704  . Essential hypertension 10/15/2016  . Acute combined systolic (congestive) and diastolic (congestive) heart failure ([place])   . NSTEMI (non-ST elevated myocardial infarction) ([place]) 10/04/2016  . Acute on chronic  diastolic heart failure ([place]) 01/27/2013  . Humerus head fracture 01/27/2013  . Altered mental state 01/25/2013  . Acute renal failure superimposed on chronic kidney disease ([place]) 01/25/2013  . Transaminitis 01/25/2013  . Anemia of chronic disease 01/25/2013  . Hyponatremia 01/25/2013  . Hyperglycemia 01/25/2013    Orientation RESPIRATION BLADDER Height & Weight     Self, Place  Normal Continent Weight: 104 lb 4.4 oz (47.3 kg) Height:  5\' 10"  (177.8 cm)  BEHAVIORAL SYMPTOMS/MOOD NEUROLOGICAL BOWEL NUTRITION STATUS      Continent Diet(see discharge summary)  AMBULATORY STATUS COMMUNICATION OF NEEDS Skin   Extensive Assist Verbally Other (Comment), Surgical wounds(ecchymosis bilateral arm, MASD sacrum and scrotum, ecchymosis and MASD right leg, cellulitis left leg, open/dehisced wound left and right legs)                       Personal Care Assistance Level of Assistance  Bathing, Feeding, Dressing, Total care Bathing Assistance: Maximum assistance Feeding assistance: Independent Dressing Assistance: Maximum assistance Total Care Assistance: Maximum assistance   Functional Limitations Info  Sight, Hearing, Speech Sight Info: Adequate Hearing Info: Impaired Speech Info: Adequate    SPECIAL CARE FACTORS FREQUENCY  PT (By licensed PT), OT (By licensed OT)     PT Frequency: min 5x weekly OT Frequency: min 3x weekly            Contractures Contractures Info: Not present    Additional Factors Info  Code Status, Allergies Code Status Info: DNR Allergies Info: Allergies:  Oxycodone, Tramadol, Lisinopril, Losartan  Current Medications (12/10/2017):  This is the current hospital active medication list Current Facility-Administered Medications  Medication Dose Route Frequency Provider Last Rate Last Dose  . acetaminophen (TYLENOL) tablet 650 mg  650 mg Oral Q6H PRN Clarene Essex, MD   650 mg at 12/08/17 1804  . amoxicillin-clavulanate (AUGMENTIN) 500-125 MG  per tablet 500 mg  1 tablet Oral Daily Samuella Cota, MD   500 mg at 12/10/17 1536  . atorvastatin (LIPITOR) tablet 20 mg  20 mg Oral q1800 Kinnie Feil, MD   20 mg at 12/09/17 1711  . busPIRone (BUSPAR) tablet 2.5 mg  2.5 mg Oral BID Samuella Cota, MD   2.5 mg at 12/10/17 1522  . Chlorhexidine Gluconate Cloth 2 % PADS 6 each  6 each Topical Q0600 Clarene Essex, MD   6 each at 12/10/17 0546  . clopidogrel (PLAVIX) tablet 75 mg  75 mg Oral Daily Kinnie Feil, MD   75 mg at 12/10/17 1159  . collagenase (SANTYL) ointment   Topical Daily Magod, Altamese Dilling, MD      . gabapentin (NEURONTIN) capsule 100 mg  100 mg Oral QHS Kinnie Feil, MD   100 mg at 12/09/17 2251  . heparin 1000 UNIT/ML injection           . insulin aspart (novoLOG) injection 0-15 Units  0-15 Units Subcutaneous Q4H Clarene Essex, MD   3 Units at 12/10/17 1200  . insulin glargine (LANTUS) injection 5 Units  5 Units Subcutaneous QHS Clarene Essex, MD   5 Units at 12/09/17 2251  . isosorbide-hydrALAZINE (BIDIL) 20-37.5 MG per tablet 1 tablet  1 tablet Oral BID Samuella Cota, MD   1 tablet at 12/10/17 1536  . metolazone (ZAROXOLYN) tablet 2.5 mg  2.5 mg Oral Daily Samuella Cota, MD   2.5 mg at 12/10/17 1524  . midodrine (PROAMATINE) tablet 5 mg  5 mg Oral TID WC Clarene Essex, MD   5 mg at 12/10/17 1201  . nitroGLYCERIN (NITROSTAT) SL tablet 0.4 mg  0.4 mg Sublingual Q5 Min x 3 PRN Samuella Cota, MD      . ondansetron [facility]) injection 4 mg  4 mg Intravenous Q6H PRN Clarene Essex, MD   4 mg at 12/06/17 1257  . oxyCODONE (Oxy IR/ROXICODONE) immediate release tablet 5 mg  5 mg Oral Q8H Clarene Essex, MD   5 mg at 12/10/17 1523  . pantoprazole (PROTONIX) EC tablet 40 mg  40 mg Oral BID AC Clarene Essex, MD   40 mg at 12/10/17 1201  . potassium phosphate (monobasic) (K-PHOS ORIGINAL) tablet 500 mg  500 mg Oral TID WC & HS Samuella Cota, MD         Discharge Medications: Please see discharge summary for a list of  discharge medications.  Relevant Imaging Results:  Relevant Lab Results:   Additional Information SSN: [id number]  Alberteen Sam, LCSW

## 2017-12-10 NOTE — Procedures (Signed)
I was present at this dialysis session. I have reviewed the session itself and made appropriate changes.  Complaining of neuropathic pain of legs and feet and wants gold bond cream.  Vital signs in last 24 hours:  Temp:  [97.6 F (36.4 C)-98.4 F (36.9 C)] 97.6 F (36.4 C) (11/16 0652) Pulse Rate:  [49-77] 59 (11/16 0652) Resp:  [16-25] 22 (11/16 0652) BP: (105-132)/(47-81) 132/80 (11/16 0652) SpO2:  [84 %-100 %] 92 % (11/16 0652) Weight change:  Filed Weights   12/08/17 0830 12/08/17 1202 12/09/17 0500  Weight: 87.7 kg 85.7 kg 86 kg    Recent Labs  Lab 12/08/17 0836  NA 135  K 4.8  CL 103  CO2 23  GLUCOSE 123*  BUN 64*  CREATININE 4.24*  CALCIUM 8.2*  PHOS 6.4*    Recent Labs  Lab 12/05/17 1449  12/08/17 0840 12/09/17 0522 12/10/17 0538  WBC 13.1*   < > 13.5* 9.8 11.2*  NEUTROABS 9.3*  --   --   --   --   HGB 5.1*   < > 7.7* 7.4* 8.1*  HCT 17.4*   < > 25.5* 25.6* 27.0*  MCV 100.0   < > 101.2* 104.1* 101.9*  PLT 344   < > 222 203 260   < > = values in this interval not displayed.    Scheduled Meds: . atorvastatin  20 mg Oral q1800  . Chlorhexidine Gluconate Cloth  6 each Topical Q0600  . clopidogrel  75 mg Oral Daily  . collagenase   Topical Daily  . gabapentin  100 mg Oral QHS  . insulin aspart  0-15 Units Subcutaneous Q4H  . insulin glargine  5 Units Subcutaneous QHS  . midodrine  5 mg Oral TID WC  . oxyCODONE  5 mg Oral Q8H  . pantoprazole  40 mg Oral BID AC   Continuous Infusions: . piperacillin-tazobactam (ZOSYN)  IV 3.375 g (12/10/17 0308)  . vancomycin     PRN Meds:.acetaminophen, fentaNYL (SUBLIMAZE) injection, ondansetron (ZOFRAN) IV   Donetta Potts,  MD 12/10/2017, 8:48 AM

## 2017-12-11 ENCOUNTER — Inpatient Hospital Stay (HOSPITAL_COMMUNITY): Payer: Medicare Other

## 2017-12-11 DIAGNOSIS — I5022 Chronic systolic (congestive) heart failure: Secondary | ICD-10-CM

## 2017-12-11 DIAGNOSIS — R400 Somnolence: Secondary | ICD-10-CM

## 2017-12-11 DIAGNOSIS — N186 End stage renal disease: Secondary | ICD-10-CM

## 2017-12-11 DIAGNOSIS — Z992 Dependence on renal dialysis: Secondary | ICD-10-CM

## 2017-12-11 LAB — GLUCOSE, CAPILLARY
GLUCOSE-CAPILLARY: 129 mg/dL — AB (ref 70–99)
GLUCOSE-CAPILLARY: 145 mg/dL — AB (ref 70–99)
GLUCOSE-CAPILLARY: 150 mg/dL — AB (ref 70–99)
GLUCOSE-CAPILLARY: 211 mg/dL — AB (ref 70–99)
Glucose-Capillary: 150 mg/dL — ABNORMAL HIGH (ref 70–99)
Glucose-Capillary: 123 mg/dL — ABNORMAL HIGH (ref 70–99)
Glucose-Capillary: 135 mg/dL — ABNORMAL HIGH (ref 70–99)

## 2017-12-11 LAB — TROPONIN I: Troponin I: 0.38 ng/mL (ref <0.03)

## 2017-12-11 MED ORDER — OXYCODONE HCL 5 MG PO TABS: 5.0000 mg | ORAL_TABLET | Freq: Three times a day (TID) | ORAL | Status: DC | PRN

## 2017-12-11 MED ORDER — HALOPERIDOL LACTATE 5 MG/ML IJ SOLN
5.0000 mg | Freq: Once | INTRAMUSCULAR | Status: AC
  Administered 2017-12-11: 5 mg via INTRAVENOUS
  Filled 2017-12-11: qty 1

## 2017-12-11 MED ORDER — MORPHINE SULFATE (PF) 2 MG/ML IV SOLN
2.0000 mg | Freq: Once | INTRAVENOUS | Status: AC
  Administered 2017-12-11: 2 mg via INTRAVENOUS

## 2017-12-11 MED ORDER — MORPHINE SULFATE (PF) 2 MG/ML IV SOLN
INTRAVENOUS | Status: AC
  Filled 2017-12-11: qty 1

## 2017-12-11 NOTE — Progress Notes (Signed)
Pt having active chest pain at this time. Rating 7/10 after 2 Nitroglycerin. Pt also c/o nausea. Admin. Zofran. Did EKG and made Rapid and MD aware. Rapid on way to access. Awaiting MD response. Viitals are WNL. BP 119/61, P 49  Eleanora Neighbor, RN

## 2017-12-11 NOTE — Progress Notes (Signed)
Pt still very confused, pulling at HD catheter, trying to get out of bed, and being combative toward staff. MD made aware. Pt given Halodal, mitts placed on hands, and a posey belt due to pt climbing out of bed. Pt stilll very restless at this time. Will continue to monitor.   Eleanora Neighbor, RN

## 2017-12-11 NOTE — Progress Notes (Signed)
Chest pain and nausea have resolved. Pt is resting comfortably in the bed at this time.  Eleanora Neighbor, RN

## 2017-12-11 NOTE — Progress Notes (Signed)
Pt is very confused trying to get out of the bed and talking about throwing tele box through the wall. Pt keeps pressing call light. RN has tried redirecting pt without much success. Pt thinks staff is keeping pt against will. Will try to decrease lights/setting and get pt ready for bed.Will continue to monitor.   Eleanora Neighbor, RN

## 2017-12-11 NOTE — Significant Event (Signed)
Rapid Response Event Note Called by nursing staff for CP  Overview: Time Called: 0650 Arrival Time: 0653 Event Type: Cardiac  Initial Focused Assessment: Upon arrival, Mitchell Garcia was confused however able to answer simple questions.  He c/o 7/10 CP after 2 SL NTG tabs.  EKG was already obtained and no acute ST segment changes.  Christy RN notified Cardiology and received orders for Morphine and troponins.  After the morphine, Mitchell Garcia stated his chest pain was about gone and started complaining his abdomen.  Interventions: -SL NTG x3 -EKG -Morphine -Troponins  Plan of Care (if not transferred): -Monitor for further CP -Call primary svc and/or RRRN for any further clinical decompensations  Event Summary:   at      at    Outcome: Stayed in room and stabalized  Event End Time: 0720  Madelynn Done

## 2017-12-11 NOTE — Progress Notes (Signed)
PROGRESS NOTE    Mitchell Garcia  FWY:[id number] DOB: [birth year] DOA: 12/05/2017 PCP: [name], MD   Brief Narrative: Mitchell Garcia is a 72 y.o. male with a history of CAD, ESRD on HD, chronic anemia, CHF. He presented secondary to abdominal pain, nausea and weakness found to have a hemoglobin of 5.1 secondary to presumed upper GI bleeding. He suffered hemorrhagic shock requiring transfusion and critical care admission. EGD performed and significant for a non-bleeding duodenal ulcer.   Assessment & Plan:   Principal Problem:   GI bleed Active Problems:   Anemia of chronic disease   DM (diabetes mellitus), type 2 with renal complications (HCC)   Acute blood loss anemia   ESRD on dialysis (HCC)   Pleural effusion, bilateral   Left adrenal mass (HCC)   Poor venous access   Chronic systolic CHF (congestive heart failure) (HCC)   Demand ischemia (HCC)   Pneumonia   Acute GI bleed Symptomatic anemia No definitive source but possibly secondary to a duodenal ulcer seen on EGD vs AVMs. GI currently signed off -Continue protonix -Aspirin discontinued  Somnolence Excessive. On chart review, patient receiving scheduled oxycodone. Oxycodone listed on allergy list for "difficulty to wake up." Also received morphine IV. -Discontinue oxycodone  Chest pain Patient with a history of PCI with stent placement; patient on plavix and aspirin as an outpatient. Overnight episode sounds more like epigastric pain. Troponin obtained and trending down from previous. EKG without evidence of ACS. -Continue Plavix -Continue BiDil  Pneumonia Patient treated empirically with Vancomycin/Zosyn. Switched to Augmentin -Continue Augmentin  Demand ischemia Patient evaluated by cardiology.   Chronic systolic heart failure EF of 25-30%. -Dialysis for fluid balance -Continue BiDil; Coreg held  Hypotension Chronic. -Continue midodrine  ESRD on HD TTS -Per nephrology  Diabetes  mellitus, type 2 -Continue Lantus 5 units and SSI  Chronic left calf wound Right AKA -Continue wound care  Pressure Injury Documentation:     DVT prophylaxis: SCDs Code Status:   Code Status: DNR Family Communication: Wife on telephone Disposition Plan: Discharge to SNF in 24-48 hours if back to baseline   Consultants:   Gastroenterology  Cardiology  Procedures:   EGD (11/13) Impression:               - Normal larynx.                           - Tiny hiatal hernia.                           - Normal stomach.                           - One non-bleeding duodenal ulcer with pigmented                            material.                           - Normal duodenal bulb, second portion of the                            duodenum, major papilla, third portion of the  duodenum and fourth portion of the duodenum.                           - Normal examined jejunum.                           - The examination was otherwise normal.                           - No specimens collected. Recommendation:           - Patient has a contact number available for                            emergencies. The signs and symptoms of potential                            delayed complications were discussed with the                            patient. Return to normal activities tomorrow.                            Written discharge instructions were provided to the                            patient.                           - Soft diet today.                           - Continue present medications. He may be on                            aspirin and Plavix at home and will reevaluate his                            aspirin use and I think when he goes to the [place] or                            [facility] hospital they may restart his medicines and                            I do not believe he was on pump inhibitors at home                            which is probably why  his ulcer has not healed                           - Return to GI clinic PRN. Continue once a day pump                            inhibitor at home   Transthoracic Echocardiogram (  11/14) Study Conclusions  - Left ventricle: The cavity size was normal. Wall thickness was   increased in a pattern of mild LVH. Systolic function was   severely reduced. The estimated ejection fraction was in the   range of 25% to 30%. Akinesis of the basal-midinferior   myocardium. - Aortic valve: There was mild stenosis. Valve area (Vmax): 3.07   cm^2. - Left atrium: The atrium was mildly dilated. - Right ventricle: The cavity size was moderately dilated. - Right atrium: The atrium was moderately dilated. - Pulmonary arteries: Systolic pressure was moderately increased.   PA peak pressure: 44 mm Hg (S).  Antimicrobials:  Vancomycin  Zosyn  Augmentin    Subjective: Epigastric pain overnight.   Objective: Vitals:   12/11/17 0446 12/11/17 0638 12/11/17 0706 12/11/17 0932  BP: (!) 135/50 119/61 130/79 129/65  Pulse: (!) 50 (!) 49 (!) 108 (!) 54  Resp: 18  (!) 22 18  Temp: 98 F (36.7 C)   99 F (37.2 C)  TempSrc: Oral   Oral  SpO2: 96% 96% 100% 95%  Weight:      Height:        Intake/Output Summary (Last 24 hours) at 12/11/2017 1034 Last data filed at 12/11/2017 0935 Gross per 24 hour  Intake 800 ml  Output 2000 ml  Net -1200 ml   Filed Weights   12/10/17 1106 12/10/17 2052 12/11/17 0414  Weight: 47.3 kg 47.3 kg 47.3 kg    Examination:  General exam: Appears calm and comfortable Respiratory system: Clear to auscultation. Respiratory effort normal. Cardiovascular system: S1 & S2 heard, RRR. No murmurs, rubs, gallops or clicks. Gastrointestinal system: Abdomen is nondistended, soft and nontender. No organomegaly or masses felt. Normal bowel sounds heard. Central nervous system: Somnolent but arouses easily. Answers questions inappropriately. Follows commands  intermittently Extremities: Right AKA Skin: No cyanosis. No rashes Psychiatry: Judgement and insight appear impaired    Data Reviewed: I have personally reviewed following labs and imaging studies  CBC: Recent Labs  Lab 12/05/17 1449  12/08/17 0449 12/08/17 0840 12/09/17 0522 12/10/17 0538 12/10/17 0800  WBC 13.1*   < > 13.7* 13.5* 9.8 11.2* 10.2  NEUTROABS 9.3*  --   --   --   --   --   --   HGB 5.1*   < > 7.6* 7.7* 7.4* 8.1* 7.9*  HCT 17.4*   < > 25.8* 25.5* 25.6* 27.0* 26.7*  MCV 100.0   < > 101.2* 101.2* 104.1* 101.9* 101.9*  PLT 344   < > 232 222 203 260 223   < > = values in this interval not displayed.   Basic Metabolic Panel: Recent Labs  Lab 12/05/17 2338 12/06/17 0422 12/06/17 0740 12/07/17 0346 12/08/17 0449 12/08/17 0836 12/10/17 0800  NA 132* 133* 131* 133*  134* 134* 135 135  K 6.0* 5.5* 5.2* 4.5  4.3 4.8 4.8 3.2*  CL 98 99 97* 100  103 101 103 101  CO2 19* 23 21* 23  23 21* 23 28  GLUCOSE 202* 205* 181* 188*  184* 151* 123* 120*  BUN 89* 95* 81* 56*  53* 64* 64* 20  CREATININE 4.13* 4.53* 3.76* 3.66*  3.44* 4.33* 4.24* 2.29*  CALCIUM 7.7* 8.0* 8.0* 8.4*  7.8* 8.2* 8.2* 7.9*  MG 2.3 2.3  --   --   --   --   --   PHOS  --  4.2 4.0 3.8  --  6.4* 2.1*   GFR: Estimated Creatinine  Clearance: 18.9 mL/min (A) (by C-G formula based on SCr of 2.29 mg/dL (H)). Liver Function Tests: Recent Labs  Lab 12/05/17 1449 12/06/17 0740 12/07/17 0346 12/08/17 0449 12/08/17 0836 12/10/17 0800  AST 24  --  22 19  --   --   ALT 22  --  19 20  --   --   ALKPHOS 69  --  60 65  --   --   BILITOT 0.3  --  0.4 0.6  --   --   PROT 5.3*  --  5.2* 5.4*  --   --   ALBUMIN 2.4* 2.5* 2.6*  2.5* 2.6* 2.6* 2.6*   Recent Labs  Lab 12/05/17 1449  LIPASE 19   No results for input(s): AMMONIA in the last 168 hours. Coagulation Profile: Recent Labs  Lab 12/05/17 2338  INR 1.30   Cardiac Enzymes: Recent Labs  Lab 12/05/17 2338 12/06/17 0422 12/06/17 1432  12/08/17 1601 12/11/17 0746  TROPONINI 0.74* 0.89* 1.25* 0.57* 0.38*   BNP (last 3 results) No results for input(s): PROBNP in the last 8760 hours. HbA1C: No results for input(s): HGBA1C in the last 72 hours. CBG: Recent Labs  Lab 12/10/17 1632 12/10/17 2051 12/11/17 0023 12/11/17 0444 12/11/17 0739  GLUCAP 154* 156* 129* 150* 145*   Lipid Profile: No results for input(s): CHOL, HDL, LDLCALC, TRIG, CHOLHDL, LDLDIRECT in the last 72 hours. Thyroid Function Tests: No results for input(s): TSH, T4TOTAL, FREET4, T3FREE, THYROIDAB in the last 72 hours. Anemia Panel: No results for input(s): VITAMINB12, FOLATE, FERRITIN, TIBC, IRON, RETICCTPCT in the last 72 hours. Sepsis Labs: No results for input(s): PROCALCITON, LATICACIDVEN in the last 168 hours.  Recent Results (from the past 240 hour(s))  Blood culture (routine x 2)     Status: None   Collection Time: 12/05/17  3:56 PM  Result Value Ref Range Status   Specimen Description BLOOD LEFT ANTECUBITAL  Final   Special Requests   Final    BOTTLES DRAWN AEROBIC AND ANAEROBIC Blood Culture adequate volume   Culture   Final    NO GROWTH 5 DAYS Performed at [facility], [address], [place], [place] [address]    Report Status 12/10/2017 FINAL  Final  Blood culture (routine x 2)     Status: None   Collection Time: 12/05/17  4:10 PM  Result Value Ref Range Status   Specimen Description BLOOD LEFT FOREARM  Final   Special Requests   Final    BOTTLES DRAWN AEROBIC ONLY Blood Culture results may not be optimal due to an inadequate volume of blood received in culture bottles   Culture   Final    NO GROWTH 5 DAYS Performed at [facility], [place] [address] [place], [place] [address]    Report Status 12/10/2017 FINAL  Final  MRSA PCR Screening     Status: None   Collection Time: 12/05/17  9:14 PM  Result Value Ref Range Status   MRSA by PCR NEGATIVE NEGATIVE Final    Comment:        The GeneXpert MRSA Assay  (FDA approved for NASAL specimens only), is one component of a comprehensive MRSA colonization surveillance program. It is not intended to diagnose MRSA infection nor to guide or monitor treatment for MRSA infections. Performed at [facility], [place] [address], [place], [place] [address]          Radiology Studies: No results found.  Scheduled Meds: . amoxicillin-clavulanate  1 tablet Oral Daily  . atorvastatin  20 mg Oral q1800  . busPIRone  2.5 mg Oral BID  . Chlorhexidine Gluconate Cloth  6 each Topical Q0600  . clopidogrel  75 mg Oral Daily  . collagenase   Topical Daily  . gabapentin  100 mg Oral QHS  . insulin aspart  0-15 Units Subcutaneous Q4H  . insulin glargine  5 Units Subcutaneous QHS  . isosorbide-hydrALAZINE  1 tablet Oral BID  . metolazone  2.5 mg Oral Daily  . midodrine  5 mg Oral TID WC  . pantoprazole  40 mg Oral BID AC  . potassium phosphate (monobasic)  500 mg Oral TID WC & HS   Continuous Infusions:   LOS: 6 days     Cordelia Poche, MD [facility] 12/11/2017, 10:34 AM  If 7PM-7AM, please contact night-coverage [email or web address]

## 2017-12-11 NOTE — Progress Notes (Signed)
S: No events overnight O:BP 129/65 (BP Location: Left Arm)   Pulse (!) 54   Temp 99 F (37.2 C) (Oral)   Resp 18   Ht 5\' 10"  (1.778 m)   Wt 47.3 kg   SpO2 98%   BMI 14.96 kg/m   Intake/Output Summary (Last 24 hours) at 12/11/2017 1102 Last data filed at 12/11/2017 0935 Gross per 24 hour  Intake 800 ml  Output 2000 ml  Net -1200 ml   Intake/Output: I/O last 3 completed shifts: In: 49 [P.O.:780; IV Piggyback:250] Out: 2050 [Urine:50; Other:2000]  Intake/Output this shift:  Total I/O In: 120 [P.O.:120] Out: 0  Weight change:  Gen: lethargic but arousable, oriented to person and place CVS: bradycardic Resp: cta Abd: benign Ext: RAVF +T/B, +edema of RUE. S/p RBKA  Recent Labs  Lab 12/05/17 1449  12/05/17 2338 12/06/17 0422 12/06/17 0740 12/07/17 0346 12/08/17 0449 12/08/17 0836 12/10/17 0800  NA 131*   < > 132* 133* 131* 133*  134* 134* 135 135  K 5.5*   < > 6.0* 5.5* 5.2* 4.5  4.3 4.8 4.8 3.2*  CL 95*   < > 98 99 97* 100  103 101 103 101  CO2 25  --  19* 23 21* 23  23 21* 23 28  GLUCOSE 189*   < > 202* 205* 181* 188*  184* 151* 123* 120*  BUN 88*   < > 89* 95* 81* 56*  53* 64* 64* 20  CREATININE 4.03*   < > 4.13* 4.53* 3.76* 3.66*  3.44* 4.33* 4.24* 2.29*  ALBUMIN 2.4*  --   --   --  2.5* 2.6*  2.5* 2.6* 2.6* 2.6*  CALCIUM 7.7*  --  7.7* 8.0* 8.0* 8.4*  7.8* 8.2* 8.2* 7.9*  PHOS  --   --   --  4.2 4.0 3.8  --  6.4* 2.1*  AST 24  --   --   --   --  22 19  --   --   ALT 22  --   --   --   --  19 20  --   --    < > = values in this interval not displayed.   Liver Function Tests: Recent Labs  Lab 12/05/17 1449  12/07/17 0346 12/08/17 0449 12/08/17 0836 12/10/17 0800  AST 24  --  22 19  --   --   ALT 22  --  19 20  --   --   ALKPHOS 69  --  60 65  --   --   BILITOT 0.3  --  0.4 0.6  --   --   PROT 5.3*  --  5.2* 5.4*  --   --   ALBUMIN 2.4*   < > 2.6*  2.5* 2.6* 2.6* 2.6*   < > = values in this interval not displayed.   Recent Labs  Lab  12/05/17 1449  LIPASE 19   No results for input(s): AMMONIA in the last 168 hours. CBC: Recent Labs  Lab 12/05/17 1449  12/08/17 0449 12/08/17 0840 12/09/17 0522 12/10/17 0538 12/10/17 0800  WBC 13.1*   < > 13.7* 13.5* 9.8 11.2* 10.2  NEUTROABS 9.3*  --   --   --   --   --   --   HGB 5.1*   < > 7.6* 7.7* 7.4* 8.1* 7.9*  HCT 17.4*   < > 25.8* 25.5* 25.6* 27.0* 26.7*  MCV 100.0   < > 101.2*  101.2* 104.1* 101.9* 101.9*  PLT 344   < > 232 222 203 260 223   < > = values in this interval not displayed.   Cardiac Enzymes: Recent Labs  Lab 12/05/17 2338 12/06/17 0422 12/06/17 1432 12/08/17 1601 12/11/17 0746  TROPONINI 0.74* 0.89* 1.25* 0.57* 0.38*   CBG: Recent Labs  Lab 12/10/17 1632 12/10/17 2051 12/11/17 0023 12/11/17 0444 12/11/17 0739  GLUCAP 154* 156* 129* 150* 145*    Iron Studies: No results for input(s): IRON, TIBC, TRANSFERRIN, FERRITIN in the last 72 hours. Studies/Results: No results found. [name] amoxicillin-clavulanate  1 tablet Oral Daily  . atorvastatin  20 mg Oral q1800  . busPIRone  2.5 mg Oral BID  . Chlorhexidine Gluconate Cloth  6 each Topical Q0600  . clopidogrel  75 mg Oral Daily  . collagenase   Topical Daily  . gabapentin  100 mg Oral QHS  . insulin aspart  0-15 Units Subcutaneous Q4H  . insulin glargine  5 Units Subcutaneous QHS  . isosorbide-hydrALAZINE  1 tablet Oral BID  . metolazone  2.5 mg Oral Daily  . midodrine  5 mg Oral TID WC  . pantoprazole  40 mg Oral BID AC  . potassium phosphate (monobasic)  500 mg Oral TID WC & HS    BMET    Component Value Date/Time   NA 135 12/10/2017 0800   NA 144 11/01/2016 1532   K 3.2 (L) 12/10/2017 0800   CL 101 12/10/2017 0800   CO2 28 12/10/2017 0800   GLUCOSE 120 (H) 12/10/2017 0800   BUN 20 12/10/2017 0800   BUN 66 (H) 11/01/2016 1532   CREATININE 2.29 (H) 12/10/2017 0800   CALCIUM 7.9 (L) 12/10/2017 0800   CALCIUM 8.2 (L) 05/19/2017 0230   GFRNONAA 26 (L) 12/10/2017 0800   GFRAA 31  (L) 12/10/2017 0800   CBC    Component Value Date/Time   WBC 10.2 12/10/2017 0800   RBC 2.62 (L) 12/10/2017 0800   HGB 7.9 (L) 12/10/2017 0800   HGB 8.7 (L) 11/01/2016 1532   HCT 26.7 (L) 12/10/2017 0800   HCT 26.6 (L) 11/01/2016 1532   PLT 223 12/10/2017 0800   PLT 328 11/01/2016 1532   MCV 101.9 (H) 12/10/2017 0800   MCV 92 11/01/2016 1532   MCH 30.2 12/10/2017 0800   MCHC 29.6 (L) 12/10/2017 0800   RDW 19.2 (H) 12/10/2017 0800   RDW 15.3 11/01/2016 1532   LYMPHSABS 1.9 12/05/2017 1449   MONOABS 1.7 (H) 12/05/2017 1449   EOSABS 0.1 12/05/2017 1449   BASOSABS 0.0 12/05/2017 1449    Assessment/Plan:72 year old white male with multiple medical problems including ESRD and previous GI bleed, now presenting with a hemoglobin of 5.1and GI sxms 1. Severe anemia/GIB-do not know what recent hemoglobin has been. Feature of hospitalization in July was GI bleed status post scopes showing ulcers, polyps and an oozing AVM. GI has already been consultedand s/p EGD which revealed non-bleeding duodenal ulcer and probable AVM's. Received2 units for hemoglobin 5.1.Reportedly on Plavixas OP.  Continue with ESA and transfuse prn.   2. ESRD:Normally TTS at the [facility]. Using PermCath but AV fistula is close for use.Will continue with outpatient schedule-no heparin.  3. Hypertension:Was hypotensive in the emergency departmentbut improved after blood. currently stable 4. Anemia of ESRD:Appropriate response following transfusions.Will attempt to find out when ESA was dosed at outpatient kidney center but likely will re-dose here 5. Metabolic Bone Disease:Medication list includes Sensipar 30 daily and Renvela. Will resume once GI  issues resolved 6. Psych-pt seems very depressed asking "is this all worth it" Stating that 4 hours of dialysis is torture, makes him feel terrible- consider a palliative care consult while in house 7. Left adrenal mass- f/u as  outpatient. 8. Disposition- per primary service, for SNF placement ([facility])  Donetta Potts, MD [facility] [phone]

## 2017-12-11 NOTE — Progress Notes (Signed)
Pt is finally calming down. Will continue to monitor.   Eleanora Neighbor, RN

## 2017-12-12 LAB — RENAL FUNCTION PANEL
Albumin: 2.5 g/dL — ABNORMAL LOW (ref 3.5–5.0)
Anion gap: 8 (ref 5–15)
BUN: 32 mg/dL — AB (ref 8–23)
CO2: 26 mmol/L (ref 22–32)
CREATININE: 4.03 mg/dL — AB (ref 0.61–1.24)
Calcium: 8.2 mg/dL — ABNORMAL LOW (ref 8.9–10.3)
Chloride: 101 mmol/L (ref 98–111)
GFR, EST AFRICAN AMERICAN: 15 mL/min — AB (ref >60)
GFR, EST NON AFRICAN AMERICAN: 13 mL/min — AB (ref >60)
Glucose, Bld: 140 mg/dL — ABNORMAL HIGH (ref 70–99)
Phosphorus: 6.4 mg/dL — ABNORMAL HIGH (ref 2.5–4.6)
Potassium: 4.2 mmol/L (ref 3.5–5.1)
SODIUM: 135 mmol/L (ref 135–145)

## 2017-12-12 LAB — CBC
HEMATOCRIT: 27.7 % — AB (ref 39.0–52.0)
HEMOGLOBIN: 8.2 g/dL — AB (ref 13.0–17.0)
MCH: 29.9 pg (ref 26.0–34.0)
MCHC: 29.6 g/dL — ABNORMAL LOW (ref 30.0–36.0)
MCV: 101.1 fL — AB (ref 80.0–100.0)
Platelets: 227 10*3/uL (ref 150–400)
RBC: 2.74 MIL/uL — ABNORMAL LOW (ref 4.22–5.81)
RDW: 19.2 % — AB (ref 11.5–15.5)
WBC: 13 10*3/uL — ABNORMAL HIGH (ref 4.0–10.5)
nRBC: 0 % (ref 0.0–0.2)

## 2017-12-12 LAB — GLUCOSE, CAPILLARY
Glucose-Capillary: 131 mg/dL — ABNORMAL HIGH (ref 70–99)
Glucose-Capillary: 118 mg/dL — ABNORMAL HIGH (ref 70–99)
Glucose-Capillary: 161 mg/dL — ABNORMAL HIGH (ref 70–99)
Glucose-Capillary: 204 mg/dL — ABNORMAL HIGH (ref 70–99)
Glucose-Capillary: 130 mg/dL — ABNORMAL HIGH (ref 70–99)

## 2017-12-12 MED ORDER — CALCIUM ACETATE (PHOS BINDER) 667 MG PO CAPS
1334.0000 mg | ORAL_CAPSULE | Freq: Three times a day (TID) | ORAL | Status: DC
  Administered 2017-12-12 – 2017-12-14 (×6): 1334 mg via ORAL
  Filled 2017-12-12 (×7): qty 2

## 2017-12-12 MED ORDER — CHLORHEXIDINE GLUCONATE CLOTH 2 % EX PADS: 6.0000 | MEDICATED_PAD | Freq: Every day | CUTANEOUS | Status: DC

## 2017-12-12 NOTE — Progress Notes (Signed)
Posey belt restraint removed. Patient is calm and cooperative. Dorthey Sawyer, RN

## 2017-12-12 NOTE — Progress Notes (Signed)
PROGRESS NOTE    Mitchell Garcia  ASN:[id number] DOB: 1945/06/29 DOA: 12/05/2017 PCP: [name], MD   Brief Narrative: Mitchell Garcia is a 72 y.o. male with a history of CAD, ESRD on HD, chronic anemia, CHF. He presented secondary to abdominal pain, nausea and weakness found to have a hemoglobin of 5.1 secondary to presumed upper GI bleeding. He suffered hemorrhagic shock requiring transfusion and critical care admission. EGD performed and significant for a non-bleeding duodenal ulcer.   Assessment & Plan:   Principal Problem:   GI bleed Active Problems:   Anemia of chronic disease   DM (diabetes mellitus), type 2 with renal complications (HCC)   Acute blood loss anemia   ESRD on dialysis (HCC)   Pleural effusion, bilateral   Left adrenal mass (HCC)   Poor venous access   Chronic systolic CHF (congestive heart failure) (HCC)   Demand ischemia (HCC)   Pneumonia   Acute GI bleed Symptomatic anemia No definitive source but possibly secondary to a duodenal ulcer seen on EGD vs AVMs. GI currently signed off -Continue protonix -Aspirin discontinued  Somnolence Excessive. On chart review, patient receiving scheduled oxycodone. Oxycodone listed on allergy list for "difficulty to wake up." Also received morphine IV. Resolved with cessation of narcotics. No narcotics on discharge.  Chest pain Patient with a history of PCI with stent placement; patient on plavix and aspirin as an outpatient. Overnight episode sounds more like epigastric pain. Troponin obtained and trending down from previous. EKG without evidence of ACS. -Continue Plavix -Continue BiDil  Pneumonia Patient treated empirically with Vancomycin/Zosyn. Switched to Augmentin -Continue Augmentin  Demand ischemia Patient evaluated by cardiology.   Chronic systolic heart failure EF of 25-30%. -Dialysis for fluid balance -Continue BiDil; Coreg held  Hypotension Chronic. -Continue midodrine  ESRD on  HD TTS -Per nephrology  Diabetes mellitus, type 2 -Continue Lantus 5 units and SSI  Chronic left calf wound Right AKA -Continue wound care   DVT prophylaxis: SCDs Code Status:   Code Status: DNR Family Communication: Wife on telephone Disposition Plan: Discharge to SNF in 24-48 hours if back to baseline   Consultants:   Gastroenterology  Cardiology  Procedures:   EGD (11/13) Impression:               - Normal larynx.                           - Tiny hiatal hernia.                           - Normal stomach.                           - One non-bleeding duodenal ulcer with pigmented                            material.                           - Normal duodenal bulb, second portion of the                            duodenum, major papilla, third portion of the  duodenum and fourth portion of the duodenum.                           - Normal examined jejunum.                           - The examination was otherwise normal.                           - No specimens collected. Recommendation:           - Patient has a contact number available for                            emergencies. The signs and symptoms of potential                            delayed complications were discussed with the                            patient. Return to normal activities tomorrow.                            Written discharge instructions were provided to the                            patient.                           - Soft diet today.                           - Continue present medications. He may be on                            aspirin and Plavix at home and will reevaluate his                            aspirin use and I think when he goes to the [place] or                            [facility] hospital they may restart his medicines and                            I do not believe he was on pump inhibitors at home                            which is probably why  his ulcer has not healed                           - Return to GI clinic PRN. Continue once a day pump                            inhibitor at home   Transthoracic Echocardiogram (  11/14) Study Conclusions  - Left ventricle: The cavity size was normal. Wall thickness was   increased in a pattern of mild LVH. Systolic function was   severely reduced. The estimated ejection fraction was in the   range of 25% to 30%. Akinesis of the basal-midinferior   myocardium. - Aortic valve: There was mild stenosis. Valve area (Vmax): 3.07   cm^2. - Left atrium: The atrium was mildly dilated. - Right ventricle: The cavity size was moderately dilated. - Right atrium: The atrium was moderately dilated. - Pulmonary arteries: Systolic pressure was moderately increased.   PA peak pressure: 44 mm Hg (S).  Antimicrobials:  Vancomycin  Zosyn  Augmentin    Subjective: No issues overnight. Singing music.  Objective: Vitals:   12/11/17 2100 12/12/17 0255 12/12/17 0454 12/12/17 0746  BP: (!) 124/45  121/69 121/70  Pulse: (!) 59  (!) 40 91  Resp: 18  18 18   Temp: 98.6 F (37 C)  98.3 F (36.8 C) 97.6 F (36.4 C)  TempSrc: Oral  Oral Oral  SpO2:   100% 100%  Weight:  47.3 kg    Height:        Intake/Output Summary (Last 24 hours) at 12/12/2017 1620 Last data filed at 12/12/2017 1300 Gross per 24 hour  Intake 476 ml  Output 0 ml  Net 476 ml   Filed Weights   12/10/17 2052 12/11/17 0414 12/12/17 0255  Weight: 47.3 kg 47.3 kg 47.3 kg    Examination:  General exam: Appears calm and comfortable Respiratory system: Clear to auscultation. Respiratory effort normal. Cardiovascular system: S1 & S2 heard, RRR. No murmurs, rubs, gallops or clicks. Gastrointestinal system: Abdomen is nondistended, soft and nontender. No organomegaly or masses felt. Normal bowel sounds heard. Central nervous system: Alert and oriented. No focal neurological deficits. Extremities: No edema. No calf  tenderness Skin: No cyanosis. No rashes Psychiatry: Judgement and insight appear normal. Mood & affect appropriate.    Data Reviewed: I have personally reviewed following labs and imaging studies  CBC: Recent Labs  Lab 12/08/17 0840 12/09/17 0522 12/10/17 0538 12/10/17 0800 12/12/17 0544  WBC 13.5* 9.8 11.2* 10.2 13.0*  HGB 7.7* 7.4* 8.1* 7.9* 8.2*  HCT 25.5* 25.6* 27.0* 26.7* 27.7*  MCV 101.2* 104.1* 101.9* 101.9* 101.1*  PLT 222 203 260 223 536   Basic Metabolic Panel: Recent Labs  Lab 12/05/17 2338  12/06/17 0422 12/06/17 0740 12/07/17 0346 12/08/17 0449 12/08/17 0836 12/10/17 0800 12/12/17 0544  NA 132*  --  133* 131* 133*  134* 134* 135 135 135  K 6.0*  --  5.5* 5.2* 4.5  4.3 4.8 4.8 3.2* 4.2  CL 98  --  99 97* 100  103 101 103 101 101  CO2 19*  --  23 21* 23  23 21* 23 28 26   GLUCOSE 202*  --  205* 181* 188*  184* 151* 123* 120* 140*  BUN 89*  --  95* 81* 56*  53* 64* 64* 20 32*  CREATININE 4.13*  --  4.53* 3.76* 3.66*  3.44* 4.33* 4.24* 2.29* 4.03*  CALCIUM 7.7*  --  8.0* 8.0* 8.4*  7.8* 8.2* 8.2* 7.9* 8.2*  MG 2.3  --  2.3  --   --   --   --   --   --   PHOS  --    < > 4.2 4.0 3.8  --  6.4* 2.1* 6.4*   < > = values in this interval not displayed.  GFR: Estimated Creatinine Clearance: 10.8 mL/min (A) (by C-G formula based on SCr of 4.03 mg/dL (H)). Liver Function Tests: Recent Labs  Lab 12/07/17 0346 12/08/17 0449 12/08/17 0836 12/10/17 0800 12/12/17 0544  AST 22 19  --   --   --   ALT 19 20  --   --   --   ALKPHOS 60 65  --   --   --   BILITOT 0.4 0.6  --   --   --   PROT 5.2* 5.4*  --   --   --   ALBUMIN 2.6*  2.5* 2.6* 2.6* 2.6* 2.5*   No results for input(s): LIPASE, AMYLASE in the last 168 hours. No results for input(s): AMMONIA in the last 168 hours. Coagulation Profile: Recent Labs  Lab 12/05/17 2338  INR 1.30   Cardiac Enzymes: Recent Labs  Lab 12/05/17 2338 12/06/17 0422 12/06/17 1432 12/08/17 1601 12/11/17 0746   TROPONINI 0.74* 0.89* 1.25* 0.57* 0.38*   CBG: Recent Labs  Lab 12/11/17 1625 12/11/17 2102 12/12/17 0453 12/12/17 0753 12/12/17 1127  GLUCAP 135* 211* 131* 118* 161*     Recent Results (from the past 240 hour(s))  Blood culture (routine x 2)     Status: None   Collection Time: 12/05/17  3:56 PM  Result Value Ref Range Status   Specimen Description BLOOD LEFT ANTECUBITAL  Final   Special Requests   Final    BOTTLES DRAWN AEROBIC AND ANAEROBIC Blood Culture adequate volume   Culture   Final    NO GROWTH 5 DAYS Performed at [facility], [place] [address] [place], [place] [address]    Report Status 12/10/2017 FINAL  Final  Blood culture (routine x 2)     Status: None   Collection Time: 12/05/17  4:10 PM  Result Value Ref Range Status   Specimen Description BLOOD LEFT FOREARM  Final   Special Requests   Final    BOTTLES DRAWN AEROBIC ONLY Blood Culture results may not be optimal due to an inadequate volume of blood received in culture bottles   Culture   Final    NO GROWTH 5 DAYS Performed at [facility], [place] [address] [place], [place] [address]    Report Status 12/10/2017 FINAL  Final  MRSA PCR Screening     Status: None   Collection Time: 12/05/17  9:14 PM  Result Value Ref Range Status   MRSA by PCR NEGATIVE NEGATIVE Final    Comment:        The GeneXpert MRSA Assay (FDA approved for NASAL specimens only), is one component of a comprehensive MRSA colonization surveillance program. It is not intended to diagnose MRSA infection nor to guide or monitor treatment for MRSA infections. Performed at [facility], [place] [address] [place], [place] [address]          Radiology Studies: Dg Chest Port 1 View  Result Date: 12/11/2017 CLINICAL DATA:  Decreased bowel sounds. EXAM: PORTABLE CHEST 1 VIEW COMPARISON:  12/06/2017. FINDINGS: Stable enlarged cardiac silhouette. Stable right jugular catheter. No significant change in bilateral pleural  effusions and prominence of the pulmonary vasculature and interstitial markings. Decreased right basilar airspace opacity and stable left basilar airspace opacity. Thoracic spine degenerative changes. IMPRESSION: 1. Stable changes of congestive heart failure. 2. Decreased right basilar atelectasis or alveolar edema and stable left basilar atelectasis or pneumonia. Electronically Signed   By: Claudie Revering M.D.   On: 12/11/2017 12:58  Scheduled Meds: . amoxicillin-clavulanate  1 tablet Oral Daily  . atorvastatin  20 mg Oral q1800  . busPIRone  2.5 mg Oral BID  . calcium acetate  1,334 mg Oral TID WC  . Chlorhexidine Gluconate Cloth  6 each Topical Q0600  . Chlorhexidine Gluconate Cloth  6 each Topical Q0600  . clopidogrel  75 mg Oral Daily  . collagenase   Topical Daily  . gabapentin  100 mg Oral QHS  . insulin aspart  0-15 Units Subcutaneous Q4H  . insulin glargine  5 Units Subcutaneous QHS  . isosorbide-hydrALAZINE  1 tablet Oral BID  . midodrine  5 mg Oral TID WC  . pantoprazole  40 mg Oral BID AC   Continuous Infusions:   LOS: 7 days     Cordelia Poche, MD [facility] 12/12/2017, 4:20 PM  If 7PM-7AM, please contact night-coverage [email or web address]

## 2017-12-12 NOTE — Care Management Important Message (Signed)
Important Message  Patient Details  Name: Mitchell Garcia MRN: [id number] Date of Birth: Jun 12, 1945   Medicare Important Message Given:  Yes    [name] 12/12/2017, 4:03 PM

## 2017-12-12 NOTE — Progress Notes (Signed)
[name], male   DOB: 05/05/1945, 72 y.o.   MRN: [id number] [facility] Progress Note   Assessment/ Plan:   1.  Acute blood loss anemia/gastrointestinal bleed: No available data regarding his most recent hemoglobin/hematocrit and recent EGD showed nonbleeding duodenal ulcer with probable arteriovenous malformations.  He has been appropriately treated with PRBC transfusions and remains on pantoprazole.  Continue ESA with periodic iron evaluation/supplementation. 2. ESRD: Continue hemodialysis on a Tuesday/Thursday/Saturday schedule without heparin.  Will discontinue metolazone. 3. CKD-MBD: Hyperphosphatemia noted, begin calcium acetate 1334 mg 3 times daily AC. 4. Nutrition: Continue renal diet/ONS. 5. Hypertension: Blood pressure appears to be fairly sustained on current doses of Midodrin.  Subjective:   Reports to be feeling fair-denies any acute events overnight.  Some confusion/agitation noted last night.   Objective:   BP 121/70 (BP Location: Left Arm)   Pulse 91   Temp 97.6 F (36.4 C) (Oral)   Resp 18   Ht 5\' 10"  (1.778 m)   Wt 47.3 kg   SpO2 100%   BMI 14.96 kg/m   Physical Exam: Gen: Comfortably resting in bed, appears appropriate during conversation. CVS: Pulse regular rhythm, S1 and S2 with ejection systolic murmur Resp: Clear to auscultation, no distinct rales or rhonchi Abd: Soft, obese, nontender Ext: Left leg 2+ edema, status post right AKA.  Significant 2+ right upper extremity edema ipsilateral to RCF.  Labs: BMET Recent Labs  Lab 12/06/17 0422 12/06/17 0740 12/07/17 0346 12/08/17 0449 12/08/17 0836 12/10/17 0800 12/12/17 0544  NA 133* 131* 133*  134* 134* 135 135 135  K 5.5* 5.2* 4.5  4.3 4.8 4.8 3.2* 4.2  CL 99 97* 100  103 101 103 101 101  CO2 23 21* 23  23 21* 23 28 26   GLUCOSE 205* 181* 188*  184* 151* 123* 120* 140*  BUN 95* 81* 56*  53* 64* 64* 20 32*  CREATININE 4.53* 3.76* 3.66*  3.44* 4.33* 4.24* 2.29*  4.03*  CALCIUM 8.0* 8.0* 8.4*  7.8* 8.2* 8.2* 7.9* 8.2*  PHOS 4.2 4.0 3.8  --  6.4* 2.1* 6.4*   CBC Recent Labs  Lab 12/05/17 1449  12/09/17 0522 12/10/17 0538 12/10/17 0800 12/12/17 0544  WBC 13.1*   < > 9.8 11.2* 10.2 13.0*  NEUTROABS 9.3*  --   --   --   --   --   HGB 5.1*   < > 7.4* 8.1* 7.9* 8.2*  HCT 17.4*   < > 25.6* 27.0* 26.7* 27.7*  MCV 100.0   < > 104.1* 101.9* 101.9* 101.1*  PLT 344   < > 203 260 223 227   < > = values in this interval not displayed.   Medications:    . amoxicillin-clavulanate  1 tablet Oral Daily  . atorvastatin  20 mg Oral q1800  . busPIRone  2.5 mg Oral BID  . Chlorhexidine Gluconate Cloth  6 each Topical Q0600  . clopidogrel  75 mg Oral Daily  . collagenase   Topical Daily  . gabapentin  100 mg Oral QHS  . insulin aspart  0-15 Units Subcutaneous Q4H  . insulin glargine  5 Units Subcutaneous QHS  . isosorbide-hydrALAZINE  1 tablet Oral BID  . metolazone  2.5 mg Oral Daily  . midodrine  5 mg Oral TID WC  . pantoprazole  40 mg Oral BID AC   Elmarie Shiley, MD 12/12/2017, 10:34 AM

## 2017-12-13 LAB — RENAL FUNCTION PANEL
Albumin: 2.5 g/dL — ABNORMAL LOW (ref 3.5–5.0)
Anion gap: 11 (ref 5–15)
BUN: 43 mg/dL — ABNORMAL HIGH (ref 8–23)
CO2: 25 mmol/L (ref 22–32)
Calcium: 8.4 mg/dL — ABNORMAL LOW (ref 8.9–10.3)
Chloride: 98 mmol/L (ref 98–111)
Creatinine, Ser: 5.14 mg/dL — ABNORMAL HIGH (ref 0.61–1.24)
GFR calc Af Amer: 12 mL/min — ABNORMAL LOW
GFR calc non Af Amer: 10 mL/min — ABNORMAL LOW
Glucose, Bld: 147 mg/dL — ABNORMAL HIGH (ref 70–99)
Phosphorus: 7.4 mg/dL — ABNORMAL HIGH (ref 2.5–4.6)
Potassium: 4.6 mmol/L (ref 3.5–5.1)
Sodium: 134 mmol/L — ABNORMAL LOW (ref 135–145)

## 2017-12-13 LAB — GLUCOSE, CAPILLARY
GLUCOSE-CAPILLARY: 128 mg/dL — AB (ref 70–99)
GLUCOSE-CAPILLARY: 228 mg/dL — AB (ref 70–99)
Glucose-Capillary: 121 mg/dL — ABNORMAL HIGH (ref 70–99)
Glucose-Capillary: 128 mg/dL — ABNORMAL HIGH (ref 70–99)
Glucose-Capillary: 134 mg/dL — ABNORMAL HIGH (ref 70–99)
Glucose-Capillary: 145 mg/dL — ABNORMAL HIGH (ref 70–99)

## 2017-12-13 LAB — CBC
HEMATOCRIT: 27.6 % — AB (ref 39.0–52.0)
Hemoglobin: 7.9 g/dL — ABNORMAL LOW (ref 13.0–17.0)
MCH: 29.5 pg (ref 26.0–34.0)
MCHC: 28.6 g/dL — AB (ref 30.0–36.0)
MCV: 103 fL — AB (ref 80.0–100.0)
NRBC: 0 % (ref 0.0–0.2)
PLATELETS: 289 10*3/uL (ref 150–400)
RBC: 2.68 MIL/uL — ABNORMAL LOW (ref 4.22–5.81)
RDW: 18.4 % — AB (ref 11.5–15.5)
WBC: 14.9 10*3/uL — AB (ref 4.0–10.5)

## 2017-12-13 MED ORDER — GABAPENTIN 100 MG PO CAPS: 100.0000 mg | ORAL_CAPSULE | Freq: Every day | ORAL | Status: AC

## 2017-12-13 MED ORDER — ATORVASTATIN CALCIUM 20 MG PO TABS: 20.0000 mg | ORAL_TABLET | Freq: Every day | ORAL | Status: AC

## 2017-12-13 MED ORDER — BISACODYL 10 MG RE SUPP
10.0000 mg | Freq: Once | RECTAL | Status: AC
  Administered 2017-12-13: 10 mg via RECTAL
  Filled 2017-12-13: qty 1

## 2017-12-13 MED ORDER — HEPARIN SODIUM (PORCINE) 1000 UNIT/ML IJ SOLN
INTRAMUSCULAR | Status: AC
  Filled 2017-12-13: qty 4

## 2017-12-13 MED ORDER — HEPARIN SODIUM (PORCINE) 1000 UNIT/ML IJ SOLN
3200.0000 [IU] | Freq: Once | INTRAMUSCULAR | Status: AC
  Administered 2017-12-13: 3200 [IU]

## 2017-12-13 MED ORDER — ACETAMINOPHEN 325 MG PO TABS
ORAL_TABLET | ORAL | Status: AC
  Filled 2017-12-13: qty 2

## 2017-12-13 MED ORDER — PANTOPRAZOLE SODIUM 40 MG PO TBEC: 40.0000 mg | DELAYED_RELEASE_TABLET | Freq: Every day | ORAL | Status: AC

## 2017-12-13 MED ORDER — AMOXICILLIN-POT CLAVULANATE 500-125 MG PO TABS: 1.0000 | ORAL_TABLET | Freq: Every day | ORAL | 0 refills | Status: AC

## 2017-12-13 MED ORDER — MIDODRINE HCL 5 MG PO TABS
ORAL_TABLET | ORAL | Status: AC
  Filled 2017-12-13: qty 1

## 2017-12-13 MED ORDER — ALPRAZOLAM 0.5 MG PO TABS: 0.5000 mg | ORAL_TABLET | Freq: Every evening | ORAL | 0 refills | Status: DC | PRN

## 2017-12-13 NOTE — Discharge Summary (Signed)
Physician Discharge Summary  PAOLO OKANE VHQ:[id number] DOB: Jul 11, 1945 DOA: 12/05/2017  PCP: [name], MD  Admit date: 12/05/2017 Discharge date: 12/13/2017  Admitted From: SNF Disposition: SNF  Recommendations for Outpatient Follow-up:  1. Follow up with PCP in 1 week 2. Please obtain BMP/CBC in one week 3. Follow up with outpatient vascular surgery and gastroenterology 4. Please follow up on the following pending results: None  Home Health: SNF Equipment/Devices: SNF  Discharge Condition: Stable CODE STATUS: DNR Diet recommendation: Soft diet   Brief/Interim Summary:  Admission HPI written by Drenda Freeze, MD   History of Present Illness   Mitchell Garcia is a 71 y.o. Caucasian male nursing home resident ([place]) who presents to [facility] emergency department with abdominal pain, nausea and weakness.  Symptoms apparently worsened earlier this morning; however, abd pain had been going on for 3 weeks.  In ED, he had borderline hypotension.  He was evaluated by GI. CT scan was recommended along with treatment with proton pump inhibitor or H2 blocker. While getting CT, SBP dropped to 60s.  He was treated with IVF boluses and 40 mcg of Neosynephrine.  Hgb was found to be 5.1, for which 2 units PRBC were ordered.  He denies any recent hematemesis, hematochezia, melena.  He denies any recent ASA or NSAID use.  He does take 81 mg ASA daily as well as 75 mg Plavix.  Due to hypotension and significant anemia, PCCM was asked to evaluate for admission.  GI was consulted and recommended CT scan and PPI or H2 blockers.  Nephrology also consulted given ESRD on HD TTS.  He was seen on a previous admission by GI and had EGD / colonoscopy on 08/08/17.  These showed small hiatal hernia, few non bleeding superficial duodenal ulcers.   Hospital course:  Acute GI bleed Symptomatic anemia No definitive source but possibly secondary to a duodenal ulcer  seen on EGD vs AVMs. S/p 3 units of PRBC. GI recommending to continue aspirin and Plavix in addition to starting PPI daily. Patient will need to follow-up with outpatient GI.  Somnolence Excessive. On chart review, patient receiving scheduled oxycodone. Oxycodone listed on allergy list for "difficulty to wake up." Also received morphine IV. Resolved with cessation of narcotics. No narcotics on discharge.  Chest pain Patient with a history of PCI with stent placement; patient on plavix and aspirin as an outpatient. Overnight episode sounds more like epigastric pain. Troponin obtained and trending down from previous. EKG without evidence of ACS. Continued Plavix and BiDil  Pneumonia Patient treated empirically with Vancomycin/Zosyn. Switched to Augmentin. Continue Augmentin for one more day (7 day total treatment)  Demand ischemia Patient evaluated by cardiology.   Chronic systolic heart failure EF of 25-30%. Dialysis for fluid balance. Continued BiDil; Coreg held  Hypotension Chronic. Continued midodrine  ESRD on HD Tuesday, Thursday, Saturday per nephrology. Tunneled dialysis catheter exchanged on 11/19. Recommend outpatient vascular surgery follow-up.  Diabetes mellitus, type 2 Continued Lantus 5 units and SSI  Chronic left calf wound Right AKA Continued wound care  Discharge Diagnoses:  Principal Problem:   GI bleed Active Problems:   Anemia of chronic disease   DM (diabetes mellitus), type 2 with renal complications (HCC)   Acute blood loss anemia   ESRD on dialysis ([place])   Pleural effusion, bilateral   Left adrenal mass (HCC)   Poor venous access   Chronic systolic CHF (congestive heart failure) ([place])   Demand ischemia ([place])  Pneumonia    Discharge Instructions   Allergies as of 12/13/2017      Reactions   Oxycodone Other (See Comments)   DIFFICULT TO WAKE UP   Tramadol Nausea And Vomiting   Lisinopril Other (See Comments)   Hyperkalemia    Losartan Other (See Comments)   Hyperkalemia      Medication List    STOP taking these medications   ciprofloxacin 250 MG tablet Commonly known as:  CIPRO   metolazone 2.5 MG tablet Commonly known as:  ZAROXOLYN   oxyCODONE 5 MG immediate release tablet Commonly known as:  Oxy IR/ROXICODONE   traMADol 50 MG tablet Commonly known as:  ULTRAM     TAKE these medications   acetaminophen 325 MG tablet Commonly known as:  TYLENOL Take 2 tablets (650 mg total) by mouth every 4 (four) hours as needed for mild pain, fever or headache.   ACETYL L-CARNITINE PO Take 2 capsules by mouth daily.   ALPRAZolam 0.5 MG tablet Commonly known as:  XANAX Take 1 tablet (0.5 mg total) by mouth at bedtime as needed for anxiety. What changed:    when to take this  reasons to take this  additional instructions   amoxicillin-clavulanate 500-125 MG tablet Commonly known as:  AUGMENTIN Take 1 tablet (500 mg total) by mouth daily for 1 day. Start taking on:  12/14/2017   aspirin EC 81 MG tablet Take 81 mg by mouth daily.   atorvastatin 20 MG tablet Commonly known as:  LIPITOR Take 1 tablet (20 mg total) by mouth daily at 6 PM. What changed:    medication strength  how much to take  when to take this   busPIRone 5 MG tablet Commonly known as:  BUSPAR Take 2.5 mg by mouth 2 (two) times daily.   Ca Carbonate-Mag Hydroxide 550-110 MG Chew Chew 1-2 tablets by mouth every 4 (four) hours as needed.   carvedilol 3.125 MG tablet Commonly known as:  COREG Take 3.125 mg by mouth 2 (two) times daily with a meal.   cinacalcet 30 MG tablet Commonly known as:  SENSIPAR Take 30 mg by mouth daily.   clopidogrel 75 MG tablet Commonly known as:  PLAVIX Take 75 mg by mouth daily.   Darbepoetin Alfa 60 MCG/0.3ML Sosy injection Commonly known as:  ARANESP Inject 60 mcg into the skin once a week.   DSS 100 MG Caps Take 100 mg by mouth 2 (two) times daily.   fluoruracil 0.5 %  cream Commonly known as:  CARAC Apply 1 application topically 2 (two) times daily as needed.   gabapentin 100 MG capsule Commonly known as:  NEURONTIN Take 1 capsule (100 mg total) by mouth at bedtime. What changed:    medication strength  how much to take   Insulin Detemir 100 UNIT/ML Pen Commonly known as:  LEVEMIR Inject 5 Units into the skin at bedtime.   isosorbide-hydrALAZINE 20-37.5 MG tablet Commonly known as:  BIDIL Take 1 tablet by mouth 2 (two) times daily.   ketoconazole 2 % cream Commonly known as:  NIZORAL Apply 1 application topically 2 (two) times daily as needed.   ketorolac 0.5 % ophthalmic solution Commonly known as:  ACULAR Place 1 drop into the left eye 4 (four) times daily as needed.   lactobacillus acidophilus Tabs tablet Take 1 tablet by mouth 2 (two) times daily. 250 mg   Melatonin 3 MG Caps Take 3 mg by mouth at bedtime as needed.   midodrine 5 MG tablet  Commonly known as:  PROAMATINE Take 5 mg by mouth 2 (two) times daily as needed (unknown).   multivitamin Tabs tablet Take 1 tablet by mouth at bedtime.   nitroGLYCERIN 0.4 MG SL tablet Commonly known as:  NITROSTAT Place 1 tablet (0.4 mg total) under the tongue every 5 (five) minutes x 3 doses as needed for chest pain.   ondansetron 4 MG tablet Commonly known as:  ZOFRAN Take 4 mg by mouth every 8 (eight) hours as needed for nausea or vomiting (x72 hours. If not effective after 2 doses, increase to 8 mg).   OXYGEN Inhale 2 L into the lungs as needed (shortness of breath).   pantoprazole 40 MG tablet Commonly known as:  PROTONIX Take 1 tablet (40 mg total) by mouth daily.   primidone 50 MG tablet Commonly known as:  MYSOLINE Take 100 mg by mouth daily.   sevelamer carbonate 800 MG tablet Commonly known as:  RENVELA Take 2 tablets (1,600 mg total) by mouth 3 (three) times daily with meals.   triamcinolone cream 0.1 % Commonly known as:  KENALOG Apply topically 2 (two) times  daily.   vitamin B-12 500 MCG tablet Commonly known as:  CYANOCOBALAMIN Take 1,000 mcg by mouth daily.   Vitamin D3 50 MCG (2000 UT) Tabs Take 2,000 Units by mouth daily.       Allergies  Allergen Reactions  . Oxycodone Other (See Comments)    DIFFICULT TO WAKE UP  . Tramadol Nausea And Vomiting  . Lisinopril Other (See Comments)    Hyperkalemia   . Losartan Other (See Comments)    Hyperkalemia     Consultations:  Gastroenterology  Cardiology  Interventional radiology   Procedures/Studies: Dg Chest Port 1 View  Result Date: 12/11/2017 CLINICAL DATA:  Decreased bowel sounds. EXAM: PORTABLE CHEST 1 VIEW COMPARISON:  12/06/2017. FINDINGS: Stable enlarged cardiac silhouette. Stable right jugular catheter. No significant change in bilateral pleural effusions and prominence of the pulmonary vasculature and interstitial markings. Decreased right basilar airspace opacity and stable left basilar airspace opacity. Thoracic spine degenerative changes. IMPRESSION: 1. Stable changes of congestive heart failure. 2. Decreased right basilar atelectasis or alveolar edema and stable left basilar atelectasis or pneumonia. Electronically Signed   By: Claudie Revering M.D.   On: 12/11/2017 12:58   Dg Chest Port 1 View  Result Date: 12/06/2017 CLINICAL DATA:  Shortness of breath EXAM: PORTABLE CHEST 1 VIEW COMPARISON:  12/05/2017 FINDINGS: Cardiac shadow remains enlarged. Bilateral jugular catheters are again noted and stable. Bilateral pleural effusions right greater than left are again noted and stable. Patchy infiltrate is noted in the bases bilaterally slightly increased when compared with the prior exam. No pneumothorax is noted. No bony abnormality is seen. IMPRESSION: Stable pleural effusions. Slight increase in bibasilar infiltrates. Electronically Signed   By: Inez Catalina M.D.   On: 12/06/2017 07:06   Dg Chest Port 1 View  Result Date: 12/05/2017 CLINICAL DATA:  Central line placement  EXAM: PORTABLE CHEST 1 VIEW COMPARISON:  12/05/2017, 08/05/2017 FINDINGS: Right-sided central venous catheter tip over the SVC. Left IJ central venous catheter tip over the SVC. No left pneumothorax cardiomegaly with bilateral pleural effusions and bibasilar airspace disease. Vascular congestion with continued pulmonary edema. Aortic atherosclerosis. IMPRESSION: 1. Left IJ central venous catheter tip superimposes the SVC. There is no left pneumothorax 2. Continued cardiomegaly with vascular congestion, mild pulmonary edema and left greater than right pleural effusions. Effusion on the right appears slightly increased. Electronically Signed   By:  Donavan Foil M.D.   On: 12/05/2017 23:47   Dg Chest Portable 1 View  Result Date: 12/05/2017 CLINICAL DATA:  Left leg edema. EXAM: PORTABLE CHEST 1 VIEW COMPARISON:  Radiograph August 05, 2017. FINDINGS: Stable cardiomegaly. Atherosclerosis of thoracic aorta is noted. Right internal jugular dialysis catheter is unchanged in position. No pneumothorax is noted. Mild bibasilar subsegmental atelectasis is noted with bilateral pleural effusions, left greater than right. Bony thorax is unremarkable. IMPRESSION: Mild bibasilar subsegmental atelectasis is noted with bilateral pleural effusions, left greater than right. Aortic Atherosclerosis (ICD10-I70.0). Electronically Signed   By: Marijo Conception, M.D.   On: 12/05/2017 15:30   Ct Renal Stone Study  Result Date: 12/05/2017 CLINICAL DATA:  Abdominal pain nausea vomiting and lethargy EXAM: CT ABDOMEN AND PELVIS WITHOUT CONTRAST TECHNIQUE: Multidetector CT imaging of the abdomen and pelvis was performed following the standard protocol without IV contrast. COMPARISON:  None. FINDINGS: Lower chest: Small left pleural effusion. Small moderate right pleural effusion. Mild consolidations at both bases may reflect atelectasis or pneumonia. Cardiomegaly with trace pericardial effusion. Small gas bubbles in the right atrium.  Extensive coronary vascular calcification. Hepatobiliary: Gallstones. No focal hepatic abnormality or biliary dilatation. Pancreas: Coarse calcifications at the head and uncinate process of the pancreas likely reflecting prior pancreatitis. No acute inflammatory changes. Spleen: Normal in size without focal abnormality. Adrenals/Urinary Tract: Nodularity of the left adrenal gland with 17 mm low-density mass incompletely characterized. Kidneys show no hydronephrosis. 3 mm stone mid left kidney. Intrarenal vascular calcification. Punctate stones in the lower pole of the left kidney. Mildly thick-walled appearance of the bladder. Stomach/Bowel: Stomach nonenlarged. No dilated small bowel. No colon wall thickening. Negative appendix. Vascular/Lymphatic: Extensive aortic atherosclerosis. No aneurysm. No significantly enlarged lymph nodes. Reproductive: Coarse prostate calcifications. Other: No free air or free fluid. Musculoskeletal: Degenerative changes. No acute or suspicious abnormality. IMPRESSION: 1. Bilateral pleural effusions, small on the left and small moderate on the right. Partial consolidations in the lower lobes may reflect atelectasis or pneumonia. 2. Cardiomegaly. Small foci of air in the right atrium suspected to be secondary to peripheral line placement 3. Gallstones 4. Negative for hydronephrosis or ureteral stone. Nonobstructing stones in the left kidney. 5. Thick-walled appearance of the urinary bladder, possible cystitis 6. 17 mm low-density left adrenal mass incompletely characterized without contrast. Could perform nonemergent adrenal CT to better evaluate. 7. Coarse calcifications in the region of the pancreatic head and uncinate process likely reflecting chronic pancreatitis. Electronically Signed   By: Donavan Foil M.D.   On: 12/05/2017 17:55     EGD (11/13) Impression: - Normal larynx. - Tiny hiatal hernia. - Normal  stomach. - One non-bleeding duodenal ulcer with pigmented  material. - Normal duodenal bulb, second portion of the  duodenum, major papilla, third portion of the  duodenum and fourth portion of the duodenum. - Normal examined jejunum. - The examination was otherwise normal. - No specimens collected. Recommendation: - Patient has a contact number available for  emergencies. The signs and symptoms of potential  delayed complications were discussed with the  patient. Return to normal activities tomorrow.  Written discharge instructions were provided to the  patient. - Soft diet today. - Continue present medications. He may be on  aspirin and Plavix at home and will reevaluate his  aspirin use and I think when he goes to the [place] or  [facility] hospital they may restart his medicines and  I do not believe he was on pump inhibitors at home  which is probably why his ulcer has not healed - Return to GI clinic PRN. Continue once a day pump  inhibitor at home   Transthoracic Echocardiogram (11/14) Study Conclusions  - Left ventricle: The cavity size was normal. Wall thickness was increased in a pattern of mild LVH. Systolic function was severely reduced. The estimated ejection fraction was in the range of 25% to 30%. Akinesis of the basal-midinferior myocardium. - Aortic valve: There was mild stenosis. Valve area  (Vmax): 3.07 cm^2. - Left atrium: The atrium was mildly dilated. - Right ventricle: The cavity size was moderately dilated. - Right atrium: The atrium was moderately dilated. - Pulmonary arteries: Systolic pressure was moderately increased. PA peak pressure: 44 mm Hg (S).   Subjective: No concerns  Discharge Exam: Vitals:   12/13/17 1030 12/13/17 1108  BP: 126/71 123/77  Pulse: 100 100  Resp:  18  Temp:  98 F (36.7 C)  SpO2: 100% 100%   Vitals:   12/13/17 0930 12/13/17 1000 12/13/17 1030 12/13/17 1108  BP: (!) 154/96 118/66 126/71 123/77  Pulse: 80 94 100 100  Resp:    18  Temp:    98 F (36.7 C)  TempSrc:      SpO2: 100% 100% 100% 100%  Weight:      Height:        General: Pt is alert, awake, not in acute distress Cardiovascular: RRR, S1/S2 +, no rubs, no gallops Respiratory: CTA bilaterally, no wheezing, no rhonchi Abdominal: Soft, NT, ND, bowel sounds + Extremities: no edema, no cyanosis, right AKA    The results of significant diagnostics from this hospitalization (including imaging, microbiology, ancillary and laboratory) are listed below for reference.     Microbiology: Recent Results (from the past 240 hour(s))  Blood culture (routine x 2)     Status: None   Collection Time: 12/05/17  3:56 PM  Result Value Ref Range Status   Specimen Description BLOOD LEFT ANTECUBITAL  Final   Special Requests   Final    BOTTLES DRAWN AEROBIC AND ANAEROBIC Blood Culture adequate volume   Culture   Final    NO GROWTH 5 DAYS Performed at [facility], [address], [place], [place] [address]    Report Status 12/10/2017 FINAL  Final  Blood culture (routine x 2)     Status: None   Collection Time: 12/05/17  4:10 PM  Result Value Ref Range Status   Specimen Description BLOOD LEFT FOREARM  Final   Special Requests   Final    BOTTLES DRAWN AEROBIC ONLY Blood Culture results may not be optimal due to an inadequate volume of blood received in culture bottles    Culture   Final    NO GROWTH 5 DAYS Performed at [facility], [place] [address], [place], [place] [address]    Report Status 12/10/2017 FINAL  Final  MRSA PCR Screening     Status: None   Collection Time: 12/05/17  9:14 PM  Result Value Ref Range Status   MRSA by PCR NEGATIVE NEGATIVE Final    Comment:        The GeneXpert MRSA Assay (FDA approved for NASAL specimens only), is one component of a comprehensive MRSA colonization surveillance program. It is not intended to diagnose MRSA infection nor to guide or monitor treatment for MRSA infections. Performed at [facility], [place] [address] [place], [place] [address]      Labs: BNP (last 3 results) Recent Labs  05/17/17 1550  BNP 256.3*   Basic Metabolic Panel: Recent Labs  Lab 12/07/17 0346 12/08/17 0449 12/08/17 0836 12/10/17 0800 12/12/17 0544 12/13/17 0719  NA 133*  134* 134* 135 135 135 134*  K 4.5  4.3 4.8 4.8 3.2* 4.2 4.6  CL 100  103 101 103 101 101 98  CO2 23  23 21* 23 28 26 25   GLUCOSE 188*  184* 151* 123* 120* 140* 147*  BUN 56*  53* 64* 64* 20 32* 43*  CREATININE 3.66*  3.44* 4.33* 4.24* 2.29* 4.03* 5.14*  CALCIUM 8.4*  7.8* 8.2* 8.2* 7.9* 8.2* 8.4*  PHOS 3.8  --  6.4* 2.1* 6.4* 7.4*   Liver Function Tests: Recent Labs  Lab 12/07/17 0346 12/08/17 0449 12/08/17 0836 12/10/17 0800 12/12/17 0544 12/13/17 0719  AST 22 19  --   --   --   --   ALT 19 20  --   --   --   --   ALKPHOS 60 65  --   --   --   --   BILITOT 0.4 0.6  --   --   --   --   PROT 5.2* 5.4*  --   --   --   --   ALBUMIN 2.6*  2.5* 2.6* 2.6* 2.6* 2.5* 2.5*   No results for input(s): LIPASE, AMYLASE in the last 168 hours. No results for input(s): AMMONIA in the last 168 hours. CBC: Recent Labs  Lab 12/09/17 0522 12/10/17 0538 12/10/17 0800 12/12/17 0544 12/13/17 0719  WBC 9.8 11.2* 10.2 13.0* 14.9*  HGB 7.4* 8.1* 7.9* 8.2* 7.9*  HCT 25.6* 27.0* 26.7* 27.7* 27.6*  MCV 104.1* 101.9* 101.9* 101.1*  103.0*  PLT 203 260 223 227 289   Cardiac Enzymes: Recent Labs  Lab 12/06/17 1432 12/08/17 1601 12/11/17 0746  TROPONINI 1.25* 0.57* 0.38*   BNP: Invalid input(s): POCBNP CBG: Recent Labs  Lab 12/12/17 2059 12/13/17 0017 12/13/17 0322 12/13/17 0801 12/13/17 1131  GLUCAP 130* 134* 128* 121* 128*   D-Dimer No results for input(s): DDIMER in the last 72 hours. Hgb A1c No results for input(s): HGBA1C in the last 72 hours. Lipid Profile No results for input(s): CHOL, HDL, LDLCALC, TRIG, CHOLHDL, LDLDIRECT in the last 72 hours. Thyroid function studies No results for input(s): TSH, T4TOTAL, T3FREE, THYROIDAB in the last 72 hours.  Invalid input(s): FREET3 Anemia work up No results for input(s): VITAMINB12, FOLATE, FERRITIN, TIBC, IRON, RETICCTPCT in the last 72 hours. Urinalysis    Component Value Date/Time   COLORURINE YELLOW 07/26/2017 0846   APPEARANCEUR CLOUDY (A) 07/26/2017 0846   LABSPEC 1.017 07/26/2017 0846   PHURINE 5.0 07/26/2017 0846   GLUCOSEU NEGATIVE 07/26/2017 0846   HGBUR SMALL (A) 07/26/2017 0846   BILIRUBINUR NEGATIVE 07/26/2017 0846   KETONESUR NEGATIVE 07/26/2017 0846   PROTEINUR >=300 (A) 07/26/2017 0846   UROBILINOGEN 1.0 01/25/2013 0208   NITRITE NEGATIVE 07/26/2017 0846   LEUKOCYTESUR LARGE (A) 07/26/2017 0846   Sepsis Labs Invalid input(s): PROCALCITONIN,  WBC,  LACTICIDVEN Microbiology Recent Results (from the past 240 hour(s))  Blood culture (routine x 2)     Status: None   Collection Time: 12/05/17  3:56 PM  Result Value Ref Range Status   Specimen Description BLOOD LEFT ANTECUBITAL  Final   Special Requests   Final    BOTTLES DRAWN AEROBIC AND ANAEROBIC Blood Culture adequate volume   Culture   Final    NO GROWTH 5 DAYS Performed at [facility]  [facility], [place] [address] [place], [place] [address]    Report Status 12/10/2017 FINAL  Final  Blood culture (routine x 2)     Status: None   Collection Time: 12/05/17  4:10 PM  Result  Value Ref Range Status   Specimen Description BLOOD LEFT FOREARM  Final   Special Requests   Final    BOTTLES DRAWN AEROBIC ONLY Blood Culture results may not be optimal due to an inadequate volume of blood received in culture bottles   Culture   Final    NO GROWTH 5 DAYS Performed at [facility], [place] [address], [place], [place] [address]    Report Status 12/10/2017 FINAL  Final  MRSA PCR Screening     Status: None   Collection Time: 12/05/17  9:14 PM  Result Value Ref Range Status   MRSA by PCR NEGATIVE NEGATIVE Final    Comment:        The GeneXpert MRSA Assay (FDA approved for NASAL specimens only), is one component of a comprehensive MRSA colonization surveillance program. It is not intended to diagnose MRSA infection nor to guide or monitor treatment for MRSA infections. Performed at [facility], [place] [address], [place], [place] [address]      SIGNED:   Cordelia Poche, MD [facility] 12/13/2017, 2:09 PM

## 2017-12-13 NOTE — Procedures (Signed)
Patient seen on Hemodialysis. QB 150 (TDC previously with TPA but flow no better), UF goal 3.4L Will request IR to exchange RIJ TDC. He will likely need central venogram by OP vascular surgery to eval etiology of right arm swelling.   Elmarie Shiley MD [facility]. Office # [phone] Pager # [phone] 8:54 AM

## 2017-12-13 NOTE — Progress Notes (Signed)
   Patient Status: [facility] IP  Assessment and Plan: Patient in need of venous access.   Tunneled Rt IJ tunneled HD catheter exchange  ______________________________________________________________________   History of Present Illness: Mitchell Garcia is a 72 y.o. male   Rt IJ tunneled catheter placed in IR 04/2017 Has worked well Still in use Slow flow per Dialysis and Dr Posey Pronto Despite tpa  Allergies and medications reviewed.   Review of Systems: A 12 point ROS discussed and pertinent positives are indicated in the HPI above.  All other systems are negative.   Vital Signs: BP 126/71   Pulse 100   Temp 98.5 F (36.9 C) (Oral)   Resp 19   Ht 5\' 10"  (1.778 m)   Wt 110 lb 3.7 oz (50 kg)   SpO2 98%   BMI 15.82 kg/m   Physical Exam  Constitutional: He is oriented to person, place, and time.  Neurological: He is alert and oriented to person, place, and time.  Skin: Skin is warm and dry.  Rt IJ dialysis catheter in use Site is clean and dry NT No sign of infection  Psychiatric: He has a normal mood and affect. His behavior is normal.  Vitals reviewed.    Imaging reviewed.   Labs:  COAGS: Recent Labs    05/19/17 0230 07/08/17 0201 12/05/17 2338  INR 1.20 1.05 1.30  APTT  --  34  --     BMP: Recent Labs    12/08/17 0836 12/10/17 0800 12/12/17 0544 12/13/17 0719  NA 135 135 135 134*  K 4.8 3.2* 4.2 4.6  CL 103 101 101 98  CO2 23 28 26 25   GLUCOSE 123* 120* 140* 147*  BUN 64* 20 32* 43*  CALCIUM 8.2* 7.9* 8.2* 8.4*  CREATININE 4.24* 2.29* 4.03* 5.14*  GFRNONAA 13* 26* 13* 10*  GFRAA 15* 31* 15* 12*    Tunneled Rt IJ Tunneled HD catheter exchange Pt is aware of procedure benefits and risks including but limited to infection; bleeding; vessel damage Agreeable to proceed Consent signed andin chart   Electronically Signed: Brentley Landfair A, PA-C 12/13/2017, 10:39 AM   I spent a total of 15 minutes in face to face in clinical consultation,  greater than 50% of which was counseling/coordinating care for venous access.[name], male   DOB: 1945/06/16, 72 y.o.   MRN: [id number]

## 2017-12-14 ENCOUNTER — Encounter (HOSPITAL_COMMUNITY): Payer: Self-pay | Admitting: Diagnostic Radiology

## 2017-12-14 ENCOUNTER — Inpatient Hospital Stay (HOSPITAL_COMMUNITY): Payer: Medicare Other

## 2017-12-14 DIAGNOSIS — D62 Acute posthemorrhagic anemia: Secondary | ICD-10-CM

## 2017-12-14 HISTORY — PX: IR FLUORO GUIDE CV LINE RIGHT: IMG2283

## 2017-12-14 LAB — GLUCOSE, CAPILLARY
GLUCOSE-CAPILLARY: 106 mg/dL — AB (ref 70–99)
GLUCOSE-CAPILLARY: 141 mg/dL — AB (ref 70–99)
Glucose-Capillary: 110 mg/dL — ABNORMAL HIGH (ref 70–99)
Glucose-Capillary: 245 mg/dL — ABNORMAL HIGH (ref 70–99)

## 2017-12-14 MED ORDER — LIDOCAINE HCL 1 % IJ SOLN
INTRAMUSCULAR | Status: DC | PRN
  Administered 2017-12-14: 15 mL

## 2017-12-14 MED ORDER — CHLORHEXIDINE GLUCONATE 4 % EX LIQD
CUTANEOUS | Status: AC
  Filled 2017-12-14: qty 15

## 2017-12-14 MED ORDER — CEFAZOLIN SODIUM-DEXTROSE 2-4 GM/100ML-% IV SOLN
2.0000 g | INTRAVENOUS | Status: AC
  Administered 2017-12-14: 2 g via INTRAVENOUS
  Filled 2017-12-14: qty 100

## 2017-12-14 MED ORDER — LIDOCAINE HCL 1 % IJ SOLN
INTRAMUSCULAR | Status: AC
  Filled 2017-12-14: qty 20

## 2017-12-14 MED ORDER — HEPARIN SODIUM (PORCINE) 1000 UNIT/ML IJ SOLN
INTRAMUSCULAR | Status: AC
  Administered 2017-12-14: 3.2 mL
  Filled 2017-12-14: qty 1

## 2017-12-14 MED ORDER — CEFAZOLIN SODIUM-DEXTROSE 2-4 GM/100ML-% IV SOLN
INTRAVENOUS | Status: AC
  Filled 2017-12-14: qty 100

## 2017-12-14 NOTE — Procedures (Signed)
  Pre-operative Diagnosis: ESRD with HD catheter and poor flows      Post-operative Diagnosis: ESRD with HD catheter and poor flows   Indications: Poor flows  Procedure: Tunneled cathter exchange  Findings: New 19 cm tip to cuff Palindrome placed. Tip at SVC/RA junction  Complications: None     EBL: Minimal  Plan: Will give IV antibiotics thru HD catheter and then catheter is ready to use.

## 2017-12-14 NOTE — Clinical Social Work Note (Signed)
Patient medically stable for discharge and will return to [facility], where he is LTC resident. Facility admissions director Whitney informed and discharge clinicals transmitted via the [facility]. Patient advised at the bedside and wife contacted while in room with patient regarding discharge. Nonemergency transport arranged. CSW signing off as no other SW intervention services needed.  Anthone Prieur Givens, MSW, LCSW Licensed Clinical Social Worker [place] [phone]

## 2017-12-14 NOTE — Discharge Summary (Signed)
Physician Discharge Summary   [name] MRN: [id number] DOB/AGE: Jun 25, 1945 72 y.o.  Admit date: 12/05/2017 Discharge date: 12/14/2017  Primary Care Physician:  Mitchell Darling, MD   Recommendations for Outpatient Follow-up:  1. Follow up with PCP in 1-2 weeks 2. Please obtain BMP/CBC in one week  3. Follow-up with vascular surgery and GI outpatient  Home Health: Patient discharging to skilled nursing facility Equipment/Devices: Skilled nursing facility  Discharge Condition: stable  CODE STATUS: FULL  Diet recommendation: Soft diet   Discharge Diagnoses:    . Acute GI bleed Symptomatic acute blood loss anemia . Pleural effusion, bilateral . Left adrenal mass ([place]) . Hypotension . Anemia of chronic disease . Chest pain with a history of CAD . HCAP Chronic systolic CHF ESRD on HD, TTS Chronic left calf wound Right AKA   Consults:  Nephrology IR Gastroenterology Cardiology   Allergies:   Allergies  Allergen Reactions  . Oxycodone Other (See Comments)    DIFFICULT TO WAKE UP  . Tramadol Nausea And Vomiting  . Lisinopril Other (See Comments)    Hyperkalemia   . Losartan Other (See Comments)    Hyperkalemia      DISCHARGE MEDICATIONS: Allergies as of 12/14/2017      Reactions   Oxycodone Other (See Comments)   DIFFICULT TO WAKE UP   Tramadol Nausea And Vomiting   Lisinopril Other (See Comments)   Hyperkalemia   Losartan Other (See Comments)   Hyperkalemia      Medication List    STOP taking these medications   ciprofloxacin 250 MG tablet Commonly known as:  CIPRO   metolazone 2.5 MG tablet Commonly known as:  ZAROXOLYN   oxyCODONE 5 MG immediate release tablet Commonly known as:  Oxy IR/ROXICODONE   traMADol 50 MG tablet Commonly known as:  ULTRAM     TAKE these medications   acetaminophen 325 MG tablet Commonly known as:  TYLENOL Take 2 tablets (650 mg total) by mouth every 4 (four) hours as needed for mild  pain, fever or headache.   ACETYL L-CARNITINE PO Take 2 capsules by mouth daily.   ALPRAZolam 0.5 MG tablet Commonly known as:  XANAX Take 1 tablet (0.5 mg total) by mouth at bedtime as needed for anxiety. What changed:    when to take this  reasons to take this  additional instructions   amoxicillin-clavulanate 500-125 MG tablet Commonly known as:  AUGMENTIN Take 1 tablet (500 mg total) by mouth daily for 1 day.   aspirin EC 81 MG tablet Take 81 mg by mouth daily.   atorvastatin 20 MG tablet Commonly known as:  LIPITOR Take 1 tablet (20 mg total) by mouth daily at 6 PM. What changed:    medication strength  how much to take  when to take this   busPIRone 5 MG tablet Commonly known as:  BUSPAR Take 2.5 mg by mouth 2 (two) times daily.   Ca Carbonate-Mag Hydroxide 550-110 MG Chew Chew 1-2 tablets by mouth every 4 (four) hours as needed.   carvedilol 3.125 MG tablet Commonly known as:  COREG Take 3.125 mg by mouth 2 (two) times daily with a meal.   cinacalcet 30 MG tablet Commonly known as:  SENSIPAR Take 30 mg by mouth daily.   clopidogrel 75 MG tablet Commonly known as:  PLAVIX Take 75 mg by mouth daily.   Darbepoetin Alfa 60 MCG/0.3ML Sosy injection Commonly known as:  ARANESP Inject 60 mcg into the skin once a week.  DSS 100 MG Caps Take 100 mg by mouth 2 (two) times daily.   fluoruracil 0.5 % cream Commonly known as:  CARAC Apply 1 application topically 2 (two) times daily as needed.   gabapentin 100 MG capsule Commonly known as:  NEURONTIN Take 1 capsule (100 mg total) by mouth at bedtime. What changed:    medication strength  how much to take   Insulin Detemir 100 UNIT/ML Pen Commonly known as:  LEVEMIR Inject 5 Units into the skin at bedtime.   isosorbide-hydrALAZINE 20-37.5 MG tablet Commonly known as:  BIDIL Take 1 tablet by mouth 2 (two) times daily.   ketoconazole 2 % cream Commonly known as:  NIZORAL Apply 1 application  topically 2 (two) times daily as needed.   ketorolac 0.5 % ophthalmic solution Commonly known as:  ACULAR Place 1 drop into the left eye 4 (four) times daily as needed.   lactobacillus acidophilus Tabs tablet Take 1 tablet by mouth 2 (two) times daily. 250 mg   Melatonin 3 MG Caps Take 3 mg by mouth at bedtime as needed.   midodrine 5 MG tablet Commonly known as:  PROAMATINE Take 5 mg by mouth 2 (two) times daily as needed (unknown).   multivitamin Tabs tablet Take 1 tablet by mouth at bedtime.   nitroGLYCERIN 0.4 MG SL tablet Commonly known as:  NITROSTAT Place 1 tablet (0.4 mg total) under the tongue every 5 (five) minutes x 3 doses as needed for chest pain.   ondansetron 4 MG tablet Commonly known as:  ZOFRAN Take 4 mg by mouth every 8 (eight) hours as needed for nausea or vomiting (x72 hours. If not effective after 2 doses, increase to 8 mg).   OXYGEN Inhale 2 L into the lungs as needed (shortness of breath).   pantoprazole 40 MG tablet Commonly known as:  PROTONIX Take 1 tablet (40 mg total) by mouth daily.   primidone 50 MG tablet Commonly known as:  MYSOLINE Take 100 mg by mouth daily.   sevelamer carbonate 800 MG tablet Commonly known as:  RENVELA Take 2 tablets (1,600 mg total) by mouth 3 (three) times daily with meals.   triamcinolone cream 0.1 % Commonly known as:  KENALOG Apply topically 2 (two) times daily.   vitamin B-12 500 MCG tablet Commonly known as:  CYANOCOBALAMIN Take 1,000 mcg by mouth daily.   Vitamin D3 50 MCG (2000 UT) Tabs Take 2,000 Units by mouth daily.        Brief H and P: For complete details please refer to admission H and P, but in brief [name] a72 y.o.Caucasian male nursing home resident ([facility]) who presents to [facility] emergency department with abdominal pain, nausea and weakness.Symptoms apparently worsenedearlier this morning; however, abd pain had been going on for 3 weeks.  In ED,  he had borderline hypotension. He was evaluated by GI.CT scanwas recommended along with treatment with proton pump inhibitor or H2 blocker. While getting CT,SBP dropped to 60s. He was treated with IVF boluses and 40 mcg of Neosynephrine. Hgb was found to be5.1,for which 2 units PRBC were ordered. He denies any recent hematemesis, hematochezia, melena. He denies any recent ASA or NSAID use. He does take 81 mg ASA daily as well as 75 mg Plavix.  Due to hypotension and significant anemia, PCCMwasasked to evaluate for admission. GI was consulted and recommended CT scan and PPI or H2 blockers. Nephrology also consulted given ESRD on HD TTS.  He was seen on a  previous admission by GI and had EGD / colonoscopy on 08/08/17. These showed small hiatal hernia, few non bleeding superficial duodenal ulcers.  Hospital Course:  Acute GI bleed with acute symptomatic blood loss anemia -Definitive source but likely secondary to the duodenal ulcer seen on EGD versus AVM -Patient received 3 units packed RBCs -GI recommended continue aspirin and Plavix and start PPI daily, outpatient follow-up recommended   Acute metabolic encephalopathy/excessive somnolence Possibly due to narcotics, oxycodone listed as allergy for difficulty to wake up, resolved after narcotics were discontinued.  Oxycodone discontinued on discharge  Atypical chest pain Possibly epigastric, history of PCI with stent placement, continue aspirin and Plavix, BiDil EKG showed no evidence of ACS  HCAP Patient was treated empirically with vancomycin and Zosyn, switched to Augmentin, continue for 1 day  Demand ischemia, chronic systolic CHF Patient was valuated by cardiology, 2D echo showed EF of  25 to 30%, continue HD for volume control Continue BiDil, Coreg held due to hypotension  Chronic hypotension Continue midodrine  ESRD on hemodialysis TTS Tunneled dialysis catheter exchanged on 11/20 Follow-up outpatient with  vascular surgery  Diabetes mellitus type 2 -Follow hemoglobin A1c, continue Lantus 5 units at bedtime, sliding scale insulin  Chronic left calf wound, right AKA Continue wound care   Day of Discharge S: No complaints  BP 132/63 (BP Location: Left Arm)   Pulse (!) 52   Temp (!) 97.5 F (36.4 C) (Oral)   Resp 18   Ht 5\' 10"  (1.778 m)   Wt 47 kg   SpO2 94%   BMI 14.87 kg/m   Physical Exam: General: Alert and awake oriented x3 not in any acute distress. HEENT: anicteric sclera, pupils reactive to light and accommodation CVS: S1-S2 clear no murmur rubs or gallops Chest: clear to auscultation bilaterally, no wheezing rales or rhonchi Abdomen: soft nontender, nondistended, normal bowel sounds Extremities: Right AKA, Neuro: no new deficit   The results of significant diagnostics from this hospitalization (including imaging, microbiology, ancillary and laboratory) are listed below for reference.      Procedures/Studies:  Dg Chest Port 1 View  Result Date: 12/11/2017 CLINICAL DATA:  Decreased bowel sounds. EXAM: PORTABLE CHEST 1 VIEW COMPARISON:  12/06/2017. FINDINGS: Stable enlarged cardiac silhouette. Stable right jugular catheter. No significant change in bilateral pleural effusions and prominence of the pulmonary vasculature and interstitial markings. Decreased right basilar airspace opacity and stable left basilar airspace opacity. Thoracic spine degenerative changes. IMPRESSION: 1. Stable changes of congestive heart failure. 2. Decreased right basilar atelectasis or alveolar edema and stable left basilar atelectasis or pneumonia. Electronically Signed   By: Claudie Revering M.D.   On: 12/11/2017 12:58   Dg Chest Port 1 View  Result Date: 12/06/2017 CLINICAL DATA:  Shortness of breath EXAM: PORTABLE CHEST 1 VIEW COMPARISON:  12/05/2017 FINDINGS: Cardiac shadow remains enlarged. Bilateral jugular catheters are again noted and stable. Bilateral pleural effusions right greater  than left are again noted and stable. Patchy infiltrate is noted in the bases bilaterally slightly increased when compared with the prior exam. No pneumothorax is noted. No bony abnormality is seen. IMPRESSION: Stable pleural effusions. Slight increase in bibasilar infiltrates. Electronically Signed   By: Inez Catalina M.D.   On: 12/06/2017 07:06   Dg Chest Port 1 View  Result Date: 12/05/2017 CLINICAL DATA:  Central line placement EXAM: PORTABLE CHEST 1 VIEW COMPARISON:  12/05/2017, 08/05/2017 FINDINGS: Right-sided central venous catheter tip over the SVC. Left IJ central venous catheter tip over the SVC. No  left pneumothorax cardiomegaly with bilateral pleural effusions and bibasilar airspace disease. Vascular congestion with continued pulmonary edema. Aortic atherosclerosis. IMPRESSION: 1. Left IJ central venous catheter tip superimposes the SVC. There is no left pneumothorax 2. Continued cardiomegaly with vascular congestion, mild pulmonary edema and left greater than right pleural effusions. Effusion on the right appears slightly increased. Electronically Signed   By: Donavan Foil M.D.   On: 12/05/2017 23:47   Dg Chest Portable 1 View  Result Date: 12/05/2017 CLINICAL DATA:  Left leg edema. EXAM: PORTABLE CHEST 1 VIEW COMPARISON:  Radiograph August 05, 2017. FINDINGS: Stable cardiomegaly. Atherosclerosis of thoracic aorta is noted. Right internal jugular dialysis catheter is unchanged in position. No pneumothorax is noted. Mild bibasilar subsegmental atelectasis is noted with bilateral pleural effusions, left greater than right. Bony thorax is unremarkable. IMPRESSION: Mild bibasilar subsegmental atelectasis is noted with bilateral pleural effusions, left greater than right. Aortic Atherosclerosis (ICD10-I70.0). Electronically Signed   By: Marijo Conception, M.D.   On: 12/05/2017 15:30   Ct Renal Stone Study  Result Date: 12/05/2017 CLINICAL DATA:  Abdominal pain nausea vomiting and lethargy EXAM: CT  ABDOMEN AND PELVIS WITHOUT CONTRAST TECHNIQUE: Multidetector CT imaging of the abdomen and pelvis was performed following the standard protocol without IV contrast. COMPARISON:  None. FINDINGS: Lower chest: Small left pleural effusion. Small moderate right pleural effusion. Mild consolidations at both bases may reflect atelectasis or pneumonia. Cardiomegaly with trace pericardial effusion. Small gas bubbles in the right atrium. Extensive coronary vascular calcification. Hepatobiliary: Gallstones. No focal hepatic abnormality or biliary dilatation. Pancreas: Coarse calcifications at the head and uncinate process of the pancreas likely reflecting prior pancreatitis. No acute inflammatory changes. Spleen: Normal in size without focal abnormality. Adrenals/Urinary Tract: Nodularity of the left adrenal gland with 17 mm low-density mass incompletely characterized. Kidneys show no hydronephrosis. 3 mm stone mid left kidney. Intrarenal vascular calcification. Punctate stones in the lower pole of the left kidney. Mildly thick-walled appearance of the bladder. Stomach/Bowel: Stomach nonenlarged. No dilated small bowel. No colon wall thickening. Negative appendix. Vascular/Lymphatic: Extensive aortic atherosclerosis. No aneurysm. No significantly enlarged lymph nodes. Reproductive: Coarse prostate calcifications. Other: No free air or free fluid. Musculoskeletal: Degenerative changes. No acute or suspicious abnormality. IMPRESSION: 1. Bilateral pleural effusions, small on the left and small moderate on the right. Partial consolidations in the lower lobes may reflect atelectasis or pneumonia. 2. Cardiomegaly. Small foci of air in the right atrium suspected to be secondary to peripheral line placement 3. Gallstones 4. Negative for hydronephrosis or ureteral stone. Nonobstructing stones in the left kidney. 5. Thick-walled appearance of the urinary bladder, possible cystitis 6. 17 mm low-density left adrenal mass incompletely  characterized without contrast. Could perform nonemergent adrenal CT to better evaluate. 7. Coarse calcifications in the region of the pancreatic head and uncinate process likely reflecting chronic pancreatitis. Electronically Signed   By: Donavan Foil M.D.   On: 12/05/2017 17:55     EGD (11/13) Impression: - Normal larynx. - Tiny hiatal hernia. - Normal stomach. - One non-bleeding duodenal ulcer with pigmented  material. - Normal duodenal bulb, second portion of the  duodenum, major papilla, third portion of the  duodenum and fourth portion of the duodenum. - Normal examined jejunum. - The examination was otherwise normal. - No specimens collected. Recommendation: - Patient has a contact number available for  emergencies. The signs and symptoms of potential  delayed complications were discussed with the  patient. Return to normal activities tomorrow.  Written discharge instructions  were provided to the  patient. - Soft diet today. - Continue present medications. He may be on  aspirin and Plavix at home and will reevaluate his  aspirin use and I think when he goes to the [place] or  [facility] hospital they may restart his medicines and  I do not believe he was on pump inhibitors at home  which is probably why his ulcer has not healed - Return to GI clinic PRN. Continue once a day pump   inhibitor at home   Transthoracic Echocardiogram (11/14) Study Conclusions  - Left ventricle: The cavity size was normal. Wall thickness was increased in a pattern of mild LVH. Systolic function was severely reduced. The estimated ejection fraction was in the range of 25% to 30%. Akinesis of the basal-midinferior myocardium. - Aortic valve: There was mild stenosis. Valve area (Vmax): 3.07 cm^2. - Left atrium: The atrium was mildly dilated. - Right ventricle: The cavity size was moderately dilated. - Right atrium: The atrium was moderately dilated. - Pulmonary arteries: Systolic pressure was moderately increased. PA peak pressure: 44 mm Hg (S).   LAB RESULTS: Basic Metabolic Panel: Recent Labs  Lab 12/12/17 0544 12/13/17 0719  NA 135 134*  K 4.2 4.6  CL 101 98  CO2 26 25  GLUCOSE 140* 147*  BUN 32* 43*  CREATININE 4.03* 5.14*  CALCIUM 8.2* 8.4*  PHOS 6.4* 7.4*   Liver Function Tests: Recent Labs  Lab 12/08/17 0449  12/12/17 0544 12/13/17 0719  AST 19  --   --   --   ALT 20  --   --   --   ALKPHOS 65  --   --   --   BILITOT 0.6  --   --   --   PROT 5.4*  --   --   --   ALBUMIN 2.6*   < > 2.5* 2.5*   < > = values in this interval not displayed.   No results for input(s): LIPASE, AMYLASE in the last 168 hours. No results for input(s): AMMONIA in the last 168 hours. CBC: Recent Labs  Lab 12/12/17 0544 12/13/17 0719  WBC 13.0* 14.9*  HGB 8.2* 7.9*  HCT 27.7* 27.6*  MCV 101.1* 103.0*  PLT 227 289   Cardiac Enzymes: Recent Labs  Lab 12/08/17 1601 12/11/17 0746  TROPONINI 0.57* 0.38*   BNP: Invalid input(s): POCBNP CBG: Recent Labs  Lab 12/14/17 0733 12/14/17 1057  GLUCAP 106* 245*      Disposition and Follow-up:    DISPOSITION:SNF   DISCHARGE FOLLOW-UP Follow-up Information    Clarene Essex, MD. Schedule an appointment as soon as possible for a visit in 2 week(s).   Specialty:   Gastroenterology Contact information: [address] [place] [address] [phone]        Dorette Grate. Schedule an appointment as soon as possible for a visit in 2 week(s).   Specialty:  Surgery Contact information: [address] [place] [address] [phone]        Mitchell Darling, MD. Schedule an appointment as soon as possible for a visit in 2 week(s).   Specialty:  Internal Medicine Contact information: [place] [address] [phone]        Burnell Blanks, MD .   Specialty:  Cardiology Contact information: [place] [place] [address] [phone]            Time coordinating discharge:  25 mins   Signed:   Estill Cotta M.D. [facility] 12/14/2017, 1:37 PM Pager: [phone]

## 2017-12-14 NOTE — Progress Notes (Signed)
Mitchell Garcia to be D/C'd to  Skilled nursing facility per MD order.  Discussed prescriptions and follow up appointments with the patient. Prescriptions given to patient, medication list explained in detail. Pt verbalized understanding.  Allergies as of 12/14/2017      Reactions   Oxycodone Other (See Comments)   DIFFICULT TO WAKE UP   Tramadol Nausea And Vomiting   Lisinopril Other (See Comments)   Hyperkalemia   Losartan Other (See Comments)   Hyperkalemia      Medication List    STOP taking these medications   ciprofloxacin 250 MG tablet Commonly known as:  CIPRO   metolazone 2.5 MG tablet Commonly known as:  ZAROXOLYN   oxyCODONE 5 MG immediate release tablet Commonly known as:  Oxy IR/ROXICODONE   traMADol 50 MG tablet Commonly known as:  ULTRAM     TAKE these medications   acetaminophen 325 MG tablet Commonly known as:  TYLENOL Take 2 tablets (650 mg total) by mouth every 4 (four) hours as needed for mild pain, fever or headache.   ACETYL L-CARNITINE PO Take 2 capsules by mouth daily.   ALPRAZolam 0.5 MG tablet Commonly known as:  XANAX Take 1 tablet (0.5 mg total) by mouth at bedtime as needed for anxiety. What changed:    when to take this  reasons to take this  additional instructions   amoxicillin-clavulanate 500-125 MG tablet Commonly known as:  AUGMENTIN Take 1 tablet (500 mg total) by mouth daily for 1 day.   aspirin EC 81 MG tablet Take 81 mg by mouth daily.   atorvastatin 20 MG tablet Commonly known as:  LIPITOR Take 1 tablet (20 mg total) by mouth daily at 6 PM. What changed:    medication strength  how much to take  when to take this   busPIRone 5 MG tablet Commonly known as:  BUSPAR Take 2.5 mg by mouth 2 (two) times daily.   Ca Carbonate-Mag Hydroxide 550-110 MG Chew Chew 1-2 tablets by mouth every 4 (four) hours as needed.   carvedilol 3.125 MG tablet Commonly known as:  COREG Take 3.125 mg by mouth 2 (two) times daily  with a meal.   cinacalcet 30 MG tablet Commonly known as:  SENSIPAR Take 30 mg by mouth daily.   clopidogrel 75 MG tablet Commonly known as:  PLAVIX Take 75 mg by mouth daily.   Darbepoetin Alfa 60 MCG/0.3ML Sosy injection Commonly known as:  ARANESP Inject 60 mcg into the skin once a week.   DSS 100 MG Caps Take 100 mg by mouth 2 (two) times daily.   fluoruracil 0.5 % cream Commonly known as:  CARAC Apply 1 application topically 2 (two) times daily as needed.   gabapentin 100 MG capsule Commonly known as:  NEURONTIN Take 1 capsule (100 mg total) by mouth at bedtime. What changed:    medication strength  how much to take   Insulin Detemir 100 UNIT/ML Pen Commonly known as:  LEVEMIR Inject 5 Units into the skin at bedtime.   isosorbide-hydrALAZINE 20-37.5 MG tablet Commonly known as:  BIDIL Take 1 tablet by mouth 2 (two) times daily.   ketoconazole 2 % cream Commonly known as:  NIZORAL Apply 1 application topically 2 (two) times daily as needed.   ketorolac 0.5 % ophthalmic solution Commonly known as:  ACULAR Place 1 drop into the left eye 4 (four) times daily as needed.   lactobacillus acidophilus Tabs tablet Take 1 tablet by mouth 2 (two) times daily.  250 mg   Melatonin 3 MG Caps Take 3 mg by mouth at bedtime as needed.   midodrine 5 MG tablet Commonly known as:  PROAMATINE Take 5 mg by mouth 2 (two) times daily as needed (unknown).   multivitamin Tabs tablet Take 1 tablet by mouth at bedtime.   nitroGLYCERIN 0.4 MG SL tablet Commonly known as:  NITROSTAT Place 1 tablet (0.4 mg total) under the tongue every 5 (five) minutes x 3 doses as needed for chest pain.   ondansetron 4 MG tablet Commonly known as:  ZOFRAN Take 4 mg by mouth every 8 (eight) hours as needed for nausea or vomiting (x72 hours. If not effective after 2 doses, increase to 8 mg).   OXYGEN Inhale 2 L into the lungs as needed (shortness of breath).   pantoprazole 40 MG  tablet Commonly known as:  PROTONIX Take 1 tablet (40 mg total) by mouth daily.   primidone 50 MG tablet Commonly known as:  MYSOLINE Take 100 mg by mouth daily.   sevelamer carbonate 800 MG tablet Commonly known as:  RENVELA Take 2 tablets (1,600 mg total) by mouth 3 (three) times daily with meals.   triamcinolone cream 0.1 % Commonly known as:  KENALOG Apply topically 2 (two) times daily.   vitamin B-12 500 MCG tablet Commonly known as:  CYANOCOBALAMIN Take 1,000 mcg by mouth daily.   Vitamin D3 50 MCG (2000 UT) Tabs Take 2,000 Units by mouth daily.       Vitals:   12/14/17 0415 12/14/17 0734  BP: (!) 123/50 132/63  Pulse: (!) 52 (!) 52  Resp: 20 18  Temp: (!) 97.4 F (36.3 C) (!) 97.5 F (36.4 C)  SpO2: 99% 94%    Skin clean, dry and intact without evidence of skin break down, no evidence of skin tears noted. IV catheter discontinued intact. Site without signs and symptoms of complications. Dressing and pressure applied. Pt denies pain at this time. No complaints noted.  An After Visit Summary was printed and given to the patient. Patient waiting for DC to SNF via transport.

## 2017-12-15 DIAGNOSIS — N186 End stage renal disease: Secondary | ICD-10-CM | POA: Diagnosis not present

## 2017-12-15 DIAGNOSIS — K922 Gastrointestinal hemorrhage, unspecified: Secondary | ICD-10-CM | POA: Diagnosis not present

## 2017-12-15 DIAGNOSIS — E089 Diabetes mellitus due to underlying condition without complications: Secondary | ICD-10-CM | POA: Diagnosis not present

## 2017-12-15 DIAGNOSIS — J189 Pneumonia, unspecified organism: Secondary | ICD-10-CM | POA: Diagnosis not present

## 2017-12-26 DIAGNOSIS — R52 Pain, unspecified: Secondary | ICD-10-CM | POA: Diagnosis not present

## 2018-01-04 DIAGNOSIS — R279 Unspecified lack of coordination: Secondary | ICD-10-CM | POA: Diagnosis not present

## 2018-01-04 DIAGNOSIS — R29898 Other symptoms and signs involving the musculoskeletal system: Secondary | ICD-10-CM | POA: Diagnosis not present

## 2018-01-04 DIAGNOSIS — Z743 Need for continuous supervision: Secondary | ICD-10-CM | POA: Diagnosis not present

## 2018-01-04 DIAGNOSIS — I959 Hypotension, unspecified: Secondary | ICD-10-CM | POA: Diagnosis not present

## 2018-01-05 DIAGNOSIS — G546 Phantom limb syndrome with pain: Secondary | ICD-10-CM | POA: Diagnosis not present

## 2018-01-05 DIAGNOSIS — G8918 Other acute postprocedural pain: Secondary | ICD-10-CM | POA: Diagnosis not present

## 2018-01-05 DIAGNOSIS — M79606 Pain in leg, unspecified: Secondary | ICD-10-CM | POA: Diagnosis not present

## 2018-01-09 DIAGNOSIS — D509 Iron deficiency anemia, unspecified: Secondary | ICD-10-CM | POA: Diagnosis not present

## 2018-01-09 DIAGNOSIS — I739 Peripheral vascular disease, unspecified: Secondary | ICD-10-CM | POA: Diagnosis not present

## 2018-01-09 DIAGNOSIS — N186 End stage renal disease: Secondary | ICD-10-CM | POA: Diagnosis not present

## 2018-01-09 DIAGNOSIS — I2510 Atherosclerotic heart disease of native coronary artery without angina pectoris: Secondary | ICD-10-CM | POA: Diagnosis not present

## 2018-01-09 DIAGNOSIS — Z89612 Acquired absence of left leg above knee: Secondary | ICD-10-CM | POA: Diagnosis not present

## 2018-01-12 DIAGNOSIS — R279 Unspecified lack of coordination: Secondary | ICD-10-CM | POA: Diagnosis not present

## 2018-01-12 DIAGNOSIS — Z743 Need for continuous supervision: Secondary | ICD-10-CM | POA: Diagnosis not present

## 2018-01-14 ENCOUNTER — Emergency Department (HOSPITAL_COMMUNITY): Payer: Medicare Other

## 2018-01-14 ENCOUNTER — Observation Stay (HOSPITAL_COMMUNITY)
Admission: EM | Admit: 2018-01-14 | Discharge: 2018-01-17 | Disposition: A | Discharge status: Skilled Nursing Facility | Payer: Medicare Other | Attending: Internal Medicine | Admitting: Internal Medicine

## 2018-01-14 ENCOUNTER — Encounter (HOSPITAL_COMMUNITY): Payer: Self-pay | Admitting: Emergency Medicine

## 2018-01-14 DIAGNOSIS — E1151 Type 2 diabetes mellitus with diabetic peripheral angiopathy without gangrene: Secondary | ICD-10-CM | POA: Insufficient documentation

## 2018-01-14 DIAGNOSIS — L89159 Pressure ulcer of sacral region, unspecified stage: Secondary | ICD-10-CM

## 2018-01-14 DIAGNOSIS — D638 Anemia in other chronic diseases classified elsewhere: Secondary | ICD-10-CM | POA: Diagnosis present

## 2018-01-14 DIAGNOSIS — I700 Atherosclerosis of aorta: Secondary | ICD-10-CM | POA: Insufficient documentation

## 2018-01-14 DIAGNOSIS — E1122 Type 2 diabetes mellitus with diabetic chronic kidney disease: Secondary | ICD-10-CM | POA: Diagnosis not present

## 2018-01-14 DIAGNOSIS — I252 Old myocardial infarction: Secondary | ICD-10-CM | POA: Insufficient documentation

## 2018-01-14 DIAGNOSIS — Z955 Presence of coronary angioplasty implant and graft: Secondary | ICD-10-CM | POA: Insufficient documentation

## 2018-01-14 DIAGNOSIS — Z791 Long term (current) use of non-steroidal anti-inflammatories (NSAID): Secondary | ICD-10-CM | POA: Insufficient documentation

## 2018-01-14 DIAGNOSIS — N186 End stage renal disease: Secondary | ICD-10-CM | POA: Diagnosis not present

## 2018-01-14 DIAGNOSIS — Z7982 Long term (current) use of aspirin: Secondary | ICD-10-CM | POA: Insufficient documentation

## 2018-01-14 DIAGNOSIS — Z8673 Personal history of transient ischemic attack (TIA), and cerebral infarction without residual deficits: Secondary | ICD-10-CM | POA: Diagnosis not present

## 2018-01-14 DIAGNOSIS — Z794 Long term (current) use of insulin: Secondary | ICD-10-CM

## 2018-01-14 DIAGNOSIS — Z888 Allergy status to other drugs, medicaments and biological substances status: Secondary | ICD-10-CM | POA: Insufficient documentation

## 2018-01-14 DIAGNOSIS — I5043 Acute on chronic combined systolic (congestive) and diastolic (congestive) heart failure: Secondary | ICD-10-CM | POA: Diagnosis not present

## 2018-01-14 DIAGNOSIS — E785 Hyperlipidemia, unspecified: Secondary | ICD-10-CM | POA: Insufficient documentation

## 2018-01-14 DIAGNOSIS — E8779 Other fluid overload: Secondary | ICD-10-CM | POA: Diagnosis not present

## 2018-01-14 DIAGNOSIS — Z85828 Personal history of other malignant neoplasm of skin: Secondary | ICD-10-CM | POA: Insufficient documentation

## 2018-01-14 DIAGNOSIS — I9589 Other hypotension: Secondary | ICD-10-CM | POA: Insufficient documentation

## 2018-01-14 DIAGNOSIS — L8990 Pressure ulcer of unspecified site, unspecified stage: Secondary | ICD-10-CM | POA: Diagnosis present

## 2018-01-14 DIAGNOSIS — Z89612 Acquired absence of left leg above knee: Secondary | ICD-10-CM | POA: Insufficient documentation

## 2018-01-14 DIAGNOSIS — M79604 Pain in right leg: Secondary | ICD-10-CM | POA: Diagnosis not present

## 2018-01-14 DIAGNOSIS — J9611 Chronic respiratory failure with hypoxia: Secondary | ICD-10-CM | POA: Insufficient documentation

## 2018-01-14 DIAGNOSIS — J90 Pleural effusion, not elsewhere classified: Principal | ICD-10-CM | POA: Insufficient documentation

## 2018-01-14 DIAGNOSIS — I2510 Atherosclerotic heart disease of native coronary artery without angina pectoris: Secondary | ICD-10-CM | POA: Insufficient documentation

## 2018-01-14 DIAGNOSIS — Z89611 Acquired absence of right leg above knee: Secondary | ICD-10-CM | POA: Diagnosis not present

## 2018-01-14 DIAGNOSIS — Z9861 Coronary angioplasty status: Secondary | ICD-10-CM

## 2018-01-14 DIAGNOSIS — R2689 Other abnormalities of gait and mobility: Secondary | ICD-10-CM | POA: Diagnosis not present

## 2018-01-14 DIAGNOSIS — E8770 Fluid overload, unspecified: Secondary | ICD-10-CM | POA: Diagnosis present

## 2018-01-14 DIAGNOSIS — G8929 Other chronic pain: Secondary | ICD-10-CM | POA: Insufficient documentation

## 2018-01-14 DIAGNOSIS — Z9981 Dependence on supplemental oxygen: Secondary | ICD-10-CM | POA: Insufficient documentation

## 2018-01-14 DIAGNOSIS — Z79899 Other long term (current) drug therapy: Secondary | ICD-10-CM | POA: Insufficient documentation

## 2018-01-14 DIAGNOSIS — Z7902 Long term (current) use of antithrombotics/antiplatelets: Secondary | ICD-10-CM | POA: Diagnosis not present

## 2018-01-14 DIAGNOSIS — I132 Hypertensive heart and chronic kidney disease with heart failure and with stage 5 chronic kidney disease, or end stage renal disease: Secondary | ICD-10-CM | POA: Insufficient documentation

## 2018-01-14 DIAGNOSIS — I4510 Unspecified right bundle-branch block: Secondary | ICD-10-CM | POA: Diagnosis not present

## 2018-01-14 DIAGNOSIS — Z992 Dependence on renal dialysis: Secondary | ICD-10-CM | POA: Insufficient documentation

## 2018-01-14 DIAGNOSIS — I4891 Unspecified atrial fibrillation: Secondary | ICD-10-CM | POA: Diagnosis present

## 2018-01-14 DIAGNOSIS — E1129 Type 2 diabetes mellitus with other diabetic kidney complication: Secondary | ICD-10-CM | POA: Diagnosis present

## 2018-01-14 DIAGNOSIS — R52 Pain, unspecified: Secondary | ICD-10-CM

## 2018-01-14 DIAGNOSIS — R627 Adult failure to thrive: Secondary | ICD-10-CM | POA: Insufficient documentation

## 2018-01-14 DIAGNOSIS — M79605 Pain in left leg: Secondary | ICD-10-CM | POA: Diagnosis not present

## 2018-01-14 DIAGNOSIS — Z885 Allergy status to narcotic agent status: Secondary | ICD-10-CM | POA: Insufficient documentation

## 2018-01-14 DIAGNOSIS — Z9889 Other specified postprocedural states: Secondary | ICD-10-CM

## 2018-01-14 LAB — COMPREHENSIVE METABOLIC PANEL
ALT: 22 U/L (ref 0–44)
AST: 25 U/L (ref 15–41)
Albumin: 2.3 g/dL — ABNORMAL LOW (ref 3.5–5.0)
Alkaline Phosphatase: 94 U/L (ref 38–126)
Anion gap: 15 (ref 5–15)
BUN: 19 mg/dL (ref 8–23)
CO2: 25 mmol/L (ref 22–32)
Calcium: 7.6 mg/dL — ABNORMAL LOW (ref 8.9–10.3)
Chloride: 95 mmol/L — ABNORMAL LOW (ref 98–111)
Creatinine, Ser: 1.91 mg/dL — ABNORMAL HIGH (ref 0.61–1.24)
GFR calc non Af Amer: 34 mL/min — ABNORMAL LOW (ref >60)
GFR, EST AFRICAN AMERICAN: 39 mL/min — AB (ref >60)
Glucose, Bld: 142 mg/dL — ABNORMAL HIGH (ref 70–99)
Potassium: 3.9 mmol/L (ref 3.5–5.1)
SODIUM: 135 mmol/L (ref 135–145)
Total Bilirubin: 0.7 mg/dL (ref 0.3–1.2)
Total Protein: 5.6 g/dL — ABNORMAL LOW (ref 6.5–8.1)

## 2018-01-14 LAB — CBC WITH DIFFERENTIAL/PLATELET
Abs Immature Granulocytes: 0.04 10*3/uL (ref 0.00–0.07)
BASOS PCT: 0 %
Basophils Absolute: 0 10*3/uL (ref 0.0–0.1)
Eosinophils Absolute: 0.2 10*3/uL (ref 0.0–0.5)
Eosinophils Relative: 1 %
HCT: 27 % — ABNORMAL LOW (ref 39.0–52.0)
Hemoglobin: 7.6 g/dL — ABNORMAL LOW (ref 13.0–17.0)
Immature Granulocytes: 0 %
Lymphocytes Relative: 7 %
Lymphs Abs: 1 10*3/uL (ref 0.7–4.0)
MCH: 28 pg (ref 26.0–34.0)
MCHC: 28.1 g/dL — ABNORMAL LOW (ref 30.0–36.0)
MCV: 99.6 fL (ref 80.0–100.0)
Monocytes Absolute: 1 10*3/uL (ref 0.1–1.0)
Monocytes Relative: 7 %
Neutro Abs: 12.5 10*3/uL — ABNORMAL HIGH (ref 1.7–7.7)
Neutrophils Relative %: 85 %
PLATELETS: 389 10*3/uL (ref 150–400)
RBC: 2.71 MIL/uL — ABNORMAL LOW (ref 4.22–5.81)
RDW: 18.7 % — ABNORMAL HIGH (ref 11.5–15.5)
WBC: 14.7 10*3/uL — ABNORMAL HIGH (ref 4.0–10.5)
nRBC: 0 % (ref 0.0–0.2)

## 2018-01-14 LAB — LIPASE, BLOOD: Lipase: 17 U/L (ref 11–51)

## 2018-01-14 LAB — POCT I-STAT TROPONIN I: Troponin i, poc: 0.11 ng/mL (ref 0.00–0.08)

## 2018-01-14 LAB — PROTIME-INR
INR: 1.1
Prothrombin Time: 14.1 seconds (ref 11.4–15.2)

## 2018-01-14 LAB — GLUCOSE, CAPILLARY: Glucose-Capillary: 147 mg/dL — ABNORMAL HIGH (ref 70–99)

## 2018-01-14 LAB — BRAIN NATRIURETIC PEPTIDE: B Natriuretic Peptide: >4500 pg/mL — ABNORMAL HIGH (ref 0.0–100.0)

## 2018-01-14 MED ORDER — CARVEDILOL 3.125 MG PO TABS
3.1250 mg | ORAL_TABLET | Freq: Two times a day (BID) | ORAL | Status: DC
  Administered 2018-01-15 – 2018-01-16 (×3): 3.125 mg via ORAL
  Filled 2018-01-14 (×3): qty 1

## 2018-01-14 MED ORDER — BUSPIRONE HCL 5 MG PO TABS
2.50 | ORAL_TABLET | ORAL | Status: DC
Start: 2018-01-12 — End: 2018-01-14

## 2018-01-14 MED ORDER — ISOSORB DINITRATE-HYDRALAZINE 20-37.5 MG PO TABS
1.0000 | ORAL_TABLET | Freq: Two times a day (BID) | ORAL | Status: DC
  Administered 2018-01-14 – 2018-01-17 (×6): 1 via ORAL
  Filled 2018-01-14 (×6): qty 1

## 2018-01-14 MED ORDER — ISOSORBIDE DINITRATE 20 MG PO TABS
20.00 | ORAL_TABLET | ORAL | Status: DC
Start: 2018-01-12 — End: 2018-01-14

## 2018-01-14 MED ORDER — ATORVASTATIN CALCIUM 10 MG PO TABS
20.0000 mg | ORAL_TABLET | Freq: Every day | ORAL | Status: DC
  Administered 2018-01-15 – 2018-01-16 (×2): 20 mg via ORAL
  Filled 2018-01-14 (×3): qty 2

## 2018-01-14 MED ORDER — ONDANSETRON HCL 4 MG/2ML IJ SOLN: 4.0000 mg | Freq: Four times a day (QID) | INTRAMUSCULAR | Status: DC | PRN

## 2018-01-14 MED ORDER — GLUCAGON HCL RDNA (DIAGNOSTIC) 1 MG IJ SOLR
1.00 | INTRAMUSCULAR | Status: DC
Start: ? — End: 2018-01-14

## 2018-01-14 MED ORDER — ALBUTEROL SULFATE (2.5 MG/3ML) 0.083% IN NEBU
2.50 | INHALATION_SOLUTION | RESPIRATORY_TRACT | Status: DC
Start: ? — End: 2018-01-14

## 2018-01-14 MED ORDER — DEXTROSE 10 % IV SOLN
125.00 | INTRAVENOUS | Status: DC
Start: ? — End: 2018-01-14

## 2018-01-14 MED ORDER — INSULIN ASPART 100 UNIT/ML ~~LOC~~ SOLN
0.0000 [IU] | Freq: Three times a day (TID) | SUBCUTANEOUS | Status: DC
  Administered 2018-01-15: 2 [IU] via SUBCUTANEOUS
  Administered 2018-01-15: 3 [IU] via SUBCUTANEOUS
  Administered 2018-01-15: 1 [IU] via SUBCUTANEOUS
  Administered 2018-01-16: 2 [IU] via SUBCUTANEOUS
  Administered 2018-01-16: 3 [IU] via SUBCUTANEOUS
  Administered 2018-01-17: 2 [IU] via SUBCUTANEOUS

## 2018-01-14 MED ORDER — TRAMADOL HCL 50 MG PO TABS: 50.0000 mg | ORAL_TABLET | Freq: Three times a day (TID) | ORAL | Status: DC | PRN

## 2018-01-14 MED ORDER — PANTOPRAZOLE SODIUM 40 MG PO TBEC
40.0000 mg | DELAYED_RELEASE_TABLET | Freq: Every day | ORAL | Status: DC
  Administered 2018-01-15 – 2018-01-17 (×3): 40 mg via ORAL
  Filled 2018-01-14 (×3): qty 1

## 2018-01-14 MED ORDER — INSULIN LISPRO 100 UNIT/ML ~~LOC~~ SOLN
1.00 | SUBCUTANEOUS | Status: DC
Start: 2018-01-12 — End: 2018-01-14

## 2018-01-14 MED ORDER — HEPARIN SODIUM (PORCINE) 5000 UNIT/ML IJ SOLN
5000.00 | INTRAMUSCULAR | Status: DC
Start: 2018-01-12 — End: 2018-01-14

## 2018-01-14 MED ORDER — HEPARIN SODIUM (PORCINE) 5000 UNIT/ML IJ SOLN
5000.0000 [IU] | Freq: Three times a day (TID) | INTRAMUSCULAR | Status: DC
  Filled 2018-01-14 (×4): qty 1

## 2018-01-14 MED ORDER — ASPIRIN EC 81 MG PO TBEC
81.00 | DELAYED_RELEASE_TABLET | ORAL | Status: DC
Start: 2018-01-13 — End: 2018-01-14

## 2018-01-14 MED ORDER — ASPIRIN EC 81 MG PO TBEC
81.0000 mg | DELAYED_RELEASE_TABLET | Freq: Every day | ORAL | Status: DC
  Administered 2018-01-15 – 2018-01-17 (×3): 81 mg via ORAL
  Filled 2018-01-14 (×3): qty 1

## 2018-01-14 MED ORDER — SODIUM CHLORIDE 0.9 % IV SOLN: 250.0000 mL | INTRAVENOUS | Status: DC | PRN

## 2018-01-14 MED ORDER — ACETAMINOPHEN 325 MG PO TABS: 650.0000 mg | ORAL_TABLET | ORAL | Status: DC | PRN

## 2018-01-14 MED ORDER — INSULIN GLARGINE 100 UNIT/ML ~~LOC~~ SOLN
5.00 | SUBCUTANEOUS | Status: DC
Start: 2018-01-12 — End: 2018-01-14

## 2018-01-14 MED ORDER — PRIMIDONE 50 MG PO TABS
100.0000 mg | ORAL_TABLET | Freq: Every day | ORAL | Status: DC
  Administered 2018-01-15 – 2018-01-17 (×3): 100 mg via ORAL
  Filled 2018-01-14 (×3): qty 2

## 2018-01-14 MED ORDER — GABAPENTIN 100 MG PO CAPS
100.00 | ORAL_CAPSULE | ORAL | Status: DC
Start: 2018-01-12 — End: 2018-01-14

## 2018-01-14 MED ORDER — ALPRAZOLAM 0.5 MG PO TABS
0.5000 mg | ORAL_TABLET | Freq: Every evening | ORAL | Status: DC | PRN
  Administered 2018-01-14 – 2018-01-16 (×2): 0.5 mg via ORAL
  Filled 2018-01-14 (×2): qty 1

## 2018-01-14 MED ORDER — CINACALCET HCL 30 MG PO TABS
30.00 | ORAL_TABLET | ORAL | Status: DC
Start: 2018-01-13 — End: 2018-01-14

## 2018-01-14 MED ORDER — CINACALCET HCL 30 MG PO TABS
30.0000 mg | ORAL_TABLET | Freq: Every day | ORAL | Status: DC
  Administered 2018-01-15 – 2018-01-17 (×3): 30 mg via ORAL
  Filled 2018-01-14 (×3): qty 1

## 2018-01-14 MED ORDER — INSULIN ASPART 100 UNIT/ML ~~LOC~~ SOLN: 0.0000 [IU] | Freq: Every day | SUBCUTANEOUS | Status: DC

## 2018-01-14 MED ORDER — MELATONIN 3 MG PO TABS
3.0000 mg | ORAL_TABLET | Freq: Every evening | ORAL | Status: DC | PRN
  Administered 2018-01-16: 3 mg via ORAL
  Filled 2018-01-14 (×2): qty 1

## 2018-01-14 MED ORDER — CLOPIDOGREL BISULFATE 75 MG PO TABS
75.00 | ORAL_TABLET | ORAL | Status: DC
Start: 2018-01-13 — End: 2018-01-14

## 2018-01-14 MED ORDER — ACETAMINOPHEN 325 MG PO TABS
650.00 | ORAL_TABLET | ORAL | Status: DC
Start: ? — End: 2018-01-14

## 2018-01-14 MED ORDER — HEPARIN SODIUM (PORCINE) 5000 UNIT/ML IJ SOLN: 5000.0000 [IU] | Freq: Three times a day (TID) | INTRAMUSCULAR | Status: DC

## 2018-01-14 MED ORDER — GUAIFENESIN-DM 100-10 MG/5ML PO SYRP
5.00 | ORAL_SOLUTION | ORAL | Status: DC
Start: ? — End: 2018-01-14

## 2018-01-14 MED ORDER — OXYCODONE HCL 5 MG PO TABS
5.0000 mg | ORAL_TABLET | Freq: Once | ORAL | Status: AC
  Administered 2018-01-14: 5 mg via ORAL
  Filled 2018-01-14: qty 1

## 2018-01-14 MED ORDER — ATORVASTATIN CALCIUM 40 MG PO TABS
20.00 | ORAL_TABLET | ORAL | Status: DC
Start: 2018-01-12 — End: 2018-01-14

## 2018-01-14 MED ORDER — OXYCODONE HCL 5 MG PO TABS
5.0000 mg | ORAL_TABLET | Freq: Four times a day (QID) | ORAL | Status: DC | PRN
  Administered 2018-01-15: 5 mg via ORAL
  Filled 2018-01-14: qty 1

## 2018-01-14 MED ORDER — SEVELAMER CARBONATE 800 MG PO TABS
1600.00 | ORAL_TABLET | ORAL | Status: DC
Start: 2018-01-12 — End: 2018-01-14

## 2018-01-14 MED ORDER — PANTOPRAZOLE SODIUM 40 MG PO TBEC
40.00 | DELAYED_RELEASE_TABLET | ORAL | Status: DC
Start: 2018-01-13 — End: 2018-01-14

## 2018-01-14 MED ORDER — GLUCOSE 40 % PO GEL
15.00 | ORAL | Status: DC
Start: ? — End: 2018-01-14

## 2018-01-14 MED ORDER — PRIMIDONE 50 MG PO TABS
100.00 | ORAL_TABLET | ORAL | Status: DC
Start: 2018-01-12 — End: 2018-01-14

## 2018-01-14 MED ORDER — DIAZEPAM 5 MG PO TABS
2.5000 mg | ORAL_TABLET | Freq: Three times a day (TID) | ORAL | Status: DC | PRN
  Administered 2018-01-15 – 2018-01-17 (×2): 2.5 mg via ORAL
  Filled 2018-01-14 (×2): qty 1

## 2018-01-14 MED ORDER — OXYCODONE HCL 5 MG PO TABS
2.50 | ORAL_TABLET | ORAL | Status: DC
Start: ? — End: 2018-01-14

## 2018-01-14 MED ORDER — GABAPENTIN 100 MG PO CAPS
100.0000 mg | ORAL_CAPSULE | Freq: Every day | ORAL | Status: DC
  Administered 2018-01-14: 100 mg via ORAL
  Filled 2018-01-14: qty 1

## 2018-01-14 MED ORDER — SEVELAMER CARBONATE 800 MG PO TABS
1600.0000 mg | ORAL_TABLET | Freq: Three times a day (TID) | ORAL | Status: DC
  Administered 2018-01-17: 1600 mg via ORAL
  Filled 2018-01-14: qty 2

## 2018-01-14 MED ORDER — SODIUM CHLORIDE 0.9% FLUSH: 3.0000 mL | INTRAVENOUS | Status: DC | PRN

## 2018-01-14 MED ORDER — TRAZODONE HCL 50 MG PO TABS
25.00 | ORAL_TABLET | ORAL | Status: DC
Start: ? — End: 2018-01-14

## 2018-01-14 MED ORDER — SODIUM CHLORIDE 0.9% FLUSH
3.0000 mL | Freq: Two times a day (BID) | INTRAVENOUS | Status: DC
  Administered 2018-01-14 – 2018-01-15 (×2): 3 mL via INTRAVENOUS
  Administered 2018-01-16: 10 mL via INTRAVENOUS

## 2018-01-14 MED ORDER — MIDODRINE HCL 5 MG PO TABS
10.00 | ORAL_TABLET | ORAL | Status: DC
Start: 2018-01-14 — End: 2018-01-14

## 2018-01-14 MED ORDER — FUROSEMIDE 10 MG/ML IJ SOLN: 80.0000 mg | Freq: Once | INTRAMUSCULAR | Status: DC

## 2018-01-14 MED ORDER — CLOPIDOGREL BISULFATE 75 MG PO TABS
75.0000 mg | ORAL_TABLET | Freq: Every day | ORAL | Status: DC
  Administered 2018-01-15 – 2018-01-17 (×3): 75 mg via ORAL
  Filled 2018-01-14 (×3): qty 1

## 2018-01-14 MED ORDER — BUSPIRONE HCL 5 MG PO TABS
2.5000 mg | ORAL_TABLET | Freq: Two times a day (BID) | ORAL | Status: DC
  Administered 2018-01-14 – 2018-01-17 (×6): 2.5 mg via ORAL
  Filled 2018-01-14 (×5): qty 1

## 2018-01-14 MED ORDER — ONDANSETRON HCL 4 MG/2ML IJ SOLN
4.00 | INTRAMUSCULAR | Status: DC
Start: ? — End: 2018-01-14

## 2018-01-14 MED ORDER — DOCUSATE SODIUM 100 MG PO CAPS
100.00 | ORAL_CAPSULE | ORAL | Status: DC
Start: 2018-01-12 — End: 2018-01-14

## 2018-01-14 NOTE — ED Triage Notes (Signed)
Pt here from dialysis center with c/o pain all over , pt has a chronic decub on his sacrum , pt received a full dialysis

## 2018-01-14 NOTE — H&P (Signed)
History and Physical    Mitchell Garcia DOB: [birth year] DOA: 01/14/2018  PCP: [name], MD   Patient coming from: ALF    Chief Complaint: Increased SOB, increased knee and low back pain, low abdominal pain   HPI: Mitchell Garcia is a 72 y.o. male with medical history significant for end-stage renal disease on hemodialysis, coronary artery disease with stent, chronic systolic CHF, chronic normocytic anemia, chronic pain, and recent left BKA, now presenting to the emergency department from his dialysis session with complaints of increased shortness of breath and increased pain in multiple locations.  He denies any chest pain, fevers, or chills.  Reports occasional nonproductive cough.  Completed his dialysis session just prior to arrival in the ED.  Was admitted to [facility] from 01/10/2018 until 01/12/2018 with hypervolemia, dialyzed on the 17th and 18th, refused recommended dialysis on the 19th and was discharged.  Reports that he urinates about 1/4 cup every couple days.  States that he has not had any chest pain since his stent more than a year ago.  Was noted to be in atrial fibrillation during his recent hospitalizations, not anticoagulated, presumably secondary to chronic GI blood loss and recent admission with symptomatic anemia.  ED Course: Upon arrival to the ED, patient is found to be afebrile, saturating 100% on his usual 2 L/min of supplemental oxygen, and with vitals otherwise normal.  EKG features atrial fibrillation with PVC, RBBB, and nonspecific ST-T abnormalities that appears similar to prior.  Chest x-ray is notable for enlarged cardiac silhouette with pulmonary vascular congestion, mild pulmonary edema, and moderate to large right pleural effusion that is increased from the prior study.  CT of the abdomen and pelvis is negative for acute findings, notable for large right pleural effusion and smaller left pleural effusion.  Chemistry panel features  and albumin of 2.3 with normal potassium, normal bicarbonate, normal BUN.  CBC features a stable leukocytosis at 14,700 and a stable chronic normocytic anemia with hemoglobin 7.6.  BNP is undetectably high.  Patient was given 5 mg oxycodone in the ED.  Remains hemodynamically stable, is dyspneic but not in any acute distress, and will be observed for ongoing evaluation and management.  Review of Systems:  All other systems reviewed and apart from HPI, are negative.  Past Medical History:  Diagnosis Date  . Acute kidney injury superimposed on chronic kidney disease ([place]) 10/15/2016  . Altered mental state 01/25/2013  . Anemia of chronic disease 01/25/2013  . CAD S/P percutaneous coronary angioplasty    a.not to be a candidate for CABG. On 9/19, went back to cath lab for PCI s/p orbital atherectomy with DES to ostial RCA, and PTCA/DES to distal RCA. Residual 50% prox LAD, 70% D1, CTO mLCx noted by initial diagnostic cath 10/08/16 to be treated medically.  . Cancer ([place])   . Cellulitis 07/2017   right leg  . Cellulitis of right lower extremity 07/25/2017  . Chronic combined systolic and diastolic CHF (congestive heart failure) ([place])   . CKD (chronic kidney disease), stage IV ([place])   . Diabetes mellitus with renal complications (H[facility])    uncontrolled  . Essential hypertension 10/15/2016  . Humerus head fracture 01/27/2013  . Hyperlipidemia   . Hyponatremia 01/25/2013  . Insulin dependent diabetes mellitus ([place])   . Ischemic cardiomyopathy 10/15/2016  . Left adrenal mass ([place]) 12/05/2017  . MI (myocardial infarction) ([place])    years ago  . Stroke ([place])   . Transaminitis 01/25/2013  .  Uncontrolled diabetes mellitus ([place]) 10/15/2016    Past Surgical History:  Procedure Laterality Date  . COLONOSCOPY WITH PROPOFOL N/A 08/09/2017   Procedure: COLONOSCOPY WITH PROPOFOL Hemostatic Clips;  Surgeon: Clarene Essex, MD;  Location: [place];  Service: Endoscopy;  Laterality: N/A;  . CORONARY STENT INTERVENTION  N/A 10/13/2016   Procedure: CORONARY STENT INTERVENTION;  Surgeon: Burnell Blanks, MD;  Location: [place] [facility];  Service: Cardiovascular;  Laterality: N/A;  . ESOPHAGOGASTRODUODENOSCOPY (EGD) WITH PROPOFOL N/A 08/08/2017   Procedure: ESOPHAGOGASTRODUODENOSCOPY (EGD) WITH PROPOFOL;  Surgeon: Clarene Essex, MD;  Location: [place];  Service: Endoscopy;  Laterality: N/A;  . ESOPHAGOGASTRODUODENOSCOPY (EGD) WITH PROPOFOL N/A 12/07/2017   Procedure: ESOPHAGOGASTRODUODENOSCOPY (EGD) WITH PROPOFOL;  Surgeon: Clarene Essex, MD;  Location: [place];  Service: Endoscopy;  Laterality: N/A;  . IR FLUORO GUIDE CV LINE RIGHT  05/19/2017  . IR FLUORO GUIDE CV LINE RIGHT  12/14/2017  . IR US GUIDE VASC ACCESS RIGHT  05/19/2017  . LEFT HEART CATH AND CORONARY ANGIOGRAPHY N/A 10/08/2016   Procedure: LEFT HEART CATH AND CORONARY ANGIOGRAPHY;  Surgeon: Nelva Bush, MD;  Location: [place] [facility];  Service: Cardiovascular;  Laterality: N/A;  . SCHLEROTHERAPY  08/09/2017   Procedure: Woodward Ku OF AVM;  Surgeon: Clarene Essex, MD;  Location: [place];  Service: Endoscopy;;  . SKIN CANCER EXCISION    . TEMPORARY PACEMAKER N/A 10/13/2016   Procedure: Temporary Pacemaker;  Surgeon: Burnell Blanks, MD;  Location: [place] [facility];  Service: Cardiovascular;  Laterality: N/A;     reports that he has never smoked. He has never used smokeless tobacco. He reports that he does not drink alcohol or use drugs.  Allergies  Allergen Reactions  . Oxycodone Other (See Comments)    Renders the patient DIFFICULT TO WAKE UP  . Tramadol Nausea And Vomiting  . Lisinopril Other (See Comments)    Hyperkalemia   . Losartan Other (See Comments)    Hyperkalemia     Family History  Problem Relation Age of Onset  . Hyperlipidemia Father      Prior to Admission medications   Medication Sig Start Date End Date Taking? Authorizing Provider  acetaminophen (TYLENOL) 325 MG tablet Take 2  tablets (650 mg total) by mouth every 4 (four) hours as needed for mild pain, fever or headache. 05/24/17  Yes Emokpae, Courage, MD  Acetylcarnitine HCl (ACETYL L-CARNITINE PO) Take 2 capsules by mouth daily.   Yes [provider]  aspirin EC 81 MG tablet Take 81 mg by mouth daily.   Yes [provider]  atorvastatin (LIPITOR) 20 MG tablet Take 1 tablet (20 mg total) by mouth daily at 6 PM. 12/13/17  Yes Mariel Aloe, MD  bumetanide (BUMEX) 2 MG tablet Take 4 mg by mouth 2 (two) times daily.   Yes [provider]  busPIRone (BUSPAR) 5 MG tablet Take 2.5 mg by mouth 2 (two) times daily.   Yes [provider]  Ca Carbonate-Mag Hydroxide 550-110 MG CHEW Chew 1-2 tablets by mouth every 4 (four) hours as needed (for indigestion).    Yes [provider]  carvedilol (COREG) 3.125 MG tablet Take 3.125 mg by mouth See admin instructions. Take 3.125 mg by mouth two times day and hold for a heart rate <09 or Systolic b/p reading <323   Yes [provider]  Cholecalciferol (VITAMIN D3) 50 MCG (2000 UT) TABS Take 2,000 Units by mouth daily.   Yes [provider]  cinacalcet (SENSIPAR)  30 MG tablet Take 30 mg by mouth daily.   Yes [provider]  clopidogrel (PLAVIX) 75 MG tablet Take 75 mg by mouth daily.   Yes [provider]  diazepam (VALIUM) 5 MG tablet Take 2.5 mg by mouth every 8 (eight) hours as needed for muscle spasms.   Yes [provider]  docusate sodium 100 MG CAPS Take 100 mg by mouth 2 (two) times daily. 02/01/13  Yes Theodis Blaze, MD  Insulin Detemir (LEVEMIR) 100 UNIT/ML Pen Inject 5 Units into the skin at bedtime.   Yes [provider]  isosorbide-hydrALAZINE (BIDIL) 20-37.5 MG tablet Take 1 tablet by mouth 2 (two) times daily. 05/24/17  Yes Emokpae, Courage, MD  ketoconazole (NIZORAL) 2 % cream Apply 1 application topically 2 (two) times daily as needed (to dry areas of skin).    Yes [provider]  ketorolac (ACULAR) 0.5 % ophthalmic solution Place 1 drop into the left eye 4 (four) times daily as needed (AS DIRECTED).    Yes [provider]  lactobacillus acidophilus (BACID) TABS tablet Take 2 tablets by mouth daily.    Yes [provider]  magic mouthwash SOLN 5 mLs 4 (four) times daily as needed for mouth pain (for 7 days, as directed).   Yes [provider]  Melatonin 3 MG CAPS Take 3 mg by mouth at bedtime as needed (for insomnia).    Yes [provider]  Nutritional Supplements (NEPRO) LIQD Take 237 mLs by mouth 3 (three) times daily with meals.   Yes [provider]  oxyCODONE (OXY IR/ROXICODONE) 5 MG immediate release tablet Take 5 mg by mouth See admin instructions. Take 5 mg by mouth once a day and an additional 5 mg every six hours as needed for pain   Yes [provider]  pantoprazole (PROTONIX) 40 MG tablet Take 1 tablet (40 mg total) by mouth daily. 12/13/17  Yes Mariel Aloe, MD  primidone (MYSOLINE) 50 MG tablet Take 100 mg by mouth daily.   Yes [provider]  traMADol (ULTRAM) 50 MG tablet Take 50 mg by mouth 3 (three) times daily as needed (for pain).   Yes [provider]  ALPRAZolam Duanne Moron) 0.5 MG tablet Take 1 tablet (0.5 mg total) by mouth at bedtime as needed for anxiety. 12/13/17   Mariel Aloe, MD  Darbepoetin Alfa (ARANESP) 60 MCG/0.3ML SOSY injection Inject 60 mcg into the skin once a week.    [provider]  fluoruracil (CARAC) 0.5 % cream Apply 1 application topically 2 (two) times daily as needed.     [provider]  gabapentin (NEURONTIN) 100 MG capsule Take 1 capsule (100 mg total) by mouth at bedtime. 12/13/17   Mariel Aloe, MD  midodrine (PROAMATINE) 5 MG tablet Take 5 mg by mouth 2 (two) times daily as needed (unknown).    [provider]  multivitamin (RENA-VIT) TABS tablet Take 1 tablet by mouth at bedtime. 05/24/17   Roxan Hockey, MD  nitroGLYCERIN (NITROSTAT) 0.4 MG SL tablet Place 1 tablet (0.4 mg total) under the tongue every 5 (five) minutes x 3 doses as needed for chest pain. 10/18/16   Cheryln Manly, NP  ondansetron (ZOFRAN) 4 MG tablet Take 4 mg by mouth every 8 (eight) hours as needed for nausea or vomiting (x72 hours. If not effective after 2 doses, increase to 8 mg).    [provider]  OXYGEN Inhale 2 L into the lungs  as needed (shortness of breath).    [provider]  sevelamer carbonate (RENVELA) 800 MG tablet Take 2 tablets (1,600 mg total) by mouth 3 (three) times daily with meals. 07/10/17   Diallo, Earna Coder, MD  triamcinolone cream (KENALOG) 0.1 % Apply topically 2 (two) times daily. 08/10/17   Matilde Haymaker, MD  vitamin B-12 (CYANOCOBALAMIN) 500 MCG tablet Take 1,000 mcg by mouth daily.    [provider]    Physical Exam: Vitals:   01/14/18 1900 01/14/18 1945 01/14/18 2045 01/14/18 2130  BP: 125/70 120/86 (!) 147/61 121/77  Pulse: (!) 48 72 (!) 52 (!) 44  Resp: (!) 23 20 (!) 27 (!) 35  Temp:      TempSrc:      SpO2: 99% 100% 99% 99%    Constitutional: NAD, appears uncomfortable  Eyes: PERTLA, lids and conjunctivae normal ENMT: Mucous membranes are moist. Posterior pharynx clear of any exudate or lesions.   Neck: normal, supple, no masses, no thyromegaly Respiratory: Breath sounds diminished bilaterally. Speaking full sentences. No accessory muscle use.  Cardiovascular: Rate ~60 and irregular. Neck vein distension. Abdomen: No distension, soft, generalized tenderness without rebound pain or guarding. Bowel sounds active.  Musculoskeletal: no clubbing / cyanosis. Status-post bilateral LE amputations.  Skin: Sacral ulcer, scant bleeding from LLE amputation site. Warm, dry, well-perfused. Neurologic: No facial asymmetry. Sensation intact. ll 4 limbs. Moving all extremities.   Psychiatric:  Alert and oriented to person, place, and situation. Cooperative.     Labs on Admission: I have personally reviewed following labs and imaging studies  CBC: Recent Labs  Lab 01/14/18 1928  WBC 14.7*  NEUTROABS 12.5*  HGB 7.6*  HCT 27.0*  MCV 99.6  PLT 161   Basic Metabolic Panel: Recent Labs  Lab 01/14/18 1928  NA 135  K 3.9  CL 95*  CO2 25  GLUCOSE 142*  BUN 19  CREATININE 1.91*  CALCIUM 7.6*   GFR: CrCl cannot be calculated (Unknown ideal weight.). Liver Function Tests: Recent Labs  Lab 01/14/18 1928  AST 25  ALT 22  ALKPHOS 94  BILITOT 0.7  PROT 5.6*  ALBUMIN 2.3*   Recent Labs  Lab 01/14/18 1928  LIPASE 17   No results for input(s): AMMONIA in the last 168 hours. Coagulation Profile: Recent Labs  Lab 01/14/18 1928  INR 1.10   Cardiac Enzymes: No results for input(s): CKTOTAL, CKMB, CKMBINDEX, TROPONINI in the last 168 hours. BNP (last 3 results) No results for input(s): PROBNP in the last 8760 hours. HbA1C: No results for input(s): HGBA1C in the last 72 hours. CBG: No results for input(s): GLUCAP in the last 168 hours. Lipid Profile: No results for input(s): CHOL, HDL, LDLCALC, TRIG, CHOLHDL, LDLDIRECT in the last 72 hours. Thyroid Function Tests: No results for input(s): TSH, T4TOTAL, FREET4, T3FREE, THYROIDAB in the last 72 hours. Anemia Panel: No results for input(s): VITAMINB12, FOLATE, FERRITIN, TIBC, IRON, RETICCTPCT in the last 72 hours. Urine analysis:    Component Value Date/Time   COLORURINE YELLOW 07/26/2017 0846   APPEARANCEUR CLOUDY (A) 07/26/2017 0846   LABSPEC 1.017 07/26/2017 0846   PHURINE 5.0 07/26/2017 0846   GLUCOSEU NEGATIVE 07/26/2017 0846   HGBUR SMALL (A) 07/26/2017 0846   BILIRUBINUR NEGATIVE 07/26/2017 0846   KETONESUR NEGATIVE 07/26/2017 0846   PROTEINUR >=300 (A) 07/26/2017 0846   UROBILINOGEN 1.0 01/25/2013 0208   NITRITE NEGATIVE 07/26/2017 0846   LEUKOCYTESUR LARGE (A) 07/26/2017 0846   Sepsis Labs: @LABRCNTIP (procalcitonin:4,lacticidven:4) )No results found for  this  or any previous visit (from the past 240 hour(s)).   Radiological Exams on Admission: Ct Abdomen Pelvis Wo Contrast  Result Date: 01/14/2018 CLINICAL DATA:  Pain all over. EXAM: CT ABDOMEN AND PELVIS WITHOUT CONTRAST TECHNIQUE: Multidetector CT imaging of the abdomen and pelvis was performed following the standard protocol without IV contrast. COMPARISON:  12/05/2017 FINDINGS: Lower chest: Heart is enlarged. Coronary artery calcification is evident. Central line tip identified distal SVC. Large right pleural effusion is associated with right lower lobe collapse/consolidation. There is a small left pleural effusion. Hepatobiliary: No focal abnormality in the liver on this study without intravenous contrast. Layering tiny gallstones evident. No intrahepatic or extrahepatic biliary dilation. Pancreas: Pancreas is diffusely atrophic with parenchymal calcification in the region the pancreatic head. Imaging features are stable and likely reflect chronic pancreatitis. Spleen: No focal mass lesion. No dilatation of the main duct. No intraparenchymal cyst. No peripancreatic edema. Adrenals/Urinary Tract: Right adrenal gland unremarkable. Stable 13 mm left adrenal nodule. Low attenuation of this nodule suggests adrenal adenoma. Tiny stones noted in the left kidney. Right kidney unremarkable. No evidence for hydroureter. The urinary bladder appears normal for the degree of distention. Stomach/Bowel: Stomach is nondistended. No gastric wall thickening. No evidence of outlet obstruction. Duodenum is normally positioned as is the ligament of Treitz. No small bowel wall thickening. No small bowel dilatation. The terminal ileum is normal. The appendix is normal. No gross colonic mass. No colonic wall thickening. Vascular/Lymphatic: There is abdominal aortic atherosclerosis without aneurysm. There is no gastrohepatic or hepatoduodenal ligament lymphadenopathy. No intraperitoneal or retroperitoneal lymphadenopathy. No  pelvic sidewall lymphadenopathy. Reproductive: The prostate gland and seminal vesicles have normal imaging features. Other: Small volume intraperitoneal free fluid evident. There is diffuse body wall edema associated with mesenteric and presacral edema. Musculoskeletal: Small left groin hernia contains only fat. No worrisome lytic or sclerotic osseous abnormality. IMPRESSION: 1. No acute findings in the abdomen or pelvis. 2. Large right pleural effusion with right lower lobe collapse/consolidation. 3. Small left pleural effusion. 4. Small volume intraperitoneal free fluid with diffuse body wall edema. 5. Cholelithiasis. 6. Stable left adrenal adenoma. Electronically Signed   By: Misty Stanley M.D.   On: 01/14/2018 20:12   Dg Chest 1 View  Result Date: 01/14/2018 CLINICAL DATA:  Shortness of breath, abdominal pain EXAM: CHEST  1 VIEW COMPARISON:  Portable exam 1856 hours compared to 12/11/2017 FINDINGS: RIGHT jugular line with tip projecting over SVC near cavoatrial junction. Enlargement of cardiac silhouette with pulmonary vascular congestion. Moderate to large RIGHT pleural effusion with basilar atelectasis. Underlying pulmonary edema. Atherosclerotic calcification aorta. No pneumothorax. Bones demineralized. IMPRESSION: Enlargement of cardiac silhouette with pulmonary vascular congestion and mild pulmonary edema. Moderate to large RIGHT pleural effusion and basilar atelectasis increased since prior study. Electronically Signed   By: Lavonia Dana M.D.   On: 01/14/2018 19:11    EKG: Independently reviewed. Atrial fibrillation, PVC, RBBB, ST-T abnormalities, similar to prior 12/11/17.   Assessment/Plan   1. Volume overload  - Presents with increased SOB and increase in chronic knee and and back pain  - Found to be hypervolemic, likely multifactorial  - Plan as below    2. Acute on chronic combined systolic and diastolic CHF  - EF was 29-52% last month and he is hypervolemic on presentation  - Only  makes a few mL of urine every couple days so diuretics unlikely to help much  - SLIV, fluid-restrict diet, may benefit from inpatient HD tomorrow but no emergent indications  -  Continue Coreg and Bidil as tolerated    3. ESRD  - Completed HD just prior to arrival in ED  - Remains hypervolemic  - Dialyzed 12/17 and 12/18 at [facility], was advised to undergo HD again on 12/19 d/t ongoing hypervolemia but refused  - Only makes a few mL of urine every couple days so diuretics unlikely to help much  - SLIV, fluid-restrict diet, may benefit from inpatient HD tomorrow but no emergent indications   4. Pleural effusions  - Treated for HCAP last month, no infectious sxs currently, likely secondary to vol o/l from #'s 2 & 3 - Large effusion on left with increased SOB, will consult with IR for thoracentesis with labs    5. Atrial fibrillation  - In rate-controlled a fib on admission, noted during admission here last month and during admission earlier this week at [place] at least 4 (age, CHF, CAD, DM)  - Not likely to be candidate for anticoagulation given chronic GI blood-loss and admission last month with symptomatic anemia  - Continue DAPT and beta-blocker for now  6. Inuslin-dependent DM  - A1c was 7.3% in July  - Managed oupt with Levemir 5 units qHS  - Check CBG's and use a low-intensity SSI only for now    7. CAD - Denies any anginal sxs since PCI in September 2018  - ST-T abnormalities on EKG appear similar to prior  - Continue medical tx with DAPT, statin, nitrates, beta-blocker    8. Chronic pain  - Patient reports increased in chronic back and knee pain, also reporting lower abdominal pain with benign exam and CT abd/pelvis with no acute findings  - Plan to continue his outpatient regimen with gabapentin, tramadol, oxycodone   9. Pressure wound  - Wound care   10. Anemia  - Hgb appears stable at 7.6  - Scant blood on left BKA dressing, staples intact with no apparent  dehiscence or infection  - Likely secondary to CKD and chronic GI blood-loss, managed with PPI, Aranesp and nutritional supplements    DVT prophylaxis: sq heparin Code Status: Full; patient wants trial of full code, but does not want to remain on life support if not improving quickly  Family Communication: Discussed with patient  Consults called: None Admission status: Observation     Vianne Bulls, MD [facility] Pager [phone]  If 7PM-7AM, please contact night-coverage [email or web address] Password [facility]  01/14/2018, 10:01 PM

## 2018-01-14 NOTE — ED Provider Notes (Signed)
[facility] EMERGENCY DEPARTMENT Provider Note   CSN: [id number] Arrival date & time: 01/14/18  1823     History   Chief Complaint No chief complaint on file.   HPI Mitchell Garcia is a 72 y.o. male.  The history is provided by the patient and medical records. No language interpreter was used.     Patient is a 72 year old male with a past medical history of ESRD currently on HD TThS (no recently missed sessions per patient), CAD status post CABG currently on Plavix and ASA, DM, HTN, HDL, extremity neuropathy status post bilateral AKA, CVA, CHF, and chronic anemia who presents for evaluation after his dialysis session today for "everything hurts".  It is difficult to obtain a linear history from the patient as he states he has had chronic pain in his back, right arm, and bilateral knees.  He states he developed some abdominal pain while on the way back to his assisted living facility that does not feel similar to prior stone episodes.  Patient is unable to call any of his past medical history or daily medications which are obtained from the medical records.  Patient does not recall any details from his recent prior hospitalizations.  Patient denies any recent traumatic injuries or falls.  He states he is currently on oxygen at his assisted living facility but does not know how much.  Patient is unable to further characterize his abdominal pain.  He denies alleviating or aggravating factors.  Past Medical History:  Diagnosis Date  . Acute kidney injury superimposed on chronic kidney disease ([place]) 10/15/2016  . Altered mental state 01/25/2013  . Anemia of chronic disease 01/25/2013  . CAD S/P percutaneous coronary angioplasty    a.not to be a candidate for CABG. On 9/19, went back to cath lab for PCI s/p orbital atherectomy with DES to ostial RCA, and PTCA/DES to distal RCA. Residual 50% prox LAD, 70% D1, CTO mLCx noted by initial diagnostic cath 10/08/16 to be treated  medically.  . Cancer ([place])   . Cellulitis 07/2017   right leg  . Cellulitis of right lower extremity 07/25/2017  . Chronic combined systolic and diastolic CHF (congestive heart failure) ([place])   . CKD (chronic kidney disease), stage IV ([place])   . Diabetes mellitus with renal complications (H[facility])    uncontrolled  . Essential hypertension 10/15/2016  . Humerus head fracture 01/27/2013  . Hyperlipidemia   . Hyponatremia 01/25/2013  . Insulin dependent diabetes mellitus ([place])   . Ischemic cardiomyopathy 10/15/2016  . Left adrenal mass ([place]) 12/05/2017  . MI (myocardial infarction) ([place])    years ago  . Stroke ([place])   . Transaminitis 01/25/2013  . Uncontrolled diabetes mellitus ([place]) 10/15/2016    Patient Active Problem List   Diagnosis Date Noted  . Volume overload 01/14/2018  . Chronic pain 01/14/2018  . Chronic respiratory failure with hypoxia ([place]) 01/14/2018  . Unspecified atrial fibrillation ([place]) 01/14/2018  . Chronic systolic CHF (congestive heart failure) ([place]) 12/10/2017  . Poor venous access   . GI bleed 12/05/2017  . Large pleural effusion 12/05/2017  . Chronic pancreatitis ([place]) 12/05/2017  . Left adrenal mass ([place]) 12/05/2017  . Dermatitis   . Skin lesions   . Pressure injury of skin 07/07/2017  . ESRD on dialysis ([place]) 05/24/2017  . Acute on chronic combined systolic and diastolic CHF (congestive heart failure) ([place]) 05/17/2017  . Ischemic cardiomyopathy 10/15/2016  . CAD S/P percutaneous coronary angioplasty 10/15/2016  . DM (diabetes  mellitus), type 2 with renal complications ([place]) 86/57/8469  . Essential hypertension 10/15/2016  . Acute combined systolic (congestive) and diastolic (congestive) heart failure ([place])   . Humerus head fracture 01/27/2013  . Anemia of chronic disease 01/25/2013    Past Surgical History:  Procedure Laterality Date  . COLONOSCOPY WITH PROPOFOL N/A 08/09/2017   Procedure: COLONOSCOPY WITH PROPOFOL Hemostatic Clips;  Surgeon: Clarene Essex,  MD;  Location: [facility];  Service: Endoscopy;  Laterality: N/A;  . CORONARY STENT INTERVENTION N/A 10/13/2016   Procedure: CORONARY STENT INTERVENTION;  Surgeon: Burnell Blanks, MD;  Location: [facility];  Service: Cardiovascular;  Laterality: N/A;  . ESOPHAGOGASTRODUODENOSCOPY (EGD) WITH PROPOFOL N/A 08/08/2017   Procedure: ESOPHAGOGASTRODUODENOSCOPY (EGD) WITH PROPOFOL;  Surgeon: Clarene Essex, MD;  Location: [facility];  Service: Endoscopy;  Laterality: N/A;  . ESOPHAGOGASTRODUODENOSCOPY (EGD) WITH PROPOFOL N/A 12/07/2017   Procedure: ESOPHAGOGASTRODUODENOSCOPY (EGD) WITH PROPOFOL;  Surgeon: Clarene Essex, MD;  Location: [facility];  Service: Endoscopy;  Laterality: N/A;  . IR FLUORO GUIDE CV LINE RIGHT  05/19/2017  . IR FLUORO GUIDE CV LINE RIGHT  12/14/2017  . IR US GUIDE VASC ACCESS RIGHT  05/19/2017  . LEFT HEART CATH AND CORONARY ANGIOGRAPHY N/A 10/08/2016   Procedure: LEFT HEART CATH AND CORONARY ANGIOGRAPHY;  Surgeon: Nelva Bush, MD;  Location: [facility];  Service: Cardiovascular;  Laterality: N/A;  . SCHLEROTHERAPY  08/09/2017   Procedure: [place]ward Ku OF AVM;  Surgeon: Clarene Essex, MD;  Location: [facility];  Service: Endoscopy;;  . SKIN CANCER EXCISION    . TEMPORARY PACEMAKER N/A 10/13/2016   Procedure: Temporary Pacemaker;  Surgeon: Burnell Blanks, MD;  Location: [facility];  Service: Cardiovascular;  Laterality: N/A;        Home Medications    Prior to Admission medications   Medication Sig Start Date End Date Taking? Authorizing Provider  acetaminophen (TYLENOL) 325 MG tablet Take 2 tablets (650 mg total) by mouth every 4 (four) hours as needed for mild pain, fever or headache. 05/24/17  Yes Emokpae, Courage, MD  Acetylcarnitine HCl (ACETYL L-CARNITINE PO) Take 2 capsules by mouth daily.   Yes [provider]  aspirin EC 81 MG tablet Take 81 mg by mouth daily.   Yes [provider]  atorvastatin (LIPITOR)  20 MG tablet Take 1 tablet (20 mg total) by mouth daily at 6 PM. 12/13/17  Yes Mariel Aloe, MD  bumetanide (BUMEX) 2 MG tablet Take 4 mg by mouth 2 (two) times daily.   Yes [provider]  busPIRone (BUSPAR) 5 MG tablet Take 2.5 mg by mouth 2 (two) times daily.   Yes [provider]  Ca Carbonate-Mag Hydroxide 550-110 MG CHEW Chew 1-2 tablets by mouth every 4 (four) hours as needed (for indigestion).    Yes [provider]  carvedilol (COREG) 3.125 MG tablet Take 3.125 mg by mouth See admin instructions. Take 3.125 mg by mouth two times day and hold for a heart rate <62 or Systolic b/p reading <952   Yes [provider]  Cholecalciferol (VITAMIN D3) 50 MCG (2000 UT) TABS Take 2,000 Units by mouth daily.   Yes [provider]  cinacalcet (SENSIPAR) 30 MG tablet Take 30 mg by mouth daily.   Yes [provider]  clopidogrel (PLAVIX) 75 MG tablet Take 75 mg by mouth daily.   Yes [provider]  diazepam (VALIUM) 5 MG tablet Take 2.5 mg by mouth every 8 (eight) hours as needed  for muscle spasms.   Yes [provider]  docusate sodium 100 MG CAPS Take 100 mg by mouth 2 (two) times daily. 02/01/13  Yes Theodis Blaze, MD  Insulin Detemir (LEVEMIR) 100 UNIT/ML Pen Inject 5 Units into the skin at bedtime.   Yes [provider]  isosorbide-hydrALAZINE (BIDIL) 20-37.5 MG tablet Take 1 tablet by mouth 2 (two) times daily. 05/24/17  Yes Emokpae, Courage, MD  ketoconazole (NIZORAL) 2 % cream Apply 1 application topically 2 (two) times daily as needed (to dry areas of skin).    Yes [provider]  ketorolac (ACULAR) 0.5 % ophthalmic solution Place 1 drop into the left eye 4 (four) times daily as needed (AS DIRECTED).    Yes [provider]  lactobacillus acidophilus (BACID) TABS tablet Take 2 tablets by mouth daily.    Yes [provider]  magic mouthwash SOLN 5 mLs 4 (four) times daily as needed for mouth  pain (for 7 days, as directed).   Yes [provider]  Melatonin 3 MG CAPS Take 3 mg by mouth at bedtime as needed (for insomnia).    Yes [provider]  Nutritional Supplements (NEPRO) LIQD Take 237 mLs by mouth 3 (three) times daily with meals.   Yes [provider]  oxyCODONE (OXY IR/ROXICODONE) 5 MG immediate release tablet Take 5 mg by mouth See admin instructions. Take 5 mg by mouth once a day and an additional 5 mg every six hours as needed for pain   Yes [provider]  pantoprazole (PROTONIX) 40 MG tablet Take 1 tablet (40 mg total) by mouth daily. 12/13/17  Yes Mariel Aloe, MD  primidone (MYSOLINE) 50 MG tablet Take 100 mg by mouth daily.   Yes [provider]  traMADol (ULTRAM) 50 MG tablet Take 50 mg by mouth 3 (three) times daily as needed (for pain).   Yes [provider]  ALPRAZolam Duanne Moron) 0.5 MG tablet Take 1 tablet (0.5 mg total) by mouth at bedtime as needed for anxiety. 12/13/17   Mariel Aloe, MD  Darbepoetin Alfa (ARANESP) 60 MCG/0.3ML SOSY injection Inject 60 mcg into the skin once a week.    [provider]  fluoruracil (CARAC) 0.5 % cream Apply 1 application topically 2 (two) times daily as needed.     [provider]  gabapentin (NEURONTIN) 100 MG capsule Take 1 capsule (100 mg total) by mouth at bedtime. 12/13/17   Mariel Aloe, MD  midodrine (PROAMATINE) 5 MG tablet Take 5 mg by mouth 2 (two) times daily as needed (unknown).    [provider]  multivitamin (RENA-VIT) TABS tablet Take 1 tablet by mouth at bedtime. 05/24/17   Roxan Hockey, MD  nitroGLYCERIN (NITROSTAT) 0.4 MG SL tablet Place 1 tablet (0.4 mg total) under the tongue every 5 (five) minutes x 3 doses as needed for chest pain. 10/18/16   Cheryln Manly, NP  ondansetron (ZOFRAN) 4 MG tablet Take 4 mg by mouth every 8 (eight) hours as needed for nausea or vomiting (x72 hours. If not effective after 2 doses, increase  to 8 mg).    [provider]  OXYGEN Inhale 2 L into the lungs as needed (shortness of breath).    [provider]  sevelamer carbonate (RENVELA) 800 MG tablet Take 2 tablets (1,600 mg total) by mouth 3 (three) times daily with meals. 07/10/17   Diallo, Earna Coder, MD  triamcinolone cream (KENALOG) 0.1 % Apply topically 2 (two) times daily.  08/10/17   Matilde Haymaker, MD  vitamin B-12 (CYANOCOBALAMIN) 500 MCG tablet Take 1,000 mcg by mouth daily.    [provider]    Family History Family History  Problem Relation Age of Onset  . Hyperlipidemia Father     Social History Social History   Tobacco Use  . Smoking status: Never Smoker  . Smokeless tobacco: Never Used  Substance Use Topics  . Alcohol use: No  . Drug use: No     Allergies   Oxycodone; Tramadol; Lisinopril; and Losartan   Review of Systems Review of Systems  Constitutional: Positive for fatigue. Negative for chills and fever.  HENT: Negative for ear pain and sore throat.   Eyes: Negative for pain and visual disturbance.  Respiratory: Negative for cough and shortness of breath.   Cardiovascular: Negative for chest pain and palpitations.  Gastrointestinal: Positive for abdominal pain. Negative for vomiting.  Genitourinary: Negative for dysuria and hematuria.  Musculoskeletal: Positive for arthralgias ( chronic b/l leg pain and r shoulder pain) and myalgias ( everywhere). Negative for back pain.  Skin: Positive for wound ( chronic sacral wound). Negative for color change and rash.  Neurological: Negative for seizures and syncope.  All other systems reviewed and are negative.    Physical Exam Updated Vital Signs BP (!) 151/80 (BP Location: Left Arm)   Pulse (!) 58   Temp 98.4 F (36.9 C)   Resp 20   SpO2 100%   Physical Exam Vitals signs and nursing note reviewed.  Constitutional:      Appearance: Normal appearance. He is well-developed and normal weight.  HENT:     Head:  Normocephalic and atraumatic.     Right Ear: External ear normal.     Left Ear: External ear normal.     Nose: Nose normal.     Mouth/Throat:     Mouth: Mucous membranes are moist.     Pharynx: Oropharynx is clear.  Eyes:     Conjunctiva/sclera: Conjunctivae normal.  Neck:     Musculoskeletal: Neck supple.  Cardiovascular:     Rate and Rhythm: Normal rate and regular rhythm.     Pulses: Normal pulses.     Heart sounds: No murmur.  Pulmonary:     Effort: Pulmonary effort is normal. Tachypnea present. No respiratory distress.     Breath sounds: Normal breath sounds.  Abdominal:     Palpations: Abdomen is soft.     Tenderness: There is abdominal tenderness.  Feet:     Right foot:     Amputation: Right leg is amputated above knee.     Left foot:     Amputation: Left leg is amputated above knee.  Skin:    General: Skin is warm and dry.     Capillary Refill: Capillary refill takes less than 2 seconds.  Neurological:     Mental Status: He is alert.      ED Treatments / Results  Labs (all labs ordered are listed, but only abnormal results are displayed) Labs Reviewed  CBC WITH DIFFERENTIAL/PLATELET - Abnormal; Notable for the following components:      Result Value   WBC 14.7 (*)    RBC 2.71 (*)    Hemoglobin 7.6 (*)    HCT 27.0 (*)    MCHC 28.1 (*)    RDW 18.7 (*)    Neutro Abs 12.5 (*)    All other components within normal limits  COMPREHENSIVE METABOLIC PANEL - Abnormal; Notable for the following components:  Chloride 95 (*)    Glucose, Bld 142 (*)    Creatinine, Ser 1.91 (*)    Calcium 7.6 (*)    Total Protein 5.6 (*)    Albumin 2.3 (*)    GFR calc non Af Amer 34 (*)    GFR calc Af Amer 39 (*)    All other components within normal limits  BRAIN NATRIURETIC PEPTIDE - Abnormal; Notable for the following components:   B Natriuretic Peptide >4,500.0 (*)    All other components within normal limits  GLUCOSE, CAPILLARY - Abnormal; Notable for the following  components:   Glucose-Capillary 147 (*)    All other components within normal limits  POCT I-STAT TROPONIN I - Abnormal; Notable for the following components:   Troponin i, poc 0.11 (*)    All other components within normal limits  LIPASE, BLOOD  PROTIME-INR  URINALYSIS, ROUTINE W REFLEX MICROSCOPIC  BASIC METABOLIC PANEL  HEPATIC FUNCTION PANEL  I-STAT TROPONIN, ED    EKG EKG Interpretation  Date/Time:  Saturday January 14 2018 18:28:54 EST Ventricular Rate:  84 PR Interval:    QRS Duration: 177 QT Interval:  412 QTC Calculation: 419 R Axis:   101 Text Interpretation:  Atrial fibrillation Ventricular premature complex Right bundle branch block Repol abnrm suggests ischemia, diffuse leads Abnormal ekg Confirmed by Carmin Muskrat [phone]) on 01/14/2018 6:59:56 PM   Radiology Ct Abdomen Pelvis Wo Contrast  Result Date: 01/14/2018 CLINICAL DATA:  Pain all over. EXAM: CT ABDOMEN AND PELVIS WITHOUT CONTRAST TECHNIQUE: Multidetector CT imaging of the abdomen and pelvis was performed following the standard protocol without IV contrast. COMPARISON:  12/05/2017 FINDINGS: Lower chest: Heart is enlarged. Coronary artery calcification is evident. Central line tip identified distal SVC. Large right pleural effusion is associated with right lower lobe collapse/consolidation. There is a small left pleural effusion. Hepatobiliary: No focal abnormality in the liver on this study without intravenous contrast. Layering tiny gallstones evident. No intrahepatic or extrahepatic biliary dilation. Pancreas: Pancreas is diffusely atrophic with parenchymal calcification in the region the pancreatic head. Imaging features are stable and likely reflect chronic pancreatitis. Spleen: No focal mass lesion. No dilatation of the main duct. No intraparenchymal cyst. No peripancreatic edema. Adrenals/Urinary Tract: Right adrenal gland unremarkable. Stable 13 mm left adrenal nodule. Low attenuation of this nodule  suggests adrenal adenoma. Tiny stones noted in the left kidney. Right kidney unremarkable. No evidence for hydroureter. The urinary bladder appears normal for the degree of distention. Stomach/Bowel: Stomach is nondistended. No gastric wall thickening. No evidence of outlet obstruction. Duodenum is normally positioned as is the ligament of Treitz. No small bowel wall thickening. No small bowel dilatation. The terminal ileum is normal. The appendix is normal. No gross colonic mass. No colonic wall thickening. Vascular/Lymphatic: There is abdominal aortic atherosclerosis without aneurysm. There is no gastrohepatic or hepatoduodenal ligament lymphadenopathy. No intraperitoneal or retroperitoneal lymphadenopathy. No pelvic sidewall lymphadenopathy. Reproductive: The prostate gland and seminal vesicles have normal imaging features. Other: Small volume intraperitoneal free fluid evident. There is diffuse body wall edema associated with mesenteric and presacral edema. Musculoskeletal: Small left groin hernia contains only fat. No worrisome lytic or sclerotic osseous abnormality. IMPRESSION: 1. No acute findings in the abdomen or pelvis. 2. Large right pleural effusion with right lower lobe collapse/consolidation. 3. Small left pleural effusion. 4. Small volume intraperitoneal free fluid with diffuse body wall edema. 5. Cholelithiasis. 6. Stable left adrenal adenoma. Electronically Signed   By: Misty Stanley M.D.   On: 01/14/2018 20:12  Dg Chest 1 View  Result Date: 01/14/2018 CLINICAL DATA:  Shortness of breath, abdominal pain EXAM: CHEST  1 VIEW COMPARISON:  Portable exam 1856 hours compared to 12/11/2017 FINDINGS: RIGHT jugular line with tip projecting over SVC near cavoatrial junction. Enlargement of cardiac silhouette with pulmonary vascular congestion. Moderate to large RIGHT pleural effusion with basilar atelectasis. Underlying pulmonary edema. Atherosclerotic calcification aorta. No pneumothorax. Bones  demineralized. IMPRESSION: Enlargement of cardiac silhouette with pulmonary vascular congestion and mild pulmonary edema. Moderate to large RIGHT pleural effusion and basilar atelectasis increased since prior study. Electronically Signed   By: Lavonia Dana M.D.   On: 01/14/2018 19:11    Procedures Procedures (including critical care time)  Medications Ordered in ED Medications  aspirin EC tablet 81 mg (has no administration in time range)  atorvastatin (LIPITOR) tablet 20 mg (has no administration in time range)  carvedilol (COREG) tablet 3.125 mg (has no administration in time range)  isosorbide-hydrALAZINE (BIDIL) 20-37.5 MG per tablet 1 tablet (1 tablet Oral Given 01/14/18 2303)  ALPRAZolam Duanne Moron) tablet 0.5 mg (0.5 mg Oral Given 01/14/18 2303)  busPIRone (BUSPAR) tablet 2.5 mg (2.5 mg Oral Given 01/14/18 2304)  diazepam (VALIUM) tablet 2.5 mg (has no administration in time range)  cinacalcet (SENSIPAR) tablet 30 mg (has no administration in time range)  pantoprazole (PROTONIX) EC tablet 40 mg (has no administration in time range)  sevelamer carbonate (RENVELA) tablet 1,600 mg (has no administration in time range)  clopidogrel (PLAVIX) tablet 75 mg (has no administration in time range)  Melatonin TABS 3 mg (has no administration in time range)  gabapentin (NEURONTIN) capsule 100 mg (100 mg Oral Given 01/14/18 2304)  primidone (MYSOLINE) tablet 100 mg (has no administration in time range)  sodium chloride flush (NS) 0.9 % injection 3 mL (3 mLs Intravenous Given 01/14/18 2307)  sodium chloride flush (NS) 0.9 % injection 3 mL (has no administration in time range)  0.9 %  sodium chloride infusion (has no administration in time range)  acetaminophen (TYLENOL) tablet 650 mg (has no administration in time range)  ondansetron (ZOFRAN) injection 4 mg (has no administration in time range)  insulin aspart (novoLOG) injection 0-9 Units (has no administration in time range)  insulin aspart  (novoLOG) injection 0-5 Units (0 Units Subcutaneous Not Given 01/14/18 2306)  traMADol (ULTRAM) tablet 50 mg (has no administration in time range)  oxyCODONE (Oxy IR/ROXICODONE) immediate release tablet 5 mg (has no administration in time range)  heparin injection 5,000 Units (has no administration in time range)  oxyCODONE (Oxy IR/ROXICODONE) immediate release tablet 5 mg (5 mg Oral Given 01/14/18 2036)     Initial Impression / Assessment and Plan / ED Course  I have reviewed the triage vital signs and the nursing notes.  Pertinent labs & imaging results that were available during my care of the patient were reviewed by me and considered in my medical decision making (see chart for details).     Patient is an 76-year-old male who presents with above-stated history exam.  On presentation patient is afebrile with stable vital signs.  Exam as above remarkable for bilateral AKA's as well as diffuse abdominal tenderness.  Patient is also noted to be tachypneic.  Regarding the patient's tachypnea I have a low suspicion for acute cardiac ischemia given EKG shows A. fib with a ventricular rate of 84 with normal intervals and a right bundle branch block that is consistent with prior tracings and no other signs of acute skin change.  In addition  troponin is noted to be 0.11 which is the lowest it has been over serial levels 6 months ago, 8 months ago, and 1 year ago.  Concern for volume overload given chest x-ray does not show any focal consolidations but does show vascular congestion and pulmonary edema with a right-sided pleural effusion.  In addition this patient's BNP is noted to be greater than 4500.  This is compared to levels 8 months ago, 1 year ago that are both less than 1000.  Regarding the patient's abdominal pain.acute pancreatitis given lipase is 17.  Doubt have had a biliary pathology given absence of focal tenderness in the right upper quadrant as well as AST/ALT/alk phos WNL.  CT abdomen  remarkable for "Large right pleural effusion with right lower lobe Collapse/consolidation. Small left pleural effusion. Small volume intraperitoneal free fluid with diffuse body wall Edema. Cholelithiasis. Stable left adrenal adenoma."  Patient given p.o. analgesia in the emergency department.  Patient stated he was amenable to thoracentesis.  Patient admitted in stable condition for further evaluation and management likely to include thoracentesis.  Final Clinical Impressions(s) / ED Diagnoses   Final diagnoses:  Large pleural effusion    ED Discharge Orders    None       Hulan Saas, MD 01/14/18 [id number]    Carmin Muskrat, MD 01/15/18 1800

## 2018-01-15 ENCOUNTER — Observation Stay (HOSPITAL_COMMUNITY): Payer: Medicare Other

## 2018-01-15 ENCOUNTER — Encounter (HOSPITAL_COMMUNITY): Payer: Self-pay | Admitting: Nephrology

## 2018-01-15 ENCOUNTER — Other Ambulatory Visit: Payer: Self-pay

## 2018-01-15 DIAGNOSIS — E1122 Type 2 diabetes mellitus with diabetic chronic kidney disease: Secondary | ICD-10-CM

## 2018-01-15 DIAGNOSIS — G8929 Other chronic pain: Secondary | ICD-10-CM

## 2018-01-15 DIAGNOSIS — I5043 Acute on chronic combined systolic (congestive) and diastolic (congestive) heart failure: Secondary | ICD-10-CM

## 2018-01-15 DIAGNOSIS — Z992 Dependence on renal dialysis: Secondary | ICD-10-CM | POA: Diagnosis not present

## 2018-01-15 DIAGNOSIS — D638 Anemia in other chronic diseases classified elsewhere: Secondary | ICD-10-CM | POA: Diagnosis not present

## 2018-01-15 DIAGNOSIS — N186 End stage renal disease: Secondary | ICD-10-CM | POA: Diagnosis not present

## 2018-01-15 DIAGNOSIS — Z794 Long term (current) use of insulin: Secondary | ICD-10-CM

## 2018-01-15 DIAGNOSIS — I132 Hypertensive heart and chronic kidney disease with heart failure and with stage 5 chronic kidney disease, or end stage renal disease: Secondary | ICD-10-CM | POA: Diagnosis not present

## 2018-01-15 DIAGNOSIS — I509 Heart failure, unspecified: Secondary | ICD-10-CM | POA: Diagnosis not present

## 2018-01-15 LAB — BASIC METABOLIC PANEL
Anion gap: 13 (ref 5–15)
BUN: 28 mg/dL — AB (ref 8–23)
CO2: 28 mmol/L (ref 22–32)
Calcium: 7.7 mg/dL — ABNORMAL LOW (ref 8.9–10.3)
Chloride: 94 mmol/L — ABNORMAL LOW (ref 98–111)
Creatinine, Ser: 2.45 mg/dL — ABNORMAL HIGH (ref 0.61–1.24)
GFR calc Af Amer: 29 mL/min — ABNORMAL LOW (ref >60)
GFR calc non Af Amer: 25 mL/min — ABNORMAL LOW (ref >60)
Glucose, Bld: 249 mg/dL — ABNORMAL HIGH (ref 70–99)
Potassium: 4 mmol/L (ref 3.5–5.1)
Sodium: 135 mmol/L (ref 135–145)

## 2018-01-15 LAB — BODY FLUID CELL COUNT WITH DIFFERENTIAL
Lymphs, Fluid: 12 %
Monocyte-Macrophage-Serous Fluid: 61 % (ref 50–90)
NEUTROPHIL FLUID: 27 % — AB (ref 0–25)
Total Nucleated Cell Count, Fluid: 35 cu mm (ref 0–1000)

## 2018-01-15 LAB — HEPATIC FUNCTION PANEL
ALT: 23 U/L (ref 0–44)
AST: 24 U/L (ref 15–41)
Albumin: 2.2 g/dL — ABNORMAL LOW (ref 3.5–5.0)
Alkaline Phosphatase: 93 U/L (ref 38–126)
Bilirubin, Direct: 0.1 mg/dL (ref 0.0–0.2)
Indirect Bilirubin: 0.3 mg/dL (ref 0.3–0.9)
Total Bilirubin: 0.4 mg/dL (ref 0.3–1.2)
Total Protein: 5.4 g/dL — ABNORMAL LOW (ref 6.5–8.1)

## 2018-01-15 LAB — PHOSPHORUS: Phosphorus: 3.4 mg/dL (ref 2.5–4.6)

## 2018-01-15 LAB — CBC
HCT: 25.5 % — ABNORMAL LOW (ref 39.0–52.0)
Hemoglobin: 7.4 g/dL — ABNORMAL LOW (ref 13.0–17.0)
MCH: 28.8 pg (ref 26.0–34.0)
MCHC: 29 g/dL — ABNORMAL LOW (ref 30.0–36.0)
MCV: 99.2 fL (ref 80.0–100.0)
Platelets: 389 10*3/uL (ref 150–400)
RBC: 2.57 MIL/uL — ABNORMAL LOW (ref 4.22–5.81)
RDW: 19 % — AB (ref 11.5–15.5)
WBC: 9.6 10*3/uL (ref 4.0–10.5)
nRBC: 0 % (ref 0.0–0.2)

## 2018-01-15 LAB — PROTEIN, PLEURAL OR PERITONEAL FLUID

## 2018-01-15 LAB — GLUCOSE, CAPILLARY
Glucose-Capillary: 209 mg/dL — ABNORMAL HIGH (ref 70–99)
Glucose-Capillary: 181 mg/dL — ABNORMAL HIGH (ref 70–99)
Glucose-Capillary: 133 mg/dL — ABNORMAL HIGH (ref 70–99)
Glucose-Capillary: 161 mg/dL — ABNORMAL HIGH (ref 70–99)

## 2018-01-15 LAB — GRAM STAIN

## 2018-01-15 LAB — LACTATE DEHYDROGENASE, PLEURAL OR PERITONEAL FLUID: LD, Fluid: 52 U/L — ABNORMAL HIGH (ref 3–23)

## 2018-01-15 LAB — MRSA PCR SCREENING: MRSA by PCR: NEGATIVE

## 2018-01-15 MED ORDER — CHLORHEXIDINE GLUCONATE CLOTH 2 % EX PADS
6.0000 | MEDICATED_PAD | Freq: Every day | CUTANEOUS | Status: DC
  Administered 2018-01-16 – 2018-01-17 (×2): 6 via TOPICAL

## 2018-01-15 MED ORDER — INSULIN DETEMIR 100 UNIT/ML ~~LOC~~ SOLN
5.0000 [IU] | Freq: Every day | SUBCUTANEOUS | Status: DC
  Administered 2018-01-15 – 2018-01-16 (×2): 5 [IU] via SUBCUTANEOUS
  Filled 2018-01-15 (×3): qty 0.05

## 2018-01-15 MED ORDER — OXYCODONE HCL 5 MG PO TABS
5.0000 mg | ORAL_TABLET | ORAL | Status: DC | PRN
  Administered 2018-01-15 – 2018-01-17 (×7): 10 mg via ORAL
  Filled 2018-01-15 (×5): qty 2

## 2018-01-15 MED ORDER — MIDODRINE HCL 5 MG PO TABS: 10.0000 mg | ORAL_TABLET | ORAL | Status: DC

## 2018-01-15 MED ORDER — OXYCODONE HCL 5 MG PO TABS: 5.0000 mg | ORAL_TABLET | Freq: Four times a day (QID) | ORAL | Status: DC | PRN

## 2018-01-15 MED ORDER — INSULIN DETEMIR 100 UNIT/ML FLEXPEN: 5.0000 [IU] | PEN_INJECTOR | Freq: Every day | SUBCUTANEOUS | Status: DC

## 2018-01-15 MED ORDER — ACETAMINOPHEN 500 MG PO TABS
1000.0000 mg | ORAL_TABLET | Freq: Three times a day (TID) | ORAL | Status: DC
  Administered 2018-01-15 – 2018-01-17 (×6): 1000 mg via ORAL
  Filled 2018-01-15 (×6): qty 2

## 2018-01-15 MED ORDER — GABAPENTIN 300 MG PO CAPS
300.0000 mg | ORAL_CAPSULE | Freq: Every day | ORAL | Status: DC
  Administered 2018-01-15 – 2018-01-16 (×2): 300 mg via ORAL
  Filled 2018-01-15 (×2): qty 1

## 2018-01-15 MED ORDER — MIDODRINE HCL 5 MG PO TABS
10.0000 mg | ORAL_TABLET | ORAL | Status: AC
  Administered 2018-01-16: 10 mg via ORAL

## 2018-01-15 MED ORDER — IOPAMIDOL (ISOVUE-370) INJECTION 76%
INTRAVENOUS | Status: AC
  Filled 2018-01-15: qty 100

## 2018-01-15 MED ORDER — LIDOCAINE HCL (PF) 1 % IJ SOLN
INTRAMUSCULAR | Status: AC
  Filled 2018-01-15: qty 30

## 2018-01-15 NOTE — Progress Notes (Addendum)
Received a call from tele regarding a 2.15 sec SVR pause. RN checked on pt. Pt was coughing upon entering room. Pt stated he feels okay, just sleepy and asked this RN to reposition his head. Pt denies any pain, discomfort, or SOB. Made MD aware. Will continue to monitor pt.   Eleanora Neighbor, RN

## 2018-01-15 NOTE — Progress Notes (Signed)
Patient transferred to low air mattress bed but after an hour patient started yelling to remove him from the bed, tried to explain to the patient benefits of air mattress for his pressure ulcer on his bottom,  but patient was very adamant to transfer him back to regular bed.  He was screaming, yelling and restless.  Patient transferred back to the regular bed.

## 2018-01-15 NOTE — Consult Note (Addendum)
Renal Service Consult Note [facility]  Mitchell Garcia 01/15/2018 Sol Blazing Requesting Physician:  Dr Eliseo Squires  Reason for Consult:  ESRD pt from [facility] w/ vol overload HPI: The patient is a 72 y.o. year-old with hx of CVA, MI/CAD sp stenting (not cabg candidate), ICM, HTN, DM2, combined sdCHF, PAD old R BKA and recent L BKA who presented on 12/21 with SOB and abd pain. Was admitted earlier this week to [facility] w/ hypervolemia got HD x 2 and refused the 3rd session and was dc'd. Denied chest pain. Had dialysis on the same day in the morning that he arrived at the ED (Saturday, yesterday 12/21).  Next HD is due tomorrow.  In ED w/u showed a large R effusion on CXR and pt went for thoracentesis done by Radiology , 2L clear fluid. Post CXR showed resolution of most of the effusion, no edema. We are asked to see for ESRD.    Pt is uncomfortable and a vague historian, does state he has been having abd pain he thinks is from the phos binders.  Has been having some constipation issues.  No diarrhea, no fevers. No abd pain at this moment. Spoke w/ daughter who states over last 4-6 wks patient has been doing poorly , "not himself", yelling out a lot, less conversant, more fatigued.  Has had 2-3 episodes of severe abd pain while on HD towards the end of the session, and has had abd pain at other times as well. No hx of vomiting.   CT abd yest showed >  1. No acute findings in the abdomen or pelvis. 2. Large right pleural effusion with right lower lobe collapse/consolidation. 3. Small left pleural effusion. 4. Small volume intraperitoneal free fluid with diffuse body wall edema. 5. Cholelithiasis. 6. Stable left adrenal adenoma.  Last ECHO 12/08/17 > - Left ventricle: The cavity size was normal. Wall thickness was increased in a pattern of mild LVH. Systolic function was severely reduced. The estimated ejection fraction was in the range of 25% to 30%. Akinesis of the  basal-midinferior myocardium. - Aortic valve: There was mild stenosis. Valve area (Vmax): 3.07 cm^2. - Left atrium: The atrium was mildly dilated. - Right ventricle: The cavity size was moderately dilated. - Right atrium: The atrium was moderately dilated. - Pulmonary arteries: Systolic pressure was moderately increased. PA peak pressure: 44 mm Hg (S).  ROS  denies CP  no joint pain   no HA  no blurry vision  no rash  no diarrhea  no nausea/ vomiting  Past Medical History  Past Medical History:  Diagnosis Date  . Acute kidney injury superimposed on chronic kidney disease ([place]) 10/15/2016  . Altered mental state 01/25/2013  . Anemia of chronic disease 01/25/2013  . CAD S/P percutaneous coronary angioplasty    a.not to be a candidate for CABG. On 9/19, went back to cath lab for PCI s/p orbital atherectomy with DES to ostial RCA, and PTCA/DES to distal RCA. Residual 50% prox LAD, 70% D1, CTO mLCx noted by initial diagnostic cath 10/08/16 to be treated medically.  . Cancer ([place])   . Cellulitis 07/2017   right leg  . Cellulitis of right lower extremity 07/25/2017  . Chronic combined systolic and diastolic CHF (congestive heart failure) ([place])   . CKD (chronic kidney disease), stage IV ([place])   . Diabetes mellitus with renal complications (HCC)    uncontrolled  . Essential hypertension 10/15/2016  . Humerus head fracture 01/27/2013  . Hyperlipidemia   .  Hyponatremia 01/25/2013  . Insulin dependent diabetes mellitus ([place])   . Ischemic cardiomyopathy 10/15/2016  . Left adrenal mass ([place]) 12/05/2017  . MI (myocardial infarction) ([place])    years ago  . Stroke ([place])   . Transaminitis 01/25/2013  . Uncontrolled diabetes mellitus ([place]) 10/15/2016   Past Surgical History  Past Surgical History:  Procedure Laterality Date  . COLONOSCOPY WITH PROPOFOL N/A 08/09/2017   Procedure: COLONOSCOPY WITH PROPOFOL Hemostatic Clips;  Surgeon: Clarene Essex, MD;  Location: [place];  Service: Endoscopy;   Laterality: N/A;  . CORONARY STENT INTERVENTION N/A 10/13/2016   Procedure: CORONARY STENT INTERVENTION;  Surgeon: Burnell Blanks, MD;  Location: [place] [facility];  Service: Cardiovascular;  Laterality: N/A;  . ESOPHAGOGASTRODUODENOSCOPY (EGD) WITH PROPOFOL N/A 08/08/2017   Procedure: ESOPHAGOGASTRODUODENOSCOPY (EGD) WITH PROPOFOL;  Surgeon: Clarene Essex, MD;  Location: [place];  Service: Endoscopy;  Laterality: N/A;  . ESOPHAGOGASTRODUODENOSCOPY (EGD) WITH PROPOFOL N/A 12/07/2017   Procedure: ESOPHAGOGASTRODUODENOSCOPY (EGD) WITH PROPOFOL;  Surgeon: Clarene Essex, MD;  Location: [place];  Service: Endoscopy;  Laterality: N/A;  . IR FLUORO GUIDE CV LINE RIGHT  05/19/2017  . IR FLUORO GUIDE CV LINE RIGHT  12/14/2017  . IR US GUIDE VASC ACCESS RIGHT  05/19/2017  . LEFT HEART CATH AND CORONARY ANGIOGRAPHY N/A 10/08/2016   Procedure: LEFT HEART CATH AND CORONARY ANGIOGRAPHY;  Surgeon: Nelva Bush, MD;  Location: [place] [facility];  Service: Cardiovascular;  Laterality: N/A;  . SCHLEROTHERAPY  08/09/2017   Procedure: Woodward Ku OF AVM;  Surgeon: Clarene Essex, MD;  Location: [place];  Service: Endoscopy;;  . SKIN CANCER EXCISION    . TEMPORARY PACEMAKER N/A 10/13/2016   Procedure: Temporary Pacemaker;  Surgeon: Burnell Blanks, MD;  Location: [place] [facility];  Service: Cardiovascular;  Laterality: N/A;   Family History  Family History  Problem Relation Age of Onset  . Hyperlipidemia Father    Social History  reports that he has never smoked. He has never used smokeless tobacco. He reports that he does not drink alcohol or use drugs. Allergies  Allergies  Allergen Reactions  . Oxycodone Other (See Comments)    Renders the patient DIFFICULT TO WAKE UP  . Tramadol Nausea And Vomiting  . Lisinopril Other (See Comments)    Hyperkalemia   . Losartan Other (See Comments)    Hyperkalemia    Home medications Prior to Admission medications   Medication Sig  Start Date End Date Taking? Authorizing Provider  acetaminophen (TYLENOL) 325 MG tablet Take 2 tablets (650 mg total) by mouth every 4 (four) hours as needed for mild pain, fever or headache. 05/24/17  Yes Emokpae, Courage, MD  Acetylcarnitine HCl (ACETYL L-CARNITINE PO) Take 2 capsules by mouth daily.   Yes [provider]  Acetylcarnitine HCl (ACETYL-L-CARNITINE HCL) POWD Take 1 capsule by mouth daily.   Yes [provider]  aspirin EC 81 MG tablet Take 81 mg by mouth daily.   Yes [provider]  atorvastatin (LIPITOR) 20 MG tablet Take 1 tablet (20 mg total) by mouth daily at 6 PM. 12/13/17  Yes Mariel Aloe, MD  bumetanide (BUMEX) 2 MG tablet Take 4 mg by mouth 2 (two) times daily.   Yes [provider]  busPIRone (BUSPAR) 5 MG tablet Take 2.5 mg by mouth 2 (two) times daily.   Yes [provider]  Ca Carbonate-Mag Hydroxide 550-110 MG CHEW Chew 1-2 tablets by mouth every 4 (four) hours as needed (for indigestion).  Yes [provider]  carvedilol (COREG) 3.125 MG tablet Take 3.125 mg by mouth See admin instructions. Take 3.125 mg by mouth two times day and hold for a heart rate <42 or Systolic b/p reading <683   Yes [provider]  Cholecalciferol (VITAMIN D) 50 MCG (2000 UT) tablet Take 2,000 Units by mouth daily.   Yes [provider]  Cholecalciferol (VITAMIN D3) 50 MCG (2000 UT) TABS Take 2,000 Units by mouth daily.   Yes [provider]  cinacalcet (SENSIPAR) 30 MG tablet Take 30 mg by mouth daily.   Yes [provider]  clopidogrel (PLAVIX) 75 MG tablet Take 75 mg by mouth daily.   Yes [provider]  diazepam (VALIUM) 5 MG tablet Take 2.5 mg by mouth every 8 (eight) hours as needed for muscle spasms.   Yes [provider]  docusate sodium 100 MG CAPS Take 100 mg by mouth 2 (two) times daily. 02/01/13  Yes Theodis Blaze, MD  gabapentin (NEURONTIN) 100 MG capsule Take 1 capsule  (100 mg total) by mouth at bedtime. 12/13/17  Yes Mariel Aloe, MD  Insulin Detemir (LEVEMIR) 100 UNIT/ML Pen Inject 5 Units into the skin at bedtime.   Yes [provider]  isosorbide-hydrALAZINE (BIDIL) 20-37.5 MG tablet Take 1 tablet by mouth 2 (two) times daily. 05/24/17  Yes Emokpae, Courage, MD  ketoconazole (NIZORAL) 2 % cream Apply 1 application topically 2 (two) times daily as needed (to dry areas of skin).    Yes [provider]  ketorolac (ACULAR) 0.5 % ophthalmic solution Place 1 drop into the left eye 4 (four) times daily as needed (AS DIRECTED).    Yes [provider]  lactobacillus acidophilus (BACID) TABS tablet Take 2 tablets by mouth daily.    Yes [provider]  magic mouthwash SOLN 5 mLs 4 (four) times daily as needed for mouth pain (for 7 days, as directed).   Yes [provider]  Melatonin 3 MG CAPS Take 3 mg by mouth at bedtime as needed (for insomnia).    Yes [provider]  midodrine (PROAMATINE) 5 MG tablet Take 5 mg by mouth 3 (three) times daily with meals.    Yes [provider]  nitroGLYCERIN (NITROSTAT) 0.4 MG SL tablet Place 1 tablet (0.4 mg total) under the tongue every 5 (five) minutes x 3 doses as needed for chest pain. 10/18/16  Yes Cheryln Manly, NP  Nutritional Supplements (NEPRO) LIQD Take 237 mLs by mouth 3 (three) times daily with meals.   Yes [provider]  ondansetron (ZOFRAN) 4 MG tablet Take 4 mg by mouth every 8 (eight) hours as needed for nausea or vomiting (x72 hours. If not effective after 2 doses, increase to 8 mg).   Yes [provider]  oxyCODONE (OXY IR/ROXICODONE) 5 MG immediate release tablet Take 5 mg by mouth See admin instructions. Take 5 mg by mouth once a day and an additional 5 mg every six hours as needed for pain   Yes [provider]  pantoprazole (PROTONIX) 40 MG tablet Take 1 tablet (40 mg total) by mouth daily. 12/13/17  Yes Mariel Aloe, MD  primidone (MYSOLINE) 50 MG tablet Take 100 mg by mouth daily.   Yes [provider]  sevelamer carbonate (RENVELA) 800 MG tablet Take 2 tablets (1,600 mg total) by mouth 3 (three) times daily with meals. Patient taking differently: Take 800 mg by mouth 3 (three) times daily with meals.  07/10/17  Yes Diallo, Abdoulaye, MD  traMADol (ULTRAM) 50 MG tablet Take 50 mg by mouth 3 (three) times daily as needed (for pain).   Yes [provider]  vitamin B-12 (CYANOCOBALAMIN) 500 MCG tablet Take 1,000 mcg by mouth daily.   Yes [provider]  ALPRAZolam Duanne Moron) 0.5 MG tablet Take 1 tablet (0.5 mg total) by mouth at bedtime as needed for anxiety. Patient not taking: Reported on 01/15/2018 12/13/17   Mariel Aloe, MD  multivitamin (RENA-VIT) TABS tablet Take 1 tablet by mouth at bedtime. Patient not taking: Reported on 01/15/2018 05/24/17   Roxan Hockey, MD  OXYGEN Inhale 2 L into the lungs as needed (shortness of breath).    [provider]   Liver Function Tests Recent Labs  Lab 01/14/18 1928 01/15/18 0750  AST 25 24  ALT 22 23  ALKPHOS 94 93  BILITOT 0.7 0.4  PROT 5.6* 5.4*  ALBUMIN 2.3* 2.2*   Recent Labs  Lab 01/14/18 1928  LIPASE 17   CBC Recent Labs  Lab 01/14/18 1928 01/15/18 0752  WBC 14.7* 9.6  NEUTROABS 12.5*  --   HGB 7.6* 7.4*  HCT 27.0* 25.5*  MCV 99.6 99.2  PLT 389 950   Basic Metabolic Panel Recent Labs  Lab 01/14/18 1928 01/15/18 0750  NA 135 135  K 3.9 4.0  CL 95* 94*  CO2 25 28  GLUCOSE 142* 249*  BUN 19 28*  CREATININE 1.91* 2.45*  CALCIUM 7.6* 7.7*   Iron/TIBC/Ferritin/ %Sat    Component Value Date/Time   IRON 29 (L) 08/06/2017 0420   TIBC 182 (L) 08/06/2017 0420   FERRITIN 412 (H) 08/06/2017 0420   IRONPCTSAT 16 (L) 08/06/2017 0420    Vitals:   01/15/18 0451 01/15/18 1007 01/15/18 1048 01/15/18 1203  BP: (!) 150/58 (!) 107/55 113/65 116/66  Pulse: (!) 56   60  Resp: 20   18  Temp: 98.6 F  (37 C)   98.6 F (37 C)  TempSrc:    Oral  SpO2: 100%   100%   Exam Gen restless, Ox 2, no distress, lying at 30deg, pale, chron ill appearing and fatigued No rash, cyanosis or gangrene Sclera anicteric, throat clear and moist  No jvd or bruit Chest clear bilat to base on L, dec'd slightly R base RRR no MRG Abd soft ntnd no mass or ascites +bs obese GU normal male MS bilat BKA, R stump healed, L stump w/ dressing in place Ext no stump edema, no wounds or ulcers.  R arm 2-3+ edema throughout R forearm aVF +loud bruit R IJ TDC in place, clean exit site Neuro is awake, nonfocal exam, no asterixis    Home meds:  - aspirin 81/ atorvastatin 20 hs/ clopidogrel 75 qd/ sl nitroglycerin prn  - midodrine 5 tid  - bumetanide 4mg  bid/ carvedilol 3.125 bid/ isosorbide-hydralazine 20-37.5 bid  - alprazolam 0.5 prn/ tramadol 50 tid prn/ oxycodone IR prn/ buspirone 2.5 bid/ diazepam prn  - cinacalcet 30 qd/ sevelamer carbonate 1.6gm ac tid  - gabapentin 100 hs/ mysoline 100 qd  - docusate sodium 100 bid/ nepro prn/ pantoprazole 40 qd  - home O2 2L  - insulin detemir 5u hs  - prns/ vitamins/ eyedrops/ supplements   [facility] [place]  TTS  4h  Unknown other variables    wt today= 77kg    Impression/ Plan: 1. SOB/ R pleural effusion - sp thora yesterday 2L off. On CXR /exam no pulm edema but  prob has still some element of vol overload. BP's are good. Plan HD tomorrow on holiday sched, UF3-4 L as tolerated. Get OP HD info from [place] tomorrow if possible. 2. ESRD on HD TTS - started around April 2018, [place] unit in [place] 3. Abd pain - intermittent, pt thinks binders may be the cause. Also has been associated HD treatments per the family, ?intestinal angina. Plan hold renvela, check phos level and consider substitute binder, check CT angio of abdomen 4. RUE edema - this is focal and prob related to central venous stenosis/ occlusion, has TDC on that side. Consider eval while here  vs defer to his vasc access team ([facility] vasc surgery per family).   5. PAD sp recent L BKA + past h/o R BKA 6. CAD sp stenting 7. HTN - on midodrine and coreg/ bidil?, will change midodrine to pre HD tiw 8. MBD ckd - cont cinacalcet, binders as above 9. Anemia ckd - pt anemic, Hb 7's, get esa info and check fe/ tibc, stool guiac 10. DM2 11. CHF chron syst/ diast -  EF 25-30% 12. FTT - might consider GOC while here     Kelly Splinter MD [place] pager [phone]   01/15/2018, 6:12 PM

## 2018-01-15 NOTE — Progress Notes (Signed)
New Admission Note:  Arrival Method: By bed from ED around 2300 Mental Orientation: Alert and oriented Telemetry: Box 12, CCMD notified Assessment: Completed Skin: Completed, refer to flowsheets IV: Left hand Pain: all over pain 10/10 Tubes: None Safety Measures: Safety Fall Prevention Plan was given, discussed. Admission: Completed [facility] Orientation: Patient has been orientated to the room, unit and the staff. Family: None  Orders have been reviewed and implemented. Will continue to monitor the patient. Call light has been placed within reach and bed alarm has been activated.   Perry Mount, RN  Phone Number: [phone]

## 2018-01-15 NOTE — Progress Notes (Addendum)
Progress Note    Mitchell Garcia  ZOX:[id number] DOB: Jan 02, 1946  DOA: 01/14/2018 PCP: [name], MD    Brief Narrative:     Medical records reviewed and are as summarized below:  Mitchell Garcia is an 72 y.o. male with medical history significant for end-stage renal disease on hemodialysis, coronary artery disease with stent, chronic systolic CHF, chronic normocytic anemia, chronic pain, and recent left BKA, now presenting to the emergency department from his dialysis session with complaints of increased shortness of breath and increased pain in multiple locations.  2 recent hospitalizations-- 1 at [facility]  For volume overload discharged 2 days prior and 1 at [facility] for [place] in early December.  Assessment/Plan:   Principal Problem:   Volume overload Active Problems:   Anemia of chronic disease   CAD S/P percutaneous coronary angioplasty   DM (diabetes mellitus), type 2 with renal complications (HCC)   Acute on chronic combined systolic and diastolic CHF (congestive heart failure) (HCC)   ESRD on dialysis ([place])   Pressure injury of skin   Large pleural effusion   Chronic pain   Chronic respiratory failure with hypoxia (HCC)   Unspecified atrial fibrillation (HCC)   Acute on chronic combined systolic and diastolic CHF  - EF was 81-19% last month - Only makes a few mL of urine every couple days so diuretics unlikely to help much  - Continue Coreg and Bidil as tolerated   -volume managed with HD   ESRD  - Completed HD just prior to arrival in ED  - Dialyzed 12/17 and 12/18 at [facility], was advised to undergo HD again on 12/19 d/t ongoing hypervolemia but refused  - nephrology consulted-- plan for HD in AM (holiday schedule)  Pleural effusions  - Treated for HCAP last month, no infectious sxs currently, likely secondary to vol o/l from #'s 2 & 3 - Large effusion on left with increased SOB -s/p thoracentesis- labs pending  Atrial fibrillation  - In  rate-controlled a fib on admission, noted during admission here last month and during admission earlier this week at [facility] - CHADS-VASc at least 4 (age, CHF, CAD, DM)  - Not likely to be candidate for anticoagulation given chronic GI blood-loss and admission last month with symptomatic anemia  - Continue DAPT and beta-blocker for now   Insulin-dependent DM  - A1c was 7.3% in July  - Managed oupt with Levemir 5 units qHS  - Check CBG's and use a low-intensity SSI only for now     CAD - Denies any anginal sxs since PCI in September 2018  - ST-T abnormalities on EKG appear similar to prior  - Continue medical tx with DAPT, statin, nitrates, beta-blocker     acute on Chronic pain  - Patient reports increased in chronic back and knee pain, also reporting lower abdominal pain with benign exam and CT abd/pelvis with no acute findings  -increase gabapentin -increase oxycodone (per chart review and family report, he does not tolerate morphine well due to AMS/lethargy) -x ray of stump to see if any sign of osteo or any other issue that could be causing pain  Pressure ulcers -wound care consult -present on admission in multiple areas    Anemia  - Hgb appears stable at 7.6  - Likely secondary to CKD and chronic GI blood-loss, managed with PPI, Aranesp and nutritional supplements    Family Communication/Anticipated D/C date and plan/Code Status   DVT prophylaxis: SQ heparin Code Status: Full Code.  Family Communication:  Disposition Plan:    Medical Consultants:    IR     Subjective:   C/o stump pain  Objective:    Vitals:   01/15/18 0451 01/15/18 1007 01/15/18 1048 01/15/18 1203  BP: (!) 150/58 (!) 107/55 113/65 116/66  Pulse: (!) 56   60  Resp: 20   18  Temp: 98.6 F (37 C)   98.6 F (37 C)  TempSrc:    Oral  SpO2: 100%   100%    Intake/Output Summary (Last 24 hours) at 01/15/2018 1336 Last data filed at 01/15/2018 1204 Gross per 24 hour  Intake 240 ml    Output 0 ml  Net 240 ml   There were no vitals filed for this visit.  Exam: Yelling out from bed for "help" Diminished breath sounds Right stump with blood on dressing Alert but uncomfortable appearing Catheter on left chest wall  Data Reviewed:   I have personally reviewed following labs and imaging studies:  Labs: Labs show the following:   Basic Metabolic Panel: Recent Labs  Lab 01/14/18 1928 01/15/18 0750  NA 135 135  K 3.9 4.0  CL 95* 94*  CO2 25 28  GLUCOSE 142* 249*  BUN 19 28*  CREATININE 1.91* 2.45*  CALCIUM 7.6* 7.7*   GFR CrCl cannot be calculated (Unknown ideal weight.). Liver Function Tests: Recent Labs  Lab 01/14/18 1928 01/15/18 0750  AST 25 24  ALT 22 23  ALKPHOS 94 93  BILITOT 0.7 0.4  PROT 5.6* 5.4*  ALBUMIN 2.3* 2.2*   Recent Labs  Lab 01/14/18 1928  LIPASE 17   No results for input(s): AMMONIA in the last 168 hours. Coagulation profile Recent Labs  Lab 01/14/18 1928  INR 1.10    CBC: Recent Labs  Lab 01/14/18 1928 01/15/18 0752  WBC 14.7* 9.6  NEUTROABS 12.5*  --   HGB 7.6* 7.4*  HCT 27.0* 25.5*  MCV 99.6 99.2  PLT 389 389   Cardiac Enzymes: No results for input(s): CKTOTAL, CKMB, CKMBINDEX, TROPONINI in the last 168 hours. BNP (last 3 results) No results for input(s): PROBNP in the last 8760 hours. CBG: Recent Labs  Lab 01/14/18 2255 01/15/18 0719 01/15/18 1158  GLUCAP 147* 209* 181*   D-Dimer: No results for input(s): DDIMER in the last 72 hours. Hgb A1c: No results for input(s): HGBA1C in the last 72 hours. Lipid Profile: No results for input(s): CHOL, HDL, LDLCALC, TRIG, CHOLHDL, LDLDIRECT in the last 72 hours. Thyroid function studies: No results for input(s): TSH, T4TOTAL, T3FREE, THYROIDAB in the last 72 hours.  Invalid input(s): FREET3 Anemia work up: No results for input(s): VITAMINB12, FOLATE, FERRITIN, TIBC, IRON, RETICCTPCT in the last 72 hours. Sepsis Labs: Recent Labs  Lab  01/14/18 1928 01/15/18 0752  WBC 14.7* 9.6    Microbiology Recent Results (from the past 240 hour(s))  MRSA PCR Screening     Status: None   Collection Time: 01/14/18 11:41 PM  Result Value Ref Range Status   MRSA by PCR NEGATIVE NEGATIVE Final    Comment:        The GeneXpert MRSA Assay (FDA approved for NASAL specimens only), is one component of a comprehensive MRSA colonization surveillance program. It is not intended to diagnose MRSA infection nor to guide or monitor treatment for MRSA infections. Performed at [facility], [place] [address] [place],  [address]     Procedures and diagnostic studies:  Ct Abdomen Pelvis Wo Contrast  Result Date:  01/14/2018 CLINICAL DATA:  Pain all over. EXAM: CT ABDOMEN AND PELVIS WITHOUT CONTRAST TECHNIQUE: Multidetector CT imaging of the abdomen and pelvis was performed following the standard protocol without IV contrast. COMPARISON:  12/05/2017 FINDINGS: Lower chest: Heart is enlarged. Coronary artery calcification is evident. Central line tip identified distal SVC. Large right pleural effusion is associated with right lower lobe collapse/consolidation. There is a small left pleural effusion. Hepatobiliary: No focal abnormality in the liver on this study without intravenous contrast. Layering tiny gallstones evident. No intrahepatic or extrahepatic biliary dilation. Pancreas: Pancreas is diffusely atrophic with parenchymal calcification in the region the pancreatic head. Imaging features are stable and likely reflect chronic pancreatitis. Spleen: No focal mass lesion. No dilatation of the main duct. No intraparenchymal cyst. No peripancreatic edema. Adrenals/Urinary Tract: Right adrenal gland unremarkable. Stable 13 mm left adrenal nodule. Low attenuation of this nodule suggests adrenal adenoma. Tiny stones noted in the left kidney. Right kidney unremarkable. No evidence for hydroureter. The urinary bladder appears normal for the degree  of distention. Stomach/Bowel: Stomach is nondistended. No gastric wall thickening. No evidence of outlet obstruction. Duodenum is normally positioned as is the ligament of Treitz. No small bowel wall thickening. No small bowel dilatation. The terminal ileum is normal. The appendix is normal. No gross colonic mass. No colonic wall thickening. Vascular/Lymphatic: There is abdominal aortic atherosclerosis without aneurysm. There is no gastrohepatic or hepatoduodenal ligament lymphadenopathy. No intraperitoneal or retroperitoneal lymphadenopathy. No pelvic sidewall lymphadenopathy. Reproductive: The prostate gland and seminal vesicles have normal imaging features. Other: Small volume intraperitoneal free fluid evident. There is diffuse body wall edema associated with mesenteric and presacral edema. Musculoskeletal: Small left groin hernia contains only fat. No worrisome lytic or sclerotic osseous abnormality. IMPRESSION: 1. No acute findings in the abdomen or pelvis. 2. Large right pleural effusion with right lower lobe collapse/consolidation. 3. Small left pleural effusion. 4. Small volume intraperitoneal free fluid with diffuse body wall edema. 5. Cholelithiasis. 6. Stable left adrenal adenoma. Electronically Signed   By: Misty Stanley M.D.   On: 01/14/2018 20:12   Dg Chest 1 View  Result Date: 01/15/2018 CLINICAL DATA:  Post thoracentesis EXAM: CHEST  1 VIEW COMPARISON:  01/14/2018 FINDINGS: RIGHT jugular dual-lumen central venous catheter with tip projecting over SVC. Enlargement of cardiac silhouette with pulmonary vascular congestion. Decreased RIGHT pleural effusion and basilar atelectasis since previous exam. No pneumothorax post thoracentesis. Bones demineralized with old posttraumatic deformity of the proximal RIGHT humerus. Atherosclerotic calcification aortic arch. IMPRESSION: Decreased RIGHT pleural effusion and basilar atelectasis post thoracentesis without pneumothorax. Enlargement of cardiac  silhouette with pulmonary vascular congestion. Electronically Signed   By: Lavonia Dana M.D.   On: 01/15/2018 11:25   Dg Chest 1 View  Result Date: 01/14/2018 CLINICAL DATA:  Shortness of breath, abdominal pain EXAM: CHEST  1 VIEW COMPARISON:  Portable exam 1856 hours compared to 12/11/2017 FINDINGS: RIGHT jugular line with tip projecting over SVC near cavoatrial junction. Enlargement of cardiac silhouette with pulmonary vascular congestion. Moderate to large RIGHT pleural effusion with basilar atelectasis. Underlying pulmonary edema. Atherosclerotic calcification aorta. No pneumothorax. Bones demineralized. IMPRESSION: Enlargement of cardiac silhouette with pulmonary vascular congestion and mild pulmonary edema. Moderate to large RIGHT pleural effusion and basilar atelectasis increased since prior study. Electronically Signed   By: Lavonia Dana M.D.   On: 01/14/2018 19:11   Dg Femur Min 2 Views Left  Result Date: 01/15/2018 CLINICAL DATA:  Distal femoral pain EXAM: LEFT FEMUR 2 VIEWS COMPARISON:  None FINDINGS: Prior  above knee amputation. Osseous mineralization normal. No fracture, dislocation, or bone destruction. Skin clips at stump. Scattered vascular calcifications. IMPRESSION: Post above knee amputation. No acute abnormalities. Electronically Signed   By: Lavonia Dana M.D.   On: 01/15/2018 11:25   US Thoracentesis Asp Pleural Space W/img Guide  Result Date: 01/15/2018 INDICATION: Patient with history of heart failure, ESRD, volume overload, dyspnea, and right pleural effusion. Request is made for diagnostic and therapeutic right thoracentesis. EXAM: ULTRASOUND GUIDED DIAGNOSTIC AND THERAPEUTIC RIGHT THORACENTESIS MEDICATIONS: 10 mL of 1% lidocaine COMPLICATIONS: None immediate. PROCEDURE: An ultrasound guided thoracentesis was thoroughly discussed with the patient and questions answered. The benefits, risks, alternatives and complications were also discussed. The patient understands and wishes to  proceed with the procedure. Written consent was obtained. Ultrasound was performed to localize and mark an adequate pocket of fluid in the right chest. The area was then prepped and draped in the normal sterile fashion. 1% Lidocaine was used for local anesthesia. Under ultrasound guidance a 6 Fr Safe-T-Centesis catheter was introduced. Thoracentesis was performed. The catheter was removed and a dressing applied. FINDINGS: A total of approximately 2 L of clear yellow fluid was removed. Samples were sent to the laboratory as requested by the clinical team. IMPRESSION: Successful ultrasound guided right thoracentesis yielding 2 L of pleural fluid. Read by: Earley Abide, PA-C Electronically Signed   By: Aletta Edouard M.D.   On: 01/15/2018 11:36    Medications:   . acetaminophen  1,000 mg Oral TID  . aspirin EC  81 mg Oral Daily  . atorvastatin  20 mg Oral q1800  . busPIRone  2.5 mg Oral BID  . carvedilol  3.125 mg Oral BID WC  . cinacalcet  30 mg Oral Daily  . clopidogrel  75 mg Oral Daily  . gabapentin  300 mg Oral QHS  . heparin  5,000 Units Subcutaneous Q8H  . insulin aspart  0-5 Units Subcutaneous QHS  . insulin aspart  0-9 Units Subcutaneous TID WC  . isosorbide-hydrALAZINE  1 tablet Oral BID  . lidocaine (PF)      . pantoprazole  40 mg Oral Daily  . primidone  100 mg Oral Daily  . sevelamer carbonate  1,600 mg Oral TID WC  . sodium chloride flush  3 mL Intravenous Q12H   Continuous Infusions: . sodium chloride       LOS: 0 days   Geradine Girt  [facility]   *Please refer to [email or web address], password [email or web address] to get updated schedule on who will round on this patient, as hospitalists switch teams weekly. If 7PM-7AM, please contact night-coverage at [email or web address], password [email or web address] for any overnight needs.  01/15/2018, 1:36 PM

## 2018-01-15 NOTE — Procedures (Signed)
PROCEDURE SUMMARY:  Successful image-guided right thoracentesis. Yielded 2 liters of clear yellow fluid. Patient tolerated procedure well. No immediate complications.  Specimen was sent for labs. CXR ordered.  Claris Pong Carlis Burnsworth PA-C 01/15/2018 11:14 AM

## 2018-01-16 ENCOUNTER — Observation Stay (HOSPITAL_COMMUNITY): Payer: Medicare Other

## 2018-01-16 DIAGNOSIS — E8779 Other fluid overload: Secondary | ICD-10-CM | POA: Diagnosis not present

## 2018-01-16 DIAGNOSIS — Z992 Dependence on renal dialysis: Secondary | ICD-10-CM | POA: Diagnosis not present

## 2018-01-16 DIAGNOSIS — I2510 Atherosclerotic heart disease of native coronary artery without angina pectoris: Secondary | ICD-10-CM | POA: Diagnosis not present

## 2018-01-16 DIAGNOSIS — I132 Hypertensive heart and chronic kidney disease with heart failure and with stage 5 chronic kidney disease, or end stage renal disease: Secondary | ICD-10-CM | POA: Diagnosis not present

## 2018-01-16 DIAGNOSIS — Z9861 Coronary angioplasty status: Secondary | ICD-10-CM

## 2018-01-16 DIAGNOSIS — G8929 Other chronic pain: Secondary | ICD-10-CM | POA: Diagnosis not present

## 2018-01-16 DIAGNOSIS — E1122 Type 2 diabetes mellitus with diabetic chronic kidney disease: Secondary | ICD-10-CM | POA: Diagnosis not present

## 2018-01-16 DIAGNOSIS — N186 End stage renal disease: Secondary | ICD-10-CM | POA: Diagnosis not present

## 2018-01-16 DIAGNOSIS — I5043 Acute on chronic combined systolic (congestive) and diastolic (congestive) heart failure: Secondary | ICD-10-CM | POA: Diagnosis not present

## 2018-01-16 DIAGNOSIS — D638 Anemia in other chronic diseases classified elsewhere: Secondary | ICD-10-CM | POA: Diagnosis not present

## 2018-01-16 DIAGNOSIS — I509 Heart failure, unspecified: Secondary | ICD-10-CM | POA: Diagnosis not present

## 2018-01-16 LAB — GLUCOSE, CAPILLARY
GLUCOSE-CAPILLARY: 168 mg/dL — AB (ref 70–99)
GLUCOSE-CAPILLARY: 205 mg/dL — AB (ref 70–99)
Glucose-Capillary: 229 mg/dL — ABNORMAL HIGH (ref 70–99)
Glucose-Capillary: 176 mg/dL — ABNORMAL HIGH (ref 70–99)
Glucose-Capillary: 137 mg/dL — ABNORMAL HIGH (ref 70–99)

## 2018-01-16 LAB — CBC
HEMATOCRIT: 27.4 % — AB (ref 39.0–52.0)
Hemoglobin: 8 g/dL — ABNORMAL LOW (ref 13.0–17.0)
MCH: 29.3 pg (ref 26.0–34.0)
MCHC: 29.2 g/dL — ABNORMAL LOW (ref 30.0–36.0)
MCV: 100.4 fL — ABNORMAL HIGH (ref 80.0–100.0)
Platelets: 393 10*3/uL (ref 150–400)
RBC: 2.73 MIL/uL — ABNORMAL LOW (ref 4.22–5.81)
RDW: 19 % — ABNORMAL HIGH (ref 11.5–15.5)
WBC: 13.8 10*3/uL — ABNORMAL HIGH (ref 4.0–10.5)
nRBC: 0 % (ref 0.0–0.2)

## 2018-01-16 LAB — BASIC METABOLIC PANEL
Anion gap: 10 (ref 5–15)
BUN: 37 mg/dL — ABNORMAL HIGH (ref 8–23)
CO2: 29 mmol/L (ref 22–32)
Calcium: 7.5 mg/dL — ABNORMAL LOW (ref 8.9–10.3)
Chloride: 93 mmol/L — ABNORMAL LOW (ref 98–111)
Creatinine, Ser: 3.22 mg/dL — ABNORMAL HIGH (ref 0.61–1.24)
GFR calc Af Amer: 21 mL/min — ABNORMAL LOW (ref >60)
GFR calc non Af Amer: 18 mL/min — ABNORMAL LOW (ref >60)
Glucose, Bld: 287 mg/dL — ABNORMAL HIGH (ref 70–99)
Potassium: 4.2 mmol/L (ref 3.5–5.1)
Sodium: 132 mmol/L — ABNORMAL LOW (ref 135–145)

## 2018-01-16 LAB — IRON AND TIBC
Iron: 36 ug/dL — ABNORMAL LOW (ref 45–182)
Saturation Ratios: 17 % — ABNORMAL LOW (ref 17.9–39.5)
TIBC: 210 ug/dL — ABNORMAL LOW (ref 250–450)
UIBC: 174 ug/dL

## 2018-01-16 LAB — HEPATITIS B SURFACE ANTIGEN: Hepatitis B Surface Ag: NEGATIVE

## 2018-01-16 MED ORDER — OXYCODONE HCL 5 MG PO TABS
ORAL_TABLET | ORAL | Status: AC
  Filled 2018-01-16: qty 2

## 2018-01-16 MED ORDER — CHLORHEXIDINE GLUCONATE CLOTH 2 % EX PADS
6.0000 | MEDICATED_PAD | Freq: Every day | CUTANEOUS | Status: DC
  Administered 2018-01-17: 6 via TOPICAL

## 2018-01-16 MED ORDER — HEPARIN SODIUM (PORCINE) 1000 UNIT/ML IJ SOLN
INTRAMUSCULAR | Status: AC
  Administered 2018-01-16: 3000 [IU] via INTRAVENOUS_CENTRAL
  Filled 2018-01-16: qty 6

## 2018-01-16 MED ORDER — HEPARIN SODIUM (PORCINE) 1000 UNIT/ML DIALYSIS
3000.0000 [IU] | INTRAMUSCULAR | Status: AC | PRN
  Administered 2018-01-16 (×2): 3000 [IU] via INTRAVENOUS_CENTRAL
  Filled 2018-01-16 (×2): qty 3

## 2018-01-16 MED ORDER — MIDODRINE HCL 5 MG PO TABS
ORAL_TABLET | ORAL | Status: AC
  Filled 2018-01-16: qty 1

## 2018-01-16 MED ORDER — DARBEPOETIN ALFA 200 MCG/0.4ML IJ SOSY
200.0000 ug | PREFILLED_SYRINGE | INTRAMUSCULAR | Status: DC
  Administered 2018-01-16: 200 ug via INTRAVENOUS

## 2018-01-16 MED ORDER — DARBEPOETIN ALFA 200 MCG/0.4ML IJ SOSY
PREFILLED_SYRINGE | INTRAMUSCULAR | Status: AC
  Filled 2018-01-16: qty 0.4

## 2018-01-16 MED ORDER — IOPAMIDOL (ISOVUE-370) INJECTION 76%
80.0000 mL | Freq: Once | INTRAVENOUS | Status: AC | PRN
  Administered 2018-01-16: 80 mL via INTRAVENOUS

## 2018-01-16 NOTE — Progress Notes (Signed)
[facility] ROUNDING NOTE   Subjective:   No complaints this morning   blood pressure 103/59 pulse 68 temperature 98.4 O2 sats 90% 3 L nasal cannula  Sodium 132 potassium 4.2 chloride 93 CO2 29 glucose 287 BUN 37 creatinine 3.2 calcium 7.5 hemoglobin 8.0 WBC 13.8 platelets 393   Objective:  Vital signs in last 24 hours:  Temp:  [98 F (36.7 C)-98.6 F (37 C)] 98.4 F (36.9 C) (12/23 0735) Pulse Rate:  [54-100] 68 (12/23 1030) Resp:  [18-23] 23 (12/23 0735) BP: (96-136)/(45-83) 103/59 (12/23 1030) SpO2:  [92 %-100 %] 92 % (12/23 0735) Weight:  [77.2 kg-80.4 kg] 80.4 kg (12/23 0735)  Weight change:  Filed Weights   01/15/18 2151 01/16/18 0500 01/16/18 0735  Weight: 77.4 kg 77.4 kg 80.4 kg    Intake/Output: I/O last 3 completed shifts: In: 240 [P.O.:240] Out: 200 [Urine:200]   Intake/Output this shift:  No intake/output data recorded.  CVS- RRR no JVP RS- CTA slightly diminished breath sounds on right clear left ABD- BS present soft non-distended EXT-bilateral amputation BKA right BKA left   right forearm AV fistula right IJ [facility]   Basic Metabolic Panel: Recent Labs  Lab 01/14/18 1928 01/15/18 0750 01/16/18 0525  NA 135 135 132*  K 3.9 4.0 4.2  CL 95* 94* 93*  CO2 25 28 29   GLUCOSE 142* 249* 287*  BUN 19 28* 37*  CREATININE 1.91* 2.45* 3.22*  CALCIUM 7.6* 7.7* 7.5*  PHOS  --  3.4  --     Liver Function Tests: Recent Labs  Lab 01/14/18 1928 01/15/18 0750  AST 25 24  ALT 22 23  ALKPHOS 94 93  BILITOT 0.7 0.4  PROT 5.6* 5.4*  ALBUMIN 2.3* 2.2*   Recent Labs  Lab 01/14/18 1928  LIPASE 17   No results for input(s): AMMONIA in the last 168 hours.  CBC: Recent Labs  Lab 01/14/18 1928 01/15/18 0752 01/16/18 0811 01/16/18 0944  WBC 14.7* 9.6 QUESTIONABLE IDENTIFICATION / INCORRECTLY LABELED SPECIMEN 13.8*  NEUTROABS 12.5*  --   --   --   HGB 7.6* 7.4* QUESTIONABLE IDENTIFICATION / INCORRECTLY LABELED SPECIMEN 8.0*  HCT 27.0*  25.5* QUESTIONABLE IDENTIFICATION / INCORRECTLY LABELED SPECIMEN 27.4*  MCV 99.6 99.2 QUESTIONABLE IDENTIFICATION / INCORRECTLY LABELED SPECIMEN 100.4*  PLT 389 389 QUESTIONABLE IDENTIFICATION / INCORRECTLY LABELED SPECIMEN 393    Cardiac Enzymes: No results for input(s): CKTOTAL, CKMB, CKMBINDEX, TROPONINI in the last 168 hours.  BNP: Invalid input(s): POCBNP  CBG: Recent Labs  Lab 01/15/18 0719 01/15/18 1158 01/15/18 1623 01/15/18 2150 01/16/18 0817  GLUCAP 209* 181* 133* 161* 229*    Microbiology: Results for orders placed or performed during the hospital encounter of 01/14/18  MRSA PCR Screening     Status: None   Collection Time: 01/14/18 11:41 PM  Result Value Ref Range Status   MRSA by PCR NEGATIVE NEGATIVE Final    Comment:        The GeneXpert MRSA Assay (FDA approved for NASAL specimens only), is one component of a comprehensive MRSA colonization surveillance program. It is not intended to diagnose MRSA infection nor to guide or monitor treatment for MRSA infections. Performed at [facility], [place] [address] [place], [place] [address]   Gram stain     Status: None   Collection Time: 01/15/18 11:42 AM  Result Value Ref Range Status   Specimen Description PLEURAL RIGHT  Final   Special Requests NONE  Final   Gram Stain  Final    RARE WBC PRESENT, PREDOMINANTLY PMN NO ORGANISMS SEEN Performed at [facility] [address], [place], [place] [address]    Report Status 01/15/2018 FINAL  Final    Coagulation Studies: Recent Labs    01/14/18 1928  LABPROT 14.1  INR 1.10    Urinalysis: No results for input(s): COLORURINE, LABSPEC, PHURINE, GLUCOSEU, HGBUR, BILIRUBINUR, KETONESUR, PROTEINUR, UROBILINOGEN, NITRITE, LEUKOCYTESUR in the last 72 hours.  Invalid input(s): APPERANCEUR    Imaging: Ct Abdomen Pelvis Wo Contrast  Result Date: 01/14/2018 CLINICAL DATA:  Pain all over. EXAM: CT ABDOMEN AND PELVIS WITHOUT CONTRAST TECHNIQUE:  Multidetector CT imaging of the abdomen and pelvis was performed following the standard protocol without IV contrast. COMPARISON:  12/05/2017 FINDINGS: Lower chest: Heart is enlarged. Coronary artery calcification is evident. Central line tip identified distal SVC. Large right pleural effusion is associated with right lower lobe collapse/consolidation. There is a small left pleural effusion. Hepatobiliary: No focal abnormality in the liver on this study without intravenous contrast. Layering tiny gallstones evident. No intrahepatic or extrahepatic biliary dilation. Pancreas: Pancreas is diffusely atrophic with parenchymal calcification in the region the pancreatic head. Imaging features are stable and likely reflect chronic pancreatitis. Spleen: No focal mass lesion. No dilatation of the main duct. No intraparenchymal cyst. No peripancreatic edema. Adrenals/Urinary Tract: Right adrenal gland unremarkable. Stable 13 mm left adrenal nodule. Low attenuation of this nodule suggests adrenal adenoma. Tiny stones noted in the left kidney. Right kidney unremarkable. No evidence for hydroureter. The urinary bladder appears normal for the degree of distention. Stomach/Bowel: Stomach is nondistended. No gastric wall thickening. No evidence of outlet obstruction. Duodenum is normally positioned as is the ligament of Treitz. No small bowel wall thickening. No small bowel dilatation. The terminal ileum is normal. The appendix is normal. No gross colonic mass. No colonic wall thickening. Vascular/Lymphatic: There is abdominal aortic atherosclerosis without aneurysm. There is no gastrohepatic or hepatoduodenal ligament lymphadenopathy. No intraperitoneal or retroperitoneal lymphadenopathy. No pelvic sidewall lymphadenopathy. Reproductive: The prostate gland and seminal vesicles have normal imaging features. Other: Small volume intraperitoneal free fluid evident. There is diffuse body wall edema associated with mesenteric and  presacral edema. Musculoskeletal: Small left groin hernia contains only fat. No worrisome lytic or sclerotic osseous abnormality. IMPRESSION: 1. No acute findings in the abdomen or pelvis. 2. Large right pleural effusion with right lower lobe collapse/consolidation. 3. Small left pleural effusion. 4. Small volume intraperitoneal free fluid with diffuse body wall edema. 5. Cholelithiasis. 6. Stable left adrenal adenoma. Electronically Signed   By: Misty Stanley M.D.   On: 01/14/2018 20:12   Dg Chest 1 View  Result Date: 01/15/2018 CLINICAL DATA:  Post thoracentesis EXAM: CHEST  1 VIEW COMPARISON:  01/14/2018 FINDINGS: RIGHT jugular dual-lumen central venous catheter with tip projecting over SVC. Enlargement of cardiac silhouette with pulmonary vascular congestion. Decreased RIGHT pleural effusion and basilar atelectasis since previous exam. No pneumothorax post thoracentesis. Bones demineralized with old posttraumatic deformity of the proximal RIGHT humerus. Atherosclerotic calcification aortic arch. IMPRESSION: Decreased RIGHT pleural effusion and basilar atelectasis post thoracentesis without pneumothorax. Enlargement of cardiac silhouette with pulmonary vascular congestion. Electronically Signed   By: Lavonia Dana M.D.   On: 01/15/2018 11:25   Dg Chest 1 View  Result Date: 01/14/2018 CLINICAL DATA:  Shortness of breath, abdominal pain EXAM: CHEST  1 VIEW COMPARISON:  Portable exam 1856 hours compared to 12/11/2017 FINDINGS: RIGHT jugular line with tip projecting over SVC near cavoatrial junction. Enlargement of cardiac  silhouette with pulmonary vascular congestion. Moderate to large RIGHT pleural effusion with basilar atelectasis. Underlying pulmonary edema. Atherosclerotic calcification aorta. No pneumothorax. Bones demineralized. IMPRESSION: Enlargement of cardiac silhouette with pulmonary vascular congestion and mild pulmonary edema. Moderate to large RIGHT pleural effusion and basilar atelectasis  increased since prior study. Electronically Signed   By: Lavonia Dana M.D.   On: 01/14/2018 19:11   Dg Femur Min 2 Views Left  Result Date: 01/15/2018 CLINICAL DATA:  Distal femoral pain EXAM: LEFT FEMUR 2 VIEWS COMPARISON:  None FINDINGS: Prior above knee amputation. Osseous mineralization normal. No fracture, dislocation, or bone destruction. Skin clips at stump. Scattered vascular calcifications. IMPRESSION: Post above knee amputation. No acute abnormalities. Electronically Signed   By: Lavonia Dana M.D.   On: 01/15/2018 11:25   Ct Angio Abd/pel W/ And/or W/o  Result Date: 01/16/2018 CLINICAL DATA:  Acute onset of recurrent generalized abdominal pain. Patient on dialysis. Evaluate for bowel ischemia. EXAM: CTA ABDOMEN AND PELVIS wITHOUT AND WITH CONTRAST TECHNIQUE: Multidetector CT imaging of the abdomen and pelvis was performed using the standard protocol during bolus administration of intravenous contrast. Multiplanar reconstructed images and MIPs were obtained and reviewed to evaluate the vascular anatomy. CONTRAST:  65mL ISOVUE-370 IOPAMIDOL (ISOVUE-370) INJECTION 76% COMPARISON:  CT of the abdomen and pelvis performed 01/14/2018 FINDINGS: VASCULAR Aorta: Scattered calcification is noted along the abdominal aorta, with mild associated mural thrombus but no significant luminal narrowing. Celiac: The celiac trunk appears grossly intact, with mild scattered calcification. SMA: The superior mesenteric artery remains intact, with scattered calcification along the superior mesenteric artery. Renals: The renal arteries appear intact bilaterally, though scattered calcification is noted at the proximal renal arteries bilaterally. IMA: There is diffuse narrowing along the proximal inferior mesenteric artery, though it appears patent more distally. Inflow: Scattered calcification is noted along the common, internal and external iliac arteries, with mild luminal narrowing along the internal iliac arteries.  Proximal Outflow: The common femoral arteries demonstrate scattered calcification, but appear patent. Veins: Visualized venous structures are unremarkable in appearance. Review of the MIP images confirms the above findings. NON-VASCULAR Lower chest: Small bilateral pleural effusions are noted. Bibasilar airspace opacification is noted, particularly at the right lung base, concerning for multifocal pneumonia. There is effacement of the right mainstem bronchus, thought to reflect underlying aspiration. Hepatobiliary: The liver is grossly unremarkable in appearance. Trace ascites noted tracking about the liver. Minimal soft tissue inflammation about the gallbladder is nonspecific in the presence of ascites. Cholelithiasis is noted. The common bile duct remains normal in caliber. Pancreas: The pancreas is within normal limits. Spleen: The spleen is unremarkable in appearance. Trace fluid is noted tracking about the spleen. Adrenals/Urinary Tract: The adrenal glands are unremarkable in appearance. Mild to moderate bilateral renal atrophy is noted. Nonspecific perinephric stranding is noted bilaterally. There is no evidence of hydronephrosis. No renal or ureteral stones are identified. Stomach/Bowel: Mild scattered diverticulosis is noted along much of the colon, with sparing of the sigmoid colon. The small bowel is decompressed and grossly unremarkable. The stomach is within normal limits. Lymphatic: No retroperitoneal or pelvic sidewall lymphadenopathy is seen. Reproductive: The bladder is mildly distended and grossly unremarkable. The prostate remains normal in size, with scattered calcification. Other: Diffuse soft tissue edema along the abdominal wall may reflect anasarca. Musculoskeletal: No acute osseous abnormalities are identified. The visualized musculature is unremarkable in appearance. IMPRESSION: VASCULAR 1. No evidence for bowel ischemia. Diffuse narrowing along the proximal inferior mesenteric artery,  though it appears patent distally.  The superior mesenteric artery remains patent. 2. Scattered calcification along the abdominal aorta and its branches, with mild mural thrombus but no significant luminal narrowing at the abdominal aorta. NON-VASCULAR 1. Small bilateral pleural effusions. Bibasilar airspace opacification, right greater than left, concerning for multifocal pneumonia. Effacement of the right mainstem bronchus is suspicious for aspiration. 2. Trace ascites noted tracking about the liver. Minimal soft tissue inflammation about the gallbladder is nonspecific in the presence of ascites. Cholelithiasis noted. 3. Mild to moderate bilateral renal atrophy. 4. Mild scattered diverticulosis along much of the colon, with sparing of the sigmoid colon. 5. Diffuse soft tissue edema along the abdominal wall may reflect anasarca. Aortic Atherosclerosis (ICD10-I70.0). Electronically Signed   By: Garald Balding M.D.   On: 01/16/2018 01:47   US Thoracentesis Asp Pleural Space W/img Guide  Result Date: 01/15/2018 INDICATION: Patient with history of heart failure, ESRD, volume overload, dyspnea, and right pleural effusion. Request is made for diagnostic and therapeutic right thoracentesis. EXAM: ULTRASOUND GUIDED DIAGNOSTIC AND THERAPEUTIC RIGHT THORACENTESIS MEDICATIONS: 10 mL of 1% lidocaine COMPLICATIONS: None immediate. PROCEDURE: An ultrasound guided thoracentesis was thoroughly discussed with the patient and questions answered. The benefits, risks, alternatives and complications were also discussed. The patient understands and wishes to proceed with the procedure. Written consent was obtained. Ultrasound was performed to localize and mark an adequate pocket of fluid in the right chest. The area was then prepped and draped in the normal sterile fashion. 1% Lidocaine was used for local anesthesia. Under ultrasound guidance a 6 Fr Safe-T-Centesis catheter was introduced. Thoracentesis was performed. The catheter  was removed and a dressing applied. FINDINGS: A total of approximately 2 L of clear yellow fluid was removed. Samples were sent to the laboratory as requested by the clinical team. IMPRESSION: Successful ultrasound guided right thoracentesis yielding 2 L of pleural fluid. Read by: Earley Abide, PA-C Electronically Signed   By: Aletta Edouard M.D.   On: 01/15/2018 11:36     Medications:   . sodium chloride     . acetaminophen  1,000 mg Oral TID  . aspirin EC  81 mg Oral Daily  . atorvastatin  20 mg Oral q1800  . busPIRone  2.5 mg Oral BID  . carvedilol  3.125 mg Oral BID WC  . Chlorhexidine Gluconate Cloth  6 each Topical Q0600  . cinacalcet  30 mg Oral Daily  . clopidogrel  75 mg Oral Daily  . gabapentin  300 mg Oral QHS  . heparin  5,000 Units Subcutaneous Q8H  . insulin aspart  0-5 Units Subcutaneous QHS  . insulin aspart  0-9 Units Subcutaneous TID WC  . insulin detemir  5 Units Subcutaneous QHS  . iopamidol      . isosorbide-hydrALAZINE  1 tablet Oral BID  . midodrine      . midodrine      . [START ON 01/23/2018] midodrine  10 mg Oral Q T,Th,Sa-HD  . pantoprazole  40 mg Oral Daily  . primidone  100 mg Oral Daily  . sevelamer carbonate  1,600 mg Oral TID WC  . sodium chloride flush  3 mL Intravenous Q12H   sodium chloride, ALPRAZolam, diazepam, Melatonin, ondansetron (ZOFRAN) IV, oxyCODONE, sodium chloride flush  Assessment/ Plan:   ESRD Tuesday Thursday Saturday dialysis via unit [facility] started April 2018 on holiday schedule planning dialysis 01/16/2018.  Element of volume overload noted  Anemia we will dose with darbepoetin had no blood transfusions we will check iron studies  Left adrenal adenoma stable  on CT scan  Systolic heart failure ejection fraction 25 to 30% mild aortic stenosis  Coronary artery disease last cardiac catheterization 9/19 PCA with DES distal RCA  Diabetes mellitus per primary care team  Hypertension/volume will challenge if possible  some chronic low blood pressures using midodrine  Peripheral vascular disease with bilateral below-knee amputation.  Secondary hyperparathyroidism continue cinacalcet 30 mg and binders sevelamer 800 mg 2 with meals  Consider goals of care while inpatient will defer to primary team   LOS: 0 Sherril Croon @TODAY @10 :54 AM

## 2018-01-16 NOTE — Progress Notes (Signed)
PROGRESS NOTE        PATIENT DETAILS Name: Mitchell Garcia Age: 72 y.o. Sex: male Date of Birth: 1945-07-25 Admit Date: 01/14/2018 Admitting Physician Vianne Bulls, MD ONG:EXBM, Harrell Gave, MD  Brief Narrative: Patient is a 72 y.o. male with history of ESRD on HD, chronic systolic heart failure, insulin dependent DM, bilateral AKA-presented with worsening shortness of breath and volume overload, found to have large right-sided pleural effusion-underwent thoracocentesis.  See below for further details.  Subjective: Seen in HD unit earlier this morning-not in any distress-claims breathing is better.  Wants to go back to his skilled nursing facility for [place] is requesting discharge.  Assessment/Plan: Large right-sided pleural effusion: Suspect transudate in the setting of chronic systolic heart failure/ESRD.  Underwent thoracocentesis on 12/22.  Acute on chronic systolic heart failure: Volume status improving with HD-defer further volume removal to nephrology.  ESRD: Hemodialysis being directed by the nephrology service.  History of bilateral AKA-recent left AKA due to non-healing left foot wounds at [facility]  CAD: Denies any chest pain-continue dual antiplatelet agents, statin and beta-blocker  Atrial fibrillation: Rate controlled-continue metoprolol-given recent history of GI bleeding-and overall frailty-not a good long-term candidate for anticoagulation.  Chronic pain syndrome: Has chronic pain in his back and knees-continue Neurontin (unable to increase dosage due to ESRD) and as needed narcotics.    Hypoxic respiratory failure: Continue home O2  Chronic hypotension: Continue midodrine  Failure to thrive syndrome/debility/deconditioning/frailty: Very poor overall prognosis-he appears to be in very poor overall shape.  Will reach out to family-we will also get palliative care to delineate goals of care.  DVT  Prophylaxis: Prophylactic Heparin  Code Status: Full code   Family Communication: None at bedside  Disposition Plan: Remain inpatient- SNF on discharge  Antimicrobial agents: Anti-infectives (From admission, onward)   None      Procedures: 12/22>> thoracocentesis  CONSULTS:  nephrology  Time spent: 25- minutes-Greater than 50% of this time was spent in counseling, explanation of diagnosis, planning of further management, and coordination of care.  MEDICATIONS: Scheduled Meds: . acetaminophen  1,000 mg Oral TID  . aspirin EC  81 mg Oral Daily  . atorvastatin  20 mg Oral q1800  . busPIRone  2.5 mg Oral BID  . carvedilol  3.125 mg Oral BID WC  . Chlorhexidine Gluconate Cloth  6 each Topical Q0600  . cinacalcet  30 mg Oral Daily  . clopidogrel  75 mg Oral Daily  . darbepoetin (ARANESP) injection - DIALYSIS  200 mcg Intravenous Q Mon-HD  . gabapentin  300 mg Oral QHS  . heparin  5,000 Units Subcutaneous Q8H  . insulin aspart  0-5 Units Subcutaneous QHS  . insulin aspart  0-9 Units Subcutaneous TID WC  . insulin detemir  5 Units Subcutaneous QHS  . isosorbide-hydrALAZINE  1 tablet Oral BID  . [START ON 01/07/2018] midodrine  10 mg Oral Q T,Th,Sa-HD  . pantoprazole  40 mg Oral Daily  . primidone  100 mg Oral Daily  . sevelamer carbonate  1,600 mg Oral TID WC  . sodium chloride flush  3 mL Intravenous Q12H   Continuous Infusions: . sodium chloride     PRN Meds:.sodium chloride, ALPRAZolam, diazepam, Melatonin, ondansetron (ZOFRAN) IV, oxyCODONE, sodium chloride flush   PHYSICAL EXAM: Vital signs: Vitals:   01/16/18 1130 01/16/18 1200 01/16/18 1203 01/16/18 1258  BP: (!) 135/53 (!) 149/71 (!) 149/71 (!) 146/80  Pulse: 68 69 69 75  Resp:   18 18  Temp:   97.6 F (36.4 C) 97.6 F (36.4 C)  TempSrc:   Oral Oral  SpO2:   100% 96%  Weight:   76.6 kg    Filed Weights   01/16/18 0500 01/16/18 0735 01/16/18 1203  Weight: 77.4 kg 80.4 kg 76.6 kg   Body mass  index is 24.23 kg/m.   General appearance :Awake, alert, not in any distress.  Frail-chronically sick appearing Eyes:.Pink conjunctiva HEENT: Atraumatic and Normocephalic Neck: supple Resp:Good air entry bilaterally CVS: S1 S2 regular  GI: Bowel sounds present, Non tender and not distended with no gaurding, rigidity or rebound.No organomegaly Extremities: B/L AKA Musculoskeletal:No digital cyanosis Skin:No Rash, warm and dry Wounds:N/A  I have personally reviewed following labs and imaging studies  LABORATORY DATA: CBC: Recent Labs  Lab 01/14/18 1928 01/15/18 0752 01/16/18 0811 01/16/18 0944  WBC 14.7* 9.6 QUESTIONABLE IDENTIFICATION / INCORRECTLY LABELED SPECIMEN 13.8*  NEUTROABS 12.5*  --   --   --   HGB 7.6* 7.4* QUESTIONABLE IDENTIFICATION / INCORRECTLY LABELED SPECIMEN 8.0*  HCT 27.0* 25.5* QUESTIONABLE IDENTIFICATION / INCORRECTLY LABELED SPECIMEN 27.4*  MCV 99.6 99.2 QUESTIONABLE IDENTIFICATION / INCORRECTLY LABELED SPECIMEN 100.4*  PLT 389 389 QUESTIONABLE IDENTIFICATION / INCORRECTLY LABELED SPECIMEN 284    Basic Metabolic Panel: Recent Labs  Lab 01/14/18 1928 01/15/18 0750 01/16/18 0525  NA 135 135 132*  K 3.9 4.0 4.2  CL 95* 94* 93*  CO2 25 28 29   GLUCOSE 142* 249* 287*  BUN 19 28* 37*  CREATININE 1.91* 2.45* 3.22*  CALCIUM 7.6* 7.7* 7.5*  PHOS  --  3.4  --     GFR: Estimated Creatinine Clearance: 20.8 mL/min (A) (by C-G formula based on SCr of 3.22 mg/dL (H)).  Liver Function Tests: Recent Labs  Lab 01/14/18 1928 01/15/18 0750  AST 25 24  ALT 22 23  ALKPHOS 94 93  BILITOT 0.7 0.4  PROT 5.6* 5.4*  ALBUMIN 2.3* 2.2*   Recent Labs  Lab 01/14/18 1928  LIPASE 17   No results for input(s): AMMONIA in the last 168 hours.  Coagulation Profile: Recent Labs  Lab 01/14/18 1928  INR 1.10    Cardiac Enzymes: No results for input(s): CKTOTAL, CKMB, CKMBINDEX, TROPONINI in the last 168 hours.  BNP (last 3 results) No results for  input(s): PROBNP in the last 8760 hours.  HbA1C: No results for input(s): HGBA1C in the last 72 hours.  CBG: Recent Labs  Lab 01/15/18 1623 01/15/18 2150 01/16/18 0817 01/16/18 1120 01/16/18 1255  GLUCAP 133* 161* 229* 176* 168*    Lipid Profile: No results for input(s): CHOL, HDL, LDLCALC, TRIG, CHOLHDL, LDLDIRECT in the last 72 hours.  Thyroid Function Tests: No results for input(s): TSH, T4TOTAL, FREET4, T3FREE, THYROIDAB in the last 72 hours.  Anemia Panel: Recent Labs    01/16/18 1335  TIBC 210*  IRON 36*    Urine analysis:    Component Value Date/Time   COLORURINE YELLOW 07/26/2017 0846   APPEARANCEUR CLOUDY (A) 07/26/2017 0846   LABSPEC 1.017 07/26/2017 0846   PHURINE 5.0 07/26/2017 0846   GLUCOSEU NEGATIVE 07/26/2017 0846   HGBUR SMALL (A) 07/26/2017 0846   BILIRUBINUR NEGATIVE 07/26/2017 0846   KETONESUR NEGATIVE 07/26/2017 0846   PROTEINUR >=300 (A) 07/26/2017 0846   UROBILINOGEN 1.0 01/25/2013 0208   NITRITE NEGATIVE 07/26/2017 0846   LEUKOCYTESUR LARGE (A) 07/26/2017 1324  Sepsis Labs: Lactic Acid, Venous    Component Value Date/Time   LATICACIDVEN 1.18 07/25/2017 1112    MICROBIOLOGY: Recent Results (from the past 240 hour(s))  MRSA PCR Screening     Status: None   Collection Time: 01/14/18 11:41 PM  Result Value Ref Range Status   MRSA by PCR NEGATIVE NEGATIVE Final    Comment:        The GeneXpert MRSA Assay (FDA approved for NASAL specimens only), is one component of a comprehensive MRSA colonization surveillance program. It is not intended to diagnose MRSA infection nor to guide or monitor treatment for MRSA infections. Performed at [facility], [place] [address] [place], [place] [address]   Culture, body fluid-bottle     Status: None (Preliminary result)   Collection Time: 01/15/18 11:42 AM  Result Value Ref Range Status   Specimen Description PLEURAL RIGHT  Final   Special Requests NONE  Final   Culture   Final     NO GROWTH < 24 HOURS Performed at [facility], [place] [address] [place], [place] [address]    Report Status PENDING  Incomplete  Gram stain     Status: None   Collection Time: 01/15/18 11:42 AM  Result Value Ref Range Status   Specimen Description PLEURAL RIGHT  Final   Special Requests NONE  Final   Gram Stain   Final    RARE WBC PRESENT, PREDOMINANTLY PMN NO ORGANISMS SEEN Performed at [facility], [place] [address], [place], [place] [address]    Report Status 01/15/2018 FINAL  Final    RADIOLOGY STUDIES/RESULTS: Ct Abdomen Pelvis Wo Contrast  Result Date: 01/14/2018 CLINICAL DATA:  Pain all over. EXAM: CT ABDOMEN AND PELVIS WITHOUT CONTRAST TECHNIQUE: Multidetector CT imaging of the abdomen and pelvis was performed following the standard protocol without IV contrast. COMPARISON:  12/05/2017 FINDINGS: Lower chest: Heart is enlarged. Coronary artery calcification is evident. Central line tip identified distal SVC. Large right pleural effusion is associated with right lower lobe collapse/consolidation. There is a small left pleural effusion. Hepatobiliary: No focal abnormality in the liver on this study without intravenous contrast. Layering tiny gallstones evident. No intrahepatic or extrahepatic biliary dilation. Pancreas: Pancreas is diffusely atrophic with parenchymal calcification in the region the pancreatic head. Imaging features are stable and likely reflect chronic pancreatitis. Spleen: No focal mass lesion. No dilatation of the main duct. No intraparenchymal cyst. No peripancreatic edema. Adrenals/Urinary Tract: Right adrenal gland unremarkable. Stable 13 mm left adrenal nodule. Low attenuation of this nodule suggests adrenal adenoma. Tiny stones noted in the left kidney. Right kidney unremarkable. No evidence for hydroureter. The urinary bladder appears normal for the degree of distention. Stomach/Bowel: Stomach is nondistended. No gastric wall thickening. No evidence of  outlet obstruction. Duodenum is normally positioned as is the ligament of Treitz. No small bowel wall thickening. No small bowel dilatation. The terminal ileum is normal. The appendix is normal. No gross colonic mass. No colonic wall thickening. Vascular/Lymphatic: There is abdominal aortic atherosclerosis without aneurysm. There is no gastrohepatic or hepatoduodenal ligament lymphadenopathy. No intraperitoneal or retroperitoneal lymphadenopathy. No pelvic sidewall lymphadenopathy. Reproductive: The prostate gland and seminal vesicles have normal imaging features. Other: Small volume intraperitoneal free fluid evident. There is diffuse body wall edema associated with mesenteric and presacral edema. Musculoskeletal: Small left groin hernia contains only fat. No worrisome lytic or sclerotic osseous abnormality. IMPRESSION: 1. No acute findings in the abdomen or pelvis. 2. Large right pleural effusion with  right lower lobe collapse/consolidation. 3. Small left pleural effusion. 4. Small volume intraperitoneal free fluid with diffuse body wall edema. 5. Cholelithiasis. 6. Stable left adrenal adenoma. Electronically Signed   By: Misty Stanley M.D.   On: 01/14/2018 20:12   Dg Chest 1 View  Result Date: 01/15/2018 CLINICAL DATA:  Post thoracentesis EXAM: CHEST  1 VIEW COMPARISON:  01/14/2018 FINDINGS: RIGHT jugular dual-lumen central venous catheter with tip projecting over SVC. Enlargement of cardiac silhouette with pulmonary vascular congestion. Decreased RIGHT pleural effusion and basilar atelectasis since previous exam. No pneumothorax post thoracentesis. Bones demineralized with old posttraumatic deformity of the proximal RIGHT humerus. Atherosclerotic calcification aortic arch. IMPRESSION: Decreased RIGHT pleural effusion and basilar atelectasis post thoracentesis without pneumothorax. Enlargement of cardiac silhouette with pulmonary vascular congestion. Electronically Signed   By: Lavonia Dana M.D.   On:  01/15/2018 11:25   Dg Chest 1 View  Result Date: 01/14/2018 CLINICAL DATA:  Shortness of breath, abdominal pain EXAM: CHEST  1 VIEW COMPARISON:  Portable exam 1856 hours compared to 12/11/2017 FINDINGS: RIGHT jugular line with tip projecting over SVC near cavoatrial junction. Enlargement of cardiac silhouette with pulmonary vascular congestion. Moderate to large RIGHT pleural effusion with basilar atelectasis. Underlying pulmonary edema. Atherosclerotic calcification aorta. No pneumothorax. Bones demineralized. IMPRESSION: Enlargement of cardiac silhouette with pulmonary vascular congestion and mild pulmonary edema. Moderate to large RIGHT pleural effusion and basilar atelectasis increased since prior study. Electronically Signed   By: Lavonia Dana M.D.   On: 01/14/2018 19:11   Dg Femur Min 2 Views Left  Result Date: 01/15/2018 CLINICAL DATA:  Distal femoral pain EXAM: LEFT FEMUR 2 VIEWS COMPARISON:  None FINDINGS: Prior above knee amputation. Osseous mineralization normal. No fracture, dislocation, or bone destruction. Skin clips at stump. Scattered vascular calcifications. IMPRESSION: Post above knee amputation. No acute abnormalities. Electronically Signed   By: Lavonia Dana M.D.   On: 01/15/2018 11:25   Ct Angio Abd/pel W/ And/or W/o  Result Date: 01/16/2018 CLINICAL DATA:  Acute onset of recurrent generalized abdominal pain. Patient on dialysis. Evaluate for bowel ischemia. EXAM: CTA ABDOMEN AND PELVIS wITHOUT AND WITH CONTRAST TECHNIQUE: Multidetector CT imaging of the abdomen and pelvis was performed using the standard protocol during bolus administration of intravenous contrast. Multiplanar reconstructed images and MIPs were obtained and reviewed to evaluate the vascular anatomy. CONTRAST:  93mL ISOVUE-370 IOPAMIDOL (ISOVUE-370) INJECTION 76% COMPARISON:  CT of the abdomen and pelvis performed 01/14/2018 FINDINGS: VASCULAR Aorta: Scattered calcification is noted along the abdominal aorta, with  mild associated mural thrombus but no significant luminal narrowing. Celiac: The celiac trunk appears grossly intact, with mild scattered calcification. SMA: The superior mesenteric artery remains intact, with scattered calcification along the superior mesenteric artery. Renals: The renal arteries appear intact bilaterally, though scattered calcification is noted at the proximal renal arteries bilaterally. IMA: There is diffuse narrowing along the proximal inferior mesenteric artery, though it appears patent more distally. Inflow: Scattered calcification is noted along the common, internal and external iliac arteries, with mild luminal narrowing along the internal iliac arteries. Proximal Outflow: The common femoral arteries demonstrate scattered calcification, but appear patent. Veins: Visualized venous structures are unremarkable in appearance. Review of the MIP images confirms the above findings. NON-VASCULAR Lower chest: Small bilateral pleural effusions are noted. Bibasilar airspace opacification is noted, particularly at the right lung base, concerning for multifocal pneumonia. There is effacement of the right mainstem bronchus, thought to reflect underlying aspiration. Hepatobiliary: The liver is grossly unremarkable in appearance. Trace ascites  noted tracking about the liver. Minimal soft tissue inflammation about the gallbladder is nonspecific in the presence of ascites. Cholelithiasis is noted. The common bile duct remains normal in caliber. Pancreas: The pancreas is within normal limits. Spleen: The spleen is unremarkable in appearance. Trace fluid is noted tracking about the spleen. Adrenals/Urinary Tract: The adrenal glands are unremarkable in appearance. Mild to moderate bilateral renal atrophy is noted. Nonspecific perinephric stranding is noted bilaterally. There is no evidence of hydronephrosis. No renal or ureteral stones are identified. Stomach/Bowel: Mild scattered diverticulosis is noted along  much of the colon, with sparing of the sigmoid colon. The small bowel is decompressed and grossly unremarkable. The stomach is within normal limits. Lymphatic: No retroperitoneal or pelvic sidewall lymphadenopathy is seen. Reproductive: The bladder is mildly distended and grossly unremarkable. The prostate remains normal in size, with scattered calcification. Other: Diffuse soft tissue edema along the abdominal wall may reflect anasarca. Musculoskeletal: No acute osseous abnormalities are identified. The visualized musculature is unremarkable in appearance. IMPRESSION: VASCULAR 1. No evidence for bowel ischemia. Diffuse narrowing along the proximal inferior mesenteric artery, though it appears patent distally. The superior mesenteric artery remains patent. 2. Scattered calcification along the abdominal aorta and its branches, with mild mural thrombus but no significant luminal narrowing at the abdominal aorta. NON-VASCULAR 1. Small bilateral pleural effusions. Bibasilar airspace opacification, right greater than left, concerning for multifocal pneumonia. Effacement of the right mainstem bronchus is suspicious for aspiration. 2. Trace ascites noted tracking about the liver. Minimal soft tissue inflammation about the gallbladder is nonspecific in the presence of ascites. Cholelithiasis noted. 3. Mild to moderate bilateral renal atrophy. 4. Mild scattered diverticulosis along much of the colon, with sparing of the sigmoid colon. 5. Diffuse soft tissue edema along the abdominal wall may reflect anasarca. Aortic Atherosclerosis (ICD10-I70.0). Electronically Signed   By: Garald Balding M.D.   On: 01/16/2018 01:47   US Thoracentesis Asp Pleural Space W/img Guide  Result Date: 01/15/2018 INDICATION: Patient with history of heart failure, ESRD, volume overload, dyspnea, and right pleural effusion. Request is made for diagnostic and therapeutic right thoracentesis. EXAM: ULTRASOUND GUIDED DIAGNOSTIC AND THERAPEUTIC RIGHT  THORACENTESIS MEDICATIONS: 10 mL of 1% lidocaine COMPLICATIONS: None immediate. PROCEDURE: An ultrasound guided thoracentesis was thoroughly discussed with the patient and questions answered. The benefits, risks, alternatives and complications were also discussed. The patient understands and wishes to proceed with the procedure. Written consent was obtained. Ultrasound was performed to localize and mark an adequate pocket of fluid in the right chest. The area was then prepped and draped in the normal sterile fashion. 1% Lidocaine was used for local anesthesia. Under ultrasound guidance a 6 Fr Safe-T-Centesis catheter was introduced. Thoracentesis was performed. The catheter was removed and a dressing applied. FINDINGS: A total of approximately 2 L of clear yellow fluid was removed. Samples were sent to the laboratory as requested by the clinical team. IMPRESSION: Successful ultrasound guided right thoracentesis yielding 2 L of pleural fluid. Read by: Earley Abide, PA-C Electronically Signed   By: Aletta Edouard M.D.   On: 01/15/2018 11:36     LOS: 0 days   Oren Binet, MD  [facility]  If 7PM-7AM, please contact night-coverage  Please page via [email or web address] [email or web address]-click on MD name and type text message  01/16/2018, 3:33 PM

## 2018-01-16 NOTE — Progress Notes (Signed)
Inpatient Diabetes Program Recommendations  AACE/ADA: New Consensus Statement on Inpatient Glycemic Control (2019)  Target Ranges:  Prepandial:   less than 140 mg/dL      Peak postprandial:   less than 180 mg/dL (1-2 hours)      Critically ill patients:  140 - 180 mg/dL   Results for CAROLINE, MATTERS (MRN [id number]) as of 01/16/2018 10:19  Ref. Range 01/15/2018 07:19 01/15/2018 11:58 01/15/2018 16:23 01/15/2018 21:50 01/16/2018 08:17  Glucose-Capillary Latest Ref Range: 70 - 99 mg/dL 209 (H) 181 (H) 133 (H) 161 (H) 229 (H)    Review of Glycemic Control  Diabetes history: DM2 Outpatient Diabetes medications: Levemir 5 units QHS Current orders for Inpatient glycemic control: Levemir 5 units QHS, Novolog 0-9 units TID with meals, Novolog 0-5 units QHS  Inpatient Diabetes Program Recommendations:  Insulin - Basal: Please consider increasing Levemir to 7 units QHS.  Thanks, Barnie Alderman, RN, MSN, CDE Diabetes Coordinator Inpatient Diabetes Program [phone] (Team Pager from 8am to 5pm)

## 2018-01-16 NOTE — Progress Notes (Signed)
Discussed with grand - daughter Estill Bamberg.  Non adherent to fluid restriction  Has ischemic cardiomyopathy with EF 25% and some mild aortic stenosis  Volume overload requiring thoracentesis  CT abdomen no ischemic bowel but some ascites  BP low Will try dialysis in AM and patient can be discharged after treatment from renal standpoint   Dialysis center  [facility]

## 2018-01-16 NOTE — Consult Note (Signed)
[facility] Nurse wound consult note Reason for Consult: Surgical wound to left stump.  Intact staple line Right stump healed with pink new epithelial tissue Wound type: surgical  Pressure Injury POA: NA Measurement: Left stump:  Staple line:  0.2 cm x 12 cm with 0.2 cm dehiscence noted Wound bed: ruddy red Drainage (amount, consistency, odor) minimal serosanguinous  No odor Periwound:intact Dressing procedure/placement/frequency: Cleanse left stump staple line with NS.  Apply Xeroform gauze Cover with gauze and kerlix/tape. Change daily  Foam dressing to right stump.  Change every three days and PRN soilage.  Will not follow at this time.  Please re-consult if needed.  Domenic Moras MSN, RN, FNP-BC CWON Wound, Ostomy, Continence Nurse Pager [phone]

## 2018-01-16 NOTE — Evaluation (Addendum)
Physical Therapy Evaluation Patient Details Name: Mitchell Garcia MRN: [id number] DOB: 08-14-1945 Today's Date: 01/16/2018   History of Present Illness  Pt is a 72 y.o. male admitted 01/14/18 with increased SOB, abdominal pain and knee pain (s/p recent L AKA). CXR with pulmonary edema, vascular congestion and pleural effusion. PMH includes bilateral AKA, ESRD on HD, CAD, CHF, chronic pain.    Clinical Impression  Pt presents with an overall decrease in functional mobility secondary to above. PTA, pt reports receiving rehab at [facility], requiring assist for mobility. Today, pt required maxA to sit EOB, able to maintain seated balance with min guard to modA, easily fatigued by this with poor balance strategies. Pt with slowed processing and perseverating, repeated stating, "I need to return to [place]" despite attempts to redirect to current situation and timeline. Pt would benefit from continued acute PT services to maximize functional mobility and independence prior to d/c with continued SNF-level therapies.     Follow Up Recommendations SNF;Supervision for mobility/OOB    Equipment Recommendations  (TBD next venue)    Recommendations for Other Services       Precautions / Restrictions Precautions Precautions: Fall;Other (comment) Precaution Comments: Bilateral AKA      Mobility  Bed Mobility Overal bed mobility: Needs Assistance Bed Mobility: Supine to Sit;Sit to Supine     Supine to sit: Max assist Sit to supine: Max assist   General bed mobility comments: MaxA to come to long sitting and assist hips to EOB. Pt reliant on UE support to maintain seated balance. MaxA to return to supine and reposition in bed  Transfers                 General transfer comment: NT  Ambulation/Gait                Stairs            Wheelchair Mobility    Modified Rankin (Stroke Patients Only)       Balance Overall balance assessment: Needs  assistance Sitting-balance support: Single extremity supported Sitting balance-Leahy Scale: Fair Sitting balance - Comments: Can briefly hold seated balance, reliant on UE support; intermittent modA, easily fatigued     Standing balance-Leahy Scale: Zero                               Pertinent Vitals/Pain Pain Assessment: Faces Faces Pain Scale: Hurts little more Pain Location: L residual limb incision Pain Descriptors / Indicators: Guarding Pain Intervention(s): Limited activity within patient's tolerance;Repositioned    Home Living Family/patient expects to be discharged to:: Skilled nursing facility                 Additional Comments: Pt reports has been at [facility] for 3 months    Prior Function Level of Independence: Needs assistance   Gait / Transfers Assistance Needed: Pt reports recently started working with PT at [facility], requiring assist to sit up and get to w/c. Pt poor historian           Hand Dominance        Extremity/Trunk Assessment   Upper Extremity Assessment Upper Extremity Assessment: Generalized weakness    Lower Extremity Assessment Lower Extremity Assessment: Generalized weakness(Bilateral AKA; hip flex grossly 3/5)       Communication   Communication: HOH  Cognition Arousal/Alertness: Awake/alert Behavior During Therapy: WFL for tasks assessed/performed Overall Cognitive Status: No family/caregiver present to determine baseline cognitive  functioning Area of Impairment: Orientation;Attention;Memory;Following commands;Safety/judgement;Awareness;Problem solving                 Orientation Level: Disoriented to;Situation;Place Current Attention Level: Sustained Memory: Decreased short-term memory Following Commands: Follows one step commands with increased time Safety/Judgement: Decreased awareness of deficits Awareness: Emergent Problem Solving: Difficulty sequencing;Decreased initiation;Requires verbal  cues;Slow processing General Comments: Pt able to state he is on [employer] "at the hospital [facility]". Perseverating on "I need to get back to [facility]..." aware       General Comments      Exercises     Assessment/Plan    PT Assessment Patient needs continued PT services  PT Problem List Decreased strength;Decreased range of motion;Decreased balance;Decreased activity tolerance;Decreased mobility;Decreased safety awareness;Pain       PT Treatment Interventions DME instruction;Functional mobility training;Therapeutic activities;Therapeutic exercise;Balance training;Patient/family education;Cognitive remediation;Wheelchair mobility training    PT Goals (Current goals can be found in the Care Plan section)  Acute Rehab PT Goals Patient Stated Goal: "I need to get back to [facility]" PT Goal Formulation: With patient Time For Goal Achievement: 01/30/18 Potential to Achieve Goals: Good    Frequency Min 2X/week   Barriers to discharge        Co-evaluation               AM-PAC PT "6 Clicks" Mobility  Outcome Measure Help needed turning from your back to your side while in a flat bed without using bedrails?: A Lot Help needed moving from lying on your back to sitting on the side of a flat bed without using bedrails?: A Lot Help needed moving to and from a bed to a chair (including a wheelchair)?: A Lot Help needed standing up from a chair using your arms (e.g., wheelchair or bedside chair)?: Total Help needed to walk in hospital room?: Total Help needed climbing 3-5 steps with a railing? : Total 6 Click Score: 9    End of Session   Activity Tolerance: Patient tolerated treatment well;Patient limited by fatigue Patient left: in bed;with call bell/phone within reach;with bed alarm set Nurse Communication: Mobility status PT Visit Diagnosis: Other abnormalities of gait and mobility (R26.89);Muscle weakness (generalized) (M62.81)    Time:  3846-6599 PT Time Calculation (min) (ACUTE ONLY): 20 min   Charges:   PT Evaluation $PT Eval Moderate Complexity: 1 Mod         Mabeline Caras, PT, DPT Acute Rehabilitation Services  Pager [phone] Office [phone]  Derry Lory 01/16/2018, 2:04 PM

## 2018-01-16 NOTE — Progress Notes (Signed)
PT Cancellation Note  Patient Details Name: DAREAN ROTE MRN: [id number] DOB: 08-Nov-1945   Cancelled Treatment:    Reason Eval/Treat Not Completed: Patient at procedure or test/unavailable (HD). Will follow-up for PT evaluation as schedule permits.  Mabeline Caras, PT, DPT Acute Rehabilitation Services  Pager [phone] Office [phone]  Derry Lory 01/16/2018, 7:41 AM

## 2018-01-17 ENCOUNTER — Emergency Department (HOSPITAL_COMMUNITY): Payer: Medicare Other

## 2018-01-17 ENCOUNTER — Emergency Department (HOSPITAL_COMMUNITY)
Admission: EM | Admit: 2018-01-17 | Discharge: 2018-01-18 | Disposition: A | Discharge status: Home / Self Care | Payer: Medicare Other | Source: Home / Self Care | Attending: Emergency Medicine | Admitting: Emergency Medicine

## 2018-01-17 DIAGNOSIS — Z7401 Bed confinement status: Secondary | ICD-10-CM | POA: Diagnosis not present

## 2018-01-17 DIAGNOSIS — F4321 Adjustment disorder with depressed mood: Secondary | ICD-10-CM

## 2018-01-17 DIAGNOSIS — R45851 Suicidal ideations: Secondary | ICD-10-CM | POA: Insufficient documentation

## 2018-01-17 DIAGNOSIS — Z79899 Other long term (current) drug therapy: Secondary | ICD-10-CM

## 2018-01-17 DIAGNOSIS — I4510 Unspecified right bundle-branch block: Secondary | ICD-10-CM | POA: Diagnosis not present

## 2018-01-17 DIAGNOSIS — M2550 Pain in unspecified joint: Secondary | ICD-10-CM | POA: Diagnosis not present

## 2018-01-17 DIAGNOSIS — N186 End stage renal disease: Secondary | ICD-10-CM | POA: Diagnosis not present

## 2018-01-17 DIAGNOSIS — G8929 Other chronic pain: Secondary | ICD-10-CM | POA: Diagnosis not present

## 2018-01-17 DIAGNOSIS — D638 Anemia in other chronic diseases classified elsewhere: Secondary | ICD-10-CM | POA: Diagnosis not present

## 2018-01-17 DIAGNOSIS — E8779 Other fluid overload: Secondary | ICD-10-CM | POA: Diagnosis not present

## 2018-01-17 DIAGNOSIS — I2510 Atherosclerotic heart disease of native coronary artery without angina pectoris: Secondary | ICD-10-CM | POA: Insufficient documentation

## 2018-01-17 DIAGNOSIS — F332 Major depressive disorder, recurrent severe without psychotic features: Secondary | ICD-10-CM | POA: Insufficient documentation

## 2018-01-17 DIAGNOSIS — E1122 Type 2 diabetes mellitus with diabetic chronic kidney disease: Secondary | ICD-10-CM | POA: Diagnosis not present

## 2018-01-17 DIAGNOSIS — R0902 Hypoxemia: Secondary | ICD-10-CM | POA: Diagnosis not present

## 2018-01-17 DIAGNOSIS — R5381 Other malaise: Secondary | ICD-10-CM | POA: Diagnosis not present

## 2018-01-17 DIAGNOSIS — I509 Heart failure, unspecified: Secondary | ICD-10-CM

## 2018-01-17 DIAGNOSIS — I5043 Acute on chronic combined systolic (congestive) and diastolic (congestive) heart failure: Secondary | ICD-10-CM | POA: Diagnosis not present

## 2018-01-17 DIAGNOSIS — I120 Hypertensive chronic kidney disease with stage 5 chronic kidney disease or end stage renal disease: Secondary | ICD-10-CM

## 2018-01-17 DIAGNOSIS — E119 Type 2 diabetes mellitus without complications: Secondary | ICD-10-CM | POA: Insufficient documentation

## 2018-01-17 DIAGNOSIS — I132 Hypertensive heart and chronic kidney disease with heart failure and with stage 5 chronic kidney disease, or end stage renal disease: Secondary | ICD-10-CM | POA: Diagnosis not present

## 2018-01-17 DIAGNOSIS — Z992 Dependence on renal dialysis: Secondary | ICD-10-CM | POA: Diagnosis not present

## 2018-01-17 LAB — RENAL FUNCTION PANEL
Albumin: 2.3 g/dL — ABNORMAL LOW (ref 3.5–5.0)
Anion gap: 14 (ref 5–15)
BUN: 18 mg/dL (ref 8–23)
CO2: 26 mmol/L (ref 22–32)
Calcium: 7.5 mg/dL — ABNORMAL LOW (ref 8.9–10.3)
Chloride: 96 mmol/L — ABNORMAL LOW (ref 98–111)
Creatinine, Ser: 2.15 mg/dL — ABNORMAL HIGH (ref 0.61–1.24)
GFR calc Af Amer: 34 mL/min — ABNORMAL LOW (ref >60)
GFR calc non Af Amer: 29 mL/min — ABNORMAL LOW (ref >60)
Glucose, Bld: 107 mg/dL — ABNORMAL HIGH (ref 70–99)
Phosphorus: 3.4 mg/dL (ref 2.5–4.6)
Potassium: 4.1 mmol/L (ref 3.5–5.1)
Sodium: 136 mmol/L (ref 135–145)

## 2018-01-17 LAB — CBC
HCT: 26.3 % — ABNORMAL LOW (ref 39.0–52.0)
HCT: 27.2 % — ABNORMAL LOW (ref 39.0–52.0)
HEMOGLOBIN: 7.4 g/dL — AB (ref 13.0–17.0)
Hemoglobin: 7.7 g/dL — ABNORMAL LOW (ref 13.0–17.0)
MCH: 28.5 pg (ref 26.0–34.0)
MCH: 29.4 pg (ref 26.0–34.0)
MCHC: 28.1 g/dL — ABNORMAL LOW (ref 30.0–36.0)
MCHC: 28.3 g/dL — ABNORMAL LOW (ref 30.0–36.0)
MCV: 101.2 fL — ABNORMAL HIGH (ref 80.0–100.0)
MCV: 103.8 fL — ABNORMAL HIGH (ref 80.0–100.0)
NRBC: 0 % (ref 0.0–0.2)
Platelets: 336 10*3/uL (ref 150–400)
Platelets: 392 10*3/uL (ref 150–400)
RBC: 2.6 MIL/uL — AB (ref 4.22–5.81)
RBC: 2.62 MIL/uL — ABNORMAL LOW (ref 4.22–5.81)
RDW: 19.2 % — ABNORMAL HIGH (ref 11.5–15.5)
RDW: 19.5 % — ABNORMAL HIGH (ref 11.5–15.5)
WBC: 12.1 10*3/uL — ABNORMAL HIGH (ref 4.0–10.5)
WBC: 14.1 10*3/uL — AB (ref 4.0–10.5)
nRBC: 0 % (ref 0.0–0.2)

## 2018-01-17 LAB — GLUCOSE, CAPILLARY
GLUCOSE-CAPILLARY: 105 mg/dL — AB (ref 70–99)
Glucose-Capillary: 173 mg/dL — ABNORMAL HIGH (ref 70–99)

## 2018-01-17 LAB — PATHOLOGIST SMEAR REVIEW

## 2018-01-17 MED ORDER — PENTAFLUOROPROP-TETRAFLUOROETH EX AERO: 1.0000 "application " | INHALATION_SPRAY | CUTANEOUS | Status: DC | PRN

## 2018-01-17 MED ORDER — DIAZEPAM 5 MG PO TABS: 2.5000 mg | ORAL_TABLET | Freq: Three times a day (TID) | ORAL | 0 refills | Status: DC | PRN

## 2018-01-17 MED ORDER — ALPRAZOLAM 0.5 MG PO TABS: 0.5000 mg | ORAL_TABLET | Freq: Every evening | ORAL | 0 refills | Status: AC | PRN

## 2018-01-17 MED ORDER — OXYCODONE HCL 5 MG PO TABS
ORAL_TABLET | ORAL | Status: AC
  Filled 2018-01-17: qty 2

## 2018-01-17 MED ORDER — INSULIN ASPART 100 UNIT/ML ~~LOC~~ SOLN: SUBCUTANEOUS | 11 refills | Status: AC

## 2018-01-17 MED ORDER — DIAZEPAM 5 MG PO TABS: 2.5000 mg | ORAL_TABLET | Freq: Three times a day (TID) | ORAL | 0 refills | Status: AC | PRN

## 2018-01-17 MED ORDER — OXYCODONE HCL 5 MG PO TABS: 5.0000 mg | ORAL_TABLET | ORAL | 0 refills | Status: AC

## 2018-01-17 MED ORDER — LIDOCAINE-PRILOCAINE 2.5-2.5 % EX CREA: 1.0000 "application " | TOPICAL_CREAM | CUTANEOUS | Status: DC | PRN

## 2018-01-17 MED ORDER — SODIUM CHLORIDE 0.9 % IV SOLN: 100.0000 mL | INTRAVENOUS | Status: DC | PRN

## 2018-01-17 MED ORDER — ALTEPLASE 2 MG IJ SOLR: 2.0000 mg | Freq: Once | INTRAMUSCULAR | Status: DC | PRN

## 2018-01-17 MED ORDER — HEPARIN SODIUM (PORCINE) 1000 UNIT/ML DIALYSIS
1000.0000 [IU] | INTRAMUSCULAR | Status: DC | PRN
  Administered 2018-01-17: 1000 [IU] via INTRAVENOUS_CENTRAL
  Filled 2018-01-17: qty 1

## 2018-01-17 MED ORDER — ALPRAZOLAM 0.5 MG PO TABS: 0.5000 mg | ORAL_TABLET | Freq: Every evening | ORAL | 0 refills | Status: DC | PRN

## 2018-01-17 MED ORDER — DIAZEPAM 5 MG PO TABS
ORAL_TABLET | ORAL | Status: AC
  Filled 2018-01-17: qty 1

## 2018-01-17 MED ORDER — MIDODRINE HCL 10 MG PO TABS: ORAL_TABLET | ORAL | Status: AC

## 2018-01-17 MED ORDER — LIDOCAINE HCL (PF) 1 % IJ SOLN: 5.0000 mL | INTRAMUSCULAR | Status: DC | PRN

## 2018-01-17 MED ORDER — HEPARIN SODIUM (PORCINE) 1000 UNIT/ML IJ SOLN
INTRAMUSCULAR | Status: AC
  Administered 2018-01-17: 1000 [IU] via INTRAVENOUS_CENTRAL
  Filled 2018-01-17: qty 4

## 2018-01-17 NOTE — ED Triage Notes (Signed)
-  From [place], arrived via EMS -C/C SI, refusing to take any meds -Bilateral amputee, anemia, DM 1, anxiety  -A&O x3  -Vitals  -BP 122/68 -HR 79 -CBG 187 -94 % 3 L [place] -RR 18

## 2018-01-17 NOTE — Progress Notes (Signed)
Patient got very agitated an hour before his end time, he was yelling and crying about not wanting to continue dialysis any more, he also wanted to go to the wars, and go to jail, he wanted the cops called, he was unable to be redirected even after his pain medication and  anxiety medications were administered.

## 2018-01-17 NOTE — NC FL2 (Signed)
[place] LEVEL OF CARE SCREENING TOOL     IDENTIFICATION  Patient Name: Mitchell Garcia Birthdate: [birth year] Sex: male Admission Date (Current Location): 01/14/2018  [facility] and [place] Number:  Herbalist and Address:  [facility] [place], [place] [address], [place], [place] [address]      Provider Number: [id number]  Attending Physician Name and Address:  Jonetta Osgood, MD  Relative Name and Phone Number:       Current Level of Care: Hospital Recommended Level of Care: [place] Prior Approval Number:    Date Approved/Denied:   PASRR Number: [id number]  Discharge Plan: SNF    Current Diagnoses: Patient Active Problem List   Diagnosis Date Noted  . Volume overload 01/14/2018  . Chronic pain 01/14/2018  . Chronic respiratory failure with hypoxia ([place]) 01/14/2018  . Unspecified atrial fibrillation ([place]) 01/14/2018  . Chronic systolic CHF (congestive heart failure) ([place]) 12/10/2017  . Poor venous access   . GI bleed 12/05/2017  . Large pleural effusion 12/05/2017  . Chronic pancreatitis ([place]) 12/05/2017  . Left adrenal mass ([place]) 12/05/2017  . Dermatitis   . Skin lesions   . Pressure injury of skin 07/07/2017  . ESRD on dialysis ([place]) 05/24/2017  . Acute on chronic combined systolic and diastolic CHF (congestive heart failure) ([place]) 05/17/2017  . Ischemic cardiomyopathy 10/15/2016  . CAD S/P percutaneous coronary angioplasty 10/15/2016  . DM (diabetes mellitus), type 2 with renal complications ([place]) 08/65/7846  . Essential hypertension 10/15/2016  . Acute combined systolic (congestive) and diastolic (congestive) heart failure ([place])   . Humerus head fracture 01/27/2013  . Anemia of chronic disease 01/25/2013    Orientation RESPIRATION BLADDER Height & Weight     Self, Time, Situation, Place  O2(Nasal Canula 3 L) Continent Weight: 172 lb 2.9 oz (78.1 kg) Height:  5\' 10"  (177.8 cm)  BEHAVIORAL  SYMPTOMS/MOOD NEUROLOGICAL BOWEL NUTRITION STATUS  (None) (None) Continent Diet(Renal/carb modified)  AMBULATORY STATUS COMMUNICATION OF NEEDS Skin   Extensive Assist Verbally Other (Comment), Surgical wounds, Bruising(Amputation, Cellulitis, MASD. Unstageable non-pressure wound on right stump: Adhesive bandage. 4 small wounds on mid sacrum: Foam. Non-pressure wound on left posterior leg: No dressing.)                       Personal Care Assistance Level of Assistance              Functional Limitations Info  Sight, Hearing, Speech Sight Info: Adequate Hearing Info: Adequate Speech Info: Adequate    SPECIAL CARE FACTORS FREQUENCY  PT (By licensed PT)     PT Frequency: 5 x week              Contractures Contractures Info: Not present    Additional Factors Info  Code Status, Allergies Code Status Info: Full code Allergies Info: Oxycodone, Tramadol, Lisinopril, Losartan           Current Medications (01/17/2018):  This is the current hospital active medication list Current Facility-Administered Medications  Medication Dose Route Frequency Provider Last Rate Last Dose  . 0.9 %  sodium chloride infusion  250 mL Intravenous PRN Opyd, Ilene Qua, MD      . acetaminophen (TYLENOL) tablet 1,000 mg  1,000 mg Oral TID Eulogio Bear U, DO   1,000 mg at 01/16/18 2205  . ALPRAZolam Duanne Moron) tablet 0.5 mg  0.5 mg Oral QHS PRN Opyd, Ilene Qua, MD   0.5 mg at 01/16/18  2205  . aspirin EC tablet 81 mg  81 mg Oral Daily Opyd, Ilene Qua, MD   81 mg at 01/16/18 1423  . atorvastatin (LIPITOR) tablet 20 mg  20 mg Oral q1800 Opyd, Ilene Qua, MD   20 mg at 01/16/18 1702  . busPIRone (BUSPAR) tablet 2.5 mg  2.5 mg Oral BID Opyd, Ilene Qua, MD   2.5 mg at 01/16/18 2206  . carvedilol (COREG) tablet 3.125 mg  3.125 mg Oral BID WC Opyd, Ilene Qua, MD   3.125 mg at 01/16/18 1702  . Chlorhexidine Gluconate Cloth 2 % PADS 6 each  6 each Topical Q0600 Roney Jaffe, MD   6 each at 01/17/18 0557   . Chlorhexidine Gluconate Cloth 2 % PADS 6 each  6 each Topical Q0600 Edrick Oh, MD   6 each at 01/17/18 0557  . cinacalcet (SENSIPAR) tablet 30 mg  30 mg Oral Daily Opyd, Ilene Qua, MD   30 mg at 01/16/18 1423  . clopidogrel (PLAVIX) tablet 75 mg  75 mg Oral Daily Opyd, Ilene Qua, MD   75 mg at 01/16/18 1422  . Darbepoetin Alfa (ARANESP) injection 200 mcg  200 mcg Intravenous Q Mon-HD Edrick Oh, MD   200 mcg at 01/16/18 1116  . diazepam (VALIUM) tablet 2.5 mg  2.5 mg Oral Q8H PRN Opyd, Ilene Qua, MD   2.5 mg at 01/17/18 1059  . gabapentin (NEURONTIN) capsule 300 mg  300 mg Oral QHS Vann, Jessica U, DO   300 mg at 01/16/18 2205  . heparin injection 5,000 Units  5,000 Units Subcutaneous Q8H Opyd, Timothy S, MD      . insulin aspart (novoLOG) injection 0-5 Units  0-5 Units Subcutaneous QHS Opyd, Timothy S, MD      . insulin aspart (novoLOG) injection 0-9 Units  0-9 Units Subcutaneous TID WC Opyd, Ilene Qua, MD   3 Units at 01/16/18 1709  . insulin detemir (LEVEMIR) injection 5 Units  5 Units Subcutaneous QHS Eulogio Bear U, DO   5 Units at 01/16/18 2206  . isosorbide-hydrALAZINE (BIDIL) 20-37.5 MG per tablet 1 tablet  1 tablet Oral BID Opyd, Ilene Qua, MD   1 tablet at 01/16/18 2205  . Melatonin TABS 3 mg  3 mg Oral QHS PRN Opyd, Ilene Qua, MD   3 mg at 01/16/18 2205  . [START ON 01/14/2018] midodrine (PROAMATINE) tablet 10 mg  10 mg Oral Q T,Th,Sa-HD Roney Jaffe, MD      . ondansetron [facility]) injection 4 mg  4 mg Intravenous Q6H PRN Opyd, Ilene Qua, MD      . oxyCODONE (Oxy IR/ROXICODONE) 5 MG immediate release tablet           . oxyCODONE (Oxy IR/ROXICODONE) immediate release tablet 5-10 mg  5-10 mg Oral Q4H PRN Eulogio Bear U, DO   10 mg at 01/17/18 0941  . pantoprazole (PROTONIX) EC tablet 40 mg  40 mg Oral Daily Opyd, Ilene Qua, MD   40 mg at 01/16/18 1423  . primidone (MYSOLINE) tablet 100 mg  100 mg Oral Daily Opyd, Ilene Qua, MD   100 mg at 01/16/18 1422  . sevelamer carbonate  (RENVELA) tablet 1,600 mg  1,600 mg Oral TID WC Opyd, Ilene Qua, MD      . sodium chloride flush (NS) 0.9 % injection 3 mL  3 mL Intravenous Q12H Opyd, Ilene Qua, MD   10 mL at 01/16/18 2212  . sodium chloride flush (NS) 0.9 % injection 3 mL  3  mL Intravenous PRN Opyd, Ilene Qua, MD         Discharge Medications: Please see discharge summary for a list of discharge medications.  Relevant Imaging Results:  Relevant Lab Results:   Additional Information SS#: [id number]. HD TTS at [facility].  Candie Chroman, LCSW

## 2018-01-17 NOTE — ED Notes (Signed)
Bed: WO03 Expected date:  Expected time:  Means of arrival:  Comments: EMS 72 yo male from SNF stating he is SI and "lost my mind"

## 2018-01-17 NOTE — Discharge Summary (Signed)
PATIENT DETAILS Name: Mitchell Garcia Age: 72 y.o. Sex: male Date of Birth: 09-09-1945 MRN: [id number]. Admitting Physician: Vianne Bulls, MD [facility], Harrell Gave, MD  Admit Date: 01/14/2018 Discharge date: 01/17/2018  Recommendations for Outpatient Follow-up:  1. Follow up with PCP in 1-2 weeks 2. Please obtain BMP/CBC in one week 3. Please consider palliative care follow-up at SNF 4. Please ensure follow-up with hemodialysis clinic 5. Please ensure follow-up with cardiology  Admitted From:  SNF  Disposition: SNF   Home Health: No  Equipment/Devices: None  Discharge Condition: Stable  CODE STATUS: FULL CODE  Diet recommendation:  Heart Healthy / Carb Modified  Brief Summary: See H&P, Labs, Consult and Test reports for all details in brief,Patient is a 72 y.o. male with history of ESRD on HD, chronic systolic heart failure, insulin dependent DM, bilateral AKA-presented with worsening shortness of breath and volume overload, found to have large right-sided pleural effusion-underwent thoracocentesis.  See below for further details.  Brief Hospital Course: Large right-sided pleural effusion: Suspect transudate in the setting of chronic systolic heart failure/ESRD.  Underwent thoracocentesis on 12/22.  Acute on chronic systolic heart failure: Volume status proved with HD-volume being removed with hemodialysis.    ESRD: Resume HD at previous schedule  History of bilateral AKA-recent left AKA due to non-healing left foot wounds at [place] in the left stump appear intact.  Left stump without any obvious signs of infection/discharge/erythema.  CAD: Denies any chest pain-continue dual antiplatelet agents, statin and beta-blocker  Atrial fibrillation: Rate controlled-continue metoprolol-given recent history of GI bleeding-and overall frailty-not a good long-term candidate for anticoagulation.  Chronic pain syndrome: Has chronic  pain in his back and knees-continue Neurontin (unable to increase dosage due to ESRD) and as needed narcotics.    Hypoxic respiratory failure: Continue home O2  Chronic hypotension: Continue midodrine  Failure to thrive syndrome/debility/deconditioning/frailty: Very poor overall prognosis-he appears to be in very poor overall shape.    Attempted to call granddaughter yesterday and today-unsuccessful-suspect best to pursue palliative care follow-up at SNF.  Procedures/Studies: 12/22>> thoracocentesis   Discharge Diagnoses:  Principal Problem:   Volume overload Active Problems:   Anemia of chronic disease   CAD S/P percutaneous coronary angioplasty   DM (diabetes mellitus), type 2 with renal complications (HCC)   Acute on chronic combined systolic and diastolic CHF (congestive heart failure) (HCC)   ESRD on dialysis ([place])   Pressure injury of skin   Large pleural effusion   Chronic pain   Chronic respiratory failure with hypoxia (HCC)   Unspecified atrial fibrillation [facility])   Discharge Instructions:  Activity:  As tolerated with Full fall precautions use walker/cane & assistance as needed   Discharge Instructions    Diet - low sodium heart healthy   Complete by:  As directed    Diet Carb Modified   Complete by:  As directed    Discharge instructions   Complete by:  As directed    Check CBGs before meals and at bedtime  Follow with Primary MD  [name], MD in 1 week, and follow-up with your hemodialysis clinic at your usual schedule.  Please get a complete blood count and chemistry panel checked by your Primary MD at your next visit, and again as instructed by your Primary MD.  Get Medicines reviewed and adjusted: Please take all your medications with you for your next visit with your Primary MD  Laboratory/radiological data: Please request your Primary MD to go over all  hospital tests and procedure/radiological results at the follow up, please ask your  Primary MD to get all Hospital records sent to his/her office.  In some cases, they will be blood work, cultures and biopsy results pending at the time of your discharge. Please request that your primary care M.D. follows up on these results.  Also Note the following: If you experience worsening of your admission symptoms, develop shortness of breath, life threatening emergency, suicidal or homicidal thoughts you must seek medical attention immediately by calling 911 or calling your MD immediately  if symptoms less severe.  You must read complete instructions/literature along with all the possible adverse reactions/side effects for all the Medicines you take and that have been prescribed to you. Take any new Medicines after you have completely understood and accpet all the possible adverse reactions/side effects.   Do not drive when taking Pain medications or sleeping medications (Benzodaizepines)  Do not take more than prescribed Pain, Sleep and Anxiety Medications. It is not advisable to combine anxiety,sleep and pain medications without talking with your primary care practitioner  Special Instructions: If you have smoked or chewed Tobacco  in the last 2 yrs please stop smoking, stop any regular Alcohol  and or any Recreational drug use.  Wear Seat belts while driving.  Please note: You were cared for by a hospitalist during your hospital stay. Once you are discharged, your primary care physician will handle any further medical issues. Please note that NO REFILLS for any discharge medications will be authorized once you are discharged, as it is imperative that you return to your primary care physician (or establish a relationship with a primary care physician if you do not have one) for your post hospital discharge needs so that they can reassess your need for medications and monitor your lab values.   Increase activity slowly   Complete by:  As directed      Allergies as of 01/17/2018       Reactions   Oxycodone Other (See Comments)   Renders the patient DIFFICULT TO WAKE UP   Tramadol Nausea And Vomiting   Lisinopril Other (See Comments)   Hyperkalemia   Losartan Other (See Comments)   Hyperkalemia      Medication List    STOP taking these medications   bumetanide 2 MG tablet Commonly known as:  BUMEX   traMADol 50 MG tablet Commonly known as:  ULTRAM     TAKE these medications   acetaminophen 325 MG tablet Commonly known as:  TYLENOL Take 2 tablets (650 mg total) by mouth every 4 (four) hours as needed for mild pain, fever or headache.   ACETYL L-CARNITINE PO Take 2 capsules by mouth daily.   Acetyl-L-Carnitine HCl Powd Take 1 capsule by mouth daily.   ALPRAZolam 0.5 MG tablet Commonly known as:  XANAX Take 1 tablet (0.5 mg total) by mouth at bedtime as needed for anxiety.   aspirin EC 81 MG tablet Take 81 mg by mouth daily.   atorvastatin 20 MG tablet Commonly known as:  LIPITOR Take 1 tablet (20 mg total) by mouth daily at 6 PM.   busPIRone 5 MG tablet Commonly known as:  BUSPAR Take 2.5 mg by mouth 2 (two) times daily.   Ca Carbonate-Mag Hydroxide 550-110 MG Chew Chew 1-2 tablets by mouth every 4 (four) hours as needed (for indigestion).   carvedilol 3.125 MG tablet Commonly known as:  COREG Take 3.125 mg by mouth See admin instructions. Take 3.125 mg by  mouth two times day and hold for a heart rate <90 or Systolic b/p reading <300   cinacalcet 30 MG tablet Commonly known as:  SENSIPAR Take 30 mg by mouth daily.   clopidogrel 75 MG tablet Commonly known as:  PLAVIX Take 75 mg by mouth daily.   diazepam 5 MG tablet Commonly known as:  VALIUM Take 0.5 tablets (2.5 mg total) by mouth every 8 (eight) hours as needed for muscle spasms.   DSS 100 MG Caps Take 100 mg by mouth 2 (two) times daily.   gabapentin 100 MG capsule Commonly known as:  NEURONTIN Take 1 capsule (100 mg total) by mouth at bedtime.   insulin aspart 100 UNIT/ML  injection Commonly known as:  novoLOG insulin aspart (novoLOG) injection 0-9 Units 0-9 Units, Subcutaneous, 3 times daily with meals CBG < 70: implement hypoglycemia protocol CBG 70 - 120: 0 units CBG 121 - 150: 1 unit CBG 151 - 200: 2 units CBG 201 - 250: 3 units CBG 251 - 300: 5 units CBG 301 - 350: 7 units CBG 351 - 400: 9 units CBG > 400: call MD   Insulin Detemir 100 UNIT/ML Pen Commonly known as:  LEVEMIR Inject 5 Units into the skin at bedtime.   isosorbide-hydrALAZINE 20-37.5 MG tablet Commonly known as:  BIDIL Take 1 tablet by mouth 2 (two) times daily.   ketoconazole 2 % cream Commonly known as:  NIZORAL Apply 1 application topically 2 (two) times daily as needed (to dry areas of skin).   ketorolac 0.5 % ophthalmic solution Commonly known as:  ACULAR Place 1 drop into the left eye 4 (four) times daily as needed (AS DIRECTED).   lactobacillus acidophilus Tabs tablet Take 2 tablets by mouth daily.   magic mouthwash Soln 5 mLs 4 (four) times daily as needed for mouth pain (for 7 days, as directed).   Melatonin 3 MG Caps Take 3 mg by mouth at bedtime as needed (for insomnia).   midodrine 10 MG tablet Commonly known as:  PROAMATINE 10 mg, Oral, Every T-Th-Sa (Hemodialysis),Do NOT hold for dialysis. GIVE DOSE DURING DAYLIGHT HOURS ONLY What changed:    medication strength  how much to take  how to take this  when to take this  additional instructions   multivitamin Tabs tablet Take 1 tablet by mouth at bedtime.   NEPRO Liqd Take 237 mLs by mouth 3 (three) times daily with meals.   nitroGLYCERIN 0.4 MG SL tablet Commonly known as:  NITROSTAT Place 1 tablet (0.4 mg total) under the tongue every 5 (five) minutes x 3 doses as needed for chest pain.   ondansetron 4 MG tablet Commonly known as:  ZOFRAN Take 4 mg by mouth every 8 (eight) hours as needed for nausea or vomiting (x72 hours. If not effective after 2 doses, increase to 8 mg).   oxyCODONE 5  MG immediate release tablet Commonly known as:  Oxy IR/ROXICODONE Take 1 tablet (5 mg total) by mouth See admin instructions. Take 5 mg by mouth once a day and an additional 5 mg every six hours as needed for pain   OXYGEN Inhale 2 L into the lungs as needed (shortness of breath).   pantoprazole 40 MG tablet Commonly known as:  PROTONIX Take 1 tablet (40 mg total) by mouth daily.   primidone 50 MG tablet Commonly known as:  MYSOLINE Take 100 mg by mouth daily.   sevelamer carbonate 800 MG tablet Commonly known as:  RENVELA Take 2 tablets (1,600  mg total) by mouth 3 (three) times daily with meals. What changed:  how much to take   vitamin B-12 500 MCG tablet Commonly known as:  CYANOCOBALAMIN Take 1,000 mcg by mouth daily.   Vitamin D3 50 MCG (2000 UT) Tabs Take 2,000 Units by mouth daily.   Vitamin D 50 MCG (2000 UT) tablet Take 2,000 Units by mouth daily.      Follow-up Information    [name], MD. Schedule an appointment as soon as possible for a visit in 1 week(s).   Specialty:  Internal Medicine Contact information: [place] [address] [phone]        Burnell Blanks, MD. Schedule an appointment as soon as possible for a visit in 2 week(s).   Specialty:  Cardiology Contact information: [address] [place] [address] [phone]        Hemodialysis clinic Follow up.   Why:  At your usual schedule         Allergies  Allergen Reactions  . Oxycodone Other (See Comments)    Renders the patient DIFFICULT TO WAKE UP  . Tramadol Nausea And Vomiting  . Lisinopril Other (See Comments)    Hyperkalemia   . Losartan Other (See Comments)    Hyperkalemia     Consultations:   nephrology  Other Procedures/Studies: Ct Abdomen Pelvis Wo Contrast  Result Date: 01/14/2018 CLINICAL DATA:  Pain all over. EXAM: CT ABDOMEN AND PELVIS WITHOUT CONTRAST TECHNIQUE: Multidetector CT imaging of the  abdomen and pelvis was performed following the standard protocol without IV contrast. COMPARISON:  12/05/2017 FINDINGS: Lower chest: Heart is enlarged. Coronary artery calcification is evident. Central line tip identified distal SVC. Large right pleural effusion is associated with right lower lobe collapse/consolidation. There is a small left pleural effusion. Hepatobiliary: No focal abnormality in the liver on this study without intravenous contrast. Layering tiny gallstones evident. No intrahepatic or extrahepatic biliary dilation. Pancreas: Pancreas is diffusely atrophic with parenchymal calcification in the region the pancreatic head. Imaging features are stable and likely reflect chronic pancreatitis. Spleen: No focal mass lesion. No dilatation of the main duct. No intraparenchymal cyst. No peripancreatic edema. Adrenals/Urinary Tract: Right adrenal gland unremarkable. Stable 13 mm left adrenal nodule. Low attenuation of this nodule suggests adrenal adenoma. Tiny stones noted in the left kidney. Right kidney unremarkable. No evidence for hydroureter. The urinary bladder appears normal for the degree of distention. Stomach/Bowel: Stomach is nondistended. No gastric wall thickening. No evidence of outlet obstruction. Duodenum is normally positioned as is the ligament of Treitz. No small bowel wall thickening. No small bowel dilatation. The terminal ileum is normal. The appendix is normal. No gross colonic mass. No colonic wall thickening. Vascular/Lymphatic: There is abdominal aortic atherosclerosis without aneurysm. There is no gastrohepatic or hepatoduodenal ligament lymphadenopathy. No intraperitoneal or retroperitoneal lymphadenopathy. No pelvic sidewall lymphadenopathy. Reproductive: The prostate gland and seminal vesicles have normal imaging features. Other: Small volume intraperitoneal free fluid evident. There is diffuse body wall edema associated with mesenteric and presacral edema. Musculoskeletal:  Small left groin hernia contains only fat. No worrisome lytic or sclerotic osseous abnormality. IMPRESSION: 1. No acute findings in the abdomen or pelvis. 2. Large right pleural effusion with right lower lobe collapse/consolidation. 3. Small left pleural effusion. 4. Small volume intraperitoneal free fluid with diffuse body wall edema. 5. Cholelithiasis. 6. Stable left adrenal adenoma. Electronically Signed   By: Misty Stanley M.D.   On: 01/14/2018 20:12   Dg  Chest 1 View  Result Date: 01/15/2018 CLINICAL DATA:  Post thoracentesis EXAM: CHEST  1 VIEW COMPARISON:  01/14/2018 FINDINGS: RIGHT jugular dual-lumen central venous catheter with tip projecting over SVC. Enlargement of cardiac silhouette with pulmonary vascular congestion. Decreased RIGHT pleural effusion and basilar atelectasis since previous exam. No pneumothorax post thoracentesis. Bones demineralized with old posttraumatic deformity of the proximal RIGHT humerus. Atherosclerotic calcification aortic arch. IMPRESSION: Decreased RIGHT pleural effusion and basilar atelectasis post thoracentesis without pneumothorax. Enlargement of cardiac silhouette with pulmonary vascular congestion. Electronically Signed   By: Lavonia Dana M.D.   On: 01/15/2018 11:25   Dg Chest 1 View  Result Date: 01/14/2018 CLINICAL DATA:  Shortness of breath, abdominal pain EXAM: CHEST  1 VIEW COMPARISON:  Portable exam 1856 hours compared to 12/11/2017 FINDINGS: RIGHT jugular line with tip projecting over SVC near cavoatrial junction. Enlargement of cardiac silhouette with pulmonary vascular congestion. Moderate to large RIGHT pleural effusion with basilar atelectasis. Underlying pulmonary edema. Atherosclerotic calcification aorta. No pneumothorax. Bones demineralized. IMPRESSION: Enlargement of cardiac silhouette with pulmonary vascular congestion and mild pulmonary edema. Moderate to large RIGHT pleural effusion and basilar atelectasis increased since prior study.  Electronically Signed   By: Lavonia Dana M.D.   On: 01/14/2018 19:11   Dg Femur Min 2 Views Left  Result Date: 01/15/2018 CLINICAL DATA:  Distal femoral pain EXAM: LEFT FEMUR 2 VIEWS COMPARISON:  None FINDINGS: Prior above knee amputation. Osseous mineralization normal. No fracture, dislocation, or bone destruction. Skin clips at stump. Scattered vascular calcifications. IMPRESSION: Post above knee amputation. No acute abnormalities. Electronically Signed   By: Lavonia Dana M.D.   On: 01/15/2018 11:25   Ct Angio Abd/pel W/ And/or W/o  Result Date: 01/16/2018 CLINICAL DATA:  Acute onset of recurrent generalized abdominal pain. Patient on dialysis. Evaluate for bowel ischemia. EXAM: CTA ABDOMEN AND PELVIS wITHOUT AND WITH CONTRAST TECHNIQUE: Multidetector CT imaging of the abdomen and pelvis was performed using the standard protocol during bolus administration of intravenous contrast. Multiplanar reconstructed images and MIPs were obtained and reviewed to evaluate the vascular anatomy. CONTRAST:  19mL ISOVUE-370 IOPAMIDOL (ISOVUE-370) INJECTION 76% COMPARISON:  CT of the abdomen and pelvis performed 01/14/2018 FINDINGS: VASCULAR Aorta: Scattered calcification is noted along the abdominal aorta, with mild associated mural thrombus but no significant luminal narrowing. Celiac: The celiac trunk appears grossly intact, with mild scattered calcification. SMA: The superior mesenteric artery remains intact, with scattered calcification along the superior mesenteric artery. Renals: The renal arteries appear intact bilaterally, though scattered calcification is noted at the proximal renal arteries bilaterally. IMA: There is diffuse narrowing along the proximal inferior mesenteric artery, though it appears patent more distally. Inflow: Scattered calcification is noted along the common, internal and external iliac arteries, with mild luminal narrowing along the internal iliac arteries. Proximal Outflow: The common  femoral arteries demonstrate scattered calcification, but appear patent. Veins: Visualized venous structures are unremarkable in appearance. Review of the MIP images confirms the above findings. NON-VASCULAR Lower chest: Small bilateral pleural effusions are noted. Bibasilar airspace opacification is noted, particularly at the right lung base, concerning for multifocal pneumonia. There is effacement of the right mainstem bronchus, thought to reflect underlying aspiration. Hepatobiliary: The liver is grossly unremarkable in appearance. Trace ascites noted tracking about the liver. Minimal soft tissue inflammation about the gallbladder is nonspecific in the presence of ascites. Cholelithiasis is noted. The common bile duct remains normal in caliber. Pancreas: The pancreas is within normal limits. Spleen: The spleen is unremarkable in appearance.  Trace fluid is noted tracking about the spleen. Adrenals/Urinary Tract: The adrenal glands are unremarkable in appearance. Mild to moderate bilateral renal atrophy is noted. Nonspecific perinephric stranding is noted bilaterally. There is no evidence of hydronephrosis. No renal or ureteral stones are identified. Stomach/Bowel: Mild scattered diverticulosis is noted along much of the colon, with sparing of the sigmoid colon. The small bowel is decompressed and grossly unremarkable. The stomach is within normal limits. Lymphatic: No retroperitoneal or pelvic sidewall lymphadenopathy is seen. Reproductive: The bladder is mildly distended and grossly unremarkable. The prostate remains normal in size, with scattered calcification. Other: Diffuse soft tissue edema along the abdominal wall may reflect anasarca. Musculoskeletal: No acute osseous abnormalities are identified. The visualized musculature is unremarkable in appearance. IMPRESSION: VASCULAR 1. No evidence for bowel ischemia. Diffuse narrowing along the proximal inferior mesenteric artery, though it appears patent distally.  The superior mesenteric artery remains patent. 2. Scattered calcification along the abdominal aorta and its branches, with mild mural thrombus but no significant luminal narrowing at the abdominal aorta. NON-VASCULAR 1. Small bilateral pleural effusions. Bibasilar airspace opacification, right greater than left, concerning for multifocal pneumonia. Effacement of the right mainstem bronchus is suspicious for aspiration. 2. Trace ascites noted tracking about the liver. Minimal soft tissue inflammation about the gallbladder is nonspecific in the presence of ascites. Cholelithiasis noted. 3. Mild to moderate bilateral renal atrophy. 4. Mild scattered diverticulosis along much of the colon, with sparing of the sigmoid colon. 5. Diffuse soft tissue edema along the abdominal wall may reflect anasarca. Aortic Atherosclerosis (ICD10-I70.0). Electronically Signed   By: Garald Balding M.D.   On: 01/16/2018 01:47   US Thoracentesis Asp Pleural Space W/img Guide  Result Date: 01/15/2018 INDICATION: Patient with history of heart failure, ESRD, volume overload, dyspnea, and right pleural effusion. Request is made for diagnostic and therapeutic right thoracentesis. EXAM: ULTRASOUND GUIDED DIAGNOSTIC AND THERAPEUTIC RIGHT THORACENTESIS MEDICATIONS: 10 mL of 1% lidocaine COMPLICATIONS: None immediate. PROCEDURE: An ultrasound guided thoracentesis was thoroughly discussed with the patient and questions answered. The benefits, risks, alternatives and complications were also discussed. The patient understands and wishes to proceed with the procedure. Written consent was obtained. Ultrasound was performed to localize and mark an adequate pocket of fluid in the right chest. The area was then prepped and draped in the normal sterile fashion. 1% Lidocaine was used for local anesthesia. Under ultrasound guidance a 6 Fr Safe-T-Centesis catheter was introduced. Thoracentesis was performed. The catheter was removed and a dressing applied.  FINDINGS: A total of approximately 2 L of clear yellow fluid was removed. Samples were sent to the laboratory as requested by the clinical team. IMPRESSION: Successful ultrasound guided right thoracentesis yielding 2 L of pleural fluid. Read by: Earley Abide, PA-C Electronically Signed   By: Aletta Edouard M.D.   On: 01/15/2018 11:36     TODAY-DAY OF DISCHARGE:  Subjective:   Mitchell Garcia today has no headache,no chest abdominal pain,no new weakness tingling or numbness, feels much better wants to go back to [facility]  Objective:   Blood pressure 129/61, pulse (!) 54, temperature (!) 97.5 F (36.4 C), temperature source Oral, resp. rate 20, height 5\' 10"  (1.778 m), weight 78.1 kg, SpO2 100 %. No intake or output data in the 24 hours ending 01/17/18 1204 Filed Weights   01/16/18 0735 01/16/18 1203 01/17/18 0500  Weight: 80.4 kg 76.6 kg 78.1 kg    Exam: Awake Alert, Oriented *3, No new F.N deficits, Normal affect [place].AT,PERRAL Supple Neck,No  JVD, No cervical lymphadenopathy appriciated.  Symmetrical Chest wall movement, Good air movement bilaterally, CTAB RRR,No Gallops,Rubs or new Murmurs, No Parasternal Heave +ve B.Sounds, Abd Soft, Non tender, No organomegaly appriciated, No rebound -guarding or rigidity. No Cyanosis, Clubbing or edema, No new Rash or bruise   PERTINENT RADIOLOGIC STUDIES: Ct Abdomen Pelvis Wo Contrast  Result Date: 01/14/2018 CLINICAL DATA:  Pain all over. EXAM: CT ABDOMEN AND PELVIS WITHOUT CONTRAST TECHNIQUE: Multidetector CT imaging of the abdomen and pelvis was performed following the standard protocol without IV contrast. COMPARISON:  12/05/2017 FINDINGS: Lower chest: Heart is enlarged. Coronary artery calcification is evident. Central line tip identified distal SVC. Large right pleural effusion is associated with right lower lobe collapse/consolidation. There is a small left pleural effusion. Hepatobiliary: No focal abnormality in the liver on this  study without intravenous contrast. Layering tiny gallstones evident. No intrahepatic or extrahepatic biliary dilation. Pancreas: Pancreas is diffusely atrophic with parenchymal calcification in the region the pancreatic head. Imaging features are stable and likely reflect chronic pancreatitis. Spleen: No focal mass lesion. No dilatation of the main duct. No intraparenchymal cyst. No peripancreatic edema. Adrenals/Urinary Tract: Right adrenal gland unremarkable. Stable 13 mm left adrenal nodule. Low attenuation of this nodule suggests adrenal adenoma. Tiny stones noted in the left kidney. Right kidney unremarkable. No evidence for hydroureter. The urinary bladder appears normal for the degree of distention. Stomach/Bowel: Stomach is nondistended. No gastric wall thickening. No evidence of outlet obstruction. Duodenum is normally positioned as is the ligament of Treitz. No small bowel wall thickening. No small bowel dilatation. The terminal ileum is normal. The appendix is normal. No gross colonic mass. No colonic wall thickening. Vascular/Lymphatic: There is abdominal aortic atherosclerosis without aneurysm. There is no gastrohepatic or hepatoduodenal ligament lymphadenopathy. No intraperitoneal or retroperitoneal lymphadenopathy. No pelvic sidewall lymphadenopathy. Reproductive: The prostate gland and seminal vesicles have normal imaging features. Other: Small volume intraperitoneal free fluid evident. There is diffuse body wall edema associated with mesenteric and presacral edema. Musculoskeletal: Small left groin hernia contains only fat. No worrisome lytic or sclerotic osseous abnormality. IMPRESSION: 1. No acute findings in the abdomen or pelvis. 2. Large right pleural effusion with right lower lobe collapse/consolidation. 3. Small left pleural effusion. 4. Small volume intraperitoneal free fluid with diffuse body wall edema. 5. Cholelithiasis. 6. Stable left adrenal adenoma. Electronically Signed   By: Misty Stanley M.D.   On: 01/14/2018 20:12   Dg Chest 1 View  Result Date: 01/15/2018 CLINICAL DATA:  Post thoracentesis EXAM: CHEST  1 VIEW COMPARISON:  01/14/2018 FINDINGS: RIGHT jugular dual-lumen central venous catheter with tip projecting over SVC. Enlargement of cardiac silhouette with pulmonary vascular congestion. Decreased RIGHT pleural effusion and basilar atelectasis since previous exam. No pneumothorax post thoracentesis. Bones demineralized with old posttraumatic deformity of the proximal RIGHT humerus. Atherosclerotic calcification aortic arch. IMPRESSION: Decreased RIGHT pleural effusion and basilar atelectasis post thoracentesis without pneumothorax. Enlargement of cardiac silhouette with pulmonary vascular congestion. Electronically Signed   By: Lavonia Dana M.D.   On: 01/15/2018 11:25   Dg Chest 1 View  Result Date: 01/14/2018 CLINICAL DATA:  Shortness of breath, abdominal pain EXAM: CHEST  1 VIEW COMPARISON:  Portable exam 1856 hours compared to 12/11/2017 FINDINGS: RIGHT jugular line with tip projecting over SVC near cavoatrial junction. Enlargement of cardiac silhouette with pulmonary vascular congestion. Moderate to large RIGHT pleural effusion with basilar atelectasis. Underlying pulmonary edema. Atherosclerotic calcification aorta. No pneumothorax. Bones demineralized. IMPRESSION: Enlargement of cardiac silhouette with pulmonary vascular  congestion and mild pulmonary edema. Moderate to large RIGHT pleural effusion and basilar atelectasis increased since prior study. Electronically Signed   By: Lavonia Dana M.D.   On: 01/14/2018 19:11   Dg Femur Min 2 Views Left  Result Date: 01/15/2018 CLINICAL DATA:  Distal femoral pain EXAM: LEFT FEMUR 2 VIEWS COMPARISON:  None FINDINGS: Prior above knee amputation. Osseous mineralization normal. No fracture, dislocation, or bone destruction. Skin clips at stump. Scattered vascular calcifications. IMPRESSION: Post above knee amputation. No acute  abnormalities. Electronically Signed   By: Lavonia Dana M.D.   On: 01/15/2018 11:25   Ct Angio Abd/pel W/ And/or W/o  Result Date: 01/16/2018 CLINICAL DATA:  Acute onset of recurrent generalized abdominal pain. Patient on dialysis. Evaluate for bowel ischemia. EXAM: CTA ABDOMEN AND PELVIS wITHOUT AND WITH CONTRAST TECHNIQUE: Multidetector CT imaging of the abdomen and pelvis was performed using the standard protocol during bolus administration of intravenous contrast. Multiplanar reconstructed images and MIPs were obtained and reviewed to evaluate the vascular anatomy. CONTRAST:  34mL ISOVUE-370 IOPAMIDOL (ISOVUE-370) INJECTION 76% COMPARISON:  CT of the abdomen and pelvis performed 01/14/2018 FINDINGS: VASCULAR Aorta: Scattered calcification is noted along the abdominal aorta, with mild associated mural thrombus but no significant luminal narrowing. Celiac: The celiac trunk appears grossly intact, with mild scattered calcification. SMA: The superior mesenteric artery remains intact, with scattered calcification along the superior mesenteric artery. Renals: The renal arteries appear intact bilaterally, though scattered calcification is noted at the proximal renal arteries bilaterally. IMA: There is diffuse narrowing along the proximal inferior mesenteric artery, though it appears patent more distally. Inflow: Scattered calcification is noted along the common, internal and external iliac arteries, with mild luminal narrowing along the internal iliac arteries. Proximal Outflow: The common femoral arteries demonstrate scattered calcification, but appear patent. Veins: Visualized venous structures are unremarkable in appearance. Review of the MIP images confirms the above findings. NON-VASCULAR Lower chest: Small bilateral pleural effusions are noted. Bibasilar airspace opacification is noted, particularly at the right lung base, concerning for multifocal pneumonia. There is effacement of the right mainstem  bronchus, thought to reflect underlying aspiration. Hepatobiliary: The liver is grossly unremarkable in appearance. Trace ascites noted tracking about the liver. Minimal soft tissue inflammation about the gallbladder is nonspecific in the presence of ascites. Cholelithiasis is noted. The common bile duct remains normal in caliber. Pancreas: The pancreas is within normal limits. Spleen: The spleen is unremarkable in appearance. Trace fluid is noted tracking about the spleen. Adrenals/Urinary Tract: The adrenal glands are unremarkable in appearance. Mild to moderate bilateral renal atrophy is noted. Nonspecific perinephric stranding is noted bilaterally. There is no evidence of hydronephrosis. No renal or ureteral stones are identified. Stomach/Bowel: Mild scattered diverticulosis is noted along much of the colon, with sparing of the sigmoid colon. The small bowel is decompressed and grossly unremarkable. The stomach is within normal limits. Lymphatic: No retroperitoneal or pelvic sidewall lymphadenopathy is seen. Reproductive: The bladder is mildly distended and grossly unremarkable. The prostate remains normal in size, with scattered calcification. Other: Diffuse soft tissue edema along the abdominal wall may reflect anasarca. Musculoskeletal: No acute osseous abnormalities are identified. The visualized musculature is unremarkable in appearance. IMPRESSION: VASCULAR 1. No evidence for bowel ischemia. Diffuse narrowing along the proximal inferior mesenteric artery, though it appears patent distally. The superior mesenteric artery remains patent. 2. Scattered calcification along the abdominal aorta and its branches, with mild mural thrombus but no significant luminal narrowing at the abdominal aorta. NON-VASCULAR 1. Small bilateral  pleural effusions. Bibasilar airspace opacification, right greater than left, concerning for multifocal pneumonia. Effacement of the right mainstem bronchus is suspicious for aspiration.  2. Trace ascites noted tracking about the liver. Minimal soft tissue inflammation about the gallbladder is nonspecific in the presence of ascites. Cholelithiasis noted. 3. Mild to moderate bilateral renal atrophy. 4. Mild scattered diverticulosis along much of the colon, with sparing of the sigmoid colon. 5. Diffuse soft tissue edema along the abdominal wall may reflect anasarca. Aortic Atherosclerosis (ICD10-I70.0). Electronically Signed   By: Garald Balding M.D.   On: 01/16/2018 01:47   US Thoracentesis Asp Pleural Space W/img Guide  Result Date: 01/15/2018 INDICATION: Patient with history of heart failure, ESRD, volume overload, dyspnea, and right pleural effusion. Request is made for diagnostic and therapeutic right thoracentesis. EXAM: ULTRASOUND GUIDED DIAGNOSTIC AND THERAPEUTIC RIGHT THORACENTESIS MEDICATIONS: 10 mL of 1% lidocaine COMPLICATIONS: None immediate. PROCEDURE: An ultrasound guided thoracentesis was thoroughly discussed with the patient and questions answered. The benefits, risks, alternatives and complications were also discussed. The patient understands and wishes to proceed with the procedure. Written consent was obtained. Ultrasound was performed to localize and mark an adequate pocket of fluid in the right chest. The area was then prepped and draped in the normal sterile fashion. 1% Lidocaine was used for local anesthesia. Under ultrasound guidance a 6 Fr Safe-T-Centesis catheter was introduced. Thoracentesis was performed. The catheter was removed and a dressing applied. FINDINGS: A total of approximately 2 L of clear yellow fluid was removed. Samples were sent to the laboratory as requested by the clinical team. IMPRESSION: Successful ultrasound guided right thoracentesis yielding 2 L of pleural fluid. Read by: Earley Abide, PA-C Electronically Signed   By: Aletta Edouard M.D.   On: 01/15/2018 11:36     PERTINENT LAB RESULTS: CBC: Recent Labs    01/16/18 0944 01/17/18 0714    WBC 13.8* 12.1*  HGB 8.0* 7.4*  HCT 27.4* 26.3*  PLT 393 336   CMET CMP     Component Value Date/Time   NA 136 01/17/2018 0424   NA 144 11/01/2016 1532   K 4.1 01/17/2018 0424   CL 96 (L) 01/17/2018 0424   CO2 26 01/17/2018 0424   GLUCOSE 107 (H) 01/17/2018 0424   BUN 18 01/17/2018 0424   BUN 66 (H) 11/01/2016 1532   CREATININE 2.15 (H) 01/17/2018 0424   CALCIUM 7.5 (L) 01/17/2018 0424   CALCIUM 8.2 (L) 05/19/2017 0230   PROT 5.4 (L) 01/15/2018 0750   ALBUMIN 2.3 (L) 01/17/2018 0424   AST 24 01/15/2018 0750   ALT 23 01/15/2018 0750   ALKPHOS 93 01/15/2018 0750   BILITOT 0.4 01/15/2018 0750   GFRNONAA 29 (L) 01/17/2018 0424   GFRAA 34 (L) 01/17/2018 0424    GFR Estimated Creatinine Clearance: 31.1 mL/min (A) (by C-G formula based on SCr of 2.15 mg/dL (H)). Recent Labs    01/14/18 1928  LIPASE 17   No results for input(s): CKTOTAL, CKMB, CKMBINDEX, TROPONINI in the last 72 hours. Invalid input(s): POCBNP No results for input(s): DDIMER in the last 72 hours. No results for input(s): HGBA1C in the last 72 hours. No results for input(s): CHOL, HDL, LDLCALC, TRIG, CHOLHDL, LDLDIRECT in the last 72 hours. No results for input(s): TSH, T4TOTAL, T3FREE, THYROIDAB in the last 72 hours.  Invalid input(s): FREET3 Recent Labs    01/16/18 1335  TIBC 210*  IRON 36*   Coags: Recent Labs    01/14/18 1928  INR 1.10  Microbiology: Recent Results (from the past 240 hour(s))  MRSA PCR Screening     Status: None   Collection Time: 01/14/18 11:41 PM  Result Value Ref Range Status   MRSA by PCR NEGATIVE NEGATIVE Final    Comment:        The GeneXpert MRSA Assay (FDA approved for NASAL specimens only), is one component of a comprehensive MRSA colonization surveillance program. It is not intended to diagnose MRSA infection nor to guide or monitor treatment for MRSA infections. Performed at [facility], [place] [address], [place], [place] [address]   Culture,  body fluid-bottle     Status: None (Preliminary result)   Collection Time: 01/15/18 11:42 AM  Result Value Ref Range Status   Specimen Description PLEURAL RIGHT  Final   Special Requests NONE  Final   Culture   Final    NO GROWTH 2 DAYS Performed at [facility], [place] [address] [place], [place] [address]    Report Status PENDING  Incomplete  Gram stain     Status: None   Collection Time: 01/15/18 11:42 AM  Result Value Ref Range Status   Specimen Description PLEURAL RIGHT  Final   Special Requests NONE  Final   Gram Stain   Final    RARE WBC PRESENT, PREDOMINANTLY PMN NO ORGANISMS SEEN Performed at [facility], [place] [address] [place], [place] [address]    Report Status 01/15/2018 FINAL  Final    FURTHER DISCHARGE INSTRUCTIONS:  Get Medicines reviewed and adjusted: Please take all your medications with you for your next visit with your Primary MD  Laboratory/radiological data: Please request your Primary MD to go over all hospital tests and procedure/radiological results at the follow up, please ask your Primary MD to get all Hospital records sent to his/her office.  In some cases, they will be blood work, cultures and biopsy results pending at the time of your discharge. Please request that your primary care M.D. goes through all the records of your hospital data and follows up on these results.  Also Note the following: If you experience worsening of your admission symptoms, develop shortness of breath, life threatening emergency, suicidal or homicidal thoughts you must seek medical attention immediately by calling 911 or calling your MD immediately  if symptoms less severe.  You must read complete instructions/literature along with all the possible adverse reactions/side effects for all the Medicines you take and that have been prescribed to you. Take any new Medicines after you have completely understood and accpet all the possible adverse reactions/side effects.    Do not drive when taking Pain medications or sleeping medications (Benzodaizepines)  Do not take more than prescribed Pain, Sleep and Anxiety Medications. It is not advisable to combine anxiety,sleep and pain medications without talking with your primary care practitioner  Special Instructions: If you have smoked or chewed Tobacco  in the last 2 yrs please stop smoking, stop any regular Alcohol  and or any Recreational drug use.  Wear Seat belts while driving.  Please note: You were cared for by a hospitalist during your hospital stay. Once you are discharged, your primary care physician will handle any further medical issues. Please note that NO REFILLS for any discharge medications will be authorized once you are discharged, as it is imperative that you return to your primary care physician (or establish a relationship with a primary care physician if you do not have one) for your post hospital discharge needs  so that they can reassess your need for medications and monitor your lab values.  Total Time spent coordinating discharge including counseling, education and face to face time equals 35 minutes.  SignedOren Binet 01/17/2018 12:04 PM

## 2018-01-17 NOTE — Progress Notes (Signed)
Pharmacist Heart Failure Core Measure Documentation  Assessment: Mitchell Garcia has an EF documented as 25-30% on 12/08/17 by ECHO.  Rationale: Heart failure patients with left ventricular systolic dysfunction (LVSD) and an EF < 40% should be prescribed an angiotensin converting enzyme inhibitor (ACEI) or angiotensin receptor blocker (ARB) at discharge unless a contraindication is documented in the medical record.  This patient is not currently on an ACEI or ARB for HF.  This note is being placed in the record in order to provide documentation that a contraindication to the use of these agents is present for this encounter.  ACE Inhibitor or Angiotensin Receptor Blocker is contraindicated (specify all that apply)  []   ACEI allergy AND ARB allergy []   Angioedema []   Moderate or severe aortic stenosis []   Hyperkalemia []   Hypotension []   Renal artery stenosis [x]   Worsening renal function, preexisting renal disease or dysfunction - chronic HD patient   Jackson Latino, PharmD PGY1 Pharmacy Resident Phone [phone] 01/17/2018     1:59 PM

## 2018-01-17 NOTE — Clinical Social Work Note (Signed)
CSW facilitated patient discharge including contacting patient family (wife) and facility to confirm patient discharge plans. Clinical information faxed to facility and family agreeable with plan. CSW arranged ambulance transport via [facility] to [facility]. RN to call report prior to discharge [phone]).  CSW will sign off for now as social work intervention is no longer needed. Please consult [place] again if new needs arise.  Dayton Scrape, [place]

## 2018-01-17 NOTE — ED Provider Notes (Signed)
[facility] DEPT Provider Note   CSN: [id number] Arrival date & time: 01/17/18  2226     History   Chief Complaint Chief Complaint  Patient presents with  . Suicidal    HPI Mitchell Garcia is a 72 y.o. male.  The history is provided by the patient and medical records.    72 year old male with history of coronary artery disease, cancer, CHF, end-stage renal disease on hemodialysis, diabetes, hypertension, hyperlipidemia, prior stroke, presenting to the ED with suicidal ideation.  Patient is from [place], staff reports he is refusing to take any of his meds.  When questioning patient about this he states "they are trying to force it on me but I do not care anymore".  States he feels like giving up.  He denies any attempts at self-harm aside from refusing meds.  No reported homicidal ideation.  No illicit drug or alcohol abuse.  Patient just discharged from hospital earlier today after admission volume overload.  Last dialysis earlier today.  Of note, patient reportedly told RN that he would get a gun and shoot himself if he could.  States he just does not want to live anymore.  Past Medical History:  Diagnosis Date  . Acute kidney injury superimposed on chronic kidney disease ([place]) 10/15/2016  . Altered mental state 01/25/2013  . Anemia of chronic disease 01/25/2013  . CAD S/P percutaneous coronary angioplasty    a.not to be a candidate for CABG. On 9/19, went back to cath lab for PCI s/p orbital atherectomy with DES to ostial RCA, and PTCA/DES to distal RCA. Residual 50% prox LAD, 70% D1, CTO mLCx noted by initial diagnostic cath 10/08/16 to be treated medically.  . Cancer ([place])   . Cellulitis 07/2017   right leg  . Cellulitis of right lower extremity 07/25/2017  . Chronic combined systolic and diastolic CHF (congestive heart failure) ([place])   . CKD (chronic kidney disease), stage IV ([place])   . Diabetes mellitus with renal complications  (HCC)    uncontrolled  . Essential hypertension 10/15/2016  . Humerus head fracture 01/27/2013  . Hyperlipidemia   . Hyponatremia 01/25/2013  . Insulin dependent diabetes mellitus ([place])   . Ischemic cardiomyopathy 10/15/2016  . Left adrenal mass ([place]) 12/05/2017  . MI (myocardial infarction) ([place])    years ago  . Stroke ([place])   . Transaminitis 01/25/2013  . Uncontrolled diabetes mellitus ([place]) 10/15/2016    Patient Active Problem List   Diagnosis Date Noted  . Volume overload 01/14/2018  . Chronic pain 01/14/2018  . Chronic respiratory failure with hypoxia ([place]) 01/14/2018  . Unspecified atrial fibrillation ([place]) 01/14/2018  . Chronic systolic CHF (congestive heart failure) ([place]) 12/10/2017  . Poor venous access   . GI bleed 12/05/2017  . Large pleural effusion 12/05/2017  . Chronic pancreatitis ([place]) 12/05/2017  . Left adrenal mass ([place]) 12/05/2017  . Dermatitis   . Skin lesions   . Pressure injury of skin 07/07/2017  . ESRD on dialysis ([place]) 05/24/2017  . Acute on chronic combined systolic and diastolic CHF (congestive heart failure) ([place]) 05/17/2017  . Ischemic cardiomyopathy 10/15/2016  . CAD S/P percutaneous coronary angioplasty 10/15/2016  . DM (diabetes mellitus), type 2 with renal complications ([place]) 40/98/1191  . Essential hypertension 10/15/2016  . Acute combined systolic (congestive) and diastolic (congestive) heart failure ([place])   . Humerus head fracture 01/27/2013  . Anemia of chronic disease 01/25/2013    Past Surgical History:  Procedure Laterality Date  .  COLONOSCOPY WITH PROPOFOL N/A 08/09/2017   Procedure: COLONOSCOPY WITH PROPOFOL Hemostatic Clips;  Surgeon: Clarene Essex, MD;  Location: [facility];  Service: Endoscopy;  Laterality: N/A;  . CORONARY STENT INTERVENTION N/A 10/13/2016   Procedure: CORONARY STENT INTERVENTION;  Surgeon: Burnell Blanks, MD;  Location: [facility];  Service: Cardiovascular;  Laterality: N/A;  .  ESOPHAGOGASTRODUODENOSCOPY (EGD) WITH PROPOFOL N/A 08/08/2017   Procedure: ESOPHAGOGASTRODUODENOSCOPY (EGD) WITH PROPOFOL;  Surgeon: Clarene Essex, MD;  Location: [facility];  Service: Endoscopy;  Laterality: N/A;  . ESOPHAGOGASTRODUODENOSCOPY (EGD) WITH PROPOFOL N/A 12/07/2017   Procedure: ESOPHAGOGASTRODUODENOSCOPY (EGD) WITH PROPOFOL;  Surgeon: Clarene Essex, MD;  Location: [facility];  Service: Endoscopy;  Laterality: N/A;  . IR FLUORO GUIDE CV LINE RIGHT  05/19/2017  . IR FLUORO GUIDE CV LINE RIGHT  12/14/2017  . IR US GUIDE VASC ACCESS RIGHT  05/19/2017  . LEFT HEART CATH AND CORONARY ANGIOGRAPHY N/A 10/08/2016   Procedure: LEFT HEART CATH AND CORONARY ANGIOGRAPHY;  Surgeon: Nelva Bush, MD;  Location: [facility];  Service: Cardiovascular;  Laterality: N/A;  . SCHLEROTHERAPY  08/09/2017   Procedure: Woodward Ku OF AVM;  Surgeon: Clarene Essex, MD;  Location: [facility];  Service: Endoscopy;;  . SKIN CANCER EXCISION    . TEMPORARY PACEMAKER N/A 10/13/2016   Procedure: Temporary Pacemaker;  Surgeon: Burnell Blanks, MD;  Location: [facility];  Service: Cardiovascular;  Laterality: N/A;        Home Medications    Prior to Admission medications   Medication Sig Start Date End Date Taking? Authorizing Provider  acetaminophen (TYLENOL) 325 MG tablet Take 2 tablets (650 mg total) by mouth every 4 (four) hours as needed for mild pain, fever or headache. 05/24/17   Roxan Hockey, MD  Acetylcarnitine HCl (ACETYL L-CARNITINE PO) Take 2 capsules by mouth daily.    [provider]  Acetylcarnitine HCl (ACETYL-L-CARNITINE HCL) POWD Take 1 capsule by mouth daily.    [provider]  ALPRAZolam Duanne Moron) 0.5 MG tablet Take 1 tablet (0.5 mg total) by mouth at bedtime as needed for anxiety. 01/17/18   GhimireHenreitta Leber, MD  aspirin EC 81 MG tablet Take 81 mg by mouth daily.    [provider]  atorvastatin (LIPITOR) 20 MG tablet Take 1 tablet (20  mg total) by mouth daily at 6 PM. 12/13/17   Mariel Aloe, MD  busPIRone (BUSPAR) 5 MG tablet Take 2.5 mg by mouth 2 (two) times daily.    [provider]  Ca Carbonate-Mag Hydroxide 550-110 MG CHEW Chew 1-2 tablets by mouth every 4 (four) hours as needed (for indigestion).     [provider]  carvedilol (COREG) 3.125 MG tablet Take 3.125 mg by mouth See admin instructions. Take 3.125 mg by mouth two times day and hold for a heart rate <16 or Systolic b/p reading <967    [provider]  Cholecalciferol (VITAMIN D) 50 MCG (2000 UT) tablet Take 2,000 Units by mouth daily.    [provider]  Cholecalciferol (VITAMIN D3) 50 MCG (2000 UT) TABS Take 2,000 Units by mouth daily.    [provider]  cinacalcet (SENSIPAR) 30 MG tablet Take 30 mg by mouth daily.    [provider]  clopidogrel (PLAVIX) 75 MG tablet Take 75 mg by mouth daily.    [provider]  diazepam (VALIUM) 5 MG tablet Take 0.5 tablets (2.5 mg total) by mouth every 8 (eight) hours as needed for muscle spasms.  01/17/18   Ghimire, Henreitta Leber, MD  docusate sodium 100 MG CAPS Take 100 mg by mouth 2 (two) times daily. 02/01/13   Theodis Blaze, MD  gabapentin (NEURONTIN) 100 MG capsule Take 1 capsule (100 mg total) by mouth at bedtime. 12/13/17   Mariel Aloe, MD  insulin aspart (NOVOLOG) 100 UNIT/ML injection insulin aspart (novoLOG) injection 0-9 Units 0-9 Units, Subcutaneous, 3 times daily with meals CBG < 70: implement hypoglycemia protocol CBG 70 - 120: 0 units CBG 121 - 150: 1 unit CBG 151 - 200: 2 units CBG 201 - 250: 3 units CBG 251 - 300: 5 units CBG 301 - 350: 7 units CBG 351 - 400: 9 units CBG > 400: call MD 01/17/18   Jonetta Osgood, MD  Insulin Detemir (LEVEMIR) 100 UNIT/ML Pen Inject 5 Units into the skin at bedtime.    [provider]  isosorbide-hydrALAZINE (BIDIL) 20-37.5 MG tablet Take 1 tablet by mouth 2 (two) times daily. 05/24/17    Roxan Hockey, MD  ketoconazole (NIZORAL) 2 % cream Apply 1 application topically 2 (two) times daily as needed (to dry areas of skin).     [provider]  ketorolac (ACULAR) 0.5 % ophthalmic solution Place 1 drop into the left eye 4 (four) times daily as needed (AS DIRECTED).     [provider]  lactobacillus acidophilus (BACID) TABS tablet Take 2 tablets by mouth daily.     [provider]  magic mouthwash SOLN 5 mLs 4 (four) times daily as needed for mouth pain (for 7 days, as directed).    [provider]  Melatonin 3 MG CAPS Take 3 mg by mouth at bedtime as needed (for insomnia).     [provider]  midodrine (PROAMATINE) 10 MG tablet 10 mg, Oral, Every T-Th-Sa (Hemodialysis),Do NOT hold for dialysis. GIVE DOSE DURING DAYLIGHT HOURS ONLY 01/17/18   Ghimire, Henreitta Leber, MD  multivitamin (RENA-VIT) TABS tablet Take 1 tablet by mouth at bedtime. Patient not taking: Reported on 01/15/2018 05/24/17   Roxan Hockey, MD  nitroGLYCERIN (NITROSTAT) 0.4 MG SL tablet Place 1 tablet (0.4 mg total) under the tongue every 5 (five) minutes x 3 doses as needed for chest pain. 10/18/16   Cheryln Manly, NP  Nutritional Supplements (NEPRO) LIQD Take 237 mLs by mouth 3 (three) times daily with meals.    [provider]  ondansetron (ZOFRAN) 4 MG tablet Take 4 mg by mouth every 8 (eight) hours as needed for nausea or vomiting (x72 hours. If not effective after 2 doses, increase to 8 mg).    [provider]  oxyCODONE (OXY IR/ROXICODONE) 5 MG immediate release tablet Take 1 tablet (5 mg total) by mouth See admin instructions. Take 5 mg by mouth once a day and an additional 5 mg every six hours as needed for pain 01/17/18   Jonetta Osgood, MD  OXYGEN Inhale 2 L into the lungs as needed (shortness of breath).    [provider]  pantoprazole (PROTONIX) 40 MG tablet Take 1 tablet (40 mg total) by mouth daily. 12/13/17   Mariel Aloe,  MD  primidone (MYSOLINE) 50 MG tablet Take 100 mg by mouth daily.    [provider]  sevelamer carbonate (RENVELA) 800 MG tablet Take 2 tablets (1,600 mg total) by mouth 3 (three) times daily with meals. Patient taking differently: Take 800 mg by mouth 3 (three) times daily with meals.  07/10/17   Marjie Skiff, MD  vitamin B-12 (CYANOCOBALAMIN) 500 MCG tablet Take 1,000 mcg by mouth daily.    [provider]    Family History Family History  Problem Relation Age of Onset  . Hyperlipidemia Father     Social History Social History   Tobacco Use  . Smoking status: Never Smoker  . Smokeless tobacco: Never Used  Substance Use Topics  . Alcohol use: No  . Drug use: No     Allergies   Oxycodone; Tramadol; Lisinopril; and Losartan   Review of Systems Review of Systems  Psychiatric/Behavioral: Positive for suicidal ideas.  All other systems reviewed and are negative.    Physical Exam Updated Vital Signs BP 130/62 (BP Location: Left Arm)   Pulse 80   Temp (!) 97.5 F (36.4 C) (Oral)   Resp 19   SpO2 100%   Physical Exam Vitals signs and nursing note reviewed.  Constitutional:      Appearance: He is well-developed.     Comments: elderly  HENT:     Head: Normocephalic and atraumatic.  Eyes:     Conjunctiva/sclera: Conjunctivae normal.     Pupils: Pupils are equal, round, and reactive to light.  Neck:     Musculoskeletal: Normal range of motion.  Cardiovascular:     Rate and Rhythm: Normal rate and regular rhythm.     Heart sounds: Normal heart sounds.  Pulmonary:     Effort: Pulmonary effort is normal.     Breath sounds: Normal breath sounds.     Comments: 2L supplemental O2 in use Chest:     Comments: Dialysis catheter right chest wall, clean without signs of infection Abdominal:     General: Bowel sounds are normal.     Palpations: Abdomen is soft.     Tenderness: There is no abdominal tenderness. There is no left CVA tenderness or  rebound.  Musculoskeletal: Normal range of motion.     Comments: Bilateral AKA  Skin:    General: Skin is warm and dry.  Neurological:     Mental Status: He is alert and oriented to person, place, and time.      ED Treatments / Results  Labs (all labs ordered are listed, but only abnormal results are displayed) Labs Reviewed  COMPREHENSIVE METABOLIC PANEL - Abnormal; Notable for the following components:      Result Value   Chloride 96 (*)    Glucose, Bld 194 (*)    Creatinine, Ser 1.62 (*)    Calcium 7.4 (*)    Total Protein 6.1 (*)    Albumin 2.8 (*)    GFR calc non Af Amer 41 (*)    GFR calc Af Amer 48 (*)    All other components within normal limits  ACETAMINOPHEN LEVEL - Abnormal; Notable for the following components:   Acetaminophen (Tylenol), Serum <10 (*)    All other components within normal limits  CBC - Abnormal; Notable for the following components:   WBC 14.1 (*)    RBC 2.62 (*)    Hemoglobin 7.7 (*)    HCT 27.2 (*)    MCV 103.8 (*)    MCHC 28.3 (*)    RDW 19.5 (*)    All other components within normal limits  RAPID URINE DRUG SCREEN, HOSP PERFORMED - Abnormal; Notable for the following components:   Opiates POSITIVE (*)    Benzodiazepines POSITIVE (*)    Barbiturates POSITIVE (*)    All other components within normal limits  ETHANOL  SALICYLATE LEVEL  EKG None  Radiology Results for orders placed or performed during the hospital encounter of 01/17/18  Comprehensive metabolic panel  Result Value Ref Range   Sodium 135 135 - 145 mmol/L   Potassium 3.7 3.5 - 5.1 mmol/L   Chloride 96 (L) 98 - 111 mmol/L   CO2 28 22 - 32 mmol/L   Glucose, Bld 194 (H) 70 - 99 mg/dL   BUN 17 8 - 23 mg/dL   Creatinine, Ser 1.62 (H) 0.61 - 1.24 mg/dL   Calcium 7.4 (L) 8.9 - 10.3 mg/dL   Total Protein 6.1 (L) 6.5 - 8.1 g/dL   Albumin 2.8 (L) 3.5 - 5.0 g/dL   AST 20 15 - 41 U/L   ALT 21 0 - 44 U/L   Alkaline Phosphatase 103 38 - 126 U/L   Total Bilirubin 0.6 0.3  - 1.2 mg/dL   GFR calc non Af Amer 41 (L) >60 mL/min   GFR calc Af Amer 48 (L) >60 mL/min   Anion gap 11 5 - 15  Ethanol  Result Value Ref Range   Alcohol, Ethyl (B) <76 <19 mg/dL  Salicylate level  Result Value Ref Range   Salicylate Lvl <5.0 2.8 - 30.0 mg/dL  Acetaminophen level  Result Value Ref Range   Acetaminophen (Tylenol), Serum <10 (L) 10 - 30 ug/mL  cbc  Result Value Ref Range   WBC 14.1 (H) 4.0 - 10.5 K/uL   RBC 2.62 (L) 4.22 - 5.81 MIL/uL   Hemoglobin 7.7 (L) 13.0 - 17.0 g/dL   HCT 27.2 (L) 39.0 - 52.0 %   MCV 103.8 (H) 80.0 - 100.0 fL   MCH 29.4 26.0 - 34.0 pg   MCHC 28.3 (L) 30.0 - 36.0 g/dL   RDW 19.5 (H) 11.5 - 15.5 %   Platelets 392 150 - 400 K/uL   nRBC 0.0 0.0 - 0.2 %  Rapid urine drug screen (hospital performed)  Result Value Ref Range   Opiates POSITIVE (A) NONE DETECTED   Cocaine NONE DETECTED NONE DETECTED   Benzodiazepines POSITIVE (A) NONE DETECTED   Amphetamines NONE DETECTED NONE DETECTED   Tetrahydrocannabinol NONE DETECTED NONE DETECTED   Barbiturates POSITIVE (A) NONE DETECTED    Dg Chest 2 View  Result Date: 01/17/2018 CLINICAL DATA:  Dyspnea EXAM: CHEST - 2 VIEW COMPARISON:  01/15/2018 FINDINGS: Interval development of moderate layering right effusion obscuring portions of the right hemidiaphragm and right heart border. Perihilar vascular redistribution is noted. Small left effusion is seen. Dialysis catheter terminates in the proximal right atrial region. Chronic fracture deformity of the right humeral head and neck nonosseous union. IMPRESSION: Findings consistent with CHF with moderate right and small left pleural effusions. Interval reaccumulation of right-sided pleural fluid since prior. Electronically Signed   By: Ashley Royalty M.D.   On: 01/17/2018 23:31   Procedures Procedures (including critical care time)  Medications Ordered in ED Medications - No data to display   Initial Impression / Assessment and Plan / ED Course  I have  reviewed the triage vital signs and the nursing notes.  Pertinent labs & imaging results that were available during my care of the patient were reviewed by me and considered in my medical decision making (see chart for details).  72 year old male with multiple medical problems including end-stage renal disease on hemodialysis, presenting to the ED with suicidal ideation.  He is at [facility] and has been refusing all his medications and care.  States he does not want to  take them and just feels like "giving up".  He reportedly told the nurse here in the ER that he would get a gun to kill himself if he could.  Patient was recently discharged from the hospital after admission for hypervolemia.  He was dialyzed earlier today, full session.  Labs are overall reassuring given his dialysis status.  CXR with pleural effusion.  Patient is on his baseline 2L O2 without acute distress.  TTS has evaluated--they recommend Geri psych placement as he seems very depressed about his overall state of health.  Patient's home meds have been ordered.  Patient has been resting comfortably.  Given that patient is ESRD on HD, his next dialysis will be on 12/29/2017.  Psychiatry will round on patient in the morning.  If they still recommend geri-psych but no definitive placement can be find in a timely manner, will need to arrange for patient to have dialysis while awaiting placement.  Oncoming team made aware of situation, will follow-up with psych recommendations/placement.  Final Clinical Impressions(s) / ED Diagnoses   Final diagnoses:  Suicidal ideation    ED Discharge Orders    None       Larene Pickett, PA-C 01/18/18 0929    Maudie Flakes, MD 01/18/18 [phone]

## 2018-01-17 NOTE — Progress Notes (Signed)
[facility] ROUNDING NOTE   Subjective:   Patient seen on dialysis hoping to go home after treatment  Blood pressure 126/61 pulse 54 temperature 97.5 O2 sats 100% 3 L nasal cannula  Sodium 136 potassium 4.1 chloride 96 CO2 26 glucose 107 BUN 18 creatinine 2.15 calcium 7.5 albumin 2.3 phosphorus 3.4 iron saturation 17% WBC 12.1 hemoglobin 7.4 platelets 336   Objective:  Vital signs in last 24 hours:  Temp:  [97.5 F (36.4 C)-98.2 F (36.8 C)] 97.5 F (36.4 C) (12/24 0447) Pulse Rate:  [54-76] 54 (12/24 0447) Resp:  [18-20] 20 (12/24 0447) BP: (103-149)/(53-104) 129/61 (12/24 0447) SpO2:  [96 %-100 %] 100 % (12/24 0447) Weight:  [76.6 kg-78.1 kg] 78.1 kg (12/24 0500)  Weight change: 3.2 kg Filed Weights   01/16/18 0735 01/16/18 1203 01/17/18 0500  Weight: 80.4 kg 76.6 kg 78.1 kg    Intake/Output: I/O last 3 completed shifts: In: 0  Out: 2200 [Urine:200; Other:2000]   Intake/Output this shift:  No intake/output data recorded.  CVS- RRR no JVP RS- CTA slightly diminished breath sounds on right clear left ABD- BS present soft non-distended EXT-bilateral amputation BKA right BKA left   right forearm AV fistula right IJ [facility]   Basic Metabolic Panel: Recent Labs  Lab 01/14/18 1928 01/15/18 0750 01/16/18 0525 01/17/18 0424  NA 135 135 132* 136  K 3.9 4.0 4.2 4.1  CL 95* 94* 93* 96*  CO2 25 28 29 26   GLUCOSE 142* 249* 287* 107*  BUN 19 28* 37* 18  CREATININE 1.91* 2.45* 3.22* 2.15*  CALCIUM 7.6* 7.7* 7.5* 7.5*  PHOS  --  3.4  --  3.4    Liver Function Tests: Recent Labs  Lab 01/14/18 1928 01/15/18 0750 01/17/18 0424  AST 25 24  --   ALT 22 23  --   ALKPHOS 94 93  --   BILITOT 0.7 0.4  --   PROT 5.6* 5.4*  --   ALBUMIN 2.3* 2.2* 2.3*   Recent Labs  Lab 01/14/18 1928  LIPASE 17   No results for input(s): AMMONIA in the last 168 hours.  CBC: Recent Labs  Lab 01/14/18 1928 01/15/18 0752 01/16/18 0811 01/16/18 0944 01/17/18 0714   WBC 14.7* 9.6 QUESTIONABLE IDENTIFICATION / INCORRECTLY LABELED SPECIMEN 13.8* 12.1*  NEUTROABS 12.5*  --   --   --   --   HGB 7.6* 7.4* QUESTIONABLE IDENTIFICATION / INCORRECTLY LABELED SPECIMEN 8.0* 7.4*  HCT 27.0* 25.5* QUESTIONABLE IDENTIFICATION / INCORRECTLY LABELED SPECIMEN 27.4* 26.3*  MCV 99.6 99.2 QUESTIONABLE IDENTIFICATION / INCORRECTLY LABELED SPECIMEN 100.4* 101.2*  PLT 389 389 QUESTIONABLE IDENTIFICATION / INCORRECTLY LABELED SPECIMEN 393 336    Cardiac Enzymes: No results for input(s): CKTOTAL, CKMB, CKMBINDEX, TROPONINI in the last 168 hours.  BNP: Invalid input(s): POCBNP  CBG: Recent Labs  Lab 01/16/18 1120 01/16/18 1255 01/16/18 1611 01/16/18 2155 01/17/18 0745  GLUCAP 176* 168* 205* 3* 105*    Microbiology: Results for orders placed or performed during the hospital encounter of 01/14/18  MRSA PCR Screening     Status: None   Collection Time: 01/14/18 11:41 PM  Result Value Ref Range Status   MRSA by PCR NEGATIVE NEGATIVE Final    Comment:        The GeneXpert MRSA Assay (FDA approved for NASAL specimens only), is one component of a comprehensive MRSA colonization surveillance program. It is not intended to diagnose MRSA infection nor to guide or monitor treatment for MRSA infections. Performed at  [facility], [place] [address] [place], [place] [address]   Culture, body fluid-bottle     Status: None (Preliminary result)   Collection Time: 01/15/18 11:42 AM  Result Value Ref Range Status   Specimen Description PLEURAL RIGHT  Final   Special Requests NONE  Final   Culture   Final    NO GROWTH 2 DAYS Performed at [facility], [place] [address], [place], [place] [address]    Report Status PENDING  Incomplete  Gram stain     Status: None   Collection Time: 01/15/18 11:42 AM  Result Value Ref Range Status   Specimen Description PLEURAL RIGHT  Final   Special Requests NONE  Final   Gram Stain   Final    RARE WBC PRESENT,  PREDOMINANTLY PMN NO ORGANISMS SEEN Performed at [facility], [place] [address] [place], [place] [address]    Report Status 01/15/2018 FINAL  Final    Coagulation Studies: Recent Labs    01/14/18 1928  LABPROT 14.1  INR 1.10    Urinalysis: No results for input(s): COLORURINE, LABSPEC, PHURINE, GLUCOSEU, HGBUR, BILIRUBINUR, KETONESUR, PROTEINUR, UROBILINOGEN, NITRITE, LEUKOCYTESUR in the last 72 hours.  Invalid input(s): APPERANCEUR    Imaging: Dg Chest 1 View  Result Date: 01/15/2018 CLINICAL DATA:  Post thoracentesis EXAM: CHEST  1 VIEW COMPARISON:  01/14/2018 FINDINGS: RIGHT jugular dual-lumen central venous catheter with tip projecting over SVC. Enlargement of cardiac silhouette with pulmonary vascular congestion. Decreased RIGHT pleural effusion and basilar atelectasis since previous exam. No pneumothorax post thoracentesis. Bones demineralized with old posttraumatic deformity of the proximal RIGHT humerus. Atherosclerotic calcification aortic arch. IMPRESSION: Decreased RIGHT pleural effusion and basilar atelectasis post thoracentesis without pneumothorax. Enlargement of cardiac silhouette with pulmonary vascular congestion. Electronically Signed   By: Lavonia Dana M.D.   On: 01/15/2018 11:25   Dg Femur Min 2 Views Left  Result Date: 01/15/2018 CLINICAL DATA:  Distal femoral pain EXAM: LEFT FEMUR 2 VIEWS COMPARISON:  None FINDINGS: Prior above knee amputation. Osseous mineralization normal. No fracture, dislocation, or bone destruction. Skin clips at stump. Scattered vascular calcifications. IMPRESSION: Post above knee amputation. No acute abnormalities. Electronically Signed   By: Lavonia Dana M.D.   On: 01/15/2018 11:25   Ct Angio Abd/pel W/ And/or W/o  Result Date: 01/16/2018 CLINICAL DATA:  Acute onset of recurrent generalized abdominal pain. Patient on dialysis. Evaluate for bowel ischemia. EXAM: CTA ABDOMEN AND PELVIS wITHOUT AND WITH CONTRAST TECHNIQUE: Multidetector  CT imaging of the abdomen and pelvis was performed using the standard protocol during bolus administration of intravenous contrast. Multiplanar reconstructed images and MIPs were obtained and reviewed to evaluate the vascular anatomy. CONTRAST:  81mL ISOVUE-370 IOPAMIDOL (ISOVUE-370) INJECTION 76% COMPARISON:  CT of the abdomen and pelvis performed 01/14/2018 FINDINGS: VASCULAR Aorta: Scattered calcification is noted along the abdominal aorta, with mild associated mural thrombus but no significant luminal narrowing. Celiac: The celiac trunk appears grossly intact, with mild scattered calcification. SMA: The superior mesenteric artery remains intact, with scattered calcification along the superior mesenteric artery. Renals: The renal arteries appear intact bilaterally, though scattered calcification is noted at the proximal renal arteries bilaterally. IMA: There is diffuse narrowing along the proximal inferior mesenteric artery, though it appears patent more distally. Inflow: Scattered calcification is noted along the common, internal and external iliac arteries, with mild luminal narrowing along the internal iliac arteries. Proximal Outflow: The common femoral arteries demonstrate scattered calcification, but appear patent. Veins: Visualized venous structures are  unremarkable in appearance. Review of the MIP images confirms the above findings. NON-VASCULAR Lower chest: Small bilateral pleural effusions are noted. Bibasilar airspace opacification is noted, particularly at the right lung base, concerning for multifocal pneumonia. There is effacement of the right mainstem bronchus, thought to reflect underlying aspiration. Hepatobiliary: The liver is grossly unremarkable in appearance. Trace ascites noted tracking about the liver. Minimal soft tissue inflammation about the gallbladder is nonspecific in the presence of ascites. Cholelithiasis is noted. The common bile duct remains normal in caliber. Pancreas: The  pancreas is within normal limits. Spleen: The spleen is unremarkable in appearance. Trace fluid is noted tracking about the spleen. Adrenals/Urinary Tract: The adrenal glands are unremarkable in appearance. Mild to moderate bilateral renal atrophy is noted. Nonspecific perinephric stranding is noted bilaterally. There is no evidence of hydronephrosis. No renal or ureteral stones are identified. Stomach/Bowel: Mild scattered diverticulosis is noted along much of the colon, with sparing of the sigmoid colon. The small bowel is decompressed and grossly unremarkable. The stomach is within normal limits. Lymphatic: No retroperitoneal or pelvic sidewall lymphadenopathy is seen. Reproductive: The bladder is mildly distended and grossly unremarkable. The prostate remains normal in size, with scattered calcification. Other: Diffuse soft tissue edema along the abdominal wall may reflect anasarca. Musculoskeletal: No acute osseous abnormalities are identified. The visualized musculature is unremarkable in appearance. IMPRESSION: VASCULAR 1. No evidence for bowel ischemia. Diffuse narrowing along the proximal inferior mesenteric artery, though it appears patent distally. The superior mesenteric artery remains patent. 2. Scattered calcification along the abdominal aorta and its branches, with mild mural thrombus but no significant luminal narrowing at the abdominal aorta. NON-VASCULAR 1. Small bilateral pleural effusions. Bibasilar airspace opacification, right greater than left, concerning for multifocal pneumonia. Effacement of the right mainstem bronchus is suspicious for aspiration. 2. Trace ascites noted tracking about the liver. Minimal soft tissue inflammation about the gallbladder is nonspecific in the presence of ascites. Cholelithiasis noted. 3. Mild to moderate bilateral renal atrophy. 4. Mild scattered diverticulosis along much of the colon, with sparing of the sigmoid colon. 5. Diffuse soft tissue edema along the  abdominal wall may reflect anasarca. Aortic Atherosclerosis (ICD10-I70.0). Electronically Signed   By: Garald Balding M.D.   On: 01/16/2018 01:47   US Thoracentesis Asp Pleural Space W/img Guide  Result Date: 01/15/2018 INDICATION: Patient with history of heart failure, ESRD, volume overload, dyspnea, and right pleural effusion. Request is made for diagnostic and therapeutic right thoracentesis. EXAM: ULTRASOUND GUIDED DIAGNOSTIC AND THERAPEUTIC RIGHT THORACENTESIS MEDICATIONS: 10 mL of 1% lidocaine COMPLICATIONS: None immediate. PROCEDURE: An ultrasound guided thoracentesis was thoroughly discussed with the patient and questions answered. The benefits, risks, alternatives and complications were also discussed. The patient understands and wishes to proceed with the procedure. Written consent was obtained. Ultrasound was performed to localize and mark an adequate pocket of fluid in the right chest. The area was then prepped and draped in the normal sterile fashion. 1% Lidocaine was used for local anesthesia. Under ultrasound guidance a 6 Fr Safe-T-Centesis catheter was introduced. Thoracentesis was performed. The catheter was removed and a dressing applied. FINDINGS: A total of approximately 2 L of clear yellow fluid was removed. Samples were sent to the laboratory as requested by the clinical team. IMPRESSION: Successful ultrasound guided right thoracentesis yielding 2 L of pleural fluid. Read by: Earley Abide, PA-C Electronically Signed   By: Aletta Edouard M.D.   On: 01/15/2018 11:36     Medications:   . sodium chloride    .  sodium chloride    . sodium chloride     . acetaminophen  1,000 mg Oral TID  . aspirin EC  81 mg Oral Daily  . atorvastatin  20 mg Oral q1800  . busPIRone  2.5 mg Oral BID  . carvedilol  3.125 mg Oral BID WC  . Chlorhexidine Gluconate Cloth  6 each Topical Q0600  . Chlorhexidine Gluconate Cloth  6 each Topical Q0600  . cinacalcet  30 mg Oral Daily  . clopidogrel  75  mg Oral Daily  . darbepoetin (ARANESP) injection - DIALYSIS  200 mcg Intravenous Q Mon-HD  . gabapentin  300 mg Oral QHS  . heparin  5,000 Units Subcutaneous Q8H  . insulin aspart  0-5 Units Subcutaneous QHS  . insulin aspart  0-9 Units Subcutaneous TID WC  . insulin detemir  5 Units Subcutaneous QHS  . isosorbide-hydrALAZINE  1 tablet Oral BID  . [START ON 01/06/2018] midodrine  10 mg Oral Q T,Th,Sa-HD  . pantoprazole  40 mg Oral Daily  . primidone  100 mg Oral Daily  . sevelamer carbonate  1,600 mg Oral TID WC  . sodium chloride flush  3 mL Intravenous Q12H   sodium chloride, sodium chloride, sodium chloride, ALPRAZolam, alteplase, diazepam, heparin, lidocaine (PF), lidocaine-prilocaine, Melatonin, ondansetron (ZOFRAN) IV, oxyCODONE, pentafluoroprop-tetrafluoroeth, sodium chloride flush  Assessment/ Plan:   ESRD Tuesday Thursday Saturday dialysis via unit [facility] started April 2018 on holiday schedule dialysis completed 01/16/2018 additional dialysis treatment plan 01/17/2018  Anemia we will dose with darbepoetin 200 mcg 01/16/2018 had no blood transfusions we will check iron studies.  Hemoglobin dropped 7.4 from 8.0  Left adrenal adenoma stable on CT scan  Systolic heart failure ejection fraction 25 to 30% mild aortic stenosis  Coronary artery disease last cardiac catheterization 9/19 PCA with DES distal RCA  Diabetes mellitus per primary care team  Hypertension/volume will challenge if possible some chronic low blood pressures using midodrine  Peripheral vascular disease with bilateral below-knee amputation.  Secondary hyperparathyroidism continue cinacalcet 30 mg and binders sevelamer 800 mg 2 with meals  Consider goals of care while inpatient will defer to primary team   LOS: [place] @TODAY @9 :10 AM

## 2018-01-18 DIAGNOSIS — I10 Essential (primary) hypertension: Secondary | ICD-10-CM | POA: Diagnosis not present

## 2018-01-18 DIAGNOSIS — R609 Edema, unspecified: Secondary | ICD-10-CM | POA: Diagnosis not present

## 2018-01-18 DIAGNOSIS — I959 Hypotension, unspecified: Secondary | ICD-10-CM | POA: Diagnosis not present

## 2018-01-18 DIAGNOSIS — Z7401 Bed confinement status: Secondary | ICD-10-CM | POA: Diagnosis not present

## 2018-01-18 DIAGNOSIS — F4321 Adjustment disorder with depressed mood: Secondary | ICD-10-CM | POA: Diagnosis present

## 2018-01-18 DIAGNOSIS — M2550 Pain in unspecified joint: Secondary | ICD-10-CM | POA: Diagnosis not present

## 2018-01-18 LAB — SALICYLATE LEVEL: Salicylate Lvl: <7 mg/dL (ref 2.8–30.0)

## 2018-01-18 LAB — COMPREHENSIVE METABOLIC PANEL
ALT: 21 U/L (ref 0–44)
AST: 20 U/L (ref 15–41)
Albumin: 2.8 g/dL — ABNORMAL LOW (ref 3.5–5.0)
Alkaline Phosphatase: 103 U/L (ref 38–126)
Anion gap: 11 (ref 5–15)
BILIRUBIN TOTAL: 0.6 mg/dL (ref 0.3–1.2)
BUN: 17 mg/dL (ref 8–23)
CO2: 28 mmol/L (ref 22–32)
Calcium: 7.4 mg/dL — ABNORMAL LOW (ref 8.9–10.3)
Chloride: 96 mmol/L — ABNORMAL LOW (ref 98–111)
Creatinine, Ser: 1.62 mg/dL — ABNORMAL HIGH (ref 0.61–1.24)
GFR calc Af Amer: 48 mL/min — ABNORMAL LOW (ref >60)
GFR calc non Af Amer: 41 mL/min — ABNORMAL LOW (ref >60)
Glucose, Bld: 194 mg/dL — ABNORMAL HIGH (ref 70–99)
Potassium: 3.7 mmol/L (ref 3.5–5.1)
Sodium: 135 mmol/L (ref 135–145)
Total Protein: 6.1 g/dL — ABNORMAL LOW (ref 6.5–8.1)

## 2018-01-18 LAB — RAPID URINE DRUG SCREEN, HOSP PERFORMED
Amphetamines: NOT DETECTED
Barbiturates: POSITIVE — AB
Benzodiazepines: POSITIVE — AB
Cocaine: NOT DETECTED
Opiates: POSITIVE — AB
TETRAHYDROCANNABINOL: NOT DETECTED

## 2018-01-18 LAB — CBG MONITORING, ED: Glucose-Capillary: 151 mg/dL — ABNORMAL HIGH (ref 70–99)

## 2018-01-18 LAB — ACETAMINOPHEN LEVEL: Acetaminophen (Tylenol), Serum: <10 ug/mL — ABNORMAL LOW (ref 10–30)

## 2018-01-18 LAB — ETHANOL: Alcohol, Ethyl (B): <10 mg/dL (ref <10)

## 2018-01-18 MED ORDER — PRIMIDONE 50 MG PO TABS
100.0000 mg | ORAL_TABLET | Freq: Every day | ORAL | Status: DC
  Administered 2018-01-18: 100 mg via ORAL
  Filled 2018-01-18: qty 2

## 2018-01-18 MED ORDER — KETOCONAZOLE 2 % EX CREA: 1.0000 "application " | TOPICAL_CREAM | Freq: Two times a day (BID) | CUTANEOUS | Status: DC | PRN

## 2018-01-18 MED ORDER — CYANOCOBALAMIN 500 MCG PO TABS
1000.0000 ug | ORAL_TABLET | Freq: Every day | ORAL | Status: DC
  Administered 2018-01-18: 1000 ug via ORAL
  Filled 2018-01-18: qty 2

## 2018-01-18 MED ORDER — VITAMIN D 25 MCG (1000 UNIT) PO TABS
2000.0000 [IU] | ORAL_TABLET | Freq: Every day | ORAL | Status: DC
  Administered 2018-01-18: 2000 [IU] via ORAL
  Filled 2018-01-18: qty 2

## 2018-01-18 MED ORDER — ALPRAZOLAM 0.5 MG PO TABS: 0.5000 mg | ORAL_TABLET | Freq: Every evening | ORAL | Status: DC | PRN

## 2018-01-18 MED ORDER — GABAPENTIN 100 MG PO CAPS: 100.0000 mg | ORAL_CAPSULE | Freq: Every day | ORAL | Status: DC

## 2018-01-18 MED ORDER — ATORVASTATIN CALCIUM 20 MG PO TABS: 20.0000 mg | ORAL_TABLET | Freq: Every day | ORAL | Status: DC

## 2018-01-18 MED ORDER — CITALOPRAM HYDROBROMIDE 10 MG PO TABS: 10.0000 mg | ORAL_TABLET | Freq: Every day | ORAL | Status: DC

## 2018-01-18 MED ORDER — DIAZEPAM 5 MG PO TABS: 2.5000 mg | ORAL_TABLET | Freq: Three times a day (TID) | ORAL | Status: DC | PRN

## 2018-01-18 MED ORDER — KETOROLAC TROMETHAMINE 0.5 % OP SOLN: 1.0000 [drp] | Freq: Four times a day (QID) | OPHTHALMIC | Status: DC | PRN

## 2018-01-18 MED ORDER — ASPIRIN EC 81 MG PO TBEC
81.0000 mg | DELAYED_RELEASE_TABLET | Freq: Every day | ORAL | Status: DC
  Administered 2018-01-18: 81 mg via ORAL
  Filled 2018-01-18: qty 1

## 2018-01-18 MED ORDER — DOCUSATE SODIUM 100 MG PO CAPS
100.0000 mg | ORAL_CAPSULE | Freq: Two times a day (BID) | ORAL | Status: DC
  Filled 2018-01-18: qty 1

## 2018-01-18 MED ORDER — BUSPIRONE HCL 5 MG PO TABS
2.5000 mg | ORAL_TABLET | Freq: Two times a day (BID) | ORAL | Status: DC
  Filled 2018-01-18: qty 0.5

## 2018-01-18 MED ORDER — DOCUSATE SODIUM 100 MG PO CAPS
100.0000 mg | ORAL_CAPSULE | Freq: Two times a day (BID) | ORAL | Status: DC
  Administered 2018-01-18: 100 mg via ORAL
  Filled 2018-01-18: qty 1

## 2018-01-18 MED ORDER — ACETAMINOPHEN 325 MG PO TABS
650.0000 mg | ORAL_TABLET | ORAL | Status: DC | PRN
  Administered 2018-01-18: 650 mg via ORAL
  Filled 2018-01-18: qty 2

## 2018-01-18 MED ORDER — ONDANSETRON HCL 4 MG PO TABS: 4.0000 mg | ORAL_TABLET | Freq: Three times a day (TID) | ORAL | Status: DC | PRN

## 2018-01-18 MED ORDER — PANTOPRAZOLE SODIUM 40 MG PO TBEC
40.0000 mg | DELAYED_RELEASE_TABLET | Freq: Every day | ORAL | Status: DC
  Administered 2018-01-18: 40 mg via ORAL
  Filled 2018-01-18: qty 1

## 2018-01-18 MED ORDER — CLOPIDOGREL BISULFATE 75 MG PO TABS
75.0000 mg | ORAL_TABLET | Freq: Every day | ORAL | Status: DC
  Administered 2018-01-18: 75 mg via ORAL
  Filled 2018-01-18: qty 1

## 2018-01-18 MED ORDER — CLONAZEPAM 0.5 MG PO TABS
0.5000 mg | ORAL_TABLET | Freq: Two times a day (BID) | ORAL | Status: DC
  Administered 2018-01-18: 0.5 mg via ORAL
  Filled 2018-01-18: qty 1

## 2018-01-18 MED ORDER — BUSPIRONE HCL 5 MG PO TABS
2.5000 mg | ORAL_TABLET | Freq: Two times a day (BID) | ORAL | Status: DC
  Administered 2018-01-18: 2.5 mg via ORAL
  Filled 2018-01-18: qty 0.5

## 2018-01-18 MED ORDER — NEPRO/CARBSTEADY PO LIQD
237.0000 mL | Freq: Three times a day (TID) | ORAL | Status: DC
  Administered 2018-01-18: 237 mL via ORAL
  Filled 2018-01-18 (×2): qty 237

## 2018-01-18 MED ORDER — INSULIN ASPART 100 UNIT/ML ~~LOC~~ SOLN
0.0000 [IU] | Freq: Three times a day (TID) | SUBCUTANEOUS | Status: DC
  Administered 2018-01-18: 2 [IU] via SUBCUTANEOUS
  Filled 2018-01-18: qty 1

## 2018-01-18 MED ORDER — CALCIUM CARBONATE ANTACID 500 MG PO CHEW: 1.0000 | CHEWABLE_TABLET | ORAL | Status: DC | PRN

## 2018-01-18 MED ORDER — MELATONIN 3 MG PO TABS
3.0000 mg | ORAL_TABLET | Freq: Every evening | ORAL | Status: DC | PRN
  Filled 2018-01-18: qty 1

## 2018-01-18 MED ORDER — CINACALCET HCL 30 MG PO TABS
30.0000 mg | ORAL_TABLET | Freq: Every day | ORAL | Status: DC
  Administered 2018-01-18: 30 mg via ORAL
  Filled 2018-01-18: qty 1

## 2018-01-18 MED ORDER — RISAQUAD PO CAPS
2.0000 | ORAL_CAPSULE | Freq: Every day | ORAL | Status: DC
  Administered 2018-01-18: 2 via ORAL
  Filled 2018-01-18: qty 2

## 2018-01-18 MED ORDER — INSULIN DETEMIR 100 UNIT/ML ~~LOC~~ SOLN: 5.0000 [IU] | Freq: Every day | SUBCUTANEOUS | Status: DC

## 2018-01-18 MED ORDER — CARVEDILOL 3.125 MG PO TABS
3.1250 mg | ORAL_TABLET | Freq: Two times a day (BID) | ORAL | Status: DC
  Filled 2018-01-18: qty 1

## 2018-01-18 MED ORDER — SEVELAMER CARBONATE 800 MG PO TABS
800.0000 mg | ORAL_TABLET | Freq: Three times a day (TID) | ORAL | Status: DC
  Administered 2018-01-18: 800 mg via ORAL
  Filled 2018-01-18 (×2): qty 1

## 2018-01-18 MED ORDER — OXYCODONE HCL 5 MG PO TABS
5.0000 mg | ORAL_TABLET | ORAL | Status: DC
  Administered 2018-01-18: 5 mg via ORAL
  Filled 2018-01-18: qty 1

## 2018-01-18 MED ORDER — CITALOPRAM HYDROBROMIDE 10 MG PO TABS: 10.0000 mg | ORAL_TABLET | Freq: Every day | ORAL | 0 refills | Status: AC

## 2018-01-18 NOTE — [facility] Suicide Risk Assessment (Signed)
Suicide Risk Assessment  Discharge Assessment   [facility] Discharge Suicide Risk Assessment   Principal Problem: Adjustment disorder with depressed mood Discharge Diagnoses: Principal Problem:   Adjustment disorder with depressed mood   Total Time spent with patient: 45 minutes  Musculoskeletal: Strength & Muscle Tone: within normal limits Gait & Station: normal Patient leans: N/A  Psychiatric Specialty Exam:   Blood pressure (!) 103/48, pulse 82, temperature (!) 97.5 F (36.4 C), temperature source Oral, resp. rate (!) 24, SpO2 98 %.There is no height or weight on file to calculate BMI.  General Appearance: Casual  Eye Contact::  Good  Speech:  Normal Rate409  Volume:  Normal  Mood:  Depressed, mild  Affect:  Congruent  Thought Process:  Coherent and Descriptions of Associations: Intact  Orientation:  Full (Time, Place, and Person)  Thought Content:  WDL and Logical  Suicidal Thoughts:  No  Homicidal Thoughts:  No  Memory:  Immediate;   Fair Recent;   Fair Remote;   Fair  Judgement:  Good  Insight:  Good  Psychomotor Activity:  Decreased  Concentration:  Fair  Recall:  [place] of Knowledge:Good  Language: Good  Akathisia:  No  Handed:  Right  AIMS (if indicated):     Assets:  Housing Leisure Time Resilience Social Support  Sleep:     Cognition: WNL  ADL's:  Impaired   Mental Status Per Nursing Assessment::   On Admission:   72 yo male who presented to the ED with suicidal ideations, second leg amputated two weeks ago and is on dialysis.  Upset prior to admission and stated he would shoot himself if he had a gun.  Lives in a nursing facility.  Today, he wants to get back to his facility as he does not want to loose his place there.  Denies suicidal/homicidal ideations, hallucinations, and substance abuse.  Stable for discharge to his facility  Demographic Factors:  Male, Age 51 or older and Caucasian  Loss Factors: Decline in physical health  Historical  Factors: NA  Risk Reduction Factors:   Sense of responsibility to family, Positive social support and Positive therapeutic relationship  Continued Clinical Symptoms:  Depression, mild  Cognitive Features That Contribute To Risk:  None    Suicide Risk:  Minimal: No identifiable suicidal ideation.  Patients presenting with no risk factors but with morbid ruminations; may be classified as minimal risk based on the severity of the depressive symptoms    Plan Of Care/Follow-up recommendations:  Activity:  as tolerated Diet:  heart healthy diet  Kajol Crispen, NP 01/18/2018, 10:30 AM

## 2018-01-18 NOTE — ED Notes (Signed)
Patient transferred to hospital bed, rotated onto right side to keep pressure off of backside.

## 2018-01-18 NOTE — BH Assessment (Addendum)
Tele Assessment Note   Patient Name: Mitchell Garcia MRN: [id number] Referring Physician: Quincy Carnes, PA. Location of Patient: [place], [place] [facility] Location of Provider: [place] is an 72 y.o. male, who presents voluntary and unaccompanied to [facility]. During the assessment the pt, would not answer many questions definitively. Clinician asked the pt, "what brought you to the hospital?" Pt reported, "I need help, mental help." Pt reported, "everything is clogging up on me, it started with dialysis." Pt would not expound. Per RN, pt reported, he was suicidal and he would do it with a gun and denies access to guns. Pt's RN reported, the pt understood her questions. Pt reported, it was a miss understanding with the nurse. Clinician asked the pt if he was suicidal. Pt reported, "it's stop and go." Pt would not give a definitive answer. Per RN, pt's other leg was amputated, three weeks ago. Pt reported no access to weapons while at [facility] but having a pistol at home. Pt denies, HI.    Clinician was unable to assess the following: OPT resources AVH, sleep, appetite, ADLs, substance use, self-injurious behaviors, vegetative symptoms family history of suicide, previous inpatient admissions.  Pt presents alert in scrubs with loud speech (pt is hard of hearing). Pt's eye contact was good. Pt's mood/affect are depressed. Pt's thought process was circumstantial. Pt's judgement was impaired. Pt was oriented x3. Pt's concentration and impulse control are fair.   Diagnosis: Major Depressive Disorder, recurrent, severe.  Past Medical History:  Past Medical History:  Diagnosis Date  . Acute kidney injury superimposed on chronic kidney disease ([place]) 10/15/2016  . Altered mental state 01/25/2013  . Anemia of chronic disease 01/25/2013  . CAD S/P percutaneous coronary angioplasty    a.not to be a candidate for CABG. On 9/19, went back to cath lab for PCI s/p  orbital atherectomy with DES to ostial RCA, and PTCA/DES to distal RCA. Residual 50% prox LAD, 70% D1, CTO mLCx noted by initial diagnostic cath 10/08/16 to be treated medically.  . Cancer ([place])   . Cellulitis 07/2017   right leg  . Cellulitis of right lower extremity 07/25/2017  . Chronic combined systolic and diastolic CHF (congestive heart failure) ([place])   . CKD (chronic kidney disease), stage IV ([place])   . Diabetes mellitus with renal complications (HCC)    uncontrolled  . Essential hypertension 10/15/2016  . Humerus head fracture 01/27/2013  . Hyperlipidemia   . Hyponatremia 01/25/2013  . Insulin dependent diabetes mellitus ([place])   . Ischemic cardiomyopathy 10/15/2016  . Left adrenal mass ([place]) 12/05/2017  . MI (myocardial infarction) ([place])    years ago  . Stroke ([place])   . Transaminitis 01/25/2013  . Uncontrolled diabetes mellitus ([place]) 10/15/2016    Past Surgical History:  Procedure Laterality Date  . COLONOSCOPY WITH PROPOFOL N/A 08/09/2017   Procedure: COLONOSCOPY WITH PROPOFOL Hemostatic Clips;  Surgeon: Clarene Essex, MD;  Location: [place];  Service: Endoscopy;  Laterality: N/A;  . CORONARY STENT INTERVENTION N/A 10/13/2016   Procedure: CORONARY STENT INTERVENTION;  Surgeon: Burnell Blanks, MD;  Location: [place] CV LAB;  Service: Cardiovascular;  Laterality: N/A;  . ESOPHAGOGASTRODUODENOSCOPY (EGD) WITH PROPOFOL N/A 08/08/2017   Procedure: ESOPHAGOGASTRODUODENOSCOPY (EGD) WITH PROPOFOL;  Surgeon: Clarene Essex, MD;  Location: [place];  Service: Endoscopy;  Laterality: N/A;  . ESOPHAGOGASTRODUODENOSCOPY (EGD) WITH PROPOFOL N/A 12/07/2017   Procedure: ESOPHAGOGASTRODUODENOSCOPY (EGD) WITH PROPOFOL;  Surgeon: Clarene Essex, MD;  Location: [facility]  ENDOSCOPY;  Service: Endoscopy;  Laterality: N/A;  . IR FLUORO GUIDE CV LINE RIGHT  05/19/2017  . IR FLUORO GUIDE CV LINE RIGHT  12/14/2017  . IR US GUIDE VASC ACCESS RIGHT  05/19/2017  . LEFT HEART CATH AND CORONARY ANGIOGRAPHY N/A  10/08/2016   Procedure: LEFT HEART CATH AND CORONARY ANGIOGRAPHY;  Surgeon: Nelva Bush, MD;  Location: [place] CV LAB;  Service: Cardiovascular;  Laterality: N/A;  . SCHLEROTHERAPY  08/09/2017   Procedure: [place]ward Ku OF AVM;  Surgeon: Clarene Essex, MD;  Location: [place];  Service: Endoscopy;;  . SKIN CANCER EXCISION    . TEMPORARY PACEMAKER N/A 10/13/2016   Procedure: Temporary Pacemaker;  Surgeon: Burnell Blanks, MD;  Location: [place] CV LAB;  Service: Cardiovascular;  Laterality: N/A;    Family History:  Family History  Problem Relation Age of Onset  . Hyperlipidemia Father     Social History:  reports that he has never smoked. He has never used smokeless tobacco. He reports that he does not drink alcohol or use drugs.  Additional Social History:  Alcohol / Drug Use Pain Medications: See MAR  Prescriptions: See MAR  Over the Counter: See MAR History of alcohol / drug use?: (UDS is pending. )  CIWA: CIWA-Ar BP: 132/73 Pulse Rate: 86 COWS:    Allergies:  Allergies  Allergen Reactions  . Oxycodone Other (See Comments)    Renders the patient DIFFICULT TO WAKE UP  . Tramadol Nausea And Vomiting  . Lisinopril Other (See Comments)    Hyperkalemia   . Losartan Other (See Comments)    Hyperkalemia     Home Medications: (Not in a hospital admission)   OB/GYN Status:  No LMP for male patient.  General Assessment Data Location of Assessment: [facility] TTS Assessment: In system Is this a Tele or Face-to-Face Assessment?: Tele Assessment Is this an Initial Assessment or a Re-assessment for this encounter?: Initial Assessment Patient Accompanied by:: N/A Language Other than English: No Living Arrangements: In Assisted Living/Nursing Home (Comment: Name of Nursing Home What gender do you identify as?: Male Marital status: Married Living Arrangements: Other (Comment)([facility]. ) Can pt return to current living arrangement?:  Yes Admission Status: Voluntary Is patient capable of signing voluntary admission?: Yes Referral Source: Self/Family/Friend Insurance type: [employer].      Crisis Care Plan Living Arrangements: Other (Comment)([facility]. ) Legal Guardian: Other:(Self. ) Name of Psychiatrist: [place] Name of Therapist: [facility]  Education Status Is patient currently in school?: No Is the patient employed, unemployed or receiving disability?: Receiving disability income  Risk to self with the past 6 months Suicidal Ideation: Yes-Currently Present Has patient been a risk to self within the past 6 months prior to admission? : Yes Suicidal Intent: Yes-Currently Present Has patient had any suicidal intent within the past 6 months prior to admission? : Yes Is patient at risk for suicide?: Yes Suicidal Plan?: Yes-Currently Present Has patient had any suicidal plan within the past 6 months prior to admission? : Yes Specify Current Suicidal Plan: Pt reported, to RN with a gun.  Access to Means: Yes Specify Access to Suicidal Means: Pt has access to a pistol at his home.  What has been your use of drugs/alcohol within the last 12 months?: UDS is pending. Previous Attempts/Gestures: No How many times?: 0 Other Self Harm Risks: NA Triggers for Past Attempts: None known Intentional Self Injurious Behavior: None Family Suicide History: Unable to assess Recent stressful life event(s): Other (Comment)("down  in the dumps," lost both legs.) Persecutory voices/beliefs?: No Depression: Yes Depression Symptoms: Feeling worthless/self pity, Loss of interest in usual pleasures, Guilt, Isolating, Fatigue, Despondent Substance abuse history and/or treatment for substance abuse?: No Suicide prevention information given to non-admitted patients: Not applicable  Risk to Others within the past 6 months Homicidal Ideation: No(Pt denies. ) Does patient have any lifetime risk of violence toward others beyond  the six months prior to admission? : No Thoughts of Harm to Others: No Current Homicidal Intent: No(Pt denies. ) Current Homicidal Plan: No Access to Homicidal Means: No Identified Victim: NA History of harm to others?: No Assessment of Violence: None Noted Violent Behavior Description: NA Does patient have access to weapons?: Yes (Comment)(pistol at home. ) Criminal Charges Pending?: No Does patient have a court date: No Is patient on probation?: No  Psychosis Hallucinations: (UTA) Delusions: (UTA)  Mental Status Report Appearance/Hygiene: In scrubs Eye Contact: Good Motor Activity: Unremarkable Speech: Loud(Pt is hard of hearing. ) Level of Consciousness: Alert Mood: Depressed Affect: Depressed Anxiety Level: Minimal Thought Processes: Circumstantial Judgement: Impaired Orientation: Person, Place, Time Obsessive Compulsive Thoughts/Behaviors: None  Cognitive Functioning Concentration: Fair Memory: Recent Impaired Is patient IDD: No Insight: Poor Impulse Control: Fair Appetite: (UTA) Sleep: Unable to Assess Vegetative Symptoms: Unable to Assess  ADLScreening [facility] Assessment Services) Patient's cognitive ability adequate to safely complete daily activities?: Yes Patient able to express need for assistance with ADLs?: Yes Independently performs ADLs?: No  Prior Inpatient Therapy Prior Inpatient Therapy: (UTA)  Prior Outpatient Therapy Prior Outpatient Therapy: (UTA)  ADL Screening (condition at time of admission) Patient's cognitive ability adequate to safely complete daily activities?: Yes Is the patient deaf or have difficulty hearing?: Yes Does the patient have difficulty seeing, even when wearing glasses/contacts?: Yes(Pt wears glasses. ) Does the patient have difficulty concentrating, remembering, or making decisions?: Yes Patient able to express need for assistance with ADLs?: Yes Does the patient have difficulty dressing or bathing?: Yes Independently  performs ADLs?: No Communication: Independent Dressing (OT): Needs assistance Is this a change from baseline?: Pre-admission baseline Grooming: (UTA) Feeding: Independent Bathing: (UTA) Toileting: (UTA) In/Out Bed: Needs assistance Is this a change from baseline?: Pre-admission baseline Walks in Home: Needs assistance(Pt uses manuel wheelchair.) Is this a change from baseline?: Pre-admission baseline       Abuse/Neglect Assessment (Assessment to be complete while patient is alone) Abuse/Neglect Assessment Can Be Completed: Unable to assess, patient is non-responsive or altered mental status     Advance Directives (For Healthcare) Does Patient Have a Medical Advance Directive?: No Would patient like information on creating a medical advance directive?: No - Patient declined          Disposition: Patriciaann Clan, PA recommends the pt mets geropsychiatric treatment. Disposition discussed with Lattie Haw, [place] and [place], Therapist, sports. TTS to seek placement.    Disposition Initial Assessment Completed for this Encounter: Yes  This service was provided via telemedicine using a 2-way, interactive audio and video technology.  Names of all persons participating in this telemedicine service and their role in this encounter. Name: Cordarious Zeek Role: Patient.  Name: Vertell Novak, MS, LPC, CRC. Role: Counselor.           Vertell Novak 01/18/2018 1:37 AM   Vertell Novak, MS, [facility], [facility] Triage Specialist [phone]

## 2018-01-18 NOTE — ED Notes (Signed)
[facility] has transported this patient.

## 2018-01-18 NOTE — ED Notes (Signed)
Patient stated to this writer that he was not really suicidal, that he was just miserable. Primary RN, [place] in room and stated to patient"You told me when you first got here you wanted to shoot yourself." Patient denied saying that and states I didn't understand what you asked me. Patient is HOH.

## 2018-01-18 NOTE — Progress Notes (Signed)
CSW aware patient has been medically and psychiatrically cleared for discharge. CSW aware patient is from [facility]. CSW spoke with Evelena Peat, patient's nurse at [facility], who reports patient can return to facility today 12/25.   Please call report to [phone]. [facility] to be called for transportation.    Ollen Barges, [place] Work Department  [employer]  [phone]

## 2018-01-18 NOTE — BH Assessment (Signed)
[facility] Assessment Progress Note  Per Buford Dresser, DO, this pt does not require psychiatric hospitalization at this time.  Pt is to be discharged from [facility] to return to his residential facility.  No further discharge instructions are required.  Ollen Barges, CSW agrees to facilitate pt's return.  Pt's nurse has been notified.  Jalene Mullet, [place] Triage Specialist [phone]

## 2018-01-20 LAB — CULTURE, BODY FLUID W GRAM STAIN -BOTTLE: Culture: NO GROWTH

## 2018-01-25 DEATH — deceased

## 2018-02-01 ENCOUNTER — Telehealth (HOSPITAL_COMMUNITY): Payer: Self-pay | Admitting: *Deleted

## 2018-12-28 IMAGING — CT CT EXTREM LOW W/O CM*R*
3 of 5 series · 10 of 33 positions shown, 12 images · non-contrast
Comparison: None.

CLINICAL DATA: Lower extremity cellulitis.  Penetrating trauma

EXAM:
CT OF THE LOWER RIGHT EXTREMITY WITHOUT CONTRAST
TECHNIQUE: Multidetector CT imaging of the right lower extremity was performed.
Today's exam from the iliac crest through the toes would typically
require at least 3 separate CT examinations as there are multiple
regions encompassed, each with their own procedure code. There is no
single defined CPT code aside from a CT angiogram runoff that
includes the whole pelvis, thigh, leg, and foot. As a courtesy this
single time, we are performing and reporting this as one CT scan. In
the future, individual regions of the extremity need their own
order, such as thigh, tibia/fibula, and foot. Reconstructions in
this case are not ideal for assessment of individual structures such
as joints, this is a large field survey and not a detailed
assessment of any of the included joints. The knee is moderately
flexed during imaging, resulting in non ideal axial images
particularly through the calf.

[Series 4: lfov ext 3.0 b40s · axial · 0.86mm/px · z∈[+65,+836]mm · 5 of 373 slices shown, 7 images]
[im 58/373  soft-tissue]
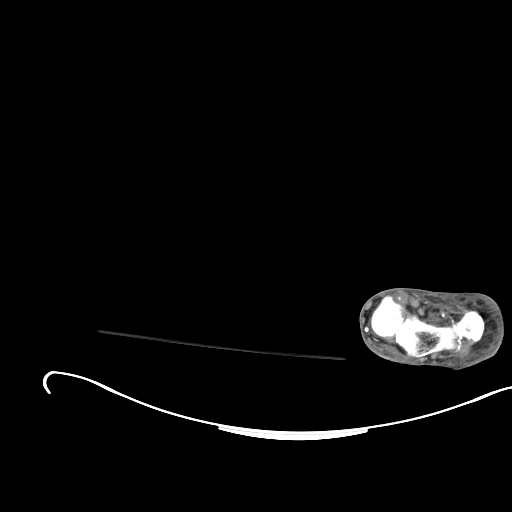
[im 58/373  bone]
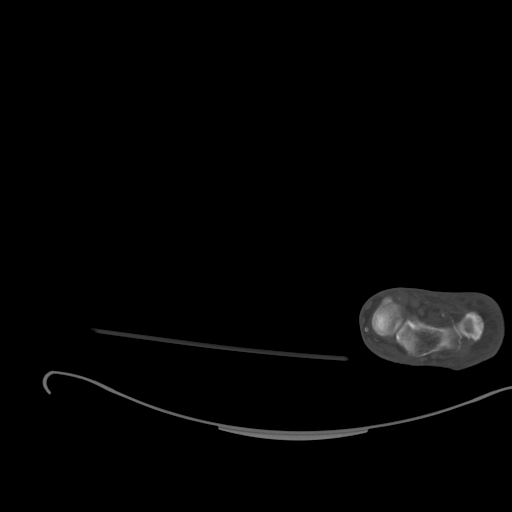
[im 115/373  bone]
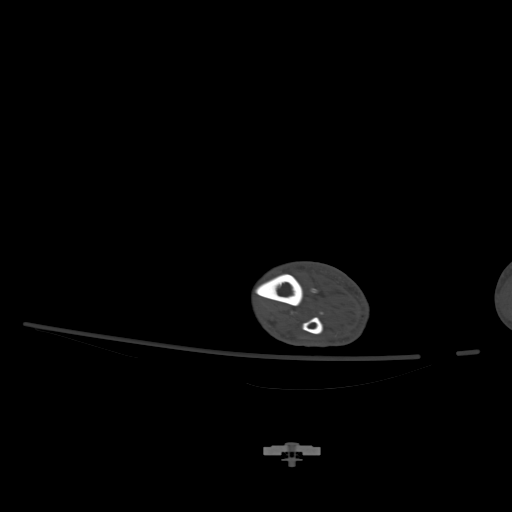
[im 201/373  bone]
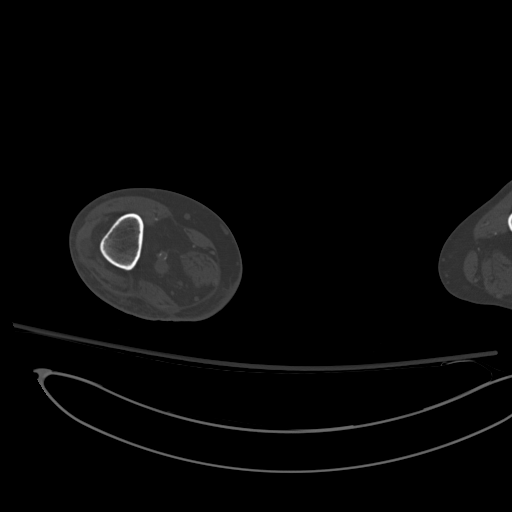
[im 258/373  bone]
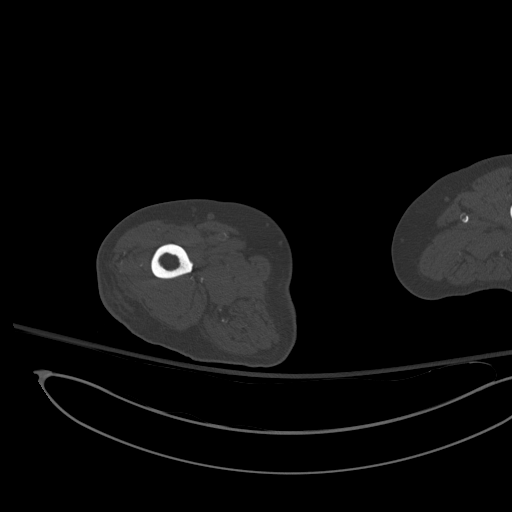
[im 315/373  soft-tissue]
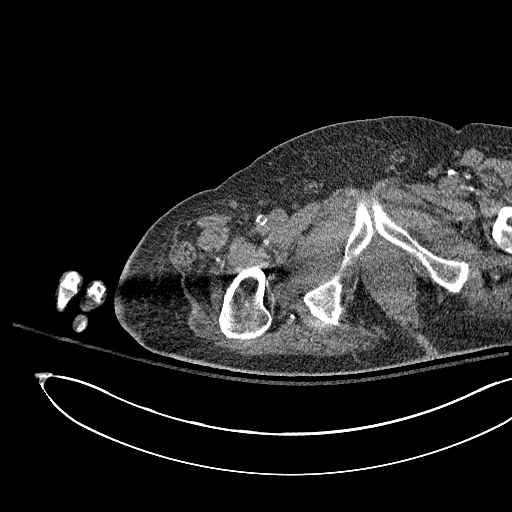
[im 315/373  bone]
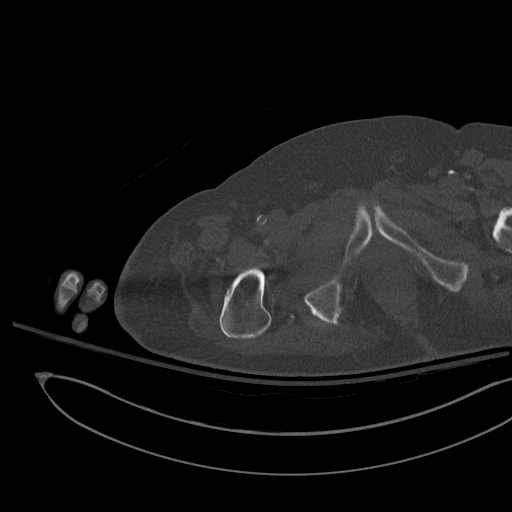

[Series 8: sagittalsoft tissue · coronal · 0.91mm/px · 3 of 95 slices shown]
[im 19/95  bone]
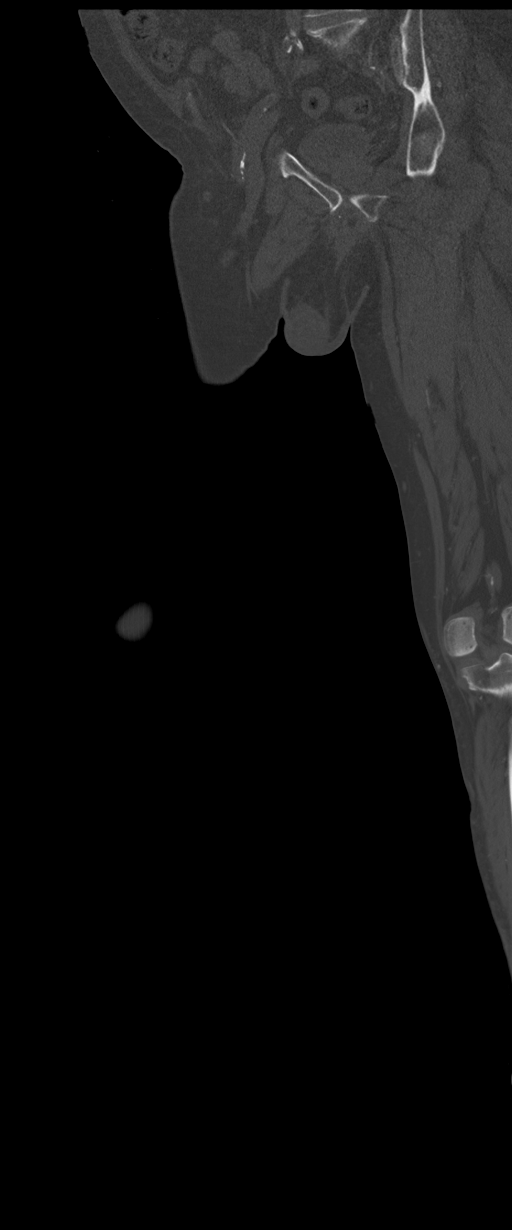
[im 38/95  bone]
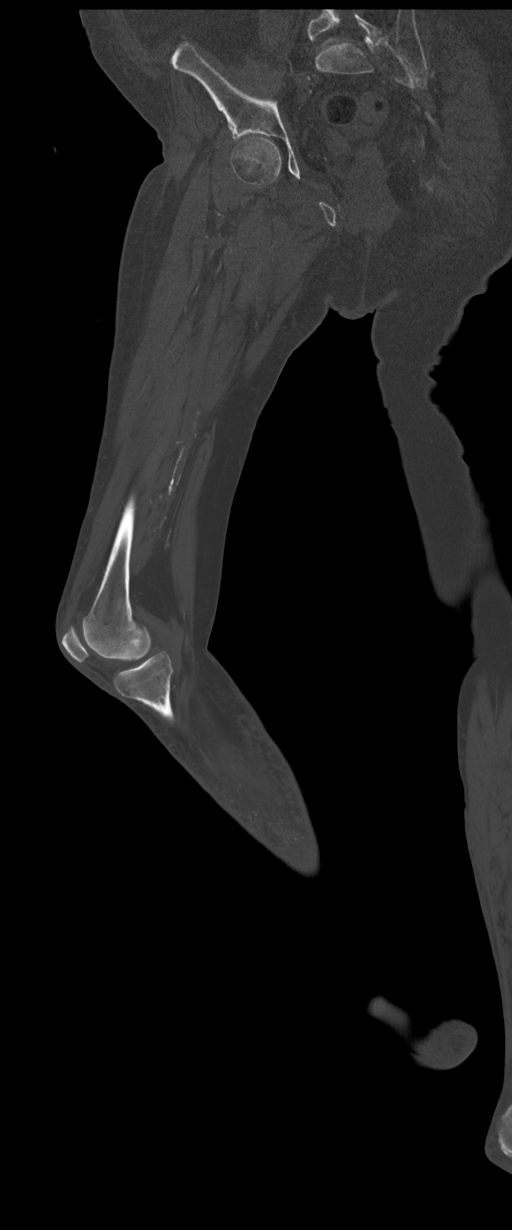
[im 57/95  bone]
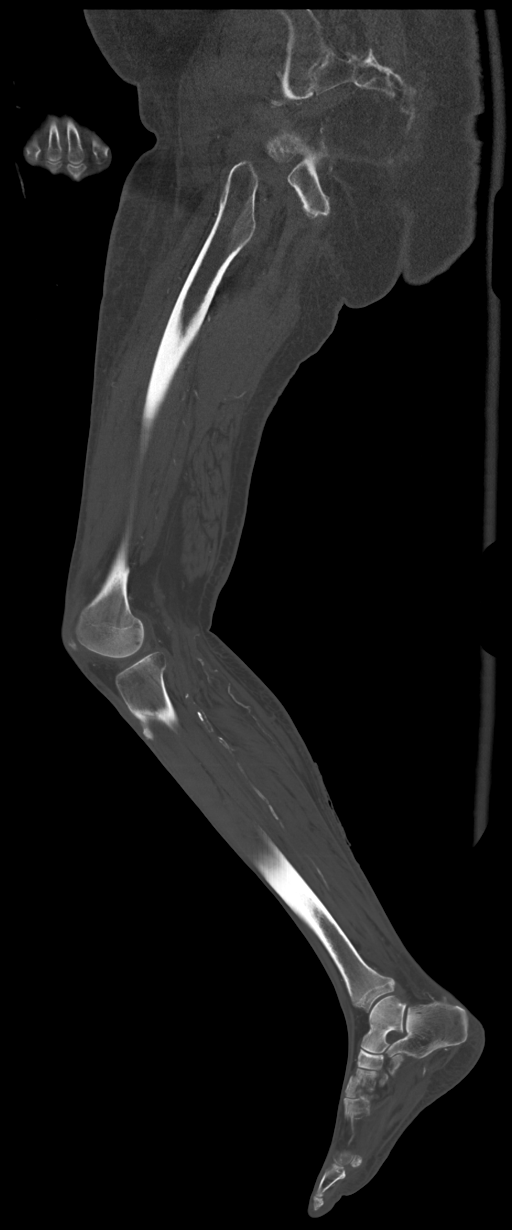

[Series 11: coronal bone · sagittal · 0.55mm/px · 2 of 159 slices shown]
[im 53/159  bone]
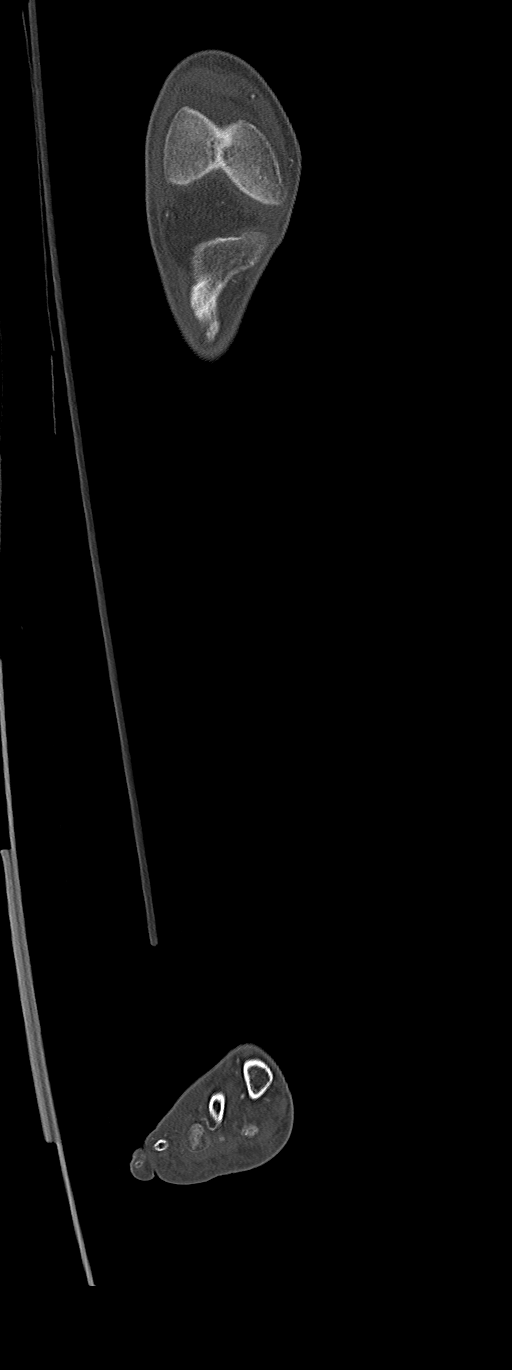
[im 106/159  bone]
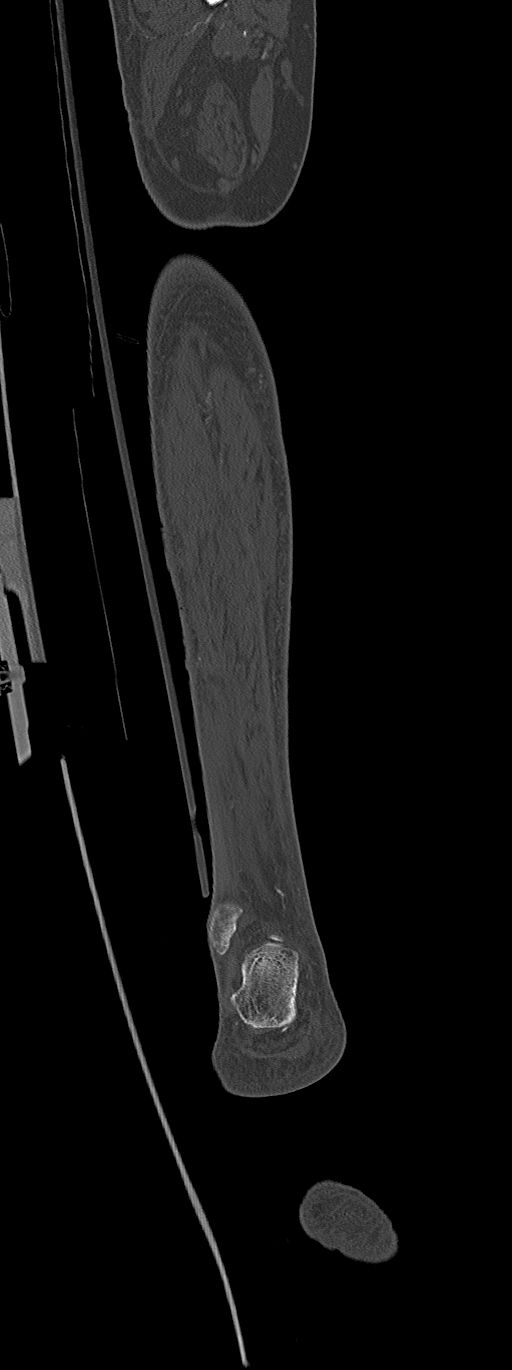

[10 of 33 positions shown; findings below may reference images not displayed]

FINDINGS: Bones/Joint/Cartilage

No osteomyelitis seen, low sensitivity in assessing the toes. No
definite bony destructive findings. Mild osteoarthritis of the knee.
Trace knee effusion.

Ligaments

Not assessed by CT.

Muscles and Tendons

No appreciable drainable abscess is observed on today's noncontrast
assessment.

Soft tissues

Subcutaneous edema most notable along the proximal calf, with a
suggestion of some skin disruption posteriorly along the mid calf. I
do not see a foreign body. There may be some very minimal
subcutaneous gas adjacent to the skin disruption for example on
image 258/4.

Notable arterial atherosclerotic calcification. Mild subcutaneous
edema tracking along the dorsum of the foot.
IMPRESSION: 1. No findings of abscess or osteomyelitis.
2. Skin disruption along the posterior calf with some confluent
regional subcutaneous edema potentially reflecting cellulitis.
3. Notable atherosclerotic calcification.
4. Dorsal subcutaneous edema in the foot, nonspecific.

## 2019-06-16 IMAGING — DX DG CHEST 1V
1 series · 1 of 1 positions shown · non-contrast
Comparison: 01/14/2018

CLINICAL DATA: Post thoracentesis

EXAM:
CHEST  1 VIEW

[x chest ap]
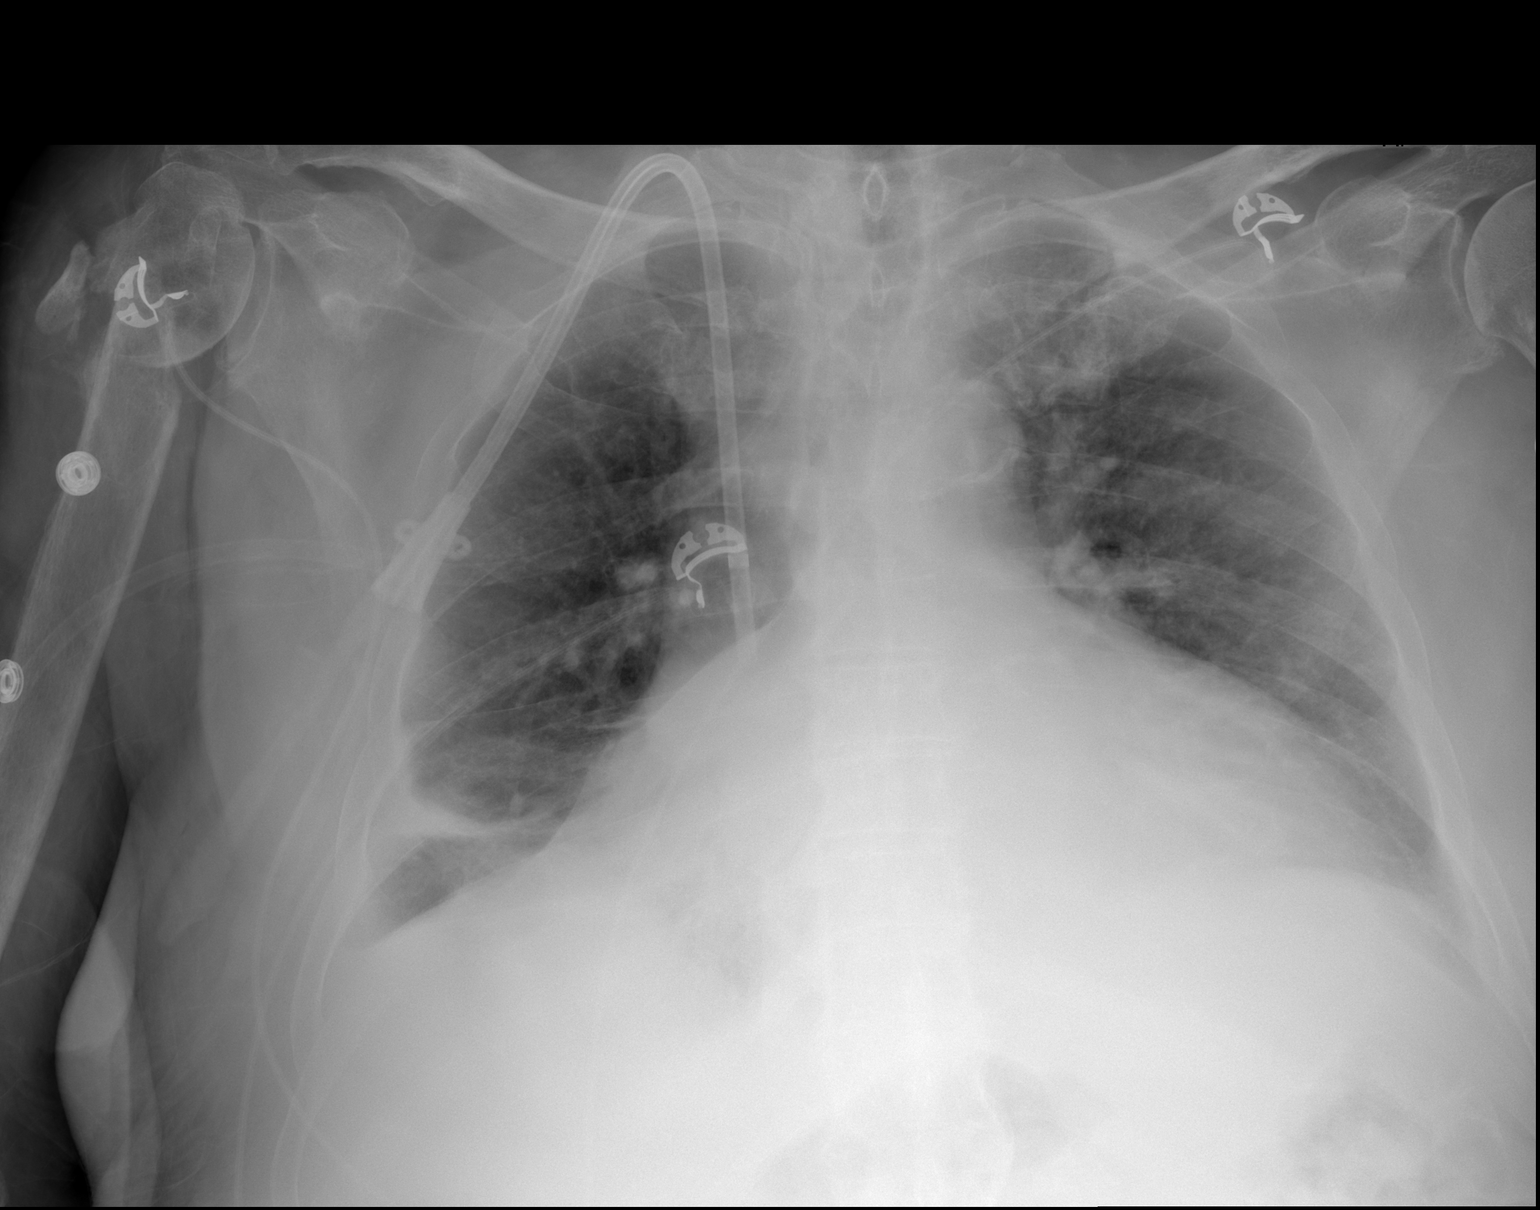

[1 of 1 positions shown; findings below may reference images not displayed]

FINDINGS: RIGHT jugular dual-lumen central venous catheter with tip projecting
over SVC.

Enlargement of cardiac silhouette with pulmonary vascular
congestion.

Decreased RIGHT pleural effusion and basilar atelectasis since
previous exam.

No pneumothorax post thoracentesis.

Bones demineralized with old posttraumatic deformity of the proximal
RIGHT humerus.

Atherosclerotic calcification aortic arch.
IMPRESSION: Decreased RIGHT pleural effusion and basilar atelectasis post
thoracentesis without pneumothorax.

Enlargement of cardiac silhouette with pulmonary vascular
congestion.

## 2019-06-16 IMAGING — DX DG FEMUR 2+V*L*
2 series · 2 of 2 positions shown · non-contrast
Comparison: None

CLINICAL DATA: Distal femoral pain

EXAM:
LEFT FEMUR 2 VIEWS

[x femur distal lat left]
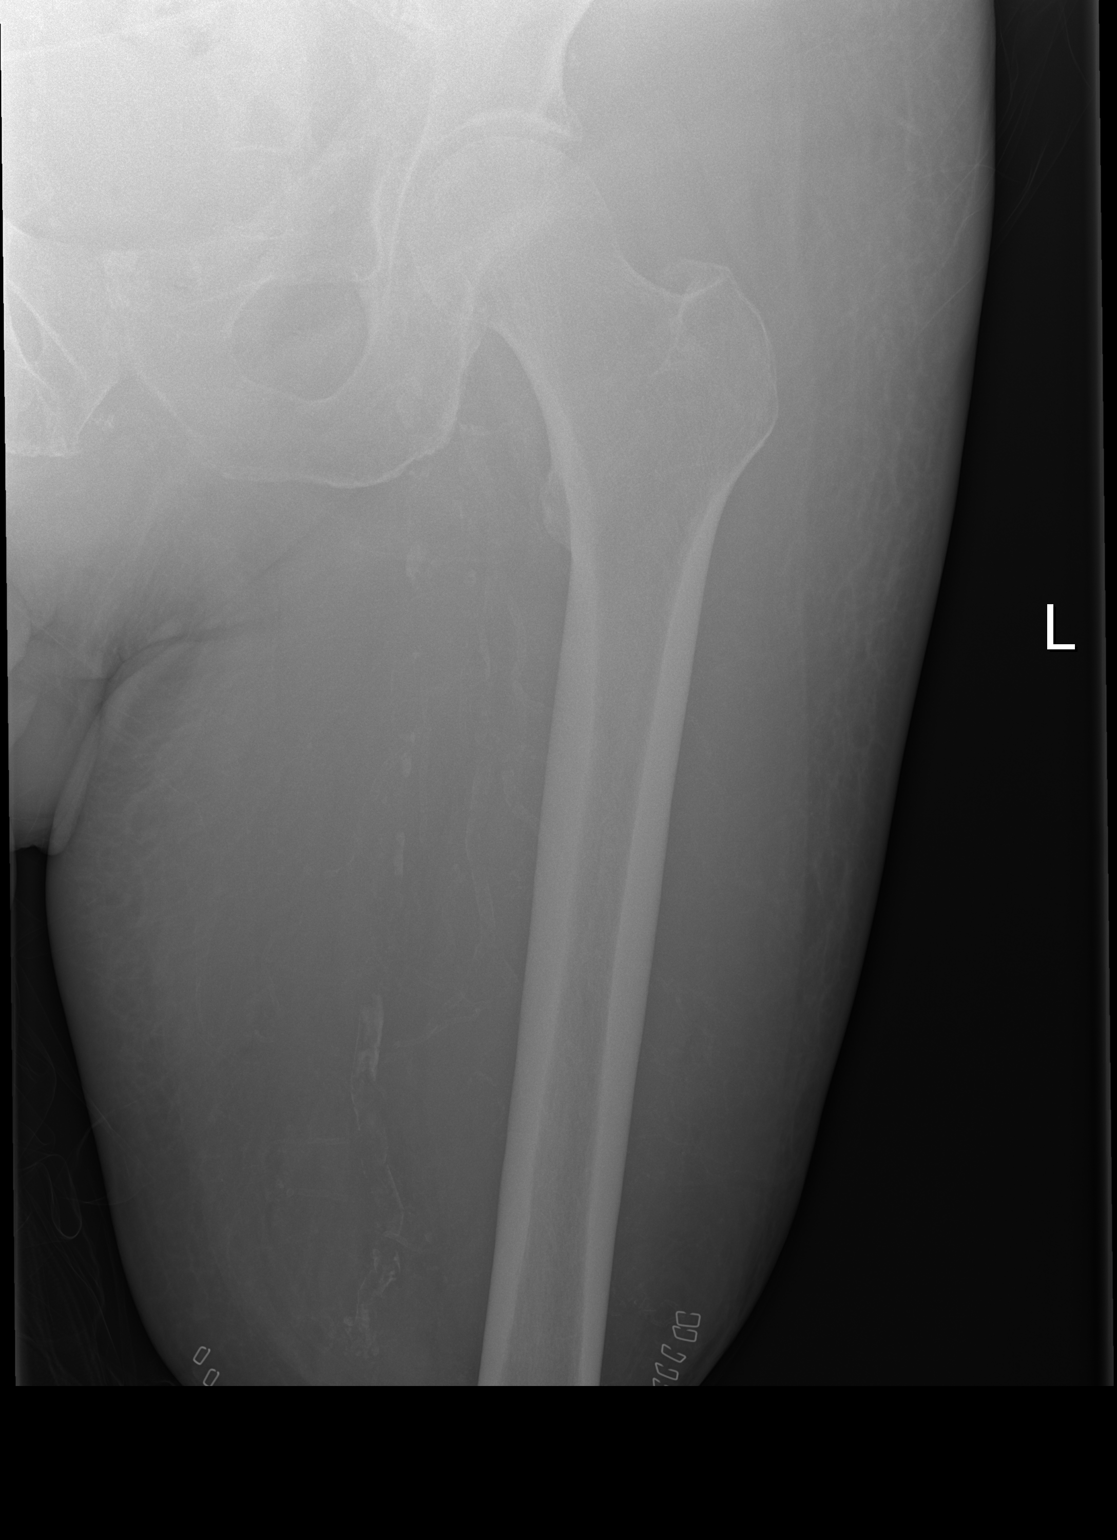

[x femur distal ap left]
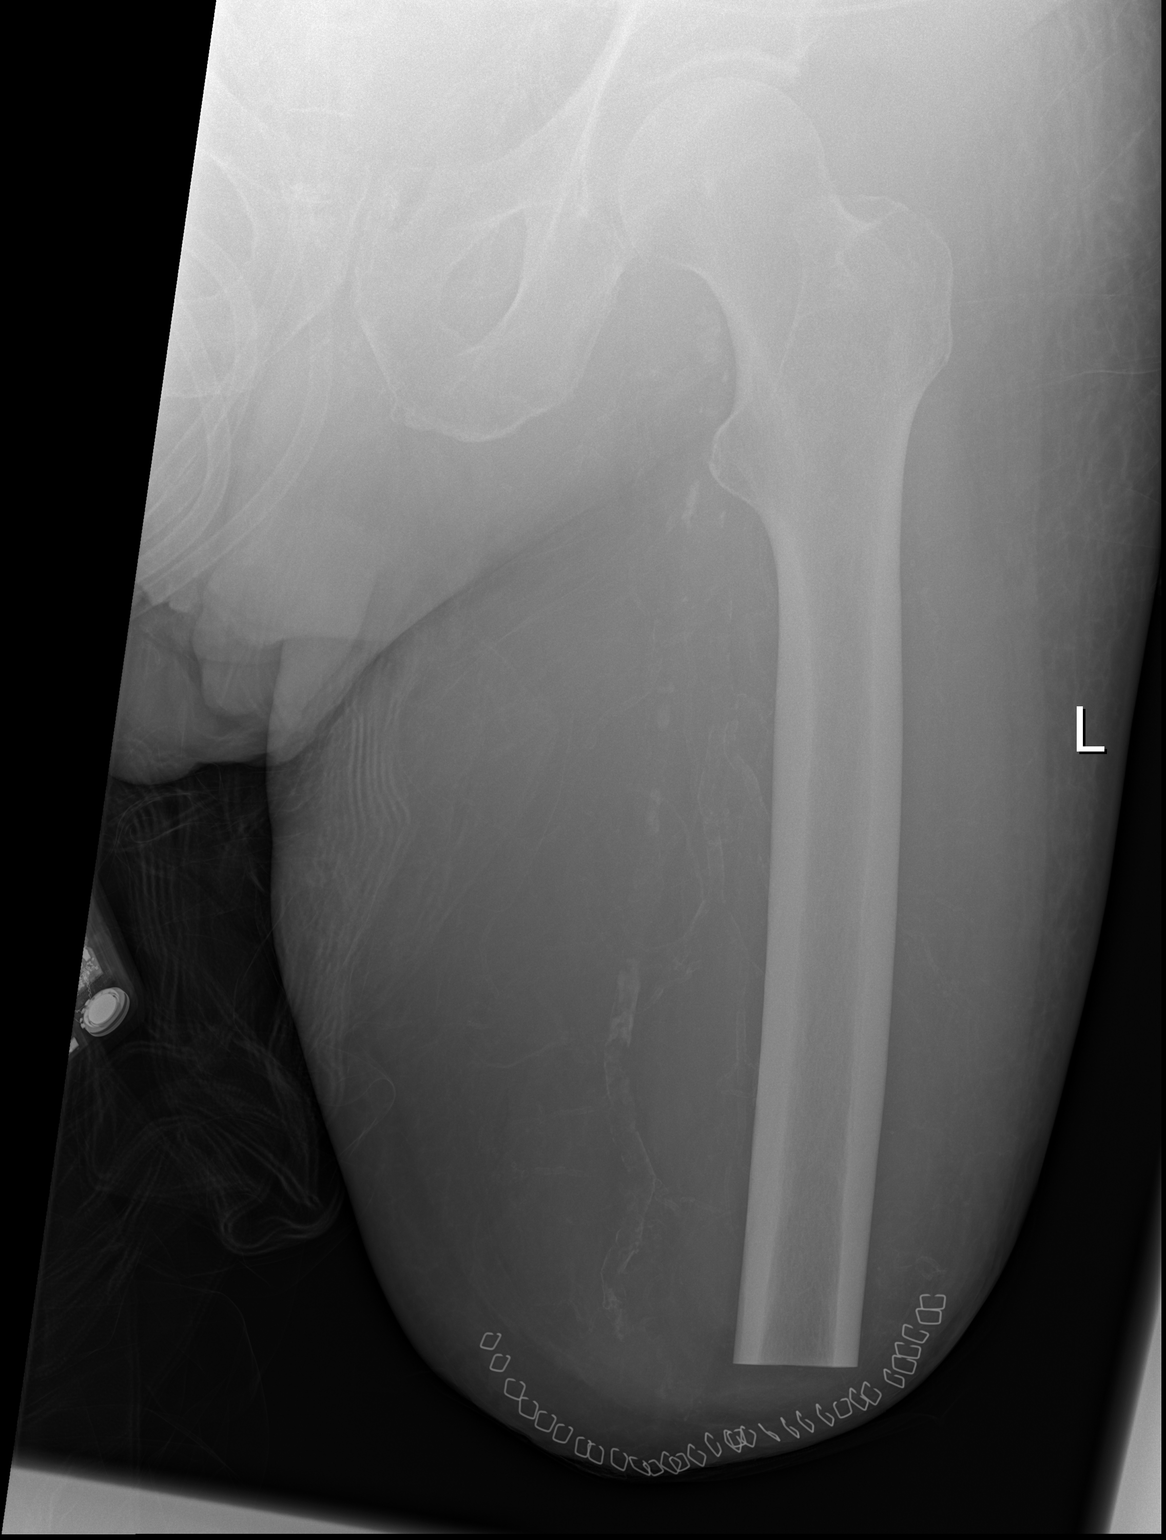

[2 of 2 positions shown; findings below may reference images not displayed]

FINDINGS: Prior above knee amputation.

Osseous mineralization normal.

No fracture, dislocation, or bone destruction.

Skin clips at stump.

Scattered vascular calcifications.
IMPRESSION: Post above knee amputation.

No acute abnormalities.
